# Patient Record
Sex: Male | Born: 1955 | Race: White | Hispanic: No | Marital: Married | Smoking: Former smoker
Health system: Southern US, Community
[De-identification: ages and names within clinical notes are randomized; demographics above are authoritative.]

## PROBLEM LIST (undated history)

## (undated) ENCOUNTER — Emergency Department: Discharge status: Absent without leave | Payer: Medicare HMO

## (undated) DIAGNOSIS — R091 Pleurisy: Secondary | ICD-10-CM

## (undated) DIAGNOSIS — I2510 Atherosclerotic heart disease of native coronary artery without angina pectoris: Secondary | ICD-10-CM

## (undated) DIAGNOSIS — K449 Diaphragmatic hernia without obstruction or gangrene: Secondary | ICD-10-CM

## (undated) DIAGNOSIS — I10 Essential (primary) hypertension: Secondary | ICD-10-CM

## (undated) DIAGNOSIS — E785 Hyperlipidemia, unspecified: Secondary | ICD-10-CM

## (undated) DIAGNOSIS — F1011 Alcohol abuse, in remission: Secondary | ICD-10-CM

## (undated) DIAGNOSIS — T1491XA Suicide attempt, initial encounter: Secondary | ICD-10-CM

## (undated) DIAGNOSIS — R569 Unspecified convulsions: Secondary | ICD-10-CM

## (undated) DIAGNOSIS — I219 Acute myocardial infarction, unspecified: Secondary | ICD-10-CM

## (undated) DIAGNOSIS — F209 Schizophrenia, unspecified: Secondary | ICD-10-CM

## (undated) DIAGNOSIS — D649 Anemia, unspecified: Secondary | ICD-10-CM

## (undated) DIAGNOSIS — J449 Chronic obstructive pulmonary disease, unspecified: Secondary | ICD-10-CM

## (undated) DIAGNOSIS — K635 Polyp of colon: Secondary | ICD-10-CM

## (undated) HISTORY — PX: CORONARY ANGIOPLASTY WITH STENT PLACEMENT: SHX49

## (undated) HISTORY — PX: NOSE SURGERY: SHX723

## (undated) HISTORY — PX: COLONOSCOPY: SHX174

## (undated) HISTORY — PX: EYE SURGERY: SHX253

## (undated) HISTORY — PX: GLAUCOMA SURGERY: SHX656

## (undated) HISTORY — PX: ABDOMINAL SURGERY: SHX537

## (undated) HISTORY — PX: TOE AMPUTATION: SHX809

## (undated) HISTORY — PX: VASECTOMY: SHX75

---

## 2003-08-12 DIAGNOSIS — I219 Acute myocardial infarction, unspecified: Secondary | ICD-10-CM

## 2003-08-12 HISTORY — DX: Acute myocardial infarction, unspecified: I21.9

## 2004-11-29 ENCOUNTER — Emergency Department: Payer: Self-pay | Admitting: Emergency Medicine

## 2004-12-03 ENCOUNTER — Emergency Department: Payer: Self-pay | Admitting: Emergency Medicine

## 2005-05-21 ENCOUNTER — Emergency Department: Payer: Self-pay | Admitting: Emergency Medicine

## 2005-08-27 ENCOUNTER — Emergency Department: Payer: Self-pay | Admitting: Emergency Medicine

## 2005-08-31 ENCOUNTER — Inpatient Hospital Stay: Payer: Self-pay | Admitting: Otolaryngology

## 2005-10-20 ENCOUNTER — Emergency Department: Payer: Self-pay | Admitting: Emergency Medicine

## 2006-08-04 ENCOUNTER — Inpatient Hospital Stay: Payer: Self-pay | Admitting: Psychiatry

## 2006-09-11 ENCOUNTER — Inpatient Hospital Stay: Payer: Self-pay | Admitting: Internal Medicine

## 2006-09-11 ENCOUNTER — Emergency Department: Payer: Self-pay | Admitting: Emergency Medicine

## 2006-09-11 ENCOUNTER — Other Ambulatory Visit: Payer: Self-pay

## 2006-11-07 ENCOUNTER — Other Ambulatory Visit: Payer: Self-pay

## 2006-11-07 ENCOUNTER — Emergency Department: Payer: Self-pay | Admitting: Emergency Medicine

## 2007-03-09 ENCOUNTER — Emergency Department: Payer: Self-pay | Admitting: Emergency Medicine

## 2007-03-09 ENCOUNTER — Other Ambulatory Visit: Payer: Self-pay

## 2007-05-06 ENCOUNTER — Inpatient Hospital Stay: Payer: Self-pay | Admitting: Cardiovascular Disease

## 2007-05-06 ENCOUNTER — Other Ambulatory Visit: Payer: Self-pay

## 2007-05-07 ENCOUNTER — Other Ambulatory Visit: Payer: Self-pay

## 2007-08-08 ENCOUNTER — Emergency Department: Payer: Self-pay | Admitting: Emergency Medicine

## 2007-08-18 ENCOUNTER — Emergency Department: Payer: Self-pay | Admitting: Emergency Medicine

## 2007-09-04 ENCOUNTER — Emergency Department: Payer: Self-pay | Admitting: Emergency Medicine

## 2007-09-06 ENCOUNTER — Inpatient Hospital Stay: Payer: Self-pay | Admitting: Unknown Physician Specialty

## 2007-09-09 ENCOUNTER — Other Ambulatory Visit: Payer: Self-pay

## 2007-09-13 ENCOUNTER — Inpatient Hospital Stay: Payer: Self-pay | Admitting: Internal Medicine

## 2007-09-13 ENCOUNTER — Other Ambulatory Visit: Payer: Self-pay

## 2007-11-23 ENCOUNTER — Emergency Department: Payer: Self-pay | Admitting: Emergency Medicine

## 2007-12-11 ENCOUNTER — Inpatient Hospital Stay: Payer: Self-pay | Admitting: Internal Medicine

## 2007-12-11 ENCOUNTER — Other Ambulatory Visit: Payer: Self-pay

## 2007-12-24 ENCOUNTER — Emergency Department: Payer: Self-pay | Admitting: Emergency Medicine

## 2007-12-27 ENCOUNTER — Emergency Department: Payer: Self-pay | Admitting: Emergency Medicine

## 2008-01-20 ENCOUNTER — Emergency Department: Payer: Self-pay | Admitting: Emergency Medicine

## 2008-01-20 ENCOUNTER — Other Ambulatory Visit: Payer: Self-pay

## 2008-02-05 ENCOUNTER — Emergency Department: Payer: Self-pay | Admitting: Emergency Medicine

## 2008-02-05 ENCOUNTER — Other Ambulatory Visit: Payer: Self-pay

## 2008-02-08 ENCOUNTER — Other Ambulatory Visit: Payer: Self-pay

## 2008-02-08 ENCOUNTER — Inpatient Hospital Stay: Payer: Self-pay | Admitting: Internal Medicine

## 2008-04-13 ENCOUNTER — Emergency Department: Payer: Self-pay | Admitting: Emergency Medicine

## 2008-04-13 ENCOUNTER — Other Ambulatory Visit: Payer: Self-pay

## 2008-05-24 ENCOUNTER — Emergency Department: Payer: Self-pay | Admitting: Emergency Medicine

## 2008-06-06 ENCOUNTER — Emergency Department: Payer: Self-pay | Admitting: Emergency Medicine

## 2008-06-10 ENCOUNTER — Inpatient Hospital Stay: Payer: Self-pay | Admitting: Internal Medicine

## 2009-01-16 ENCOUNTER — Emergency Department: Payer: Self-pay | Admitting: Internal Medicine

## 2009-07-20 ENCOUNTER — Ambulatory Visit: Payer: Self-pay | Admitting: Cardiovascular Disease

## 2009-08-31 ENCOUNTER — Inpatient Hospital Stay: Payer: Self-pay | Admitting: Psychiatry

## 2009-10-02 ENCOUNTER — Inpatient Hospital Stay: Payer: Self-pay | Admitting: Psychiatry

## 2009-10-27 ENCOUNTER — Inpatient Hospital Stay: Payer: Self-pay | Admitting: Internal Medicine

## 2009-11-05 ENCOUNTER — Emergency Department: Payer: Self-pay | Admitting: Emergency Medicine

## 2009-11-14 ENCOUNTER — Emergency Department: Payer: Self-pay | Admitting: Unknown Physician Specialty

## 2010-12-17 ENCOUNTER — Ambulatory Visit: Payer: Self-pay | Admitting: Emergency Medicine

## 2010-12-23 LAB — PATHOLOGY REPORT

## 2014-08-21 ENCOUNTER — Encounter: Payer: Self-pay | Admitting: Surgery

## 2014-08-21 ENCOUNTER — Ambulatory Visit: Payer: Self-pay | Admitting: Surgery

## 2014-09-11 ENCOUNTER — Ambulatory Visit: Payer: Self-pay | Admitting: Surgery

## 2014-09-11 LAB — HEMOGLOBIN A1C: Hemoglobin A1C: 6.3 % (ref 4.2–6.3)

## 2014-09-21 ENCOUNTER — Ambulatory Visit: Payer: Self-pay | Admitting: Surgery

## 2014-09-23 ENCOUNTER — Inpatient Hospital Stay: Payer: Self-pay | Admitting: Internal Medicine

## 2014-10-05 ENCOUNTER — Other Ambulatory Visit: Payer: Self-pay

## 2014-10-12 ENCOUNTER — Other Ambulatory Visit: Payer: Self-pay

## 2014-11-10 ENCOUNTER — Encounter: Payer: Self-pay | Admitting: Surgery

## 2014-11-17 DIAGNOSIS — IMO0002 Reserved for concepts with insufficient information to code with codable children: Secondary | ICD-10-CM | POA: Insufficient documentation

## 2014-12-04 LAB — SURGICAL PATHOLOGY

## 2014-12-10 NOTE — Consult Note (Signed)
Admit Diagnosis:   ACUTE OSTEOMYELITIS OF LT FOOT: Onset Date: 24-Sep-2014, Status: Active, Description: ACUTE OSTEOMYELITIS OF LT FOOT    GERD - Esophageal Reflux:    rectal bleeding:    Hypercholesterolemia:    COPD:    Other, see comments: allergies   Atrial Fibrillation:    Schizophrenia:    Anxiety:    Depression:    HTN:    cyst on neck:    stent:    eye surgery - right:    nasal surgery:   Home Medications: Medication Instructions Status  famotidine 20 mg tablet 1 tab(s) orally 2 times a day x 30 days  Active  Geodon capsule 80 mg 2 cap(s)  once a day (at bedtime) Active  clozapine tablet 100 mg 3 tab(s) orally once a day (at bedtime)  Active  Protonix enteric coated tablet 40 mg 1 tab(s) orally once a day (at bedtime)  Active  Paroxetine HCL 40mg  PO at bedtime    Active  Simvastatin 40mg  PO at bedtime  Active  Diltiazem HLC ER 360mg  PO daily  Active  Lisinopril 20mg  PO q AM  Active  ASA 325mg  EC PO daily  Active  Carvedilol 25mg  PO BID  Active  traMADol 50 mg oral tablet 2 tab(s) orally once a day (at bedtime) Active  nicotine 21 mg/24 hr transdermal film, extended release 1 patch transdermal once a day Active   Lab Results: Routine Micro:  13-Feb-16 19:41   Micro Text Report BLOOD CULTURE   COMMENT                   NO GROWTH IN 8-12 HOURS   ANTIBIOTIC                       Micro Text Report BLOOD CULTURE   COMMENT                   NO GROWTH IN 8-12 HOURS   ANTIBIOTIC                       Routine Chem:  14-Feb-16 03:46   Glucose, Serum 98  BUN 12  Creatinine (comp)  1.57  Potassium, Serum 3.8  Routine Hem:  13-Feb-16 19:25   WBC (CBC)  25.2  14-Feb-16 03:46   WBC (CBC)  19.0  Hemoglobin (CBC)  8.8  Hematocrit (CBC)  27.4  Neutrophil % 87.5  Neutrophil #  16.7   Radiology Results:  Radiology Results: XRay:    13-Feb-16 19:45, Foot Left Complete  Foot Left Complete  REASON FOR EXAM:    left 1-3 toe infection  COMMENTS:    Bedside (portable):Y    PROCEDURE: DXR - DXR FOOT LT COMP W/OBLIQUES  - Sep 23 2014  7:45PM     CLINICAL DATA:  Swelling, discoloration and drainage from the first  through third digits.    EXAM:  LEFT FOOT - COMPLETE 3+ VIEW    COMPARISON:  MRI 09/21/2014    FINDINGS:  There is bony destruction involving the second middle phalanx and  distal portion of the proximal phalanx with frank destruction of the  PIP joint. The findings are typical of osteomyelitis and septic  arthritis and are consistent with the MR findings from 09/21/2014.  No radiopaque foreign body is evident. No new areas of involvement  are evident compared to the recent MRI. Chronic hypertrophy and  degeneration of the first MTP joint incidentally noted.  There is marked soft tissue swelling at the dorsum of the forefoot.  There is no soft tissue gas.     IMPRESSION:  Osteomyelitis and septic arthritis, second PIP.      Electronically Signed    By: Judie Grieve.D.    On: 09/23/2014 19:57         Verified By: Ellery Plunk, M.D.,  MRI:    11-Feb-16 16:13, MRI Foot Left WWO  MRI Foot Left WWO  REASON FOR EXAM:    swelling of toes erythema  non healing wounds    exposed bone  COMMENTS:       PROCEDURE: MR  - MR FOOT LT  WO/W CONTRAST  - Sep 21 2014  4:13PM     CLINICAL DATA:  No known injury. Open sore on second toe and a small  sore on thegreat toe. Generalized pain for 5 months.    EXAM:  MRI OF THE LEFT FOREFOOT WITHOUT AND WITH CONTRAST    TECHNIQUE:  Multiplanar, multisequence MR imaging was performed both before and  after administration of intravenous contrast.  CONTRAST:  18 mLMultiHance.    COMPARISON:  None.    FINDINGS:  There is severe soft tissue edema with enhancement surrounding the  second toe. There is bone destruction with severe edema and  enhancement involving the second proximal and middle phalanx with  destructive changes of the second PIP joint. There is  mild marrow  edema in the second distal phalanx which is likely reactive without  bone destruction.    There is no focal marrow signal abnormality of the first phalanx.  There is no bone destruction. There is no joint effusion. There is  partial thickness cartilage loss with subchondral cystic changes  involving the lateral tibial plafond. 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 There is mild osteoarthritis  at the navicular -medialcuneiform articulation. There is no other  focal marrow signal abnormality. There is no fracture or  dislocation. The sinus tarsi and visualized subtalar joints are  normal.    The muscles are normal in signal. The visualized flexor, extensor  and peroneal tendons are intact.     IMPRESSION:  1. Severe cellulitis of the second toe of the left foot with  osteomyelitis of the second proximal and middle phalanx and septic  arthritis of the second PIP joint.  2. No osteomyelitis of the great toe.  Electronically Signed    By: Elige Ko    On: 09/22/2014 09:20         Verified By: Joellyn Haff, M.D., MD    Compazine: Other  Depakote: Other  Nursing Flowsheets: **Vital Signs.:   14-Feb-16 16:16  Temperature Temperature (F) 97.3    General Aspect Pt admitted after syncopal episode.  Has had a couple of day of chills.  C/O pain to left 2nd toe.  Seen outpt at podiatry and wound care center.  Worsening swelling to 2nd toe.  Upon admission has noted ulceration and xrays and MRI show osteomyelitis of left 2nd toe.   Case History and Physical Exam:  Cardiovascular Palpable DP and PT pulsed b/l   Musculoskeletal Right foot without issue.  Left 2nd toe with edema and instability at PIPJ. Pain with palpation.  Mild edema to left foot.   Neurological Grossly neuropathic   Skin Ulcer 2nd toe with drainage lateral at PIPJ.  Diffuse cellulitis dorsal from 2nd toe to MPJ.  No lymphangitis.    Impression Osteomyelitis 2nd toe  secondary  to diabetic neuropathy and diabetic ulceration.   Plan Will need amputation of 2nd toe. D/W pt and will plan for tomorrow. NPO after midnight.   Electronic Signatures: Gwyneth Revels (MD)  (Signed 14-Feb-16 17:25)  Authored: Health Issues, Significant Events - History, Home Medications, Labs, Radiology Results, Allergies, Vital Signs, General Aspect/Present Illness, History and Physical Exam, Impression/Plan   Last Updated: 14-Feb-16 17:25 by Gwyneth Revels (MD)

## 2014-12-10 NOTE — H&P (Signed)
PATIENT NAME:  Brandon [name], Brandon [name] MR#:  [id number] DATE OF BIRTH:  [birth year]  DATE OF ADMISSION:  09/23/2014  PRIMARY CARE PHYSICIAN: Marland Mcalpine A. Tejan-Sie, MD  CHIEF COMPLAINT: "Passed out."  HISTORY OF PRESENT ILLNESS: A 59 year old Caucasian gentleman with history of COPD, non-O2 dependent; atrial fibrillation, paroxysmal, not on any anticoagulation; coronary artery disease, status post PCI and stent placement, presenting after a syncopal episode. The patient states he was at church and went from a sitting to standing position and felt light-headed, followed by loss of consciousness (brief episode). No postictal confusion. No loss of bowel or bladder function. This was witnessed, and then he returned back to his usual state of health. Of note, he has been recently following at the wound clinic for about 1 to 2 weeks in total for a left foot wound. He noticed recent worsening with increased redness, edema, as well as purulent drainage from the second toe, as well as subjective chills. Denies any fevers.   REVIEW OF SYSTEMS:  CONSTITUTIONAL: Denies fever. Positive for chills, fatigue, weakness.  EYES: Denies blurred vision, double vision, or eye pain.  EARS, NOSE, THROAT: Denies tinnitus, ear pain, hearing loss.  RESPIRATORY: Denies cough, wheeze, shortness of breath.  CARDIOVASCULAR: Denies chest pain or palpitations. Positive for syncopal episode as described above.  GASTROINTESTINAL: Denies nausea, vomiting, diarrhea, or abdominal pain.  GENITOURINARY: Denies dysuria or hematuria.  ENDOCRINE: Denies nocturia or thyroid problems.  HEMATOLOGIC AND LYMPHATIC: Denies easy bruising or bleeding.  SKIN: Positive for an erythematous lesion surrounding the second and third toes extending to about half way up the dorsum of the left foot as stated above with purulent discharge. Otherwise, no further rashes or lesions.  MUSCULOSKELETAL: Denies pain in neck, back, shoulder, knees, hips, or arthritic symptoms.   NEUROLOGIC: Denies paralysis or paresthesias.  PSYCHIATRIC: Denies anxiety or depressive symptoms.   Otherwise, full review of systems performed by me is negative.   PAST MEDICAL HISTORY: Includes COPD, non-O2 dependent; hyperlipidemia, unspecified; paroxysmal atrial fibrillation, not on anticoagulation; schizophrenia, anxiety, depression, not otherwise specified; essential hypertension; coronary artery disease, status post PCI and stent placement.   SOCIAL HISTORY: Remote tobacco use. Denies any alcohol or drug use.   FAMILY HISTORY: Positive for diabetes as well as hypertension.   ALLERGIES: COMPAZINE AND DEPAKOTE.   HOME MEDICATIONS: Include clozapine 100 mg 3 tablets p.o. at bedtime, Pepcid 20 mg p.o. b.i.d., Protonix 40 mg p.o. at bedtime, aspirin 325 mg p.o. daily, Coreg 25 mg p.o. b.i.d., diltiazem extended release 360 mg p.o. daily, Feosol 325 mg p.o. b.i.d., Geodon 80 mg p.o. b.i.d., lisinopril 20 mg p.o. daily, meloxicam 15 mg p.o. daily, paroxetine 40 mg p.o. at bedtime, as well as simvastatin 40 mg p.o. at bedtime.   PHYSICAL EXAMINATION:  VITAL SIGNS: Temperature 98.5, heart rate 83, respirations 18, blood pressure 96/62, saturating 100% on room air. Weight 86.2 kg, BMI 25.8.  GENERAL: Well-nourished, well-developed Caucasian gentleman currently in no acute distress.  HEAD: Normocephalic, atraumatic.  EYES: Pupils are equal, round, and reactive to light. Extraocular muscles intact. No scleral icterus.  MOUTH: Dry mucosal membrane. Dentition intact. No abscess noted.  EARS, NOSE, THROAT: Clear without exudates. No external lesions.  NECK: Supple. No thyromegaly. No nodules. No JVD.  PULMONARY: Clear to auscultation bilaterally without wheezes, rubs, or rhonchi. No use of accessory muscles. Good respiratory effort.  CHEST: Nontender to palpation.  CARDIOVASCULAR: S1, S2, regular rate and rhythm. No murmurs, rubs, or gallops. No edema. Pedal pulses 2+ bilaterally.  GASTROINTESTINAL: Soft, nontender, nondistended. No masses. Positive bowel sounds. No hepatosplenomegaly.  MUSCULOSKELETAL: Left foot starting at the second digit moving proximally about half way up the dorsum of the left foot reveals edema with associated erythema. Warm to touch. Otherwise, range of motion is intact. No clubbing or further abnormalities.  NEUROLOGIC: Cranial nerves II through XII intact. No gross focal neurological deficits. Sensation intact. Reflexes intact.  SKIN: Second digit of the left toe with gross deformity with purulent discharge. The medial portion of the left toe has surrounding erythema and edema as stated above, including the second and third digit moving proximally about half of the dorsum of the foot. Otherwise, no further lesions. Skin turgor intact.  PSYCHIATRIC: Mood and affect within normal limits. The patient is awake, alert, oriented x 3. Insight and judgment intact.   LABORATORY DATA: The MRI which was performed on September 21, 2014, reveals severe cellulitis of the second toe of the left foot with osteomyelitis of the second proximal and mid phalanx and septic arthritis of the second PIP joint. Remainder of laboratory data: Sodium 129, potassium 3.9, chloride 99, bicarbonate of 19, anion gap corrected to 14.75, BUN 16, creatinine of 2, glucose of 89. Hemoglobin A1c performed on February 1 was 6.3. LFTs: Protein of 6.2, albumin 2.5. WBC of 25.2, hemoglobin 9.3, platelets of 387,000. Lactic acid 1.6.   ASSESSMENT AND PLAN: A 59 year old Caucasian gentleman with a history of chronic obstructive pulmonary disease, non-oxygen dependent; atrial fibrillation, paroxysmal, not on anticoagulation; coronary artery disease, status post percutaneous coronary intervention and stent placement, presenting after a syncopal episode.  1.  Osteomyelitis, left foot, acute. Apparently, he is on some p.o. antibiotics at home. Unsure of what these are. Regardless, it appears to be  worsening. Initiate blood cultures. Intravenous antibiotic coverage with vancomycin and Zosyn. His hemoglobin A1c was recently checked at 6.3. We will consult podiatry for either debridement versus amputation.  2.  Syncope. Intravenous fluid hydration, place on telemetry.  3.  Acute kidney injury, unknown baseline. Regardless, intravenous fluid hydration. Follow urine output and renal function.  4.  Hyponatremia: Intravenous fluids--thus far received about 3.5 liters of normal saline. Regardless, we will continue intravenous fluid hydration for blood pressure support and follow sodium levels accordingly.  5.  Atrial fibrillation, paroxysmal, currently in normal sinus rhythm. Continue his beta blockade and calcium channel blockers.  6.  Hypertension, essential. Hold his angiotensin-converting enzyme inhibitor given acute kidney injury. 7.  Venous thromboembolism prophylaxis with sequential compression devices. Hold heparin in preparation for possible surgery.   CODE STATUS: The patient is a FULL CODE.   TIME SPENT: 45 minutes.     ____________________________ Cletis Athensavid K. Hower, MD dkh:ts D: 09/23/2014 21:08:00 ET T: 09/23/2014 21:39:45 ET JOB#: [id number]  cc: Cletis Athensavid K. Hower, MD, <Dictator> DAVID Synetta ShadowK HOWER MD ELECTRONICALLY SIGNED 09/24/2014 20:44

## 2014-12-10 NOTE — Op Note (Signed)
PATIENT NAME:  Brandon [name], Brandon [name] MR#:  [id number] DATE OF BIRTH:  1956-01-21  DATE OF PROCEDURE:  09/25/2014   PREOPERATIVE DIAGNOSIS:  Left second toe osteomyelitis.   POSTOPERATIVE DIAGNOSIS: Left second toe osteomyelitis.   PROCEDURE: Amputation left second toe metatarsophalangeal joint.   SURGEON: Yoniel Arkwright A. Ether GriffinsFowler, DPM.   ANESTHESIA: IV sedation with local.   HEMOSTASIS: None.   COMPLICATIONS: None.   SPECIMEN: Infected osteomyelitis, left second toe.   OPERATIVE INDICATIONS: This is a 59 year old gentleman with an ulcer to his left second toe with a draining abscess from the PIPJ with osteomyelitis on both an x-ray and MRI.  He was brought from the orthopedic floor to the OR for surgical amputation of the left second toe.  The risks, benefits, alternatives, and complications associated with surgery were discussed with the patient and full consent has been given.   OPERATIVE PROCEDURE: The patient was brought into the OR and placed on the operating table in the supine position. IV sedation was administered by the anesthesia team. A local block was placed around the second toe joint by myself.  After sterile prep and drape, attention was directed to the second MTPJ where 2 semielliptical incisions were made circumferential around the second toe itself.  A full-thickness incision was taken down to the MTPJ. The toe was then disarticulated at the MTPJ.  Inspection of the metatarsal head did not reveal any further erosive changes to the second toe metatarsal.  The cartilage cap was intact. No surrounding necrotic tissue was noted at this time. The wound was then flushed with copious amounts of irrigation. Layered closure was performed with a 4-0 Vicryl for the deeper layer and a 3-0 nylon for skin. A central portion of the wound was packed with Iodoform packing strip.  A 0.5% Marcaine block was placed around all areas. The patient tolerated the procedure and anesthesia well and was transported  from the OR to the PACU with all vital signs stable and neurovascular status intact. He will be readmitted to the floor. We will monitor the surgical site. He is on IV antibiotics by internal medicine.  Hopefully, I can discharge him in the next 1 to 2 days if he stabilizes.     ____________________________ Argentina DonovanJustin A. Ether GriffinsFowler, DPM jaf:DT D: 09/25/2014 18:34:30 ET T: 09/25/2014 19:32:41 ET JOB#: [id number]  cc: Jill AlexandersJustin A. Ether GriffinsFowler, DPM, <Dictator> Kenzlie Disch DPM ELECTRONICALLY SIGNED 09/29/2014 9:49

## 2014-12-10 NOTE — Discharge Summary (Signed)
PATIENT NAME:  Brandon [name], Brandon [name] MR#:  [id number] DATE OF BIRTH:  12-09-1955  DATE OF ADMISSION:  09/23/2014 DATE OF DISCHARGE:  09/29/2014  ADMITTING DIAGNOSES: 1.  Left foot osteomyelitis.  2.  Syncope.  3.  Acute kidney injury.  4.  Hyponatremia.  5.  Paroxysmal atrial fibrillation.  6.  Hypertension.   DISCHARGE DIAGNOSES: 1.  Left second toe osteomyelitis status post amputation.  2.  Syncope secondary to orthostatic hypotension, clinically resolved. 3.  Acute kidney injury, resolved.  4.  Hyponatremia, improved.  5.  Paroxysmal atrial fibrillation. Small dose of Cardizem is added to the regimen. 6.  Hypertension. Blood pressure is low, holding Coreg and Cardizem dose is changed down to 120 mg.   CODE STATUS: FULL.  CONSULTATIONS: Podiatry.   PROCEDURES: On February 15th the patient had left second toe amputation under MAC anesthesia.   BRIEF HISTORY AND PHYSICAL AND HOSPITAL COURSE BASED ON PROBLEM: 1.  Left foot osteomyelitis. The patient was using antibiotics as an outpatient with no significant improvement. As his infection is getting worse, he came into the ED. Blood cultures were obtained. IV vancomycin and Zosyn were given. Hemoglobin A1c recently checked revealed 6.3. Podiatry was consulted. The patient was seen by Dr. Ether GriffinsFowler, and the patient had left second toe amputation done on feb 15th. According to podiatry, the old margins were completely dissected and was sent to pathology. Culture results have revealed Staphylococcus aureus, group B streptococcus and rare gram-negative rods. I have discussed with Dr. Alberteen Spindleline who has recommended a 10 day course of Augmentin as the osteomyelitic toe is completely removed. The patient'[name] clinical situation significantly improved and white count trended down from 25.2 to 14. Dressing care as per podiatry and follow up with podiatry in a week, and [facility].  2.  Syncope, probably from  orthostatic hypotension from acute kidney injury from dehydration. The patient'[name] orthostatic blood pressures on 17th February, 105/79 while lying down and standing 79/40, and the patient was symptomatic at that time. Aggressive hydration with IV fluids was given. His Coreg was discontinued and Cardizem was also held initially. Eventually the patient started feeling better. His dizziness is completely gone. Worked with physical therapy. Denies any symptoms today. Still the patient'[name] orthostatic blood pressure was positive with lying blood pressure at 113/95 and standing 99/66, but the patient is absolutely asymptomatic and denies any dizziness or chest pain or shortness of breath.  3.  Acute kidney injury. Resolved with IV fluids. The patient'[name] renal function is back to normal.  4.  Hyponatremia. The patient'[name] sodium is back to normal at 139 on 15th of February.  5.  Hypertension. Blood pressure is soft at low normal range so Coreg is held and Cardizem dose is reduced to 120 mg from 360.  6.  Paroxysmal atrial fibrillation, rate controlled. Continue Cardizem, lower dose. The patient will be on aspirin 325 mg for anticoagulation.  7.  History of coronary artery disease, status post stent. Continue aspirin and statin, but holding Coreg secondary to hypotension.  8.  Schizophrenia and depression. Continues Clozapine, Paxil. 9.  For constipation will continue bowel regimen with Colace.   DISPOSITION: The patient is getting discharged to SNF for rehabilitation.   PHYSICAL EXAMINATION: VITAL SIGNS: Today temperature is 98.1, pulse 95 to 98, respirations 18, blood pressure 114/77 with pulse ox at 93% room air.  GENERAL APPEARANCE: Not in acute distress. Moderately built and nourished.  HEENT: Normocephalic, atraumatic. Pupils  are equally reactive to light and accommodation. No scleral icterus. No conjunctival injection. No sinus tenderness. No postnasal drip. Moist mucous membranes.  NECK: Supple. No JVD. No  thyromegaly. No masses are noted.  LUNGS: Clear to auscultation bilaterally. No accessory muscle use and no anterior chest wall tenderness on palpation.  CARDIOVASCULAR: Irregularly irregular. No murmurs.  GASTROINTESTINAL: Soft. Bowel sounds are positive in all 4 quadrants. Nontender, nondistended. No masses felt.  NEUROLOGIC: Awake, alert, oriented x3. Flat mood.  EXTREMITIES: Left toe is status post amputation, in clean dressing. Otherwise no rashes. No ulcers are noticed.  DIAGNOSTIC DATA: On February 16th, white count is at 14, hemoglobin 8.9, hematocrit 27.9, and platelets are at 496,000. Wound culture from the amputated left second toe has revealed moderate growth of Streptococcus agalactiae, group B, and moderate Staphylococcus aureus and rare Enterobacter cloacae SSP cloacae. After reviewing the culture results, and as per my discussion with podiatry, Dr. Alberteen Spindle, he has recommended Augmentin 500 mg p.o. b.i.d. for 10 days only. He thinks Augmentin is enough as the patient'[name] osteomyelitic toe is completely amputated. Blood culture has revealed no growth in 5 days, x2. Urine culture has revealed mixed bacterial organism which is suggesting contamination.   DISCHARGE MEDICATIONS: Augmentin 500 mg p.o. q. 12 hours for 10 days, Colace 100 mg p.o. 2 times a day, ziprasidone 80 mg 1 capsule p.o. once daily, aspirin 325 mg once daily, simvastatin 40 mg p.o. at bedtime, diltiazem 120 mg extended release once daily, paroxetine 40 mg p.o. once daily, Tylenol 500 mg 1 tablet p.o. every 6 to 8 hours as needed for mild pain or temperature greater than 100.4, Percocet 325/5 one tablet p.o. every 6 hours as needed for moderate pain and 2 tablets every 6 hours as needed for severe pain, clozapine 100 mg 3 tablets p.o. at bedtime, tramadol 50 mg 2 tablets p.o. at bedtime, nicotine 21 mg patch to continue transdermally once daily, famotidine 20 mg p.o. 2 times a day, Protonix 40 mg p.o. once daily. Discontinued Coreg,  lisinopril 20 mg and diltiazem 360 mg.  DRESSING CARE: Per podiatry.   DISCHARGE DIET: Low-sodium, regular consistency.   DISCHARGE ACTIVITY: As tolerated, as recommended by physical therapy.   DISCHARGE FOLLOWUP: Follow up with primary care physician in 1 to 2 days and podiatry in 1 week as recommended by them.  The diagnosis and plan of care was discussed in detail with the patient. He verbalized understanding of the plan.   TOTAL TIME SPENT ON THE DISCHARGE AND COORDINATION OF CARE: 45 minutes.  ____________________________ Ramonita Lab, MD ag:sb D: 09/29/2014 12:10:43 ET T: 09/29/2014 12:45:59 ET JOB#: [id number]  cc: Ramonita Lab, MD, <Dictator> Silas Flood. Ellsworth Lennox, MD Argentina Donovan Ether Griffins, DPM Linus Galas, DPM Ramonita Lab MD ELECTRONICALLY SIGNED 10/06/2014 15:06

## 2015-04-19 ENCOUNTER — Emergency Department
Admission: EM | Admit: 2015-04-19 | Discharge: 2015-04-19 | Disposition: A | Discharge status: Home / Self Care | Payer: Medicare Other | Attending: Emergency Medicine | Admitting: Emergency Medicine

## 2015-04-19 ENCOUNTER — Encounter: Payer: Self-pay | Admitting: Emergency Medicine

## 2015-04-19 DIAGNOSIS — Z87891 Personal history of nicotine dependence: Secondary | ICD-10-CM | POA: Diagnosis not present

## 2015-04-19 DIAGNOSIS — I10 Essential (primary) hypertension: Secondary | ICD-10-CM | POA: Diagnosis not present

## 2015-04-19 DIAGNOSIS — E860 Dehydration: Secondary | ICD-10-CM | POA: Insufficient documentation

## 2015-04-19 DIAGNOSIS — R42 Dizziness and giddiness: Secondary | ICD-10-CM | POA: Diagnosis present

## 2015-04-19 HISTORY — DX: Essential (primary) hypertension: I10

## 2015-04-19 HISTORY — DX: Acute myocardial infarction, unspecified: I21.9

## 2015-04-19 HISTORY — DX: Schizophrenia, unspecified: F20.9

## 2015-04-19 HISTORY — DX: Alcohol abuse, in remission: F10.11

## 2015-04-19 LAB — URINALYSIS COMPLETE WITH MICROSCOPIC (ARMC ONLY)
BACTERIA UA: NONE SEEN
Bilirubin Urine: NEGATIVE
GLUCOSE, UA: NEGATIVE mg/dL
HGB URINE DIPSTICK: NEGATIVE
KETONES UR: NEGATIVE mg/dL
NITRITE: NEGATIVE
Protein, ur: NEGATIVE mg/dL
RBC / HPF: NONE SEEN RBC/hpf (ref 0–5)
SPECIFIC GRAVITY, URINE: 1.006 (ref 1.005–1.030)
pH: 7 (ref 5.0–8.0)

## 2015-04-19 LAB — CBC
HCT: 35.6 % — ABNORMAL LOW (ref 40.0–52.0)
HEMOGLOBIN: 11.5 g/dL — AB (ref 13.0–18.0)
MCH: 26.6 pg (ref 26.0–34.0)
MCHC: 32.3 g/dL (ref 32.0–36.0)
MCV: 82.3 fL (ref 80.0–100.0)
Platelets: 354 10*3/uL (ref 150–440)
RBC: 4.32 MIL/uL — AB (ref 4.40–5.90)
RDW: 14.9 % — ABNORMAL HIGH (ref 11.5–14.5)
WBC: 13.2 10*3/uL — AB (ref 3.8–10.6)

## 2015-04-19 LAB — BASIC METABOLIC PANEL
ANION GAP: 8 (ref 5–15)
BUN: 14 mg/dL (ref 6–20)
CHLORIDE: 102 mmol/L (ref 101–111)
CO2: 24 mmol/L (ref 22–32)
Calcium: 9.2 mg/dL (ref 8.9–10.3)
Creatinine, Ser: 1.5 mg/dL — ABNORMAL HIGH (ref 0.61–1.24)
GFR calc non Af Amer: 49 mL/min — ABNORMAL LOW (ref >60)
GFR, EST AFRICAN AMERICAN: 57 mL/min — AB (ref >60)
Glucose, Bld: 97 mg/dL (ref 65–99)
POTASSIUM: 4.5 mmol/L (ref 3.5–5.1)
SODIUM: 134 mmol/L — AB (ref 135–145)

## 2015-04-19 LAB — TROPONIN I: Troponin I: <0.03 ng/mL (ref <0.031)

## 2015-04-19 MED ORDER — SODIUM CHLORIDE 0.9 % IV SOLN
Freq: Once | INTRAVENOUS | Status: AC
  Administered 2015-04-19: 16:00:00 via INTRAVENOUS

## 2015-04-19 NOTE — ED Provider Notes (Signed)
[facility] Emergency Department Provider Note     Time seen: ----------------------------------------- 2:58 PM on 04/19/2015 -----------------------------------------    I have reviewed the triage vital signs and the nursing notes.   HISTORY  Chief Complaint dehydration     HPI Brandon [name] is a 59 y.o. male who presents ER for feeling dizzy and lightheaded for the last couple days. Patient denies any pain currently.Patient states he was sent by his primary care doctor to receive IV fluids. Patient has not had nausea vomiting or diarrhea, he has been able to keep liquids down.   Past Medical History  Diagnosis Date  . Schizophrenia   . Hypertension   . Heart attack   . History of ETOH abuse     There are no active problems to display for this patient.   Past Surgical History  Procedure Laterality Date  . Coronary angioplasty with stent placement    . Abdominal surgery      internal bleeding    Allergies Benadryl; Compazine; and Dramamine  Social History Social History  Substance Use Topics  . Smoking status: Former Games developer  . Smokeless tobacco: None  . Alcohol Use: No    Review of Systems Constitutional: Negative for fever. Eyes: Negative for visual changes. ENT: Negative for sore throat. Cardiovascular: Negative for chest pain. Respiratory: Negative for shortness of breath. Gastrointestinal: Negative for abdominal pain, vomiting and diarrhea. Genitourinary: Negative for dysuria. Musculoskeletal: Negative for back pain. Skin: Negative for rash. Neurological: Negative for headaches, focal weakness or numbness. Patient complains of mild dizziness  10-point ROS otherwise negative.  ____________________________________________   PHYSICAL EXAM:  VITAL SIGNS: ED Triage Vitals  Enc Vitals Group     BP 04/19/15 1328 106/74 mmHg     Pulse Rate 04/19/15 1328 82     Resp 04/19/15 1328 18     Temp 04/19/15 1328 98.4 F (36.9  C)     Temp Source 04/19/15 1328 Oral     SpO2 04/19/15 1328 97 %     Weight 04/19/15 1328 187 lb (84.823 kg)     Height 04/19/15 1328 6' (1.829 m)     Head Cir --      Peak Flow --      Pain Score --      Pain Loc --      Pain Edu? --      Excl. in GC? --     Constitutional: Alert and oriented. Well appearing and in no distress. Eyes: Conjunctivae are normal. PERRL. Normal extraocular movements. ENT   Head: Normocephalic and atraumatic.   Nose: No congestion/rhinnorhea.   Mouth/Throat: Mucous membranes are moist.   Neck: No stridor. Cardiovascular: Normal rate, regular rhythm. Normal and symmetric distal pulses are present in all extremities. No murmurs, rubs, or gallops. Respiratory: Normal respiratory effort without tachypnea nor retractions. Breath sounds are clear and equal bilaterally. No wheezes/rales/rhonchi. Gastrointestinal: Soft and nontender. No distention. No abdominal bruits.  Musculoskeletal: Nontender with normal range of motion in all extremities. No joint effusions.  No lower extremity tenderness nor edema. Neurologic:  Normal speech and language. No gross focal neurologic deficits are appreciated. Speech is normal. No gait instability. Skin:  Skin is warm, dry and intact. No rash noted. Psychiatric: Mood and affect are normal. Speech and behavior are normal. Patient exhibits appropriate insight and judgment. ____________________________________________  EKG: Interpreted by me. Normal sinus rhythm with a rate of 83 bpm, abnormal T waves throughout, normal axis. No evidence of hypertrophy  or acute infarction.  ____________________________________________  ED COURSE:  Pertinent labs & imaging results that were available during my care of the patient were reviewed by me and considered in my medical decision making (see chart for details). Patient received saline bolus, will reevaluate. ____________________________________________    LABS (pertinent  positives/negatives)  Labs Reviewed  BASIC METABOLIC PANEL - Abnormal; Notable for the following:    Sodium 134 (*)    Creatinine, Ser 1.50 (*)    GFR calc non Af Amer 49 (*)    GFR calc Af Amer 57 (*)    All other components within normal limits  CBC - Abnormal; Notable for the following:    WBC 13.2 (*)    RBC 4.32 (*)    Hemoglobin 11.5 (*)    HCT 35.6 (*)    RDW 14.9 (*)    All other components within normal limits  URINALYSIS COMPLETEWITH MICROSCOPIC ([facility] ONLY) - Abnormal; Notable for the following:    Color, Urine YELLOW (*)    APPearance CLEAR (*)    Leukocytes, UA 1+ (*)    Squamous Epithelial / LPF 0-5 (*)    All other components within normal limits  TROPONIN I  CBG MONITORING, ED   ____________________________________________  FINAL ASSESSMENT AND PLAN  Dehydration  Plan: Patient with labs and imaging as dictated above. Patient was advised of the need to have his renal function rechecked. EKG was unchanged from February of this year, he is stable for outpatient follow-up with his doctor.   Emily Filbert, MD   Emily Filbert, MD 04/19/15 [phone]

## 2015-04-19 NOTE — Discharge Instructions (Signed)
Dehydration, Adult Dehydration is when you lose more fluids from the body than you take in. Vital organs like the kidneys, brain, and heart cannot function without a proper amount of fluids and salt. Any loss of fluids from the body can cause dehydration.  CAUSES   Vomiting.  Diarrhea.  Excessive sweating.  Excessive urine output.  Fever. SYMPTOMS  Mild dehydration  Thirst.  Dry lips.  Slightly dry mouth. Moderate dehydration  Very dry mouth.  Sunken eyes.  Skin does not bounce back quickly when lightly pinched and released.  Dark urine and decreased urine production.  Decreased tear production.  Headache. Severe dehydration  Very dry mouth.  Extreme thirst.  Rapid, weak pulse (more than 100 beats per minute at rest).  Cold hands and feet.  Not able to sweat in spite of heat and temperature.  Rapid breathing.  Blue lips.  Confusion and lethargy.  Difficulty being awakened.  Minimal urine production.  No tears. DIAGNOSIS  Your caregiver will diagnose dehydration based on your symptoms and your exam. Blood and urine tests will help confirm the diagnosis. The diagnostic evaluation should also identify the cause of dehydration. TREATMENT  Treatment of mild or moderate dehydration can often be done at home by increasing the amount of fluids that you drink. It is best to drink small amounts of fluid more often. Drinking too much at one time can make vomiting worse. Refer to the home care instructions below. Severe dehydration needs to be treated at the hospital where you will probably be given intravenous (IV) fluids that contain water and electrolytes. HOME CARE INSTRUCTIONS   Ask your caregiver about specific rehydration instructions.  Drink enough fluids to keep your urine clear or pale yellow.  Drink small amounts frequently if you have nausea and vomiting.  Eat as you normally do.  Avoid:  Foods or drinks high in sugar.  Carbonated  drinks.  Juice.  Extremely hot or cold fluids.  Drinks with caffeine.  Fatty, greasy foods.  Alcohol.  Tobacco.  Overeating.  Gelatin desserts.  Wash your hands well to avoid spreading bacteria and viruses.  Only take over-the-counter or prescription medicines for pain, discomfort, or fever as directed by your caregiver.  Ask your caregiver if you should continue all prescribed and over-the-counter medicines.  Keep all follow-up appointments with your caregiver. SEEK MEDICAL CARE IF:  You have abdominal pain and it increases or stays in one area (localizes).  You have a rash, stiff neck, or severe headache.  You are irritable, sleepy, or difficult to awaken.  You are weak, dizzy, or extremely thirsty. SEEK IMMEDIATE MEDICAL CARE IF:   You are unable to keep fluids down or you get worse despite treatment.  You have frequent episodes of vomiting or diarrhea.  You have blood or green matter (bile) in your vomit.  You have blood in your stool or your stool looks black and tarry.  You have not urinated in 6 to 8 hours, or you have only urinated a small amount of very dark urine.  You have a fever.  You faint. MAKE SURE YOU:   Understand these instructions.  Will watch your condition.  Will get help right away if you are not doing well or get worse. Document Released: 07/28/2005 Document Revised: 10/20/2011 Document Reviewed: 03/17/2011 ExitCare Patient Information 2015 [employer]. This information is not intended to replace advice given to you by your health care provider. Make sure you discuss any questions you have with your health care   provider.  

## 2015-04-19 NOTE — ED Notes (Signed)
Patient has been feeling dizzy and lightheaded for a couple days.  Denies any pain.  Poor historian.  Has cardiac hx.  PCP told patient he needed fluids.

## 2015-04-19 NOTE — ED Notes (Signed)
Patient went to PCP yesterday for dizziness.  Was told by PCP that he needs IV fluids.  Denies any pain or SHOB.

## 2016-05-09 ENCOUNTER — Encounter: Payer: Self-pay | Admitting: Anesthesiology

## 2016-05-09 ENCOUNTER — Ambulatory Visit: Payer: Medicare HMO | Admitting: Anesthesiology

## 2016-05-09 ENCOUNTER — Ambulatory Visit
Admission: RE | Admit: 2016-05-09 | Discharge: 2016-05-09 | Disposition: A | Discharge status: Home / Self Care | Payer: Medicare HMO | Source: Ambulatory Visit | Attending: Unknown Physician Specialty | Admitting: Unknown Physician Specialty

## 2016-05-09 ENCOUNTER — Encounter
Admission: RE | Disposition: A | Discharge status: Home / Self Care | Payer: Self-pay | Source: Ambulatory Visit | Attending: Unknown Physician Specialty

## 2016-05-09 DIAGNOSIS — D509 Iron deficiency anemia, unspecified: Secondary | ICD-10-CM | POA: Insufficient documentation

## 2016-05-09 DIAGNOSIS — F209 Schizophrenia, unspecified: Secondary | ICD-10-CM | POA: Insufficient documentation

## 2016-05-09 DIAGNOSIS — Z87891 Personal history of nicotine dependence: Secondary | ICD-10-CM | POA: Insufficient documentation

## 2016-05-09 DIAGNOSIS — Z89429 Acquired absence of other toe(s), unspecified side: Secondary | ICD-10-CM | POA: Diagnosis not present

## 2016-05-09 DIAGNOSIS — K2950 Unspecified chronic gastritis without bleeding: Secondary | ICD-10-CM | POA: Insufficient documentation

## 2016-05-09 DIAGNOSIS — K210 Gastro-esophageal reflux disease with esophagitis: Secondary | ICD-10-CM | POA: Diagnosis not present

## 2016-05-09 DIAGNOSIS — Z955 Presence of coronary angioplasty implant and graft: Secondary | ICD-10-CM | POA: Insufficient documentation

## 2016-05-09 DIAGNOSIS — Z538 Procedure and treatment not carried out for other reasons: Secondary | ICD-10-CM | POA: Diagnosis not present

## 2016-05-09 DIAGNOSIS — I252 Old myocardial infarction: Secondary | ICD-10-CM | POA: Insufficient documentation

## 2016-05-09 DIAGNOSIS — I2510 Atherosclerotic heart disease of native coronary artery without angina pectoris: Secondary | ICD-10-CM | POA: Diagnosis not present

## 2016-05-09 DIAGNOSIS — J449 Chronic obstructive pulmonary disease, unspecified: Secondary | ICD-10-CM | POA: Insufficient documentation

## 2016-05-09 DIAGNOSIS — K3189 Other diseases of stomach and duodenum: Secondary | ICD-10-CM | POA: Diagnosis not present

## 2016-05-09 DIAGNOSIS — I10 Essential (primary) hypertension: Secondary | ICD-10-CM | POA: Diagnosis not present

## 2016-05-09 DIAGNOSIS — E785 Hyperlipidemia, unspecified: Secondary | ICD-10-CM | POA: Diagnosis not present

## 2016-05-09 DIAGNOSIS — Z79899 Other long term (current) drug therapy: Secondary | ICD-10-CM | POA: Insufficient documentation

## 2016-05-09 DIAGNOSIS — Z7982 Long term (current) use of aspirin: Secondary | ICD-10-CM | POA: Diagnosis not present

## 2016-05-09 HISTORY — PX: ESOPHAGOGASTRODUODENOSCOPY (EGD) WITH PROPOFOL: SHX5813

## 2016-05-09 HISTORY — DX: Polyp of colon: K63.5

## 2016-05-09 HISTORY — DX: Chronic obstructive pulmonary disease, unspecified: J44.9

## 2016-05-09 HISTORY — DX: Anemia, unspecified: D64.9

## 2016-05-09 HISTORY — DX: Diaphragmatic hernia without obstruction or gangrene: K44.9

## 2016-05-09 HISTORY — DX: Atherosclerotic heart disease of native coronary artery without angina pectoris: I25.10

## 2016-05-09 HISTORY — DX: Hyperlipidemia, unspecified: E78.5

## 2016-05-09 HISTORY — DX: Unspecified convulsions: R56.9

## 2016-05-09 HISTORY — DX: Pleurisy: R09.1

## 2016-05-09 HISTORY — DX: Suicide attempt, initial encounter: T14.91XA

## 2016-05-09 HISTORY — PX: COLONOSCOPY WITH PROPOFOL: SHX5780

## 2016-05-09 LAB — CBC WITH DIFFERENTIAL/PLATELET
BASOS ABS: 0.1 10*3/uL (ref 0–0.1)
BASOS PCT: 1 %
EOS ABS: 0.3 10*3/uL (ref 0–0.7)
Eosinophils Relative: 5 %
HCT: 37 % — ABNORMAL LOW (ref 40.0–52.0)
Hemoglobin: 12.6 g/dL — ABNORMAL LOW (ref 13.0–18.0)
Lymphocytes Relative: 30 %
Lymphs Abs: 1.9 10*3/uL (ref 1.0–3.6)
MCH: 27.1 pg (ref 26.0–34.0)
MCHC: 33.9 g/dL (ref 32.0–36.0)
MCV: 79.9 fL — ABNORMAL LOW (ref 80.0–100.0)
MONO ABS: 0.5 10*3/uL (ref 0.2–1.0)
MONOS PCT: 9 %
Neutro Abs: 3.5 10*3/uL (ref 1.4–6.5)
Neutrophils Relative %: 55 %
PLATELETS: 307 10*3/uL (ref 150–440)
RBC: 4.64 MIL/uL (ref 4.40–5.90)
RDW: 15.4 % — AB (ref 11.5–14.5)
WBC: 6.3 10*3/uL (ref 3.8–10.6)

## 2016-05-09 LAB — PROTIME-INR
INR: 0.93
PROTHROMBIN TIME: 12.5 s (ref 11.4–15.2)

## 2016-05-09 SURGERY — COLONOSCOPY WITH PROPOFOL
Anesthesia: General

## 2016-05-09 MED ORDER — PROPOFOL 500 MG/50ML IV EMUL
INTRAVENOUS | Status: DC | PRN
  Administered 2016-05-09: 100 ug/kg/min via INTRAVENOUS

## 2016-05-09 MED ORDER — PROPOFOL 10 MG/ML IV BOLUS
INTRAVENOUS | Status: DC | PRN
  Administered 2016-05-09: 100 mg via INTRAVENOUS

## 2016-05-09 MED ORDER — SODIUM CHLORIDE 0.9 % IV SOLN
INTRAVENOUS | Status: DC
Start: 2016-05-09 — End: 2016-05-09
  Administered 2016-05-09: 15:00:00 via INTRAVENOUS

## 2016-05-09 MED ORDER — FENTANYL CITRATE (PF) 100 MCG/2ML IJ SOLN
INTRAMUSCULAR | Status: DC | PRN
  Administered 2016-05-09: 50 ug via INTRAVENOUS

## 2016-05-09 MED ORDER — MIDAZOLAM HCL 2 MG/2ML IJ SOLN
INTRAMUSCULAR | Status: DC | PRN
Start: 2016-05-09 — End: 2016-05-09
  Administered 2016-05-09: 1 mg via INTRAVENOUS

## 2016-05-09 MED ORDER — SODIUM CHLORIDE 0.9 % IV SOLN: INTRAVENOUS | Status: DC

## 2016-05-09 NOTE — Transfer of Care (Signed)
Immediate Anesthesia Transfer of Care Note  Patient: Simon RheinRobert S Marczak  Procedure(s) Performed: Procedure(s): COLONOSCOPY WITH PROPOFOL (N/A) ESOPHAGOGASTRODUODENOSCOPY (EGD) WITH PROPOFOL (N/A)  Patient Location: PACU and Endoscopy Unit  Anesthesia Type:General  Level of Consciousness: patient cooperative and lethargic  Airway & Oxygen Therapy: Patient Spontanous Breathing and Patient connected to nasal cannula oxygen  Post-op Assessment: Report given to RN and Post -op Vital signs reviewed and stable  Post vital signs: Reviewed and stable  Last Vitals:  Vitals:   05/09/16 1311 05/09/16 1513  BP: 106/72 91/69  Pulse: 72   Resp: 20   Temp: 36.5 C 36.4 C    Last Pain:  Vitals:   05/09/16 1513  TempSrc: Tympanic         Complications: No apparent anesthesia complications

## 2016-05-09 NOTE — Anesthesia Preprocedure Evaluation (Addendum)
Anesthesia Evaluation  Patient identified by MRN, date of birth, ID band Patient awake    Reviewed: Allergy & Precautions, NPO status , Patient's Chart, lab work & pertinent test results, reviewed documented beta blocker date and time   Airway Mallampati: II  TM Distance: >3 FB     Dental  (+) Chipped, Poor Dentition, Dental Advisory Given, Missing   Pulmonary COPD, former smoker,           Cardiovascular hypertension, Pt. on medications and Pt. on home beta blockers + CAD and + Past MI       Neuro/Psych Seizures -,  PSYCHIATRIC DISORDERS Schizophrenia  Neuromuscular disease    GI/Hepatic hiatal hernia,   Endo/Other    Renal/GU      Musculoskeletal   Abdominal   Peds  Hematology  (+) anemia ,   Anesthesia Other Findings etoh abuse. Stent, cardiac. Had seizures 18-20 yrs ago.  Reproductive/Obstetrics                           Anesthesia Physical Anesthesia Plan  ASA: III  Anesthesia Plan: General   Post-op Pain Management:    Induction: Intravenous  Airway Management Planned: Nasal Cannula  Additional Equipment:   Intra-op Plan:   Post-operative Plan:   Informed Consent: I have reviewed the patients History and Physical, chart, labs and discussed the procedure including the risks, benefits and alternatives for the proposed anesthesia with the patient or authorized representative who has indicated his/her understanding and acceptance.     Plan Discussed with: CRNA  Anesthesia Plan Comments:         Anesthesia Quick Evaluation

## 2016-05-09 NOTE — Op Note (Signed)
[facility] Patient Name: Brandon CapronRobert [name] Procedure Date: 05/09/2016 2:44 PM MRN: [id number] Account #: [id number] Date of Birth: September 14, 1955 Admit Type: Outpatient Age: [age] Room: [facility] ENDO ROOM 1 Gender: Male Note Status: Finalized Procedure:            Upper GI endoscopy Indications:          Iron deficiency anemia, Heartburn Providers:            Scot Junobert T. Jalaya Sarver, MD Referring MD:         No Local Md, MD (Referring MD) Medicines:            Propofol per Anesthesia Complications:        No immediate complications. Procedure:            Pre-Anesthesia Assessment:                       - After reviewing the risks and benefits, the patient                        was deemed in satisfactory condition to undergo the                        procedure.                       After obtaining informed consent, the endoscope was                        passed under direct vision. Throughout the procedure,                        the patient's blood pressure, pulse, and oxygen                        saturations were monitored continuously. The Endoscope                        was introduced through the mouth, and advanced to the                        second part of duodenum. The upper GI endoscopy was                        accomplished without difficulty. The patient tolerated                        the procedure well. Findings:      Localized mild erythema was found at the gastroesophageal junction.       Biopsies were taken with a cold forceps for histology. GEJ 45cm.      Diffuse granular mucosa was found in the gastric antrum. Biopsies were       taken with a cold forceps for histology. Biopsies were taken with a cold       forceps for Helicobacter pylori testing.      Diffuse mildly erythematous mucosa without bleeding was found in the       gastric body. Biopsies were taken with a cold forceps for histology.       Biopsies were taken with a cold forceps  for Helicobacter pylori testing.      The examined duodenum was normal.  Impression:           - Erythema at the gastroesophageal junction. Biopsied.                       - Granular gastric mucosa. Biopsied.                       - Erythematous mucosa in the gastric body. Biopsied.                       - Normal examined duodenum. Recommendation:       - Await pathology results. Scot Jun, MD 05/09/2016 2:59:47 PM This report has been signed electronically. Number of Addenda: 0 Note Initiated On: 05/09/2016 2:44 PM      [facility]

## 2016-05-09 NOTE — H&P (Signed)
Primary Care Physician:  Luna FuseEJAN-SIE, SHEIKH AHMED, MD Primary Gastroenterologist:  Dr. Mechele CollinElliott  Pre-Procedure History & Physical: HPI:  Brandon RheinRobert S Buzby is a 60 y.o. male is here for an endoscopy and colonoscopy.   Past Medical History:  Diagnosis Date  . Anemia    IDA  . Colon polyps   . COPD (chronic obstructive pulmonary disease) (HCC)   . Coronary artery disease   . Heart attack (HCC)   . Hiatal hernia   . History of ETOH abuse   . Hyperlipidemia   . Hypertension   . Pleurisy   . Schizophrenia (HCC)   . Schizophrenia (HCC)   . Seizures (HCC)   . Suicide attempt [facility](HCC)     Past Surgical History:  Procedure Laterality Date  . ABDOMINAL SURGERY     internal bleeding  . COLONOSCOPY    . CORONARY ANGIOPLASTY WITH STENT PLACEMENT    . NOSE SURGERY    . TOE AMPUTATION    . VASECTOMY      Prior to Admission medications   Medication Sig Start Date End Date Taking? Authorizing Provider  amLODipine (NORVASC) 5 MG tablet Take 5 mg by mouth daily.   Yes Historical Provider, MD  aspirin 325 MG EC tablet Take 325 mg by mouth daily.   Yes Historical Provider, MD  carvedilol (COREG) 25 MG tablet Take 25 mg by mouth 2 (two) times daily with a meal.   Yes Historical Provider, MD  cloZAPine (CLOZARIL) 100 MG tablet Take 100 mg by mouth daily.   Yes Historical Provider, MD  diltiazem (TIAZAC) 360 MG 24 hr capsule Take 360 mg by mouth daily.   Yes Historical Provider, MD  ezetimibe (ZETIA) 10 MG tablet Take 10 mg by mouth daily.   Yes Historical Provider, MD  fenofibrate 160 MG tablet Take 160 mg by mouth daily.   Yes Historical Provider, MD  PARoxetine (PAXIL) 40 MG tablet Take 40 mg by mouth every morning.   Yes Historical Provider, MD  simvastatin (ZOCOR) 40 MG tablet Take 40 mg by mouth daily.   Yes Historical Provider, MD    Allergies as of 02/18/2016 - never reviewed  Allergen Reaction Noted  . Benadryl [diphenhydramine] Other (See Comments) 04/19/2015  . Compazine  [prochlorperazine] Other (See Comments) 04/19/2015  . Dramamine [dimenhydrinate] Swelling 04/19/2015    History reviewed. No pertinent family history.  Social History   Social History  . Marital status: Married    Spouse name: N/A  . Number of children: N/A  . Years of education: N/A   Occupational History  . Not on file.   Social History Main Topics  . Smoking status: Former Smoker    Packs/day: 1.00    Quit date: 2012  . Smokeless tobacco: Former NeurosurgeonUser  . Alcohol use No  . Drug use: No  . Sexual activity: Not on file   Other Topics Concern  . Not on file   Social History Narrative  . No narrative on file    Review of Systems: See HPI, otherwise negative ROS  Physical Exam: BP 91/69   Pulse 63   Temp 97.6 F (36.4 C) (Tympanic)   Resp 16   Ht 6' (1.829 m)   Wt 88.5 kg (195 lb)   SpO2 97%   BMI 26.45 kg/m  General:   Alert,  pleasant and cooperative in NAD Head:  Normocephalic and atraumatic. Neck:  Supple; no masses or thyromegaly. Lungs:  Clear throughout to auscultation.    Heart:  Regular rate and rhythm. Abdomen:  Soft, nontender and nondistended. Normal bowel sounds, without guarding, and without rebound.   Neurologic:  Alert and  oriented x4;  grossly normal neurologically.  Impression/Plan: Brandon [name] is here for an endoscopy and colonoscopy to be performed for iron def anemia  Risks, benefits, limitations, and alternatives regarding  endoscopy and colonoscopy have been reviewed with the patient.  Questions have been answered.  All parties agreeable.   Lynnae Prude, MD  05/09/2016, 3:30 PM

## 2016-05-09 NOTE — Anesthesia Postprocedure Evaluation (Signed)
Anesthesia Post Note  Patient: Brandon RheinRobert S [name]  Procedure(s) Performed: Procedure(s) (LRB): COLONOSCOPY WITH PROPOFOL (N/A) ESOPHAGOGASTRODUODENOSCOPY (EGD) WITH PROPOFOL (N/A)  Patient location during evaluation: PACU Anesthesia Type: General Level of consciousness: awake and alert Pain management: pain level controlled Vital Signs Assessment: post-procedure vital signs reviewed and stable Respiratory status: spontaneous breathing and respiratory function stable Cardiovascular status: stable Anesthetic complications: no    Last Vitals:  Vitals:   05/09/16 1533 05/09/16 1543  BP: 115/79 118/82  Pulse: 61 61  Resp: 15 16  Temp:      Last Pain:  Vitals:   05/09/16 1513  TempSrc: Tympanic                 Brandon [name]

## 2016-05-09 NOTE — Op Note (Signed)
[facility] Gastroenterology Patient Name: Brandon [name] Procedure Date: 05/09/2016 2:44 PM MRN: [id number] Account #: [id number] Date of Birth: January 24, 1956 Admit Type: Outpatient Age: 60 Room: [facility] ENDO ROOM 1 Gender: Male Note Status: Finalized Procedure:            Colonoscopy Indications:          Iron deficiency anemia Providers:            Scot Jun, MD Referring MD:         No Local Md, MD (Referring MD) Medicines:            Propofol per Anesthesia Complications:        No immediate complications. Procedure:            Pre-Anesthesia Assessment:                       - After reviewing the risks and benefits, the patient                        was deemed in satisfactory condition to undergo the                        procedure.                       After obtaining informed consent, the colonoscope was                        passed under direct vision. Throughout the procedure,                        the patient's blood pressure, pulse, and oxygen                        saturations were monitored continuously. The                        Colonoscope was introduced through the anus with the                        intention of advancing to the cecum. The scope was                        advanced to the sigmoid colon before the procedure was                        aborted. Medications were given. The quality of the                        bowel preparation was unsatisfactory. Findings:      A large amount of solid stool was found in the proximal sigmoid colon,       precluding visualization. The procedure was aborted due to large chuncks       of formed balls of stool. Impression:           - Preparation of the colon was unsatisfactory.                       - Stool in the proximal sigmoid colon.                       -  No specimens collected. Recommendation:       - The findings and recommendations were discussed with                        the  designated responsible adult. Scot Junobert T Elliott, MD 05/09/2016 3:10:37 PM This report has been signed electronically. Number of Addenda: 0 Note Initiated On: 05/09/2016 2:44 PM Total Procedure Duration: 0 hours 5 minutes 0 seconds       [facility]

## 2016-05-12 ENCOUNTER — Encounter: Payer: Self-pay | Admitting: Unknown Physician Specialty

## 2016-05-13 LAB — SURGICAL PATHOLOGY

## 2016-11-19 ENCOUNTER — Ambulatory Visit
Admission: RE | Admit: 2016-11-19 | Payer: Medicare HMO | Source: Ambulatory Visit | Admitting: Unknown Physician Specialty

## 2016-11-19 ENCOUNTER — Encounter: Admission: RE | Payer: Self-pay | Source: Ambulatory Visit

## 2016-11-19 SURGERY — COLONOSCOPY WITH PROPOFOL
Anesthesia: General

## 2016-11-20 ENCOUNTER — Emergency Department: Payer: Medicare HMO

## 2016-11-20 ENCOUNTER — Emergency Department
Admission: EM | Admit: 2016-11-20 | Discharge: 2016-11-20 | Disposition: A | Discharge status: Home / Self Care | Payer: Medicare HMO | Attending: Emergency Medicine | Admitting: Emergency Medicine

## 2016-11-20 ENCOUNTER — Encounter: Payer: Self-pay | Admitting: *Deleted

## 2016-11-20 DIAGNOSIS — Z79899 Other long term (current) drug therapy: Secondary | ICD-10-CM | POA: Insufficient documentation

## 2016-11-20 DIAGNOSIS — R079 Chest pain, unspecified: Secondary | ICD-10-CM | POA: Diagnosis present

## 2016-11-20 DIAGNOSIS — R0789 Other chest pain: Secondary | ICD-10-CM

## 2016-11-20 DIAGNOSIS — I10 Essential (primary) hypertension: Secondary | ICD-10-CM | POA: Insufficient documentation

## 2016-11-20 DIAGNOSIS — J449 Chronic obstructive pulmonary disease, unspecified: Secondary | ICD-10-CM | POA: Diagnosis not present

## 2016-11-20 DIAGNOSIS — Z7982 Long term (current) use of aspirin: Secondary | ICD-10-CM | POA: Insufficient documentation

## 2016-11-20 DIAGNOSIS — I2510 Atherosclerotic heart disease of native coronary artery without angina pectoris: Secondary | ICD-10-CM | POA: Insufficient documentation

## 2016-11-20 DIAGNOSIS — Z87891 Personal history of nicotine dependence: Secondary | ICD-10-CM | POA: Insufficient documentation

## 2016-11-20 LAB — CBC
HEMATOCRIT: 38 % — AB (ref 40.0–52.0)
Hemoglobin: 12.5 g/dL — ABNORMAL LOW (ref 13.0–18.0)
MCH: 27 pg (ref 26.0–34.0)
MCHC: 32.8 g/dL (ref 32.0–36.0)
MCV: 82.2 fL (ref 80.0–100.0)
PLATELETS: 379 10*3/uL (ref 150–440)
RBC: 4.63 MIL/uL (ref 4.40–5.90)
RDW: 14.9 % — ABNORMAL HIGH (ref 11.5–14.5)
WBC: 9.3 10*3/uL (ref 3.8–10.6)

## 2016-11-20 LAB — BASIC METABOLIC PANEL
Anion gap: 9 (ref 5–15)
BUN: 15 mg/dL (ref 6–20)
CO2: 22 mmol/L (ref 22–32)
Calcium: 9.6 mg/dL (ref 8.9–10.3)
Chloride: 100 mmol/L — ABNORMAL LOW (ref 101–111)
Creatinine, Ser: 1.47 mg/dL — ABNORMAL HIGH (ref 0.61–1.24)
GFR, EST AFRICAN AMERICAN: 58 mL/min — AB (ref >60)
GFR, EST NON AFRICAN AMERICAN: 50 mL/min — AB (ref >60)
Glucose, Bld: 87 mg/dL (ref 65–99)
POTASSIUM: 3.9 mmol/L (ref 3.5–5.1)
SODIUM: 131 mmol/L — AB (ref 135–145)

## 2016-11-20 LAB — TROPONIN I
Troponin I: <0.03 ng/mL (ref <0.03)
Troponin I: <0.03 ng/mL (ref <0.03)

## 2016-11-20 NOTE — ED Provider Notes (Signed)
[facility] Emergency Department Provider Note  ____________________________________________  Time seen: Approximately 9:50 PM  I have reviewed the triage vital signs and the nursing notes.   HISTORY  Chief Complaint Chest Pain    HPI Brandon [name] is a 61 y.o. male who complains of central chest pain described as sharp and stabbing that started at about 5:30 PM today. Took some aspirin and then ate dinner shortly afterward which resulted in his pain resolving. Lasted for a total of about 30 minutes according to the patient. Denies radiation. Denies shortness of breath or vomiting. Denies diaphoresis. Not exertional, not pleuritic. He reports that he normally walks for exercise and he's been able to do his usual strenuous activity without any exertional symptoms. No trauma. No recent illness, no fevers or chills. No sore throat runny nose or cough.     Past Medical History:  Diagnosis Date  . Anemia    IDA  . Colon polyps   . COPD (chronic obstructive pulmonary disease) (HCC)   . Coronary artery disease   . Heart attack   . Hiatal hernia   . History of ETOH abuse   . Hyperlipidemia   . Hypertension   . Pleurisy   . Schizophrenia (HCC)   . Schizophrenia (HCC)   . Seizures (HCC)   . Suicide attempt      There are no active problems to display for this patient.    Past Surgical History:  Procedure Laterality Date  . ABDOMINAL SURGERY     internal bleeding  . COLONOSCOPY    . COLONOSCOPY WITH PROPOFOL N/A 05/09/2016   Procedure: COLONOSCOPY WITH PROPOFOL;  Surgeon: Scot Jun, MD;  Location: [facility] ENDOSCOPY;  Service: Endoscopy;  Laterality: N/A;  . CORONARY ANGIOPLASTY WITH STENT PLACEMENT    . ESOPHAGOGASTRODUODENOSCOPY (EGD) WITH PROPOFOL N/A 05/09/2016   Procedure: ESOPHAGOGASTRODUODENOSCOPY (EGD) WITH PROPOFOL;  Surgeon: Scot Jun, MD;  Location: [facility] ENDOSCOPY;  Service: Endoscopy;  Laterality: N/A;  . NOSE SURGERY    . TOE  AMPUTATION    . VASECTOMY       Prior to Admission medications   Medication Sig Start Date End Date Taking? Authorizing Provider  amLODipine (NORVASC) 5 MG tablet Take 5 mg by mouth daily.    Historical Provider, MD  aspirin 325 MG EC tablet Take 325 mg by mouth daily.    Historical Provider, MD  carvedilol (COREG) 25 MG tablet Take 25 mg by mouth 2 (two) times daily with a meal.    Historical Provider, MD  cloZAPine (CLOZARIL) 100 MG tablet Take 100 mg by mouth daily.    Historical Provider, MD  diltiazem (TIAZAC) 360 MG 24 hr capsule Take 360 mg by mouth daily.    Historical Provider, MD  ezetimibe (ZETIA) 10 MG tablet Take 10 mg by mouth daily.    Historical Provider, MD  fenofibrate 160 MG tablet Take 160 mg by mouth daily.    Historical Provider, MD  PARoxetine (PAXIL) 40 MG tablet Take 40 mg by mouth every morning.    Historical Provider, MD  simvastatin (ZOCOR) 40 MG tablet Take 40 mg by mouth daily.    Historical Provider, MD     Allergies Benadryl [diphenhydramine]; Compazine [prochlorperazine]; Depakote [divalproex sodium]; and Dramamine [dimenhydrinate]   No family history on file.  Social History Social History  Substance Use Topics  . Smoking status: Former Smoker    Packs/day: 1.00    Quit date: 2012  . Smokeless tobacco: Former Neurosurgeon  .  Alcohol use No    Review of Systems  Constitutional:   No fever or chills.  ENT:   No sore throat. No rhinorrhea. Cardiovascular:   Positive as above chest pain. Respiratory:   No dyspnea or cough. Gastrointestinal:   Negative for abdominal pain, vomiting and diarrhea.  Genitourinary:   Negative for dysuria or difficulty urinating. Musculoskeletal:   Negative for focal pain or swelling Neurological:   Negative for headaches 10-point ROS otherwise negative.  ____________________________________________   PHYSICAL EXAM:  VITAL SIGNS: ED Triage Vitals  Enc Vitals Group     BP 11/20/16 1838 124/81     Pulse Rate  11/20/16 1838 81     Resp 11/20/16 1838 20     Temp 11/20/16 1838 99.1 F (37.3 C)     Temp Source 11/20/16 1838 Oral     SpO2 11/20/16 1838 97 %     Weight 11/20/16 1834 210 lb (95.3 kg)     Height 11/20/16 1834 6' (1.829 m)     Head Circumference --      Peak Flow --      Pain Score 11/20/16 1834 6     Pain Loc --      Pain Edu? --      Excl. in GC? --     Vital signs reviewed, nursing assessments reviewed.   Constitutional:   Alert and oriented. Well appearing and in no distress. Eyes:   No scleral icterus. No conjunctival pallor. PERRL. EOMI.  No nystagmus. ENT   Head:   Normocephalic and atraumatic.   Nose:   No congestion/rhinnorhea. No septal hematoma   Mouth/Throat:   MMM, no pharyngeal erythema. No peritonsillar mass.    Neck:   No stridor. No SubQ emphysema. No meningismus. Hematological/Lymphatic/Immunilogical:   No cervical lymphadenopathy. Cardiovascular:   RRR. Symmetric bilateral radial and DP pulses.  No murmurs.  Respiratory:   Normal respiratory effort without tachypnea nor retractions. Breath sounds are clear and equal bilaterally. No wheezes/rales/rhonchi. Chest wall nontender Gastrointestinal:   Soft and nontender. Non distended. There is no CVA tenderness.  No rebound, rigidity, or guarding. Genitourinary:   deferred Musculoskeletal:   Normal range of motion in all extremities. No joint effusions.  No lower extremity tenderness.  No edema. Neurologic:   Normal speech and language.  CN 2-10 normal. Motor grossly intact. No gross focal neurologic deficits are appreciated.  Skin:    Skin is warm, dry and intact. No rash noted.  No petechiae, purpura, or bullae.  ____________________________________________    LABS (pertinent positives/negatives) (all labs ordered are listed, but only abnormal results are displayed) Labs Reviewed  BASIC METABOLIC PANEL - Abnormal; Notable for the following:       Result Value   Sodium 131 (*)    Chloride  100 (*)    Creatinine, Ser 1.47 (*)    GFR calc non Af Amer 50 (*)    GFR calc Af Amer 58 (*)    All other components within normal limits  CBC - Abnormal; Notable for the following:    Hemoglobin 12.5 (*)    HCT 38.0 (*)    RDW 14.9 (*)    All other components within normal limits  TROPONIN I  TROPONIN I   ____________________________________________   EKG  Interpreted by me  Date: 11/20/2016  Rate: 78  Rhythm: normal sinus rhythm  QRS Axis: normal  Intervals: normal  ST/T Wave abnormalities: normal  Conduction Disutrbances: none  Narrative Interpretation: unremarkable  ____________________________________________    RADIOLOGY  Dg Chest 2 View  Result Date: 11/20/2016 CLINICAL DATA:  Central chest pain and right arm pain. EXAM: CHEST  2 VIEW COMPARISON:  09/23/2014 FINDINGS: The cardiac silhouette, mediastinal and hilar contours are within normal limits and stable. The lungs are clear. No pleural effusion. The bony thorax is intact. IMPRESSION: No acute cardiopulmonary findings. Electronically Signed   By: Rudie Meyer M.D.   On: 11/20/2016 18:57    ____________________________________________   PROCEDURES Procedures  ____________________________________________   INITIAL IMPRESSION / ASSESSMENT AND PLAN / ED COURSE  Pertinent labs & imaging results that were available during my care of the patient were reviewed by me and considered in my medical decision making (see chart for details).  Patient well appearing no acute distress, presents with atypical chest pain.Considering the patient's symptoms, medical history, and physical examination today, I have low suspicion for ACS, PE, TAD, pneumothorax, carditis, mediastinitis, pneumonia, CHF, or sepsis.  Possibly related to GERD or hunger. Patient is asymptomatic at present time. EKG is normal, vital signs normal, initial labs unremarkable. I'll repeat a troponin at a 3 hour interval, plan for outpatient  follow-up if not significantly elevated.Marland Kitchen    ----------------------------------------- 10:23 PM on 11/20/2016 -----------------------------------------  Repeat troponin negative. Patient remains asymptomatic. Vital signs remain normal. We'll discharge home, follow-up with primary care.       ____________________________________________   FINAL CLINICAL IMPRESSION(S) / ED DIAGNOSES  Final diagnoses:  Atypical chest pain      New Prescriptions   No medications on file     Portions of this note were generated with dragon dictation software. Dictation errors may occur despite best attempts at proofreading.    Sharman Cheek, MD 11/20/16 2224

## 2016-11-20 NOTE — ED Triage Notes (Signed)
Pt brought in via ems from home with chest pain.  Sx began 1800 today.  No n/v/d.  Pt given asa by wife.  nonradiating pain.  No sob.  Pt alert  Speech clear

## 2016-11-20 NOTE — ED Triage Notes (Signed)
Pt arrived via EMS from home for reports of central chest pain radiating to right arm. EMS reports 130/82, 80HR, sinus rhythm. Pt has history of MI.

## 2017-04-30 ENCOUNTER — Emergency Department: Payer: Medicare HMO

## 2017-04-30 ENCOUNTER — Encounter: Payer: Self-pay | Admitting: Emergency Medicine

## 2017-04-30 DIAGNOSIS — Z79899 Other long term (current) drug therapy: Secondary | ICD-10-CM | POA: Insufficient documentation

## 2017-04-30 DIAGNOSIS — R0602 Shortness of breath: Secondary | ICD-10-CM | POA: Diagnosis present

## 2017-04-30 DIAGNOSIS — Z87891 Personal history of nicotine dependence: Secondary | ICD-10-CM | POA: Diagnosis not present

## 2017-04-30 DIAGNOSIS — J441 Chronic obstructive pulmonary disease with (acute) exacerbation: Secondary | ICD-10-CM | POA: Diagnosis not present

## 2017-04-30 DIAGNOSIS — I10 Essential (primary) hypertension: Secondary | ICD-10-CM | POA: Insufficient documentation

## 2017-04-30 DIAGNOSIS — I2510 Atherosclerotic heart disease of native coronary artery without angina pectoris: Secondary | ICD-10-CM | POA: Insufficient documentation

## 2017-04-30 NOTE — ED Triage Notes (Signed)
Patient ambulatory to triage with steady gait, without difficulty or distress noted; pt reports SHOB last several weeks accomp by occas prod cough clear sputum; denies any pain

## 2017-05-01 ENCOUNTER — Emergency Department
Admission: EM | Admit: 2017-05-01 | Discharge: 2017-05-01 | Disposition: A | Discharge status: Home / Self Care | Payer: Medicare HMO | Attending: Emergency Medicine | Admitting: Emergency Medicine

## 2017-05-01 DIAGNOSIS — J441 Chronic obstructive pulmonary disease with (acute) exacerbation: Secondary | ICD-10-CM

## 2017-05-01 LAB — COMPREHENSIVE METABOLIC PANEL
ALK PHOS: 48 U/L (ref 38–126)
ALT: 17 U/L (ref 17–63)
ANION GAP: 6 (ref 5–15)
AST: 25 U/L (ref 15–41)
Albumin: 4 g/dL (ref 3.5–5.0)
BUN: 12 mg/dL (ref 6–20)
CALCIUM: 9 mg/dL (ref 8.9–10.3)
CO2: 25 mmol/L (ref 22–32)
Chloride: 99 mmol/L — ABNORMAL LOW (ref 101–111)
Creatinine, Ser: 1.1 mg/dL (ref 0.61–1.24)
GFR calc non Af Amer: >60 mL/min (ref >60)
Glucose, Bld: 111 mg/dL — ABNORMAL HIGH (ref 65–99)
Potassium: 4 mmol/L (ref 3.5–5.1)
SODIUM: 130 mmol/L — AB (ref 135–145)
TOTAL PROTEIN: 6.5 g/dL (ref 6.5–8.1)
Total Bilirubin: 0.3 mg/dL (ref 0.3–1.2)

## 2017-05-01 LAB — TROPONIN I: Troponin I: <0.03 ng/mL (ref <0.03)

## 2017-05-01 LAB — CBC
HCT: 34.3 % — ABNORMAL LOW (ref 40.0–52.0)
HEMOGLOBIN: 11.6 g/dL — AB (ref 13.0–18.0)
MCH: 27.5 pg (ref 26.0–34.0)
MCHC: 33.7 g/dL (ref 32.0–36.0)
MCV: 81.7 fL (ref 80.0–100.0)
PLATELETS: 286 10*3/uL (ref 150–440)
RBC: 4.2 MIL/uL — ABNORMAL LOW (ref 4.40–5.90)
RDW: 14.5 % (ref 11.5–14.5)
WBC: 7.3 10*3/uL (ref 3.8–10.6)

## 2017-05-01 MED ORDER — IPRATROPIUM-ALBUTEROL 0.5-2.5 (3) MG/3ML IN SOLN
3.0000 mL | Freq: Once | RESPIRATORY_TRACT | Status: AC
  Administered 2017-05-01: 3 mL via RESPIRATORY_TRACT
  Filled 2017-05-01: qty 3

## 2017-05-01 MED ORDER — DEXAMETHASONE SODIUM PHOSPHATE 10 MG/ML IJ SOLN
10.0000 mg | Freq: Once | INTRAMUSCULAR | Status: AC
  Administered 2017-05-01: 10 mg via INTRAMUSCULAR
  Filled 2017-05-01: qty 1

## 2017-05-01 NOTE — Discharge Instructions (Signed)
Continue all medications as directed by your doctor.  Return to the ER for worsening symptoms, persistent vomiting, difficulty breathing or other concerns. °

## 2017-05-01 NOTE — ED Provider Notes (Signed)
[facility] Emergency Department Provider Note   ____________________________________________   First MD Initiated Contact with Patient 05/01/17 0022     (approximate)  I have reviewed the triage vital signs and the nursing notes.   HISTORY  Chief Complaint Shortness of Breath    HPI Brandon [name] is a 61 y.o. male who presents to the ED from home with a chief complaint of shortness of breath. Patient has a history of COPD who reports nonproductive cough and shortness of breath over the past day. Denies associated fever, chills, chest pain, abdominal pain, nausea, vomiting. Partial relief with albuterol inhaler. Denies recent travel or trauma.   Past Medical History:  Diagnosis Date  . Anemia    IDA  . Colon polyps   . COPD (chronic obstructive pulmonary disease) (HCC)   . Coronary artery disease   . Heart attack (HCC)   . Hiatal hernia   . History of ETOH abuse   . Hyperlipidemia   . Hypertension   . Pleurisy   . Schizophrenia (HCC)   . Schizophrenia (HCC)   . Seizures (HCC)   . Suicide attempt (HCC)     There are no active problems to display for this patient.   Past Surgical History:  Procedure Laterality Date  . ABDOMINAL SURGERY     internal bleeding  . COLONOSCOPY    . COLONOSCOPY WITH PROPOFOL N/A 05/09/2016   Procedure: COLONOSCOPY WITH PROPOFOL;  Surgeon: Scot Jun, MD;  Location: [facility] ENDOSCOPY;  Service: Endoscopy;  Laterality: N/A;  . CORONARY ANGIOPLASTY WITH STENT PLACEMENT    . ESOPHAGOGASTRODUODENOSCOPY (EGD) WITH PROPOFOL N/A 05/09/2016   Procedure: ESOPHAGOGASTRODUODENOSCOPY (EGD) WITH PROPOFOL;  Surgeon: Scot Jun, MD;  Location: [facility] ENDOSCOPY;  Service: Endoscopy;  Laterality: N/A;  . NOSE SURGERY    . TOE AMPUTATION    . VASECTOMY      Prior to Admission medications   Medication Sig Start Date End Date Taking? Authorizing Provider  amLODipine (NORVASC) 5 MG tablet Take 5 mg by mouth daily.     [provider]  aspirin 325 MG EC tablet Take 325 mg by mouth daily.    [provider]  carvedilol (COREG) 25 MG tablet Take 25 mg by mouth 2 (two) times daily with a meal.    [provider]  cloZAPine (CLOZARIL) 100 MG tablet Take 100 mg by mouth daily.    [provider]  diltiazem (TIAZAC) 360 MG 24 hr capsule Take 360 mg by mouth daily.    [provider]  ezetimibe (ZETIA) 10 MG tablet Take 10 mg by mouth daily.    [provider]  fenofibrate 160 MG tablet Take 160 mg by mouth daily.    [provider]  PARoxetine (PAXIL) 40 MG tablet Take 40 mg by mouth every morning.    [provider]  simvastatin (ZOCOR) 40 MG tablet Take 40 mg by mouth daily.    [provider]    Allergies Benadryl [diphenhydramine]; Compazine [prochlorperazine]; Depakote [divalproex sodium]; and Dramamine [dimenhydrinate]  No family history on file.  Social History Social History  Substance Use Topics  . Smoking status: Former Smoker    Packs/day: 1.00    Quit date: 2012  . Smokeless tobacco: Former Neurosurgeon  . Alcohol use No    Review of Systems  Constitutional: No fever/chills Eyes: No visual changes. ENT: No sore throat. Cardiovascular: Denies chest pain. Respiratory: positive for cough and shortness of breath. Gastrointestinal: No  abdominal pain.  No nausea, no vomiting.  No diarrhea.  No constipation. Genitourinary: Negative for dysuria. Musculoskeletal: Negative for back pain. Skin: Negative for rash. Neurological: Negative for headaches, focal weakness or numbness.   ____________________________________________   PHYSICAL EXAM:  VITAL SIGNS: ED Triage Vitals  Enc Vitals Group     BP 04/30/17 2331 133/76     Pulse Rate 04/30/17 2331 70     Resp 04/30/17 2331 18     Temp 04/30/17 2331 97.7 F (36.5 C)     Temp Source 04/30/17 2331 Oral     SpO2 04/30/17 2331 98 %     Weight 04/30/17 2330 210 lb  (95.3 kg)     Height 04/30/17 2330 6' (1.829 m)     Head Circumference --      Peak Flow --      Pain Score --      Pain Loc --      Pain Edu? --      Excl. in GC? --     Constitutional: Alert and oriented. Well appearing and in no acute distress. Eyes: Conjunctivae are normal. PERRL. EOMI. Head: Atraumatic. Nose: No congestion/rhinnorhea. Mouth/Throat: Mucous membranes are moist.  Oropharynx non-erythematous. Neck: No stridor.   Cardiovascular: Normal rate, regular rhythm. Grossly normal heart sounds.  Good peripheral circulation. Respiratory: Normal respiratory effort.  No retractions. Lungs mildly diminished bilaterally; otherwise CTAB. Gastrointestinal: Soft and nontender. No distention. No abdominal bruits. No CVA tenderness. Musculoskeletal: No lower extremity tenderness nor edema.  No joint effusions. Neurologic:  Normal speech and language. No gross focal neurologic deficits are appreciated. No gait instability. Skin:  Skin is warm, dry and intact. No rash noted. Psychiatric: Mood and affect are normal. Speech and behavior are normal.  ____________________________________________   LABS (all labs ordered are listed, but only abnormal results are displayed)  Labs Reviewed  CBC - Abnormal; Notable for the following:       Result Value   RBC 4.20 (*)    Hemoglobin 11.6 (*)    HCT 34.3 (*)    All other components within normal limits  COMPREHENSIVE METABOLIC PANEL - Abnormal; Notable for the following:    Sodium 130 (*)    Chloride 99 (*)    Glucose, Bld 111 (*)    All other components within normal limits  TROPONIN I   ____________________________________________  EKG  ED ECG REPORT I, Gissell Barra J, the attending physician, personally viewed and interpreted this ECG.   Date: 05/01/2017  EKG Time: 2329  Rate: 70  Rhythm: normal EKG, normal sinus rhythm  Axis: normal  Intervals:none  ST&T Change:  nonspecific  ____________________________________________  RADIOLOGY  Dg Chest 2 View  Result Date: 04/30/2017 CLINICAL DATA:  Shortness of breath for several weeks, productive cough. EXAM: CHEST  2 VIEW COMPARISON:  Chest x-ray dated 11/20/2016. FINDINGS: Heart size and mediastinal contours are normal. Lungs are clear. No pleural effusion or pneumothorax seen. No acute or suspicious osseous finding. IMPRESSION: No active cardiopulmonary disease. No evidence of pneumonia or pulmonary edema. Electronically Signed   By: Bary Richard M.D.   On: 04/30/2017 23:56    ____________________________________________   PROCEDURES  Procedure(s) performed: None  Procedures  Critical Care performed: No  ____________________________________________   INITIAL IMPRESSION / ASSESSMENT AND PLAN / ED COURSE  Pertinent labs & imaging results that were available during my care of the patient were reviewed by me and considered in my medical decision making (see chart for details).  61 year old male  with COPD presenting with cough and shortness of breath. Differential diagnosis includes COPD exacerbation, bronchitis, pneumonia, pulmonary edema.laboratory and imaging studies remarkable for stable mild hyponatremia. Will administer DuoNeb treatment, IM Decadron and reassess.  Clinical Course as of May 01 153  Fri May 01, 2017  0152 Aeration improved. Patient feeling much better. Room air saturations 99%. Strict return precautions given. Patient and spouse verbalize understanding and agree with plan of care.  [JS]    Clinical Course User Index [JS] Irean Hong, MD     ____________________________________________   FINAL CLINICAL IMPRESSION(S) / ED DIAGNOSES  Final diagnoses:  COPD exacerbation (HCC)      NEW MEDICATIONS STARTED DURING THIS VISIT:  New Prescriptions   No medications on file     Note:  This document was prepared using Dragon voice recognition software and may include  unintentional dictation errors.    Irean Hong, MD 05/01/17 [phone]

## 2017-05-15 ENCOUNTER — Emergency Department
Admission: EM | Admit: 2017-05-15 | Discharge: 2017-05-15 | Disposition: A | Discharge status: Home / Self Care | Payer: Medicare HMO | Attending: Emergency Medicine | Admitting: Emergency Medicine

## 2017-05-15 DIAGNOSIS — R0989 Other specified symptoms and signs involving the circulatory and respiratory systems: Secondary | ICD-10-CM | POA: Diagnosis present

## 2017-05-15 DIAGNOSIS — J449 Chronic obstructive pulmonary disease, unspecified: Secondary | ICD-10-CM | POA: Insufficient documentation

## 2017-05-15 DIAGNOSIS — F458 Other somatoform disorders: Secondary | ICD-10-CM | POA: Insufficient documentation

## 2017-05-15 DIAGNOSIS — Z87891 Personal history of nicotine dependence: Secondary | ICD-10-CM | POA: Insufficient documentation

## 2017-05-15 DIAGNOSIS — I10 Essential (primary) hypertension: Secondary | ICD-10-CM | POA: Diagnosis not present

## 2017-05-15 DIAGNOSIS — I2510 Atherosclerotic heart disease of native coronary artery without angina pectoris: Secondary | ICD-10-CM | POA: Insufficient documentation

## 2017-05-15 DIAGNOSIS — Z9861 Coronary angioplasty status: Secondary | ICD-10-CM | POA: Diagnosis not present

## 2017-05-15 DIAGNOSIS — F209 Schizophrenia, unspecified: Secondary | ICD-10-CM | POA: Insufficient documentation

## 2017-05-15 DIAGNOSIS — Z79899 Other long term (current) drug therapy: Secondary | ICD-10-CM | POA: Diagnosis not present

## 2017-05-15 MED ORDER — BENZONATATE 100 MG PO CAPS
100.0000 mg | ORAL_CAPSULE | Freq: Once | ORAL | Status: AC
  Administered 2017-05-15: 100 mg via ORAL
  Filled 2017-05-15: qty 1

## 2017-05-15 NOTE — Discharge Instructions (Signed)
You were evaluated tonight with the sensation that something was stuck in the back of your throat (note is a globus sensation).  Fortunately whatever was stuck does not seem to be there any longer.  If he continued to have difficulties when swallowing, we encourage you to schedule a follow-up appointment with a GI provider; follow-up contact information was provided in this paperwork.  Return to the emergency department if you develop new or worsening symptoms that concern you.

## 2017-05-15 NOTE — ED Notes (Signed)
Signature pad not responding at this time, pt signed hard copy and placed on pt's chart

## 2017-05-15 NOTE — ED Triage Notes (Signed)
Per EMS, pt reports eating salad at 0230 tonight and states "lettuce is stuck in back of my throat." Pt denies hx of esophageal strictures and states he's not had problems swallowing. Pt able to speak in complete sentences at this time, no diff breathing noted. Pt states he has not been able to cough lettuce up PTA. Pt A&O and in NAD at this time.

## 2017-05-15 NOTE — ED Provider Notes (Signed)
[facility] Emergency Department Provider Note  ____________________________________________   First MD Initiated Contact with Patient 05/15/17 [phone]     (approximate)  I have reviewed the triage vital signs and the nursing notes.   HISTORY  Chief Complaint Foreign Body ("Lettuce in back of throat" )    HPI Brandon [name] is a 61 y.o. male With medical and psychiatric history as listed below who presents by EMS for evaluation of possible esophageal foreign body.  He reports that he took his pills tonight and feels like one of the pills got stuck.  He drank a lot of water and eventually it went down.  He could not sleep so around 2:30 or 3:00 in the morning he was eating a salad and feels like a piece of lettuce got stuck in the back of her throat and feels like it is still there.  He has never had issues with esophageal stricture or difficulty swallowing in the past.  He denies any other numbness or weakness in any of his extremities, is having no difficulty with ambulation, and is not having any difficulty with speech.  He is still having a sensation of something stuck in the back of his throat but he is not having any difficulty swallowing, handling his secretions, nor breathing.  He denies recent illness and denies fever/chills, chest pain, shortness of breath, nausea, vomiting, and abdominal pain.  Symptoms are mild and acute in onset.  Nothing in particular makes the patient's symptoms better nor worse.     Past Medical History:  Diagnosis Date  . Anemia    IDA  . Colon polyps   . COPD (chronic obstructive pulmonary disease) (HCC)   . Coronary artery disease   . Heart attack (HCC)   . Hiatal hernia   . History of ETOH abuse   . Hyperlipidemia   . Hypertension   . Pleurisy   . Schizophrenia (HCC)   . Schizophrenia (HCC)   . Seizures (HCC)   . Suicide attempt (HCC)     There are no active problems to display for this patient.   Past Surgical  History:  Procedure Laterality Date  . ABDOMINAL SURGERY     internal bleeding  . COLONOSCOPY    . COLONOSCOPY WITH PROPOFOL N/A 05/09/2016   Procedure: COLONOSCOPY WITH PROPOFOL;  Surgeon: Scot Jun, MD;  Location: [facility] ENDOSCOPY;  Service: Endoscopy;  Laterality: N/A;  . CORONARY ANGIOPLASTY WITH STENT PLACEMENT    . ESOPHAGOGASTRODUODENOSCOPY (EGD) WITH PROPOFOL N/A 05/09/2016   Procedure: ESOPHAGOGASTRODUODENOSCOPY (EGD) WITH PROPOFOL;  Surgeon: Scot Jun, MD;  Location: [facility] ENDOSCOPY;  Service: Endoscopy;  Laterality: N/A;  . NOSE SURGERY    . TOE AMPUTATION    . VASECTOMY      Prior to Admission medications   Medication Sig Start Date End Date Taking? Authorizing Provider  amLODipine (NORVASC) 5 MG tablet Take 5 mg by mouth daily.    [provider]  aspirin 325 MG EC tablet Take 325 mg by mouth daily.    [provider]  carvedilol (COREG) 25 MG tablet Take 25 mg by mouth 2 (two) times daily with a meal.    [provider]  cloZAPine (CLOZARIL) 100 MG tablet Take 100 mg by mouth daily.    [provider]  diltiazem (TIAZAC) 360 MG 24 hr capsule Take 360 mg by mouth daily.    [provider]  ezetimibe (ZETIA) 10 MG tablet Take 10 mg by mouth  daily.    [provider]  fenofibrate 160 MG tablet Take 160 mg by mouth daily.    [provider]  PARoxetine (PAXIL) 40 MG tablet Take 40 mg by mouth every morning.    [provider]  simvastatin (ZOCOR) 40 MG tablet Take 40 mg by mouth daily.    [provider]    Allergies Benadryl [diphenhydramine]; Compazine [prochlorperazine]; Depakote [divalproex sodium]; and Dramamine [dimenhydrinate]  No family history on file.  Social History Social History  Substance Use Topics  . Smoking status: Former Smoker    Packs/day: 1.00    Quit date: 2012  . Smokeless tobacco: Former Neurosurgeon  . Alcohol use No    Review of Systems Constitutional: No  fever/chills ENT: Foreign body sensation in the back of throat.  No difficulty swallowing. No sore throat. Cardiovascular: Denies chest pain. Respiratory: Denies shortness of breath. Gastrointestinal: No abdominal pain.  No nausea, no vomiting.  No diarrhea.  No constipation. Neurological: Negative for headaches, focal weakness or numbness.   ____________________________________________   PHYSICAL EXAM:  VITAL SIGNS: ED Triage Vitals  Enc Vitals Group     BP 05/15/17 0417 133/79     Pulse Rate 05/15/17 0417 70     Resp 05/15/17 0417 17     Temp 05/15/17 0417 98.1 F (36.7 C)     Temp Source 05/15/17 0417 Oral     SpO2 05/15/17 0417 99 %     Weight 05/15/17 0418 99.8 kg (220 lb)     Height 05/15/17 0418 1.829 m (6')     Head Circumference --      Peak Flow --      Pain Score --      Pain Loc --      Pain Edu? --      Excl. in GC? --     Constitutional: Alert and oriented. Well appearing and in no acute distress. Eyes: Conjunctivae are normal.  Head: Atraumatic. Nose: No congestion/rhinnorhea. Mouth/Throat: Mucous membranes are moist.  Oropharynx non-erythematous. No visible abnormalities or obstruction. Neck: No stridor.  No meningeal signs.   Respiratory: Normal respiratory effort.  No retractions. Lungs CTAB with no w/r/r Neurologic:  Normal speech and language. No gross focal neurologic deficits are appreciated.  Skin:  Skin is warm, dry and intact. No rash noted. Psychiatric: Mood and affect are normal. Speech and behavior are normal.  ____________________________________________   LABS (all labs ordered are listed, but only abnormal results are displayed)  Labs Reviewed - No data to display ____________________________________________  EKG  None - EKG not ordered by ED physician ____________________________________________  RADIOLOGY   No results found.  ____________________________________________   PROCEDURES  Critical Care performed:  No   Procedure(s) performed:   Procedures   ____________________________________________   INITIAL IMPRESSION / ASSESSMENT AND PLAN / ED COURSE  As part of my medical decision making, I reviewed the following data within the electronic MEDICAL RECORD NUMBER Nursing notes reviewed and incorporated and Notes from prior ED visits    esophageal foreign body is certainly possible in the differential diagnosis but I feel that globus hystericus/globus sensation is most likely.  CVA is also possible although much less likely.  There is no evidence of a recent infectious history and his exam is reassuring with no signs or symptoms of pain, abscess, pharyngeal infection, etc.  If he truly has a foreign body such as a pill or let us it is extremely unlikely that plain imaging would identify it  so I will not order any x-rays at this time.  I explained to him about globus sensation and give him a diet coke to drink.  I will have him suck on a Tessalon Perles and let it dissolve so that he can numb up the back of his throat and tongue and see if this helps the sensation.  He is protecting his airway, has no respiratory distress, and has no difficulty tolerating his secretions, speaking, nor swallowing.  I do not feel that he likely has an acute or emergent medical condition at this time.  I will reassess after he has a chance to drink some diet coke and take the Tessalon .  Clinical Course as of May 16 603  Fri May 15, 2017  0600 The patient tolerated a full can of soda and is currently laying supine without any difficulty breathing or swallowing.  He states that he feels that are in no longer feels anything in his throat and is just tired.  No indication for further workup or management at this time.  I gave my usual and customary return precautions.     [CF]    Clinical Course User Index [CF] Loleta Rose, MD    ____________________________________________  FINAL CLINICAL IMPRESSION(S) / ED  DIAGNOSES  Final diagnoses:  Globus sensation     MEDICATIONS GIVEN DURING THIS VISIT:  Medications  benzonatate (TESSALON) capsule 100 mg (100 mg Oral Given 05/15/17 0503)     NEW OUTPATIENT MEDICATIONS STARTED DURING THIS VISIT:  New Prescriptions   No medications on file    Modified Medications   No medications on file    Discontinued Medications   No medications on file     Note:  This document was prepared using Dragon voice recognition software and may include unintentional dictation errors.    Loleta Rose, MD 05/15/17 [phone]

## 2017-06-04 ENCOUNTER — Emergency Department: Payer: Medicare HMO

## 2017-06-04 ENCOUNTER — Emergency Department
Admission: EM | Admit: 2017-06-04 | Discharge: 2017-06-05 | Disposition: A | Discharge status: Home / Self Care | Payer: Medicare HMO | Attending: Emergency Medicine | Admitting: Emergency Medicine

## 2017-06-04 DIAGNOSIS — Z955 Presence of coronary angioplasty implant and graft: Secondary | ICD-10-CM | POA: Insufficient documentation

## 2017-06-04 DIAGNOSIS — T467X5A Adverse effect of peripheral vasodilators, initial encounter: Secondary | ICD-10-CM | POA: Insufficient documentation

## 2017-06-04 DIAGNOSIS — F209 Schizophrenia, unspecified: Secondary | ICD-10-CM | POA: Insufficient documentation

## 2017-06-04 DIAGNOSIS — I10 Essential (primary) hypertension: Secondary | ICD-10-CM | POA: Diagnosis not present

## 2017-06-04 DIAGNOSIS — Z7982 Long term (current) use of aspirin: Secondary | ICD-10-CM | POA: Insufficient documentation

## 2017-06-04 DIAGNOSIS — I252 Old myocardial infarction: Secondary | ICD-10-CM | POA: Diagnosis not present

## 2017-06-04 DIAGNOSIS — I2510 Atherosclerotic heart disease of native coronary artery without angina pectoris: Secondary | ICD-10-CM | POA: Diagnosis not present

## 2017-06-04 DIAGNOSIS — R42 Dizziness and giddiness: Secondary | ICD-10-CM | POA: Diagnosis present

## 2017-06-04 DIAGNOSIS — T50905A Adverse effect of unspecified drugs, medicaments and biological substances, initial encounter: Secondary | ICD-10-CM

## 2017-06-04 DIAGNOSIS — Z87891 Personal history of nicotine dependence: Secondary | ICD-10-CM | POA: Insufficient documentation

## 2017-06-04 DIAGNOSIS — Z79899 Other long term (current) drug therapy: Secondary | ICD-10-CM | POA: Insufficient documentation

## 2017-06-04 DIAGNOSIS — J449 Chronic obstructive pulmonary disease, unspecified: Secondary | ICD-10-CM | POA: Diagnosis not present

## 2017-06-04 LAB — CBC
HEMATOCRIT: 38.1 % — AB (ref 40.0–52.0)
HEMOGLOBIN: 12.4 g/dL — AB (ref 13.0–18.0)
MCH: 26.7 pg (ref 26.0–34.0)
MCHC: 32.6 g/dL (ref 32.0–36.0)
MCV: 82.1 fL (ref 80.0–100.0)
Platelets: 351 10*3/uL (ref 150–440)
RBC: 4.64 MIL/uL (ref 4.40–5.90)
RDW: 14.9 % — ABNORMAL HIGH (ref 11.5–14.5)
WBC: 10 10*3/uL (ref 3.8–10.6)

## 2017-06-04 LAB — BASIC METABOLIC PANEL
ANION GAP: 9 (ref 5–15)
BUN: 14 mg/dL (ref 6–20)
CHLORIDE: 99 mmol/L — AB (ref 101–111)
CO2: 25 mmol/L (ref 22–32)
CREATININE: 1.14 mg/dL (ref 0.61–1.24)
Calcium: 9.5 mg/dL (ref 8.9–10.3)
GFR calc non Af Amer: >60 mL/min (ref >60)
Glucose, Bld: 116 mg/dL — ABNORMAL HIGH (ref 65–99)
POTASSIUM: 4.1 mmol/L (ref 3.5–5.1)
SODIUM: 133 mmol/L — AB (ref 135–145)

## 2017-06-04 LAB — TROPONIN I: Troponin I: <0.03 ng/mL (ref <0.03)

## 2017-06-04 NOTE — ED Triage Notes (Signed)
Patient c/o chest discomfort radiating to back. Patient c/o SOB, weakness, diaphoresis, nausea, dizziness/lightheadedness.

## 2017-06-05 NOTE — ED Provider Notes (Signed)
[facility] Emergency Department Provider Note   First MD Initiated Contact with Patient 06/04/17 2343     (approximate)  I have reviewed the triage vital signs and the nursing notes.   HISTORY  Chief Complaint Weakness and Chest Pain    HPI Brandon [name] is a 60 y.o. male Presents to the emergency department with feelings of being flushed,dizziness, anxious, nauseated and sweating after taking yohimbine tonight at approximately 9 PM. Patient statesthis was first time taking this medication. Patient states while being in the emergency department symptoms have resolved.   Past Medical History:  Diagnosis Date  . Anemia    IDA  . Colon polyps   . COPD (chronic obstructive pulmonary disease) (HCC)   . Coronary artery disease   . Heart attack (HCC)   . Hiatal hernia   . History of ETOH abuse   . Hyperlipidemia   . Hypertension   . Pleurisy   . Schizophrenia (HCC)   . Schizophrenia (HCC)   . Seizures (HCC)   . Suicide attempt (HCC)     There are no active problems to display for this patient.   Past Surgical History:  Procedure Laterality Date  . ABDOMINAL SURGERY     internal bleeding  . COLONOSCOPY    . COLONOSCOPY WITH PROPOFOL N/A 05/09/2016   Procedure: COLONOSCOPY WITH PROPOFOL;  Surgeon: Scot Jun, MD;  Location: [facility] ENDOSCOPY;  Service: Endoscopy;  Laterality: N/A;  . CORONARY ANGIOPLASTY WITH STENT PLACEMENT    . ESOPHAGOGASTRODUODENOSCOPY (EGD) WITH PROPOFOL N/A 05/09/2016   Procedure: ESOPHAGOGASTRODUODENOSCOPY (EGD) WITH PROPOFOL;  Surgeon: Scot Jun, MD;  Location: [facility] ENDOSCOPY;  Service: Endoscopy;  Laterality: N/A;  . NOSE SURGERY    . TOE AMPUTATION    . VASECTOMY      Prior to Admission medications   Medication Sig Start Date End Date Taking? Authorizing Provider  amLODipine (NORVASC) 5 MG tablet Take 5 mg by mouth daily.    [provider]  aspirin 325 MG EC tablet Take 325 mg by mouth daily.     [provider]  carvedilol (COREG) 25 MG tablet Take 25 mg by mouth 2 (two) times daily with a meal.    [provider]  cloZAPine (CLOZARIL) 100 MG tablet Take 100 mg by mouth daily.    [provider]  diltiazem (TIAZAC) 360 MG 24 hr capsule Take 360 mg by mouth daily.    [provider]  ezetimibe (ZETIA) 10 MG tablet Take 10 mg by mouth daily.    [provider]  fenofibrate 160 MG tablet Take 160 mg by mouth daily.    [provider]  PARoxetine (PAXIL) 40 MG tablet Take 40 mg by mouth every morning.    [provider]  simvastatin (ZOCOR) 40 MG tablet Take 40 mg by mouth daily.    [provider]    Allergies Benadryl [diphenhydramine]; Compazine [prochlorperazine]; Depakote [divalproex sodium]; and Dramamine [dimenhydrinate]  No family history on file.  Social History Social History  Substance Use Topics  . Smoking status: Former Smoker    Packs/day: 1.00    Quit date: 2012  . Smokeless tobacco: Former Neurosurgeon  . Alcohol use No    Review of Systems Constitutional: No fever/chills Eyes: No visual changes. ENT: No sore throat. Cardiovascular: Denies chest pain. Respiratory: Denies shortness of breath. Gastrointestinal: No abdominal pain.  No nausea, no vomiting.  No diarrhea.  No constipation. Genitourinary: Negative for dysuria. Musculoskeletal:  Negative for neck pain.  Negative for back pain. Integumentary: Negative for rash.positive for feeling flush Neurological: Negative for headaches, focal weakness or numbness.positive for dizziness psychiatry: Positive for anxiety  ____________________________________________   PHYSICAL EXAM:  VITAL SIGNS: ED Triage Vitals  Enc Vitals Group     BP 06/04/17 2129 (!) 157/90     Pulse Rate 06/04/17 2129 92     Resp 06/04/17 2129 19     Temp 06/04/17 2129 98.1 F (36.7 C)     Temp Source 06/04/17 2129 Oral     SpO2 06/04/17 2129 96 %     Weight  06/04/17 2130 99.8 kg (220 lb)     Height 06/04/17 2130 1.829 m (6')     Head Circumference --      Peak Flow --      Pain Score 06/04/17 2129 1     Pain Loc --      Pain Edu? --      Excl. in GC? --     Constitutional: Alert and oriented. Well appearing and in no acute distress. Eyes: Conjunctivae are normal. PERRL. EOMI. Head: Atraumatic. Mouth/Throat: Mucous membranes are moist.  Oropharynx non-erythematous. Neck: No stridor.  Cardiovascular: Normal rate, regular rhythm. Good peripheral circulation. Grossly normal heart sounds. Respiratory: Normal respiratory effort.  No retractions. Lungs CTAB. Gastrointestinal: Soft and nontender. No distention.  Musculoskeletal: No lower extremity tenderness nor edema. No gross deformities of extremities. Neurologic:  Normal speech and language. No gross focal neurologic deficits are appreciated.  Skin:  Skin is warm, dry and intact. No rash noted. Psychiatric: Mood and affect are normal. Speech and behavior are normal.  ____________________________________________   LABS (all labs ordered are listed, but only abnormal results are displayed)  Labs Reviewed  BASIC METABOLIC PANEL - Abnormal; Notable for the following:       Result Value   Sodium 133 (*)    Chloride 99 (*)    Glucose, Bld 116 (*)    All other components within normal limits  CBC - Abnormal; Notable for the following:    Hemoglobin 12.4 (*)    HCT 38.1 (*)    RDW 14.9 (*)    All other components within normal limits  TROPONIN I  CBG MONITORING, ED   ____________________________________________  EKG  ED ECG REPORT I, [place] N BROWN, the attending physician, personally viewed and interpreted this ECG.   Date: 06/05/2017  EKG Time: 9:25 PM  Rate: 95  Rhythm: normal sinus rhythm  Axis: normal  Intervals:normal  ST&T Change: none  ____________________________________________  RADIOLOGY I, [place] N BROWN, personally viewed and evaluated these images  (plain radiographs) as part of my medical decision making, as well as reviewing the written report by the radiologist.  Dg Chest 2 View  Result Date: 06/04/2017 CLINICAL DATA:  Acute onset of generalized chest discomfort, radiating to the back. Shortness of breath and generalized weakness. Diaphoresis and nausea, dizziness and lightheadedness. Initial encounter. EXAM: CHEST  2 VIEW COMPARISON:  Chest radiograph performed 04/30/2017 FINDINGS: The lungs are well-aerated and clear. There is no evidence of focal opacification, pleural effusion or pneumothorax. The heart is normal in size; the mediastinal contour is within normal limits. No acute osseous abnormalities are seen. IMPRESSION: No acute cardiopulmonary process seen. Electronically Signed   By: Roanna RaiderJeffery  Chang M.D.   On: 06/04/2017 22:10    __________________ Procedures   ____________________________________________   INITIAL IMPRESSION / ASSESSMENT AND PLAN / ED COURSE  As part of my medical  decision making, I reviewed the following data within the electronic MEDICAL RECORD NUMBER33 year old male presenting with above stated history of physical exam most consistent with side effect of taking yohimbine. However. Considered possibly cardiac etiology and a such EKG performed which revealed no evidence of infarction laboratory data unremarkable including troponin 1. Patient is a symptomatically at this time. Patient will be discharged home with recommendation follow-up with primary care provider for furthererectile dysfunction managed. management     ____________________________________________  FINAL CLINICAL IMPRESSION(S) / ED DIAGNOSES  Final diagnoses:  Medication side effect, initial encounter     MEDICATIONS GIVEN DURING THIS VISIT:  Medications - No data to display   NEW OUTPATIENT MEDICATIONS STARTED DURING THIS VISIT:  New Prescriptions   No medications on file    Modified Medications   No medications on file     Discontinued Medications   No medications on file     Note:  This document was prepared using Dragon voice recognition software and may include unintentional dictation errors.    Darci Current, MD 06/05/17 [phone]

## 2017-06-05 NOTE — ED Notes (Signed)

## 2017-07-24 ENCOUNTER — Ambulatory Visit: Payer: Self-pay | Admitting: Urology

## 2017-08-07 ENCOUNTER — Ambulatory Visit: Payer: Self-pay | Admitting: Urology

## 2017-08-19 ENCOUNTER — Ambulatory Visit: Payer: Self-pay | Admitting: Urology

## 2017-08-26 ENCOUNTER — Encounter: Payer: Self-pay | Admitting: Urology

## 2017-08-26 ENCOUNTER — Ambulatory Visit (INDEPENDENT_AMBULATORY_CARE_PROVIDER_SITE_OTHER): Payer: Medicare HMO | Admitting: Urology

## 2017-08-26 VITALS — BP 118/75 | HR 84 | Ht 72.0 in | Wt 214.3 lb

## 2017-08-26 DIAGNOSIS — N138 Other obstructive and reflux uropathy: Secondary | ICD-10-CM

## 2017-08-26 DIAGNOSIS — N401 Enlarged prostate with lower urinary tract symptoms: Secondary | ICD-10-CM | POA: Diagnosis not present

## 2017-08-26 DIAGNOSIS — N5201 Erectile dysfunction due to arterial insufficiency: Secondary | ICD-10-CM | POA: Diagnosis not present

## 2017-08-26 NOTE — Progress Notes (Signed)
08/26/2017 2:56 PM   Brandon [name] [birth year] [id number]  Referring provider: Sherron Monday, MD [address] [place], [place] [address]  Chief Complaint  Patient presents with  . Benign Prostatic Hypertrophy  . Erectile Dysfunction    HPI: Brandon [name] is a 62 year old male referred for evaluation of erectile dysfunction and BPH.  He has a several year history of difficulty achieving and maintaining an erection.  He currently has partial erections which are typically not firm enough for penetration.  He was using yohimbine and states it worked well however recently had side effects of dizziness and sweating and discontinued.  He states he cannot afford PDE 5 inhibitors however he is on a daily nitrate dose and is not a candidate for this class of medications.  Organic risk factors include coronary artery disease, hypertension, hyperlipidemia, antihypertensive medications.  He smoked for 35 years and quit approximately 3 years ago.  He also has urinary frequency, urgency and and intermittent urinary stream.  He is currently on tamsulosin and states his voiding symptoms are not bothersome. He denies dysuria or gross hematuria.  He denies flank, abdominal, pelvic or scrotal pain.  He had a normal PSA and testosterone level.   PMH: Past Medical History:  Diagnosis Date  . Anemia    IDA  . Colon polyps   . COPD (chronic obstructive pulmonary disease) (HCC)   . Coronary artery disease   . Heart attack (HCC)   . Hiatal hernia   . History of ETOH abuse   . Hyperlipidemia   . Hypertension   . Pleurisy   . Schizophrenia (HCC)   . Schizophrenia (HCC)   . Seizures (HCC)   . Suicide attempt [facility])     Surgical History: Past Surgical History:  Procedure Laterality Date  . ABDOMINAL SURGERY     internal bleeding  . COLONOSCOPY    . COLONOSCOPY WITH PROPOFOL N/A 05/09/2016   Procedure: COLONOSCOPY WITH PROPOFOL;  Surgeon: Scot Jun, MD;  Location: [facility] ENDOSCOPY;   Service: Endoscopy;  Laterality: N/A;  . CORONARY ANGIOPLASTY WITH STENT PLACEMENT    . ESOPHAGOGASTRODUODENOSCOPY (EGD) WITH PROPOFOL N/A 05/09/2016   Procedure: ESOPHAGOGASTRODUODENOSCOPY (EGD) WITH PROPOFOL;  Surgeon: Scot Jun, MD;  Location: [facility] ENDOSCOPY;  Service: Endoscopy;  Laterality: N/A;  . NOSE SURGERY    . TOE AMPUTATION    . VASECTOMY      Home Medications:  Allergies as of 08/26/2017      Reactions   Benadryl [diphenhydramine] Other (See Comments)   "stuffy"   Compazine [prochlorperazine] Other (See Comments)   Dystonic rxn   Depakote [divalproex Sodium]    Dramamine [dimenhydrinate] Swelling      Medication List        Accurate as of 08/26/17  2:56 PM. Always use your most recent med list.          amLODipine 5 MG tablet Commonly known as:  NORVASC Take 5 mg by mouth daily.   aspirin 325 MG EC tablet Take 325 mg by mouth daily.   buPROPion 100 MG 12 hr tablet Commonly known as:  WELLBUTRIN SR Take 100 mg by mouth 2 (two) times daily.   carvedilol 25 MG tablet Commonly known as:  COREG Take 25 mg by mouth 2 (two) times daily with a meal.   cloZAPine 100 MG tablet Commonly known as:  CLOZARIL Take 100 mg by mouth daily.   diltiazem 360 MG 24 hr capsule Commonly known as:  TIAZAC Take 360  mg by mouth daily.   ezetimibe 10 MG tablet Commonly known as:  ZETIA Take 10 mg by mouth daily.   famotidine 20 MG tablet Commonly known as:  PEPCID Take 20 mg by mouth 2 (two) times daily.   fenofibrate 160 MG tablet Take 160 mg by mouth daily.   isosorbide mononitrate 30 MG 24 hr tablet Commonly known as:  IMDUR Take 30 mg by mouth daily.   PARoxetine 40 MG tablet Commonly known as:  PAXIL Take 40 mg by mouth every morning.   simvastatin 40 MG tablet Commonly known as:  ZOCOR Take 40 mg by mouth daily.   tamsulosin 0.4 MG Caps capsule Commonly known as:  FLOMAX Take 0.4 mg by mouth.       Allergies:  Allergies  Allergen  Reactions  . Benadryl [Diphenhydramine] Other (See Comments)    "stuffy"  . Compazine [Prochlorperazine] Other (See Comments)    Dystonic rxn   . Depakote [Divalproex Sodium]   . Dramamine [Dimenhydrinate] Swelling    Family History: Family History  Problem Relation Age of Onset  . Prostate cancer Neg Hx   . Bladder Cancer Neg Hx   . Kidney cancer Neg Hx     Social History:  reports that he quit smoking about 7 years ago. He smoked 1.00 pack per day. He has quit using smokeless tobacco. He reports that he does not drink alcohol or use drugs.  ROS: UROLOGY Frequent Urination?: Yes Hard to postpone urination?: Yes Burning/pain with urination?: No Get up at night to urinate?: Yes Leakage of urine?: No Urine stream starts and stops?: Yes Trouble starting stream?: No Do you have to strain to urinate?: No Blood in urine?: No Urinary tract infection?: No Sexually transmitted disease?: No Injury to kidneys or bladder?: No Painful intercourse?: No Weak stream?: No Erection problems?: Yes Penile pain?: No  Gastrointestinal Nausea?: No Vomiting?: No Indigestion/heartburn?: Yes Diarrhea?: Yes Constipation?: Yes  Constitutional Fever: No Night sweats?: No Weight loss?: No Fatigue?: Yes  Skin Skin rash/lesions?: No Itching?: No  Eyes Blurred vision?: No Double vision?: No  Ears/Nose/Throat Sore throat?: No Sinus problems?: Yes  Hematologic/Lymphatic Swollen glands?: No Easy bruising?: No  Cardiovascular Leg swelling?: No Chest pain?: Yes  Respiratory Cough?: No Shortness of breath?: Yes  Endocrine Excessive thirst?: No  Musculoskeletal Back pain?: Yes Joint pain?: Yes  Neurological Headaches?: No Dizziness?: Yes  Psychologic Depression?: Yes Anxiety?: Yes  Physical Exam: BP 118/75 (BP Location: Right Arm, Patient Position: Sitting, Cuff Size: Normal)   Pulse 84   Ht 6' (1.829 m)   Wt 214 lb 4.8 oz (97.2 kg)   BMI 29.06 kg/m     Constitutional:  Alert and oriented, No acute distress. HEENT: [place] AT, moist mucus membranes.  Trachea midline, no masses. Cardiovascular: No clubbing, cyanosis, or edema. Respiratory: Normal respiratory effort, no increased work of breathing. GI: Abdomen is soft, nontender, nondistended, no abdominal masses GU: No CVA tenderness.  Skin: No rashes, bruises or suspicious lesions. Lymph: No cervical or inguinal adenopathy. Neurologic: Grossly intact, no focal deficits, moving all 4 extremities. Psychiatric: Normal mood and affect.  Laboratory Data: Lab Results  Component Value Date   WBC 10.0 06/04/2017   HGB 12.4 (L) 06/04/2017   HCT 38.1 (L) 06/04/2017   MCV 82.1 06/04/2017   PLT 351 06/04/2017    Lab Results  Component Value Date   CREATININE 1.14 06/04/2017  He had a normal PSA and testosterone level.  Lab Results  Component Value Date  HGBA1C 6.3 09/11/2014    Urinalysis Lab Results  Component Value Date   APPEARANCEUR CLEAR (A) 04/19/2015   LEUKOCYTESUR 1+ (A) 04/19/2015   PROTEINUR NEGATIVE 04/19/2015   GLUCOSEU NEGATIVE 04/19/2015   RBCU NONE SEEN 04/19/2015   BILIRUBINUR NEGATIVE 04/19/2015   NITRITE NEGATIVE 04/19/2015    Lab Results  Component Value Date   BACTERIA NONE SEEN 04/19/2015    Assessment & Plan:    1.  Erectile dysfunction Because of a daily isosorbide nitrate dose he is not a candidate for PDE 5 inhibitors.  I discussed other alternatives including intracavernosal injections and vacuum erection devices.  He is not interested in intracavernosal injections and this may also be cost prohibitive.  He was given literature on vacuum erection devices.  2. BPH with lower urinary tract symptoms Stable on tamsulosin.  He states his voiding symptoms are not bothersome.   Return if symptoms worsen or fail to improve.  Riki AltesScott C Murl Zogg, MD  [facility] [address], [address] [place], [place] [address] [phone]  [phone]

## 2017-08-27 ENCOUNTER — Encounter: Payer: Self-pay | Admitting: Urology

## 2017-09-16 ENCOUNTER — Emergency Department
Admission: EM | Admit: 2017-09-16 | Discharge: 2017-09-16 | Disposition: A | Discharge status: Home / Self Care | Payer: Medicare HMO | Attending: Emergency Medicine | Admitting: Emergency Medicine

## 2017-09-16 ENCOUNTER — Encounter: Payer: Self-pay | Admitting: Emergency Medicine

## 2017-09-16 DIAGNOSIS — Z5321 Procedure and treatment not carried out due to patient leaving prior to being seen by health care provider: Secondary | ICD-10-CM | POA: Diagnosis not present

## 2017-09-16 DIAGNOSIS — R531 Weakness: Secondary | ICD-10-CM | POA: Diagnosis not present

## 2017-09-16 LAB — URINALYSIS, COMPLETE (UACMP) WITH MICROSCOPIC
BACTERIA UA: NONE SEEN
BILIRUBIN URINE: NEGATIVE
GLUCOSE, UA: NEGATIVE mg/dL
HGB URINE DIPSTICK: NEGATIVE
KETONES UR: NEGATIVE mg/dL
LEUKOCYTES UA: NEGATIVE
NITRITE: NEGATIVE
PH: 7 (ref 5.0–8.0)
Protein, ur: NEGATIVE mg/dL
SPECIFIC GRAVITY, URINE: 1.003 — AB (ref 1.005–1.030)
Squamous Epithelial / LPF: NONE SEEN

## 2017-09-16 LAB — BASIC METABOLIC PANEL
ANION GAP: 10 (ref 5–15)
BUN: 13 mg/dL (ref 6–20)
CALCIUM: 9.3 mg/dL (ref 8.9–10.3)
CO2: 23 mmol/L (ref 22–32)
Chloride: 99 mmol/L — ABNORMAL LOW (ref 101–111)
Creatinine, Ser: 1.38 mg/dL — ABNORMAL HIGH (ref 0.61–1.24)
GFR calc Af Amer: >60 mL/min (ref >60)
GFR, EST NON AFRICAN AMERICAN: 54 mL/min — AB (ref >60)
Glucose, Bld: 116 mg/dL — ABNORMAL HIGH (ref 65–99)
Potassium: 3.9 mmol/L (ref 3.5–5.1)
SODIUM: 132 mmol/L — AB (ref 135–145)

## 2017-09-16 LAB — CBC
HEMATOCRIT: 37.5 % — AB (ref 40.0–52.0)
HEMOGLOBIN: 12.4 g/dL — AB (ref 13.0–18.0)
MCH: 27 pg (ref 26.0–34.0)
MCHC: 33.2 g/dL (ref 32.0–36.0)
MCV: 81.6 fL (ref 80.0–100.0)
Platelets: 327 10*3/uL (ref 150–440)
RBC: 4.59 MIL/uL (ref 4.40–5.90)
RDW: 14.6 % — AB (ref 11.5–14.5)
WBC: 7.6 10*3/uL (ref 3.8–10.6)

## 2017-09-16 LAB — TROPONIN I: Troponin I: <0.03 ng/mL (ref <0.03)

## 2017-09-16 NOTE — ED Notes (Signed)
Called lab to add on Troponin 

## 2017-09-16 NOTE — ED Triage Notes (Signed)
Pt comes into the ED via [facility] from home c/o generalized weakness.  Denies any N/V/d, shortness of breath, chest pain, or dizziness.  Patient ambulatory with EMS VSS being stable. Patient has even and unlabored respirations and warm dry skin.  Patient in NAD at this time.  Patient has no other complaints other than feeling "weak".

## 2017-09-16 NOTE — ED Notes (Signed)
Patient states he is feeling some better, has an appointment in the morning he does not want to miss. States he does not wish to stay to be seen. Discussed lab results with patient.

## 2017-10-15 ENCOUNTER — Other Ambulatory Visit: Payer: Self-pay | Admitting: Cardiovascular Disease

## 2017-10-15 DIAGNOSIS — I2510 Atherosclerotic heart disease of native coronary artery without angina pectoris: Secondary | ICD-10-CM | POA: Insufficient documentation

## 2017-10-19 ENCOUNTER — Ambulatory Visit
Admission: RE | Admit: 2017-10-19 | Discharge: 2017-10-19 | Disposition: A | Discharge status: Home / Self Care | Payer: Medicare HMO | Source: Ambulatory Visit | Attending: Cardiovascular Disease | Admitting: Cardiovascular Disease

## 2017-10-19 ENCOUNTER — Encounter
Admission: RE | Disposition: A | Discharge status: Home / Self Care | Payer: Self-pay | Source: Ambulatory Visit | Attending: Cardiovascular Disease

## 2017-10-19 DIAGNOSIS — I10 Essential (primary) hypertension: Secondary | ICD-10-CM | POA: Insufficient documentation

## 2017-10-19 DIAGNOSIS — I2510 Atherosclerotic heart disease of native coronary artery without angina pectoris: Secondary | ICD-10-CM | POA: Insufficient documentation

## 2017-10-19 DIAGNOSIS — I70201 Unspecified atherosclerosis of native arteries of extremities, right leg: Secondary | ICD-10-CM | POA: Diagnosis not present

## 2017-10-19 DIAGNOSIS — I25110 Atherosclerotic heart disease of native coronary artery with unstable angina pectoris: Secondary | ICD-10-CM | POA: Insufficient documentation

## 2017-10-19 DIAGNOSIS — Z89429 Acquired absence of other toe(s), unspecified side: Secondary | ICD-10-CM | POA: Diagnosis not present

## 2017-10-19 DIAGNOSIS — Z79899 Other long term (current) drug therapy: Secondary | ICD-10-CM | POA: Diagnosis not present

## 2017-10-19 DIAGNOSIS — F209 Schizophrenia, unspecified: Secondary | ICD-10-CM | POA: Diagnosis not present

## 2017-10-19 DIAGNOSIS — E785 Hyperlipidemia, unspecified: Secondary | ICD-10-CM | POA: Diagnosis not present

## 2017-10-19 DIAGNOSIS — I200 Unstable angina: Secondary | ICD-10-CM

## 2017-10-19 DIAGNOSIS — Z7951 Long term (current) use of inhaled steroids: Secondary | ICD-10-CM | POA: Insufficient documentation

## 2017-10-19 DIAGNOSIS — Z888 Allergy status to other drugs, medicaments and biological substances status: Secondary | ICD-10-CM | POA: Diagnosis not present

## 2017-10-19 HISTORY — PX: LEFT HEART CATH: CATH118248

## 2017-10-19 SURGERY — LEFT HEART CATH
Anesthesia: Moderate Sedation | Laterality: Right

## 2017-10-19 MED ORDER — MIDAZOLAM HCL 2 MG/2ML IJ SOLN
INTRAMUSCULAR | Status: DC | PRN
  Administered 2017-10-19: 1 mg via INTRAVENOUS

## 2017-10-19 MED ORDER — SODIUM CHLORIDE 0.9 % WEIGHT BASED INFUSION
3.0000 mL/kg/h | INTRAVENOUS | Status: AC
  Administered 2017-10-19: 3 mL/kg/h via INTRAVENOUS

## 2017-10-19 MED ORDER — IOPAMIDOL (ISOVUE-300) INJECTION 61%
INTRAVENOUS | Status: DC | PRN
  Administered 2017-10-19: 60 mL via INTRA_ARTERIAL

## 2017-10-19 MED ORDER — HEPARIN (PORCINE) IN NACL 2-0.9 UNIT/ML-% IJ SOLN
INTRAMUSCULAR | Status: AC
  Filled 2017-10-19: qty 1000

## 2017-10-19 MED ORDER — ASPIRIN 81 MG PO CHEW: 81.0000 mg | CHEWABLE_TABLET | ORAL | Status: DC

## 2017-10-19 MED ORDER — ONDANSETRON HCL 4 MG/2ML IJ SOLN: 4.0000 mg | Freq: Four times a day (QID) | INTRAMUSCULAR | Status: DC | PRN

## 2017-10-19 MED ORDER — SODIUM CHLORIDE 0.9 % IV SOLN: 250.0000 mL | INTRAVENOUS | Status: DC | PRN

## 2017-10-19 MED ORDER — ACETAMINOPHEN 325 MG PO TABS: 650.0000 mg | ORAL_TABLET | ORAL | Status: DC | PRN

## 2017-10-19 MED ORDER — SODIUM CHLORIDE 0.9% FLUSH: 3.0000 mL | Freq: Two times a day (BID) | INTRAVENOUS | Status: DC

## 2017-10-19 MED ORDER — FENTANYL CITRATE (PF) 100 MCG/2ML IJ SOLN
INTRAMUSCULAR | Status: AC
  Filled 2017-10-19: qty 2

## 2017-10-19 MED ORDER — SODIUM CHLORIDE 0.9% FLUSH: 3.0000 mL | INTRAVENOUS | Status: DC | PRN

## 2017-10-19 MED ORDER — MIDAZOLAM HCL 2 MG/2ML IJ SOLN
INTRAMUSCULAR | Status: AC
  Filled 2017-10-19: qty 2

## 2017-10-19 MED ORDER — FENTANYL CITRATE (PF) 100 MCG/2ML IJ SOLN
INTRAMUSCULAR | Status: DC | PRN
  Administered 2017-10-19: 25 ug via INTRAVENOUS

## 2017-10-19 MED ORDER — SODIUM CHLORIDE 0.9 % WEIGHT BASED INFUSION: 1.0000 mL/kg/h | INTRAVENOUS | Status: DC

## 2017-10-19 SURGICAL SUPPLY — 8 items
CATH INFINITI 5FR ANG PIGTAIL (CATHETERS) ×2 IMPLANT
CATH INFINITI 5FR JL4 (CATHETERS) ×2 IMPLANT
CATH INFINITI JR4 5F (CATHETERS) ×2 IMPLANT
KIT MANI 3VAL PERCEP (MISCELLANEOUS) ×2 IMPLANT
NEEDLE PERC 18GX7CM (NEEDLE) ×2 IMPLANT
PACK CARDIAC CATH (CUSTOM PROCEDURE TRAY) ×2 IMPLANT
SHEATH PINNACLE 5F 10CM (SHEATH) ×2 IMPLANT
WIRE GUIDERIGHT .035X150 (WIRE) ×2 IMPLANT

## 2017-10-19 NOTE — Discharge Instructions (Signed)
Femoral Site Care °Refer to this sheet in the next few weeks. These instructions provide you with information about caring for yourself after your procedure. Your health care provider may also give you more specific instructions. Your treatment has been planned according to current medical practices, but problems sometimes occur. Call your health care provider if you have any problems or questions after your procedure. °What can I expect after the procedure? °After your procedure, it is typical to have the following: °· Bruising at the site that usually fades within 1-2 weeks. °· Blood collecting in the tissue (hematoma) that may be painful to the touch. It should usually decrease in size and tenderness within 1-2 weeks. ° °Follow these instructions at home: °· Take medicines only as directed by your health care provider. °· You may shower 24-48 hours after the procedure or as directed by your health care provider. Remove the bandage (dressing) and gently wash the site with plain soap and water. Pat the area dry with a clean towel. Do not rub the site, because this may cause bleeding. °· Do not take baths, swim, or use a hot tub until your health care provider approves. °· Check your insertion site every day for redness, swelling, or drainage. °· Do not apply powder or lotion to the site. °· Limit use of stairs to twice a day for the first 2-3 days or as directed by your health care provider. °· Do not squat for the first 2-3 days or as directed by your health care provider. °· Do not lift over 10 lb (4.5 kg) for 5 days after your procedure or as directed by your health care provider. °· Ask your health care provider when it is okay to: °? Return to work or school. °? Resume usual physical activities or sports. °? Resume sexual activity. °· Do not drive home if you are discharged the same day as the procedure. Have someone else drive you. °· You may drive 24 hours after the procedure unless otherwise instructed by  your health care provider. °· Do not operate machinery or power tools for 24 hours after the procedure or as directed by your health care provider. °· If your procedure was done as an outpatient procedure, which means that you went home the same day as your procedure, a responsible adult should be with you for the first 24 hours after you arrive home. °· Keep all follow-up visits as directed by your health care provider. This is important. °Contact a health care provider if: °· You have a fever. °· You have chills. °· You have increased bleeding from the site. Hold pressure on the site. °Get help right away if: °· You have unusual pain at the site. °· You have redness, warmth, or swelling at the site. °· You have drainage (other than a small amount of blood on the dressing) from the site. °· The site is bleeding, and the bleeding does not stop after 30 minutes of holding steady pressure on the site. °· Your leg or foot becomes pale, cool, tingly, or numb. °This information is not intended to replace advice given to you by your health care provider. Make sure you discuss any questions you have with your health care provider. °Document Released: 03/31/2014 Document Revised: 01/03/2016 Document Reviewed: 02/14/2014 °Elsevier Interactive Patient Education © 2018 [employer]vier Inc[employer] °Moderate Conscious Sedation, Adult, Care After °These instructions provide you with information about caring for yourself after your procedure. Your health care provider may also give you more   specific instructions. Your treatment has been planned according to current medical practices, but problems sometimes occur. Call your health care provider if you have any problems or questions after your procedure. What can I expect after the procedure? After your procedure, it is common:  To feel sleepy for several hours.  To feel clumsy and have poor balance for several hours.  To have poor judgment for several hours.  To vomit if you eat too  soon.  Follow these instructions at home: For at least 24 hours after the procedure:   Do not: ? Participate in activities where you could fall or become injured. ? Drive. ? Use heavy machinery. ? Drink alcohol. ? Take sleeping pills or medicines that cause drowsiness. ? Make important decisions or sign legal documents. ? Take care of children on your own.  Rest. Eating and drinking  Follow the diet recommended by your health care provider.  If you vomit: ? Drink water, juice, or soup when you can drink without vomiting. ? Make sure you have little or no nausea before eating solid foods. General instructions  Have a responsible adult stay with you until you are awake and alert.  Take over-the-counter and prescription medicines only as told by your health care provider.  If you smoke, do not smoke without supervision.  Keep all follow-up visits as told by your health care provider. This is important. Contact a health care provider if:  You keep feeling nauseous or you keep vomiting.  You feel light-headed.  You develop a rash.  You have a fever. Get help right away if:  You have trouble breathing. This information is not intended to replace advice given to you by your health care provider. Make sure you discuss any questions you have with your health care provider. Document Released: 05/18/2013 Document Revised: 12/31/2015 Document Reviewed: 11/17/2015 Elsevier Interactive Patient Education  2018 [employer] Angiogram, Care After This sheet gives you information about how to care for yourself after your procedure. Your health care provider may also give you more specific instructions. If you have problems or questions, contact your health care provider. What can I expect after the procedure? After the procedure, it is common to have bruising and tenderness at the catheter insertion area. Follow these instructions at home: Insertion site care  Follow  instructions from your health care provider about how to take care of your insertion site. Make sure you: ? Wash your hands with soap and water before you change your bandage (dressing). If soap and water are not available, use hand sanitizer. ? Change your dressing as told by your health care provider. ? Leave stitches (sutures), skin glue, or adhesive strips in place. These skin closures may need to stay in place for 2 weeks or longer. If adhesive strip edges start to loosen and curl up, you may trim the loose edges. Do not remove adhesive strips completely unless your health care provider tells you to do that.  Do not take baths, swim, or use a hot tub until your health care provider approves.  You may shower 24-48 hours after the procedure or as told by your health care provider. ? Gently wash the site with plain soap and water. ? Pat the area dry with a clean towel. ? Do not rub the site. This may cause bleeding.  Do not apply powder or lotion to the site. Keep the site clean and dry.  Check your insertion site every day for signs of infection. Check for: ?  Redness, swelling, or pain. ? Fluid or blood. ? Warmth. ? Pus or a bad smell. Activity  Rest as told by your health care provider, usually for 1-2 days.  Do not lift anything that is heavier than 10 lbs. (4.5 kg) or as told by your health care provider.  Do not drive for 24 hours if you were given a medicine to help you relax (sedative).  Do not drive or use heavy machinery while taking prescription pain medicine. General instructions  Return to your normal activities as told by your health care provider, usually in about a week. Ask your health care provider what activities are safe for you.  If the catheter site starts bleeding, lie flat and put pressure on the site. If the bleeding does not stop, get help right away. This is a medical emergency.  Drink enough fluid to keep your urine clear or pale yellow. This helps flush  the contrast dye from your body.  Take over-the-counter and prescription medicines only as told by your health care provider.  Keep all follow-up visits as told by your health care provider. This is important. Contact a health care provider if:  You have a fever or chills.  You have redness, swelling, or pain around your insertion site.  You have fluid or blood coming from your insertion site.  The insertion site feels warm to the touch.  You have pus or a bad smell coming from your insertion site.  You have bruising around the insertion site.  You notice blood collecting in the tissue around the catheter site (hematoma). The hematoma may be painful to the touch. Get help right away if:  You have severe pain at the catheter insertion area.  The catheter insertion area swells very fast.  The catheter insertion area is bleeding, and the bleeding does not stop when you hold steady pressure on the area.  The area near or just beyond the catheter insertion site becomes pale, cool, tingly, or numb. These symptoms may represent a serious problem that is an emergency. Do not wait to see if the symptoms will go away. Get medical help right away. Call your local emergency services (911 in the [place] Do not drive yourself to the hospital. Summary  After the procedure, it is common to have bruising and tenderness at the catheter insertion area.  After the procedure, it is important to rest and drink plenty of fluids.  Do not take baths, swim, or use a hot tub until your health care provider says it is okay to do so. You may shower 24-48 hours after the procedure or as told by your health care provider.  If the catheter site starts bleeding, lie flat and put pressure on the site. If the bleeding does not stop, get help right away. This is a medical emergency. This information is not intended to replace advice given to you by your health care provider. Make sure you discuss any questions you  have with your health care provider. Document Released: 02/13/2005 Document Revised: 07/02/2016 Document Reviewed: 07/02/2016 Elsevier Interactive Patient Education  [employer].

## 2017-10-20 ENCOUNTER — Encounter: Payer: Self-pay | Admitting: Cardiovascular Disease

## 2017-11-19 ENCOUNTER — Ambulatory Visit (INDEPENDENT_AMBULATORY_CARE_PROVIDER_SITE_OTHER): Payer: Medicare HMO | Admitting: Vascular Surgery

## 2017-11-19 ENCOUNTER — Encounter (INDEPENDENT_AMBULATORY_CARE_PROVIDER_SITE_OTHER): Payer: Self-pay | Admitting: Vascular Surgery

## 2017-11-19 VITALS — BP 114/74 | HR 83 | Resp 16 | Ht 72.0 in | Wt 212.0 lb

## 2017-11-19 DIAGNOSIS — E782 Mixed hyperlipidemia: Secondary | ICD-10-CM

## 2017-11-19 DIAGNOSIS — I25118 Atherosclerotic heart disease of native coronary artery with other forms of angina pectoris: Secondary | ICD-10-CM | POA: Diagnosis not present

## 2017-11-19 DIAGNOSIS — I10 Essential (primary) hypertension: Secondary | ICD-10-CM | POA: Diagnosis not present

## 2017-11-19 DIAGNOSIS — J449 Chronic obstructive pulmonary disease, unspecified: Secondary | ICD-10-CM

## 2017-11-19 DIAGNOSIS — I70213 Atherosclerosis of native arteries of extremities with intermittent claudication, bilateral legs: Secondary | ICD-10-CM | POA: Diagnosis not present

## 2017-11-22 ENCOUNTER — Encounter (INDEPENDENT_AMBULATORY_CARE_PROVIDER_SITE_OTHER): Payer: Self-pay | Admitting: Vascular Surgery

## 2017-11-22 DIAGNOSIS — J449 Chronic obstructive pulmonary disease, unspecified: Secondary | ICD-10-CM | POA: Insufficient documentation

## 2017-11-22 DIAGNOSIS — E785 Hyperlipidemia, unspecified: Secondary | ICD-10-CM | POA: Insufficient documentation

## 2017-11-22 DIAGNOSIS — I70219 Atherosclerosis of native arteries of extremities with intermittent claudication, unspecified extremity: Secondary | ICD-10-CM | POA: Insufficient documentation

## 2017-11-22 DIAGNOSIS — I10 Essential (primary) hypertension: Secondary | ICD-10-CM | POA: Insufficient documentation

## 2017-11-22 NOTE — Progress Notes (Signed)
MRN : [id number]  Brandon [name] is a 62 y.o. ([birth year]) male who presents with chief complaint of  Chief Complaint  Patient presents with  . New Patient (Initial Visit)    ref Welton FlakesKhan for PVD  .  History of Present Illness:  The patient is seen for evaluation of painful lower extremities and diminished pulses. Patient notes the pain is always associated with activity and is very consistent day today. Typically, the pain occurs at less than one block, progress is as activity continues to the point that the patient must stop walking. Resting including standing still for several minutes allowed resumption of the activity and the ability to walk a similar distance before stopping again. Uneven terrain and inclined shorten the distance. The pain has been progressive over the past several years.  The patient denies that his inability to walk is now having a negative impact on quality of life and daily activities.  He denies lifestyle limitations.  The patient denies rest pain or dangling of an extremity off the side of the bed during the night for relief. No open wounds or sores at this time. No prior interventions or surgeries.  No history of back problems or DJD of the lumbar sacral spine.   The patient denies changes in claudication symptoms or new rest pain symptoms.  No new ulcers or wounds of the foot.  The patient's blood pressure has been stable and relatively well controlled. The patient denies amaurosis fugax or recent TIA symptoms. There are no recent neurological changes noted. The patient denies history of DVT, PE or superficial thrombophlebitis. The patient denies recent episodes of angina or shortness of breath.   CT angiogram is reviewed by myself and demonstrates diffuse hemodynamically significant lesions.   Current Meds  Medication Sig  . acetaminophen (TYLENOL) 500 MG tablet Take 1,000 mg by mouth every 6 (six) hours as needed for moderate pain or headache.  Marland Kitchen.  amLODipine (NORVASC) 5 MG tablet Take 5 mg by mouth daily.  Marland Kitchen. aspirin EC 81 MG tablet Take 324 mg by mouth 2 (two) times daily.  Marland Kitchen. buPROPion (WELLBUTRIN SR) 100 MG 12 hr tablet Take 100 mg by mouth daily.   . carvedilol (COREG) 25 MG tablet Take 25 mg by mouth 2 (two) times daily with a meal.  . cloZAPine (CLOZARIL) 100 MG tablet Take 150 mg by mouth at bedtime.   Marland Kitchen. diltiazem (CARDIZEM CD) 120 MG 24 hr capsule Take 120 mg by mouth daily.  Marland Kitchen. ezetimibe (ZETIA) 10 MG tablet Take 10 mg by mouth at bedtime.   . famotidine (PEPCID) 20 MG tablet Take 20 mg by mouth 2 (two) times daily.  . fenofibrate 160 MG tablet Take 160 mg by mouth daily.  . isosorbide mononitrate (IMDUR) 30 MG 24 hr tablet Take 30 mg by mouth 2 (two) times daily.   Marland Kitchen. latanoprost (XALATAN) 0.005 % ophthalmic solution Place 1 drop into both eyes at bedtime.  . magnesium hydroxide (MILK OF MAGNESIA) 400 MG/5ML suspension Take 5 mLs by mouth daily.  Marland Kitchen. senna (SENOKOT) 8.6 MG TABS tablet Take 1 tablet by mouth 2 (two) times daily.  . simvastatin (ZOCOR) 40 MG tablet Take 40 mg by mouth at bedtime.   . SYMBICORT 80-4.5 MCG/ACT inhaler Inhale 2 puffs into the lungs 2 (two) times daily.  . tamsulosin (FLOMAX) 0.4 MG CAPS capsule Take 0.4 mg by mouth daily.   . timolol (TIMOPTIC) 0.5 % ophthalmic solution Place 1 drop into both eyes  daily.    Past Medical History:  Diagnosis Date  . Anemia    IDA  . Colon polyps   . COPD (chronic obstructive pulmonary disease) (HCC)   . Coronary artery disease   . Heart attack (HCC)   . Hiatal hernia   . History of ETOH abuse   . Hyperlipidemia   . Hypertension   . Pleurisy   . Schizophrenia (HCC)   . Schizophrenia (HCC)   . Seizures (HCC)   . Suicide attempt  [facility])     Past Surgical History:  Procedure Laterality Date  . ABDOMINAL SURGERY     internal bleeding  . COLONOSCOPY    . COLONOSCOPY WITH PROPOFOL N/A 05/09/2016   Procedure: COLONOSCOPY WITH PROPOFOL;  Surgeon: Scot Jun,  MD;  Location: [facility] ENDOSCOPY;  Service: Endoscopy;  Laterality: N/A;  . CORONARY ANGIOPLASTY WITH STENT PLACEMENT    . ESOPHAGOGASTRODUODENOSCOPY (EGD) WITH PROPOFOL N/A 05/09/2016   Procedure: ESOPHAGOGASTRODUODENOSCOPY (EGD) WITH PROPOFOL;  Surgeon: Scot Jun, MD;  Location: [facility] ENDOSCOPY;  Service: Endoscopy;  Laterality: N/A;  . LEFT HEART CATH Right 10/19/2017   Procedure: Left Heart Cath and Coronary Angiography;  Surgeon: Laurier Nancy, MD;  Location: [facility] INVASIVE CV LAB;  Service: Cardiovascular;  Laterality: Right;  . NOSE SURGERY    . TOE AMPUTATION    . VASECTOMY      Social History Social History   Tobacco Use  . Smoking status: Former Smoker    Packs/day: 1.00    Last attempt to quit: 2012    Years since quitting: 7.2  . Smokeless tobacco: Former Engineer, water Use Topics  . Alcohol use: No  . Drug use: No    Family History Family History  Problem Relation Age of Onset  . Prostate cancer Neg Hx   . Bladder Cancer Neg Hx   . Kidney cancer Neg Hx   No family history of bleeding/clotting disorders, porphyria or autoimmune disease   Allergies  Allergen Reactions  . Alcohol-Sulfur [Sulfur] Other (See Comments)    Unknown  . Benadryl [Diphenhydramine] Other (See Comments)    "stuffy"  . Compazine [Prochlorperazine] Other (See Comments)    Dystonic rxn   . Depakote [Divalproex Sodium] Other (See Comments)    Unknown  . Dramamine [Dimenhydrinate] Swelling     REVIEW OF SYSTEMS (Negative unless checked)  Constitutional: [] Weight loss  [] Fever  [] Chills Cardiac: [] Chest pain   [] Chest pressure   [] Palpitations   [] Shortness of breath when laying flat   [] Shortness of breath with exertion. Vascular:  [x] Pain in legs with walking   [] Pain in legs at rest  [] History of DVT   [] Phlebitis   [] Swelling in legs   [] Varicose veins   [] Non-healing ulcers Pulmonary:   [] Uses home oxygen   [] Productive cough   [] Hemoptysis   [] Wheeze  [] COPD    [] Asthma Neurologic:  [] Dizziness   [] Seizures   [] History of stroke   [] History of TIA  [] Aphasia   [] Vissual changes   [] Weakness or numbness in arm   [] Weakness or numbness in leg Musculoskeletal:   [] Joint swelling   [] Joint pain   [] Low back pain Hematologic:  [] Easy bruising  [] Easy bleeding   [] Hypercoagulable state   [] Anemic Gastrointestinal:  [] Diarrhea   [] Vomiting  [] Gastroesophageal reflux/heartburn   [] Difficulty swallowing. Genitourinary:  [] Chronic kidney disease   [] Difficult urination  [] Frequent urination   [] Blood in urine Skin:  [] Rashes   [] Ulcers  Psychological:  [] History of anxiety   []   History of major depression.  Physical Examination  Vitals:   11/19/17 1318  BP: 114/74  Pulse: 83  Resp: 16  Weight: 212 lb (96.2 kg)  Height: 6' (1.829 m)   Body mass index is 28.75 kg/m. Gen: WD/WN, NAD Head: [place]/AT, No temporalis wasting.  Ear/Nose/Throat: Hearing grossly intact, nares w/o erythema or drainage, poor dentition Eyes: PER, EOMI, sclera nonicteric.  Neck: Supple, no masses.  No bruit or JVD.  Pulmonary:  Good air movement, clear to auscultation bilaterally, no use of accessory muscles.  Cardiac: RRR, normal S1, S2, no Murmurs. Vascular:  Vessel Right Left  Radial Palpable Palpable  Ulnar Palpable Palpable  Brachial Palpable Palpable  Carotid Palpable Palpable  Femoral Trace Palpable Trace Palpable  Popliteal Not Palpable Not Palpable  PT Not Palpable Not Palpable  DP Not Palpable Not Palpable  Gastrointestinal: soft, non-distended. No guarding/no peritoneal signs.  Musculoskeletal: M/S 5/5 throughout.  No deformity or atrophy.  Neurologic: CN 2-12 intact. Pain and light touch intact in extremities.  Symmetrical.  Speech is fluent. Motor exam as listed above. Psychiatric: Judgment intact, Mood & affect appropriate for pt's clinical situation. Dermatologic: No rashes or ulcers noted.  No changes consistent with cellulitis. Lymph : No Cervical  lymphadenopathy, no lichenification or skin changes of chronic lymphedema.  CBC Lab Results  Component Value Date   WBC 7.6 09/16/2017   HGB 12.4 (L) 09/16/2017   HCT 37.5 (L) 09/16/2017   MCV 81.6 09/16/2017   PLT 327 09/16/2017    BMET    Component Value Date/Time   NA 132 (L) 09/16/2017 2028   K 3.9 09/16/2017 2028   CL 99 (L) 09/16/2017 2028   CO2 23 09/16/2017 2028   GLUCOSE 116 (H) 09/16/2017 2028   BUN 13 09/16/2017 2028   CREATININE 1.38 (H) 09/16/2017 2028   CALCIUM 9.3 09/16/2017 2028   GFRNONAA 54 (L) 09/16/2017 2028   GFRAA >60 09/16/2017 2028   CrCl cannot be calculated (Patient's most recent lab result is older than the maximum 21 days allowed.).  COAG Lab Results  Component Value Date   INR 0.93 05/09/2016    Radiology No results found.   Assessment/Plan 1. Atherosclerosis of native artery of both lower extremities with intermittent claudication (HCC)  Recommend:  The patient has evidence of atherosclerosis of the lower extremities with claudication.  The patient does not voice lifestyle limiting changes at this point in time.  He does not have any ulcerations and he is denying rest pain.  Given that he does not feel that his claudication symptoms are having a profound negative impact he has decided not to pursue intervention at this time.  No invasive studies, angiography or surgery at this time The patient should continue walking and begin a more formal exercise program.  The patient should continue antiplatelet therapy and aggressive treatment of the lipid abnormalities  No changes in the patient's medications at this time  The patient should continue wearing graduated compression socks 10-15 mmHg strength to control the mild edema.   - VAS [place] ABI WITH/WO TBI; Future  2. Coronary artery disease of native artery of native heart with stable angina pectoris (HCC) Continue cardiac and antihypertensive medications as already ordered and reviewed, no  changes at this time.  Continue statin as ordered and reviewed, no changes at this time  Nitrates PRN for chest pain   3. Essential hypertension Continue antihypertensive medications as already ordered, these medications have been reviewed and there are no changes at  this time.   4. Mixed hyperlipidemia Continue statin as ordered and reviewed, no changes at this time   5. Chronic obstructive pulmonary disease, unspecified COPD type (HCC) Continue pulmonary medications and aerosols as already ordered, these medications have been reviewed and there are no changes at this time.      Levora Dredge, MD  11/22/2017 2:05 PM

## 2017-11-25 ENCOUNTER — Encounter (INDEPENDENT_AMBULATORY_CARE_PROVIDER_SITE_OTHER): Payer: Self-pay

## 2018-04-08 ENCOUNTER — Emergency Department: Payer: Medicare Other

## 2018-04-08 ENCOUNTER — Other Ambulatory Visit: Payer: Self-pay

## 2018-04-08 ENCOUNTER — Inpatient Hospital Stay
Admission: EM | Admit: 2018-04-08 | Discharge: 2018-04-12 | DRG: 446 | Disposition: A | Discharge status: Home / Self Care | Payer: Medicare Other | Attending: Surgery | Admitting: Surgery

## 2018-04-08 DIAGNOSIS — F329 Major depressive disorder, single episode, unspecified: Secondary | ICD-10-CM | POA: Diagnosis not present

## 2018-04-08 DIAGNOSIS — I10 Essential (primary) hypertension: Secondary | ICD-10-CM | POA: Diagnosis not present

## 2018-04-08 DIAGNOSIS — E860 Dehydration: Secondary | ICD-10-CM | POA: Diagnosis present

## 2018-04-08 DIAGNOSIS — Z955 Presence of coronary angioplasty implant and graft: Secondary | ICD-10-CM

## 2018-04-08 DIAGNOSIS — K812 Acute cholecystitis with chronic cholecystitis: Secondary | ICD-10-CM | POA: Diagnosis not present

## 2018-04-08 DIAGNOSIS — Z7951 Long term (current) use of inhaled steroids: Secondary | ICD-10-CM

## 2018-04-08 DIAGNOSIS — Z87891 Personal history of nicotine dependence: Secondary | ICD-10-CM | POA: Diagnosis not present

## 2018-04-08 DIAGNOSIS — E785 Hyperlipidemia, unspecified: Secondary | ICD-10-CM | POA: Diagnosis present

## 2018-04-08 DIAGNOSIS — I252 Old myocardial infarction: Secondary | ICD-10-CM

## 2018-04-08 DIAGNOSIS — F209 Schizophrenia, unspecified: Secondary | ICD-10-CM | POA: Diagnosis present

## 2018-04-08 DIAGNOSIS — Z79899 Other long term (current) drug therapy: Secondary | ICD-10-CM

## 2018-04-08 DIAGNOSIS — Z7982 Long term (current) use of aspirin: Secondary | ICD-10-CM

## 2018-04-08 DIAGNOSIS — F419 Anxiety disorder, unspecified: Secondary | ICD-10-CM | POA: Diagnosis not present

## 2018-04-08 DIAGNOSIS — K810 Acute cholecystitis: Secondary | ICD-10-CM | POA: Diagnosis present

## 2018-04-08 DIAGNOSIS — I2510 Atherosclerotic heart disease of native coronary artery without angina pectoris: Secondary | ICD-10-CM | POA: Diagnosis not present

## 2018-04-08 DIAGNOSIS — R1084 Generalized abdominal pain: Secondary | ICD-10-CM

## 2018-04-08 DIAGNOSIS — J449 Chronic obstructive pulmonary disease, unspecified: Secondary | ICD-10-CM | POA: Diagnosis not present

## 2018-04-08 DIAGNOSIS — R112 Nausea with vomiting, unspecified: Secondary | ICD-10-CM

## 2018-04-08 DIAGNOSIS — K811 Chronic cholecystitis: Secondary | ICD-10-CM | POA: Diagnosis present

## 2018-04-08 LAB — COMPREHENSIVE METABOLIC PANEL
ALK PHOS: 63 U/L (ref 38–126)
ALT: 13 U/L (ref 0–44)
AST: 27 U/L (ref 15–41)
Albumin: 3.7 g/dL (ref 3.5–5.0)
Anion gap: 12 (ref 5–15)
BUN: 7 mg/dL — AB (ref 8–23)
CHLORIDE: 95 mmol/L — AB (ref 98–111)
CO2: 25 mmol/L (ref 22–32)
Calcium: 9.4 mg/dL (ref 8.9–10.3)
Creatinine, Ser: 1.28 mg/dL — ABNORMAL HIGH (ref 0.61–1.24)
GFR calc Af Amer: >60 mL/min (ref >60)
GFR, EST NON AFRICAN AMERICAN: 58 mL/min — AB (ref >60)
GLUCOSE: 106 mg/dL — AB (ref 70–99)
Potassium: 4.1 mmol/L (ref 3.5–5.1)
Sodium: 132 mmol/L — ABNORMAL LOW (ref 135–145)
TOTAL PROTEIN: 7 g/dL (ref 6.5–8.1)
Total Bilirubin: 0.6 mg/dL (ref 0.3–1.2)

## 2018-04-08 LAB — CBC WITH DIFFERENTIAL/PLATELET
BASOS ABS: 0 10*3/uL (ref 0–0.1)
Basophils Relative: 0 %
EOS PCT: 1 %
Eosinophils Absolute: 0.1 10*3/uL (ref 0–0.7)
HEMATOCRIT: 33.1 % — AB (ref 40.0–52.0)
Hemoglobin: 10.9 g/dL — ABNORMAL LOW (ref 13.0–18.0)
LYMPHS ABS: 1.1 10*3/uL (ref 1.0–3.6)
LYMPHS PCT: 10 %
MCH: 26.5 pg (ref 26.0–34.0)
MCHC: 33.1 g/dL (ref 32.0–36.0)
MCV: 80 fL (ref 80.0–100.0)
MONO ABS: 1.2 10*3/uL — AB (ref 0.2–1.0)
MONOS PCT: 11 %
NEUTROS ABS: 8.7 10*3/uL — AB (ref 1.4–6.5)
Neutrophils Relative %: 78 %
PLATELETS: 471 10*3/uL — AB (ref 150–440)
RBC: 4.13 MIL/uL — ABNORMAL LOW (ref 4.40–5.90)
RDW: 15.1 % — ABNORMAL HIGH (ref 11.5–14.5)
WBC: 11 10*3/uL — ABNORMAL HIGH (ref 3.8–10.6)

## 2018-04-08 LAB — LIPASE, BLOOD: Lipase: 28 U/L (ref 11–51)

## 2018-04-08 MED ORDER — TIMOLOL MALEATE 0.5 % OP SOLN
1.0000 [drp] | Freq: Every day | OPHTHALMIC | Status: DC
  Administered 2018-04-08 – 2018-04-11 (×4): 1 [drp] via OPHTHALMIC
  Filled 2018-04-08: qty 5

## 2018-04-08 MED ORDER — ENOXAPARIN SODIUM 40 MG/0.4ML ~~LOC~~ SOLN
40.0000 mg | SUBCUTANEOUS | Status: DC
  Administered 2018-04-08 – 2018-04-11 (×4): 40 mg via SUBCUTANEOUS
  Filled 2018-04-08 (×4): qty 0.4

## 2018-04-08 MED ORDER — HYDROCODONE-ACETAMINOPHEN 5-325 MG PO TABS
1.0000 | ORAL_TABLET | ORAL | Status: DC | PRN
  Administered 2018-04-09 – 2018-04-11 (×6): 1 via ORAL
  Filled 2018-04-08: qty 2
  Filled 2018-04-08 (×4): qty 1
  Filled 2018-04-08: qty 2

## 2018-04-08 MED ORDER — FAMOTIDINE 20 MG PO TABS
20.0000 mg | ORAL_TABLET | Freq: Two times a day (BID) | ORAL | Status: DC
  Administered 2018-04-08 – 2018-04-11 (×7): 20 mg via ORAL
  Filled 2018-04-08 (×8): qty 1

## 2018-04-08 MED ORDER — LATANOPROST 0.005 % OP SOLN
1.0000 [drp] | Freq: Every day | OPHTHALMIC | Status: DC
  Administered 2018-04-08 – 2018-04-11 (×4): 1 [drp] via OPHTHALMIC
  Filled 2018-04-08: qty 2.5

## 2018-04-08 MED ORDER — ONDANSETRON HCL 4 MG/2ML IJ SOLN: 4.0000 mg | Freq: Four times a day (QID) | INTRAMUSCULAR | Status: DC | PRN

## 2018-04-08 MED ORDER — MOMETASONE FURO-FORMOTEROL FUM 100-5 MCG/ACT IN AERO
2.0000 | INHALATION_SPRAY | Freq: Two times a day (BID) | RESPIRATORY_TRACT | Status: DC
  Administered 2018-04-08 – 2018-04-11 (×7): 2 via RESPIRATORY_TRACT
  Filled 2018-04-08: qty 8.8

## 2018-04-08 MED ORDER — SENNA 8.6 MG PO TABS
1.0000 | ORAL_TABLET | Freq: Two times a day (BID) | ORAL | Status: DC
  Administered 2018-04-08 – 2018-04-11 (×7): 8.6 mg via ORAL
  Filled 2018-04-08 (×7): qty 1

## 2018-04-08 MED ORDER — CARVEDILOL 25 MG PO TABS
25.0000 mg | ORAL_TABLET | Freq: Two times a day (BID) | ORAL | Status: DC
  Administered 2018-04-09 – 2018-04-12 (×7): 25 mg via ORAL
  Filled 2018-04-08 (×7): qty 1

## 2018-04-08 MED ORDER — GI COCKTAIL ~~LOC~~
30.0000 mL | ORAL | Status: AC
  Administered 2018-04-08: 30 mL via ORAL
  Filled 2018-04-08: qty 30

## 2018-04-08 MED ORDER — ONDANSETRON 4 MG PO TBDP: 4.0000 mg | ORAL_TABLET | Freq: Three times a day (TID) | ORAL | 0 refills | Status: DC | PRN

## 2018-04-08 MED ORDER — FAMOTIDINE 20 MG PO TABS
40.0000 mg | ORAL_TABLET | Freq: Once | ORAL | Status: AC
  Administered 2018-04-08: 40 mg via ORAL
  Filled 2018-04-08: qty 2

## 2018-04-08 MED ORDER — POLYETHYLENE GLYCOL 3350 17 G PO PACK
17.0000 g | PACK | Freq: Once | ORAL | Status: DC
  Filled 2018-04-08 (×4): qty 1

## 2018-04-08 MED ORDER — FENOFIBRATE 160 MG PO TABS
160.0000 mg | ORAL_TABLET | Freq: Every day | ORAL | Status: DC
  Administered 2018-04-08 – 2018-04-11 (×4): 160 mg via ORAL
  Filled 2018-04-08 (×5): qty 1

## 2018-04-08 MED ORDER — ALUMINUM-MAGNESIUM-SIMETHICONE 200-200-20 MG/5ML PO SUSP: 30.0000 mL | Freq: Three times a day (TID) | ORAL | 0 refills | Status: DC

## 2018-04-08 MED ORDER — LACTATED RINGERS IV SOLN
INTRAVENOUS | Status: DC
  Administered 2018-04-08 – 2018-04-11 (×8): via INTRAVENOUS

## 2018-04-08 MED ORDER — METOCLOPRAMIDE HCL 5 MG/ML IJ SOLN
10.0000 mg | Freq: Once | INTRAMUSCULAR | Status: AC
  Administered 2018-04-08: 10 mg via INTRAVENOUS
  Filled 2018-04-08: qty 2

## 2018-04-08 MED ORDER — METOPROLOL SUCCINATE ER 25 MG PO TB24
25.0000 mg | ORAL_TABLET | Freq: Every day | ORAL | Status: DC
  Administered 2018-04-09 – 2018-04-11 (×3): 25 mg via ORAL
  Filled 2018-04-08 (×3): qty 1

## 2018-04-08 MED ORDER — SODIUM CHLORIDE 0.9 % IV SOLN
2.0000 g | INTRAVENOUS | Status: DC
  Administered 2018-04-08 – 2018-04-11 (×4): 2 g via INTRAVENOUS
  Filled 2018-04-08: qty 2
  Filled 2018-04-08 (×2): qty 20
  Filled 2018-04-08 (×2): qty 2

## 2018-04-08 MED ORDER — DOCUSATE SODIUM 100 MG PO CAPS: 100.0000 mg | ORAL_CAPSULE | Freq: Two times a day (BID) | ORAL | Status: DC | PRN

## 2018-04-08 MED ORDER — METOCLOPRAMIDE HCL 10 MG PO TABS: 10.0000 mg | ORAL_TABLET | Freq: Once | ORAL | Status: DC

## 2018-04-08 MED ORDER — PAROXETINE HCL 20 MG PO TABS
20.0000 mg | ORAL_TABLET | Freq: Every day | ORAL | Status: DC
  Administered 2018-04-08 – 2018-04-09 (×2): 20 mg via ORAL
  Filled 2018-04-08 (×2): qty 1

## 2018-04-08 MED ORDER — DILTIAZEM HCL ER COATED BEADS 120 MG PO CP24
120.0000 mg | ORAL_CAPSULE | Freq: Every day | ORAL | Status: DC
  Administered 2018-04-09: 120 mg via ORAL
  Filled 2018-04-08: qty 1

## 2018-04-08 MED ORDER — MORPHINE SULFATE (PF) 2 MG/ML IV SOLN: 2.0000 mg | INTRAVENOUS | Status: DC | PRN

## 2018-04-08 MED ORDER — EZETIMIBE 10 MG PO TABS
10.0000 mg | ORAL_TABLET | Freq: Every day | ORAL | Status: DC
  Administered 2018-04-08 – 2018-04-11 (×4): 10 mg via ORAL
  Filled 2018-04-08 (×5): qty 1

## 2018-04-08 MED ORDER — FAMOTIDINE 20 MG PO TABS: 20.0000 mg | ORAL_TABLET | Freq: Two times a day (BID) | ORAL | 0 refills | Status: DC

## 2018-04-08 MED ORDER — TAMSULOSIN HCL 0.4 MG PO CAPS
0.4000 mg | ORAL_CAPSULE | Freq: Every day | ORAL | Status: DC
  Administered 2018-04-09 – 2018-04-11 (×3): 0.4 mg via ORAL
  Filled 2018-04-08 (×3): qty 1

## 2018-04-08 MED ORDER — AMLODIPINE BESYLATE 5 MG PO TABS
5.0000 mg | ORAL_TABLET | Freq: Every day | ORAL | Status: DC
  Filled 2018-04-08 (×2): qty 1

## 2018-04-08 MED ORDER — SODIUM CHLORIDE 0.9 % IV BOLUS
1000.0000 mL | Freq: Once | INTRAVENOUS | Status: AC
  Administered 2018-04-08: 1000 mL via INTRAVENOUS

## 2018-04-08 MED ORDER — BUPROPION HCL ER (SR) 100 MG PO TB12
100.0000 mg | ORAL_TABLET | Freq: Every day | ORAL | Status: DC
  Administered 2018-04-08 – 2018-04-11 (×4): 100 mg via ORAL
  Filled 2018-04-08 (×5): qty 1

## 2018-04-08 MED ORDER — SIMVASTATIN 40 MG PO TABS
40.0000 mg | ORAL_TABLET | Freq: Every day | ORAL | Status: DC
  Administered 2018-04-08 – 2018-04-11 (×4): 40 mg via ORAL
  Filled 2018-04-08 (×5): qty 1

## 2018-04-08 MED ORDER — ISOSORBIDE MONONITRATE ER 30 MG PO TB24
30.0000 mg | ORAL_TABLET | Freq: Two times a day (BID) | ORAL | Status: DC
  Administered 2018-04-08 – 2018-04-11 (×7): 30 mg via ORAL
  Filled 2018-04-08 (×7): qty 1

## 2018-04-08 MED ORDER — ONDANSETRON 4 MG PO TBDP: 4.0000 mg | ORAL_TABLET | Freq: Four times a day (QID) | ORAL | Status: DC | PRN

## 2018-04-08 MED ORDER — TRAMADOL HCL 50 MG PO TABS: 50.0000 mg | ORAL_TABLET | Freq: Four times a day (QID) | ORAL | Status: DC | PRN

## 2018-04-08 NOTE — ED Triage Notes (Signed)
Pt arrived via EMS from home c/o N/V/D x 1.5 days, abdominal/chest pain, and dizziness upon standing.

## 2018-04-08 NOTE — ED Notes (Signed)
Patient transported to X-ray 

## 2018-04-08 NOTE — ED Provider Notes (Signed)
[facility] Emergency Department Provider Note  ____________________________________________  Time seen: Approximately 4:12 PM  I have reviewed the triage vital signs and the nursing notes.   HISTORY  Chief Complaint Abdominal Pain    HPI Brandon [name] is a 62 y.o. male with a history of CAD, hypertension, seizures, schizophrenia who comes the ED complaining of generalized abdominal pain nausea vomiting and diarrhea for the past 2 days.  Decreased appetite, feels fatigued.  No dizziness or syncope.  No chest pain shortness of breath fevers chills sweats or back pain.  Pain is nonradiating, no aggravating or alleviating factors.      Past Medical History:  Diagnosis Date  . Anemia    IDA  . Colon polyps   . COPD (chronic obstructive pulmonary disease) (HCC)   . Coronary artery disease   . Heart attack (HCC)   . Hiatal hernia   . History of ETOH abuse   . Hyperlipidemia   . Hypertension   . Pleurisy   . Schizophrenia (HCC)   . Schizophrenia (HCC)   . Seizures (HCC)   . Suicide attempt [facility])      Patient Active Problem List   Diagnosis Date Noted  . Atherosclerosis of native arteries of extremity with intermittent claudication (HCC) 11/22/2017  . Essential hypertension 11/22/2017  . Hyperlipidemia 11/22/2017  . COPD (chronic obstructive pulmonary disease) (HCC) 11/22/2017  . CAD (coronary artery disease) 10/15/2017     Past Surgical History:  Procedure Laterality Date  . ABDOMINAL SURGERY     internal bleeding  . COLONOSCOPY    . COLONOSCOPY WITH PROPOFOL N/A 05/09/2016   Procedure: COLONOSCOPY WITH PROPOFOL;  Surgeon: Scot Jun, MD;  Location: [facility] ENDOSCOPY;  Service: Endoscopy;  Laterality: N/A;  . CORONARY ANGIOPLASTY WITH STENT PLACEMENT    . ESOPHAGOGASTRODUODENOSCOPY (EGD) WITH PROPOFOL N/A 05/09/2016   Procedure: ESOPHAGOGASTRODUODENOSCOPY (EGD) WITH PROPOFOL;  Surgeon: Scot Jun, MD;  Location: [facility] ENDOSCOPY;   Service: Endoscopy;  Laterality: N/A;  . LEFT HEART CATH Right 10/19/2017   Procedure: Left Heart Cath and Coronary Angiography;  Surgeon: Laurier Nancy, MD;  Location: [facility] INVASIVE CV LAB;  Service: Cardiovascular;  Laterality: Right;  . NOSE SURGERY    . TOE AMPUTATION    . VASECTOMY       Prior to Admission medications   Medication Sig Start Date End Date Taking? Authorizing Provider  acetaminophen (TYLENOL) 500 MG tablet Take 1,000 mg by mouth every 6 (six) hours as needed for moderate pain or headache.    [provider]  aluminum-magnesium hydroxide-simethicone (MAALOX) 200-200-20 MG/5ML SUSP Take 30 mLs by mouth 4 (four) times daily -  before meals and at bedtime. 04/08/18   Sharman Cheek, MD  amLODipine (NORVASC) 5 MG tablet Take 5 mg by mouth daily.    [provider]  aspirin EC 81 MG tablet Take 324 mg by mouth 2 (two) times daily.    [provider]  buPROPion (WELLBUTRIN SR) 100 MG 12 hr tablet Take 100 mg by mouth daily.     [provider]  carvedilol (COREG) 25 MG tablet Take 25 mg by mouth 2 (two) times daily with a meal.    [provider]  cloZAPine (CLOZARIL) 100 MG tablet Take 150 mg by mouth at bedtime.     [provider]  diltiazem (CARDIZEM CD) 120 MG 24 hr capsule Take 120 mg by mouth daily. 09/04/17   [provider]  ezetimibe (ZETIA) 10 MG  tablet Take 10 mg by mouth at bedtime.     [provider]  famotidine (PEPCID) 20 MG tablet Take 20 mg by mouth 2 (two) times daily.    [provider]  famotidine (PEPCID) 20 MG tablet Take 1 tablet (20 mg total) by mouth 2 (two) times daily. 04/08/18   Sharman Cheek, MD  fenofibrate 160 MG tablet Take 160 mg by mouth daily.    [provider]  isosorbide mononitrate (IMDUR) 30 MG 24 hr tablet Take 30 mg by mouth 2 (two) times daily.     [provider]  latanoprost (XALATAN) 0.005 % ophthalmic solution Place 1 drop into  both eyes at bedtime. 09/15/17   [provider]  magnesium hydroxide (MILK OF MAGNESIA) 400 MG/5ML suspension Take 5 mLs by mouth daily.    [provider]  ondansetron (ZOFRAN ODT) 4 MG disintegrating tablet Take 1 tablet (4 mg total) by mouth every 8 (eight) hours as needed for nausea or vomiting. 04/08/18   Sharman Cheek, MD  senna (SENOKOT) 8.6 MG TABS tablet Take 1 tablet by mouth 2 (two) times daily.    [provider]  simvastatin (ZOCOR) 40 MG tablet Take 40 mg by mouth at bedtime.     [provider]  SYMBICORT 80-4.5 MCG/ACT inhaler Inhale 2 puffs into the lungs 2 (two) times daily. 09/15/17   [provider]  tamsulosin (FLOMAX) 0.4 MG CAPS capsule Take 0.4 mg by mouth daily.     [provider]  timolol (TIMOPTIC) 0.5 % ophthalmic solution Place 1 drop into both eyes daily. 09/15/17   [provider]     Allergies Alcohol-sulfur [sulfur]; Benadryl [diphenhydramine]; Compazine [prochlorperazine]; Depakote [divalproex sodium]; and Dramamine [dimenhydrinate]   Family History  Problem Relation Age of Onset  . Prostate cancer Neg Hx   . Bladder Cancer Neg Hx   . Kidney cancer Neg Hx     Social History Social History   Tobacco Use  . Smoking status: Former Smoker    Packs/day: 1.00    Last attempt to quit: 2012    Years since quitting: 7.6  . Smokeless tobacco: Former Engineer, water Use Topics  . Alcohol use: No  . Drug use: No    Review of Systems  Constitutional:   No fever or chills.  ENT:   No sore throat. No rhinorrhea. Cardiovascular:   No chest pain or syncope. Respiratory:   No dyspnea or cough. Gastrointestinal:   Positive as above for abdominal pain, vomiting and diarrhea.  Musculoskeletal:   Negative for focal pain or swelling All other systems reviewed and are negative except as documented above in ROS and HPI.  ____________________________________________   PHYSICAL EXAM:  VITAL  SIGNS: ED Triage Vitals  Enc Vitals Group     BP 04/08/18 1205 122/81     Pulse Rate 04/08/18 1205 91     Resp 04/08/18 1205 20     Temp 04/08/18 1205 97.9 F (36.6 C)     Temp Source 04/08/18 1205 Oral     SpO2 04/08/18 1202 96 %     Weight 04/08/18 1207 205 lb (93 kg)     Height 04/08/18 1207 6' (1.829 m)     Head Circumference --      Peak Flow --      Pain Score 04/08/18 1206 10     Pain Loc --      Pain Edu? --      Excl. in GC? --  Vital signs reviewed, nursing assessments reviewed.   Constitutional:   Alert and oriented. Non-toxic appearance. Eyes:   Conjunctivae are normal. EOMI. PERRL. ENT      Head:   Normocephalic and atraumatic.      Nose:   No congestion/rhinnorhea.       Mouth/Throat:   MMM, no pharyngeal erythema. No peritonsillar mass.       Neck:   No meningismus. Full ROM. Hematological/Lymphatic/Immunilogical:   No cervical lymphadenopathy. Cardiovascular:   RRR. Symmetric bilateral radial and DP pulses.  No murmurs. Cap refill less than 2 seconds. Respiratory:   Normal respiratory effort without tachypnea/retractions. Breath sounds are clear and equal bilaterally. No wheezes/rales/rhonchi. Gastrointestinal:   Soft with mild generalized tenderness, nonfocal. Non distended. There is no CVA tenderness.  No rebound, rigidity, or guarding.  Musculoskeletal:   Normal range of motion in all extremities. No joint effusions.  No lower extremity tenderness.  No edema. Neurologic:   Normal speech and language.  Motor grossly intact. No acute focal neurologic deficits are appreciated.  Skin:    Skin is warm, dry and intact. No rash noted.  No petechiae, purpura, or bullae.  ____________________________________________    LABS (pertinent positives/negatives) (all labs ordered are listed, but only abnormal results are displayed) Labs Reviewed  COMPREHENSIVE METABOLIC PANEL - Abnormal; Notable for the following components:      Result Value   Sodium 132 (*)     Chloride 95 (*)    Glucose, Bld 106 (*)    BUN 7 (*)    Creatinine, Ser 1.28 (*)    GFR calc non Af Amer 58 (*)    All other components within normal limits  CBC WITH DIFFERENTIAL/PLATELET - Abnormal; Notable for the following components:   WBC 11.0 (*)    RBC 4.13 (*)    Hemoglobin 10.9 (*)    HCT 33.1 (*)    RDW 15.1 (*)    Platelets 471 (*)    Neutro Abs 8.7 (*)    Monocytes Absolute 1.2 (*)    All other components within normal limits  LIPASE, BLOOD   ____________________________________________   EKG  Interpreted by me Sinus rhythm rate of 91, normal axis intervals QRS ST segments and T waves.  ____________________________________________    RADIOLOGY  Dg Abdomen Acute W/chest  Result Date: 04/08/2018 CLINICAL DATA:  Acute generalized abdominal pain. EXAM: DG ABDOMEN ACUTE W/ 1V CHEST COMPARISON:  Radiographs of June 04, 2017. FINDINGS: There is no evidence of dilated bowel loops or free intraperitoneal air. No radiopaque calculi or other significant radiographic abnormality is seen. Heart size and mediastinal contours are within normal limits. Both lungs are clear. IMPRESSION: No evidence of bowel obstruction or ileus. No acute cardiopulmonary disease. Electronically Signed   By: Lupita RaiderJames  Green Jr, M.D.   On: 04/08/2018 14:01    ____________________________________________   PROCEDURES Procedures  ____________________________________________    CLINICAL IMPRESSION / ASSESSMENT AND PLAN / ED COURSE  Pertinent labs & imaging results that were available during my care of the patient were reviewed by me and considered in my medical decision making (see chart for details).    Patient presents with generalized abdominal pain nausea vomiting diarrhea, likely viral syndrome.  Treat symptomatically, IV fluids, antiemetics, check labs x-ray and reassess.  Clinical Course as of Apr 09 1611  Thu Apr 08, 2018  1455 Labs and xr unremarkable. Suspect sx due to viral  syndrome / gastro.  No pain out of proportion, doubt mesenteric ischemia AAA  or dissection.  No evidence of appendicitis or significant biliary disease at this time.   [PS]    Clinical Course User Index [PS] Sharman Cheek, MD     ----------------------------------------- 4:13 PM on 04/08/2018 -----------------------------------------  On reassessment patient now has focal right upper quadrant tenderness.  Has not tried p.o. trial yet but he does feel better overall.  With no focal right upper quadrant pain and tenderness, I will obtain an ultrasound to evaluate for impacted gallstone.  If reassuring I think he can be discharged home and the patient is agreeable.  ____________________________________________   FINAL CLINICAL IMPRESSION(S) / ED DIAGNOSES    Final diagnoses:  Generalized abdominal pain  Non-intractable vomiting with nausea, unspecified vomiting type     ED Discharge Orders         Ordered    famotidine (PEPCID) 20 MG tablet  2 times daily     04/08/18 1612    ondansetron (ZOFRAN ODT) 4 MG disintegrating tablet  Every 8 hours PRN     04/08/18 1612    aluminum-magnesium hydroxide-simethicone (MAALOX) 200-200-20 MG/5ML SUSP  3 times daily before meals & bedtime     04/08/18 1612          Portions of this note were generated with dragon dictation software. Dictation errors may occur despite best attempts at proofreading.    Sharman Cheek, MD 04/08/18 [phone]

## 2018-04-08 NOTE — ED Provider Notes (Addendum)
Patient was signed out to me by Dr. Alfonse FlavorsPhilip Stafford.  62 year old male who has been having nausea and vomiting since Saturday night, with right upper quadrant pain.  His LFTs are within normal limits but he does have gallbladder wall thickening and a white blood cell count of 11.0.  The patient is hemodynamically stable and has not been having fevers.  At this time, his pain in his nausea are well controlled.  I have let him know the results and will call the general surgeon for evaluation.  ----------------------------------------- 5:38 PM on 04/08/2018 -----------------------------------------  I have spoken with Dr. Tonna BoehringerSakai, who will come see the patient for further evaluation.   Rockne MenghiniNorman, Anne-Caroline, MD 04/08/18 1735    Rockne MenghiniNorman, Anne-Caroline, MD 04/08/18 [phone]

## 2018-04-08 NOTE — ED Notes (Signed)
Patient transported to Ultrasound 

## 2018-04-08 NOTE — H&P (Signed)
Subjective:   CC: abdominal pain/nausea  HPI:  Brandon [name] is a 62 y.o. male who is consulted by Etheleen NicksAnn-Caroline Norman for evaluation of above cc.  Symptoms were first noted one month ago. Pain is RUQ, epigastric, waxes and wanes, non-radiating.  Associated with nausea, exacerbated by nothing specific.  Alleviated by nothing specific, has tried tylenol and motrin.   Vomiting started 5 days ago, intermittent, non-bloody.  Past Medical History:  has a past medical history of Anemia, Colon polyps, COPD (chronic obstructive pulmonary disease) (HCC), Coronary artery disease, Heart attack (HCC), Hiatal hernia, History of ETOH abuse, Hyperlipidemia, Hypertension, Pleurisy, Schizophrenia (HCC), Schizophrenia (HCC), Seizures (HCC), and Suicide attempt (HCC).  Past Surgical History:  has a past surgical history that includes Coronary angioplasty with stent; Abdominal surgery; Toe amputation; Colonoscopy; Nose surgery; Vasectomy; Colonoscopy with propofol (N/A, 05/09/2016); Esophagogastroduodenoscopy (egd) with propofol (N/A, 05/09/2016); and Left Heart Cath (Right, 10/19/2017).  Family History: non-contributory  Social History:  reports that he quit smoking about 7 years ago. He smoked 1.00 pack per day. He has quit using smokeless tobacco. He reports that he does not drink alcohol or use drugs.  Current Medications: MAR reviewed by me  Allergies:  Allergies as of 04/08/2018 - Review Complete 04/08/2018  Allergen Reaction Noted  . Alcohol-sulfur [sulfur] Other (See Comments) 10/16/2017  . Benadryl [diphenhydramine] Other (See Comments) 04/19/2015  . Compazine [prochlorperazine] Other (See Comments) 04/19/2015  . Depakote [divalproex sodium] Other (See Comments) 05/08/2016  . Dramamine [dimenhydrinate] Swelling 04/19/2015    ROS:  A 15 point review of systems was performed and pertinent positives and negatives noted in HPI    Objective:     BP 114/73   Pulse 91   Temp 97.9 F (36.6 C) (Oral)    Resp 20   Ht 6' (1.829 m)   Wt 93 kg   SpO2 97%   BMI 27.80 kg/m    Constitutional :  alert, cooperative, appears stated age and no distress  Lymphatics/Throat:  no asymmetry, masses, or scars  Respiratory:  clear to auscultation bilaterally  Cardiovascular:  regular rate and rhythm  Gastrointestinal: soft, no guarding, but focal tenderness noted in epigastric and RUQ.   Musculoskeletal: Steady gait and movement  Skin: Cool and moist  Psychiatric: Normal affect, non-agitated, not confused       LABS:  CMP Latest Ref Rng & Units 04/08/2018 09/16/2017 06/04/2017  Glucose 70 - 99 mg/dL 409(W106(H) 119(J116(H) 478(G116(H)  BUN 8 - 23 mg/dL 7(L) 13 14  Creatinine 0.61 - 1.24 mg/dL 9.56(O1.28(H) 1.30(Q1.38(H) 6.571.14  Sodium 135 - 145 mmol/L 132(L) 132(L) 133(L)  Potassium 3.5 - 5.1 mmol/L 4.1 3.9 4.1  Chloride 98 - 111 mmol/L 95(L) 99(L) 99(L)  CO2 22 - 32 mmol/L 25 23 25   Calcium 8.9 - 10.3 mg/dL 9.4 9.3 9.5  Total Protein 6.5 - 8.1 g/dL 7.0 - -  Total Bilirubin 0.3 - 1.2 mg/dL 0.6 - -  Alkaline Phos 38 - 126 U/L 63 - -  AST 15 - 41 U/L 27 - -  ALT 0 - 44 U/L 13 - -   CBC Latest Ref Rng & Units 04/08/2018 09/16/2017 06/04/2017  WBC 3.8 - 10.6 K/uL 11.0(H) 7.6 10.0  Hemoglobin 13.0 - 18.0 g/dL 10.9(L) 12.4(L) 12.4(L)  Hematocrit 40.0 - 52.0 % 33.1(L) 37.5(L) 38.1(L)  Platelets 150 - 440 K/uL 471(H) 327 351     RADS: CLINICAL DATA:  RIGHT upper quadrant pain for a month.  Vomiting.  EXAM: ULTRASOUND ABDOMEN LIMITED  RIGHT UPPER QUADRANT  COMPARISON:  None.  FINDINGS: Gallbladder:  16 mm gallbladder wall thickening and pericholecystic fluid and perihepatic free fluid. Contracted gallbladder. No echogenic cholelithiasis or sonographic Murphy sign elicited.  Common bile duct:  Diameter: 3 mm  Liver:  No focal lesion identified. Within normal limits in parenchymal echogenicity. Portal vein is patent on color Doppler imaging with normal direction of blood flow towards the  liver.  Exophytic 2.6 cm anechoic cyst RIGHT kidney.  IMPRESSION: 1. Severe edematous gallbladder wall thickening. No cholelithiasis. Findings seen with chronic versus acalculous cholecystitis (though unlikely without sonographic Murphy sign) or CHF, cirrhosis/portal hypertension or cholangiopathy.   Electronically Signed   By: Awilda Metro M.D. Assessment:      RUQ pain, dehydration, leukocytosis, acute on chronic cholecystitis.  HTN, CAD, HLD, depression, anxiety  Plan:     Due to the 5 day history of acute exacerbation of symptoms reported by patient, severity noted on [place], and his extensive cardiac history, I recommended we try a trial of antibiotics since attempted surgery at this point will carry a higher risk due to the severely inflamed tissue.  Eventual plan is to do a interval lap chole after resolution of this acute episode.   Due to his elevated creatinine likely from dehydration from severe nausea and episodes of vomiting we will admit him for IV fluid resuscitation as well.  patient feels more comfortable with this plan rather than outpatient p.o. antibiotic therapy.  Will continue home meds for HTN, CAD, HLD, depression, and anxiety

## 2018-04-09 LAB — MAGNESIUM: Magnesium: 2 mg/dL (ref 1.7–2.4)

## 2018-04-09 LAB — CBC
HEMATOCRIT: 29.8 % — AB (ref 40.0–52.0)
Hemoglobin: 10.1 g/dL — ABNORMAL LOW (ref 13.0–18.0)
MCH: 27.1 pg (ref 26.0–34.0)
MCHC: 33.8 g/dL (ref 32.0–36.0)
MCV: 80.3 fL (ref 80.0–100.0)
Platelets: 422 10*3/uL (ref 150–440)
RBC: 3.72 MIL/uL — ABNORMAL LOW (ref 4.40–5.90)
RDW: 15.4 % — AB (ref 11.5–14.5)
WBC: 9.3 10*3/uL (ref 3.8–10.6)

## 2018-04-09 LAB — HEPATIC FUNCTION PANEL
ALT: 12 U/L (ref 0–44)
AST: 18 U/L (ref 15–41)
Albumin: 3.1 g/dL — ABNORMAL LOW (ref 3.5–5.0)
Alkaline Phosphatase: 53 U/L (ref 38–126)
BILIRUBIN DIRECT: 0.1 mg/dL (ref 0.0–0.2)
BILIRUBIN INDIRECT: 0.4 mg/dL (ref 0.3–0.9)
BILIRUBIN TOTAL: 0.5 mg/dL (ref 0.3–1.2)
Total Protein: 6.3 g/dL — ABNORMAL LOW (ref 6.5–8.1)

## 2018-04-09 LAB — BASIC METABOLIC PANEL
ANION GAP: 10 (ref 5–15)
BUN: 9 mg/dL (ref 8–23)
CALCIUM: 8.8 mg/dL — AB (ref 8.9–10.3)
CO2: 24 mmol/L (ref 22–32)
CREATININE: 1.04 mg/dL (ref 0.61–1.24)
Chloride: 104 mmol/L (ref 98–111)
GFR calc Af Amer: >60 mL/min (ref >60)
GFR calc non Af Amer: >60 mL/min (ref >60)
Glucose, Bld: 109 mg/dL — ABNORMAL HIGH (ref 70–99)
Potassium: 4.1 mmol/L (ref 3.5–5.1)
Sodium: 138 mmol/L (ref 135–145)

## 2018-04-09 LAB — PHOSPHORUS: PHOSPHORUS: 3.3 mg/dL (ref 2.5–4.6)

## 2018-04-09 MED ORDER — CLOZAPINE 100 MG PO TABS
100.0000 mg | ORAL_TABLET | Freq: Once | ORAL | Status: AC
  Administered 2018-04-09: 100 mg via ORAL
  Filled 2018-04-09: qty 1

## 2018-04-09 MED ORDER — CLOZAPINE 100 MG PO TABS
200.0000 mg | ORAL_TABLET | Freq: Every day | ORAL | Status: DC
  Administered 2018-04-10: 200 mg via ORAL
  Filled 2018-04-09 (×3): qty 2

## 2018-04-09 NOTE — Care Management (Signed)
MOON letter explained to patient and questions answered. Patient independent in room and up to bathroom during CM first presentation. On return to deliver MOON, per RN, patient being treated with antibiotics now and hopefully discharge to home tomorrow.

## 2018-04-09 NOTE — Progress Notes (Signed)
Subjective:  CC:  Brandon [name] is a 62 y.o. male  Hospital stay day 0,   acute on chronic cholecystitis  HPI: No acute issues overnight. States pain and nausea improved  ROS:  A 5 point review of systems was performed and pertinent positives and negatives noted in HPI.   Objective:      Temp:  [97.9 F (36.6 C)-99.3 F (37.4 C)] 98.2 F (36.8 C) (08/30 0906) Pulse Rate:  [76-98] 82 (08/30 0906) Resp:  [17-20] 17 (08/30 0501) BP: (104-125)/(73-84) 111/76 (08/30 0906) SpO2:  [96 %-98 %] 96 % (08/30 0501) Weight:  [87.8 kg-93 kg] 87.8 kg (08/29 1952)     Height: 6' (182.9 cm) Weight: 87.8 kg BMI (Calculated): 26.25   Intake/Output this shift:  Total I/O In: -  Out: 250 [Urine:250]       Constitutional :  alert, cooperative, appears stated age and no distress  Respiratory:  clear to auscultation bilaterally  Cardiovascular:  regular rate and rhythm  Gastrointestinal: soft, but still obvious focal guarding at RUQ, non-radiaiting.  no tenderness anywhere else.   Skin: Cool and moist.   Psychiatric: Normal affect, non-agitated, not confused       LABS:  CMP Latest Ref Rng & Units 04/09/2018 04/08/2018 09/16/2017  Glucose 70 - 99 mg/dL 161(W109(H) 960(A106(H) 540(J116(H)  BUN 8 - 23 mg/dL 9 7(L) 13  Creatinine 8.110.61 - 1.24 mg/dL 9.141.04 7.82(N1.28(H) 5.62(Z1.38(H)  Sodium 135 - 145 mmol/L 138 132(L) 132(L)  Potassium 3.5 - 5.1 mmol/L 4.1 4.1 3.9  Chloride 98 - 111 mmol/L 104 95(L) 99(L)  CO2 22 - 32 mmol/L 24 25 23   Calcium 8.9 - 10.3 mg/dL 3.0(Q8.8(L) 9.4 9.3  Total Protein 6.5 - 8.1 g/dL 6.3(L) 7.0 -  Total Bilirubin 0.3 - 1.2 mg/dL 0.5 0.6 -  Alkaline Phos 38 - 126 U/L 53 63 -  AST 15 - 41 U/L 18 27 -  ALT 0 - 44 U/L 12 13 -   CBC Latest Ref Rng & Units 04/09/2018 04/08/2018 09/16/2017  WBC 3.8 - 10.6 K/uL 9.3 11.0(H) 7.6  Hemoglobin 13.0 - 18.0 g/dL 10.1(L) 10.9(L) 12.4(L)  Hematocrit 40.0 - 52.0 % 29.8(L) 33.1(L) 37.5(L)  Platelets 150 - 440 K/uL 422 471(H) 327    RADS:  Assessment:   Acute  on chronic cholecystitis.  Still tender in RUQ, will continue IV abx for another day and see if any improvement in pain.  Ok to try CLD in the meantime.  Still hope to discharge on outpt abx and do interval lap chole.  Of note, patient home med regimen consists of multiple antihypertensives, including two beta blockers.  This was listed on home package service which has all his medications packed into daily doses as prescribed.  I also confirmed today per patient report that his cardiology office is the only ones prescribing his antiHTN and last appointment was three weeks ago.  Still remains asymptomatic, no issues of hypotension at overnight, so will continue with current home regimen.  I also initially discontinued his clozapine due to patient refusal and documented allergy, but patient states he will like to resume it now.  Since he was tolerating it at home, will resume today.

## 2018-04-09 NOTE — Care Management Obs Status (Signed)
MEDICARE OBSERVATION STATUS NOTIFICATION   Patient Details  Name: Brandon RheinRobert S Lepp MRN: [id number] Date of Birth: 22-Apr-1956   Medicare Observation Status Notification Given:  Yes    Sherren KernsJennifer L Payton Moder, RN 04/09/2018, 12:00 PM

## 2018-04-09 NOTE — Progress Notes (Signed)
MEDICATION RELATED NOTE   Pharmacy Note for clozapine REMs reporting and lab monitoring  Allergies  Allergen Reactions  . Alcohol-Sulfur [Sulfur] Other (See Comments)    Unknown  . Benadryl [Diphenhydramine] Other (See Comments)    "stuffy"  . Compazine [Prochlorperazine] Other (See Comments)    Dystonic rxn   . Depakote [Divalproex Sodium] Other (See Comments)    Unknown  . Dramamine [Dimenhydrinate] Swelling    Patient Measurements: Height: 6' (182.9 cm) Weight: 193 lb 9.6 oz (87.8 kg) IBW/kg (Calculated) : 77.6  Vital Signs: Temp: 98.6 F (37 C) (08/30 1238) Temp Source: Oral (08/30 1238) BP: 115/78 (08/30 1238) Pulse Rate: 73 (08/30 1238) Intake/Output from previous day: 08/29 0701 - 08/30 0700 In: 2503.3 [I.V.:1403.3; IV Piggyback:1100] Out: 350 [Urine:350] Intake/Output from this shift: Total I/O In: -  Out: 500 [Urine:500]  Labs: Recent Labs    04/08/18 1206 04/09/18 0614  WBC 11.0* 9.3  HGB 10.9* 10.1*  HCT 33.1* 29.8*  PLT 471* 422  CREATININE 1.28* 1.04  MG  --  2.0  PHOS  --  3.3  ALBUMIN 3.7 3.1*  PROT 7.0 6.3*  AST 27 18  ALT 13 12  ALKPHOS 63 53  BILITOT 0.6 0.5  BILIDIR  --  0.1  IBILI  --  0.4   Estimated Creatinine Clearance: 80.8 mL/min (by C-G formula based on SCr of 1.04 mg/dL).   Microbiology: No results found for this or any previous visit (from the past 720 hour(s)).  Medical History: Past Medical History:  Diagnosis Date  . Anemia    IDA  . Colon polyps   . COPD (chronic obstructive pulmonary disease) (HCC)   . Coronary artery disease   . Heart attack (HCC)   . Hiatal hernia   . History of ETOH abuse   . Hyperlipidemia   . Hypertension   . Pleurisy   . Schizophrenia (HCC)   . Schizophrenia (HCC)   . Seizures (HCC)   . Suicide attempt [facility](HCC)     Assessment: 62 year old male on clozapine 200 mg qhs PTA   Plan:  8/29 ANC 8700. Lab was reported to REMs program. Patient eligible to receive clozapine with  weekly lab monitoring. Will order CBC/diff for 09/05 with morning labs  Pricilla RiffleAbby K Tamakia Porto, PharmD Pharmacy Resident  04/09/2018 4:03 PM

## 2018-04-10 DIAGNOSIS — K812 Acute cholecystitis with chronic cholecystitis: Secondary | ICD-10-CM | POA: Diagnosis not present

## 2018-04-10 DIAGNOSIS — Z87891 Personal history of nicotine dependence: Secondary | ICD-10-CM | POA: Diagnosis not present

## 2018-04-10 DIAGNOSIS — J449 Chronic obstructive pulmonary disease, unspecified: Secondary | ICD-10-CM | POA: Diagnosis not present

## 2018-04-10 DIAGNOSIS — I252 Old myocardial infarction: Secondary | ICD-10-CM | POA: Diagnosis not present

## 2018-04-10 DIAGNOSIS — F209 Schizophrenia, unspecified: Secondary | ICD-10-CM | POA: Diagnosis not present

## 2018-04-10 DIAGNOSIS — Z955 Presence of coronary angioplasty implant and graft: Secondary | ICD-10-CM | POA: Diagnosis not present

## 2018-04-10 DIAGNOSIS — E785 Hyperlipidemia, unspecified: Secondary | ICD-10-CM | POA: Diagnosis not present

## 2018-04-10 DIAGNOSIS — Z7982 Long term (current) use of aspirin: Secondary | ICD-10-CM | POA: Diagnosis not present

## 2018-04-10 DIAGNOSIS — F329 Major depressive disorder, single episode, unspecified: Secondary | ICD-10-CM | POA: Diagnosis not present

## 2018-04-10 DIAGNOSIS — I10 Essential (primary) hypertension: Secondary | ICD-10-CM | POA: Diagnosis not present

## 2018-04-10 DIAGNOSIS — I2510 Atherosclerotic heart disease of native coronary artery without angina pectoris: Secondary | ICD-10-CM | POA: Diagnosis not present

## 2018-04-10 DIAGNOSIS — Z7951 Long term (current) use of inhaled steroids: Secondary | ICD-10-CM | POA: Diagnosis not present

## 2018-04-10 DIAGNOSIS — Z79899 Other long term (current) drug therapy: Secondary | ICD-10-CM | POA: Diagnosis not present

## 2018-04-10 DIAGNOSIS — F419 Anxiety disorder, unspecified: Secondary | ICD-10-CM | POA: Diagnosis not present

## 2018-04-10 DIAGNOSIS — K810 Acute cholecystitis: Secondary | ICD-10-CM | POA: Diagnosis present

## 2018-04-10 DIAGNOSIS — R1084 Generalized abdominal pain: Secondary | ICD-10-CM | POA: Diagnosis present

## 2018-04-10 DIAGNOSIS — E860 Dehydration: Secondary | ICD-10-CM | POA: Diagnosis not present

## 2018-04-10 LAB — BASIC METABOLIC PANEL
Anion gap: 6 (ref 5–15)
BUN: 8 mg/dL (ref 8–23)
CHLORIDE: 100 mmol/L (ref 98–111)
CO2: 26 mmol/L (ref 22–32)
Calcium: 8.5 mg/dL — ABNORMAL LOW (ref 8.9–10.3)
Creatinine, Ser: 0.9 mg/dL (ref 0.61–1.24)
GFR calc Af Amer: >60 mL/min (ref >60)
GFR calc non Af Amer: >60 mL/min (ref >60)
Glucose, Bld: 97 mg/dL (ref 70–99)
POTASSIUM: 3.8 mmol/L (ref 3.5–5.1)
SODIUM: 132 mmol/L — AB (ref 135–145)

## 2018-04-10 LAB — CBC
HCT: 27.8 % — ABNORMAL LOW (ref 40.0–52.0)
HEMOGLOBIN: 9.3 g/dL — AB (ref 13.0–18.0)
MCH: 27.1 pg (ref 26.0–34.0)
MCHC: 33.6 g/dL (ref 32.0–36.0)
MCV: 80.7 fL (ref 80.0–100.0)
Platelets: 364 10*3/uL (ref 150–440)
RBC: 3.44 MIL/uL — ABNORMAL LOW (ref 4.40–5.90)
RDW: 14.9 % — ABNORMAL HIGH (ref 11.5–14.5)
WBC: 9.4 10*3/uL (ref 3.8–10.6)

## 2018-04-10 LAB — MAGNESIUM: MAGNESIUM: 1.7 mg/dL (ref 1.7–2.4)

## 2018-04-10 LAB — HEPATIC FUNCTION PANEL
ALBUMIN: 2.8 g/dL — AB (ref 3.5–5.0)
ALK PHOS: 48 U/L (ref 38–126)
ALT: 12 U/L (ref 0–44)
AST: 16 U/L (ref 15–41)
TOTAL PROTEIN: 5.7 g/dL — AB (ref 6.5–8.1)
Total Bilirubin: 0.5 mg/dL (ref 0.3–1.2)

## 2018-04-10 LAB — HIV ANTIBODY (ROUTINE TESTING W REFLEX): HIV Screen 4th Generation wRfx: NONREACTIVE

## 2018-04-10 LAB — PHOSPHORUS: PHOSPHORUS: 3.2 mg/dL (ref 2.5–4.6)

## 2018-04-10 NOTE — Progress Notes (Signed)
SURGICAL PROGRESS NOTE   Hospital Day(s): 0.   Post op day(s):  Marland Kitchen.   Interval History: Patient seen and examined, no acute events or new complaints overnight. Patient reports still having pain episodes but improved. He refers he had broth yesterday without worsening of the pain. Denies nausea or vomiting.  Vital signs in last 24 hours: [min-max] current  Temp:  [97.8 F (36.6 C)-98.6 F (37 C)] 97.8 F (36.6 C) (08/31 0456) Pulse Rate:  [71-73] 71 (08/31 0456) Resp:  [18-20] 20 (08/31 0456) BP: (109-135)/(69-79) 109/69 (08/31 0456) SpO2:  [97 %-99 %] 97 % (08/31 0456)     Height: 6' (182.9 cm) Weight: 87.8 kg BMI (Calculated): 26.25   Physical Exam:  Constitutional: alert, cooperative and no distress  Respiratory: breathing non-labored at rest  Cardiovascular: regular rate and sinus rhythm  Gastrointestinal: soft, non-tender, and non-distended  Labs:  CBC Latest Ref Rng & Units 04/10/2018 04/09/2018 04/08/2018  WBC 3.8 - 10.6 K/uL 9.4 9.3 11.0(H)  Hemoglobin 13.0 - 18.0 g/dL 2.4(M9.3(L) 10.1(L) 10.9(L)  Hematocrit 40.0 - 52.0 % 27.8(L) 29.8(L) 33.1(L)  Platelets 150 - 440 K/uL 364 422 471(H)   CMP Latest Ref Rng & Units 04/10/2018 04/09/2018 04/08/2018  Glucose 70 - 99 mg/dL 97 010(U109(H) 725(D106(H)  BUN 8 - 23 mg/dL 8 9 7(L)  Creatinine 6.640.61 - 1.24 mg/dL 4.030.90 4.741.04 2.59(D1.28(H)  Sodium 135 - 145 mmol/L 132(L) 138 132(L)  Potassium 3.5 - 5.1 mmol/L 3.8 4.1 4.1  Chloride 98 - 111 mmol/L 100 104 95(L)  CO2 22 - 32 mmol/L 26 24 25   Calcium 8.9 - 10.3 mg/dL 6.3(O8.5(L) 7.5(I8.8(L) 9.4  Total Protein 6.5 - 8.1 g/dL 4.3(P5.7(L) 6.3(L) 7.0  Total Bilirubin 0.3 - 1.2 mg/dL 0.5 0.5 0.6  Alkaline Phos 38 - 126 U/L 48 53 63  AST 15 - 41 U/L 16 18 27   ALT 0 - 44 U/L 12 12 13     Imaging studies: No new pertinent imaging studies   Assessment/Plan:  62 y.o. male with acute over chronic cholecystitis.  Patient had clear liquid yesterday despite order today says NPO. Will progress to full liquids and assess for  tolerating. There is stable normal WBC count. No fever. Patient still with episodic abdominal pain that needs to continue to be monitored specially now that the diet is being progress. Patient needs to continue IV abx therapy until we are sure that patient will tolerate solid food to be able to make transition to oral antibiotic.   Gae GallopEdgardo Cintrn-Daz, MD

## 2018-04-11 DIAGNOSIS — I252 Old myocardial infarction: Secondary | ICD-10-CM | POA: Diagnosis not present

## 2018-04-11 DIAGNOSIS — Z7982 Long term (current) use of aspirin: Secondary | ICD-10-CM | POA: Diagnosis not present

## 2018-04-11 DIAGNOSIS — F329 Major depressive disorder, single episode, unspecified: Secondary | ICD-10-CM | POA: Diagnosis not present

## 2018-04-11 DIAGNOSIS — Z955 Presence of coronary angioplasty implant and graft: Secondary | ICD-10-CM | POA: Diagnosis not present

## 2018-04-11 DIAGNOSIS — F209 Schizophrenia, unspecified: Secondary | ICD-10-CM | POA: Diagnosis not present

## 2018-04-11 DIAGNOSIS — Z87891 Personal history of nicotine dependence: Secondary | ICD-10-CM | POA: Diagnosis not present

## 2018-04-11 DIAGNOSIS — E860 Dehydration: Secondary | ICD-10-CM | POA: Diagnosis not present

## 2018-04-11 DIAGNOSIS — Z79899 Other long term (current) drug therapy: Secondary | ICD-10-CM | POA: Diagnosis not present

## 2018-04-11 DIAGNOSIS — I10 Essential (primary) hypertension: Secondary | ICD-10-CM | POA: Diagnosis not present

## 2018-04-11 DIAGNOSIS — J449 Chronic obstructive pulmonary disease, unspecified: Secondary | ICD-10-CM | POA: Diagnosis not present

## 2018-04-11 DIAGNOSIS — F419 Anxiety disorder, unspecified: Secondary | ICD-10-CM | POA: Diagnosis not present

## 2018-04-11 DIAGNOSIS — I2510 Atherosclerotic heart disease of native coronary artery without angina pectoris: Secondary | ICD-10-CM | POA: Diagnosis not present

## 2018-04-11 DIAGNOSIS — Z7951 Long term (current) use of inhaled steroids: Secondary | ICD-10-CM | POA: Diagnosis not present

## 2018-04-11 DIAGNOSIS — E785 Hyperlipidemia, unspecified: Secondary | ICD-10-CM | POA: Diagnosis not present

## 2018-04-11 DIAGNOSIS — R1084 Generalized abdominal pain: Secondary | ICD-10-CM | POA: Diagnosis present

## 2018-04-11 DIAGNOSIS — K812 Acute cholecystitis with chronic cholecystitis: Secondary | ICD-10-CM | POA: Diagnosis not present

## 2018-04-11 LAB — BASIC METABOLIC PANEL
Anion gap: 9 (ref 5–15)
BUN: 6 mg/dL — ABNORMAL LOW (ref 8–23)
CALCIUM: 8.9 mg/dL (ref 8.9–10.3)
CO2: 28 mmol/L (ref 22–32)
CREATININE: 0.99 mg/dL (ref 0.61–1.24)
Chloride: 99 mmol/L (ref 98–111)
GFR calc non Af Amer: >60 mL/min (ref >60)
Glucose, Bld: 97 mg/dL (ref 70–99)
Potassium: 3.7 mmol/L (ref 3.5–5.1)
SODIUM: 136 mmol/L (ref 135–145)

## 2018-04-11 LAB — MAGNESIUM: Magnesium: 1.8 mg/dL (ref 1.7–2.4)

## 2018-04-11 LAB — CBC
HEMATOCRIT: 29.4 % — AB (ref 40.0–52.0)
Hemoglobin: 9.9 g/dL — ABNORMAL LOW (ref 13.0–18.0)
MCH: 26.9 pg (ref 26.0–34.0)
MCHC: 33.6 g/dL (ref 32.0–36.0)
MCV: 80 fL (ref 80.0–100.0)
Platelets: 405 10*3/uL (ref 150–440)
RBC: 3.67 MIL/uL — ABNORMAL LOW (ref 4.40–5.90)
RDW: 14.7 % — AB (ref 11.5–14.5)
WBC: 6.1 10*3/uL (ref 3.8–10.6)

## 2018-04-11 LAB — HEPATIC FUNCTION PANEL
ALBUMIN: 2.9 g/dL — AB (ref 3.5–5.0)
ALT: 13 U/L (ref 0–44)
AST: 17 U/L (ref 15–41)
Alkaline Phosphatase: 53 U/L (ref 38–126)
BILIRUBIN TOTAL: 0.4 mg/dL (ref 0.3–1.2)
Total Protein: 6 g/dL — ABNORMAL LOW (ref 6.5–8.1)

## 2018-04-11 LAB — PHOSPHORUS: PHOSPHORUS: 3.5 mg/dL (ref 2.5–4.6)

## 2018-04-11 MED ORDER — CLOZAPINE 100 MG PO TABS: 100.0000 mg | ORAL_TABLET | Freq: Every day | ORAL | Status: DC

## 2018-04-11 NOTE — Progress Notes (Signed)
SURGICAL PROGRESS NOTE   Hospital Day(s): 1.   Post op day(s):  Marland Kitchen   Interval History: Patient seen and examined, no acute events or new complaints overnight. Patient reports some improvement of the pain. Denies nausea or vomiting. Denies worsening of the pain with oral intake.  Vital signs in last 24 hours: [min-max] current  Temp:  [97.7 F (36.5 C)-98.6 F (37 C)] 97.7 F (36.5 C) (09/01 0403) Pulse Rate:  [65-73] 70 (09/01 0403) Resp:  [18] 18 (09/01 0403) BP: (113-136)/(70-80) 128/73 (09/01 0403) SpO2:  [95 %-98 %] 98 % (09/01 0403)     Height: 6' (182.9 cm) Weight: 87.8 kg BMI (Calculated): 26.25   Physical Exam:  Constitutional: alert, cooperative and no distress  Respiratory: breathing non-labored at rest  Cardiovascular: regular rate and sinus rhythm  Gastrointestinal: soft, non-tender, and non-distended  Labs:  CBC Latest Ref Rng & Units 04/11/2018 04/10/2018 04/09/2018  WBC 3.8 - 10.6 K/uL 6.1 9.4 9.3  Hemoglobin 13.0 - 18.0 g/dL 7.1(Q) 1.9(X) 10.1(L)  Hematocrit 40.0 - 52.0 % 29.4(L) 27.8(L) 29.8(L)  Platelets 150 - 440 K/uL 405 364 422   CMP Latest Ref Rng & Units 04/11/2018 04/10/2018 04/09/2018  Glucose 70 - 99 mg/dL 97 97 588(T)  BUN 8 - 23 mg/dL 6(L) 8 9  Creatinine 2.54 - 1.24 mg/dL 9.82 6.41 5.83  Sodium 135 - 145 mmol/L 136 132(L) 138  Potassium 3.5 - 5.1 mmol/L 3.7 3.8 4.1  Chloride 98 - 111 mmol/L 99 100 104  CO2 22 - 32 mmol/L 28 26 24   Calcium 8.9 - 10.3 mg/dL 8.9 0.9(M) 0.7(W)  Total Protein 6.5 - 8.1 g/dL 6.0(L) 5.7(L) 6.3(L)  Total Bilirubin 0.3 - 1.2 mg/dL 0.4 0.5 0.5  Alkaline Phos 38 - 126 U/L 53 48 53  AST 15 - 41 U/L 17 16 18   ALT 0 - 44 U/L 13 12 12     Imaging studies: No new pertinent imaging studies   Assessment/Plan:  62 y.o. male with acute over chronic cholecystitis.  Patient tolerated full liquid yesterday. Will progress to soft diet and assess for tolerating. There is stable normal WBC count. No fever. If pain is not worsened  with solid diet may be able to be discharged tomorrow. Will discontinue IVF's. Continue IV abx therapy.   Gae Gallop, MD

## 2018-04-12 DIAGNOSIS — R1084 Generalized abdominal pain: Secondary | ICD-10-CM | POA: Diagnosis not present

## 2018-04-12 DIAGNOSIS — K812 Acute cholecystitis with chronic cholecystitis: Secondary | ICD-10-CM | POA: Diagnosis not present

## 2018-04-12 LAB — HEPATIC FUNCTION PANEL
ALBUMIN: 3.2 g/dL — AB (ref 3.5–5.0)
ALT: 13 U/L (ref 0–44)
AST: 15 U/L (ref 15–41)
Alkaline Phosphatase: 51 U/L (ref 38–126)
Bilirubin, Direct: <0.1 mg/dL (ref 0.0–0.2)
TOTAL PROTEIN: 6.5 g/dL (ref 6.5–8.1)
Total Bilirubin: 0.3 mg/dL (ref 0.3–1.2)

## 2018-04-12 LAB — BASIC METABOLIC PANEL
Anion gap: 8 (ref 5–15)
BUN: 10 mg/dL (ref 8–23)
CHLORIDE: 100 mmol/L (ref 98–111)
CO2: 27 mmol/L (ref 22–32)
CREATININE: 0.87 mg/dL (ref 0.61–1.24)
Calcium: 9.1 mg/dL (ref 8.9–10.3)
GFR calc non Af Amer: >60 mL/min (ref >60)
Glucose, Bld: 98 mg/dL (ref 70–99)
POTASSIUM: 3.7 mmol/L (ref 3.5–5.1)
SODIUM: 135 mmol/L (ref 135–145)

## 2018-04-12 LAB — CBC
HCT: 29.3 % — ABNORMAL LOW (ref 40.0–52.0)
Hemoglobin: 10.4 g/dL — ABNORMAL LOW (ref 13.0–18.0)
MCH: 28.2 pg (ref 26.0–34.0)
MCHC: 35.4 g/dL (ref 32.0–36.0)
MCV: 79.8 fL — ABNORMAL LOW (ref 80.0–100.0)
PLATELETS: 429 10*3/uL (ref 150–440)
RBC: 3.67 MIL/uL — ABNORMAL LOW (ref 4.40–5.90)
RDW: 14.8 % — ABNORMAL HIGH (ref 11.5–14.5)
WBC: 6.6 10*3/uL (ref 3.8–10.6)

## 2018-04-12 LAB — MAGNESIUM: Magnesium: 1.8 mg/dL (ref 1.7–2.4)

## 2018-04-12 LAB — PHOSPHORUS: Phosphorus: 3.8 mg/dL (ref 2.5–4.6)

## 2018-04-12 MED ORDER — AMOXICILLIN-POT CLAVULANATE 875-125 MG PO TABS: 1.0000 | ORAL_TABLET | Freq: Two times a day (BID) | ORAL | 0 refills | Status: AC

## 2018-04-12 MED ORDER — HYDROCODONE-ACETAMINOPHEN 5-325 MG PO TABS: 1.0000 | ORAL_TABLET | ORAL | 0 refills | Status: DC | PRN

## 2018-04-12 MED ORDER — CLOZAPINE 100 MG PO TABS
100.0000 mg | ORAL_TABLET | Freq: Every day | ORAL | Status: DC
  Administered 2018-04-12: 100 mg via ORAL
  Filled 2018-04-12 (×2): qty 1

## 2018-04-12 MED ORDER — ONDANSETRON 4 MG PO TBDP: 4.0000 mg | ORAL_TABLET | Freq: Four times a day (QID) | ORAL | 0 refills | Status: DC | PRN

## 2018-04-12 NOTE — Progress Notes (Signed)
Discharge order received. Patient is alert and oriented. Vital signs stable . No signs of acute distress. Discharge instructions given. Patient verbalized understanding. No other issues noted at this time.   

## 2018-04-12 NOTE — Care Management (Signed)
Discharge to home today per Dr. Hazle Quant. Primary care physician is listed as Dr. Kinnie Feil. Family will transport. No follow-up needs identified. Gwenette Greet RN MSN CCM Care Management [phone]

## 2018-04-12 NOTE — Discharge Summary (Signed)
SURGICAL DISCHARGE SUMMARY NOTE   Hospital Day(s): 2.   Post op day(s):  Brandon [name]   Interval History: Patient seen and examined, no acute events or new complaints overnight. Patient reports improved pain, denies nausea or vomiting or increased pain with oral intake.  Vital signs in last 24 hours: [min-max] current  Temp:  [98.1 F (36.7 C)-98.5 F (36.9 C)] 98.5 F (36.9 C) (09/02 0418) Pulse Rate:  [68-73] 73 (09/02 0418) Resp:  [18-20] 18 (09/02 0418) BP: (132-140)/(87-96) 132/87 (09/02 0418) SpO2:  [97 %-98 %] 98 % (09/02 0418)     Height: 6' (182.9 cm) Weight: 87.8 kg BMI (Calculated): 26.25   Physical Exam:  Constitutional: alert, cooperative and no distress  Respiratory: breathing non-labored at rest  Cardiovascular: regular rate and sinus rhythm  Gastrointestinal: soft, non-tender, and non-distended  Labs:  CBC Latest Ref Rng & Units 04/12/2018 04/11/2018 04/10/2018  WBC 3.8 - 10.6 K/uL 6.6 6.1 9.4  Hemoglobin 13.0 - 18.0 g/dL 10.4(L) 9.9(L) 9.3(L)  Hematocrit 40.0 - 52.0 % 29.3(L) 29.4(L) 27.8(L)  Platelets 150 - 440 K/uL 429 405 364   CMP Latest Ref Rng & Units 04/12/2018 04/11/2018 04/10/2018  Glucose 70 - 99 mg/dL 98 97 97  BUN 8 - 23 mg/dL 10 6(L) 8  Creatinine 0.10 - 1.24 mg/dL 0.71 2.19 7.58  Sodium 135 - 145 mmol/L 135 136 132(L)  Potassium 3.5 - 5.1 mmol/L 3.7 3.7 3.8  Chloride 98 - 111 mmol/L 100 99 100  CO2 22 - 32 mmol/L 27 28 26   Calcium 8.9 - 10.3 mg/dL 9.1 8.9 8.3(G)  Total Protein 6.5 - 8.1 g/dL 6.5 6.0(L) 5.7(L)  Total Bilirubin 0.3 - 1.2 mg/dL 0.3 0.4 0.5  Alkaline Phos 38 - 126 U/L 51 53 48  AST 15 - 41 U/L 15 17 16   ALT 0 - 44 U/L 13 13 12     Imaging studies: No new pertinent imaging studies   Assessment/Plan:  62 y.o. male with acute over chronic cholecystitis who responded to oral antibiotic therapy. Patient tolerated soft diet and no nausea or vomiting. There is no leukocytosis. Ambulating without pain. Will discharge patient today. His pharmacy was  called and will be open today.   Allergies as of 04/12/2018      Reactions   Alcohol-sulfur [sulfur] Other (See Comments)   Unknown   Benadryl [diphenhydramine] Other (See Comments)   "stuffy"   Compazine [prochlorperazine] Other (See Comments)   Dystonic rxn   Depakote [divalproex Sodium] Other (See Comments)   Unknown   Dramamine [dimenhydrinate] Swelling      Medication List    TAKE these medications   acetaminophen 500 MG tablet Commonly known as:  TYLENOL Take 1,000 mg by mouth every 6 (six) hours as needed for moderate pain or headache.   aluminum-magnesium hydroxide-simethicone 200-200-20 MG/5ML Susp Commonly known as:  MAALOX Take 30 mLs by mouth 4 (four) times daily -  before meals and at bedtime.   amLODipine 5 MG tablet Commonly known as:  NORVASC Take 5 mg by mouth daily.   amoxicillin-clavulanate 875-125 MG tablet Commonly known as:  AUGMENTIN Take 1 tablet by mouth 2 (two) times daily for 10 days.   aspirin EC 81 MG tablet Take 324 mg by mouth 2 (two) times daily.   buPROPion 100 MG 12 hr tablet Commonly known as:  WELLBUTRIN SR Take 100 mg by mouth daily.   carvedilol 25 MG tablet Commonly known as:  COREG Take 25 mg by mouth 2 (two) times  daily with a meal.   cloZAPine 100 MG tablet Commonly known as:  CLOZARIL Take 200 mg by mouth at bedtime.   docusate sodium 100 MG capsule Commonly known as:  COLACE Take 100 mg by mouth daily.   ezetimibe 10 MG tablet Commonly known as:  ZETIA Take 10 mg by mouth at bedtime.   famotidine 20 MG tablet Commonly known as:  PEPCID Take 20 mg by mouth 2 (two) times daily. What changed:  Another medication with the same name was added. Make sure you understand how and when to take each.   famotidine 20 MG tablet Commonly known as:  PEPCID Take 1 tablet (20 mg total) by mouth 2 (two) times daily. What changed:  You were already taking a medication with the same name, and this prescription was added. Make sure  you understand how and when to take each.   fenofibrate 160 MG tablet Take 160 mg by mouth daily.   fluticasone 50 MCG/ACT nasal spray Commonly known as:  FLONASE Place 1 spray into both nostrils daily.   HYDROcodone-acetaminophen 5-325 MG tablet Commonly known as:  NORCO/VICODIN Take 1-2 tablets by mouth every 4 (four) hours as needed for moderate pain.   hydrOXYzine 25 MG capsule Commonly known as:  VISTARIL Take 25 mg by mouth 2 (two) times daily as needed for anxiety.   isosorbide mononitrate 30 MG 24 hr tablet Commonly known as:  IMDUR Take 30 mg by mouth 2 (two) times daily.   latanoprost 0.005 % ophthalmic solution Commonly known as:  XALATAN Place 1 drop into both eyes at bedtime.   magnesium hydroxide 400 MG/5ML suspension Commonly known as:  MILK OF MAGNESIA Take 5 mLs by mouth daily.   metoprolol succinate 25 MG 24 hr tablet Commonly known as:  TOPROL-XL Take 25 mg by mouth daily.   nitroGLYCERIN 0.4 MG SL tablet Commonly known as:  NITROSTAT Place 0.4 mg under the tongue every 5 (five) minutes as needed for chest pain.   ondansetron 4 MG disintegrating tablet Commonly known as:  ZOFRAN-ODT Take 1 tablet (4 mg total) by mouth every 8 (eight) hours as needed for nausea or vomiting.   ondansetron 4 MG disintegrating tablet Commonly known as:  ZOFRAN-ODT Take 1 tablet (4 mg total) by mouth every 6 (six) hours as needed for nausea.   PARoxetine 20 MG tablet Commonly known as:  PAXIL Take 20 mg by mouth daily.   simvastatin 40 MG tablet Commonly known as:  ZOCOR Take 40 mg by mouth at bedtime.   SYMBICORT 80-4.5 MCG/ACT inhaler Generic drug:  budesonide-formoterol Inhale 2 puffs into the lungs 2 (two) times daily.   tamsulosin 0.4 MG Caps capsule Commonly known as:  FLOMAX Take 0.4 mg by mouth daily.   timolol 0.5 % ophthalmic solution Commonly known as:  TIMOPTIC Place 1 drop into both eyes daily.      Discharge encounter time > 30 minutes.    Gae Gallop, MD

## 2018-04-12 NOTE — Discharge Instructions (Signed)
°  Diet: Resume home heart healthy regular diet.   Activity: Increase activity as tolerated.   Medications: Resume all home medications. For mild to moderate pain: acetaminophen (Tylenol) or ibuprofen (if no kidney disease). Combining Tylenol with alcohol can substantially increase your risk of causing liver disease. Narcotic pain medications, if prescribed, can be used for severe pain, though may cause nausea, constipation, and drowsiness. Do not combine Tylenol and Norco within a 6 hour period as Norco contains Tylenol. If you do not need the narcotic pain medication, you do not need to fill the prescription.  Complete antibiotic therapy as prescribed.   Call office [phone]) at any time if any questions, worsening pain, fevers/chills, bleeding, drainage from incision site, or other concerns.

## 2018-04-15 ENCOUNTER — Telehealth: Payer: Self-pay

## 2018-04-15 NOTE — Telephone Encounter (Signed)
EMMI Follow-up: Noted on the report that the patient hadn't read his discharge paperwork, wasn't sure who to call if there was a change in his condition and didn't have a follow-up appointment.  I talked with Brandon [name] and he said he had now read over his paperwork and saw he has some follow-up appointments.  He has also made a follow-up appointment to see his PCP on Friday. I advised him to contact his PCP if he had any change in his condition and let him know there would be a second automated call and to let us know at that time if he had any concerns.

## 2018-04-15 NOTE — Telephone Encounter (Signed)
Addendum: Brandon [name] said he appreciated all the care he received while he was here and was very pleased with everyone's kindess and nice attitude's. I thanked him for sharing that about the team.

## 2018-04-23 ENCOUNTER — Other Ambulatory Visit: Payer: Self-pay

## 2018-04-23 ENCOUNTER — Encounter: Payer: Self-pay | Admitting: Emergency Medicine

## 2018-04-23 ENCOUNTER — Emergency Department
Admission: EM | Admit: 2018-04-23 | Discharge: 2018-04-23 | Disposition: A | Discharge status: Home / Self Care | Payer: Medicare Other | Attending: Emergency Medicine | Admitting: Emergency Medicine

## 2018-04-23 DIAGNOSIS — I959 Hypotension, unspecified: Secondary | ICD-10-CM | POA: Diagnosis not present

## 2018-04-23 DIAGNOSIS — E785 Hyperlipidemia, unspecified: Secondary | ICD-10-CM | POA: Diagnosis not present

## 2018-04-23 DIAGNOSIS — Z79899 Other long term (current) drug therapy: Secondary | ICD-10-CM | POA: Diagnosis not present

## 2018-04-23 DIAGNOSIS — E860 Dehydration: Secondary | ICD-10-CM | POA: Diagnosis not present

## 2018-04-23 DIAGNOSIS — Z87891 Personal history of nicotine dependence: Secondary | ICD-10-CM | POA: Insufficient documentation

## 2018-04-23 DIAGNOSIS — K8050 Calculus of bile duct without cholangitis or cholecystitis without obstruction: Secondary | ICD-10-CM | POA: Diagnosis not present

## 2018-04-23 DIAGNOSIS — I2510 Atherosclerotic heart disease of native coronary artery without angina pectoris: Secondary | ICD-10-CM | POA: Diagnosis not present

## 2018-04-23 DIAGNOSIS — Z7982 Long term (current) use of aspirin: Secondary | ICD-10-CM | POA: Insufficient documentation

## 2018-04-23 DIAGNOSIS — J449 Chronic obstructive pulmonary disease, unspecified: Secondary | ICD-10-CM | POA: Diagnosis not present

## 2018-04-23 LAB — CBC WITH DIFFERENTIAL/PLATELET
BASOS ABS: 0.1 10*3/uL (ref 0–0.1)
Basophils Relative: 1 %
EOS PCT: 1 %
Eosinophils Absolute: 0.1 10*3/uL (ref 0–0.7)
HCT: 30.3 % — ABNORMAL LOW (ref 40.0–52.0)
Hemoglobin: 10.3 g/dL — ABNORMAL LOW (ref 13.0–18.0)
LYMPHS PCT: 15 %
Lymphs Abs: 1.3 10*3/uL (ref 1.0–3.6)
MCH: 27.4 pg (ref 26.0–34.0)
MCHC: 34.1 g/dL (ref 32.0–36.0)
MCV: 80.3 fL (ref 80.0–100.0)
MONO ABS: 0.8 10*3/uL (ref 0.2–1.0)
MONOS PCT: 10 %
Neutro Abs: 5.9 10*3/uL (ref 1.4–6.5)
Neutrophils Relative %: 73 %
PLATELETS: 410 10*3/uL (ref 150–440)
RBC: 3.77 MIL/uL — ABNORMAL LOW (ref 4.40–5.90)
RDW: 15 % — AB (ref 11.5–14.5)
WBC: 8.2 10*3/uL (ref 3.8–10.6)

## 2018-04-23 LAB — URINE DRUG SCREEN, QUALITATIVE (ARMC ONLY)
AMPHETAMINES, UR SCREEN: NOT DETECTED
BARBITURATES, UR SCREEN: NOT DETECTED
Benzodiazepine, Ur Scrn: NOT DETECTED
COCAINE METABOLITE, UR ~~LOC~~: NOT DETECTED
Cannabinoid 50 Ng, Ur ~~LOC~~: NOT DETECTED
MDMA (ECSTASY) UR SCREEN: POSITIVE — AB
METHADONE SCREEN, URINE: NOT DETECTED
OPIATE, UR SCREEN: POSITIVE — AB
Phencyclidine (PCP) Ur S: NOT DETECTED
TRICYCLIC, UR SCREEN: NOT DETECTED

## 2018-04-23 LAB — COMPREHENSIVE METABOLIC PANEL
ALK PHOS: 50 U/L (ref 38–126)
ALT: 13 U/L (ref 0–44)
AST: 20 U/L (ref 15–41)
Albumin: 3.5 g/dL (ref 3.5–5.0)
Anion gap: 8 (ref 5–15)
BILIRUBIN TOTAL: 0.5 mg/dL (ref 0.3–1.2)
BUN: 8 mg/dL (ref 8–23)
CALCIUM: 8.7 mg/dL — AB (ref 8.9–10.3)
CO2: 26 mmol/L (ref 22–32)
CREATININE: 1.29 mg/dL — AB (ref 0.61–1.24)
Chloride: 97 mmol/L — ABNORMAL LOW (ref 98–111)
GFR calc non Af Amer: 58 mL/min — ABNORMAL LOW (ref >60)
GLUCOSE: 114 mg/dL — AB (ref 70–99)
Potassium: 4 mmol/L (ref 3.5–5.1)
Sodium: 131 mmol/L — ABNORMAL LOW (ref 135–145)
TOTAL PROTEIN: 6.7 g/dL (ref 6.5–8.1)

## 2018-04-23 LAB — URINALYSIS, ROUTINE W REFLEX MICROSCOPIC
BILIRUBIN URINE: NEGATIVE
Glucose, UA: NEGATIVE mg/dL
HGB URINE DIPSTICK: NEGATIVE
Ketones, ur: NEGATIVE mg/dL
LEUKOCYTES UA: NEGATIVE
Nitrite: NEGATIVE
Protein, ur: NEGATIVE mg/dL
Specific Gravity, Urine: 1.004 — ABNORMAL LOW (ref 1.005–1.030)
pH: 6 (ref 5.0–8.0)

## 2018-04-23 LAB — LIPASE, BLOOD: Lipase: 27 U/L (ref 11–51)

## 2018-04-23 LAB — TROPONIN I

## 2018-04-23 LAB — ETHANOL: Alcohol, Ethyl (B): <10 mg/dL (ref <10)

## 2018-04-23 LAB — LACTIC ACID, PLASMA: LACTIC ACID, VENOUS: 1.5 mmol/L (ref 0.5–1.9)

## 2018-04-23 MED ORDER — HYDROCODONE-ACETAMINOPHEN 5-325 MG PO TABS: 1.0000 | ORAL_TABLET | ORAL | 0 refills | Status: DC | PRN

## 2018-04-23 MED ORDER — SODIUM CHLORIDE 0.9 % IV BOLUS
1000.0000 mL | Freq: Once | INTRAVENOUS | Status: AC
  Administered 2018-04-23: 1000 mL via INTRAVENOUS

## 2018-04-23 MED ORDER — DOCUSATE SODIUM 100 MG PO CAPS: ORAL_CAPSULE | ORAL | 0 refills | Status: DC

## 2018-04-23 NOTE — ED Triage Notes (Signed)
Says had sepsis about a week ago from gall bladder.  [facility] and was hypotensive in office.  He says he does have dizziness for 2-3 days.

## 2018-04-23 NOTE — ED Triage Notes (Signed)
First Nurse Note:  Arrives from PCP office for ED evaluation for hypotension.  Patient is AAOx3.  Skin warm and dry. NAD

## 2018-04-23 NOTE — Discharge Instructions (Addendum)
As we discussed, we think your low blood pressure was due to dehydration from not feeling well due to your gallbladder disease.  Be sure and drink plenty of fluids and call Dr. Geoffery LyonsSakai's office first thing in the morning on Monday to schedule a visit since you were not able to see him today.  Return to the emergency department if you develop new or worsening symptoms that concern you.

## 2018-04-23 NOTE — ED Provider Notes (Signed)
[facility] Emergency Department Provider Note  ____________________________________________   First MD Initiated Contact with Patient 04/23/18 1309     (approximate)  I have reviewed the triage vital signs and the nursing notes.   HISTORY  Chief Complaint Hypotension    HPI Brandon [name] is a 62 y.o. male with medical history as listed below who presents from surgery clinic for evaluation of hypotension.  The patient was diagnosed with cholelithiasis a few weeks ago and followed up with surgery as an outpatient today.  When he arrived to the clinic he was told that his blood pressure was low and needed to go to the emergency department.  He said that he did feel lightheaded and dizzy but after a liter of fluids those symptoms have resolved.  He is used to having abdominal pain tickly after he eats.  It is not been worse than usual.  His dizziness was mild to moderate.  He denies fever/chills, chest pain, shortness of breath.  He has not been vomiting or having diarrhea.  He does report that because of the abdominal pain being worse after he eats, he has been eating and drinking a lot less than usual recently.  Past Medical History:  Diagnosis Date  . Anemia    IDA  . Colon polyps   . COPD (chronic obstructive pulmonary disease) (HCC)   . Coronary artery disease   . Heart attack (HCC)   . Hiatal hernia   . History of ETOH abuse   . Hyperlipidemia   . Hypertension   . Pleurisy   . Schizophrenia (HCC)   . Schizophrenia (HCC)   . Seizures (HCC)   . Suicide attempt [facility])     Patient Active Problem List   Diagnosis Date Noted  . Acute cholecystitis 04/10/2018  . Chronic cholecystitis 04/08/2018  . Atherosclerosis of native arteries of extremity with intermittent claudication (HCC) 11/22/2017  . Essential hypertension 11/22/2017  . Hyperlipidemia 11/22/2017  . COPD (chronic obstructive pulmonary disease) (HCC) 11/22/2017  . CAD (coronary artery  disease) 10/15/2017    Past Surgical History:  Procedure Laterality Date  . ABDOMINAL SURGERY     internal bleeding  . COLONOSCOPY    . COLONOSCOPY WITH PROPOFOL N/A 05/09/2016   Procedure: COLONOSCOPY WITH PROPOFOL;  Surgeon: Scot Jun, MD;  Location: [facility] ENDOSCOPY;  Service: Endoscopy;  Laterality: N/A;  . CORONARY ANGIOPLASTY WITH STENT PLACEMENT    . ESOPHAGOGASTRODUODENOSCOPY (EGD) WITH PROPOFOL N/A 05/09/2016   Procedure: ESOPHAGOGASTRODUODENOSCOPY (EGD) WITH PROPOFOL;  Surgeon: Scot Jun, MD;  Location: [facility] ENDOSCOPY;  Service: Endoscopy;  Laterality: N/A;  . LEFT HEART CATH Right 10/19/2017   Procedure: Left Heart Cath and Coronary Angiography;  Surgeon: Laurier Nancy, MD;  Location: [facility] INVASIVE CV LAB;  Service: Cardiovascular;  Laterality: Right;  . NOSE SURGERY    . TOE AMPUTATION    . VASECTOMY      Prior to Admission medications   Medication Sig Start Date End Date Taking? Authorizing Provider  acetaminophen (TYLENOL) 500 MG tablet Take 1,000 mg by mouth every 6 (six) hours as needed for moderate pain or headache.    [provider]  aluminum-magnesium hydroxide-simethicone (MAALOX) 200-200-20 MG/5ML SUSP Take 30 mLs by mouth 4 (four) times daily -  before meals and at bedtime. 04/08/18   Sharman Cheek, MD  amLODipine (NORVASC) 5 MG tablet Take 5 mg by mouth daily.    [provider]  aspirin EC 81 MG tablet Take 324  mg by mouth 2 (two) times daily.    [provider]  buPROPion (WELLBUTRIN SR) 100 MG 12 hr tablet Take 100 mg by mouth daily.     [provider]  carvedilol (COREG) 25 MG tablet Take 25 mg by mouth 2 (two) times daily with a meal.    [provider]  cloZAPine (CLOZARIL) 100 MG tablet Take 200 mg by mouth at bedtime.     [provider]  docusate sodium (COLACE) 100 MG capsule Take 1 tablet once or twice daily as needed for constipation while taking narcotic pain medicine 04/23/18    Loleta Rose, MD  ezetimibe (ZETIA) 10 MG tablet Take 10 mg by mouth at bedtime.     [provider]  famotidine (PEPCID) 20 MG tablet Take 20 mg by mouth 2 (two) times daily.    [provider]  famotidine (PEPCID) 20 MG tablet Take 1 tablet (20 mg total) by mouth 2 (two) times daily. 04/08/18   Sharman Cheek, MD  fenofibrate 160 MG tablet Take 160 mg by mouth daily.    [provider]  fluticasone (FLONASE) 50 MCG/ACT nasal spray Place 1 spray into both nostrils daily.    [provider]  HYDROcodone-acetaminophen (NORCO/VICODIN) 5-325 MG tablet Take 1-2 tablets by mouth every 4 (four) hours as needed for moderate pain. 04/23/18   Loleta Rose, MD  hydrOXYzine (VISTARIL) 25 MG capsule Take 25 mg by mouth 2 (two) times daily as needed for anxiety.    [provider]  isosorbide mononitrate (IMDUR) 30 MG 24 hr tablet Take 30 mg by mouth 2 (two) times daily.     [provider]  latanoprost (XALATAN) 0.005 % ophthalmic solution Place 1 drop into both eyes at bedtime. 09/15/17   [provider]  magnesium hydroxide (MILK OF MAGNESIA) 400 MG/5ML suspension Take 5 mLs by mouth daily.    [provider]  metoprolol succinate (TOPROL-XL) 25 MG 24 hr tablet Take 25 mg by mouth daily.    [provider]  nitroGLYCERIN (NITROSTAT) 0.4 MG SL tablet Place 0.4 mg under the tongue every 5 (five) minutes as needed for chest pain.     [provider]  ondansetron (ZOFRAN ODT) 4 MG disintegrating tablet Take 1 tablet (4 mg total) by mouth every 8 (eight) hours as needed for nausea or vomiting. 04/08/18   Sharman Cheek, MD  ondansetron (ZOFRAN-ODT) 4 MG disintegrating tablet Take 1 tablet (4 mg total) by mouth every 6 (six) hours as needed for nausea. 04/12/18   Carolan Shiver, MD  PARoxetine (PAXIL) 20 MG tablet Take 20 mg by mouth daily.    [provider]  simvastatin (ZOCOR) 40 MG tablet Take 40 mg by  mouth at bedtime.     [provider]  SYMBICORT 80-4.5 MCG/ACT inhaler Inhale 2 puffs into the lungs 2 (two) times daily. 09/15/17   [provider]  tamsulosin (FLOMAX) 0.4 MG CAPS capsule Take 0.4 mg by mouth daily.     [provider]  timolol (TIMOPTIC) 0.5 % ophthalmic solution Place 1 drop into both eyes daily. 09/15/17   [provider]    Allergies Alcohol-sulfur [sulfur]; Benadryl [diphenhydramine]; Compazine [prochlorperazine]; Depakote [divalproex sodium]; and Dramamine [dimenhydrinate]  Family History  Problem Relation Age of Onset  . Prostate cancer Neg Hx   . Bladder Cancer Neg Hx   . Kidney cancer Neg Hx     Social History Social History   Tobacco Use  . Smoking  status: Former Smoker    Packs/day: 1.00    Last attempt to quit: 2012    Years since quitting: 7.7  . Smokeless tobacco: Former Engineer, waterUser  Substance Use Topics  . Alcohol use: No  . Drug use: No    Review of Systems Constitutional: No fever/chills.  Some lightheadedness today. Eyes: No visual changes. ENT: No sore throat. Cardiovascular: Denies chest pain. Respiratory: Denies shortness of breath. Gastrointestinal: Ongoing abdominal pain and nausea for the last few weeks, no recent vomiting.  As described above. Genitourinary: Negative for dysuria. Musculoskeletal: Negative for neck pain.  Negative for back pain. Integumentary: Negative for rash. Neurological: Negative for headaches, focal weakness or numbness.   ____________________________________________   PHYSICAL EXAM:  VITAL SIGNS: ED Triage Vitals  Enc Vitals Group     BP 04/23/18 1228 (!) 77/53     Pulse Rate 04/23/18 1228 73     Resp 04/23/18 1228 16     Temp 04/23/18 1228 97.6 F (36.4 C)     Temp Source 04/23/18 1228 Oral     SpO2 04/23/18 1228 99 %     Weight 04/23/18 1231 87.8 kg (193 lb 9 oz)     Height 04/23/18 1231 1.829 m (6')     Head Circumference --      Peak Flow --      Pain Score  04/23/18 1231 0     Pain Loc --      Pain Edu? --      Excl. in GC? --     Constitutional: Alert and oriented. Well appearing and in no acute distress. Eyes: Conjunctivae are normal.  Head: Atraumatic. Nose: No congestion/rhinnorhea. Mouth/Throat: Mucous membranes are moist. Neck: No stridor.  No meningeal signs.   Cardiovascular: Normal rate, regular rhythm. Good peripheral circulation. Grossly normal heart sounds. Respiratory: Normal respiratory effort.  No retractions. Lungs CTAB. Gastrointestinal: Soft and nontender.  No rebound or guarding.  Negative Murphy sign. Musculoskeletal: No lower extremity tenderness nor edema. No gross deformities of extremities. Neurologic:  Normal speech and language. No gross focal neurologic deficits are appreciated.  Skin:  Skin is warm, dry and intact. No rash noted. Psychiatric: Mood and affect are normal. Speech and behavior are normal.  ____________________________________________   LABS (all labs ordered are listed, but only abnormal results are displayed)  Labs Reviewed  COMPREHENSIVE METABOLIC PANEL - Abnormal; Notable for the following components:      Result Value   Sodium 131 (*)    Chloride 97 (*)    Glucose, Bld 114 (*)    Creatinine, Ser 1.29 (*)    Calcium 8.7 (*)    GFR calc non Af Amer 58 (*)    All other components within normal limits  CBC WITH DIFFERENTIAL/PLATELET - Abnormal; Notable for the following components:   RBC 3.77 (*)    Hemoglobin 10.3 (*)    HCT 30.3 (*)    RDW 15.0 (*)    All other components within normal limits  URINALYSIS, ROUTINE W REFLEX MICROSCOPIC - Abnormal; Notable for the following components:   Color, Urine YELLOW (*)    APPearance CLEAR (*)    Specific Gravity, Urine 1.004 (*)    All other components within normal limits  URINE DRUG SCREEN, QUALITATIVE ([facility] ONLY) - Abnormal; Notable for the following components:   MDMA (Ecstasy)Ur Screen POSITIVE (*)    Opiate, Ur Screen POSITIVE (*)     All other components within normal limits  LIPASE, BLOOD  TROPONIN I  LACTIC ACID, PLASMA  ETHANOL  LACTIC ACID, PLASMA   ____________________________________________  EKG  ED ECG REPORT I, Loleta Rose, the attending physician, personally viewed and interpreted this ECG.  Date: 04/23/2018 EKG Time: 13: 26 Rate: 69 Rhythm: normal sinus rhythm QRS Axis: normal Intervals: LVH ST/T Wave abnormalities: normal Narrative Interpretation: no evidence of acute ischemia  ____________________________________________  RADIOLOGY   ED MD interpretation: No indication for imaging  Official radiology report(s): No results found.  ____________________________________________   PROCEDURES  Critical Care performed: No   Procedure(s) performed:   Procedures   ____________________________________________   INITIAL IMPRESSION / ASSESSMENT AND PLAN / ED COURSE  As part of my medical decision making, I reviewed the following data within the electronic MEDICAL RECORD NUMBER Nursing notes reviewed and incorporated, EKG interpreted , Old chart reviewed and Notes from prior ED visits    Differential diagnosis includes, but is not limited to, volume depletion secondary to decreased oral intake, acute infection including sepsis, acute cholecystitis or other gallbladder disease, etc.  Fortunately patient is well-appearing in no acute distress.  Initially he was hypotensive but I gave him a full 2 L of fluid and his blood pressure has stabilized.  He is asymptomatic.  He has not had any chest pain or shortness of breath at any time and has a reassuring EKG.  Lab work is within normal limits with a normal troponin, normal lactic acid, normal lipase, and essentially normal conference of metabolic panel except for slightly elevated creatinine.  I counseled him about the importance of drinking plenty of fluids to stay hydrated and following up with surgery.  I asked my secretary to check with the  [facility] clinic surgery office as soon as I knew that the patient was going to be discharged but they closed early today since it is Friday.  They advised that he will need to call on Monday morning to schedule a follow-up appointment.  I passed this information along and also gave my usual and customary return precautions.  He and his wife understand and agree with the plan.     ____________________________________________  FINAL CLINICAL IMPRESSION(S) / ED DIAGNOSES  Final diagnoses:  Hypotension, unspecified hypotension type  Biliary colic  Dehydration     MEDICATIONS GIVEN DURING THIS VISIT:  Medications  sodium chloride 0.9 % bolus 1,000 mL (0 mLs Intravenous Stopped 04/23/18 1343)  sodium chloride 0.9 % bolus 1,000 mL (0 mLs Intravenous Stopped 04/23/18 1445)     ED Discharge Orders         Ordered    HYDROcodone-acetaminophen (NORCO/VICODIN) 5-325 MG tablet  Every 4 hours PRN     04/23/18 1510    docusate sodium (COLACE) 100 MG capsule     04/23/18 1510           Note:  This document was prepared using Dragon voice recognition software and may include unintentional dictation errors.    Loleta Rose, MD 04/23/18 [phone]

## 2018-04-30 ENCOUNTER — Encounter
Admission: RE | Admit: 2018-04-30 | Discharge: 2018-04-30 | Disposition: A | Discharge status: Home / Self Care | Payer: Medicare Other | Source: Ambulatory Visit | Attending: Surgery | Admitting: Surgery

## 2018-04-30 ENCOUNTER — Other Ambulatory Visit: Payer: Self-pay

## 2018-04-30 ENCOUNTER — Ambulatory Visit: Payer: Self-pay | Admitting: Surgery

## 2018-04-30 DIAGNOSIS — Z01812 Encounter for preprocedural laboratory examination: Secondary | ICD-10-CM | POA: Diagnosis not present

## 2018-04-30 LAB — COMPREHENSIVE METABOLIC PANEL
ALBUMIN: 3.6 g/dL (ref 3.5–5.0)
ALT: 13 U/L (ref 0–44)
ANION GAP: 7 (ref 5–15)
AST: 18 U/L (ref 15–41)
Alkaline Phosphatase: 53 U/L (ref 38–126)
BUN: 7 mg/dL — AB (ref 8–23)
CHLORIDE: 100 mmol/L (ref 98–111)
CO2: 25 mmol/L (ref 22–32)
Calcium: 9 mg/dL (ref 8.9–10.3)
Creatinine, Ser: 1.2 mg/dL (ref 0.61–1.24)
GFR calc Af Amer: >60 mL/min (ref >60)
GFR calc non Af Amer: >60 mL/min (ref >60)
GLUCOSE: 110 mg/dL — AB (ref 70–99)
POTASSIUM: 4.8 mmol/L (ref 3.5–5.1)
SODIUM: 132 mmol/L — AB (ref 135–145)
Total Bilirubin: 0.3 mg/dL (ref 0.3–1.2)
Total Protein: 6.4 g/dL — ABNORMAL LOW (ref 6.5–8.1)

## 2018-04-30 LAB — CBC WITH DIFFERENTIAL/PLATELET
BASOS ABS: 0.1 10*3/uL (ref 0–0.1)
BASOS PCT: 1 %
Eosinophils Absolute: 0.1 10*3/uL (ref 0–0.7)
Eosinophils Relative: 2 %
HEMATOCRIT: 31.2 % — AB (ref 40.0–52.0)
HEMOGLOBIN: 10.4 g/dL — AB (ref 13.0–18.0)
Lymphocytes Relative: 21 %
Lymphs Abs: 1.3 10*3/uL (ref 1.0–3.6)
MCH: 26.7 pg (ref 26.0–34.0)
MCHC: 33.3 g/dL (ref 32.0–36.0)
MCV: 80.1 fL (ref 80.0–100.0)
MONO ABS: 0.5 10*3/uL (ref 0.2–1.0)
MONOS PCT: 8 %
NEUTROS ABS: 4.3 10*3/uL (ref 1.4–6.5)
NEUTROS PCT: 68 %
Platelets: 380 10*3/uL (ref 150–440)
RBC: 3.9 MIL/uL — ABNORMAL LOW (ref 4.40–5.90)
RDW: 15.2 % — AB (ref 11.5–14.5)
WBC: 6.3 10*3/uL (ref 3.8–10.6)

## 2018-04-30 LAB — LIPASE, BLOOD: Lipase: 32 U/L (ref 11–51)

## 2018-04-30 NOTE — H&P (View-Only) (Signed)
Subjective:   CC: Chronic cholecystitis [K81.1]  HPI:  Brandon [name] is a 62 y.o. male here for evaluation of above CC. Still complaning of persistent pain in RUQ requiring meds, nausea present as well.  States he is able to keep some fluids and food down.  Having regular BMs.  Past Medical History:  has a past medical history of Anxiety, COPD (chronic obstructive pulmonary disease) (CMS-HCC), Epilepsy (CMS-HCC), Heart disease, Hypertension, Pleurisy, and Suicide attempt (CMS-HCC).  Past Surgical History:  has a past surgical history that includes nose surgery (1962); Vasectomy; Coronary angioplasty; Amputation Toe; Colonoscopy (12/17/2010); egd (12/17/2010); Colonoscopy (05/09/2016); and egd (05/09/2016).  Family History: family history includes No Known Problems in his mother.  Social History:  reports that he quit smoking about 7 years ago. He smoked 1.00 pack per day. He has quit using smokeless tobacco. He reports that he does not drink alcohol. His drug history is not on file.  Current Medications: has a current medication list which includes the following prescription(s): amlodipine, budesonide-formoterol, bupropion, carvedilol, clozapine, docusate, ezetimibe, famotidine, fenofibrate, fluticasone propionate, hydrocodone-acetaminophen, hydroxyzine, isosorbide mononitrate, metoprolol succinate, paroxetine, simvastatin, and tamsulosin.  Allergies:       Allergies as of 04/28/2018 - Reviewed 04/28/2018  Allergen Reaction Noted  . Compazine [prochlorperazine edisylate] Anaphylaxis 05/22/2014  . Atorvastatin Other (See Comments) 11/22/2012  . Benadryl [diphenhydramine-zinc acetate] Dizziness 05/31/2014  . Depakote [divalproex] Other (See Comments) 11/15/2009  . Dimenhydrinate Swelling 04/19/2015  . Divalproex sodium Unknown 05/08/2016  . Dramamine ii [meclizine] Swelling 05/31/2014  . Prochlorperazine Other (See Comments) 04/19/2015  . Rosuvastatin Other (See Comments)  03/07/2014  . Sulfur Other (See Comments) 10/16/2017    ROS:  A 15 point review of systems was performed and pertinent positives and negatives noted in HPI    Objective:   BP 90/57   Pulse 78   Temp 36.3 C (97.4 F) (Oral)   Ht 185.4 cm (6\' 1" )   Wt 88.9 kg (195 lb 15.8 oz)   BMI 25.86 kg/m    Constitutional :  alert, appears stated age, cooperative and no distress  Gastrointestinal: soft, but obvious tenderness with some guarding noted in RUQ and epigastric area..    Musculoskeletal: Steady gait and movement  Skin: Cool and moist  Psychiatric: Normal affect, non-agitated, not confused       LABS:  n/a   RADS: n/a Assessment:      Chronic cholecystitis [K81.1]  Plan:   1. Chronic cholecystitis [K81.1] Discussed the risk of surgery including post-op infxn, seroma, biloma, chronic pain, poor-delayed wound healing, retained gallstone, conversion to open procedure, post-op SBO or ileus, and need for additional procedures to address said risks.  The risks of general anesthetic including MI, CVA, sudden death or even reaction to anesthetic medications also discussed. Alternatives include continued observation.  Benefits include possible symptom relief, prevention of complications including acute cholecystitis, pancreatitis.  Typical post operative recovery of 3-5 days rest, continued pain in area and incision sites, possible loose stools up to 4-6 weeks, also discussed.  ED return precautions given for sudden increase in RUQ pain, with possible accompanying fever, nausea, and/or vomiting.  The patient understands the risks, any and all questions were answered to the patient's satisfaction.  2. Patient has elected to proceed with surgical treatment.  Will obtain labs the am of procedure as well. Procedure will be scheduled.  Written consent was obtained..     Electronically signed by Sung AmabileSakai, Darcell Yacoub, DO on 04/28/2018 3:46 PM

## 2018-04-30 NOTE — Patient Instructions (Signed)
Your procedure is scheduled on: Tuesday, May 04, 2018 Report to Day Surgery [facility]nd [facility] To find out your arrival time, please call [phone] between 1PM - 3PM on: Monday, May 03, 2018  REMEMBER: Instructions that are not followed completely may result in serious medical risk, up to and including death; or upon the discretion of your surgeon and anesthesiologist your surgery may need to be rescheduled.  Do not eat food after midnight the night before surgery.  No gum chewing, lozengers or hard candies.  You may however, drink CLEAR liquids up to 2 hours before you are scheduled to arrive for your surgery. Do not drink anything within 2 hours of the start of your surgery.  Clear liquids include: - water  - apple juice without pulp - gatorade - black coffee or tea (Do NOT add milk or creamers to the coffee or tea) Do NOT drink anything that is not on this list.  No Alcohol for 24 hours before or after surgery.  No Smoking including e-cigarettes for 24 hours prior to surgery.  No chewable tobacco products for at least 6 hours prior to surgery.  No nicotine patches on the day of surgery.  On the morning of surgery brush your teeth with toothpaste and water, you may rinse your mouth with mouthwash if you wish. Do not swallow any toothpaste or mouthwash.  Notify your doctor if there is any change in your medical condition (cold, fever, infection).  Do not wear jewelry, make-up, hairpins, clips or nail polish.  Do not wear lotions, powders, or perfumes. You may wear deodorant.  Do not shave 48 hours prior to surgery. Men may shave face and neck.  Contacts and dentures may not be worn into surgery.  Do not bring valuables to the hospital, including drivers license, insurance or credit cards.  [facility] is not responsible for any belongings or valuables.   TAKE THESE MEDICATIONS THE MORNING OF SURGERY:  1.  Amlodipine 2.  Bupropion 3.   Carvedilol 4.  Famotidine 5.  Isosorbide mononitrate 6.  Metoprolol 7.  Symbicort inhaler  Use CHG Soap as directed on instruction sheet.  Use inhalers on the day of surgery and bring to the hospital.  NOW!  Stop Anti-inflammatories (NSAIDS) such as Advil, Aleve, Ibuprofen, Motrin, Naproxen, Naprosyn and Aspirin based products such as Excedrin, Goodys Powder, BC Powder. (May take Tylenol or Acetaminophen if needed.)  NOW!  Stop ANY OVER THE COUNTER supplements until after surgery.  Wear comfortable clothing (specific to your surgery type) to the hospital.  If you are being discharged the day of surgery, you will not be allowed to drive home. You will need a responsible adult to drive you home and stay with you that night.   If you are taking public transportation, you will need to have a responsible adult with you. Please confirm with your physician that it is acceptable to use public transportation.   Please call [phone] if you have any questions about these instructions.

## 2018-04-30 NOTE — Pre-Procedure Instructions (Signed)
Low B/P at today's visit 95/67 and 100/71. Patient has significant cardiac history and recent visit to the ED for hypotension. Dr. Randa NgoPiscitello (anesthesia) made aware; requested cardiac clearance prior to surgery. Clearance request form faxed to Dr. Milta DeitersS. Khan of [facility] along with a follow up phone call to make them aware of the clearance request prior to Tuesday's scheduled surgery.

## 2018-04-30 NOTE — H&P (Signed)
Subjective:   CC: Chronic cholecystitis [K81.1]  HPI:  Brandon [name] is a 62 y.o. male here for evaluation of above CC. Still complaning of persistent pain in RUQ requiring meds, nausea present as well.  States he is able to keep some fluids and food down.  Having regular BMs.  Past Medical History:  has a past medical history of Anxiety, COPD (chronic obstructive pulmonary disease) (CMS-HCC), Epilepsy (CMS-HCC), Heart disease, Hypertension, Pleurisy, and Suicide attempt (CMS-HCC).  Past Surgical History:  has a past surgical history that includes nose surgery (1962); Vasectomy; Coronary angioplasty; Amputation Toe; Colonoscopy (12/17/2010); egd (12/17/2010); Colonoscopy (05/09/2016); and egd (05/09/2016).  Family History: family history includes No Known Problems in his mother.  Social History:  reports that he quit smoking about 7 years ago. He smoked 1.00 pack per day. He has quit using smokeless tobacco. He reports that he does not drink alcohol. His drug history is not on file.  Current Medications: has a current medication list which includes the following prescription(s): amlodipine, budesonide-formoterol, bupropion, carvedilol, clozapine, docusate, ezetimibe, famotidine, fenofibrate, fluticasone propionate, hydrocodone-acetaminophen, hydroxyzine, isosorbide mononitrate, metoprolol succinate, paroxetine, simvastatin, and tamsulosin.  Allergies:       Allergies as of 04/28/2018 - Reviewed 04/28/2018  Allergen Reaction Noted  . Compazine [prochlorperazine edisylate] Anaphylaxis 05/22/2014  . Atorvastatin Other (See Comments) 11/22/2012  . Benadryl [diphenhydramine-zinc acetate] Dizziness 05/31/2014  . Depakote [divalproex] Other (See Comments) 11/15/2009  . Dimenhydrinate Swelling 04/19/2015  . Divalproex sodium Unknown 05/08/2016  . Dramamine ii [meclizine] Swelling 05/31/2014  . Prochlorperazine Other (See Comments) 04/19/2015  . Rosuvastatin Other (See Comments)  03/07/2014  . Sulfur Other (See Comments) 10/16/2017    ROS:  A 15 point review of systems was performed and pertinent positives and negatives noted in HPI    Objective:   BP 90/57   Pulse 78   Temp 36.3 C (97.4 F) (Oral)   Ht 185.4 cm (6' 1")   Wt 88.9 kg (195 lb 15.8 oz)   BMI 25.86 kg/m    Constitutional :  alert, appears stated age, cooperative and no distress  Gastrointestinal: soft, but obvious tenderness with some guarding noted in RUQ and epigastric area..    Musculoskeletal: Steady gait and movement  Skin: Cool and moist  Psychiatric: Normal affect, non-agitated, not confused       LABS:  n/a   RADS: n/a Assessment:      Chronic cholecystitis [K81.1]  Plan:   1. Chronic cholecystitis [K81.1] Discussed the risk of surgery including post-op infxn, seroma, biloma, chronic pain, poor-delayed wound healing, retained gallstone, conversion to open procedure, post-op SBO or ileus, and need for additional procedures to address said risks.  The risks of general anesthetic including MI, CVA, sudden death or even reaction to anesthetic medications also discussed. Alternatives include continued observation.  Benefits include possible symptom relief, prevention of complications including acute cholecystitis, pancreatitis.  Typical post operative recovery of 3-5 days rest, continued pain in area and incision sites, possible loose stools up to 4-6 weeks, also discussed.  ED return precautions given for sudden increase in RUQ pain, with possible accompanying fever, nausea, and/or vomiting.  The patient understands the risks, any and all questions were answered to the patient's satisfaction.  2. Patient has elected to proceed with surgical treatment.  Will obtain labs the am of procedure as well. Procedure will be scheduled.  Written consent was obtained..     Electronically signed by Alton Bouknight, DO on 04/28/2018 3:46 PM      

## 2018-05-03 MED ORDER — CEFAZOLIN SODIUM-DEXTROSE 2-4 GM/100ML-% IV SOLN
2.0000 g | INTRAVENOUS | Status: AC
  Administered 2018-05-04: 2 g via INTRAVENOUS

## 2018-05-03 NOTE — Pre-Procedure Instructions (Signed)
CLEARANCE BY DR Milta DeitersS KHAN LOW RISK ON CHART

## 2018-05-04 ENCOUNTER — Ambulatory Visit: Payer: Medicare Other | Admitting: Anesthesiology

## 2018-05-04 ENCOUNTER — Encounter
Admission: AD | Disposition: A | Discharge status: Home / Self Care | Payer: Self-pay | Source: Home / Self Care | Attending: Surgery

## 2018-05-04 ENCOUNTER — Inpatient Hospital Stay
Admission: AD | Admit: 2018-05-04 | Discharge: 2018-05-07 | DRG: 415 | Disposition: A | Discharge status: Home / Self Care | Payer: Medicare Other | Attending: Surgery | Admitting: Surgery

## 2018-05-04 ENCOUNTER — Other Ambulatory Visit: Payer: Self-pay

## 2018-05-04 DIAGNOSIS — J449 Chronic obstructive pulmonary disease, unspecified: Secondary | ICD-10-CM | POA: Diagnosis present

## 2018-05-04 DIAGNOSIS — G40909 Epilepsy, unspecified, not intractable, without status epilepticus: Secondary | ICD-10-CM | POA: Diagnosis present

## 2018-05-04 DIAGNOSIS — F419 Anxiety disorder, unspecified: Secondary | ICD-10-CM | POA: Diagnosis present

## 2018-05-04 DIAGNOSIS — I252 Old myocardial infarction: Secondary | ICD-10-CM

## 2018-05-04 DIAGNOSIS — K9161 Intraoperative hemorrhage and hematoma of a digestive system organ or structure complicating a digestive sytem procedure: Secondary | ICD-10-CM | POA: Diagnosis not present

## 2018-05-04 DIAGNOSIS — Z882 Allergy status to sulfonamides status: Secondary | ICD-10-CM

## 2018-05-04 DIAGNOSIS — E785 Hyperlipidemia, unspecified: Secondary | ICD-10-CM | POA: Diagnosis present

## 2018-05-04 DIAGNOSIS — I2510 Atherosclerotic heart disease of native coronary artery without angina pectoris: Secondary | ICD-10-CM | POA: Diagnosis present

## 2018-05-04 DIAGNOSIS — Z5331 Laparoscopic surgical procedure converted to open procedure: Secondary | ICD-10-CM | POA: Diagnosis not present

## 2018-05-04 DIAGNOSIS — Z915 Personal history of self-harm: Secondary | ICD-10-CM

## 2018-05-04 DIAGNOSIS — I10 Essential (primary) hypertension: Secondary | ICD-10-CM | POA: Diagnosis present

## 2018-05-04 DIAGNOSIS — K811 Chronic cholecystitis: Principal | ICD-10-CM | POA: Diagnosis present

## 2018-05-04 DIAGNOSIS — F209 Schizophrenia, unspecified: Secondary | ICD-10-CM | POA: Diagnosis present

## 2018-05-04 DIAGNOSIS — K449 Diaphragmatic hernia without obstruction or gangrene: Secondary | ICD-10-CM | POA: Diagnosis present

## 2018-05-04 DIAGNOSIS — Z888 Allergy status to other drugs, medicaments and biological substances status: Secondary | ICD-10-CM

## 2018-05-04 DIAGNOSIS — Z87891 Personal history of nicotine dependence: Secondary | ICD-10-CM

## 2018-05-04 HISTORY — PX: CHOLECYSTECTOMY: SHX55

## 2018-05-04 LAB — CBC
HEMATOCRIT: 31.8 % — AB (ref 40.0–52.0)
HEMOGLOBIN: 10.6 g/dL — AB (ref 13.0–18.0)
MCH: 26.8 pg (ref 26.0–34.0)
MCHC: 33.2 g/dL (ref 32.0–36.0)
MCV: 80.7 fL (ref 80.0–100.0)
Platelets: 337 10*3/uL (ref 150–440)
RBC: 3.94 MIL/uL — ABNORMAL LOW (ref 4.40–5.90)
RDW: 15.3 % — ABNORMAL HIGH (ref 11.5–14.5)
WBC: 9.3 10*3/uL (ref 3.8–10.6)

## 2018-05-04 LAB — URINE DRUG SCREEN, QUALITATIVE (ARMC ONLY)
Amphetamines, Ur Screen: NOT DETECTED
BENZODIAZEPINE, UR SCRN: NOT DETECTED
Barbiturates, Ur Screen: NOT DETECTED
CANNABINOID 50 NG, UR ~~LOC~~: NOT DETECTED
Cocaine Metabolite,Ur ~~LOC~~: NOT DETECTED
MDMA (ECSTASY) UR SCREEN: POSITIVE — AB
Methadone Scn, Ur: NOT DETECTED
OPIATE, UR SCREEN: POSITIVE — AB
PHENCYCLIDINE (PCP) UR S: NOT DETECTED
Tricyclic, Ur Screen: NOT DETECTED

## 2018-05-04 LAB — CREATININE, SERUM
Creatinine, Ser: 1.31 mg/dL — ABNORMAL HIGH (ref 0.61–1.24)
GFR, EST NON AFRICAN AMERICAN: 57 mL/min — AB (ref >60)

## 2018-05-04 SURGERY — LAPAROSCOPIC CHOLECYSTECTOMY
Anesthesia: General

## 2018-05-04 MED ORDER — FENTANYL CITRATE (PF) 100 MCG/2ML IJ SOLN
INTRAMUSCULAR | Status: DC | PRN
  Administered 2018-05-04: 50 ug via INTRAVENOUS
  Administered 2018-05-04: 100 ug via INTRAVENOUS
  Administered 2018-05-04 (×2): 25 ug via INTRAVENOUS

## 2018-05-04 MED ORDER — ROCURONIUM BROMIDE 50 MG/5ML IV SOLN
INTRAVENOUS | Status: AC
  Filled 2018-05-04: qty 1

## 2018-05-04 MED ORDER — BUPIVACAINE LIPOSOME 1.3 % IJ SUSP
INTRAMUSCULAR | Status: AC
  Filled 2018-05-04: qty 20

## 2018-05-04 MED ORDER — PHENYLEPHRINE HCL 10 MG/ML IJ SOLN
INTRAMUSCULAR | Status: AC
  Filled 2018-05-04: qty 1

## 2018-05-04 MED ORDER — FENTANYL CITRATE (PF) 100 MCG/2ML IJ SOLN
INTRAMUSCULAR | Status: AC
  Filled 2018-05-04: qty 2

## 2018-05-04 MED ORDER — CELECOXIB 200 MG PO CAPS
200.0000 mg | ORAL_CAPSULE | ORAL | Status: AC
  Administered 2018-05-04: 200 mg via ORAL

## 2018-05-04 MED ORDER — AMLODIPINE BESYLATE 5 MG PO TABS
5.0000 mg | ORAL_TABLET | Freq: Every day | ORAL | Status: DC
  Administered 2018-05-05 – 2018-05-07 (×3): 5 mg via ORAL
  Filled 2018-05-04 (×4): qty 1

## 2018-05-04 MED ORDER — ACETAMINOPHEN 500 MG PO TABS
ORAL_TABLET | ORAL | Status: AC
  Filled 2018-05-04: qty 2

## 2018-05-04 MED ORDER — HYDROMORPHONE HCL 1 MG/ML IJ SOLN
INTRAMUSCULAR | Status: AC
  Filled 2018-05-04: qty 1

## 2018-05-04 MED ORDER — LACTATED RINGERS IV SOLN
INTRAVENOUS | Status: DC
  Administered 2018-05-04 – 2018-05-05 (×2): via INTRAVENOUS

## 2018-05-04 MED ORDER — LIDOCAINE HCL (PF) 2 % IJ SOLN
INTRAMUSCULAR | Status: AC
  Filled 2018-05-04: qty 10

## 2018-05-04 MED ORDER — LIDOCAINE-EPINEPHRINE (PF) 1 %-1:200000 IJ SOLN
INTRAMUSCULAR | Status: DC | PRN
  Administered 2018-05-04: 12 mL

## 2018-05-04 MED ORDER — NOREPINEPHRINE BITARTRATE 1 MG/ML IV SOLN
INTRAVENOUS | Status: AC
  Filled 2018-05-04: qty 4

## 2018-05-04 MED ORDER — HYDROCODONE-ACETAMINOPHEN 5-325 MG PO TABS
1.0000 | ORAL_TABLET | ORAL | Status: DC | PRN
  Administered 2018-05-04: 2 via ORAL
  Administered 2018-05-05: 1 via ORAL
  Administered 2018-05-05: 2 via ORAL
  Administered 2018-05-05 – 2018-05-07 (×4): 1 via ORAL
  Filled 2018-05-04: qty 2
  Filled 2018-05-04 (×3): qty 1
  Filled 2018-05-04: qty 2
  Filled 2018-05-04 (×2): qty 1

## 2018-05-04 MED ORDER — BUPIVACAINE HCL (PF) 0.25 % IJ SOLN
INTRAMUSCULAR | Status: AC
  Filled 2018-05-04: qty 30

## 2018-05-04 MED ORDER — IBUPROFEN 400 MG PO TABS
600.0000 mg | ORAL_TABLET | Freq: Four times a day (QID) | ORAL | Status: DC | PRN
  Administered 2018-05-05 – 2018-05-06 (×2): 600 mg via ORAL
  Filled 2018-05-04 (×2): qty 2

## 2018-05-04 MED ORDER — CELECOXIB 200 MG PO CAPS
ORAL_CAPSULE | ORAL | Status: AC
  Filled 2018-05-04: qty 1

## 2018-05-04 MED ORDER — ONDANSETRON HCL 4 MG/2ML IJ SOLN
INTRAMUSCULAR | Status: DC | PRN
  Administered 2018-05-04: 4 mg via INTRAVENOUS

## 2018-05-04 MED ORDER — FLUTICASONE PROPIONATE 50 MCG/ACT NA SUSP
2.0000 | Freq: Every day | NASAL | Status: DC
  Administered 2018-05-05 – 2018-05-07 (×3): 2 via NASAL
  Filled 2018-05-04: qty 16

## 2018-05-04 MED ORDER — MOMETASONE FURO-FORMOTEROL FUM 100-5 MCG/ACT IN AERO
2.0000 | INHALATION_SPRAY | Freq: Two times a day (BID) | RESPIRATORY_TRACT | Status: DC
  Administered 2018-05-04 – 2018-05-07 (×6): 2 via RESPIRATORY_TRACT
  Filled 2018-05-04: qty 8.8

## 2018-05-04 MED ORDER — FAMOTIDINE 20 MG PO TABS
20.0000 mg | ORAL_TABLET | Freq: Two times a day (BID) | ORAL | Status: DC
  Administered 2018-05-04 – 2018-05-07 (×6): 20 mg via ORAL
  Filled 2018-05-04 (×6): qty 1

## 2018-05-04 MED ORDER — CHLORHEXIDINE GLUCONATE CLOTH 2 % EX PADS: 6.0000 | MEDICATED_PAD | Freq: Once | CUTANEOUS | Status: DC

## 2018-05-04 MED ORDER — CLOZAPINE 25 MG PO TABS
150.0000 mg | ORAL_TABLET | Freq: Every day | ORAL | Status: DC
  Administered 2018-05-04 – 2018-05-06 (×3): 150 mg via ORAL
  Filled 2018-05-04 (×4): qty 2

## 2018-05-04 MED ORDER — HYDROXYZINE HCL 25 MG PO TABS: 25.0000 mg | ORAL_TABLET | Freq: Two times a day (BID) | ORAL | Status: DC | PRN

## 2018-05-04 MED ORDER — SODIUM CHLORIDE 0.9 % IV SOLN
INTRAVENOUS | Status: DC | PRN
  Administered 2018-05-04: 10 ug/min via INTRAVENOUS

## 2018-05-04 MED ORDER — PROPOFOL 10 MG/ML IV BOLUS
INTRAVENOUS | Status: AC
  Filled 2018-05-04: qty 20

## 2018-05-04 MED ORDER — HYDROXYZINE PAMOATE 25 MG PO CAPS
25.0000 mg | ORAL_CAPSULE | Freq: Two times a day (BID) | ORAL | Status: DC | PRN
  Filled 2018-05-04: qty 1

## 2018-05-04 MED ORDER — ISOSORBIDE MONONITRATE ER 30 MG PO TB24
30.0000 mg | ORAL_TABLET | Freq: Two times a day (BID) | ORAL | Status: DC
  Administered 2018-05-04 – 2018-05-07 (×6): 30 mg via ORAL
  Filled 2018-05-04 (×8): qty 1

## 2018-05-04 MED ORDER — TIMOLOL MALEATE 0.5 % OP SOLN
1.0000 [drp] | Freq: Every day | OPHTHALMIC | Status: DC
  Administered 2018-05-05 – 2018-05-07 (×3): 1 [drp] via OPHTHALMIC
  Filled 2018-05-04: qty 5

## 2018-05-04 MED ORDER — ACETAMINOPHEN 10 MG/ML IV SOLN
INTRAVENOUS | Status: AC
  Filled 2018-05-04: qty 100

## 2018-05-04 MED ORDER — EPINEPHRINE PF 1 MG/10ML IJ SOSY
PREFILLED_SYRINGE | INTRAMUSCULAR | Status: DC | PRN
  Administered 2018-05-04: 20 ug via INTRAVENOUS

## 2018-05-04 MED ORDER — LATANOPROST 0.005 % OP SOLN
1.0000 [drp] | Freq: Every day | OPHTHALMIC | Status: DC
  Administered 2018-05-04 – 2018-05-06 (×3): 1 [drp] via OPHTHALMIC
  Filled 2018-05-04: qty 2.5

## 2018-05-04 MED ORDER — ONDANSETRON 4 MG PO TBDP: 4.0000 mg | ORAL_TABLET | Freq: Four times a day (QID) | ORAL | Status: DC | PRN

## 2018-05-04 MED ORDER — TAMSULOSIN HCL 0.4 MG PO CAPS
0.4000 mg | ORAL_CAPSULE | Freq: Every day | ORAL | Status: DC
  Administered 2018-05-04 – 2018-05-07 (×4): 0.4 mg via ORAL
  Filled 2018-05-04 (×5): qty 1

## 2018-05-04 MED ORDER — ROCURONIUM BROMIDE 100 MG/10ML IV SOLN
INTRAVENOUS | Status: DC | PRN
  Administered 2018-05-04 (×2): 10 mg via INTRAVENOUS
  Administered 2018-05-04: 40 mg via INTRAVENOUS

## 2018-05-04 MED ORDER — BUPIVACAINE HCL (PF) 0.5 % IJ SOLN
INTRAMUSCULAR | Status: AC
  Filled 2018-05-04: qty 30

## 2018-05-04 MED ORDER — MAGNESIUM HYDROXIDE 400 MG/5ML PO SUSP
30.0000 mL | Freq: Every day | ORAL | Status: DC | PRN
  Administered 2018-05-05: 30 mL via ORAL
  Filled 2018-05-04 (×2): qty 30

## 2018-05-04 MED ORDER — ONDANSETRON HCL 4 MG/2ML IJ SOLN
INTRAMUSCULAR | Status: AC
  Filled 2018-05-04: qty 2

## 2018-05-04 MED ORDER — LIDOCAINE-EPINEPHRINE (PF) 1 %-1:200000 IJ SOLN
INTRAMUSCULAR | Status: AC
  Filled 2018-05-04: qty 30

## 2018-05-04 MED ORDER — BUPIVACAINE LIPOSOME 1.3 % IJ SUSP
INTRAMUSCULAR | Status: DC | PRN
  Administered 2018-05-04: 60 mL

## 2018-05-04 MED ORDER — GABAPENTIN 300 MG PO CAPS
300.0000 mg | ORAL_CAPSULE | ORAL | Status: AC
  Administered 2018-05-04: 300 mg via ORAL

## 2018-05-04 MED ORDER — CEFAZOLIN SODIUM-DEXTROSE 2-4 GM/100ML-% IV SOLN
INTRAVENOUS | Status: AC
  Filled 2018-05-04: qty 100

## 2018-05-04 MED ORDER — PROPOFOL 10 MG/ML IV BOLUS
INTRAVENOUS | Status: DC | PRN
  Administered 2018-05-04: 170 mg via INTRAVENOUS

## 2018-05-04 MED ORDER — ACETAMINOPHEN 10 MG/ML IV SOLN
INTRAVENOUS | Status: DC | PRN
  Administered 2018-05-04: 1000 mg via INTRAVENOUS

## 2018-05-04 MED ORDER — SODIUM CHLORIDE FLUSH 0.9 % IV SOLN
INTRAVENOUS | Status: AC
  Filled 2018-05-04: qty 20

## 2018-05-04 MED ORDER — FENTANYL CITRATE (PF) 100 MCG/2ML IJ SOLN
INTRAMUSCULAR | Status: AC
  Administered 2018-05-04: 25 ug via INTRAVENOUS
  Filled 2018-05-04: qty 2

## 2018-05-04 MED ORDER — ONDANSETRON HCL 4 MG/2ML IJ SOLN: 4.0000 mg | Freq: Once | INTRAMUSCULAR | Status: DC | PRN

## 2018-05-04 MED ORDER — BUPROPION HCL ER (SR) 100 MG PO TB12
100.0000 mg | ORAL_TABLET | Freq: Every day | ORAL | Status: DC
  Administered 2018-05-05 – 2018-05-07 (×3): 100 mg via ORAL
  Filled 2018-05-04 (×3): qty 1

## 2018-05-04 MED ORDER — FENTANYL CITRATE (PF) 100 MCG/2ML IJ SOLN
25.0000 ug | INTRAMUSCULAR | Status: AC | PRN
  Administered 2018-05-04 (×6): 25 ug via INTRAVENOUS

## 2018-05-04 MED ORDER — ENOXAPARIN SODIUM 40 MG/0.4ML ~~LOC~~ SOLN
40.0000 mg | SUBCUTANEOUS | Status: DC
  Administered 2018-05-05 – 2018-05-07 (×3): 40 mg via SUBCUTANEOUS
  Filled 2018-05-04 (×3): qty 0.4

## 2018-05-04 MED ORDER — METOPROLOL SUCCINATE ER 25 MG PO TB24
25.0000 mg | ORAL_TABLET | Freq: Every day | ORAL | Status: DC
  Administered 2018-05-05 – 2018-05-07 (×3): 25 mg via ORAL
  Filled 2018-05-04 (×4): qty 1

## 2018-05-04 MED ORDER — VASOPRESSIN 20 UNIT/ML IV SOLN
INTRAVENOUS | Status: DC | PRN
  Administered 2018-05-04 (×4): 2 [IU] via INTRAVENOUS

## 2018-05-04 MED ORDER — SUGAMMADEX SODIUM 200 MG/2ML IV SOLN
INTRAVENOUS | Status: AC
  Filled 2018-05-04: qty 2

## 2018-05-04 MED ORDER — LACTATED RINGERS IV SOLN
INTRAVENOUS | Status: DC
  Administered 2018-05-04 (×2): via INTRAVENOUS

## 2018-05-04 MED ORDER — ACETAMINOPHEN 500 MG PO TABS
1000.0000 mg | ORAL_TABLET | ORAL | Status: AC
  Administered 2018-05-04: 1000 mg via ORAL

## 2018-05-04 MED ORDER — DEXAMETHASONE SODIUM PHOSPHATE 10 MG/ML IJ SOLN
INTRAMUSCULAR | Status: DC | PRN
  Administered 2018-05-04: 6 mg via INTRAVENOUS

## 2018-05-04 MED ORDER — EPHEDRINE SULFATE 50 MG/ML IJ SOLN
INTRAMUSCULAR | Status: DC | PRN
  Administered 2018-05-04 (×3): 10 mg via INTRAVENOUS

## 2018-05-04 MED ORDER — ACETAMINOPHEN 500 MG PO TABS
1000.0000 mg | ORAL_TABLET | Freq: Four times a day (QID) | ORAL | Status: DC
  Administered 2018-05-05 – 2018-05-07 (×6): 1000 mg via ORAL
  Filled 2018-05-04 (×8): qty 2

## 2018-05-04 MED ORDER — DEXAMETHASONE SODIUM PHOSPHATE 10 MG/ML IJ SOLN
INTRAMUSCULAR | Status: AC
  Filled 2018-05-04: qty 1

## 2018-05-04 MED ORDER — PHENYLEPHRINE HCL 10 MG/ML IJ SOLN
INTRAMUSCULAR | Status: DC | PRN
  Administered 2018-05-04: 50 ug via INTRAVENOUS
  Administered 2018-05-04: 100 ug via INTRAVENOUS

## 2018-05-04 MED ORDER — LACTATED RINGERS IV SOLN
INTRAVENOUS | Status: DC | PRN
  Administered 2018-05-04: 17:00:00 via INTRAVENOUS

## 2018-05-04 MED ORDER — CARVEDILOL 25 MG PO TABS
25.0000 mg | ORAL_TABLET | Freq: Two times a day (BID) | ORAL | Status: DC
  Administered 2018-05-05 – 2018-05-07 (×5): 25 mg via ORAL
  Filled 2018-05-04 (×5): qty 1

## 2018-05-04 MED ORDER — FENOFIBRATE 160 MG PO TABS
160.0000 mg | ORAL_TABLET | Freq: Every day | ORAL | Status: DC
  Administered 2018-05-05 – 2018-05-07 (×3): 160 mg via ORAL
  Filled 2018-05-04 (×4): qty 1

## 2018-05-04 MED ORDER — SIMVASTATIN 40 MG PO TABS
40.0000 mg | ORAL_TABLET | Freq: Every day | ORAL | Status: DC
  Administered 2018-05-04 – 2018-05-06 (×3): 40 mg via ORAL
  Filled 2018-05-04 (×4): qty 1

## 2018-05-04 MED ORDER — MORPHINE SULFATE (PF) 2 MG/ML IV SOLN
2.0000 mg | INTRAVENOUS | Status: DC | PRN
  Administered 2018-05-05: 2 mg via INTRAVENOUS
  Filled 2018-05-04: qty 1

## 2018-05-04 MED ORDER — LIDOCAINE HCL (CARDIAC) PF 100 MG/5ML IV SOSY
PREFILLED_SYRINGE | INTRAVENOUS | Status: DC | PRN
  Administered 2018-05-04: 100 mg via INTRAVENOUS

## 2018-05-04 MED ORDER — GABAPENTIN 300 MG PO CAPS
ORAL_CAPSULE | ORAL | Status: AC
  Filled 2018-05-04: qty 1

## 2018-05-04 MED ORDER — SUGAMMADEX SODIUM 200 MG/2ML IV SOLN
INTRAVENOUS | Status: DC | PRN
  Administered 2018-05-04: 180 mg via INTRAVENOUS

## 2018-05-04 MED ORDER — EZETIMIBE 10 MG PO TABS
10.0000 mg | ORAL_TABLET | Freq: Every day | ORAL | Status: DC
  Administered 2018-05-04 – 2018-05-06 (×3): 10 mg via ORAL
  Filled 2018-05-04 (×4): qty 1

## 2018-05-04 SURGICAL SUPPLY — 66 items
APPLICATOR COTTON TIP 6 STRL (MISCELLANEOUS) IMPLANT
APPLICATOR COTTON TIP 6IN STRL (MISCELLANEOUS)
APPLIER CLIP 5 13 M/L LIGAMAX5 (MISCELLANEOUS) ×3
BLADE SURG SZ10 CARB STEEL (BLADE) ×3 IMPLANT
BLADE SURG SZ11 CARB STEEL (BLADE) ×3 IMPLANT
CANISTER SUCT 1200ML W/VALVE (MISCELLANEOUS) ×3 IMPLANT
CHLORAPREP W/TINT 26ML (MISCELLANEOUS) ×3 IMPLANT
CHOLANGIOGRAM CATH TAUT (CATHETERS) IMPLANT
CLIP APPLIE 5 13 M/L LIGAMAX5 (MISCELLANEOUS) ×1 IMPLANT
DECANTER SPIKE VIAL GLASS SM (MISCELLANEOUS) IMPLANT
DEFOGGER SCOPE WARMER CLEARIFY (MISCELLANEOUS) IMPLANT
DERMABOND ADVANCED (GAUZE/BANDAGES/DRESSINGS) ×2
DERMABOND ADVANCED .7 DNX12 (GAUZE/BANDAGES/DRESSINGS) ×1 IMPLANT
DISSECTOR BLUNT TIP ENDO 5MM (MISCELLANEOUS) IMPLANT
DISSECTOR KITTNER STICK (MISCELLANEOUS) ×1 IMPLANT
DISSECTORS/KITTNER STICK (MISCELLANEOUS) ×3
DRAPE C-ARM XRAY 36X54 (DRAPES) IMPLANT
DRAPE SHEET LG 3/4 BI-LAMINATE (DRAPES) IMPLANT
DRSG OPSITE POSTOP 3X4 (GAUZE/BANDAGES/DRESSINGS) ×3 IMPLANT
DRSG OPSITE POSTOP 4X8 (GAUZE/BANDAGES/DRESSINGS) ×3 IMPLANT
ELECT BLADE 6.5 EXT (BLADE) ×3 IMPLANT
ELECT CAUTERY BLADE 6.4 (BLADE) IMPLANT
ELECT REM PT RETURN 9FT ADLT (ELECTROSURGICAL) ×3
ELECTRODE REM PT RTRN 9FT ADLT (ELECTROSURGICAL) ×1 IMPLANT
GLOVE BIOGEL PI IND STRL 7.0 (GLOVE) ×3 IMPLANT
GLOVE BIOGEL PI INDICATOR 7.0 (GLOVE) ×6
GLOVE SURG SYN 7.0 (GLOVE) ×9 IMPLANT
GOWN STRL REUS W/ TWL LRG LVL3 (GOWN DISPOSABLE) ×3 IMPLANT
GOWN STRL REUS W/TWL LRG LVL3 (GOWN DISPOSABLE) ×6
GRASPER SUT TROCAR 14GX15 (MISCELLANEOUS) ×3 IMPLANT
HANDLE YANKAUER SUCT BULB TIP (MISCELLANEOUS) ×3 IMPLANT
IRRIGATION STRYKERFLOW (MISCELLANEOUS) ×1 IMPLANT
IRRIGATOR STRYKERFLOW (MISCELLANEOUS) ×3
IV CATH ANGIO 12GX3 LT BLUE (NEEDLE) IMPLANT
IV NS 1000ML (IV SOLUTION) ×2
IV NS 1000ML BAXH (IV SOLUTION) ×1 IMPLANT
JACKSON PRATT 10 (INSTRUMENTS) ×3 IMPLANT
L-HOOK LAP DISP 36CM (ELECTROSURGICAL) ×3
LHOOK LAP DISP 36CM (ELECTROSURGICAL) ×1 IMPLANT
NEEDLE HYPO 22GX1.5 SAFETY (NEEDLE) ×3 IMPLANT
PACK LAP CHOLECYSTECTOMY (MISCELLANEOUS) ×3 IMPLANT
PENCIL ELECTRO HAND CTR (MISCELLANEOUS) ×3 IMPLANT
PORT ACCESS TROCAR AIRSEAL 5 (TROCAR) ×3 IMPLANT
POUCH SPECIMEN RETRIEVAL 10MM (ENDOMECHANICALS) ×3 IMPLANT
SCISSORS METZENBAUM CVD 33 (INSTRUMENTS) ×3 IMPLANT
SET TRI-LUMEN FLTR TB AIRSEAL (TUBING) ×3 IMPLANT
SLEEVE ENDOPATH XCEL 5M (ENDOMECHANICALS) ×3 IMPLANT
SPONGE DRAIN TRACH 4X4 STRL 2S (GAUZE/BANDAGES/DRESSINGS) ×3 IMPLANT
SPONGE LAP 18X18 RF (DISPOSABLE) IMPLANT
STAPLER PROXIMATE 75MM BLUE (STAPLE) ×3 IMPLANT
STAPLER SKIN PROX 35W (STAPLE) ×3 IMPLANT
STOPCOCK 4 WAY LG BORE MALE ST (IV SETS) IMPLANT
SUT ETHILON 3-0 FS-10 30 BLK (SUTURE) ×3
SUT MNCRL 4-0 (SUTURE) ×2
SUT MNCRL 4-0 27XMFL (SUTURE) ×1
SUT PDS AB 0 CT1 27 (SUTURE) ×6 IMPLANT
SUT VIC AB 3-0 SH 27 (SUTURE)
SUT VIC AB 3-0 SH 27X BRD (SUTURE) IMPLANT
SUT VICRYL 0 AB UR-6 (SUTURE) ×6 IMPLANT
SUTURE EHLN 3-0 FS-10 30 BLK (SUTURE) ×1 IMPLANT
SUTURE MNCRL 4-0 27XMF (SUTURE) ×1 IMPLANT
SYR 20CC LL (SYRINGE) ×3 IMPLANT
TOWEL OR 17X26 4PK STRL BLUE (TOWEL DISPOSABLE) ×3 IMPLANT
TROCAR XCEL BLUNT TIP 100MML (ENDOMECHANICALS) ×3 IMPLANT
TROCAR XCEL NON-BLD 5MMX100MML (ENDOMECHANICALS) ×9 IMPLANT
WATER STERILE IRR 1000ML POUR (IV SOLUTION) ×3 IMPLANT

## 2018-05-04 NOTE — Anesthesia Preprocedure Evaluation (Addendum)
Anesthesia Evaluation  Patient identified by MRN, date of birth, ID band Patient awake    Reviewed: Allergy & Precautions, NPO status , Patient's Chart, lab work & pertinent test results, reviewed documented beta blocker date and time   Airway Mallampati: II  TM Distance: >3 FB     Dental  (+) Chipped, Poor Dentition, Dental Advisory Given, Missing   Pulmonary COPD, former smoker,           Cardiovascular hypertension, Pt. on medications and Pt. on home beta blockers + CAD and + Past MI       Neuro/Psych Seizures -,  PSYCHIATRIC DISORDERS Schizophrenia    GI/Hepatic hiatal hernia, (+)     substance abuse  alcohol use,   Endo/Other  negative endocrine ROS  Renal/GU negative Renal ROS     Musculoskeletal   Abdominal   Peds  Hematology  (+) anemia ,   Anesthesia Other Findings UDS positive for MDMA today and two weeks ago.  Pt takes two medications that can result in false positive MDMA test, fenofibrate and buproprion. Pt insists that he has not used any illicit substances.  After lengthy discussion about risks of anesthesia if MDMA had in fact been ingested, decision was made to proceed.  Past Medical History: No date: Anemia     Comment:  IDA No date: Colon polyps No date: COPD (chronic obstructive pulmonary disease) (HCC) No date: Coronary artery disease 2005: Heart attack (HCC) No date: Hiatal hernia No date: History of ETOH abuse No date: Hyperlipidemia No date: Hypertension No date: Pleurisy No date: Schizophrenia (HCC) No date: Schizophrenia (HCC) No date: Seizures (HCC)     Comment:  grand mal in 2000 or 2001 recovery from alcoholism No date: Suicide attempt [facility](HCC)  Reproductive/Obstetrics                            Anesthesia Physical  Anesthesia Plan  ASA: III  Anesthesia Plan: General   Post-op Pain Management:    Induction: Intravenous  PONV Risk Score and Plan:    Airway Management Planned: Oral ETT  Additional Equipment:   Intra-op Plan:   Post-operative Plan: Extubation in OR  Informed Consent: I have reviewed the patients History and Physical, chart, labs and discussed the procedure including the risks, benefits and alternatives for the proposed anesthesia with the patient or authorized representative who has indicated his/her understanding and acceptance.     Plan Discussed with: CRNA  Anesthesia Plan Comments:         Anesthesia Quick Evaluation

## 2018-05-04 NOTE — Anesthesia Post-op Follow-up Note (Signed)
Anesthesia QCDR form completed.        

## 2018-05-04 NOTE — Progress Notes (Signed)
MEDICATION RELATED CONSULT NOTE - INITIAL   Pharmacy Consult for Clozaril  Indication: Clozaril monitoring   Allergies  Allergen Reactions  . Compazine [Prochlorperazine] Other (See Comments)    Dystonic rxn - convulsions. Near fatal reaction.   Rebekah [place] [Sulfur] Other (See Comments)    History of alcoholism  . Benadryl [Diphenhydramine] Other (See Comments)    "stuffy", nasal congestion  . Depakote [Divalproex Sodium] Other (See Comments)    Cause elevated ammonia  . Dramamine [Dimenhydrinate] Swelling  . Plavix [Clopidogrel] Other (See Comments)    Rectal bleeding. "Perforated my intestines."    Patient Measurements: Height: 6' (182.9 cm) Weight: 194 lb (88 kg) IBW/kg (Calculated) : 77.6 Adjusted Body Weight:   Vital Signs: Temp: 97.4 F (36.3 C) (09/24 2037) Temp Source: Oral (09/24 2037) BP: 104/76 (09/24 2037) Pulse Rate: 76 (09/24 2037) Intake/Output from previous day: No intake/output data recorded. Intake/Output from this shift: Total I/O In: 200 [I.V.:200] Out: 30 [Drains:30]  Labs: Recent Labs    05/04/18 2052  WBC 9.3  HGB 10.6*  HCT 31.8*  PLT 337  CREATININE 1.31*   Estimated Creatinine Clearance: 64.2 mL/min (A) (by C-G formula based on SCr of 1.31 mg/dL (H)).   Microbiology: No results found for this or any previous visit (from the past 720 hour(s)).  Medical History: Past Medical History:  Diagnosis Date  . Anemia    IDA  . Colon polyps   . COPD (chronic obstructive pulmonary disease) (HCC)   . Coronary artery disease   . Heart attack (HCC) 2005  . Hiatal hernia   . History of ETOH abuse   . Hyperlipidemia   . Hypertension   . Pleurisy   . Schizophrenia (HCC)   . Schizophrenia (HCC)   . Seizures (HCC)    grand mal in 2000 or 2001 recovery from alcoholism  . Suicide attempt [facility])     Medications:  Medications Prior to Admission  Medication Sig Dispense Refill Last Dose  . amLODipine (NORVASC) 5 MG tablet Take 5 mg  by mouth daily.   05/04/2018 at Unknown time  . buPROPion (WELLBUTRIN SR) 100 MG 12 hr tablet Take 100 mg by mouth daily.    05/04/2018 at Unknown time  . carvedilol (COREG) 25 MG tablet Take 25 mg by mouth 2 (two) times daily with a meal.   05/04/2018 at Unknown time  . cloZAPine (CLOZARIL) 100 MG tablet Take 150 mg by mouth at bedtime.    05/03/2018 at Unknown time  . docusate sodium (COLACE) 100 MG capsule Take 1 tablet once or twice daily as needed for constipation while taking narcotic pain medicine (Patient taking differently: Take 100 mg by mouth 2 (two) times daily as needed for mild constipation. ) 30 capsule 0 05/03/2018 at Unknown time  . ezetimibe (ZETIA) 10 MG tablet Take 10 mg by mouth at bedtime.    05/03/2018 at Unknown time  . famotidine (PEPCID) 20 MG tablet Take 1 tablet (20 mg total) by mouth 2 (two) times daily. 60 tablet 0 05/04/2018 at Unknown time  . fenofibrate 160 MG tablet Take 160 mg by mouth daily.   05/03/2018 at Unknown time  . fluticasone (FLONASE) 50 MCG/ACT nasal spray Place 2 sprays into both nostrils daily.    05/03/2018 at Unknown time  . HYDROcodone-acetaminophen (NORCO/VICODIN) 5-325 MG tablet Take 1-2 tablets by mouth every 4 (four) hours as needed for moderate pain. 15 tablet 0 05/03/2018 at Unknown time  . hydrOXYzine (VISTARIL) 25 MG capsule Take  25 mg by mouth 2 (two) times daily as needed for anxiety.   05/03/2018 at Unknown time  . isosorbide mononitrate (IMDUR) 30 MG 24 hr tablet Take 30 mg by mouth 2 (two) times daily.    05/04/2018 at Unknown time  . latanoprost (XALATAN) 0.005 % ophthalmic solution Place 1 drop into both eyes at bedtime.   05/03/2018 at Unknown time  . magnesium hydroxide (MILK OF MAGNESIA) 400 MG/5ML suspension Take 30 mLs by mouth daily as needed for moderate constipation.    05/03/2018 at Unknown time  . metoprolol succinate (TOPROL-XL) 25 MG 24 hr tablet Take 25 mg by mouth daily.   05/04/2018 at Unknown time  . nitroGLYCERIN (NITROSTAT) 0.4 MG  SL tablet Place 0.4 mg under the tongue every 5 (five) minutes as needed for chest pain.    05/03/2018 at Unknown time  . ondansetron (ZOFRAN-ODT) 4 MG disintegrating tablet Take 1 tablet (4 mg total) by mouth every 6 (six) hours as needed for nausea. 20 tablet 0 05/03/2018 at Unknown time  . senna (SENOKOT) 8.6 MG tablet Take 1-2 tablets by mouth daily as needed for constipation.   05/03/2018 at Unknown time  . simvastatin (ZOCOR) 40 MG tablet Take 40 mg by mouth at bedtime.    05/03/2018 at Unknown time  . SYMBICORT 80-4.5 MCG/ACT inhaler Inhale 2 puffs into the lungs 2 (two) times daily.   05/04/2018 at Unknown time  . tamsulosin (FLOMAX) 0.4 MG CAPS capsule Take 0.4 mg by mouth daily.    05/03/2018 at Unknown time  . timolol (TIMOPTIC) 0.5 % ophthalmic solution Place 1 drop into both eyes daily.   05/03/2018 at Unknown time    Assessment: ANC:   8/29 = 8700             9/20 = 4300   Goal of Therapy:  Weekly Clozaril monitoring   Plan:  Pt eligible to dispense clozaril ,  Will recheck ANC on 9/27 with AM labs.   Amylynn Fano D 05/04/2018,9:32 PM

## 2018-05-04 NOTE — Progress Notes (Signed)
The on-call chaplain was paged to provide pastoral care to the patient prior to surgery. The page was forwarded to the unit chaplain. Chaplain met with the patient's wife, Burna Sis, in the lobby and was directed to the patient's room. Chaplain encountered patient and offered emotional support and prayer. At the conclusion of the encounter, Chaplain circled back to the wife and let her know that the Chaplain had prayed with her husband.

## 2018-05-04 NOTE — Transfer of Care (Signed)
Immediate Anesthesia Transfer of Care Note  Patient: Brandon RheinRobert S [name]  Procedure(s) Performed: LAPAROSCOPIC CHOLECYSTECTOMY, converted to open (N/A )  Patient Location: PACU  Anesthesia Type:General  Level of Consciousness: awake, alert , oriented and patient cooperative  Airway & Oxygen Therapy: Patient Spontanous Breathing and Patient connected to T-piece oxygen  Post-op Assessment: Report given to RN and Post -op Vital signs reviewed and stable  Post vital signs: Reviewed and stable  Last Vitals:  Vitals Value Taken Time  BP 115/77 05/04/2018  6:53 PM  Temp    Pulse 82 05/04/2018  6:55 PM  Resp 18 05/04/2018  6:55 PM  SpO2 100 % 05/04/2018  6:55 PM  Vitals shown include unvalidated device data.  Last Pain:  Vitals:   05/04/18 1024  TempSrc: Oral  PainSc: 0-No pain         Complications: No apparent anesthesia complications

## 2018-05-04 NOTE — Interval H&P Note (Signed)
History and Physical Interval Note:  05/04/2018 2:49 PM  Brandon [name]  has presented today for surgery, with the diagnosis of CHRONIC CHOLECYSTECTOMY  The various methods of treatment have been discussed with the patient and family. After consideration of risks, benefits and other options for treatment, the patient has consented to  Procedure(s): LAPAROSCOPIC CHOLECYSTECTOMY (N/A) as a surgical intervention .  The patient's history has been reviewed, patient examined, no change in status, stable for surgery.  I have reviewed the patient's chart and labs.  Questions were answered to the patient's satisfaction.    UDS on previous test noted to be positive for MDMA so STAT UDS screen performed and noted to be positive again for MDMA.  Discussed results with patient and he denies use or any possbility of mixing with other drug paraphenalia, for which he also denies use.  Studies have shown false positives in patient's taking bupropion and fenofibrate, and he is taking both.  After extensive discussion with anesthesiologist, patient, and patient's wife, we have decided to proceed with surgery.  Pt and wife both understand any withholding of vital information may lead to severe complications such as seizure, stroke and/or death.  We have all acknowledged said risks, and have decided to proceed     Sung AmabileIsami Sakira Dahmer

## 2018-05-04 NOTE — Anesthesia Procedure Notes (Signed)
Procedure Name: Intubation Performed by: Glendi Mohiuddin, CRNA Pre-anesthesia Checklist: Patient identified, Patient being monitored, Timeout performed, Emergency Drugs available and Suction available Patient Re-evaluated:Patient Re-evaluated prior to induction Oxygen Delivery Method: Circle system utilized Preoxygenation: Pre-oxygenation with 100% oxygen Induction Type: IV induction Ventilation: Mask ventilation without difficulty Laryngoscope Size: Mac and 3 Grade View: Grade I Tube type: Oral Tube size: 7.5 mm Number of attempts: 1 Airway Equipment and Method: Stylet Placement Confirmation: ETT inserted through vocal cords under direct vision,  positive ETCO2 and breath sounds checked- equal and bilateral Secured at: 22 cm Tube secured with: Tape Dental Injury: Teeth and Oropharynx as per pre-operative assessment        

## 2018-05-04 NOTE — Interval H&P Note (Signed)
History and Physical Interval Note:  05/04/2018 11:09 AM  Brandon [name]  has presented today for surgery, with the diagnosis of CHRONIC CHOLECYSTECTOMY  The various methods of treatment have been discussed with the patient and family. After consideration of risks, benefits and other options for treatment, the patient has consented to  Procedure(s): LAPAROSCOPIC CHOLECYSTECTOMY (N/A) as a surgical intervention .  The patient's history has been reviewed, patient examined, no change in status, stable for surgery.  I have reviewed the patient's chart and labs.  Questions were answered to the patient's satisfaction.     Arlan Birks Tonna BoehringerSakai

## 2018-05-04 NOTE — Op Note (Signed)
Preoperative diagnosis:  chronic and cholecystitis  Postoperative diagnosis: same as above  Procedure: Laparoscopic, converted to open subtotal Cholecystectomy.   Anesthesia: GETA   Surgeon: Sung Amabile  Specimen: Gallbladder  Complications: None  EBL: 50mL  Wound Classification: Clean Contaminated  Indications: see HPI  Findings: Extensive gallbladder wall thickening with distortion of anatomy making critical view of safety unobtainable via laparoscopic procedure as well as open procedure.  Please see details below.  Description of procedure: The patient was placed on the operating table in the supine position. SCDs placed, pre-op abx administered.  General anesthesia was induced and OG tube placed by anesthesia. A time-out was completed verifying correct patient, procedure, site, positioning, and implant(s) and/or special equipment prior to beginning this procedure. The abdomen was prepped and draped in the usual sterile fashion.  An incision was made in a natural skin line under the umbilicus.  Dissection carried down to fascia where two 0 vicryl sutures placed to use as anchor sutures for hasson port.  Incision made into fascia and blunt dissection used to enter peritoneum.  Hasson port placed and insufflation started up to 15mm Hg without any dramatic increase in pressure.    The laparoscope was inserted and the abdomen inspected. No injuries from initial trocar placement were noted. Additional trocars were then inserted under direct visualization in the following locations: a 5-mm trocar in the subxyphoid region and two 5-mm trocars along the right costal margin. The abdomen was inspected and no abnormalities or injuries were found. The table was placed in the reverse Trendelenburg position with the right side up.   The gallbladder was noted to have extensive adhesions to the stomach as well as a extremely thick peritoneal wall surrounding the entire area.  Despite meticulous  dissection from multiple angles including a top-down approach a critical view of safety was unattainable.  During part of the dissection brisk pulsatile bleeding was noted that was initially controlled with pressure alone.  Due to the extensive adhesions and this new bleeding episode decision was made to convert to an open procedure to attempt a more safer dissection and possible completion of the procedure.    While pressure was being held to maintain hemorrhage control, the right upper quadrant port sites were connected via one long incision and dissection carried down into the abdominal cavity.  Once abdominal cavity was entered gas insufflation was stopped, and remaining trochars were removed.  While directly visualizing the gallbladder inspection was performed and no further bleeding was noted.  Dissection was continued via an open, top-down approach until what seems to be hepatic duct/arterial structures were noted posterior to the gallbladder.  The cystic duct and hepatic artery were not encountered before visualizing the hepatic ductal structures.  Reexamination of the entire gallbladder fossa and infundibulum again noted extensive inflammation making it impossible to further dissect out possible cystic artery and cystic duct from any angle.  Attempt was made to isolate the ductal structures by making a small cholecystostomy and approaching the ductal structures from within the gallbladder itself after suctioning out the bile but this attempt was also unsuccessful.  Decision was made at this point to complete a subtotal cholecystectomy.    After confirming the 75 mm GIA stapler was well away from the deeper hepatic ductal structures and lying solely on the gallbladder infundibulum a blue load was fired across to complete the subtotal cholecystectomy.  Specimen was removed off operative field pending pathology.  Afterwards the staple line remain intact with no evidence  of leaking bile, or active bleeding.   Area was extensively irrigated and all excess blood and leaked bile from the cholecystostomy was suctioned out completely.  Final examination of all the operative field did not indicate injury to any structures persistent bile leaks and/or hemorrhage.  10 mm JP drain was placed through the far right lateral port that was not connected to the incision prior to starting closure of the fascia.  The deepest fascia was closed with running 0 PDS.  Exparel was then infused into the entire incision area prior to continuing closure of the remaining more superficial fascial layer with running 0 PDS again.  The skin incision was then closed with staples.  JP drain at the far lateral port site was then secured using 3-0 Ethilon.  The umbilical port site was closed using the 0 Vicryl stay sutures and 3-0 Vicryl for the subdermal layer.  Skin incision was then closed with staples all incision sites then dressed with Tegaderm and the drain site was dressed with a drain sponge and paper tape.  The orogastric tube was removed and patient extubated. The patient tolerated the procedure well and was taken to the postanesthesia care unit in stable condition.  All sponge and instrument count correct at end of procedure.

## 2018-05-05 ENCOUNTER — Other Ambulatory Visit: Payer: Self-pay

## 2018-05-05 ENCOUNTER — Encounter: Payer: Self-pay | Admitting: Surgery

## 2018-05-05 LAB — CBC
HCT: 30.3 % — ABNORMAL LOW (ref 40.0–52.0)
Hemoglobin: 10.1 g/dL — ABNORMAL LOW (ref 13.0–18.0)
MCH: 26.7 pg (ref 26.0–34.0)
MCHC: 33.4 g/dL (ref 32.0–36.0)
MCV: 79.8 fL — ABNORMAL LOW (ref 80.0–100.0)
PLATELETS: 315 10*3/uL (ref 150–440)
RBC: 3.8 MIL/uL — ABNORMAL LOW (ref 4.40–5.90)
RDW: 15.1 % — ABNORMAL HIGH (ref 11.5–14.5)
WBC: 8.8 10*3/uL (ref 3.8–10.6)

## 2018-05-05 LAB — MAGNESIUM: MAGNESIUM: 1.9 mg/dL (ref 1.7–2.4)

## 2018-05-05 LAB — BASIC METABOLIC PANEL
Anion gap: 5 (ref 5–15)
BUN: 9 mg/dL (ref 8–23)
CALCIUM: 8.4 mg/dL — AB (ref 8.9–10.3)
CO2: 24 mmol/L (ref 22–32)
CREATININE: 1.14 mg/dL (ref 0.61–1.24)
Chloride: 104 mmol/L (ref 98–111)
GFR calc Af Amer: >60 mL/min (ref >60)
GFR calc non Af Amer: >60 mL/min (ref >60)
GLUCOSE: 121 mg/dL — AB (ref 70–99)
Potassium: 4.1 mmol/L (ref 3.5–5.1)
Sodium: 133 mmol/L — ABNORMAL LOW (ref 135–145)

## 2018-05-05 LAB — PHOSPHORUS: Phosphorus: 4.5 mg/dL (ref 2.5–4.6)

## 2018-05-05 NOTE — Progress Notes (Signed)
Subjective:  CC:  Brandon [name] is a 62 y.o. male  Hospital stay day 1, 1 Day Post-Op lap converted to open subtotal cholecystectomy  HPI: No issues, overnight.  Pain controlled with IV and PO meds. Hungry and will like to try clears.  ROS:  A 5 point review of systems was performed and pertinent positives and negatives noted in HPI.   Objective:      Temp:  [97.4 F (36.3 C)-98.5 F (36.9 C)] 98.3 F (36.8 C) (09/25 1209) Pulse Rate:  [72-83] 73 (09/25 1209) Resp:  [11-20] 16 (09/25 0451) BP: (91-115)/(61-78) 114/74 (09/25 1209) SpO2:  [93 %-100 %] 95 % (09/25 1209)     Height: 6' (182.9 cm) Weight: 88 kg BMI (Calculated): 26.31   Intake/Output this shift:  Total I/O In: 1306.9 [I.V.:1306.9] Out: 730 [Urine:700; Drains:30]  JP with serosanguinous fluid      Constitutional :  alert, cooperative, appears stated age and no distress  Respiratory:  clear to auscultation bilaterally  Cardiovascular:  regular rate and rhythm  Gastrointestinal: soft, non-tender; bowel sounds normal; no masses,  no organomegaly.   Skin: Cool and moist. incision and port site dressings intact,   Psychiatric: Normal affect, non-agitated, not confused       LABS:  CMP Latest Ref Rng & Units 05/05/2018 05/04/2018 04/30/2018  Glucose 70 - 99 mg/dL 161(W) - 960(A)  BUN 8 - 23 mg/dL 9 - 7(L)  Creatinine 5.40 - 1.24 mg/dL 9.81 1.91(Y) 7.82  Sodium 135 - 145 mmol/L 133(L) - 132(L)  Potassium 3.5 - 5.1 mmol/L 4.1 - 4.8  Chloride 98 - 111 mmol/L 104 - 100  CO2 22 - 32 mmol/L 24 - 25  Calcium 8.9 - 10.3 mg/dL 9.5(A) - 9.0  Total Protein 6.5 - 8.1 g/dL - - 6.4(L)  Total Bilirubin 0.3 - 1.2 mg/dL - - 0.3  Alkaline Phos 38 - 126 U/L - - 53  AST 15 - 41 U/L - - 18  ALT 0 - 44 U/L - - 13   CBC Latest Ref Rng & Units 05/05/2018 05/04/2018 04/30/2018  WBC 3.8 - 10.6 K/uL 8.8 9.3 6.3  Hemoglobin 13.0 - 18.0 g/dL 10.1(L) 10.6(L) 10.4(L)  Hematocrit 40.0 - 52.0 % 30.3(L) 31.8(L) 31.2(L)  Platelets 150 -  440 K/uL 315 337 380    RADS: n/a Assessment:   S/p lap converted to open subtotal cholecystectomy for severely inflamed GB.  ADAT.  Will continue to monitor closely for bile leak via JP.  Will likely d/c home with JP tomorrow if tolerating diet and pain controlled with oral meds

## 2018-05-06 LAB — BASIC METABOLIC PANEL
ANION GAP: 6 (ref 5–15)
BUN: 10 mg/dL (ref 8–23)
CALCIUM: 8.6 mg/dL — AB (ref 8.9–10.3)
CO2: 26 mmol/L (ref 22–32)
Chloride: 105 mmol/L (ref 98–111)
Creatinine, Ser: 1.16 mg/dL (ref 0.61–1.24)
Glucose, Bld: 96 mg/dL (ref 70–99)
POTASSIUM: 3.9 mmol/L (ref 3.5–5.1)
Sodium: 137 mmol/L (ref 135–145)

## 2018-05-06 LAB — CBC
HEMATOCRIT: 29.4 % — AB (ref 40.0–52.0)
Hemoglobin: 9.8 g/dL — ABNORMAL LOW (ref 13.0–18.0)
MCH: 26.9 pg (ref 26.0–34.0)
MCHC: 33.4 g/dL (ref 32.0–36.0)
MCV: 80.4 fL (ref 80.0–100.0)
Platelets: 298 10*3/uL (ref 150–440)
RBC: 3.66 MIL/uL — AB (ref 4.40–5.90)
RDW: 15.6 % — AB (ref 11.5–14.5)
WBC: 8.3 10*3/uL (ref 3.8–10.6)

## 2018-05-06 LAB — SURGICAL PATHOLOGY

## 2018-05-06 NOTE — Anesthesia Postprocedure Evaluation (Signed)
Anesthesia Post Note  Patient: CRISTOVAL TEALL  Procedure(s) Performed: LAPAROSCOPIC CHOLECYSTECTOMY, converted to open (N/A )  Patient location during evaluation: PACU Anesthesia Type: General Level of consciousness: awake and alert Pain management: pain level controlled Vital Signs Assessment: post-procedure vital signs reviewed and stable Respiratory status: spontaneous breathing, nonlabored ventilation, respiratory function stable and patient connected to nasal cannula oxygen Cardiovascular status: blood pressure returned to baseline and stable Postop Assessment: no apparent nausea or vomiting Anesthetic complications: no     Last Vitals:  Vitals:   05/05/18 2040 05/06/18 0456  BP: 104/62 113/76  Pulse: 67 69  Resp: 18 20  Temp: 36.8 C 36.9 C  SpO2: 95% 97%    Last Pain:  Vitals:   05/06/18 0642  TempSrc:   PainSc: 5                  Jovita Gamma

## 2018-05-07 LAB — CBC WITH DIFFERENTIAL/PLATELET
Basophils Absolute: 0 10*3/uL (ref 0–0.1)
Basophils Relative: 1 %
EOS PCT: 3 %
Eosinophils Absolute: 0.2 10*3/uL (ref 0–0.7)
HCT: 30.9 % — ABNORMAL LOW (ref 40.0–52.0)
Hemoglobin: 10.3 g/dL — ABNORMAL LOW (ref 13.0–18.0)
LYMPHS PCT: 17 %
Lymphs Abs: 1.4 10*3/uL (ref 1.0–3.6)
MCH: 26.9 pg (ref 26.0–34.0)
MCHC: 33.3 g/dL (ref 32.0–36.0)
MCV: 80.9 fL (ref 80.0–100.0)
Monocytes Absolute: 0.7 10*3/uL (ref 0.2–1.0)
Monocytes Relative: 8 %
Neutro Abs: 6 10*3/uL (ref 1.4–6.5)
Neutrophils Relative %: 71 %
PLATELETS: 306 10*3/uL (ref 150–440)
RBC: 3.82 MIL/uL — AB (ref 4.40–5.90)
RDW: 16 % — ABNORMAL HIGH (ref 11.5–14.5)
WBC: 8.4 10*3/uL (ref 3.8–10.6)

## 2018-05-07 MED ORDER — ACETAMINOPHEN 325 MG PO TABS: 650.0000 mg | ORAL_TABLET | Freq: Three times a day (TID) | ORAL | 0 refills | Status: AC | PRN

## 2018-05-07 MED ORDER — DOCUSATE SODIUM 100 MG PO CAPS: 100.0000 mg | ORAL_CAPSULE | Freq: Two times a day (BID) | ORAL | 0 refills | Status: AC | PRN

## 2018-05-07 MED ORDER — IBUPROFEN 800 MG PO TABS: 800.0000 mg | ORAL_TABLET | Freq: Three times a day (TID) | ORAL | 1 refills | Status: DC | PRN

## 2018-05-07 MED ORDER — HYDROCODONE-ACETAMINOPHEN 5-325 MG PO TABS: 1.0000 | ORAL_TABLET | ORAL | 0 refills | Status: AC | PRN

## 2018-05-07 MED ORDER — HYDROCODONE-ACETAMINOPHEN 5-325 MG PO TABS: 1.0000 | ORAL_TABLET | ORAL | 0 refills | Status: DC | PRN

## 2018-05-07 NOTE — Progress Notes (Signed)
   05/07/18 1000  Clinical Encounter Type  Visited With Patient;Family  Visit Type Follow-up;Spiritual support  Referral From Family  Recommendations Follow-up as requested.  Spiritual Encounters  Spiritual Needs Emotional   Chaplain met wife, Burna Sis, and received an update. Chaplain met with the patient and offered emotional support.

## 2018-05-07 NOTE — Progress Notes (Signed)
MEDICATION RELATED CONSULT NOTE - INITIAL   Pharmacy Consult for Clozaril  Indication: Clozaril monitoring   Vital Signs: Temp: 97.5 F (36.4 C) (09/27 0544) Temp Source: Oral (09/27 0544) BP: 118/86 (09/27 0838) Pulse Rate: 70 (09/27 0838)  Labs: Recent Labs    05/04/18 2052 05/05/18 0543 05/06/18 0503 05/07/18 0946  WBC 9.3 8.8 8.3 8.4  HGB 10.6* 10.1* 9.8* 10.3*  HCT 31.8* 30.3* 29.4* 30.9*  PLT 337 315 298 306  CREATININE 1.31* 1.14 1.16  --   MG  --  1.9  --   --   PHOS  --  4.5  --   --    Estimated Creatinine Clearance: 72.5 mL/min (by C-G formula based on SCr of 1.16 mg/dL).   Microbiology: No results found for this or any previous visit (from the past 720 hour(s)).  Medical History: Past Medical History:  Diagnosis Date  . Anemia    IDA  . Colon polyps   . COPD (chronic obstructive pulmonary disease) (HCC)   . Coronary artery disease   . Heart attack (HCC) 2005  . Hiatal hernia   . History of ETOH abuse   . Hyperlipidemia   . Hypertension   . Pleurisy   . Schizophrenia (HCC)   . Schizophrenia (HCC)   . Seizures (HCC)    grand mal in 2000 or 2001 recovery from alcoholism  . Suicide attempt [facility])     Medications:  Medications Prior to Admission  Medication Sig Dispense Refill Last Dose  . amLODipine (NORVASC) 5 MG tablet Take 5 mg by mouth daily.   05/04/2018 at Unknown time  . buPROPion (WELLBUTRIN SR) 100 MG 12 hr tablet Take 100 mg by mouth daily.    05/04/2018 at Unknown time  . carvedilol (COREG) 25 MG tablet Take 25 mg by mouth 2 (two) times daily with a meal.   05/04/2018 at Unknown time  . cloZAPine (CLOZARIL) 100 MG tablet Take 150 mg by mouth at bedtime.    05/03/2018 at Unknown time  . docusate sodium (COLACE) 100 MG capsule Take 1 tablet once or twice daily as needed for constipation while taking narcotic pain medicine (Patient taking differently: Take 100 mg by mouth 2 (two) times daily as needed for mild constipation. ) 30 capsule 0  05/03/2018 at Unknown time  . ezetimibe (ZETIA) 10 MG tablet Take 10 mg by mouth at bedtime.    05/03/2018 at Unknown time  . famotidine (PEPCID) 20 MG tablet Take 1 tablet (20 mg total) by mouth 2 (two) times daily. 60 tablet 0 05/04/2018 at Unknown time  . fenofibrate 160 MG tablet Take 160 mg by mouth daily.   05/03/2018 at Unknown time  . fluticasone (FLONASE) 50 MCG/ACT nasal spray Place 2 sprays into both nostrils daily.    05/03/2018 at Unknown time  . hydrOXYzine (VISTARIL) 25 MG capsule Take 25 mg by mouth 2 (two) times daily as needed for anxiety.   05/03/2018 at Unknown time  . isosorbide mononitrate (IMDUR) 30 MG 24 hr tablet Take 30 mg by mouth 2 (two) times daily.    05/04/2018 at Unknown time  . latanoprost (XALATAN) 0.005 % ophthalmic solution Place 1 drop into both eyes at bedtime.   05/03/2018 at Unknown time  . magnesium hydroxide (MILK OF MAGNESIA) 400 MG/5ML suspension Take 30 mLs by mouth daily as needed for moderate constipation.    05/03/2018 at Unknown time  . metoprolol succinate (TOPROL-XL) 25 MG 24 hr tablet Take 25 mg by  mouth daily.   05/04/2018 at Unknown time  . nitroGLYCERIN (NITROSTAT) 0.4 MG SL tablet Place 0.4 mg under the tongue every 5 (five) minutes as needed for chest pain.    05/03/2018 at Unknown time  . ondansetron (ZOFRAN-ODT) 4 MG disintegrating tablet Take 1 tablet (4 mg total) by mouth every 6 (six) hours as needed for nausea. 20 tablet 0 05/03/2018 at Unknown time  . senna (SENOKOT) 8.6 MG tablet Take 1-2 tablets by mouth daily as needed for constipation.   05/03/2018 at Unknown time  . simvastatin (ZOCOR) 40 MG tablet Take 40 mg by mouth at bedtime.    05/03/2018 at Unknown time  . SYMBICORT 80-4.5 MCG/ACT inhaler Inhale 2 puffs into the lungs 2 (two) times daily.   05/04/2018 at Unknown time  . tamsulosin (FLOMAX) 0.4 MG CAPS capsule Take 0.4 mg by mouth daily.    05/03/2018 at Unknown time  . timolol (TIMOPTIC) 0.5 % ophthalmic solution Place 1 drop into both eyes  daily.   05/03/2018 at Unknown time  . [DISCONTINUED] HYDROcodone-acetaminophen (NORCO/VICODIN) 5-325 MG tablet Take 1-2 tablets by mouth every 4 (four) hours as needed for moderate pain. 15 tablet 0 05/03/2018 at Unknown time    Assessment: ANC:   8/29 = 8700             9/20 = 4300   9/27 = 6000  Goal of Therapy:  Weekly Clozaril monitoring   Plan:  Pt eligible to dispense clozaril ,  Will recheck ANC on 10/4 with AM labs if he his still here. He is scheduled for discharge today  Lowella Bandy, PharmD 05/07/2018,12:10 PM

## 2018-05-07 NOTE — Discharge Instructions (Signed)
-   tylenol and advil as needed for discomfort.  Please alternate between the two every four hours as needed for pain.   °- Use narcotics, if prescribed, only when tylenol and motrin is not enough to control pain. °- 325-650mg every 8hrs to max of 4000mg/24hrs (including the 325mg in every norco dose) for the tylenol.   °- Advil up to 800mg per dose every 8hrs as needed for pain.   ° °

## 2018-05-07 NOTE — Discharge Summary (Signed)
Physician Discharge Summary  Patient ID: Brandon [name] MRN: [id number] DOB/AGE: 03/07/1956 62 y.o.  Admit date: 05/04/2018 Discharge date: 05/07/2018  Admission Diagnoses: chronic cholecystitis  Discharge Diagnoses:  Same as above  Discharged Condition: good  Hospital Course: Patient admitted for elective lap scopic cholecystectomy for chronic cholecystitis.  Please see op note for details requiring conversion to an open cholecystectomy.  Afterwards due to pain control and monitoring patient was admitted.  Postoperatively patient recovered well with gradual resume of diet, pain control from IV narcotics to oral narcotics, and the JP drainage persistently being serosanguineous fluid with no evidence of bile leak.  Labs remained stable as well.  At the time of discharge patient was tolerating a diet voiding appropriately incisions clean dry and intact and JP with minimal serosanguineous drainage.  JP was removed prior to discharge.  He will follow-up in 1 week for staple removal  Consults: None  Discharge Exam: Blood pressure 118/86, pulse 70, temperature (!) 97.5 F (36.4 C), temperature source Oral, resp. rate 17, height 6' (1.829 m), weight 88 kg, SpO2 96 %. General appearance: alert, cooperative and no distress GI: minimally tender along incision site, JP with serosanguinous drainage, removed at bedside with no issues.  One nylon suture used to secure the JP drain was still in place to keep the port site halfway closed.  Disposition:  Discharge disposition: 01-Home or Self Care       Discharge Instructions    Discharge patient   Complete by:  As directed    Discharge disposition:  01-Home or Self Care   Discharge patient date:  05/07/2018     Allergies as of 05/07/2018      Reactions   Compazine [prochlorperazine] Other (See Comments)   Dystonic rxn - convulsions. Near fatal reaction.   Alcohol-sulfur [sulfur] Other (See Comments)   History of alcoholism   Benadryl  [diphenhydramine] Other (See Comments)   "stuffy", nasal congestion   Depakote [divalproex Sodium] Other (See Comments)   Cause elevated ammonia   Dramamine [dimenhydrinate] Swelling   Plavix [clopidogrel] Other (See Comments)   Rectal bleeding. "Perforated my intestines."      Medication List    TAKE these medications   acetaminophen 325 MG tablet Commonly known as:  TYLENOL Take 2 tablets (650 mg total) by mouth every 8 (eight) hours as needed for mild pain.   amLODipine 5 MG tablet Commonly known as:  NORVASC Take 5 mg by mouth daily.   buPROPion 100 MG 12 hr tablet Commonly known as:  WELLBUTRIN SR Take 100 mg by mouth daily.   carvedilol 25 MG tablet Commonly known as:  COREG Take 25 mg by mouth 2 (two) times daily with a meal.   cloZAPine 100 MG tablet Commonly known as:  CLOZARIL Take 150 mg by mouth at bedtime.   docusate sodium 100 MG capsule Commonly known as:  COLACE Take 1 tablet once or twice daily as needed for constipation while taking narcotic pain medicine What changed:    how much to take  how to take this  when to take this  reasons to take this  additional instructions   docusate sodium 100 MG capsule Commonly known as:  COLACE Take 1 capsule (100 mg total) by mouth 2 (two) times daily as needed for up to 10 days for mild constipation. What changed:  You were already taking a medication with the same name, and this prescription was added. Make sure you understand how and when to take each.  ezetimibe 10 MG tablet Commonly known as:  ZETIA Take 10 mg by mouth at bedtime.   famotidine 20 MG tablet Commonly known as:  PEPCID Take 1 tablet (20 mg total) by mouth 2 (two) times daily.   fenofibrate 160 MG tablet Take 160 mg by mouth daily.   fluticasone 50 MCG/ACT nasal spray Commonly known as:  FLONASE Place 2 sprays into both nostrils daily.   HYDROcodone-acetaminophen 5-325 MG tablet Commonly known as:  NORCO/VICODIN Take 1-2  tablets by mouth every 4 (four) hours as needed for up to 5 days for moderate pain.   hydrOXYzine 25 MG capsule Commonly known as:  VISTARIL Take 25 mg by mouth 2 (two) times daily as needed for anxiety.   ibuprofen 800 MG tablet Commonly known as:  ADVIL,MOTRIN Take 1 tablet (800 mg total) by mouth every 8 (eight) hours as needed for mild pain or moderate pain (for mild pain not relieved by other medications.).   isosorbide mononitrate 30 MG 24 hr tablet Commonly known as:  IMDUR Take 30 mg by mouth 2 (two) times daily.   latanoprost 0.005 % ophthalmic solution Commonly known as:  XALATAN Place 1 drop into both eyes at bedtime.   magnesium hydroxide 400 MG/5ML suspension Commonly known as:  MILK OF MAGNESIA Take 30 mLs by mouth daily as needed for moderate constipation.   metoprolol succinate 25 MG 24 hr tablet Commonly known as:  TOPROL-XL Take 25 mg by mouth daily.   nitroGLYCERIN 0.4 MG SL tablet Commonly known as:  NITROSTAT Place 0.4 mg under the tongue every 5 (five) minutes as needed for chest pain.   ondansetron 4 MG disintegrating tablet Commonly known as:  ZOFRAN-ODT Take 1 tablet (4 mg total) by mouth every 6 (six) hours as needed for nausea.   SENOKOT 8.6 MG tablet Generic drug:  senna Take 1-2 tablets by mouth daily as needed for constipation.   simvastatin 40 MG tablet Commonly known as:  ZOCOR Take 40 mg by mouth at bedtime.   SYMBICORT 80-4.5 MCG/ACT inhaler Generic drug:  budesonide-formoterol Inhale 2 puffs into the lungs 2 (two) times daily.   tamsulosin 0.4 MG Caps capsule Commonly known as:  FLOMAX Take 0.4 mg by mouth daily.   timolol 0.5 % ophthalmic solution Commonly known as:  TIMOPTIC Place 1 drop into both eyes daily.      Follow-up Information    Ashawn Rinehart, DO Follow up in 1 week(s).   Specialty:  Surgery Contact information: [address] [place] [address] [phone]            Total time spent arranging  discharge was >86min. Signed: Sung Amabile 05/07/2018, 11:10 AM

## 2018-05-07 NOTE — Care Management Important Message (Signed)
Important Message  Patient Details  Name: Brandon [name] MRN: [id number] Date of Birth: May 18, 1956   Medicare Important Message Given:  Yes    Olegario Messier A Rony Ratz 05/07/2018, 12:20 PM

## 2018-05-07 NOTE — Progress Notes (Signed)
Discharge order received. Patient is alert and oriented. Vital signs stable . No signs of acute distress. Discharge instructions given. Patient verbalized understanding. No other issues noted at this time.   

## 2018-05-24 ENCOUNTER — Ambulatory Visit (INDEPENDENT_AMBULATORY_CARE_PROVIDER_SITE_OTHER): Payer: Medicare Other | Admitting: Vascular Surgery

## 2018-05-24 ENCOUNTER — Encounter (INDEPENDENT_AMBULATORY_CARE_PROVIDER_SITE_OTHER): Payer: Medicare Other

## 2018-06-05 ENCOUNTER — Encounter: Payer: Self-pay | Admitting: Emergency Medicine

## 2018-06-05 ENCOUNTER — Ambulatory Visit (HOSPITAL_COMMUNITY)
Admission: AD | Admit: 2018-06-05 | Discharge: 2018-06-05 | Disposition: A | Discharge status: Home / Self Care | Payer: Medicare Other | Source: Other Acute Inpatient Hospital | Attending: Emergency Medicine | Admitting: Emergency Medicine

## 2018-06-05 ENCOUNTER — Emergency Department
Admission: EM | Admit: 2018-06-05 | Discharge: 2018-06-05 | Disposition: A | Discharge status: Other Acute Inpatient Hospital | Payer: Medicare Other | Attending: Emergency Medicine | Admitting: Emergency Medicine

## 2018-06-05 ENCOUNTER — Other Ambulatory Visit: Payer: Self-pay

## 2018-06-05 ENCOUNTER — Emergency Department: Payer: Medicare Other

## 2018-06-05 DIAGNOSIS — I10 Essential (primary) hypertension: Secondary | ICD-10-CM | POA: Diagnosis not present

## 2018-06-05 DIAGNOSIS — Z87891 Personal history of nicotine dependence: Secondary | ICD-10-CM | POA: Insufficient documentation

## 2018-06-05 DIAGNOSIS — Z9049 Acquired absence of other specified parts of digestive tract: Secondary | ICD-10-CM | POA: Diagnosis not present

## 2018-06-05 DIAGNOSIS — R1011 Right upper quadrant pain: Secondary | ICD-10-CM

## 2018-06-05 DIAGNOSIS — R1084 Generalized abdominal pain: Secondary | ICD-10-CM | POA: Diagnosis not present

## 2018-06-05 DIAGNOSIS — Z79899 Other long term (current) drug therapy: Secondary | ICD-10-CM | POA: Diagnosis not present

## 2018-06-05 DIAGNOSIS — Z955 Presence of coronary angioplasty implant and graft: Secondary | ICD-10-CM | POA: Diagnosis not present

## 2018-06-05 DIAGNOSIS — I2510 Atherosclerotic heart disease of native coronary artery without angina pectoris: Secondary | ICD-10-CM | POA: Diagnosis not present

## 2018-06-05 DIAGNOSIS — R112 Nausea with vomiting, unspecified: Secondary | ICD-10-CM

## 2018-06-05 DIAGNOSIS — K9189 Other postprocedural complications and disorders of digestive system: Secondary | ICD-10-CM | POA: Diagnosis present

## 2018-06-05 DIAGNOSIS — R1110 Vomiting, unspecified: Secondary | ICD-10-CM | POA: Diagnosis present

## 2018-06-05 DIAGNOSIS — J449 Chronic obstructive pulmonary disease, unspecified: Secondary | ICD-10-CM | POA: Diagnosis not present

## 2018-06-05 DIAGNOSIS — F209 Schizophrenia, unspecified: Secondary | ICD-10-CM | POA: Diagnosis not present

## 2018-06-05 DIAGNOSIS — K265 Chronic or unspecified duodenal ulcer with perforation: Secondary | ICD-10-CM

## 2018-06-05 LAB — COMPREHENSIVE METABOLIC PANEL
ALT: 14 U/L (ref 0–44)
AST: 18 U/L (ref 15–41)
Albumin: 4.3 g/dL (ref 3.5–5.0)
Alkaline Phosphatase: 57 U/L (ref 38–126)
Anion gap: 12 (ref 5–15)
BUN: 9 mg/dL (ref 8–23)
CHLORIDE: 93 mmol/L — AB (ref 98–111)
CO2: 26 mmol/L (ref 22–32)
Calcium: 10.1 mg/dL (ref 8.9–10.3)
Creatinine, Ser: 1.52 mg/dL — ABNORMAL HIGH (ref 0.61–1.24)
GFR calc Af Amer: 55 mL/min — ABNORMAL LOW (ref >60)
GFR, EST NON AFRICAN AMERICAN: 47 mL/min — AB (ref >60)
Glucose, Bld: 126 mg/dL — ABNORMAL HIGH (ref 70–99)
POTASSIUM: 3.8 mmol/L (ref 3.5–5.1)
SODIUM: 131 mmol/L — AB (ref 135–145)
Total Bilirubin: 0.8 mg/dL (ref 0.3–1.2)
Total Protein: 7.6 g/dL (ref 6.5–8.1)

## 2018-06-05 LAB — CBC WITH DIFFERENTIAL/PLATELET
ABS IMMATURE GRANULOCYTES: 0.07 10*3/uL (ref 0.00–0.07)
BASOS ABS: 0.1 10*3/uL (ref 0.0–0.1)
Basophils Relative: 1 %
Eosinophils Absolute: 0.1 10*3/uL (ref 0.0–0.5)
Eosinophils Relative: 0 %
HCT: 39.3 % (ref 39.0–52.0)
Hemoglobin: 12.6 g/dL — ABNORMAL LOW (ref 13.0–17.0)
Immature Granulocytes: 1 %
LYMPHS PCT: 11 %
Lymphs Abs: 1.5 10*3/uL (ref 0.7–4.0)
MCH: 26.4 pg (ref 26.0–34.0)
MCHC: 32.1 g/dL (ref 30.0–36.0)
MCV: 82.4 fL (ref 80.0–100.0)
MONO ABS: 1.2 10*3/uL — AB (ref 0.1–1.0)
Monocytes Relative: 9 %
NEUTROS ABS: 11.1 10*3/uL — AB (ref 1.7–7.7)
NRBC: 0 % (ref 0.0–0.2)
Neutrophils Relative %: 78 %
Platelets: 390 10*3/uL (ref 150–400)
RBC: 4.77 MIL/uL (ref 4.22–5.81)
RDW: 14.9 % (ref 11.5–15.5)
WBC: 14.1 10*3/uL — ABNORMAL HIGH (ref 4.0–10.5)

## 2018-06-05 LAB — TROPONIN I

## 2018-06-05 MED ORDER — MORPHINE SULFATE (PF) 4 MG/ML IV SOLN
4.0000 mg | Freq: Once | INTRAVENOUS | Status: AC
  Administered 2018-06-05: 4 mg via INTRAVENOUS
  Filled 2018-06-05: qty 1

## 2018-06-05 MED ORDER — DIPHENHYDRAMINE HCL 25 MG PO CAPS: 50.0000 mg | ORAL_CAPSULE | Freq: Once | ORAL | Status: AC

## 2018-06-05 MED ORDER — HYDROCORTISONE NA SUCCINATE PF 250 MG IJ SOLR
200.0000 mg | Freq: Once | INTRAMUSCULAR | Status: AC
  Administered 2018-06-05: 200 mg via INTRAVENOUS
  Filled 2018-06-05: qty 200

## 2018-06-05 MED ORDER — ONDANSETRON HCL 4 MG/2ML IJ SOLN
4.0000 mg | Freq: Once | INTRAMUSCULAR | Status: AC
  Administered 2018-06-05: 4 mg via INTRAVENOUS
  Filled 2018-06-05: qty 2

## 2018-06-05 MED ORDER — IOPAMIDOL (ISOVUE-300) INJECTION 61%
100.0000 mL | Freq: Once | INTRAVENOUS | Status: AC | PRN
  Administered 2018-06-05: 100 mL via INTRAVENOUS

## 2018-06-05 MED ORDER — IOPAMIDOL (ISOVUE-300) INJECTION 61%: 15.0000 mL | INTRAVENOUS | Status: DC

## 2018-06-05 MED ORDER — DIPHENHYDRAMINE HCL 50 MG/ML IJ SOLN
50.0000 mg | Freq: Once | INTRAMUSCULAR | Status: AC
  Administered 2018-06-05: 50 mg via INTRAVENOUS
  Filled 2018-06-05: qty 1

## 2018-06-05 MED ORDER — PIPERACILLIN-TAZOBACTAM 3.375 G IVPB 30 MIN
3.3750 g | Freq: Once | INTRAVENOUS | Status: AC
  Administered 2018-06-05: 3.375 g via INTRAVENOUS
  Filled 2018-06-05: qty 50

## 2018-06-05 MED ORDER — SODIUM CHLORIDE 0.9 % IV BOLUS
1000.0000 mL | Freq: Once | INTRAVENOUS | Status: AC
  Administered 2018-06-05: 1000 mL via INTRAVENOUS

## 2018-06-05 MED ORDER — IOHEXOL 300 MG/ML  SOLN: 30.0000 mL | Freq: Once | INTRAMUSCULAR | Status: DC | PRN

## 2018-06-05 MED ORDER — IOHEXOL 300 MG/ML  SOLN
30.0000 mL | Freq: Once | INTRAMUSCULAR | Status: AC | PRN
  Administered 2018-06-05: 30 mL via ORAL

## 2018-06-05 NOTE — ED Notes (Signed)
Called pharmacy for ordered Solu-Cortef. JB in pharmacy acknowledged request and stated approx 10 min turn around for delivery.

## 2018-06-05 NOTE — ED Notes (Signed)
Signature for transfer was printing and signed by the patient.

## 2018-06-05 NOTE — ED Provider Notes (Signed)
-----------------------------------------   11:01 PM on 06/05/2018 -----------------------------------------   Blood pressure 126/89, pulse 84, temperature 98.4 F (36.9 C), temperature source Oral, resp. rate 17, height 6' (1.829 m), weight 89.8 kg, SpO2 95 %.  Assuming care from Dr. Shaune Pollack of [facility] is a 62 y.o. male with a chief complaint of Post-op Problem .    Please refer to H&P by previous MD for further details.  Patient had to undergo emergent preparation to receive CT with contrast per recommendation of radiologist due to abnormal findings but yet limited of CT without contrast.  CT is now back concerning for a perforated duodenal ulcer.  I discussed with Dr. Aleen Campi from surgery who does not feel comfortable admitting patient as he might need a much larger surgery that he is able to provide at this hospital.  There are no signs of sepsis.  Patient was given Zosyn.  I will consult [facility] for transfer.    _________________________ 12:05 AM on 06/06/2018 -----------------------------------------  Patient accepted for ED to ED transfer by Dr. Loletta Parish and left our ED in stable conditions      Don Perking, [place], MD 06/06/18 0005

## 2018-06-05 NOTE — ED Provider Notes (Signed)
[facility] Emergency Department Provider Note ____________________________________________   I have reviewed the triage vital signs and the triage nursing note.  HISTORY  Chief Complaint Post-op Problem   Historian Patient  HPI Brandon [name] is a 62 y.o. male presents to the ED for vomiting for 24 hours and mid abdominal pain.  Pain is fairly diffuse although mostly located in the mid abdomen.  Does not think he had a fever.  No bloody emesis.'s been watery and numerous.  No diarrhea.  No chest pain or trouble breathing.   Pain is moderate.  Nothing makes it worse or better.  He had a cholecystectomy due to chronic cholecystitis almost a month ago at this point.   Past Medical History:  Diagnosis Date  . Anemia    IDA  . Colon polyps   . COPD (chronic obstructive pulmonary disease) (HCC)   . Coronary artery disease   . Heart attack (HCC) 2005  . Hiatal hernia   . History of ETOH abuse   . Hyperlipidemia   . Hypertension   . Pleurisy   . Schizophrenia (HCC)   . Schizophrenia (HCC)   . Seizures (HCC)    grand mal in 2000 or 2001 recovery from alcoholism  . Suicide attempt [facility])     Patient Active Problem List   Diagnosis Date Noted  . Acute cholecystitis 04/10/2018  . Chronic cholecystitis 04/08/2018  . Atherosclerosis of native arteries of extremity with intermittent claudication (HCC) 11/22/2017  . Essential hypertension 11/22/2017  . Hyperlipidemia 11/22/2017  . COPD (chronic obstructive pulmonary disease) (HCC) 11/22/2017  . CAD (coronary artery disease) 10/15/2017    Past Surgical History:  Procedure Laterality Date  . ABDOMINAL SURGERY     internal bleeding  . CHOLECYSTECTOMY N/A 05/04/2018   Procedure: LAPAROSCOPIC CHOLECYSTECTOMY, converted to open;  Surgeon: Sung Amabile, DO;  Location: [facility] ORS;  Service: General;  Laterality: N/A;  . COLONOSCOPY    . COLONOSCOPY WITH PROPOFOL N/A 05/09/2016   Procedure: COLONOSCOPY WITH  PROPOFOL;  Surgeon: Scot Jun, MD;  Location: [facility] ENDOSCOPY;  Service: Endoscopy;  Laterality: N/A;  . CORONARY ANGIOPLASTY WITH STENT PLACEMENT     1 vessel  . ESOPHAGOGASTRODUODENOSCOPY (EGD) WITH PROPOFOL N/A 05/09/2016   Procedure: ESOPHAGOGASTRODUODENOSCOPY (EGD) WITH PROPOFOL;  Surgeon: Scot Jun, MD;  Location: [facility] ENDOSCOPY;  Service: Endoscopy;  Laterality: N/A;  . EYE SURGERY Right   . GLAUCOMA SURGERY    . LEFT HEART CATH Right 10/19/2017   Procedure: Left Heart Cath and Coronary Angiography;  Surgeon: Laurier Nancy, MD;  Location: [facility] INVASIVE CV LAB;  Service: Cardiovascular;  Laterality: Right;  . NOSE SURGERY    . 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 VASECTOMY      Prior to Admission medications   Medication Sig Start Date End Date Taking? Authorizing Provider  acetaminophen (TYLENOL) 325 MG tablet Take 2 tablets (650 mg total) by mouth every 8 (eight) hours as needed for mild pain. 05/07/18 06/06/18  Tonna Boehringer, Isami, DO  amLODipine (NORVASC) 5 MG tablet Take 5 mg by mouth daily.    [provider]  buPROPion (WELLBUTRIN SR) 100 MG 12 hr tablet Take 100 mg by mouth daily.     [provider]  carvedilol (COREG) 25 MG tablet Take 25 mg by mouth 2 (two) times daily with a meal.    [provider]  cloZAPine (CLOZARIL) 100 MG tablet Take 150 mg by mouth at bedtime.  [provider]  docusate sodium (COLACE) 100 MG capsule Take 1 tablet once or twice daily as needed for constipation while taking narcotic pain medicine Patient taking differently: Take 100 mg by mouth 2 (two) times daily as needed for mild constipation.  04/23/18   Loleta Rose, MD  ezetimibe (ZETIA) 10 MG tablet Take 10 mg by mouth at bedtime.     [provider]  famotidine (PEPCID) 20 MG tablet Take 1 tablet (20 mg total) by mouth 2 (two) times daily. 04/08/18   Sharman Cheek, MD  fenofibrate 160 MG tablet Take 160 mg by mouth daily.    [provider]  fluticasone (FLONASE) 50 MCG/ACT nasal spray Place 2 sprays into both nostrils daily.     [provider]  hydrOXYzine (VISTARIL) 25 MG capsule Take 25 mg by mouth 2 (two) times daily as needed for anxiety.    [provider]  ibuprofen (ADVIL,MOTRIN) 800 MG tablet Take 1 tablet (800 mg total) by mouth every 8 (eight) hours as needed for mild pain or moderate pain (for mild pain not relieved by other medications.). 05/07/18   Tonna Boehringer, Isami, DO  isosorbide mononitrate (IMDUR) 30 MG 24 hr tablet Take 30 mg by mouth 2 (two) times daily.     [provider]  latanoprost (XALATAN) 0.005 % ophthalmic solution Place 1 drop into both eyes at bedtime. 09/15/17   [provider]  magnesium hydroxide (MILK OF MAGNESIA) 400 MG/5ML suspension Take 30 mLs by mouth daily as needed for moderate constipation.     [provider]  metoprolol succinate (TOPROL-XL) 25 MG 24 hr tablet Take 25 mg by mouth daily.    [provider]  nitroGLYCERIN (NITROSTAT) 0.4 MG SL tablet Place 0.4 mg under the tongue every 5 (five) minutes as needed for chest pain.     [provider]  ondansetron (ZOFRAN-ODT) 4 MG disintegrating tablet Take 1 tablet (4 mg total) by mouth every 6 (six) hours as needed for nausea. 04/12/18   Carolan Shiver, MD  senna (SENOKOT) 8.6 MG tablet Take 1-2 tablets by mouth daily as needed for constipation.    [provider]  simvastatin (ZOCOR) 40 MG tablet Take 40 mg by mouth at bedtime.     [provider]  SYMBICORT 80-4.5 MCG/ACT inhaler Inhale 2 puffs into the lungs 2 (two) times daily. 09/15/17   [provider]  tamsulosin (FLOMAX) 0.4 MG CAPS capsule Take 0.4 mg by mouth daily.     [provider]  timolol (TIMOPTIC) 0.5 % ophthalmic solution Place 1 drop into both eyes daily. 09/15/17   [provider]    Allergies  Allergen Reactions  . Compazine [Prochlorperazine] Other (See  Comments)    Dystonic rxn - convulsions. Near fatal reaction.   Rebekah Chesterfield [Sulfur] Other (See Comments)    History of alcoholism  . Benadryl [Diphenhydramine] Other (See Comments)    "stuffy", nasal congestion  . Depakote [Divalproex Sodium] Other (See Comments)    Cause elevated ammonia  . Dramamine [Dimenhydrinate] Swelling  . Plavix [Clopidogrel] Other (See Comments)    Rectal bleeding. "Perforated my intestines."    Family History  Problem Relation Age of Onset  . Lung cancer Mother   . Hypertension Father   . Heart attack Father   . CAD Father   . Prostate cancer Neg Hx   . Bladder Cancer Neg Hx   . Kidney cancer Neg Hx     Social History Social History  Tobacco Use  . Smoking status: Former Smoker    Packs/day: 1.00    Types: Cigarettes    Last attempt to quit: 2012    Years since quitting: 7.8  . Smokeless tobacco: Former Engineer, water Use Topics  . Alcohol use: No    Comment: no alcohol since 2010  . Drug use: No    Review of Systems  Constitutional: Negative for fever. Eyes: Negative for visual changes. ENT: Negative for sore throat. Cardiovascular: Negative for chest pain. Respiratory: Negative for shortness of breath. Gastrointestinal: Positive for abdominal pain and vomiting as per HPI.  Negative for diarrhea.. Genitourinary: Negative for dysuria. Musculoskeletal: Negative for back pain. Skin: Negative for rash. Neurological: Negative for headache.  ____________________________________________   PHYSICAL EXAM:  VITAL SIGNS: ED Triage Vitals  Enc Vitals Group     BP 06/05/18 1328 (!) 89/63     Pulse Rate 06/05/18 1328 98     Resp 06/05/18 1328 16     Temp 06/05/18 1328 98.4 F (36.9 C)     Temp Source 06/05/18 1328 Oral     SpO2 06/05/18 1328 100 %     Weight 06/05/18 1328 198 lb (89.8 kg)     Height 06/05/18 1328 6' (1.829 m)     Head Circumference --      Peak Flow --      Pain Score 06/05/18 1334 7     Pain Loc --       Pain Edu? --      Excl. in GC? --      Constitutional: Alert and oriented.  HEENT      Head: Normocephalic and atraumatic.      Eyes: Conjunctivae are normal. Pupils equal and round.       Ears:         Nose: No congestion/rhinnorhea.      Mouth/Throat: Mucous membranes are moderately dry.      Neck: No stridor. Cardiovascular/Chest: Normal rate, regular rhythm.  No murmurs, rubs, or gallops. Respiratory: Normal respiratory effort without tachypnea nor retractions. Breath sounds are clear and equal bilaterally. No wheezes/rales/rhonchi. Gastrointestinal: Soft. No distention, no guarding, no rebound.  Tenderness mid abdomen.  No focal right lower quadrant tenderness. Genitourinary/rectal:Deferred Musculoskeletal: Nontender with normal range of motion in all extremities. No joint effusions.  No lower extremity tenderness.  No edema. Neurologic:  Normal speech and language. No gross or focal neurologic deficits are appreciated. Skin:  Skin is warm, dry and intact. No rash noted. Psychiatric: Mood and affect are normal. Speech and behavior are normal. Patient exhibits appropriate insight and judgment.   ____________________________________________  LABS (pertinent positives/negatives) I, Governor Rooks, MD the attending physician have reviewed the labs noted below.  Labs Reviewed  COMPREHENSIVE METABOLIC PANEL - Abnormal; Notable for the following components:      Result Value   Sodium 131 (*)    Chloride 93 (*)    Glucose, Bld 126 (*)    Creatinine, Ser 1.52 (*)    GFR calc non Af Amer 47 (*)    GFR calc Af Amer 55 (*)    All other components within normal limits  CBC WITH DIFFERENTIAL/PLATELET - Abnormal; Notable for the following components:   WBC 14.1 (*)    Hemoglobin 12.6 (*)    Neutro Abs 11.1 (*)    Monocytes Absolute 1.2 (*)    All other components within normal limits  TROPONIN I    ____________________________________________    EKG I, Governor Rooks,  MD, the  attending physician have personally viewed and interpreted all ECGs.  91 beats per minute.  Normal sinus rhythm.  Narrow QS.  Normal axis.  Nonspecific T wave ____________________________________________  RADIOLOGY   Chest and abdomen x-ray, viewed and interpreted by myself:  No free air, nonspecific bowel gas pattern.    CT abdomen pelvis with contrast: Pending __________________________________________  PROCEDURES  Procedure(s) performed: None  Procedures  Critical Care performed: None   ____________________________________________  ED COURSE / ASSESSMENT AND PLAN  Pertinent labs & imaging results that were available during my care of the patient were reviewed by me and considered in my medical decision making (see chart for details).     Patient arrived looks somewhat dry with a history of fluid loss from multiple episodes of vomiting with nausea over the past 24 hours as well as mid abdominal pain.  No reported fever.  No surgical abdomen on exam but he is quite tender.  He has been heaving.  No diarrhea.  He did have cholecystectomy but this was almost a month ago now and does not seem to be likely related to this episode which started last 24 hours.  No known sick contacts or bad food exposures or travel significant travel history.  Plan for CT given pain as well as elevated white blood cell count.  Patient care transferred to Dr. Don Perking at shift change, 3:30 PM.  Pending CT the abdomen and reevaluation.    CONSULTATIONS: None   Patient / Family / Caregiver informed of clinical course, medical decision-making process, and agree with plan.     ___________________________________________   FINAL CLINICAL IMPRESSION(S) / ED DIAGNOSES   Final diagnoses:  Generalized abdominal pain  Nausea and vomiting, intractability of vomiting not specified, unspecified vomiting type      ___________________________________________         Note: This  dictation was prepared with Dragon dictation. Any transcriptional errors that result from this process are unintentional    Governor Rooks, MD 06/05/18 1539

## 2018-06-05 NOTE — ED Notes (Signed)
Ultrasound tech at bedside

## 2018-06-05 NOTE — ED Triage Notes (Addendum)
Pt to ED via POV, pt states that he had his gall bladder taken out the 2nd or 3rd week in October. Pt wife states that they were unable to take all of his gallbladder out due to inflammation. Pt reports that he started vomiting 16-18 hours ago. Pt states that he is having RUQ abdominal pain. Pt diaphoretic and hypotensive in triage.

## 2018-06-05 NOTE — ED Notes (Signed)
Pt stated that he was allergic to the benadryl and that he does not want to take it in order to have the CT scan. MD at bedside.

## 2018-06-05 NOTE — ED Notes (Signed)
Morphine and zofran given by [facility].

## 2018-06-08 MED FILL — Morphine Sulfate Inj 4 MG/ML: INTRAMUSCULAR | Qty: 1 | Status: AC

## 2018-06-08 MED FILL — Ondansetron HCl Inj 4 MG/2ML (2 MG/ML): INTRAMUSCULAR | Qty: 2 | Status: AC

## 2018-06-15 ENCOUNTER — Emergency Department: Payer: Medicare Other

## 2018-06-15 ENCOUNTER — Emergency Department
Admission: EM | Admit: 2018-06-15 | Discharge: 2018-06-15 | Disposition: A | Discharge status: Home / Self Care | Payer: Medicare Other | Attending: Emergency Medicine | Admitting: Emergency Medicine

## 2018-06-15 ENCOUNTER — Other Ambulatory Visit: Payer: Self-pay

## 2018-06-15 ENCOUNTER — Encounter: Payer: Self-pay | Admitting: Emergency Medicine

## 2018-06-15 DIAGNOSIS — Z955 Presence of coronary angioplasty implant and graft: Secondary | ICD-10-CM | POA: Insufficient documentation

## 2018-06-15 DIAGNOSIS — I10 Essential (primary) hypertension: Secondary | ICD-10-CM | POA: Insufficient documentation

## 2018-06-15 DIAGNOSIS — G8918 Other acute postprocedural pain: Secondary | ICD-10-CM

## 2018-06-15 DIAGNOSIS — Z79899 Other long term (current) drug therapy: Secondary | ICD-10-CM | POA: Diagnosis not present

## 2018-06-15 DIAGNOSIS — R1013 Epigastric pain: Secondary | ICD-10-CM | POA: Diagnosis present

## 2018-06-15 DIAGNOSIS — I2510 Atherosclerotic heart disease of native coronary artery without angina pectoris: Secondary | ICD-10-CM | POA: Insufficient documentation

## 2018-06-15 DIAGNOSIS — J449 Chronic obstructive pulmonary disease, unspecified: Secondary | ICD-10-CM | POA: Diagnosis not present

## 2018-06-15 DIAGNOSIS — Z87891 Personal history of nicotine dependence: Secondary | ICD-10-CM | POA: Diagnosis not present

## 2018-06-15 LAB — COMPREHENSIVE METABOLIC PANEL
ALT: 12 U/L (ref 0–44)
ANION GAP: 7 (ref 5–15)
AST: 18 U/L (ref 15–41)
Albumin: 3.4 g/dL — ABNORMAL LOW (ref 3.5–5.0)
Alkaline Phosphatase: 51 U/L (ref 38–126)
BILIRUBIN TOTAL: 0.4 mg/dL (ref 0.3–1.2)
BUN: 6 mg/dL — AB (ref 8–23)
CHLORIDE: 104 mmol/L (ref 98–111)
CO2: 24 mmol/L (ref 22–32)
Calcium: 8.8 mg/dL — ABNORMAL LOW (ref 8.9–10.3)
Creatinine, Ser: 1.27 mg/dL — ABNORMAL HIGH (ref 0.61–1.24)
GFR calc Af Amer: >60 mL/min (ref >60)
GFR calc non Af Amer: 59 mL/min — ABNORMAL LOW (ref >60)
Glucose, Bld: 108 mg/dL — ABNORMAL HIGH (ref 70–99)
POTASSIUM: 3.8 mmol/L (ref 3.5–5.1)
Sodium: 135 mmol/L (ref 135–145)
TOTAL PROTEIN: 5.9 g/dL — AB (ref 6.5–8.1)

## 2018-06-15 LAB — URINALYSIS, COMPLETE (UACMP) WITH MICROSCOPIC
Bacteria, UA: NONE SEEN
Bilirubin Urine: NEGATIVE
Glucose, UA: NEGATIVE mg/dL
Hgb urine dipstick: NEGATIVE
Ketones, ur: NEGATIVE mg/dL
Leukocytes, UA: NEGATIVE
Nitrite: NEGATIVE
Protein, ur: NEGATIVE mg/dL
Specific Gravity, Urine: 1.01 (ref 1.005–1.030)
Squamous Epithelial / HPF: NONE SEEN (ref 0–5)
pH: 6 (ref 5.0–8.0)

## 2018-06-15 LAB — CBC
HCT: 30.7 % — ABNORMAL LOW (ref 39.0–52.0)
Hemoglobin: 9.8 g/dL — ABNORMAL LOW (ref 13.0–17.0)
MCH: 26.8 pg (ref 26.0–34.0)
MCHC: 31.9 g/dL (ref 30.0–36.0)
MCV: 83.9 fL (ref 80.0–100.0)
PLATELETS: 527 10*3/uL — AB (ref 150–400)
RBC: 3.66 MIL/uL — ABNORMAL LOW (ref 4.22–5.81)
RDW: 15 % (ref 11.5–15.5)
WBC: 7.5 10*3/uL (ref 4.0–10.5)
nRBC: 0 % (ref 0.0–0.2)

## 2018-06-15 LAB — LACTIC ACID, PLASMA: Lactic Acid, Venous: 1.1 mmol/L (ref 0.5–1.9)

## 2018-06-15 LAB — LIPASE, BLOOD: LIPASE: 49 U/L (ref 11–51)

## 2018-06-15 MED ORDER — MORPHINE SULFATE (PF) 4 MG/ML IV SOLN: 4.0000 mg | INTRAVENOUS | Status: DC | PRN

## 2018-06-15 MED ORDER — ALUMINUM-MAGNESIUM-SIMETHICONE 200-200-20 MG/5ML PO SUSP: 30.0000 mL | Freq: Three times a day (TID) | ORAL | 0 refills | Status: DC

## 2018-06-15 MED ORDER — SODIUM CHLORIDE 0.9 % IV BOLUS
1000.0000 mL | Freq: Once | INTRAVENOUS | Status: AC
  Administered 2018-06-15: 1000 mL via INTRAVENOUS

## 2018-06-15 MED ORDER — METOCLOPRAMIDE HCL 5 MG/ML IJ SOLN
10.0000 mg | Freq: Once | INTRAMUSCULAR | Status: AC
  Administered 2018-06-15: 10 mg via INTRAVENOUS
  Filled 2018-06-15: qty 2

## 2018-06-15 MED ORDER — ONDANSETRON HCL 4 MG/2ML IJ SOLN
4.0000 mg | Freq: Once | INTRAMUSCULAR | Status: AC
  Administered 2018-06-15: 4 mg via INTRAVENOUS
  Filled 2018-06-15: qty 2

## 2018-06-15 MED ORDER — FAMOTIDINE 20 MG PO TABS
40.0000 mg | ORAL_TABLET | Freq: Once | ORAL | Status: AC
  Administered 2018-06-15: 40 mg via ORAL
  Filled 2018-06-15: qty 2

## 2018-06-15 MED ORDER — FAMOTIDINE 20 MG PO TABS: 20.0000 mg | ORAL_TABLET | Freq: Two times a day (BID) | ORAL | 0 refills | Status: DC

## 2018-06-15 MED ORDER — LIDOCAINE VISCOUS HCL 2 % MT SOLN
15.0000 mL | Freq: Once | OROMUCOSAL | Status: AC
  Administered 2018-06-15: 15 mL via ORAL
  Filled 2018-06-15: qty 15

## 2018-06-15 MED ORDER — ALUM & MAG HYDROXIDE-SIMETH 200-200-20 MG/5ML PO SUSP
30.0000 mL | Freq: Once | ORAL | Status: AC
  Administered 2018-06-15: 30 mL via ORAL
  Filled 2018-06-15: qty 30

## 2018-06-15 NOTE — ED Triage Notes (Signed)
Pt reports that he had Gall bladder surgery in [facility] a week from today, he is still having pain and nausea. He went to his PMD who sent him here to be checked out because he reports that his B/P was low and he had a low grade temp.

## 2018-06-15 NOTE — ED Notes (Signed)
Pt given cup of water for PO challenge

## 2018-06-15 NOTE — ED Notes (Signed)
Pt able to drink entire cup of water, no nausea or vomiting at this time

## 2018-06-15 NOTE — ED Provider Notes (Signed)
[facility] Emergency Department Provider Note  ____________________________________________  Time seen: Approximately 7:53 PM  I have reviewed the triage vital signs and the nursing notes.   HISTORY  Chief Complaint Post-op Problem    HPI Brandon [name] is a 62 y.o. male with a history of COPD hypertension schizophrenia and recent cholecystectomy who complains of abdominal pain nausea and vomiting for the past 2 weeks ever since his cholecystectomy.  His postoperative course was complicated by a fluid collection in the gallbladder fossa that was thought to be infected.  He was hospitalized at [facility], given IV antibiotics, and he improved.  He was discharged home reports that the pain is been persistent.  He finished taking his antibiotics today.  No fevers chills or sweats.  No diarrhea or constipation.  Pain is waxing and waning, epigastric, nonradiating.  No aggravating or alleviating factors.      Past Medical History:  Diagnosis Date  . Anemia    IDA  . Colon polyps   . COPD (chronic obstructive pulmonary disease) (HCC)   . Coronary artery disease   . Heart attack (HCC) 2005  . Hiatal hernia   . History of ETOH abuse   . Hyperlipidemia   . Hypertension   . Pleurisy   . Schizophrenia (HCC)   . Schizophrenia (HCC)   . Seizures (HCC)    grand mal in 2000 or 2001 recovery from alcoholism  . Suicide attempt [facility])      Patient Active Problem List   Diagnosis Date Noted  . Acute cholecystitis 04/10/2018  . Chronic cholecystitis 04/08/2018  . Atherosclerosis of native arteries of extremity with intermittent claudication (HCC) 11/22/2017  . Essential hypertension 11/22/2017  . Hyperlipidemia 11/22/2017  . COPD (chronic obstructive pulmonary disease) (HCC) 11/22/2017  . CAD (coronary artery disease) 10/15/2017     Past Surgical History:  Procedure Laterality Date  . ABDOMINAL SURGERY     internal bleeding  . CHOLECYSTECTOMY N/A 05/04/2018    Procedure: LAPAROSCOPIC CHOLECYSTECTOMY, converted to open;  Surgeon: Sung Amabile, DO;  Location: [facility] ORS;  Service: General;  Laterality: N/A;  . COLONOSCOPY    . COLONOSCOPY WITH PROPOFOL N/A 05/09/2016   Procedure: COLONOSCOPY WITH PROPOFOL;  Surgeon: Scot Jun, MD;  Location: [facility] ENDOSCOPY;  Service: Endoscopy;  Laterality: N/A;  . CORONARY ANGIOPLASTY WITH STENT PLACEMENT     1 vessel  . ESOPHAGOGASTRODUODENOSCOPY (EGD) WITH PROPOFOL N/A 05/09/2016   Procedure: ESOPHAGOGASTRODUODENOSCOPY (EGD) WITH PROPOFOL;  Surgeon: Scot Jun, MD;  Location: [facility] ENDOSCOPY;  Service: Endoscopy;  Laterality: N/A;  . EYE SURGERY Right   . GLAUCOMA SURGERY    . LEFT HEART CATH Right 10/19/2017   Procedure: Left Heart Cath and Coronary Angiography;  Surgeon: Laurier Nancy, MD;  Location: [facility] INVASIVE CV LAB;  Service: Cardiovascular;  Laterality: Right;  . NOSE SURGERY    . 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 VASECTOMY       Prior to Admission medications   Medication Sig Start Date End Date Taking? Authorizing Provider  amLODipine (NORVASC) 5 MG tablet Take 5 mg by mouth daily.    [provider]  buPROPion (WELLBUTRIN SR) 100 MG 12 hr tablet Take 100 mg by mouth daily.     [provider]  carvedilol (COREG) 25 MG tablet Take 25 mg by mouth 2 (two) times daily with a meal.    [provider]  cloZAPine (CLOZARIL) 100 MG tablet Take 150 mg by  mouth at bedtime.     [provider]  docusate sodium (COLACE) 100 MG capsule Take 1 tablet once or twice daily as needed for constipation while taking narcotic pain medicine Patient taking differently: Take 100 mg by mouth 2 (two) times daily as needed for mild constipation.  04/23/18   Loleta Rose, MD  ezetimibe (ZETIA) 10 MG tablet Take 10 mg by mouth at bedtime.     [provider]  famotidine (PEPCID) 20 MG tablet Take 1 tablet (20 mg total) by mouth 2 (two) times daily. 04/08/18   Sharman Cheek, MD  fenofibrate 160 MG tablet Take 160 mg by mouth daily.    [provider]  fluticasone (FLONASE) 50 MCG/ACT nasal spray Place 2 sprays into both nostrils daily.     [provider]  hydrOXYzine (VISTARIL) 25 MG capsule Take 25 mg by mouth 2 (two) times daily as needed for anxiety.    [provider]  ibuprofen (ADVIL,MOTRIN) 800 MG tablet Take 1 tablet (800 mg total) by mouth every 8 (eight) hours as needed for mild pain or moderate pain (for mild pain not relieved by other medications.). 05/07/18   Tonna Boehringer, Isami, DO  isosorbide mononitrate (IMDUR) 30 MG 24 hr tablet Take 30 mg by mouth 2 (two) times daily.     [provider]  latanoprost (XALATAN) 0.005 % ophthalmic solution Place 1 drop into both eyes at bedtime. 09/15/17   [provider]  magnesium hydroxide (MILK OF MAGNESIA) 400 MG/5ML suspension Take 30 mLs by mouth daily as needed for moderate constipation.     [provider]  metoprolol succinate (TOPROL-XL) 25 MG 24 hr tablet Take 25 mg by mouth daily.    [provider]  nitroGLYCERIN (NITROSTAT) 0.4 MG SL tablet Place 0.4 mg under the tongue every 5 (five) minutes as needed for chest pain.     [provider]  ondansetron (ZOFRAN-ODT) 4 MG disintegrating tablet Take 1 tablet (4 mg total) by mouth every 6 (six) hours as needed for nausea. 04/12/18   Carolan Shiver, MD  senna (SENOKOT) 8.6 MG tablet Take 1-2 tablets by mouth daily as needed for constipation.    [provider]  simvastatin (ZOCOR) 40 MG tablet Take 40 mg by mouth at bedtime.     [provider]  SYMBICORT 80-4.5 MCG/ACT inhaler Inhale 2 puffs into the lungs 2 (two) times daily. 09/15/17   [provider]  tamsulosin (FLOMAX) 0.4 MG CAPS capsule Take 0.4 mg by mouth daily.     [provider]  timolol (TIMOPTIC) 0.5 % ophthalmic solution Place 1 drop into both eyes daily. 09/15/17   [provider]      Allergies Compazine [prochlorperazine]; Ivp dye [iodinated diagnostic agents]; Alcohol-sulfur [sulfur]; Benadryl [diphenhydramine]; Depakote [divalproex sodium]; Dramamine [dimenhydrinate]; and Plavix [clopidogrel]   Family History  Problem Relation Age of Onset  . Lung cancer Mother   . Hypertension Father   . Heart attack Father   . CAD Father   . Prostate cancer Neg Hx   . Bladder Cancer Neg Hx   . Kidney cancer Neg Hx     Social History Social History   Tobacco Use  . Smoking status: Former Smoker    Packs/day: 1.00    Types: Cigarettes    Last attempt to quit: 2012    Years since quitting: 7.8  . Smokeless tobacco: Former Engineer, water Use Topics  . Alcohol use: No    Comment: no  alcohol since 2010  . Drug use: No    Review of Systems  Constitutional:   No fever or chills.  ENT:   No sore throat. No rhinorrhea. Cardiovascular:   No chest pain or syncope. Respiratory:   No dyspnea or cough. Gastrointestinal: Positive as above for abdominal pain and vomiting without diarrhea or constipation.  Musculoskeletal:   Negative for focal pain or swelling All other systems reviewed and are negative except as documented above in ROS and HPI.  ____________________________________________   PHYSICAL EXAM:  VITAL SIGNS: ED Triage Vitals  Enc Vitals Group     BP 06/15/18 1533 110/81     Pulse Rate 06/15/18 1533 96     Resp 06/15/18 1533 20     Temp 06/15/18 1533 97.9 F (36.6 C)     Temp Source 06/15/18 1533 Oral     SpO2 06/15/18 1533 98 %     Weight 06/15/18 1534 198 lb (89.8 kg)     Height 06/15/18 1534 6' (1.829 m)     Head Circumference --      Peak Flow --      Pain Score 06/15/18 1533 4     Pain Loc --      Pain Edu? --      Excl. in GC? --     Vital signs reviewed, nursing assessments reviewed.   Constitutional:   Alert and oriented. Non-toxic appearance. Eyes:   Conjunctivae are normal. EOMI. PERRL. ENT      Head:   Normocephalic and  atraumatic.      Nose:   No congestion/rhinnorhea.       Mouth/Throat:   MMM, no pharyngeal erythema. No peritonsillar mass.       Neck:   No meningismus. Full ROM. Hematological/Lymphatic/Immunilogical:   No cervical lymphadenopathy. Cardiovascular:   RRR. Symmetric bilateral radial and DP pulses.  No murmurs. Cap refill less than 2 seconds. Respiratory:   Normal respiratory effort without tachypnea/retractions. Breath sounds are clear and equal bilaterally. No wheezes/rales/rhonchi. Gastrointestinal:   Soft with focal epigastric tenderness. Non distended. There is no CVA tenderness.  No rebound, rigidity, or guarding. Musculoskeletal:   Normal range of motion in all extremities. No joint effusions.  No lower extremity tenderness.  No edema. Neurologic:   Normal speech and language.  Motor grossly intact. No acute focal neurologic deficits are appreciated.  Skin:    Skin is warm, dry and intact. No rash noted.  No petechiae, purpura, or bullae.  ____________________________________________    LABS (pertinent positives/negatives) (all labs ordered are listed, but only abnormal results are displayed) Labs Reviewed  COMPREHENSIVE METABOLIC PANEL - Abnormal; Notable for the following components:      Result Value   Glucose, Bld 108 (*)    BUN 6 (*)    Creatinine, Ser 1.27 (*)    Calcium 8.8 (*)    Total Protein 5.9 (*)    Albumin 3.4 (*)    GFR calc non Af Amer 59 (*)    All other components within normal limits  CBC - Abnormal; Notable for the following components:   RBC 3.66 (*)    Hemoglobin 9.8 (*)    HCT 30.7 (*)    Platelets 527 (*)    All other components within normal limits  URINALYSIS, COMPLETE (UACMP) WITH MICROSCOPIC - Abnormal; Notable for the following components:   Color, Urine YELLOW (*)    APPearance CLEAR (*)    All other components within normal limits  LIPASE, BLOOD  LACTIC ACID, PLASMA    ____________________________________________   EKG    ____________________________________________    RADIOLOGY  Ct Abdomen Pelvis Wo Contrast  Result Date: 06/15/2018 CLINICAL DATA:  62 y/o M; abdominal pain, fever, abscess suspected. Cholecystectomy 05/04/2018. EXAM: CT ABDOMEN AND PELVIS WITHOUT CONTRAST TECHNIQUE: Multidetector CT imaging of the abdomen and pelvis was performed following the standard protocol without IV contrast. COMPARISON:  06/05/2018 CT abdomen and pelvis. FINDINGS: Lower chest: No acute abnormality. Hepatobiliary: No focal liver abnormality is seen. Status post cholecystectomy. No biliary dilatation. No appreciable residual periduodenal or gallbladder fossa fluid collection. Pancreas: Unremarkable. No pancreatic ductal dilatation or surrounding inflammatory changes. Spleen: Normal in size without focal abnormality. Adrenals/Urinary Tract: Adrenal glands are unremarkable. Stable cysts within the lower pole of right kidney measuring up to 32 mm. Otherwise, kidneys are normal, without renal calculi, focal lesion, or hydronephrosis. Bladder is unremarkable. Stomach/Bowel: Stomach is within normal limits. Appendix not identified, no pericecal inflammation. No evidence of bowel wall thickening, distention, or inflammatory changes. Mild duodenal diverticulosis, no findings of acute diverticulitis. Vascular/Lymphatic: Aortic atherosclerosis. No enlarged abdominal or pelvic lymph nodes. Reproductive: Prostate is unremarkable. Other: No abdominal wall hernia or abnormality. No abdominopelvic ascites. Musculoskeletal: No fracture is seen. IMPRESSION: 1. No acute process identified as explanation for abdominal pain. 2. Status post cholecystectomy. No appreciable residual. Duodenum or gallbladder fossa fluid collection on this noncontrast examination. 3. Mild duodenal diverticulosis, no findings of acute 4.  Aortic Atherosclerosis (ICD10-I70.0). Electronically Signed   By: Mitzi Hansen M.D.   On: 06/15/2018 17:32    ____________________________________________   PROCEDURES Procedures  ____________________________________________  DIFFERENTIAL DIAGNOSIS   Intra-abdominal abscess, duodenal perforation, gastritis/medication side effect, bowel obstruction, pancreatitis  CLINICAL IMPRESSION / ASSESSMENT AND PLAN / ED COURSE  Pertinent labs & imaging results that were available during my care of the patient were reviewed by me and considered in my medical decision making (see chart for details).    Patient not in distress, nontoxic.  Vital signs are normal in the ED.  Note from his primary care doctor, Dr. Ellsworth Lennox, reviewed.  He had temp of 99.3 in the clinic.  Blood pressure was 90/60 in the clinic.  No confirmed hypotension in the ED.  He does not appear to be in shock.  With his complicated recent surgical history, CT scan is obtained which fortunately did not show any acute pathology.  Labs were unremarkable.  Over period of observation in the ED his vital signs remain normal.  With GI cocktail, he is feeling better, tolerating oral intake.  Suitable for discharge home.  I suspect that this is gastritis related to his antibiotics, which now should improve since he is completed the course.      ____________________________________________   FINAL CLINICAL IMPRESSION(S) / ED DIAGNOSES    Final diagnoses:  Post-operative pain     ED Discharge Orders    None      Portions of this note were generated with dragon dictation software. Dictation errors may occur despite best attempts at proofreading.    Sharman Cheek, MD 06/15/18 [phone]

## 2018-06-17 ENCOUNTER — Ambulatory Visit (INDEPENDENT_AMBULATORY_CARE_PROVIDER_SITE_OTHER): Payer: Medicare Other | Admitting: Vascular Surgery

## 2018-06-17 ENCOUNTER — Encounter (INDEPENDENT_AMBULATORY_CARE_PROVIDER_SITE_OTHER): Payer: Medicare Other

## 2018-06-17 ENCOUNTER — Ambulatory Visit (INDEPENDENT_AMBULATORY_CARE_PROVIDER_SITE_OTHER): Payer: Medicare Other | Admitting: Nurse Practitioner

## 2018-06-17 ENCOUNTER — Encounter (INDEPENDENT_AMBULATORY_CARE_PROVIDER_SITE_OTHER): Payer: Self-pay | Admitting: Nurse Practitioner

## 2018-06-17 ENCOUNTER — Ambulatory Visit (INDEPENDENT_AMBULATORY_CARE_PROVIDER_SITE_OTHER): Payer: Medicare Other

## 2018-06-17 VITALS — BP 91/64 | HR 88 | Resp 16 | Wt 188.0 lb

## 2018-06-17 DIAGNOSIS — I70213 Atherosclerosis of native arteries of extremities with intermittent claudication, bilateral legs: Secondary | ICD-10-CM

## 2018-06-17 DIAGNOSIS — Z87891 Personal history of nicotine dependence: Secondary | ICD-10-CM

## 2018-06-17 DIAGNOSIS — I10 Essential (primary) hypertension: Secondary | ICD-10-CM

## 2018-06-17 DIAGNOSIS — E782 Mixed hyperlipidemia: Secondary | ICD-10-CM | POA: Diagnosis not present

## 2018-06-23 ENCOUNTER — Encounter (INDEPENDENT_AMBULATORY_CARE_PROVIDER_SITE_OTHER): Payer: Self-pay | Admitting: Nurse Practitioner

## 2018-06-23 NOTE — Progress Notes (Signed)
Subjective:    Patient ID: Brandon [name], male    DOB: February 21, 1956, 62 y.o.   MRN: [id number] Chief Complaint  Patient presents with  . Follow-up    74month ABI    HPI  Brandon [name] is a 62 y.o. male that returns to the office for followup and review of the noninvasive studies. There have been no interval changes in lower extremity symptoms. No interval shortening of the patient's claudication distance or development of rest pain symptoms. No new ulcers or wounds have occurred since the last visit.  There have been no significant changes to the patient's overall health care.  The patient denies amaurosis fugax or recent TIA symptoms. There are no recent neurological changes noted. The patient denies history of DVT, PE or superficial thrombophlebitis. The patient denies recent episodes of angina or shortness of breath.   ABI Rt=1.17 and Lt=1.18  (no previous ABIs) Triphasic waveforms bilaterally.    Past Medical History:  Diagnosis Date  . Anemia    IDA  . Colon polyps   . COPD (chronic obstructive pulmonary disease) (HCC)   . Coronary artery disease   . Heart attack (HCC) 2005  . Hiatal hernia   . History of ETOH abuse   . Hyperlipidemia   . Hypertension   . Pleurisy   . Schizophrenia (HCC)   . Schizophrenia (HCC)   . Seizures (HCC)    grand mal in 2000 or 2001 recovery from alcoholism  . Suicide attempt [facility])     Past Surgical History:  Procedure Laterality Date  . ABDOMINAL SURGERY     internal bleeding  . CHOLECYSTECTOMY N/A 05/04/2018   Procedure: LAPAROSCOPIC CHOLECYSTECTOMY, converted to open;  Surgeon: Sung Amabile, DO;  Location: [facility] ORS;  Service: General;  Laterality: N/A;  . COLONOSCOPY    . COLONOSCOPY WITH PROPOFOL N/A 05/09/2016   Procedure: COLONOSCOPY WITH PROPOFOL;  Surgeon: Scot Jun, MD;  Location: [facility] ENDOSCOPY;  Service: Endoscopy;  Laterality: N/A;  . CORONARY ANGIOPLASTY WITH STENT PLACEMENT     1 vessel  .  ESOPHAGOGASTRODUODENOSCOPY (EGD) WITH PROPOFOL N/A 05/09/2016   Procedure: ESOPHAGOGASTRODUODENOSCOPY (EGD) WITH PROPOFOL;  Surgeon: Scot Jun, MD;  Location: [facility] ENDOSCOPY;  Service: Endoscopy;  Laterality: N/A;  . EYE SURGERY Right   . GLAUCOMA SURGERY    . LEFT HEART CATH Right 10/19/2017   Procedure: Left Heart Cath and Coronary Angiography;  Surgeon: Laurier Nancy, MD;  Location: [facility] INVASIVE CV LAB;  Service: Cardiovascular;  Laterality: Right;  . NOSE SURGERY    . 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 VASECTOMY      Social History   Socioeconomic History  . Marital status: Married    Spouse name: Not on file  . Number of children: Not on file  . Years of education: Not on file  . Highest education level: Not on file  Occupational History  . Not on file  Social Needs  . Financial resource strain: Not on file  . Food insecurity:    Worry: Not on file    Inability: Not on file  . Transportation needs:    Medical: Not on file    Non-medical: Not on file  Tobacco Use  . Smoking status: Former Smoker    Packs/day: 1.00    Types: Cigarettes    Last attempt to quit: 2012    Years since quitting: 7.8  . Smokeless tobacco: Former Engineer, water and Sexual Activity  .  Alcohol use: No    Comment: no alcohol since 2010  . Drug use: No  . Sexual activity: Not on file  Lifestyle  . Physical activity:    Days per week: Not on file    Minutes per session: Not on file  . Stress: Not on file  Relationships  . Social connections:    Talks on phone: Not on file    Gets together: Not on file    Attends religious service: Not on file    Active member of club or organization: Not on file    Attends meetings of clubs or organizations: Not on file    Relationship status: Not on file  . Intimate partner violence:    Fear of current or ex partner: Not on file    Emotionally abused: Not on file    Physically abused: Not on file    Forced sexual activity: Not on file  Other  Topics Concern  . Not on file  Social History Narrative  . Not on file    Family History  Problem Relation Age of Onset  . Lung cancer Mother   . Hypertension Father   . Heart attack Father   . CAD Father   . Prostate cancer Neg Hx   . Bladder Cancer Neg Hx   . Kidney cancer Neg Hx     Allergies  Allergen Reactions  . Compazine [Prochlorperazine] Other (See Comments)    Dystonic rxn - convulsions. Near fatal reaction.   Ardine Bjork [Iodinated Diagnostic Agents] Shortness Of Breath    Pt states SOB after last IV contrast injection  . Alcohol-Sulfur [Sulfur] Other (See Comments)    History of alcoholism  . Benadryl [Diphenhydramine] Other (See Comments)    "stuffy", nasal congestion  . Depakote [Divalproex Sodium] Other (See Comments)    Cause elevated ammonia  . Dramamine [Dimenhydrinate] Swelling  . Plavix [Clopidogrel] Other (See Comments)    Rectal bleeding. "Perforated my intestines."     Review of Systems   Review of Systems: Negative Unless Checked Constitutional: [] Weight loss  [] Fever  [] Chills Cardiac: [] Chest pain   []  Atrial Fibrillation  [] Palpitations   [] Shortness of breath when laying flat   [] Shortness of breath with exertion. Vascular:  [] Pain in legs with walking   [] Pain in legs with standing  [] History of DVT   [] Phlebitis   [] Swelling in legs   [] Varicose veins   [] Non-healing ulcers Pulmonary:   [] Uses home oxygen   [] Productive cough   [] Hemoptysis   [] Wheeze  [] COPD   [] Asthma Neurologic:  [] Dizziness   [] Seizures   [] History of stroke   [] History of TIA  [] Aphasia   [] Vissual changes   [] Weakness or numbness in arm   [] Weakness or numbness in leg Musculoskeletal:   [] Joint swelling   [] Joint pain   [] Low back pain  []  History of Knee Replacement Hematologic:  [] Easy bruising  [] Easy bleeding   [] Hypercoagulable state   [] Anemic Gastrointestinal:  [] Diarrhea   [] Vomiting  [] Gastroesophageal reflux/heartburn   [] Difficulty swallowing. Genitourinary:   [] Chronic kidney disease   [] Difficult urination  [] Anuric   [] Blood in urine Skin:  [] Rashes   [] Ulcers  Psychological:  [x] History of anxiety   [x]  History of major depression  []  Memory Difficulties     Objective:   Physical Exam  BP 91/64 (BP Location: Right Arm)   Pulse 88   Resp 16   Wt 188 lb (85.3 kg)   BMI 25.50 kg/m  Gen: WD/WN, NAD Head: [place]/AT, No temporalis wasting.  Ear/Nose/Throat: Hearing grossly intact, nares w/o erythema or drainage Eyes: PER, EOMI, sclera nonicteric.  Neck: Supple, no masses.  No JVD.  Pulmonary:  Good air movement, no use of accessory muscles.  Cardiac: RRR Vascular:  Vessel Right Left  Radial Palpable Palpable  Dorsalis Pedis Palpable Palpable  Posterior Tibial Palpable Palpable   Gastrointestinal: soft, non-distended. No guarding/no peritoneal signs.  Musculoskeletal: M/S 5/5 throughout.  No deformity or atrophy.  Neurologic: Pain and light touch intact in extremities.  Symmetrical.  Speech is fluent. Motor exam as listed above. Psychiatric: Judgment intact, Mood & affect appropriate for pt's clinical situation. Dermatologic: No Venous rashes. No Ulcers Noted.  No changes consistent with cellulitis. Lymph : No Cervical lymphadenopathy, no lichenification or skin changes of chronic lymphedema.      Assessment & Plan:   1. Atherosclerosis of native artery of both lower extremities with intermittent claudication (HCC) Recommend:  I do not find evidence of life style limiting vascular disease. The patient specifically denies life style limitation.  Previous noninvasive studies including ABI's of the legs do not identify critical vascular problems.  The patient should continue walking and begin a more formal exercise program. The patient should continue his antiplatelet therapy and aggressive treatment of the lipid abnormalities.  The patient should begin wearing graduated compression socks 15-20 mmHg strength to control her mild  edema.  Patient will follow up in 1 year   - VAS US ABI WITH/WO TBI; Future  2. Mixed hyperlipidemia Continue statin as ordered and reviewed, no changes at this time   3. Essential hypertension Continue antihypertensive medications as already ordered, these medications have been reviewed and there are no changes at this time.    Current Outpatient Medications on File Prior to Visit  Medication Sig Dispense Refill  . aluminum-magnesium hydroxide-simethicone (MAALOX) 200-200-20 MG/5ML SUSP Take 30 mLs by mouth 4 (four) times daily -  before meals and at bedtime. 355 mL 0  . amLODipine (NORVASC) 5 MG tablet Take 5 mg by mouth daily.    Marland Kitchen. buPROPion (WELLBUTRIN SR) 100 MG 12 hr tablet Take 100 mg by mouth daily.     . carvedilol (COREG) 25 MG tablet Take 25 mg by mouth 2 (two) times daily with a meal.    . cloZAPine (CLOZARIL) 100 MG tablet Take 150 mg by mouth at bedtime.     . docusate sodium (COLACE) 100 MG capsule Take 1 tablet once or twice daily as needed for constipation while taking narcotic pain medicine (Patient taking differently: Take 100 mg by mouth 2 (two) times daily as needed for mild constipation. ) 30 capsule 0  . ezetimibe (ZETIA) 10 MG tablet Take 10 mg by mouth at bedtime.     . famotidine (PEPCID) 20 MG tablet Take 1 tablet (20 mg total) by mouth 2 (two) times daily. 60 tablet 0  . fenofibrate 160 MG tablet Take 160 mg by mouth daily.    . fluticasone (FLONASE) 50 MCG/ACT nasal spray Place 2 sprays into both nostrils daily.     . hydrOXYzine (VISTARIL) 25 MG capsule Take 25 mg by mouth 2 (two) times daily as needed for anxiety.    Marland Kitchen. ibuprofen (ADVIL,MOTRIN) 800 MG tablet Take 1 tablet (800 mg total) by mouth every 8 (eight) hours as needed for mild pain or moderate pain (for mild pain not relieved by other medications.). 30 tablet 1  . isosorbide mononitrate (IMDUR) 30 MG 24 hr tablet  Take 30 mg by mouth 2 (two) times daily.     Marland Kitchen latanoprost (XALATAN) 0.005 %  ophthalmic solution Place 1 drop into both eyes at bedtime.    . magnesium hydroxide (MILK OF MAGNESIA) 400 MG/5ML suspension Take 30 mLs by mouth daily as needed for moderate constipation.     . metoprolol succinate (TOPROL-XL) 25 MG 24 hr tablet Take 25 mg by mouth daily.    . nitroGLYCERIN (NITROSTAT) 0.4 MG SL tablet Place 0.4 mg under the tongue every 5 (five) minutes as needed for chest pain.     Marland Kitchen ondansetron (ZOFRAN-ODT) 4 MG disintegrating tablet Take 1 tablet (4 mg total) by mouth every 6 (six) hours as needed for nausea. 20 tablet 0  . senna (SENOKOT) 8.6 MG tablet Take 1-2 tablets by mouth daily as needed for constipation.    . simvastatin (ZOCOR) 40 MG tablet Take 40 mg by mouth at bedtime.     . SYMBICORT 80-4.5 MCG/ACT inhaler Inhale 2 puffs into the lungs 2 (two) times daily.    . tamsulosin (FLOMAX) 0.4 MG CAPS capsule Take 0.4 mg by mouth daily.     . timolol (TIMOPTIC) 0.5 % ophthalmic solution Place 1 drop into both eyes daily.     No current facility-administered medications on file prior to visit.     There are no Patient Instructions on file for this visit. Return in about 1 year (around 06/18/2019).   Georgiana Spinner, NP  This note was completed with Office manager.  Any errors are purely unintentional.

## 2018-06-24 ENCOUNTER — Other Ambulatory Visit: Payer: Self-pay | Admitting: Surgery

## 2018-06-24 DIAGNOSIS — R1013 Epigastric pain: Secondary | ICD-10-CM

## 2018-06-29 ENCOUNTER — Ambulatory Visit: Payer: Medicare Other | Attending: Surgery

## 2018-07-07 ENCOUNTER — Ambulatory Visit: Payer: Medicare Other

## 2018-07-17 ENCOUNTER — Encounter
Admission: EM | Disposition: A | Discharge status: Home / Self Care | Payer: Self-pay | Source: Home / Self Care | Attending: Surgery

## 2018-07-17 ENCOUNTER — Other Ambulatory Visit: Payer: Self-pay

## 2018-07-17 ENCOUNTER — Inpatient Hospital Stay
Admission: EM | Admit: 2018-07-17 | Discharge: 2018-07-20 | DRG: 339 | Disposition: A | Discharge status: Home / Self Care | Payer: Medicare Other | Attending: Surgery | Admitting: Surgery

## 2018-07-17 ENCOUNTER — Encounter: Payer: Self-pay | Admitting: Emergency Medicine

## 2018-07-17 ENCOUNTER — Emergency Department: Payer: Medicare Other | Admitting: Anesthesiology

## 2018-07-17 ENCOUNTER — Emergency Department: Payer: Medicare Other

## 2018-07-17 DIAGNOSIS — D649 Anemia, unspecified: Secondary | ICD-10-CM | POA: Diagnosis present

## 2018-07-17 DIAGNOSIS — E860 Dehydration: Secondary | ICD-10-CM | POA: Diagnosis not present

## 2018-07-17 DIAGNOSIS — Z8249 Family history of ischemic heart disease and other diseases of the circulatory system: Secondary | ICD-10-CM

## 2018-07-17 DIAGNOSIS — K3580 Unspecified acute appendicitis: Secondary | ICD-10-CM

## 2018-07-17 DIAGNOSIS — Z8719 Personal history of other diseases of the digestive system: Secondary | ICD-10-CM

## 2018-07-17 DIAGNOSIS — K3532 Acute appendicitis with perforation and localized peritonitis, without abscess: Secondary | ICD-10-CM | POA: Diagnosis not present

## 2018-07-17 DIAGNOSIS — Z888 Allergy status to other drugs, medicaments and biological substances status: Secondary | ICD-10-CM

## 2018-07-17 DIAGNOSIS — E785 Hyperlipidemia, unspecified: Secondary | ICD-10-CM | POA: Diagnosis present

## 2018-07-17 DIAGNOSIS — K5730 Diverticulosis of large intestine without perforation or abscess without bleeding: Secondary | ICD-10-CM | POA: Diagnosis present

## 2018-07-17 DIAGNOSIS — J449 Chronic obstructive pulmonary disease, unspecified: Secondary | ICD-10-CM | POA: Diagnosis present

## 2018-07-17 DIAGNOSIS — Z7902 Long term (current) use of antithrombotics/antiplatelets: Secondary | ICD-10-CM

## 2018-07-17 DIAGNOSIS — Z89422 Acquired absence of other left toe(s): Secondary | ICD-10-CM

## 2018-07-17 DIAGNOSIS — I252 Old myocardial infarction: Secondary | ICD-10-CM

## 2018-07-17 DIAGNOSIS — Z801 Family history of malignant neoplasm of trachea, bronchus and lung: Secondary | ICD-10-CM

## 2018-07-17 DIAGNOSIS — R091 Pleurisy: Secondary | ICD-10-CM | POA: Diagnosis present

## 2018-07-17 DIAGNOSIS — K449 Diaphragmatic hernia without obstruction or gangrene: Secondary | ICD-10-CM | POA: Diagnosis present

## 2018-07-17 DIAGNOSIS — K5900 Constipation, unspecified: Secondary | ICD-10-CM | POA: Diagnosis present

## 2018-07-17 DIAGNOSIS — I2510 Atherosclerotic heart disease of native coronary artery without angina pectoris: Secondary | ICD-10-CM | POA: Diagnosis present

## 2018-07-17 DIAGNOSIS — Z87891 Personal history of nicotine dependence: Secondary | ICD-10-CM

## 2018-07-17 DIAGNOSIS — N179 Acute kidney failure, unspecified: Secondary | ICD-10-CM | POA: Diagnosis present

## 2018-07-17 DIAGNOSIS — Z91041 Radiographic dye allergy status: Secondary | ICD-10-CM

## 2018-07-17 DIAGNOSIS — Z955 Presence of coronary angioplasty implant and graft: Secondary | ICD-10-CM

## 2018-07-17 DIAGNOSIS — F1020 Alcohol dependence, uncomplicated: Secondary | ICD-10-CM | POA: Diagnosis present

## 2018-07-17 DIAGNOSIS — I10 Essential (primary) hypertension: Secondary | ICD-10-CM | POA: Diagnosis present

## 2018-07-17 DIAGNOSIS — R55 Syncope and collapse: Secondary | ICD-10-CM

## 2018-07-17 DIAGNOSIS — F259 Schizoaffective disorder, unspecified: Secondary | ICD-10-CM | POA: Diagnosis present

## 2018-07-17 DIAGNOSIS — R569 Unspecified convulsions: Secondary | ICD-10-CM | POA: Diagnosis present

## 2018-07-17 HISTORY — PX: LAPAROSCOPIC APPENDECTOMY: SHX408

## 2018-07-17 LAB — CBC WITH DIFFERENTIAL/PLATELET
Abs Immature Granulocytes: 0.04 10*3/uL (ref 0.00–0.07)
BASOS ABS: 0 10*3/uL (ref 0.0–0.1)
Basophils Relative: 0 %
EOS PCT: 1 %
Eosinophils Absolute: 0.1 10*3/uL (ref 0.0–0.5)
HCT: 33.9 % — ABNORMAL LOW (ref 39.0–52.0)
HEMOGLOBIN: 10.6 g/dL — AB (ref 13.0–17.0)
IMMATURE GRANULOCYTES: 1 %
LYMPHS ABS: 0.5 10*3/uL — AB (ref 0.7–4.0)
LYMPHS PCT: 7 %
MCH: 25.9 pg — ABNORMAL LOW (ref 26.0–34.0)
MCHC: 31.3 g/dL (ref 30.0–36.0)
MCV: 82.9 fL (ref 80.0–100.0)
MONOS PCT: 4 %
Monocytes Absolute: 0.3 10*3/uL (ref 0.1–1.0)
NEUTROS ABS: 6.9 10*3/uL (ref 1.7–7.7)
Neutrophils Relative %: 87 %
Platelets: 370 10*3/uL (ref 150–400)
RBC: 4.09 MIL/uL — ABNORMAL LOW (ref 4.22–5.81)
RDW: 14.7 % (ref 11.5–15.5)
WBC: 7.9 10*3/uL (ref 4.0–10.5)
nRBC: 0 % (ref 0.0–0.2)

## 2018-07-17 LAB — COMPREHENSIVE METABOLIC PANEL
ALT: 14 U/L (ref 0–44)
ANION GAP: 8 (ref 5–15)
AST: 21 U/L (ref 15–41)
Albumin: 3.9 g/dL (ref 3.5–5.0)
Alkaline Phosphatase: 50 U/L (ref 38–126)
BUN: 12 mg/dL (ref 8–23)
CHLORIDE: 94 mmol/L — AB (ref 98–111)
CO2: 25 mmol/L (ref 22–32)
Calcium: 9.1 mg/dL (ref 8.9–10.3)
Creatinine, Ser: 1.78 mg/dL — ABNORMAL HIGH (ref 0.61–1.24)
GFR calc Af Amer: 46 mL/min — ABNORMAL LOW (ref >60)
GFR calc non Af Amer: 40 mL/min — ABNORMAL LOW (ref >60)
Glucose, Bld: 102 mg/dL — ABNORMAL HIGH (ref 70–99)
POTASSIUM: 3.7 mmol/L (ref 3.5–5.1)
SODIUM: 127 mmol/L — AB (ref 135–145)
Total Bilirubin: 0.7 mg/dL (ref 0.3–1.2)
Total Protein: 6.4 g/dL — ABNORMAL LOW (ref 6.5–8.1)

## 2018-07-17 LAB — PROTIME-INR
INR: 0.86
Prothrombin Time: 11.7 seconds (ref 11.4–15.2)

## 2018-07-17 LAB — MRSA PCR SCREENING: MRSA by PCR: NEGATIVE

## 2018-07-17 LAB — LACTIC ACID, PLASMA: LACTIC ACID, VENOUS: 1 mmol/L (ref 0.5–1.9)

## 2018-07-17 LAB — AMMONIA: Ammonia: 18 umol/L (ref 9–35)

## 2018-07-17 LAB — TROPONIN I

## 2018-07-17 SURGERY — APPENDECTOMY, LAPAROSCOPIC
Anesthesia: General

## 2018-07-17 MED ORDER — SIMVASTATIN 40 MG PO TABS
40.0000 mg | ORAL_TABLET | Freq: Every day | ORAL | Status: DC
  Administered 2018-07-17 – 2018-07-19 (×3): 40 mg via ORAL
  Filled 2018-07-17 (×4): qty 1

## 2018-07-17 MED ORDER — TAMSULOSIN HCL 0.4 MG PO CAPS
0.4000 mg | ORAL_CAPSULE | Freq: Every day | ORAL | Status: DC
  Administered 2018-07-17 – 2018-07-18 (×2): 0.4 mg via ORAL
  Filled 2018-07-17 (×4): qty 1

## 2018-07-17 MED ORDER — SUCCINYLCHOLINE CHLORIDE 20 MG/ML IJ SOLN
INTRAMUSCULAR | Status: DC | PRN
  Administered 2018-07-17: 100 mg via INTRAVENOUS

## 2018-07-17 MED ORDER — SODIUM CHLORIDE 0.9 % IV BOLUS
1000.0000 mL | Freq: Once | INTRAVENOUS | Status: AC
  Administered 2018-07-17: 1000 mL via INTRAVENOUS

## 2018-07-17 MED ORDER — FAMOTIDINE 20 MG PO TABS
20.0000 mg | ORAL_TABLET | Freq: Two times a day (BID) | ORAL | Status: DC
  Administered 2018-07-17 – 2018-07-20 (×4): 20 mg via ORAL
  Filled 2018-07-17 (×7): qty 1

## 2018-07-17 MED ORDER — PROPOFOL 500 MG/50ML IV EMUL
INTRAVENOUS | Status: AC
  Filled 2018-07-17: qty 50

## 2018-07-17 MED ORDER — DEXAMETHASONE SODIUM PHOSPHATE 10 MG/ML IJ SOLN
INTRAMUSCULAR | Status: DC | PRN
  Administered 2018-07-17: 10 mg via INTRAVENOUS

## 2018-07-17 MED ORDER — METRONIDAZOLE IN NACL 5-0.79 MG/ML-% IV SOLN
500.0000 mg | Freq: Three times a day (TID) | INTRAVENOUS | Status: DC
  Administered 2018-07-17 – 2018-07-19 (×6): 500 mg via INTRAVENOUS
  Filled 2018-07-17 (×8): qty 100

## 2018-07-17 MED ORDER — ROCURONIUM BROMIDE 100 MG/10ML IV SOLN
INTRAVENOUS | Status: DC | PRN
  Administered 2018-07-17: 30 mg via INTRAVENOUS

## 2018-07-17 MED ORDER — ONDANSETRON HCL 4 MG/2ML IJ SOLN
INTRAMUSCULAR | Status: DC | PRN
  Administered 2018-07-17: 4 mg via INTRAVENOUS

## 2018-07-17 MED ORDER — PAROXETINE HCL 20 MG PO TABS
20.0000 mg | ORAL_TABLET | Freq: Every day | ORAL | Status: DC
  Administered 2018-07-17 – 2018-07-19 (×3): 20 mg via ORAL
  Filled 2018-07-17 (×4): qty 1

## 2018-07-17 MED ORDER — BUPIVACAINE-EPINEPHRINE (PF) 0.5% -1:200000 IJ SOLN
INTRAMUSCULAR | Status: AC
  Filled 2018-07-17: qty 30

## 2018-07-17 MED ORDER — ONDANSETRON HCL 4 MG/2ML IJ SOLN
INTRAMUSCULAR | Status: AC
  Filled 2018-07-17: qty 2

## 2018-07-17 MED ORDER — EPHEDRINE SULFATE 50 MG/ML IJ SOLN
INTRAMUSCULAR | Status: DC | PRN
  Administered 2018-07-17: 10 mg via INTRAVENOUS

## 2018-07-17 MED ORDER — FENTANYL CITRATE (PF) 100 MCG/2ML IJ SOLN
INTRAMUSCULAR | Status: AC
  Filled 2018-07-17: qty 2

## 2018-07-17 MED ORDER — FENTANYL CITRATE (PF) 100 MCG/2ML IJ SOLN
INTRAMUSCULAR | Status: DC | PRN
  Administered 2018-07-17 (×2): 50 ug via INTRAVENOUS

## 2018-07-17 MED ORDER — ISOSORBIDE MONONITRATE ER 30 MG PO TB24
30.0000 mg | ORAL_TABLET | Freq: Two times a day (BID) | ORAL | Status: DC
  Administered 2018-07-17 – 2018-07-20 (×6): 30 mg via ORAL
  Filled 2018-07-17 (×6): qty 1

## 2018-07-17 MED ORDER — ENOXAPARIN SODIUM 40 MG/0.4ML ~~LOC~~ SOLN
40.0000 mg | SUBCUTANEOUS | Status: DC
  Administered 2018-07-18: 40 mg via SUBCUTANEOUS
  Filled 2018-07-17: qty 0.4

## 2018-07-17 MED ORDER — FENOFIBRATE 160 MG PO TABS
160.0000 mg | ORAL_TABLET | Freq: Every day | ORAL | Status: DC
  Administered 2018-07-17 – 2018-07-20 (×4): 160 mg via ORAL
  Filled 2018-07-17 (×4): qty 1

## 2018-07-17 MED ORDER — SUGAMMADEX SODIUM 200 MG/2ML IV SOLN
INTRAVENOUS | Status: AC
  Filled 2018-07-17: qty 6

## 2018-07-17 MED ORDER — FUROSEMIDE 20 MG PO TABS
20.0000 mg | ORAL_TABLET | Freq: Two times a day (BID) | ORAL | Status: DC
  Administered 2018-07-17 – 2018-07-18 (×3): 20 mg via ORAL
  Filled 2018-07-17 (×3): qty 1

## 2018-07-17 MED ORDER — TIMOLOL MALEATE 0.5 % OP SOLN
1.0000 [drp] | Freq: Every day | OPHTHALMIC | Status: DC
  Administered 2018-07-18 – 2018-07-20 (×3): 1 [drp] via OPHTHALMIC
  Filled 2018-07-17: qty 5

## 2018-07-17 MED ORDER — FENTANYL CITRATE (PF) 100 MCG/2ML IJ SOLN: 25.0000 ug | INTRAMUSCULAR | Status: DC | PRN

## 2018-07-17 MED ORDER — PHENYLEPHRINE HCL 10 MG/ML IJ SOLN
INTRAMUSCULAR | Status: DC | PRN
  Administered 2018-07-17 (×3): 200 ug via INTRAVENOUS
  Administered 2018-07-17: 150 ug via INTRAVENOUS
  Administered 2018-07-17: 100 ug via INTRAVENOUS

## 2018-07-17 MED ORDER — SODIUM CHLORIDE 0.9 % IV SOLN
1.0000 g | Freq: Once | INTRAVENOUS | Status: AC
  Administered 2018-07-17: 1 g via INTRAVENOUS
  Filled 2018-07-17: qty 10

## 2018-07-17 MED ORDER — ACETAMINOPHEN 325 MG PO TABS
650.0000 mg | ORAL_TABLET | ORAL | Status: DC | PRN
  Administered 2018-07-17 – 2018-07-19 (×6): 650 mg via ORAL
  Filled 2018-07-17 (×6): qty 2

## 2018-07-17 MED ORDER — MAGNESIUM HYDROXIDE 400 MG/5ML PO SUSP
30.0000 mL | Freq: Every day | ORAL | Status: DC | PRN
  Filled 2018-07-17: qty 30

## 2018-07-17 MED ORDER — HYDROCODONE-ACETAMINOPHEN 5-325 MG PO TABS
1.0000 | ORAL_TABLET | Freq: Four times a day (QID) | ORAL | Status: DC | PRN
  Administered 2018-07-17 – 2018-07-18 (×5): 1 via ORAL
  Filled 2018-07-17 (×5): qty 1

## 2018-07-17 MED ORDER — ONDANSETRON 4 MG PO TBDP
4.0000 mg | ORAL_TABLET | Freq: Four times a day (QID) | ORAL | Status: DC | PRN
  Administered 2018-07-17: 4 mg via ORAL
  Filled 2018-07-17: qty 1

## 2018-07-17 MED ORDER — PROPOFOL 10 MG/ML IV BOLUS
INTRAVENOUS | Status: DC | PRN
  Administered 2018-07-17: 160 mg via INTRAVENOUS

## 2018-07-17 MED ORDER — DEXAMETHASONE SODIUM PHOSPHATE 10 MG/ML IJ SOLN
INTRAMUSCULAR | Status: AC
  Filled 2018-07-17: qty 1

## 2018-07-17 MED ORDER — ALUM & MAG HYDROXIDE-SIMETH 200-200-20 MG/5ML PO SUSP: 30.0000 mL | Freq: Three times a day (TID) | ORAL | Status: DC | PRN

## 2018-07-17 MED ORDER — CLOZAPINE 100 MG PO TABS
200.0000 mg | ORAL_TABLET | Freq: Every day | ORAL | Status: DC
  Administered 2018-07-17 – 2018-07-18 (×2): 200 mg via ORAL
  Administered 2018-07-19: 100 mg via ORAL
  Filled 2018-07-17 (×4): qty 2

## 2018-07-17 MED ORDER — METRONIDAZOLE IN NACL 5-0.79 MG/ML-% IV SOLN
500.0000 mg | Freq: Once | INTRAVENOUS | Status: AC
  Administered 2018-07-17: 500 mg via INTRAVENOUS
  Filled 2018-07-17: qty 100

## 2018-07-17 MED ORDER — SENNA 8.6 MG PO TABS: 1.0000 | ORAL_TABLET | Freq: Every day | ORAL | Status: DC | PRN

## 2018-07-17 MED ORDER — BUPROPION HCL ER (SR) 100 MG PO TB12
100.0000 mg | ORAL_TABLET | Freq: Every day | ORAL | Status: DC
  Administered 2018-07-17 – 2018-07-20 (×4): 100 mg via ORAL
  Filled 2018-07-17 (×4): qty 1

## 2018-07-17 MED ORDER — LACTATED RINGERS IV SOLN
INTRAVENOUS | Status: DC
  Administered 2018-07-17 – 2018-07-18 (×2): via INTRAVENOUS

## 2018-07-17 MED ORDER — CARVEDILOL 25 MG PO TABS
25.0000 mg | ORAL_TABLET | Freq: Two times a day (BID) | ORAL | Status: DC
  Administered 2018-07-17 – 2018-07-20 (×6): 25 mg via ORAL
  Filled 2018-07-17 (×6): qty 1

## 2018-07-17 MED ORDER — MIDAZOLAM HCL 2 MG/2ML IJ SOLN
INTRAMUSCULAR | Status: AC
  Filled 2018-07-17: qty 2

## 2018-07-17 MED ORDER — LIDOCAINE HCL (CARDIAC) PF 100 MG/5ML IV SOSY
PREFILLED_SYRINGE | INTRAVENOUS | Status: DC | PRN
  Administered 2018-07-17: 100 mg via INTRAVENOUS

## 2018-07-17 MED ORDER — VASOPRESSIN 20 UNIT/ML IV SOLN
INTRAVENOUS | Status: AC
  Filled 2018-07-17: qty 1

## 2018-07-17 MED ORDER — SUCRALFATE 1 GM/10ML PO SUSP
1.0000 g | Freq: Three times a day (TID) | ORAL | Status: DC
  Administered 2018-07-17 – 2018-07-18 (×6): 1 g via ORAL
  Filled 2018-07-17 (×6): qty 10

## 2018-07-17 MED ORDER — HYDROXYZINE HCL 25 MG PO TABS: 25.0000 mg | ORAL_TABLET | Freq: Two times a day (BID) | ORAL | Status: DC | PRN

## 2018-07-17 MED ORDER — FLUTICASONE PROPIONATE 50 MCG/ACT NA SUSP
2.0000 | Freq: Every day | NASAL | Status: DC
  Administered 2018-07-17 – 2018-07-20 (×4): 2 via NASAL
  Filled 2018-07-17: qty 16

## 2018-07-17 MED ORDER — NITROGLYCERIN 0.4 MG SL SUBL: 0.4000 mg | SUBLINGUAL_TABLET | SUBLINGUAL | Status: DC | PRN

## 2018-07-17 MED ORDER — ROCURONIUM BROMIDE 50 MG/5ML IV SOLN
INTRAVENOUS | Status: AC
  Filled 2018-07-17: qty 1

## 2018-07-17 MED ORDER — DOCUSATE SODIUM 100 MG PO CAPS: 100.0000 mg | ORAL_CAPSULE | Freq: Two times a day (BID) | ORAL | Status: DC | PRN

## 2018-07-17 MED ORDER — LACTATED RINGERS IV SOLN
INTRAVENOUS | Status: DC | PRN
  Administered 2018-07-17 (×2): via INTRAVENOUS

## 2018-07-17 MED ORDER — LATANOPROST 0.005 % OP SOLN
1.0000 [drp] | Freq: Every day | OPHTHALMIC | Status: DC
  Administered 2018-07-17 – 2018-07-19 (×3): 1 [drp] via OPHTHALMIC
  Filled 2018-07-17: qty 2.5

## 2018-07-17 MED ORDER — SODIUM CHLORIDE 0.9 % IV SOLN
2.0000 g | INTRAVENOUS | Status: DC
  Administered 2018-07-17 – 2018-07-18 (×2): 2 g via INTRAVENOUS
  Filled 2018-07-17: qty 2
  Filled 2018-07-17: qty 20
  Filled 2018-07-17: qty 2

## 2018-07-17 MED ORDER — PROPOFOL 10 MG/ML IV BOLUS
INTRAVENOUS | Status: AC
  Filled 2018-07-17: qty 20

## 2018-07-17 MED ORDER — CELECOXIB 200 MG PO CAPS
200.0000 mg | ORAL_CAPSULE | Freq: Every day | ORAL | Status: DC
  Administered 2018-07-17 – 2018-07-18 (×2): 200 mg via ORAL
  Filled 2018-07-17 (×3): qty 1

## 2018-07-17 MED ORDER — SUGAMMADEX SODIUM 200 MG/2ML IV SOLN
INTRAVENOUS | Status: DC | PRN
  Administered 2018-07-17: 200 mg via INTRAVENOUS

## 2018-07-17 MED ORDER — BUPIVACAINE-EPINEPHRINE 0.5% -1:200000 IJ SOLN
INTRAMUSCULAR | Status: DC | PRN
  Administered 2018-07-17: 9 mL

## 2018-07-17 MED ORDER — MOMETASONE FURO-FORMOTEROL FUM 100-5 MCG/ACT IN AERO
2.0000 | INHALATION_SPRAY | Freq: Two times a day (BID) | RESPIRATORY_TRACT | Status: DC
  Administered 2018-07-17 – 2018-07-20 (×6): 2 via RESPIRATORY_TRACT
  Filled 2018-07-17: qty 8.8

## 2018-07-17 MED ORDER — VASOPRESSIN 20 UNIT/ML IV SOLN
INTRAVENOUS | Status: DC | PRN
  Administered 2018-07-17: 2 [IU] via INTRAVENOUS
  Administered 2018-07-17: 1 [IU] via INTRAVENOUS
  Administered 2018-07-17: 2 [IU] via INTRAVENOUS
  Administered 2018-07-17: 1 [IU] via INTRAVENOUS
  Administered 2018-07-17: 2 [IU] via INTRAVENOUS
  Administered 2018-07-17: 1 [IU] via INTRAVENOUS
  Administered 2018-07-17: 2 [IU] via INTRAVENOUS

## 2018-07-17 MED ORDER — OXYCODONE HCL 5 MG/5ML PO SOLN: 5.0000 mg | Freq: Once | ORAL | Status: DC | PRN

## 2018-07-17 MED ORDER — EPHEDRINE SULFATE 50 MG/ML IJ SOLN
INTRAMUSCULAR | Status: AC
  Filled 2018-07-17: qty 1

## 2018-07-17 MED ORDER — EZETIMIBE 10 MG PO TABS
10.0000 mg | ORAL_TABLET | Freq: Every day | ORAL | Status: DC
  Administered 2018-07-17 – 2018-07-19 (×3): 10 mg via ORAL
  Filled 2018-07-17 (×4): qty 1

## 2018-07-17 MED ORDER — LIDOCAINE HCL (PF) 2 % IJ SOLN
INTRAMUSCULAR | Status: AC
  Filled 2018-07-17: qty 10

## 2018-07-17 MED ORDER — PANTOPRAZOLE SODIUM 40 MG PO TBEC
40.0000 mg | DELAYED_RELEASE_TABLET | Freq: Two times a day (BID) | ORAL | Status: DC
  Administered 2018-07-17 – 2018-07-20 (×6): 40 mg via ORAL
  Filled 2018-07-17 (×7): qty 1

## 2018-07-17 MED ORDER — PHENYLEPHRINE HCL 10 MG/ML IJ SOLN
INTRAMUSCULAR | Status: AC
  Filled 2018-07-17: qty 1

## 2018-07-17 MED ORDER — OXYCODONE HCL 5 MG PO TABS: 5.0000 mg | ORAL_TABLET | Freq: Once | ORAL | Status: DC | PRN

## 2018-07-17 SURGICAL SUPPLY — 38 items
BLADE SURG 15 STRL LF DISP TIS (BLADE) ×1 IMPLANT
BLADE SURG 15 STRL SS (BLADE) ×2
BLADE SURG SZ11 CARB STEEL (BLADE) ×3 IMPLANT
CANISTER SUCT 1200ML W/VALVE (MISCELLANEOUS) ×3 IMPLANT
CANNULA DILATOR 10 W/SLV (CANNULA) ×2 IMPLANT
CANNULA DILATOR 10MM W/SLV (CANNULA) ×1
COVER WAND RF STERILE (DRAPES) IMPLANT
CUTTER FLEX LINEAR 45M (STAPLE) ×3 IMPLANT
DERMABOND ADVANCED (GAUZE/BANDAGES/DRESSINGS) ×2
DERMABOND ADVANCED .7 DNX12 (GAUZE/BANDAGES/DRESSINGS) ×1 IMPLANT
ELECT REM PT RETURN 9FT ADLT (ELECTROSURGICAL) ×3
ELECTRODE REM PT RTRN 9FT ADLT (ELECTROSURGICAL) ×1 IMPLANT
GLOVE BIOGEL PI IND STRL 7.0 (GLOVE) ×1 IMPLANT
GLOVE BIOGEL PI INDICATOR 7.0 (GLOVE) ×2
GLOVE SURG SYN 7.0 (GLOVE) ×9 IMPLANT
GOWN STRL REUS W/ TWL LRG LVL3 (GOWN DISPOSABLE) ×1 IMPLANT
GOWN STRL REUS W/TWL LRG LVL3 (GOWN DISPOSABLE) ×2
GRASPER SUT TROCAR 14GX15 (MISCELLANEOUS) ×3 IMPLANT
HANDLE YANKAUER SUCT BULB TIP (MISCELLANEOUS) ×3 IMPLANT
IRRIGATION STRYKERFLOW (MISCELLANEOUS) ×1 IMPLANT
IRRIGATOR STRYKERFLOW (MISCELLANEOUS) ×3
IV NS 1000ML (IV SOLUTION) ×2
IV NS 1000ML BAXH (IV SOLUTION) ×1 IMPLANT
KIT TURNOVER KIT A (KITS) ×3 IMPLANT
LIGASURE LAP MARYLAND 5MM 37CM (ELECTROSURGICAL) IMPLANT
NEEDLE HYPO 22GX1.5 SAFETY (NEEDLE) ×3 IMPLANT
NEEDLE VERESS 14GA 120MM (NEEDLE) ×3 IMPLANT
PACK LAP CHOLECYSTECTOMY (MISCELLANEOUS) ×3 IMPLANT
POUCH ENDO CATCH 10MM SPEC (MISCELLANEOUS) ×3 IMPLANT
RELOAD 45 VASCULAR/THIN (ENDOMECHANICALS) IMPLANT
RELOAD STAPLE TA45 3.5 REG BLU (ENDOMECHANICALS) ×3 IMPLANT
SCISSORS METZENBAUM CVD 33 (INSTRUMENTS) ×3 IMPLANT
SUT MNCRL AB 4-0 PS2 18 (SUTURE) ×3 IMPLANT
SUT VICRYL PLUS ABS 0 54 (SUTURE) ×3 IMPLANT
TRAY FOLEY MTR SLVR 16FR STAT (SET/KITS/TRAYS/PACK) IMPLANT
TROCAR XCEL 12X100 BLDLESS (ENDOMECHANICALS) ×3 IMPLANT
TROCAR XCEL NON-BLD 5MMX100MML (ENDOMECHANICALS) ×6 IMPLANT
TUBING INSUFFLATION (TUBING) ×3 IMPLANT

## 2018-07-17 NOTE — ED Provider Notes (Signed)
[facility] Emergency Department Provider Note  ____________________________________________   First MD Initiated Contact with Patient 07/17/18 [phone]     (approximate)  I have reviewed the triage vital signs and the nursing notes.   HISTORY  Chief Complaint Loss of Consciousness and Constipation   HPI Brandon [name] is a 62 y.o. male comes to the emergency department via EMS after a syncopal episode at home.  He says that he has been constipated recently and has had about 24 hours of cramping diffuse abdominal discomfort.  He got up to try to go to the bathroom however he felt "dizzy" and "lightheaded" and was unable to actually walk so he crawled towards the bathroom and he passed out along the way.  He never actually made it to the bathroom and did not attempt to have a bowel movement.  He denies fevers or chills.  He does report some nausea but no vomiting.  He has had flatus today but no bowel movement.  About 2 months ago he did have a cholecystectomy performed although no other abdominal surgeries.  His symptoms today seem to be acutely worsened when standing up and somewhat improved when lying flat.  Past Medical History:  Diagnosis Date  . Anemia    IDA  . Colon polyps   . COPD (chronic obstructive pulmonary disease) (HCC)   . Coronary artery disease   . Heart attack (HCC) 2005  . Hiatal hernia   . History of ETOH abuse   . Hyperlipidemia   . Hypertension   . Pleurisy   . Schizophrenia (HCC)   . Schizophrenia (HCC)   . Seizures (HCC)    grand mal in 2000 or 2001 recovery from alcoholism  . Suicide attempt [facility])     Patient Active Problem List   Diagnosis Date Noted  . Acute cholecystitis 04/10/2018  . Chronic cholecystitis 04/08/2018  . Atherosclerosis of native arteries of extremity with intermittent claudication (HCC) 11/22/2017  . Essential hypertension 11/22/2017  . Hyperlipidemia 11/22/2017  . COPD (chronic obstructive pulmonary  disease) (HCC) 11/22/2017  . CAD (coronary artery disease) 10/15/2017    Past Surgical History:  Procedure Laterality Date  . ABDOMINAL SURGERY     internal bleeding  . CHOLECYSTECTOMY N/A 05/04/2018   Procedure: LAPAROSCOPIC CHOLECYSTECTOMY, converted to open;  Surgeon: Sung Amabile, DO;  Location: [facility] ORS;  Service: General;  Laterality: N/A;  . COLONOSCOPY    . COLONOSCOPY WITH PROPOFOL N/A 05/09/2016   Procedure: COLONOSCOPY WITH PROPOFOL;  Surgeon: Scot Jun, MD;  Location: [facility] ENDOSCOPY;  Service: Endoscopy;  Laterality: N/A;  . CORONARY ANGIOPLASTY WITH STENT PLACEMENT     1 vessel  . ESOPHAGOGASTRODUODENOSCOPY (EGD) WITH PROPOFOL N/A 05/09/2016   Procedure: ESOPHAGOGASTRODUODENOSCOPY (EGD) WITH PROPOFOL;  Surgeon: Scot Jun, MD;  Location: [facility] ENDOSCOPY;  Service: Endoscopy;  Laterality: N/A;  . EYE SURGERY Right   . GLAUCOMA SURGERY    . LEFT HEART CATH Right 10/19/2017   Procedure: Left Heart Cath and Coronary Angiography;  Surgeon: Laurier Nancy, MD;  Location: [facility] INVASIVE CV LAB;  Service: Cardiovascular;  Laterality: Right;  . NOSE SURGERY    . 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 VASECTOMY      Prior to Admission medications   Medication Sig Start Date End Date Taking? Authorizing Provider  acetaminophen (TYLENOL) 325 MG tablet Take 650 mg by mouth every 4 (four) hours as needed.   Yes [provider]  buPROPion [facility]  SR) 100 MG 12 hr tablet Take 100 mg by mouth daily.    Yes [provider]  carvedilol (COREG) 25 MG tablet Take 25 mg by mouth 2 (two) times daily with a meal.   Yes [provider]  cloZAPine (CLOZARIL) 100 MG tablet Take 200 mg by mouth at bedtime.    Yes [provider]  docusate sodium (COLACE) 100 MG capsule Take 1 tablet once or twice daily as needed for constipation while taking narcotic pain medicine Patient taking differently: Take 100 mg by mouth daily.  04/23/18  Yes Loleta RoseForbach, Cory, MD    ezetimibe (ZETIA) 10 MG tablet Take 10 mg by mouth at bedtime.    Yes [provider]  famotidine (PEPCID) 20 MG tablet Take 1 tablet (20 mg total) by mouth 2 (two) times daily. 06/15/18  Yes Sharman CheekStafford, Phillip, MD  fenofibrate 160 MG tablet Take 160 mg by mouth daily.   Yes [provider]  fluticasone (FLONASE) 50 MCG/ACT nasal spray Place 2 sprays into both nostrils daily.    Yes [provider]  furosemide (LASIX) 20 MG tablet Take 20 mg by mouth 2 (two) times daily.   Yes [provider]  HYDROcodone-acetaminophen (NORCO/VICODIN) 5-325 MG tablet Take 1 tablet by mouth every 6 (six) hours as needed. 06/23/18  Yes [provider]  hydrOXYzine (VISTARIL) 25 MG capsule Take 25 mg by mouth 2 (two) times daily as needed for anxiety.   Yes [provider]  ibuprofen (ADVIL,MOTRIN) 800 MG tablet Take 1 tablet (800 mg total) by mouth every 8 (eight) hours as needed for mild pain or moderate pain (for mild pain not relieved by other medications.). Patient taking differently: Take 400 mg by mouth every 8 (eight) hours as needed for mild pain or moderate pain (for mild pain not relieved by other medications.).  05/07/18  Yes Sakai, Isami, DO  isosorbide mononitrate (IMDUR) 30 MG 24 hr tablet Take 30 mg by mouth 2 (two) times daily.    Yes [provider]  latanoprost (XALATAN) 0.005 % ophthalmic solution Place 1 drop into both eyes at bedtime. 09/15/17  Yes [provider]  magnesium hydroxide (MILK OF MAGNESIA) 400 MG/5ML suspension Take 30 mLs by mouth daily as needed for moderate constipation.    Yes [provider]  nitroGLYCERIN (NITROSTAT) 0.4 MG SL tablet Place 0.4 mg under the tongue every 5 (five) minutes as needed for chest pain.    Yes [provider]  ondansetron (ZOFRAN-ODT) 4 MG disintegrating tablet Take 1 tablet (4 mg total) by mouth every 6 (six) hours as needed for nausea. 04/12/18  Yes Carolan Shiverintron-Diaz, Edgardo,  MD  pantoprazole (PROTONIX) 40 MG tablet Take 40 mg by mouth 2 (two) times daily.   Yes [provider]  PARoxetine (PAXIL) 20 MG tablet Take 20 mg by mouth daily.   Yes [provider]  senna (SENOKOT) 8.6 MG tablet Take 1-2 tablets by mouth daily as needed for constipation.   Yes [provider]  simvastatin (ZOCOR) 40 MG tablet Take 40 mg by mouth at bedtime.    Yes [provider]  sucralfate (CARAFATE) 1 GM/10ML suspension Take 10 mLs by mouth 4 (four) times daily -  before meals and at bedtime. 07/12/18 07/12/19 Yes [provider]  SYMBICORT 80-4.5 MCG/ACT inhaler Inhale 2 puffs into the lungs 2 (two) times daily. 09/15/17  Yes [provider]  tamsulosin (FLOMAX) 0.4 MG CAPS capsule Take 0.4 mg by mouth daily.  Yes [provider]  timolol (TIMOPTIC) 0.5 % ophthalmic solution Place 1 drop into both eyes daily. 09/15/17  Yes [provider]  aluminum-magnesium hydroxide-simethicone (MAALOX) 200-200-20 MG/5ML SUSP Take 30 mLs by mouth 4 (four) times daily -  before meals and at bedtime. Patient not taking: Reported on 07/17/2018 06/15/18   Sharman Cheek, MD  amLODipine (NORVASC) 5 MG tablet Take 5 mg by mouth daily.    [provider]  metoprolol succinate (TOPROL-XL) 25 MG 24 hr tablet Take 25 mg by mouth daily.    [provider]    Allergies Compazine [prochlorperazine]; Ivp dye [iodinated diagnostic agents]; Alcohol-sulfur [sulfur]; Benadryl [diphenhydramine]; Depakote [divalproex sodium]; Dramamine [dimenhydrinate]; and Plavix [clopidogrel]  Family History  Problem Relation Age of Onset  . Lung cancer Mother   . Hypertension Father   . Heart attack Father   . CAD Father   . Prostate cancer Neg Hx   . Bladder Cancer Neg Hx   . Kidney cancer Neg Hx     Social History Social History   Tobacco Use  . Smoking status: Former Smoker    Packs/day: 1.00    Types: Cigarettes    Last attempt to  quit: 2012    Years since quitting: 7.9  . Smokeless tobacco: Former Engineer, water Use Topics  . Alcohol use: No    Comment: no alcohol since 2010  . Drug use: No    Review of Systems Constitutional: No fever/chills Eyes: No visual changes. ENT: No sore throat. Cardiovascular: Denies chest pain. Respiratory: Denies shortness of breath. Gastrointestinal: Positive for abdominal pain.  Positive for nausea, no vomiting.  No diarrhea.  Positive for constipation. Genitourinary: Negative for dysuria. Musculoskeletal: Negative for back pain. Skin: Negative for rash. Neurological: Negative for headaches, focal weakness or numbness.   ____________________________________________   PHYSICAL EXAM:  VITAL SIGNS: ED Triage Vitals  Enc Vitals Group     BP 07/17/18 0445 (!) 83/54     Pulse Rate 07/17/18 0445 91     Resp 07/17/18 0445 18     Temp 07/17/18 0445 98.3 F (36.8 C)     Temp Source 07/17/18 0445 Oral     SpO2 07/17/18 0445 99 %     Weight 07/17/18 0446 200 lb (90.7 kg)     Height 07/17/18 0446 6' (1.829 m)     Head Circumference --      Peak Flow --      Pain Score 07/17/18 0445 6     Pain Loc --      Pain Edu? --      Excl. in GC? --     Constitutional: Alert and oriented x4 appears somewhat uncomfortable although nontoxic no diaphoresis Eyes: PERRL EOMI. no nystagmus appreciated Head: Atraumatic. Nose: No congestion/rhinnorhea. Mouth/Throat: No trismus Neck: No stridor.   Cardiovascular: Normal rate, regular rhythm. Grossly normal heart sounds.  Good peripheral circulation. Respiratory: Normal respiratory effort.  No retractions. Lungs CTAB and moving good air Gastrointestinal: No frank peritonitis but exquisitely tender diffusely.  He does have some rebound and guarding.  Abdominal exam is nonlocalizing.  No McBurney's tenderness negative Rovsing's Musculoskeletal: No lower extremity edema   Neurologic:  Normal speech and language. No gross focal neurologic  deficits are appreciated. Skin:  Skin is warm, dry and intact. No rash noted. Psychiatric: Mood and affect are normal. Speech and behavior are normal.    ____________________________________________   DIFFERENTIAL includes but not limited to  Cardiogenic syncope, vasovagal syncope, dehydration,  bowel obstruction, constipation, diverticulitis, appendicitis ____________________________________________   LABS (all labs ordered are listed, but only abnormal results are displayed)  Labs Reviewed  COMPREHENSIVE METABOLIC PANEL - Abnormal; Notable for the following components:      Result Value   Sodium 127 (*)    Chloride 94 (*)    Glucose, Bld 102 (*)    Creatinine, Ser 1.78 (*)    Total Protein 6.4 (*)    GFR calc non Af Amer 40 (*)    GFR calc Af Amer 46 (*)    All other components within normal limits  CBC WITH DIFFERENTIAL/PLATELET - Abnormal; Notable for the following components:   RBC 4.09 (*)    Hemoglobin 10.6 (*)    HCT 33.9 (*)    MCH 25.9 (*)    Lymphs Abs 0.5 (*)    All other components within normal limits  PROTIME-INR  TROPONIN I  AMMONIA  LACTIC ACID, PLASMA  URINALYSIS, COMPLETE (UACMP) WITH MICROSCOPIC    Lab work reviewed by me shows hypochloremic hyponatremia and elevated creatinine consistent with acute kidney injury and dehydration. __________________________________________  EKG  ED ECG REPORT I, Merrily Brittle, the attending physician, personally viewed and interpreted this ECG.  Date: 07/17/2018 EKG Time:  Rate: 97 Rhythm: normal sinus rhythm QRS Axis: normal Intervals: normal ST/T Wave abnormalities: LVH with expected repolarization abnormalities.  No signs of acute ischemia Narrative Interpretation: no evidence of acute ischemia  ____________________________________________  RADIOLOGY  CT abdomen pelvis reviewed by me consistent with acute appendicitis ____________________________________________   PROCEDURES  Procedure(s)  performed: no  Procedures  Critical Care performed: no  ____________________________________________   INITIAL IMPRESSION / ASSESSMENT AND PLAN / ED COURSE  Pertinent labs & imaging results that were available during my care of the patient were reviewed by me and considered in my medical decision making (see chart for details).   As part of my medical decision making, I reviewed the following data within the electronic MEDICAL RECORD NUMBER History obtained from family if available, nursing notes, old chart and ekg, as well as notes from prior ED visits.  Patient comes to the emergency department with 2 issues.  First is abdominal pain nausea and constipation and the second is a syncopal episode.  His syncope is positional and had a prodrome of nausea and fatigue.  Does not sound cardiogenic.  His EKG shows LVH although no ischemic changes normal intervals and no blocks.  The patient is anaphylactic to IV contrast so in addition to blood work I will have to get a CT scan with no contrast.  IV fluids for resuscitation now and will keep him on monitor to evaluate for ectopy.  N.p.o.  The patient CT scan is concerning for acute appendicitis so I have begun him on ceftriaxone as well as Flagyl and I called Dr. Tonna Boehringer on-call for general surgery.  Conveniently Dr. Tonna Boehringer is actually the surgeon who removed the patient's gallbladder 2 months ago.  He has graciously agreed to admit the patient to his service.      ____________________________________________   FINAL CLINICAL IMPRESSION(S) / ED DIAGNOSES  Final diagnoses:  Acute appendicitis, unspecified acute appendicitis type  Dehydration  AKI (acute kidney injury) (HCC)  Syncope, unspecified syncope type      NEW MEDICATIONS STARTED DURING THIS VISIT:  New Prescriptions   No medications on file     Note:  This document was prepared using Dragon voice recognition software and may include unintentional dictation errors.  Merrily Brittle, MD 07/17/18 [phone]

## 2018-07-17 NOTE — H&P (Signed)
Subjective:   CC: appendicitis  HPI:  Brandon RheinRobert S Pense is a 62 y.o. male who is consulted by Rifenbach for evaluation of  above cc.  Well known patient to me.  Has been dealing with epigastric, RUQ pain on and off since opne chole two months ago.  Workup has been prolonged with no definitive cause of pain, DDx including biliary leak, peptic ulcer disease, etc.  Most recent treatment with carafate seemed to have resolved the symptoms.  Pt had syncopal episode last night and admitted to ED for workup.  Mentioned his abdominal pain and now noted to have appendictis, so I was called in.  Has been dealing with constipation issues, in addition to the pain.  Past Medical History:  has a past medical history of Anemia, Colon polyps, COPD (chronic obstructive pulmonary disease) (HCC), Coronary artery disease, Heart attack (HCC) (2005), Hiatal hernia, History of ETOH abuse, Hyperlipidemia, Hypertension, Pleurisy, Schizophrenia (HCC), Schizophrenia (HCC), Seizures (HCC), and Suicide attempt (HCC).  Past Surgical History:  has a past surgical history that includes Abdominal surgery; Toe amputation (Left); Colonoscopy; Nose surgery; Vasectomy; Colonoscopy with propofol (N/A, 05/09/2016); Esophagogastroduodenoscopy (egd) with propofol (N/A, 05/09/2016); Left Heart Cath (Right, 10/19/2017); Coronary angioplasty with stent; Glaucoma surgery; Eye surgery (Right); and Cholecystectomy (N/A, 05/04/2018).  Family History: family history includes CAD in his father; Heart attack in his father; Hypertension in his father; Lung cancer in his mother.  Social History:  reports that he quit smoking about 7 years ago. His smoking use included cigarettes. He smoked 1.00 pack per day. He has quit using smokeless tobacco. He reports that he does not drink alcohol or use drugs.  Current Medications: includes multiple psych meds, BP meds adjustments recently made.  Allergies:  Allergies as of 07/17/2018 - Review Complete 07/17/2018   Allergen Reaction Noted  . Compazine [prochlorperazine] Other (See Comments) 04/19/2015  . Ivp dye [iodinated diagnostic agents] Shortness Of Breath 06/05/2018  . Alcohol-sulfur [sulfur] Other (See Comments) 10/16/2017  . Benadryl [diphenhydramine] Other (See Comments) 04/19/2015  . Depakote [divalproex sodium] Other (See Comments) 05/08/2016  . Dramamine [dimenhydrinate] Swelling 04/19/2015  . Plavix [clopidogrel] Other (See Comments) 04/29/2018    ROS:  General: Denies weight loss, weight gain, fatigue, fevers, chills, and night sweats. Eyes: Denies blurry vision, double vision, eye pain, itchy eyes, and tearing. Ears: Denies hearing loss, earache, and ringing in ears. Nose: Denies sinus pain, congestion, infections, runny nose, and nosebleeds. Mouth/throat: Denies hoarseness, sore throat, bleeding gums, and difficulty swallowing. Heart: Denies chest pain, palpitations, racing heart, irregular heartbeat, leg pain or swelling, and decreased activity tolerance. Respiratory: Denies breathing difficulty, shortness of breath, wheezing, cough, and sputum. GI: Denies change in appetite, heartburn, nausea, vomiting, diarrhea, and blood in stool. GU: Denies difficulty urinating, pain with urinating, urgency, frequency, blood in urine, and heavy menstrual bleeding. Musculoskeletal: Denies joint stiffness, pain, swelling, muscle weakness, and pain. Skin: Denies rash, itching, mass, tumors, sores, and boils Neurologic: Denies headache, dizziness, seizures, numbness, and tingling. Psychiatric: Denies depression, anxiety, difficulty sleeping, and memory loss. Endocrine: Denies heat or cold intolerance, and increased thirst or urination. Blood/lymph: Denies easy bruising, easy bruising, and swollen glands  pertinent positives and negatives noted in HPI    Objective:     BP (!) 83/54   Pulse 91   Temp 98.3 F (36.8 C) (Oral)   Resp 18   Ht 6' (1.829 m)   Wt 90.7 kg   SpO2 99%   BMI 27.12  kg/m    Constitutional :  alert, cooperative, appears stated age and no distress  Lymphatics/Throat:  no asymmetry, masses, or scars  Respiratory:  clear to auscultation bilaterally  Cardiovascular:  regular rate and rhythm  Gastrointestinal: soft, no guarding but focal tenderness in RUQ area where his cecum and appendix lies on CT.   Musculoskeletal: Steady gait and movement  Skin: Cool and moist  Psychiatric: Normal affect, non-agitated, not confused       LABS:  CMP Latest Ref Rng & Units 07/17/2018 06/15/2018 06/05/2018  Glucose 70 - 99 mg/dL 161(W) 960(A) 540(J)  BUN 8 - 23 mg/dL 12 6(L) 9  Creatinine 8.11 - 1.24 mg/dL 9.14(N) 8.29(F) 6.21(H)  Sodium 135 - 145 mmol/L 127(L) 135 131(L)  Potassium 3.5 - 5.1 mmol/L 3.7 3.8 3.8  Chloride 98 - 111 mmol/L 94(L) 104 93(L)  CO2 22 - 32 mmol/L 25 24 26   Calcium 8.9 - 10.3 mg/dL 9.1 0.8(M) 57.8  Total Protein 6.5 - 8.1 g/dL 6.4(L) 5.9(L) 7.6  Total Bilirubin 0.3 - 1.2 mg/dL 0.7 0.4 0.8  Alkaline Phos 38 - 126 U/L 50 51 57  AST 15 - 41 U/L 21 18 18   ALT 0 - 44 U/L 14 12 14    CBC Latest Ref Rng & Units 07/17/2018 06/15/2018 06/05/2018  WBC 4.0 - 10.5 K/uL 7.9 7.5 14.1(H)  Hemoglobin 13.0 - 17.0 g/dL 10.6(L) 9.8(L) 12.6(L)  Hematocrit 39.0 - 52.0 % 33.9(L) 30.7(L) 39.3  Platelets 150 - 400 K/uL 370 527(H) 390     RADS: CLINICAL DATA:  62 year old male with constipation.  EXAM: CT ABDOMEN AND PELVIS WITHOUT CONTRAST  TECHNIQUE: Multidetector CT imaging of the abdomen and pelvis was performed following the standard protocol without IV contrast.  COMPARISON:  CT of the abdomen pelvis dated 06/15/2018  FINDINGS: Evaluation of this exam is limited in the absence of intravenous contrast.  Lower chest: The visualized lung bases are clear. There is coronary vascular calcification.  No intra-abdominal free air. There is diffuse mesenteric and omental stranding and small free fluid within the pelvis.  Hepatobiliary: The  liver is unremarkable. Cholecystectomy. There is apparent 2.5 x 3.5 cm fluid collection superior to the surgical suture in the region of the gallbladder fossa which appears to be contiguous with the cystic duct. This may represent a portion of the gallbladder or a seroma. An infected fluid collection or loculated bile is not entirely excluded. CT with oral and IV contrast may provide better evaluation. If there is clinical concern for bile leak further evaluation with HIDA scan recommended.  Pancreas: Unremarkable. No pancreatic ductal dilatation or surrounding inflammatory changes.  Spleen: Normal in size without focal abnormality.  Adrenals/Urinary Tract: The adrenal glands are unremarkable. There is no hydronephrosis or nephrolithiasis on either side. Right renal hypodense lesions measure up to 3 cm as seen previously not characterized on this noncontrast CT, possibly cysts. The visualized ureters appear unremarkable. Thickened appearance of the anterior bladder wall possibly related to partial underdistention or trabeculation. Correlation with urinalysis recommended to exclude cystitis.  Stomach/Bowel: There is moderate amount of stool throughout the colon. There are scattered colonic diverticula without active inflammatory changes. There is no bowel obstruction. The cecum is located in the midline upper abdomen inferior to the cholecystectomy sutures. The appendix is mildly enlarged measuring up to 1 cm in diameter. Inflammatory changes surrounding the appendix and cecum with infiltration of the omental fat. Findings concerning for acute appendicitis. Clinical correlation is recommended.  Vascular/Lymphatic: Advanced aortoiliac atherosclerotic disease. No portal venous gas. There is no  adenopathy.  Reproductive: The prostate and seminal vesicles are grossly unremarkable. No pelvic mass.  Other: None  Musculoskeletal: Degenerative changes of the spine. No acute  osseous pathology.  IMPRESSION: 1. Inflammatory changes surrounding the cecum and appendix in the upper abdomen with infiltration of the omental fat. Findings concerning for acute appendicitis and less likely related to postsurgical complication cholecystectomy. Clinical correlation is recommended. 2. Cholecystectomy. A small fluid structure superior to the surgical suture in the region of the gallbladder fossa may represent a portion of the gallbladder or a seroma. An infected fluid collection or loculated bile is not entirely excluded. CT with oral and IV contrast may provide better evaluation. If there is clinical concern for bile leak further evaluation with HIDA scan recommended. 3. Colonic diverticulosis. 4. Thickened appearance of the anterior bladder wall. Correlation with urinalysis recommended to exclude cystitis.   Electronically Signed   By: Elgie Collard M.D.   On: 07/17/2018 06:22 Assessment:      Acute appendicits  Plan:      Discussed the risk of surgery including post-op infxn, seroma, hematoma, abscess formation, chronic pain, poor-delayed wound healing, possible bowel resection, possible ostomy, possible conversion to open procedure, post-op SBO or ileus, and need for additional procedures to address said risks.  The risks of general anesthetic including MI, CVA, sudden death or even reaction to anesthetic medications also discussed. Alternatives include continued observation, or antibiotic treatment.  Benefits include possible symptom relief,   Typical post operative recovery of 3-5 days rest, also discussed.  The patient understands the risks, any and all questions were answered to the patient's satisfaction.  To the OR for appendectomy, possible open due to complicated surgical history in the past.  Pt verbalized understanding and still wishes to proceed

## 2018-07-17 NOTE — Anesthesia Postprocedure Evaluation (Signed)
Anesthesia Post Note  Patient: Simon RheinRobert S Christenson  Procedure(s) Performed: APPENDECTOMY LAPAROSCOPIC (N/A )  Patient location during evaluation: PACU Anesthesia Type: General Level of consciousness: awake and alert Pain management: pain level controlled Vital Signs Assessment: post-procedure vital signs reviewed and stable Respiratory status: spontaneous breathing, nonlabored ventilation, respiratory function stable and patient connected to nasal cannula oxygen Cardiovascular status: blood pressure returned to baseline and stable Postop Assessment: no apparent nausea or vomiting Anesthetic complications: no     Last Vitals:  Vitals:   07/17/18 1208 07/17/18 1223  BP: 116/72 109/68  Pulse: 87 88  Resp: 18 17  Temp:    SpO2: 100% 100%    Last Pain:  Vitals:   07/17/18 1223  TempSrc:   PainSc: 2                  Cleda MccreedyJoseph K Piscitello

## 2018-07-17 NOTE — Anesthesia Procedure Notes (Signed)
Procedure Name: Intubation Date/Time: 07/17/2018 10:23 AM Performed by: Rosaria FerriesPiscitello, Olamae Ferrara K, MD Pre-anesthesia Checklist: Patient identified, Patient being monitored, Timeout performed, Emergency Drugs available and Suction available Patient Re-evaluated:Patient Re-evaluated prior to induction Oxygen Delivery Method: Circle system utilized Preoxygenation: Pre-oxygenation with 100% oxygen Induction Type: IV induction Laryngoscope Size: Miller and 2 Grade View: Grade II Tube type: Oral Tube size: 7.5 mm Number of attempts: 1 Airway Equipment and Method: Stylet Placement Confirmation: ETT inserted through vocal cords under direct vision,  positive ETCO2 and breath sounds checked- equal and bilateral Secured at: 23 cm Tube secured with: Tape Dental Injury: Teeth and Oropharynx as per pre-operative assessment

## 2018-07-17 NOTE — ED Notes (Signed)
Spoke [place] Jasmine DecemberSharon, [place].  Consent form placed in pt's cubby as requested.

## 2018-07-17 NOTE — ED Triage Notes (Signed)
Patient brought in by ems from home. Patient states that he was getting up to the bathroom tonight and had a syncopal episode. Patient states that he is constipated. Patient states that his last BM was yesterday but states that he still feels like he needs to have a BM but not able to.

## 2018-07-17 NOTE — ED Notes (Signed)
Report called to Tiffany, RN who said I do not need to call her when we are on the way up to the unit with the pt.

## 2018-07-17 NOTE — Transfer of Care (Signed)
Immediate Anesthesia Transfer of Care Note  Patient: Brandon RheinRobert S Blackley  Procedure(s) Performed: APPENDECTOMY LAPAROSCOPIC (N/A )  Patient Location: PACU  Anesthesia Type:General  Level of Consciousness: awake, alert  and oriented  Airway & Oxygen Therapy: Patient Spontanous Breathing and Patient connected to nasal cannula oxygen  Post-op Assessment: Report given to RN and Post -op Vital signs reviewed and stable  Post vital signs: Reviewed and stable  Last Vitals:  Vitals Value Taken Time  BP 128/84 07/17/2018 11:38 AM  Temp    Pulse 88 07/17/2018 11:39 AM  Resp 17 07/17/2018 11:39 AM  SpO2 100 % 07/17/2018 11:39 AM  Vitals shown include unvalidated device data.  Last Pain:  Vitals:   07/17/18 0502  TempSrc:   PainSc: 5          Complications: No apparent anesthesia complications

## 2018-07-17 NOTE — Consult Note (Signed)
MEDICATION RELATED CONSULT NOTE - INITIAL   Pharmacy Consult for Clozapine lab monitoring and REMS reporting Indication: Schizoaffective disorder  Allergies  Allergen Reactions  . Compazine [Prochlorperazine] Other (See Comments)    Dystonic rxn - convulsions. Near fatal reaction.   Ardine Bjork. Ivp Dye [Iodinated Diagnostic Agents] Shortness Of Breath    Pt states SOB after last IV contrast injection  . Alcohol-Sulfur [Sulfur] Other (See Comments)    History of alcoholism  . Benadryl [Diphenhydramine] Other (See Comments)    "stuffy", nasal congestion  . Depakote [Divalproex Sodium] Other (See Comments)    Cause elevated ammonia  . Dramamine [Dimenhydrinate] Swelling  . Plavix [Clopidogrel] Other (See Comments)    Rectal bleeding. "Perforated my intestines."     Labs: Recent Labs    07/17/18 0457  WBC 7.9  HGB 10.6*  HCT 33.9*  PLT 370  CREATININE 1.78*  ALBUMIN 3.9  PROT 6.4*  AST 21  ALT 14  ALKPHOS 50  BILITOT 0.7   Medical History: Past Medical History:  Diagnosis Date  . Anemia    IDA  . Colon polyps   . COPD (chronic obstructive pulmonary disease) (HCC)   . Coronary artery disease   . Heart attack (HCC) 2005  . Hiatal hernia   . History of ETOH abuse   . Hyperlipidemia   . Hypertension   . Pleurisy   . Schizophrenia (HCC)   . Schizophrenia (HCC)   . Seizures (HCC)    grand mal in 2000 or 2001 recovery from alcoholism  . Suicide attempt [facility](HCC)       Assessment: Pharmacy has been consulted for clozapine monitoring and REMS program reporting   Plan:  07/17/2018 - ANC 6900 - Lab has been reported to clozapine REMS program - pt is eligible to receive Clozapine with every 2 week lab monitoring.  Per protocol, will monitor ANC weekly - next CBC with diff 07/24/18  Albina Billetharles M Shuntia Exton, PharmD, BCPS Clinical Pharmacist 07/17/2018 2:15 PM

## 2018-07-17 NOTE — ED Notes (Signed)
Pt c/o of abdominal pain for the last 24 hours, pt reports hx of gall bladder removal, reports feeling of fulness and reports last BM approx 12 hours ago  Pt appears pale, POC shared with pt, already assessed by EDP

## 2018-07-17 NOTE — Progress Notes (Signed)
15 minute call to floor. 

## 2018-07-17 NOTE — Anesthesia Post-op Follow-up Note (Signed)
Anesthesia QCDR form completed.        

## 2018-07-17 NOTE — Anesthesia Preprocedure Evaluation (Addendum)
Anesthesia Evaluation  Patient identified by MRN, date of birth, ID band Patient awake    Reviewed: Allergy & Precautions, H&P , NPO status , Patient's Chart, lab work & pertinent test results  History of Anesthesia Complications Negative for: history of anesthetic complications  Airway Mallampati: III  TM Distance: <3 FB Neck ROM: limited    Dental  (+) Chipped, Poor Dentition, Missing   Pulmonary COPD, former smoker,           Cardiovascular Exercise Tolerance: Good hypertension, (-) angina+ CAD, + Past MI and + Cardiac Stents  (-) DOE      Neuro/Psych Seizures -, Well Controlled,  PSYCHIATRIC DISORDERS    GI/Hepatic Neg liver ROS, hiatal hernia, GERD  Controlled,  Endo/Other  negative endocrine ROS  Renal/GU CRFRenal disease     Musculoskeletal   Abdominal   Peds  Hematology negative hematology ROS (+)   Anesthesia Other Findings Patient is NPO appropriate and reports no nausea or vomiting today.   Past Medical History: No date: Anemia     Comment:  IDA No date: Colon polyps No date: COPD (chronic obstructive pulmonary disease) (HCC) No date: Coronary artery disease 2005: Heart attack (HCC) No date: Hiatal hernia No date: History of ETOH abuse No date: Hyperlipidemia No date: Hypertension No date: Pleurisy No date: Schizophrenia (HCC) No date: Schizophrenia (HCC) No date: Seizures (HCC)     Comment:  grand mal in 2000 or 2001 recovery from alcoholism No date: Suicide attempt [facility])  Past Surgical History: No date: ABDOMINAL SURGERY     Comment:  internal bleeding 05/04/2018: CHOLECYSTECTOMY; N/A     Comment:  Procedure: LAPAROSCOPIC CHOLECYSTECTOMY, converted to               open;  Surgeon: Sung Amabile, DO;  Location: [facility] ORS;                Service: General;  Laterality: N/A; No date: COLONOSCOPY 05/09/2016: COLONOSCOPY WITH PROPOFOL; N/A     Comment:  Procedure: COLONOSCOPY WITH PROPOFOL;   Surgeon: Scot Jun, MD;  Location: [facility] ENDOSCOPY;  Service:               Endoscopy;  Laterality: N/A; No date: CORONARY ANGIOPLASTY WITH STENT PLACEMENT     Comment:  1 vessel 05/09/2016: ESOPHAGOGASTRODUODENOSCOPY (EGD) WITH PROPOFOL; N/A     Comment:  Procedure: ESOPHAGOGASTRODUODENOSCOPY (EGD) WITH               PROPOFOL;  Surgeon: Scot Jun, MD;  Location: [facility]              ENDOSCOPY;  Service: Endoscopy;  Laterality: N/A; No date: EYE SURGERY; Right No date: GLAUCOMA SURGERY 10/19/2017: LEFT HEART CATH; Right     Comment:  Procedure: Left Heart Cath and Coronary Angiography;                Surgeon: Laurier Nancy, MD;  Location: [facility] INVASIVE CV              LAB;  Service: Cardiovascular;  Laterality: Right; No date: NOSE SURGERY No date: TOE AMPUTATION; Left     Comment:  2nd toe No date: VASECTOMY  BMI    Body Mass Index:  27.12 kg/m      Reproductive/Obstetrics negative OB ROS  Anesthesia Physical Anesthesia Plan  ASA: III  Anesthesia Plan: General ETT   Post-op Pain Management:    Induction: Intravenous  PONV Risk Score and Plan: Ondansetron, Dexamethasone, Midazolam and Treatment may vary due to age or medical condition  Airway Management Planned: Oral ETT  Additional Equipment:   Intra-op Plan:   Post-operative Plan: Extubation in OR  Informed Consent: I have reviewed the patients History and Physical, chart, labs and discussed the procedure including the risks, benefits and alternatives for the proposed anesthesia with the patient or authorized representative who has indicated his/her understanding and acceptance.   Dental Advisory Given  Plan Discussed with: Anesthesiologist, CRNA and Surgeon  Anesthesia Plan Comments: (Patient consented for risks of anesthesia including but not limited to:  - adverse reactions to medications - damage to teeth, lips or other oral  mucosa - sore throat or hoarseness - Damage to heart, brain, lungs or loss of life  Patient voiced understanding.)        Anesthesia Quick Evaluation

## 2018-07-18 ENCOUNTER — Encounter: Payer: Self-pay | Admitting: Surgery

## 2018-07-18 LAB — BASIC METABOLIC PANEL
Anion gap: 9 (ref 5–15)
BUN: 13 mg/dL (ref 8–23)
CHLORIDE: 102 mmol/L (ref 98–111)
CO2: 21 mmol/L — ABNORMAL LOW (ref 22–32)
Calcium: 8.6 mg/dL — ABNORMAL LOW (ref 8.9–10.3)
Creatinine, Ser: 1.11 mg/dL (ref 0.61–1.24)
GFR calc Af Amer: >60 mL/min (ref >60)
GFR calc non Af Amer: >60 mL/min (ref >60)
Glucose, Bld: 123 mg/dL — ABNORMAL HIGH (ref 70–99)
POTASSIUM: 4.1 mmol/L (ref 3.5–5.1)
Sodium: 132 mmol/L — ABNORMAL LOW (ref 135–145)

## 2018-07-18 LAB — CBC
HCT: 27.5 % — ABNORMAL LOW (ref 39.0–52.0)
HEMOGLOBIN: 8.7 g/dL — AB (ref 13.0–17.0)
MCH: 26.4 pg (ref 26.0–34.0)
MCHC: 31.6 g/dL (ref 30.0–36.0)
MCV: 83.6 fL (ref 80.0–100.0)
NRBC: 0 % (ref 0.0–0.2)
Platelets: 299 10*3/uL (ref 150–400)
RBC: 3.29 MIL/uL — AB (ref 4.22–5.81)
RDW: 15 % (ref 11.5–15.5)
WBC: 10.8 10*3/uL — ABNORMAL HIGH (ref 4.0–10.5)

## 2018-07-18 LAB — CBC WITH DIFFERENTIAL/PLATELET
Abs Immature Granulocytes: 0.05 10*3/uL (ref 0.00–0.07)
Basophils Absolute: 0 10*3/uL (ref 0.0–0.1)
Basophils Relative: 0 %
EOS ABS: 0 10*3/uL (ref 0.0–0.5)
Eosinophils Relative: 0 %
HCT: 28.2 % — ABNORMAL LOW (ref 39.0–52.0)
Hemoglobin: 8.8 g/dL — ABNORMAL LOW (ref 13.0–17.0)
Immature Granulocytes: 0 %
Lymphocytes Relative: 7 %
Lymphs Abs: 0.8 10*3/uL (ref 0.7–4.0)
MCH: 26.3 pg (ref 26.0–34.0)
MCHC: 31.2 g/dL (ref 30.0–36.0)
MCV: 84.2 fL (ref 80.0–100.0)
Monocytes Absolute: 1.1 10*3/uL — ABNORMAL HIGH (ref 0.1–1.0)
Monocytes Relative: 9 %
Neutro Abs: 9.5 10*3/uL — ABNORMAL HIGH (ref 1.7–7.7)
Neutrophils Relative %: 84 %
Platelets: 302 10*3/uL (ref 150–400)
RBC: 3.35 MIL/uL — ABNORMAL LOW (ref 4.22–5.81)
RDW: 15.1 % (ref 11.5–15.5)
WBC: 11.5 10*3/uL — AB (ref 4.0–10.5)
nRBC: 0 % (ref 0.0–0.2)

## 2018-07-18 LAB — PHOSPHORUS: Phosphorus: 3.9 mg/dL (ref 2.5–4.6)

## 2018-07-18 LAB — GLUCOSE, CAPILLARY: Glucose-Capillary: 114 mg/dL — ABNORMAL HIGH (ref 70–99)

## 2018-07-18 LAB — MAGNESIUM: Magnesium: 2.1 mg/dL (ref 1.7–2.4)

## 2018-07-18 MED ORDER — FLEET ENEMA 7-19 GM/118ML RE ENEM
1.0000 | ENEMA | Freq: Once | RECTAL | Status: AC
  Administered 2018-07-18: 1 via RECTAL

## 2018-07-18 NOTE — Progress Notes (Signed)
Dr. Tonna BoehringerSakai paged. 222/ Brandon [name]- FYI, the patient has in occassions some sharp pain that develops in the left side of his abdomen despite receiving pain medications.

## 2018-07-18 NOTE — Progress Notes (Signed)
Subjective:  CC:  Brandon RheinRobert S [name] is a 62 y.o. male  Hospital stay day 0, 1 Day Post-Op lap appy, perforated  HPI: No issues overnight.  Still sore in area  ROS:  General: Denies weight loss, weight gain, fatigue, fevers, chills, and night sweats. Heart: Denies chest pain, palpitations, racing heart, irregular heartbeat, leg pain or swelling, and decreased activity tolerance. Respiratory: Denies breathing difficulty, shortness of breath, wheezing, cough, and sputum. GI: Denies change in appetite, heartburn, nausea, vomiting, constipation, diarrhea, and blood in stool. GU: Denies difficulty urinating, pain with urinating, urgency, frequency, blood in urine, and heavy menstrual bleeding.  pertinent positives and negatives noted in HPI.   Objective:      Temp:  [97.6 F (36.4 C)-98.3 F (36.8 C)] 97.8 F (36.6 C) (12/08 0857) Pulse Rate:  [74-89] 79 (12/08 0857) Resp:  [17-20] 17 (12/08 0857) BP: (109-140)/(68-94) 132/82 (12/08 0857) SpO2:  [95 %-100 %] 99 % (12/08 0857)     Height: 6' (182.9 cm) Weight: 90.7 kg BMI (Calculated): 27.12   Intake/Output this shift:   Intake/Output Summary (Last 24 hours) at 07/18/2018 1009 Last data filed at 07/18/2018 [id number] Gross per 24 hour  Intake 3031.22 ml  Output 1555 ml  Net 1476.22 ml        Constitutional :  alert, cooperative, appears stated age and no distress  Gastrointestinal: soft, non-tender; bowel sounds normal; no masses,  no organomegaly.   Skin: Cool and moist. Port sites c/d/i  Psychiatric: Normal affect, non-agitated, not confused       LABS:  CMP Latest Ref Rng & Units 07/18/2018 07/17/2018 06/15/2018  Glucose 70 - 99 mg/dL 960(A123(H) 540(J102(H) 811(B108(H)  BUN 8 - 23 mg/dL 13 12 6(L)  Creatinine 0.61 - 1.24 mg/dL 1.471.11 8.29(F1.78(H) 6.21(H1.27(H)  Sodium 135 - 145 mmol/L 132(L) 127(L) 135  Potassium 3.5 - 5.1 mmol/L 4.1 3.7 3.8  Chloride 98 - 111 mmol/L 102 94(L) 104  CO2 22 - 32 mmol/L 21(L) 25 24  Calcium 8.9 - 10.3 mg/dL 0.8(M8.6(L) 9.1  5.7(Q8.8(L)  Total Protein 6.5 - 8.1 g/dL - 6.4(L) 5.9(L)  Total Bilirubin 0.3 - 1.2 mg/dL - 0.7 0.4  Alkaline Phos 38 - 126 U/L - 50 51  AST 15 - 41 U/L - 21 18  ALT 0 - 44 U/L - 14 12   CBC Latest Ref Rng & Units 07/18/2018 07/17/2018 06/15/2018  WBC 4.0 - 10.5 K/uL 10.8(H) 7.9 7.5  Hemoglobin 13.0 - 17.0 g/dL 4.6(N8.7(L) 10.6(L) 9.8(L)  Hematocrit 39.0 - 52.0 % 27.5(L) 33.9(L) 30.7(L)  Platelets 150 - 400 K/uL 299 370 527(H)    RADS: N/a Assessment:   A/p lap appy, perforated  Advance to clears today, recheck cbc in pm to ensure hgb is stable(admission labs likely falsely updated due to dehydration), increased creatinine resolved.  Will advance diet further if labs ok and patient continues to feel well.  Continue IV abx due to perforation, will send home with oral abx

## 2018-07-18 NOTE — Care Management Obs Status (Signed)
MEDICARE OBSERVATION STATUS NOTIFICATION   Patient Details  Name: Brandon RheinRobert S Cullinan MRN: [id number] Date of Birth: [birth year]   Medicare Observation Status Notification Given:  Yes    Alichia Alridge A Ezmeralda Stefanick, RN 07/18/2018, 10:36 AM

## 2018-07-18 NOTE — Plan of Care (Signed)
No skin breakdown other than surgical incisions in abdomen. No falls. Bed low and alarm on. Fleet enema given per order of doctor [place]. The patient has been complaining of some sharp pain in the L side of the abdomen which are sudden yet it seemed that it has been better managed with PRN pain medication. The patient  reported to have a small bowel movement after the enema. The patient has been stable at this point.    Problem: Education: Goal: Knowledge of General Education information will improve Description Including pain rating scale, medication(s)/side effects and non-pharmacologic comfort measures Outcome: Progressing   Problem: Health Behavior/Discharge Planning: Goal: Ability to manage health-related needs will improve Outcome: Progressing   Problem: Clinical Measurements: Goal: Ability to maintain clinical measurements within normal limits will improve Outcome: Progressing Goal: Will remain free from infection Outcome: Progressing Goal: Diagnostic test results will improve Outcome: Progressing Goal: Respiratory complications will improve Outcome: Progressing Goal: Cardiovascular complication will be avoided Outcome: Progressing   Problem: Activity: Goal: Risk for activity intolerance will decrease Outcome: Progressing   Problem: Nutrition: Goal: Adequate nutrition will be maintained Outcome: Progressing   Problem: Coping: Goal: Level of anxiety will decrease Outcome: Progressing   Problem: Elimination: Goal: Will not experience complications related to bowel motility Outcome: Progressing Goal: Will not experience complications related to urinary retention Outcome: Progressing   Problem: Pain Managment: Goal: General experience of comfort will improve Outcome: Progressing   Problem: Safety: Goal: Ability to remain free from injury will improve Outcome: Progressing   Problem: Skin Integrity: Goal: Risk for impaired skin integrity will decrease Outcome:  Progressing

## 2018-07-18 NOTE — Op Note (Signed)
Preoperative diagnosis: Acute appendicitis.  Postoperative diagnosis: Acute appendicitis, perforated  Procedure: Laparoscopic appendectomy.  Anesthesia: GETA  Surgeon: Sung AmabileIsami Shauntee Karp  Wound Classification: clean contaminated  Specimen: Appendix  Complications: None  Estimated Blood Loss: 3 mL   Indications: Patient is a 62 y.o. male  presented with right lower quadrant pain.  Computed tomography scan and physical examination were consistent with acute appendicitis.   Findings: 1. Acutely inflamed appendix 2. peri-appendiceal  phlegmon 3. Normal anatomy 4. Appendiceal artery ligated and divided with EndoGIA 5. Adequate hemostasis.   Description of procedure: The patient was placed on the operating table in the supine position, left arm tucked. General anesthesia was induced. A time-out was completed verifying correct patient, procedure, site, positioning, and implant(s) and/or special equipment prior to beginning this procedure.  The abdomen was prepped and draped in the usual sterile fashion.   After local was infused, an incision was made inferior to the umbilicus through previous incision.  The fascia was elevated with Coker's and Veress needle placed through fascia, saline drop test performed,  and abdomen insufflated with carbon dioxide to a pressure of 15 mmHg. The patient tolerated insufflation well.  Needle removed and 12mm port placed. The laparoscope was inserted and the abdomen inspected. No injuries from initial trocar placement were noted.  Inspection noted cecum to be in RUQ due to omentum and mesentary densely adhered to the former cholecystectomy site.  The fossa could not be visualized without extensive dissection so decision made to forego further exploration of that area.   One 5mm port and another 5-mm port was then placed in a triangular fashion with camera port with the RLQ.  Care was taken to avoid injury to the inferior epigastric vessels. The cecum was identified  and immediate pus was noted after mobilization to attempt visualization of appendix.  An inflamed appendix was eventually identified and elevated.  No other pathology noted and inflammation was all surrounding the appendix.  Window created at base of appendix in the mesentery.   An endoscopic blue load linear cutting stapler was then used to divide and staple the base of the appendix. It was reloaded with a vascular cartridge and the mesoappendix similarly divided.  The appendix was placed in an endoscopic retrieval bag and removed.   The appendiceal stump was examined and hemostasis noted.  Cecum was inflammed but staple line looked healthy.  Scant serosanguinous fluid and minimal blood suctioned out.  No other pathology was identified within pelvis.  trocars were removed under direct vision. No bleeding was noted. The laparoscope was withdrawn and the umbilical trocar removed. The abdomen was allowed to collapse. 12 mm trocar site closed with Vicryl 0 in figure eight fashion.  Deep dermal then closed with 3-0 vicryl interrupted fashion.  All skin incisions then closed with subcuticular sutures Monocryl 4-0.  Wounds then dressed with dermabond.  The patient tolerated the procedure well, foley removed, awakened from anesthesia and was taken to the postanesthesia care unit in satisfactory condition.  Sponge count and instrument count correct at the end of the procedure.

## 2018-07-19 DIAGNOSIS — D649 Anemia, unspecified: Secondary | ICD-10-CM | POA: Diagnosis present

## 2018-07-19 DIAGNOSIS — F1020 Alcohol dependence, uncomplicated: Secondary | ICD-10-CM | POA: Diagnosis present

## 2018-07-19 DIAGNOSIS — K5730 Diverticulosis of large intestine without perforation or abscess without bleeding: Secondary | ICD-10-CM | POA: Diagnosis present

## 2018-07-19 DIAGNOSIS — Z8719 Personal history of other diseases of the digestive system: Secondary | ICD-10-CM | POA: Diagnosis not present

## 2018-07-19 DIAGNOSIS — F259 Schizoaffective disorder, unspecified: Secondary | ICD-10-CM | POA: Diagnosis present

## 2018-07-19 DIAGNOSIS — Z888 Allergy status to other drugs, medicaments and biological substances status: Secondary | ICD-10-CM | POA: Diagnosis not present

## 2018-07-19 DIAGNOSIS — E860 Dehydration: Secondary | ICD-10-CM | POA: Diagnosis present

## 2018-07-19 DIAGNOSIS — Z801 Family history of malignant neoplasm of trachea, bronchus and lung: Secondary | ICD-10-CM | POA: Diagnosis not present

## 2018-07-19 DIAGNOSIS — Z7902 Long term (current) use of antithrombotics/antiplatelets: Secondary | ICD-10-CM | POA: Diagnosis not present

## 2018-07-19 DIAGNOSIS — Z91041 Radiographic dye allergy status: Secondary | ICD-10-CM | POA: Diagnosis not present

## 2018-07-19 DIAGNOSIS — R569 Unspecified convulsions: Secondary | ICD-10-CM | POA: Diagnosis present

## 2018-07-19 DIAGNOSIS — K5900 Constipation, unspecified: Secondary | ICD-10-CM | POA: Diagnosis present

## 2018-07-19 DIAGNOSIS — N179 Acute kidney failure, unspecified: Secondary | ICD-10-CM | POA: Diagnosis present

## 2018-07-19 DIAGNOSIS — Z955 Presence of coronary angioplasty implant and graft: Secondary | ICD-10-CM | POA: Diagnosis not present

## 2018-07-19 DIAGNOSIS — I10 Essential (primary) hypertension: Secondary | ICD-10-CM | POA: Diagnosis present

## 2018-07-19 DIAGNOSIS — R091 Pleurisy: Secondary | ICD-10-CM | POA: Diagnosis present

## 2018-07-19 DIAGNOSIS — K449 Diaphragmatic hernia without obstruction or gangrene: Secondary | ICD-10-CM | POA: Diagnosis present

## 2018-07-19 DIAGNOSIS — J449 Chronic obstructive pulmonary disease, unspecified: Secondary | ICD-10-CM | POA: Diagnosis present

## 2018-07-19 DIAGNOSIS — Z87891 Personal history of nicotine dependence: Secondary | ICD-10-CM | POA: Diagnosis not present

## 2018-07-19 DIAGNOSIS — E785 Hyperlipidemia, unspecified: Secondary | ICD-10-CM | POA: Diagnosis present

## 2018-07-19 DIAGNOSIS — Z8249 Family history of ischemic heart disease and other diseases of the circulatory system: Secondary | ICD-10-CM | POA: Diagnosis not present

## 2018-07-19 DIAGNOSIS — I2510 Atherosclerotic heart disease of native coronary artery without angina pectoris: Secondary | ICD-10-CM | POA: Diagnosis present

## 2018-07-19 DIAGNOSIS — I252 Old myocardial infarction: Secondary | ICD-10-CM | POA: Diagnosis not present

## 2018-07-19 DIAGNOSIS — K3532 Acute appendicitis with perforation and localized peritonitis, without abscess: Secondary | ICD-10-CM | POA: Diagnosis present

## 2018-07-19 LAB — BASIC METABOLIC PANEL
Anion gap: 6 (ref 5–15)
BUN: 16 mg/dL (ref 8–23)
CO2: 27 mmol/L (ref 22–32)
Calcium: 8.7 mg/dL — ABNORMAL LOW (ref 8.9–10.3)
Chloride: 102 mmol/L (ref 98–111)
Creatinine, Ser: 1.31 mg/dL — ABNORMAL HIGH (ref 0.61–1.24)
GFR calc non Af Amer: 58 mL/min — ABNORMAL LOW (ref >60)
Glucose, Bld: 100 mg/dL — ABNORMAL HIGH (ref 70–99)
Potassium: 3.6 mmol/L (ref 3.5–5.1)
Sodium: 135 mmol/L (ref 135–145)

## 2018-07-19 LAB — CBC
HEMATOCRIT: 27.9 % — AB (ref 39.0–52.0)
Hemoglobin: 8.9 g/dL — ABNORMAL LOW (ref 13.0–17.0)
MCH: 26.2 pg (ref 26.0–34.0)
MCHC: 31.9 g/dL (ref 30.0–36.0)
MCV: 82.1 fL (ref 80.0–100.0)
Platelets: 342 10*3/uL (ref 150–400)
RBC: 3.4 MIL/uL — ABNORMAL LOW (ref 4.22–5.81)
RDW: 15.2 % (ref 11.5–15.5)
WBC: 9 10*3/uL (ref 4.0–10.5)
nRBC: 0 % (ref 0.0–0.2)

## 2018-07-19 LAB — PHOSPHORUS: Phosphorus: 2.7 mg/dL (ref 2.5–4.6)

## 2018-07-19 LAB — MAGNESIUM: Magnesium: 2 mg/dL (ref 1.7–2.4)

## 2018-07-19 MED ORDER — HEPARIN SODIUM (PORCINE) 5000 UNIT/ML IJ SOLN
5000.0000 [IU] | Freq: Two times a day (BID) | INTRAMUSCULAR | Status: DC
  Administered 2018-07-20: 5000 [IU] via SUBCUTANEOUS
  Filled 2018-07-19: qty 1

## 2018-07-19 MED ORDER — AMOXICILLIN-POT CLAVULANATE 875-125 MG PO TABS
1.0000 | ORAL_TABLET | Freq: Two times a day (BID) | ORAL | Status: DC
  Administered 2018-07-19 – 2018-07-20 (×3): 1 via ORAL
  Filled 2018-07-19 (×3): qty 1

## 2018-07-19 NOTE — Evaluation (Signed)
Occupational Therapy Evaluation Patient Details Name: Brandon [name] MRN: [id number] DOB: 03-16-1956 Today's Date: 07/19/2018    History of Present Illness Pt admitted to hospital after syncopal episode resulting in an appendicitis with perforation. PMH includes anemia, colon polyps, COPD, CAD, MI (2005), ETOH abuse, HTN and schizophrenia    Clinical Impression   Pt seen for OT evaluation this date. Prior to hospital admission, pt was independent with ADL/IADL tasks and occasionally used a "staff" for mobility outside the home. Pt presented in bed and was eager for OT session. Currently, pt demonstrates impairments in (see OT Problem List below) requiring Supervision for ADL and mobillity.  No further OT services are required during this hospitalization. Upon hospital discharge, no OT follow up required.     Follow Up Recommendations  No OT follow up    Equipment Recommendations  None recommended by OT    Recommendations for Other Services       Precautions / Restrictions Precautions Precautions: None Restrictions Weight Bearing Restrictions: No Other Position/Activity Restrictions: OOB as tolerated      Mobility Bed Mobility Overal bed mobility: Independent                Transfers Overall transfer level: Modified independent               General transfer comment: additional time/effort but no physical assist.     Balance Overall balance assessment: Modified Independent(Per pt, "normal standing posture is knees bent and L foot abducted. L toe amputated during previous hospital visit. )                                         ADL either performed or assessed with clinical judgement   ADL Overall ADL's : Needs assistance/impaired Eating/Feeding: Independent;Sitting   Grooming: Independent;Sitting   Upper Body Bathing: Supervision/ safety;Sitting   Lower Body Bathing: Min guard;Sitting/lateral leans   Upper Body Dressing : Modified  independent;Sitting   Lower Body Dressing: Sit to/from stand;Min guard   Toilet Transfer: Supervision/safety;Ambulation;Regular Toilet           Functional mobility during ADLs: Supervision/safety(no physical assist needed, chair behind 2/2 pt feeling "a bit lightheaded")       Vision Baseline Vision/History: Wears glasses Wears Glasses: Reading only Patient Visual Report: No change from baseline       Perception     Praxis      Pertinent Vitals/Pain Pain Assessment: 0-10 Pain Score: 4  Pain Location: L side of trunk Pain Descriptors / Indicators: Constant;Sharp;Aching Pain Intervention(s): Monitored during session;Repositioned;Limited activity within patient's tolerance     Hand Dominance     Extremity/Trunk Assessment Upper Extremity Assessment Upper Extremity Assessment: Overall WFL for tasks assessed(grossly 4+/5 strength)   Lower Extremity Assessment Lower Extremity Assessment: Overall WFL for tasks assessed       Communication Communication Communication: No difficulties   Cognition Arousal/Alertness: Awake/alert Behavior During Therapy: WFL for tasks assessed/performed Overall Cognitive Status: Within Functional Limits for tasks assessed                                     General Comments      Exercises     Shoulder Instructions      Home Living Family/patient expects to be discharged to:: Private residence Living Arrangements: Alone Available Help  at Discharge: Available PRN/intermittently;Friend(s);Neighbor Type of Home: Apartment Home Access: Stairs to enter Entergy CorporationEntrance Stairs-Number of Steps: 3+1 Entrance Stairs-Rails: Left Home Layout: One level     Bathroom Shower/Tub: Chief Strategy OfficerTub/shower unit   Bathroom Toilet: Handicapped height     Home Equipment: None          Prior Functioning/Environment Level of Independence: Independent        Comments: Pt independent with all ADL/IADL tasks and uses "staff" outy in the  community PRN.        OT Problem List: Decreased strength;Impaired balance (sitting and/or standing);Pain      OT Treatment/Interventions:      OT Goals(Current goals can be found in the care plan section) Acute Rehab OT Goals OT Goal Formulation: All assessment and education complete, DC therapy  OT Frequency:     Barriers to D/C:            Co-evaluation              AM-PAC OT "6 Clicks" Daily Activity     Outcome Measure Help from another person eating meals?: None Help from another person taking care of personal grooming?: None Help from another person toileting, which includes using toliet, bedpan, or urinal?: None Help from another person bathing (including washing, rinsing, drying)?: None Help from another person to put on and taking off regular upper body clothing?: None Help from another person to put on and taking off regular lower body clothing?: None 6 Click Score: 24   End of Session Equipment Utilized During Treatment: Gait belt  Activity Tolerance: Patient tolerated treatment well Patient left: in bed;with call bell/phone within reach;with bed alarm set  OT Visit Diagnosis: Other abnormalities of gait and mobility (R26.89);Pain                Time: [id number] OT Time Calculation (min): 24 min Charges:     Brandon BalsamMarisa Nejla [name] OTS  07/19/2018, 11:14 AM

## 2018-07-19 NOTE — Progress Notes (Signed)
Subjective:  CC:  Brandon RheinRobert S [name] is a 62 y.o. male  Hospital stay day 2, 2 Days Post-Op lap appy, perforated  HPI: No issues overnight.  Still sore in area, but tolerated clears.  Only small BM, but LLQ has improved after enemas.  ROS:  General: Denies weight loss, weight gain, fatigue, fevers, chills, and night sweats. Heart: Denies chest pain, palpitations, racing heart, irregular heartbeat, leg pain or swelling, and decreased activity tolerance. Respiratory: Denies breathing difficulty, shortness of breath, wheezing, cough, and sputum. GI: Denies change in appetite, heartburn, nausea, vomiting, constipation, diarrhea, and blood in stool. GU: Denies difficulty urinating, pain with urinating, urgency, frequency, blood in urine, and heavy menstrual bleeding.  pertinent positives and negatives noted in HPI.   Objective:      Temp:  [97.4 F (36.3 C)-98.5 F (36.9 C)] 98.5 F (36.9 C) (12/09 0504) Pulse Rate:  [66-79] 79 (12/09 0504) Resp:  [18-20] 20 (12/09 0504) BP: (143-153)/(87-95) 144/87 (12/09 0504) SpO2:  [97 %-100 %] 97 % (12/09 0504)     Height: 6' (182.9 cm) Weight: 90.7 kg BMI (Calculated): 27.12   Intake/Output this shift:   Intake/Output Summary (Last 24 hours) at 07/19/2018 0928 Last data filed at 07/19/2018 [id number] Gross per 24 hour  Intake 1120.03 ml  Output 4400 ml  Net -3279.97 ml        Constitutional :  alert, cooperative, appears stated age and no distress  Gastrointestinal: soft, non-tender; bowel sounds normal; no masses,  no organomegaly.   Skin: Cool and moist. Port sites c/d/i  Psychiatric: Normal affect, non-agitated, not confused       LABS:  CMP Latest Ref Rng & Units 07/19/2018 07/18/2018 07/17/2018  Glucose 70 - 99 mg/dL 960(A100(H) 540(J123(H) 811(B102(H)  BUN 8 - 23 mg/dL 16 13 12   Creatinine 0.61 - 1.24 mg/dL 1.47(W1.31(H) 2.951.11 6.21(H1.78(H)  Sodium 135 - 145 mmol/L 135 132(L) 127(L)  Potassium 3.5 - 5.1 mmol/L 3.6 4.1 3.7  Chloride 98 - 111 mmol/L 102 102 94(L)   CO2 22 - 32 mmol/L 27 21(L) 25  Calcium 8.9 - 10.3 mg/dL 0.8(M8.7(L) 5.7(Q8.6(L) 9.1  Total Protein 6.5 - 8.1 g/dL - - 6.4(L)  Total Bilirubin 0.3 - 1.2 mg/dL - - 0.7  Alkaline Phos 38 - 126 U/L - - 50  AST 15 - 41 U/L - - 21  ALT 0 - 44 U/L - - 14   CBC Latest Ref Rng & Units 07/19/2018 07/18/2018 07/18/2018  WBC 4.0 - 10.5 K/uL 9.0 11.5(H) 10.8(H)  Hemoglobin 13.0 - 17.0 g/dL 4.6(N8.9(L) 6.2(X8.8(L) 5.2(W8.7(L)  Hematocrit 39.0 - 52.0 % 27.9(L) 28.2(L) 27.5(L)  Platelets 150 - 400 K/uL 342 302 299    RADS: N/a Assessment:   A/p lap appy, perforated  Advance to regular diet today, recheck BMP in am due to bump in Cr level.  Stopped nephrotoxic drugs, and will switch to oral abx.  Due to complicated history in past with difficult recovery from surgery, will keep monitoring inhouse until Cr level is normal.

## 2018-07-19 NOTE — Progress Notes (Signed)
PT Cancellation Note  Patient Details Name: Brandon RheinRobert S [name] MRN: [id number] DOB: 28-Aug-1955   Cancelled Treatment:    Reason Eval/Treat Not Completed: (Consult received and chart reviewed.  Per RN and discussion with patient, patient has walked 8 total laps around nursing station with RW this AM.  No acute PT needs identifed at this time; patient in agreement.  Would like RW to have at discharge; RNCM informed/aware.  Will complete order at this time.  Please re-consult as medically appropriate should needs change.)   Bryn Saline H. Manson PasseyBrown, PT, DPT, NCS 07/19/18, 12:36 PM [phone]

## 2018-07-20 LAB — MAGNESIUM: Magnesium: 1.8 mg/dL (ref 1.7–2.4)

## 2018-07-20 LAB — PHOSPHORUS: Phosphorus: 3.3 mg/dL (ref 2.5–4.6)

## 2018-07-20 LAB — SURGICAL PATHOLOGY

## 2018-07-20 MED ORDER — ACETAMINOPHEN 325 MG PO TABS: 650.0000 mg | ORAL_TABLET | Freq: Three times a day (TID) | ORAL | 0 refills | Status: AC | PRN

## 2018-07-20 MED ORDER — POLYETHYLENE GLYCOL 3350 17 G PO PACK
17.0000 g | PACK | Freq: Every day | ORAL | Status: DC
  Administered 2018-07-20: 17 g via ORAL
  Filled 2018-07-20: qty 1

## 2018-07-20 MED ORDER — AMOXICILLIN-POT CLAVULANATE 875-125 MG PO TABS: 1.0000 | ORAL_TABLET | Freq: Two times a day (BID) | ORAL | 0 refills | Status: AC

## 2018-07-20 NOTE — Progress Notes (Signed)
Pt discharged per MD order. IV removed. Discharge instructions reviewed with pt. Pt verbalized understanding for instructions. Pt taken to car in wheelchair by staff.

## 2018-07-20 NOTE — Progress Notes (Signed)
Subjective:  CC:  Brandon [name] is a 62 y.o. male  Hospital stay day 2, 3 Days Post-Op lap appy, perforated  HPI: No issues overnight.  Tolerated regular diet.    ROS:  General: Denies weight loss, weight gain, fatigue, fevers, chills, and night sweats. Heart: Denies chest pain, palpitations, racing heart, irregular heartbeat, leg pain or swelling, and decreased activity tolerance. Respiratory: Denies breathing difficulty, shortness of breath, wheezing, cough, and sputum. GI: Denies change in appetite, heartburn, nausea, vomiting, constipation, diarrhea, and blood in stool. GU: Denies difficulty urinating, pain with urinating, urgency, frequency, blood in urine, and heavy menstrual bleeding.  pertinent positives and negatives noted in HPI.   Objective:      Temp:  [97.7 F (36.5 C)-98.3 F (36.8 C)] 98.3 F (36.8 C) (12/10 0519) Pulse Rate:  [81-84] 81 (12/10 0519) Resp:  [16-20] 20 (12/10 0519) BP: (113-137)/(76-87) 125/85 (12/10 0519) SpO2:  [97 %-100 %] 97 % (12/10 0519)     Height: 6' (182.9 cm) Weight: 90.7 kg BMI (Calculated): 27.12   Intake/Output this shift:   Intake/Output Summary (Last 24 hours) at 07/20/2018 0747 Last data filed at 07/20/2018 0006 Gross per 24 hour  Intake 600 ml  Output 2275 ml  Net -1675 ml        Constitutional :  alert, cooperative, appears stated age and no distress  Gastrointestinal: soft, non-tender; bowel sounds normal; no masses,  no organomegaly.   Skin: Cool and moist. Port sites c/d/i  Psychiatric: Normal affect, non-agitated, not confused       LABS:  CMP Latest Ref Rng & Units 07/19/2018 07/18/2018 07/17/2018  Glucose 70 - 99 mg/dL 161(W100(H) 960(A123(H) 540(J102(H)  BUN 8 - 23 mg/dL 16 13 12   Creatinine 0.61 - 1.24 mg/dL 8.11(B1.31(H) 1.471.11 8.29(F1.78(H)  Sodium 135 - 145 mmol/L 135 132(L) 127(L)  Potassium 3.5 - 5.1 mmol/L 3.6 4.1 3.7  Chloride 98 - 111 mmol/L 102 102 94(L)  CO2 22 - 32 mmol/L 27 21(L) 25  Calcium 8.9 - 10.3 mg/dL 6.2(Z8.7(L)  3.0(Q8.6(L) 9.1  Total Protein 6.5 - 8.1 g/dL - - 6.4(L)  Total Bilirubin 0.3 - 1.2 mg/dL - - 0.7  Alkaline Phos 38 - 126 U/L - - 50  AST 15 - 41 U/L - - 21  ALT 0 - 44 U/L - - 14   CBC Latest Ref Rng & Units 07/19/2018 07/18/2018 07/18/2018  WBC 4.0 - 10.5 K/uL 9.0 11.5(H) 10.8(H)  Hemoglobin 13.0 - 17.0 g/dL 6.5(H8.9(L) 8.4(O8.8(L) 9.6(E8.7(L)  Hematocrit 39.0 - 52.0 % 27.9(L) 28.2(L) 27.5(L)  Platelets 150 - 400 K/uL 342 302 299    RADS: N/a Assessment:   A/p lap appy, perforated  toelrating regular diet, complaining of constipation.  Added miralax today.  hospitalist consulted for increasing Cr level.  Will f/u on recs.  PT order placed by RN yesterday, but I never approved it or thought he needed, despite the reported single episode fall prior to admission

## 2018-07-20 NOTE — Discharge Summary (Signed)
Physician Discharge Summary  Patient ID: Brandon RheinRobert S Andis MRN: [id number] DOB/AGE: 62-04-1956 62 y.o.  Admit date: 07/17/2018 Discharge date: 07/20/2018  Admission Diagnoses: acute appendicitis  Discharge Diagnoses:  Same as above  Discharged Condition: good  Hospital Course: admitted for acute appendicitis, perforated. Uneventful lap appy.  Given IV abx afterwards, transitioned to augmentin to complete abx course at home.  Cr level dropped down to normal level postop, but started to increase again toward baseline Cr levels noted in records.  Curbside hospitalist consult and they recommended f/u as outpt since elevated levels seems to be baseline for patient.  Pain controlled, tolerating diet at time of discharge     Consults: None  Discharge Exam: Blood pressure 125/85, pulse 81, temperature 98.3 F (36.8 C), temperature source Oral, resp. rate 20, height 6' (1.829 m), weight 90.7 kg, SpO2 97 %. General appearance: alert, cooperative and no distress GI: soft, non-tender; bowel sounds normal; no masses,  no organomegaly Incision/Wound: port sites c/d/i.  Disposition:  Discharge disposition: 01-Home or Self Care       Discharge Instructions    Discharge patient   Complete by:  As directed    Discharge disposition:  01-Home or Self Care   Discharge patient date:  07/20/2018     Allergies as of 07/20/2018      Reactions   Compazine [prochlorperazine] Other (See Comments)   Dystonic rxn - convulsions. Near fatal reaction.   Ivp Dye [iodinated Diagnostic Agents] Shortness Of Breath   Pt states SOB after last IV contrast injection   Alcohol-sulfur [sulfur] Other (See Comments)   History of alcoholism   Benadryl [diphenhydramine] Other (See Comments)   "stuffy", nasal congestion   Depakote [divalproex Sodium] Other (See Comments)   Cause elevated ammonia   Dramamine [dimenhydrinate] Swelling   Plavix [clopidogrel] Other (See Comments)   Rectal bleeding. "Perforated my  intestines."      Medication List    STOP taking these medications   aluminum-magnesium hydroxide-simethicone 200-200-20 MG/5ML Susp Commonly known as:  MAALOX   ibuprofen 800 MG tablet Commonly known as:  ADVIL,MOTRIN   magnesium hydroxide 400 MG/5ML suspension Commonly known as:  MILK OF MAGNESIA   sucralfate 1 GM/10ML suspension Commonly known as:  CARAFATE     TAKE these medications   acetaminophen 325 MG tablet Commonly known as:  TYLENOL Take 650 mg by mouth every 4 (four) hours as needed. What changed:  Another medication with the same name was added. Make sure you understand how and when to take each.   acetaminophen 325 MG tablet Commonly known as:  TYLENOL Take 2 tablets (650 mg total) by mouth every 8 (eight) hours as needed for mild pain. What changed:  You were already taking a medication with the same name, and this prescription was added. Make sure you understand how and when to take each.   amoxicillin-clavulanate 875-125 MG tablet Commonly known as:  AUGMENTIN Take 1 tablet by mouth every 12 (twelve) hours for 8 days.   buPROPion 100 MG 12 hr tablet Commonly known as:  WELLBUTRIN SR Take 100 mg by mouth daily.   carvedilol 25 MG tablet Commonly known as:  COREG Take 25 mg by mouth 2 (two) times daily with a meal.   cloZAPine 100 MG tablet Commonly known as:  CLOZARIL Take 200 mg by mouth at bedtime.   docusate sodium 100 MG capsule Commonly known as:  COLACE Take 1 tablet once or twice daily as needed for constipation while taking narcotic  pain medicine What changed:    how much to take  how to take this  when to take this  additional instructions   ezetimibe 10 MG tablet Commonly known as:  ZETIA Take 10 mg by mouth at bedtime.   famotidine 20 MG tablet Commonly known as:  PEPCID Take 1 tablet (20 mg total) by mouth 2 (two) times daily.   fenofibrate 160 MG tablet Take 160 mg by mouth daily.   fluticasone 50 MCG/ACT nasal  spray Commonly known as:  FLONASE Place 2 sprays into both nostrils daily.   furosemide 20 MG tablet Commonly known as:  LASIX Take 20 mg by mouth 2 (two) times daily.   HYDROcodone-acetaminophen 5-325 MG tablet Commonly known as:  NORCO/VICODIN Take 1 tablet by mouth every 6 (six) hours as needed.   hydrOXYzine 25 MG capsule Commonly known as:  VISTARIL Take 25 mg by mouth 2 (two) times daily as needed for anxiety.   isosorbide mononitrate 30 MG 24 hr tablet Commonly known as:  IMDUR Take 30 mg by mouth 2 (two) times daily.   latanoprost 0.005 % ophthalmic solution Commonly known as:  XALATAN Place 1 drop into both eyes at bedtime.   nitroGLYCERIN 0.4 MG SL tablet Commonly known as:  NITROSTAT Place 0.4 mg under the tongue every 5 (five) minutes as needed for chest pain.   ondansetron 4 MG disintegrating tablet Commonly known as:  ZOFRAN-ODT Take 1 tablet (4 mg total) by mouth every 6 (six) hours as needed for nausea.   pantoprazole 40 MG tablet Commonly known as:  PROTONIX Take 40 mg by mouth 2 (two) times daily.   PARoxetine 20 MG tablet Commonly known as:  PAXIL Take 20 mg by mouth daily.   SENOKOT 8.6 MG tablet Generic drug:  senna Take 1-2 tablets by mouth daily as needed for constipation.   simvastatin 40 MG tablet Commonly known as:  ZOCOR Take 40 mg by mouth at bedtime.   SYMBICORT 80-4.5 MCG/ACT inhaler Generic drug:  budesonide-formoterol Inhale 2 puffs into the lungs 2 (two) times daily.   tamsulosin 0.4 MG Caps capsule Commonly known as:  FLOMAX Take 0.4 mg by mouth daily.   timolol 0.5 % ophthalmic solution Commonly known as:  TIMOPTIC Place 1 drop into both eyes daily.      Follow-up Information    Tonna Boehringer, Addylynn Balin, DO Follow up in 2 week(s).   Specialty:  Surgery Contact information: [address] [place] [address] [phone]        Sherron Monday, MD Follow up in 1 week(s).   Specialty:  Internal Medicine Why:   Followup for chronic creatinine elevation, s/p hospitalization for acute appendicitis Contact information: [address] [place] [address] [phone]            Total time spent arranging discharge was >48min. Signed: Sung Amabile 07/20/2018, 9:03 AM

## 2018-07-20 NOTE — Care Management (Signed)
Notified by PT on 12/19 that patient would like a RW or rollator at discharge.  Patient followed up with patient today, and he states that he is unsure that he wants either at discharge.    At this time patient does not want one at discharge. He is to notify his bedside RN if he changes his mind.  RNCM signing off.

## 2018-09-02 ENCOUNTER — Other Ambulatory Visit: Payer: Self-pay

## 2018-09-02 ENCOUNTER — Emergency Department: Payer: Medicare Other

## 2018-09-02 ENCOUNTER — Emergency Department
Admission: EM | Admit: 2018-09-02 | Discharge: 2018-09-02 | Disposition: A | Discharge status: Home / Self Care | Payer: Medicare Other | Attending: Emergency Medicine | Admitting: Emergency Medicine

## 2018-09-02 ENCOUNTER — Encounter: Payer: Self-pay | Admitting: Emergency Medicine

## 2018-09-02 DIAGNOSIS — Z79899 Other long term (current) drug therapy: Secondary | ICD-10-CM | POA: Diagnosis not present

## 2018-09-02 DIAGNOSIS — Z87891 Personal history of nicotine dependence: Secondary | ICD-10-CM | POA: Insufficient documentation

## 2018-09-02 DIAGNOSIS — J449 Chronic obstructive pulmonary disease, unspecified: Secondary | ICD-10-CM | POA: Diagnosis not present

## 2018-09-02 DIAGNOSIS — I2510 Atherosclerotic heart disease of native coronary artery without angina pectoris: Secondary | ICD-10-CM | POA: Diagnosis not present

## 2018-09-02 DIAGNOSIS — K2900 Acute gastritis without bleeding: Secondary | ICD-10-CM | POA: Diagnosis not present

## 2018-09-02 DIAGNOSIS — I10 Essential (primary) hypertension: Secondary | ICD-10-CM | POA: Insufficient documentation

## 2018-09-02 DIAGNOSIS — R1011 Right upper quadrant pain: Secondary | ICD-10-CM | POA: Diagnosis present

## 2018-09-02 DIAGNOSIS — R1013 Epigastric pain: Secondary | ICD-10-CM

## 2018-09-02 LAB — COMPREHENSIVE METABOLIC PANEL
ALBUMIN: 4.1 g/dL (ref 3.5–5.0)
ALT: 17 U/L (ref 0–44)
ANION GAP: 9 (ref 5–15)
AST: 26 U/L (ref 15–41)
Alkaline Phosphatase: 45 U/L (ref 38–126)
BUN: 10 mg/dL (ref 8–23)
CALCIUM: 9.8 mg/dL (ref 8.9–10.3)
CO2: 27 mmol/L (ref 22–32)
Chloride: 96 mmol/L — ABNORMAL LOW (ref 98–111)
Creatinine, Ser: 1.25 mg/dL — ABNORMAL HIGH (ref 0.61–1.24)
GFR calc Af Amer: >60 mL/min (ref >60)
GFR calc non Af Amer: >60 mL/min (ref >60)
Glucose, Bld: 108 mg/dL — ABNORMAL HIGH (ref 70–99)
Potassium: 3.9 mmol/L (ref 3.5–5.1)
Sodium: 132 mmol/L — ABNORMAL LOW (ref 135–145)
Total Bilirubin: 0.8 mg/dL (ref 0.3–1.2)
Total Protein: 7.2 g/dL (ref 6.5–8.1)

## 2018-09-02 LAB — URINALYSIS, COMPLETE (UACMP) WITH MICROSCOPIC
BILIRUBIN URINE: NEGATIVE
Bacteria, UA: NONE SEEN
Glucose, UA: NEGATIVE mg/dL
Hgb urine dipstick: NEGATIVE
Ketones, ur: NEGATIVE mg/dL
Leukocytes, UA: NEGATIVE
Nitrite: NEGATIVE
Protein, ur: NEGATIVE mg/dL
Specific Gravity, Urine: 1.009 (ref 1.005–1.030)
pH: 6 (ref 5.0–8.0)

## 2018-09-02 LAB — CBC
HCT: 36 % — ABNORMAL LOW (ref 39.0–52.0)
Hemoglobin: 11.2 g/dL — ABNORMAL LOW (ref 13.0–17.0)
MCH: 25.5 pg — AB (ref 26.0–34.0)
MCHC: 31.1 g/dL (ref 30.0–36.0)
MCV: 82 fL (ref 80.0–100.0)
Platelets: 445 10*3/uL — ABNORMAL HIGH (ref 150–400)
RBC: 4.39 MIL/uL (ref 4.22–5.81)
RDW: 14.6 % (ref 11.5–15.5)
WBC: 9.2 10*3/uL (ref 4.0–10.5)
nRBC: 0 % (ref 0.0–0.2)

## 2018-09-02 LAB — LIPASE, BLOOD: Lipase: 33 U/L (ref 11–51)

## 2018-09-02 MED ORDER — ONDANSETRON 4 MG PO TBDP
8.0000 mg | ORAL_TABLET | Freq: Once | ORAL | Status: AC
  Administered 2018-09-02: 8 mg via ORAL
  Filled 2018-09-02: qty 2

## 2018-09-02 MED ORDER — ALUMINUM-MAGNESIUM-SIMETHICONE 200-200-20 MG/5ML PO SUSP: 30.0000 mL | Freq: Three times a day (TID) | ORAL | 0 refills | Status: DC

## 2018-09-02 MED ORDER — ONDANSETRON 4 MG PO TBDP: 4.0000 mg | ORAL_TABLET | Freq: Three times a day (TID) | ORAL | 0 refills | Status: DC | PRN

## 2018-09-02 MED ORDER — SODIUM CHLORIDE 0.9% FLUSH: 3.0000 mL | Freq: Once | INTRAVENOUS | Status: DC

## 2018-09-02 MED ORDER — TRAMADOL HCL 50 MG PO TABS: 50.0000 mg | ORAL_TABLET | Freq: Four times a day (QID) | ORAL | 0 refills | Status: DC | PRN

## 2018-09-02 NOTE — ED Triage Notes (Signed)
Pt here with c/o right upper abd pain for the past few days now, has had some nausea and vomiting, denies diarrhea. Appears in NAD. States gall bladder and appendix have been removed.

## 2018-09-02 NOTE — ED Notes (Signed)
Return from CT.  AAOx3.  Skin warm and dry. NAD 

## 2018-09-02 NOTE — ED Provider Notes (Signed)
[facility] Emergency Department Provider Note  ____________________________________________  Time seen: Approximately 5:03 PM  I have reviewed the triage vital signs and the nursing notes.   HISTORY  Chief Complaint Abdominal Pain    HPI Brandon [name] is a 63 y.o. male with a history of alcohol abuse hypertension hyperlipidemia and schizophrenia who comes the ED complaining of upper abdominal pain, gradual onset for the past 7 days.  He does further elaborate that is been present since his appendix surgery a month ago but worse over the past week.  No aggravating or alleviating factors.  Associated with nausea and vomiting today, no diarrhea.  No fevers or chills.  Pain is waxing and waning, moderate intensity, possibly improved by Pepto-Bismol that he took one time.      Past Medical History:  Diagnosis Date  . Anemia    IDA  . Colon polyps   . COPD (chronic obstructive pulmonary disease) (HCC)   . Coronary artery disease   . Heart attack (HCC) 2005  . Hiatal hernia   . History of ETOH abuse   . Hyperlipidemia   . Hypertension   . Pleurisy   . Schizophrenia (HCC)   . Schizophrenia (HCC)   . Seizures (HCC)    grand mal in 2000 or 2001 recovery from alcoholism  . Suicide attempt [facility])      Patient Active Problem List   Diagnosis Date Noted  . Appendicitis with perforation 07/17/2018  . Acute cholecystitis 04/10/2018  . Chronic cholecystitis 04/08/2018  . Atherosclerosis of native arteries of extremity with intermittent claudication (HCC) 11/22/2017  . Essential hypertension 11/22/2017  . Hyperlipidemia 11/22/2017  . COPD (chronic obstructive pulmonary disease) (HCC) 11/22/2017  . CAD (coronary artery disease) 10/15/2017     Past Surgical History:  Procedure Laterality Date  . ABDOMINAL SURGERY     internal bleeding  . CHOLECYSTECTOMY N/A 05/04/2018   Procedure: LAPAROSCOPIC CHOLECYSTECTOMY, converted to open;  Surgeon: Sung Amabile,  DO;  Location: [facility] ORS;  Service: General;  Laterality: N/A;  . COLONOSCOPY    . COLONOSCOPY WITH PROPOFOL N/A 05/09/2016   Procedure: COLONOSCOPY WITH PROPOFOL;  Surgeon: Scot Jun, MD;  Location: [facility] ENDOSCOPY;  Service: Endoscopy;  Laterality: N/A;  . CORONARY ANGIOPLASTY WITH STENT PLACEMENT     1 vessel  . ESOPHAGOGASTRODUODENOSCOPY (EGD) WITH PROPOFOL N/A 05/09/2016   Procedure: ESOPHAGOGASTRODUODENOSCOPY (EGD) WITH PROPOFOL;  Surgeon: Scot Jun, MD;  Location: [facility] ENDOSCOPY;  Service: Endoscopy;  Laterality: N/A;  . EYE SURGERY Right   . GLAUCOMA SURGERY    . LAPAROSCOPIC APPENDECTOMY N/A 07/17/2018   Procedure: APPENDECTOMY LAPAROSCOPIC;  Surgeon: Sung Amabile, DO;  Location: [facility] ORS;  Service: General;  Laterality: N/A;  . LEFT HEART CATH Right 10/19/2017   Procedure: Left Heart Cath and Coronary Angiography;  Surgeon: Laurier Nancy, MD;  Location: [facility] INVASIVE CV LAB;  Service: Cardiovascular;  Laterality: Right;  . NOSE SURGERY    . 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 VASECTOMY       Prior to Admission medications   Medication Sig Start Date End Date Taking? Authorizing Provider  acetaminophen (TYLENOL) 325 MG tablet Take 650 mg by mouth every 4 (four) hours as needed.    [provider]  aluminum-magnesium hydroxide-simethicone (MAALOX) 200-200-20 MG/5ML SUSP Take 30 mLs by mouth 4 (four) times daily -  before meals and at bedtime. 09/02/18   Sharman Cheek, MD  buPROPion [facility] SR) 100 MG 12 hr  tablet Take 100 mg by mouth daily.     [provider]  carvedilol (COREG) 25 MG tablet Take 25 mg by mouth 2 (two) times daily with a meal.    [provider]  cloZAPine (CLOZARIL) 100 MG tablet Take 200 mg by mouth at bedtime.     [provider]  docusate sodium (COLACE) 100 MG capsule Take 1 tablet once or twice daily as needed for constipation while taking narcotic pain medicine Patient taking differently: Take 100 mg by  mouth daily.  04/23/18   Loleta RoseForbach, Cory, MD  ezetimibe (ZETIA) 10 MG tablet Take 10 mg by mouth at bedtime.     [provider]  famotidine (PEPCID) 20 MG tablet Take 1 tablet (20 mg total) by mouth 2 (two) times daily. 06/15/18   Sharman CheekStafford, Knolan Simien, MD  fenofibrate 160 MG tablet Take 160 mg by mouth daily.    [provider]  fluticasone (FLONASE) 50 MCG/ACT nasal spray Place 2 sprays into both nostrils daily.     [provider]  furosemide (LASIX) 20 MG tablet Take 20 mg by mouth 2 (two) times daily.    [provider]  HYDROcodone-acetaminophen (NORCO/VICODIN) 5-325 MG tablet Take 1 tablet by mouth every 6 (six) hours as needed. 06/23/18   [provider]  hydrOXYzine (VISTARIL) 25 MG capsule Take 25 mg by mouth 2 (two) times daily as needed for anxiety.    [provider]  isosorbide mononitrate (IMDUR) 30 MG 24 hr tablet Take 30 mg by mouth 2 (two) times daily.     [provider]  latanoprost (XALATAN) 0.005 % ophthalmic solution Place 1 drop into both eyes at bedtime. 09/15/17   [provider]  nitroGLYCERIN (NITROSTAT) 0.4 MG SL tablet Place 0.4 mg under the tongue every 5 (five) minutes as needed for chest pain.     [provider]  ondansetron (ZOFRAN ODT) 4 MG disintegrating tablet Take 1 tablet (4 mg total) by mouth every 8 (eight) hours as needed for nausea or vomiting. 09/02/18   Sharman CheekStafford, Tiyonna Sardinha, MD  pantoprazole (PROTONIX) 40 MG tablet Take 40 mg by mouth 2 (two) times daily.    [provider]  PARoxetine (PAXIL) 20 MG tablet Take 20 mg by mouth daily.    [provider]  senna (SENOKOT) 8.6 MG tablet Take 1-2 tablets by mouth daily as needed for constipation.    [provider]  simvastatin (ZOCOR) 40 MG tablet Take 40 mg by mouth at bedtime.     [provider]  SYMBICORT 80-4.5 MCG/ACT inhaler Inhale 2 puffs into the lungs 2 (two) times daily. 09/15/17   [provider]  tamsulosin (FLOMAX) 0.4 MG CAPS capsule Take 0.4 mg by mouth daily.     [provider]  timolol (TIMOPTIC) 0.5 % ophthalmic solution Place 1 drop into both eyes daily. 09/15/17   [provider]  traMADol (ULTRAM) 50 MG tablet Take 1 tablet (50 mg total) by mouth every 6 (six) hours as needed. 09/02/18   Sharman CheekStafford, Alyn Riedinger, MD     Allergies Compazine [prochlorperazine]; Ivp dye [iodinated diagnostic agents]; Alcohol-sulfur [sulfur]; Benadryl [diphenhydramine]; Depakote [divalproex sodium]; Dramamine [dimenhydrinate]; and Plavix [clopidogrel]   Family History  Problem Relation Age of Onset  . Lung cancer Mother   . Hypertension Father   . Heart attack Father   . CAD Father   . Prostate cancer Neg Hx   . Bladder Cancer Neg Hx   . Kidney cancer Neg  Hx     Social History Social History   Tobacco Use  . Smoking status: Former Smoker    Packs/day: 1.00    Types: Cigarettes    Last attempt to quit: 2012    Years since quitting: 8.0  . Smokeless tobacco: Former Engineer, water Use Topics  . Alcohol use: No    Comment: no alcohol since 2010  . Drug use: No    Review of Systems  Constitutional:   No fever or chills.  ENT:   No sore throat. No rhinorrhea. Cardiovascular:   No chest pain or syncope. Respiratory:   No dyspnea or cough. Gastrointestinal: Positive as above for upper abdominal pain and vomiting.  No diarrhea or constipation  musculoskeletal:   Negative for focal pain or swelling All other systems reviewed and are negative except as documented above in ROS and HPI.  ____________________________________________   PHYSICAL EXAM:  VITAL SIGNS: ED Triage Vitals  Enc Vitals Group     BP 09/02/18 1407 121/81     Pulse Rate 09/02/18 1407 95     Resp 09/02/18 1407 18     Temp 09/02/18 1407 (!) 97.5 F (36.4 C)     Temp Source 09/02/18 1407 Oral     SpO2 09/02/18 1407 100 %     Weight 09/02/18 1405 200 lb (90.7 kg)     Height  09/02/18 1405 6' (1.829 m)     Head Circumference --      Peak Flow --      Pain Score 09/02/18 1405 8     Pain Loc --      Pain Edu? --      Excl. in GC? --     Vital signs reviewed, nursing assessments reviewed.   Constitutional:   Alert and oriented. Non-toxic appearance. Eyes:   Conjunctivae are normal. EOMI. PERRL. ENT      Head:   Normocephalic and atraumatic.      Nose:   No congestion/rhinnorhea.       Mouth/Throat:   MMM, no pharyngeal erythema. No peritonsillar mass.       Neck:   No meningismus. Full ROM. Hematological/Lymphatic/Immunilogical:   No cervical lymphadenopathy. Cardiovascular:   RRR. Symmetric bilateral radial and DP pulses.  No murmurs. Cap refill less than 2 seconds. Respiratory:   Normal respiratory effort without tachypnea/retractions. Breath sounds are clear and equal bilaterally. No wheezes/rales/rhonchi. Gastrointestinal:   Soft with tenderness in the epigastrium.  Non distended. There is no CVA tenderness.  No rebound, rigidity, or guarding. Musculoskeletal:   Normal range of motion in all extremities. No joint effusions.  No lower extremity tenderness.  No edema. Neurologic:   Normal speech and language.  Motor grossly intact. No acute focal neurologic deficits are appreciated.  Skin:    Skin is warm, dry and intact. No rash noted.  No petechiae, purpura, or bullae.  ____________________________________________    LABS (pertinent positives/negatives) (all labs ordered are listed, but only abnormal results are displayed) Labs Reviewed  COMPREHENSIVE METABOLIC PANEL - Abnormal; Notable for the following components:      Result Value   Sodium 132 (*)    Chloride 96 (*)    Glucose, Bld 108 (*)    Creatinine, Ser 1.25 (*)    All other components within normal limits  CBC - Abnormal; Notable for the following components:   Hemoglobin 11.2 (*)    HCT 36.0 (*)    MCH 25.5 (*)    Platelets 445 (*)  All other components within normal limits   URINALYSIS, COMPLETE (UACMP) WITH MICROSCOPIC - Abnormal; Notable for the following components:   Color, Urine YELLOW (*)    APPearance CLEAR (*)    All other components within normal limits  LIPASE, BLOOD   ____________________________________________   EKG  Interpreted by me Sinus rhythm rate of 93, normal axis and intervals.  Normal QRS ST segments and T waves.  ____________________________________________    RADIOLOGY  Ct Abdomen Pelvis Wo Contrast  Result Date: 09/02/2018 CLINICAL DATA:  RIGHT upper quadrant abdominal pain for a few days now with nausea and vomiting, prior cholecystectomy and appendectomy, history COPD, coronary artery disease post MI, hypertension, ethanol abuse EXAM: CT ABDOMEN AND PELVIS WITHOUT CONTRAST TECHNIQUE: Multidetector CT imaging of the abdomen and pelvis was performed following the standard protocol without IV contrast. Sagittal and coronal MPR images reconstructed from axial data set. Oral contrast was not administered. COMPARISON:  07/17/2018 FINDINGS: Lower chest: Lung bases clear Hepatobiliary: Gallbladder surgically absent with staple line at gallbladder fossa. Question minimally nodular hepatic margins raising question of cirrhosis. Pancreas: Normal appearance Spleen: Normal appearance.  Small splenule anterior to spleen. Adrenals/Urinary Tract: Adrenal glands normal appearance. BILATERAL renal cysts larger on RIGHT, largest RIGHT renal cyst anteriorly 3.3 x 2.8 cm. No hydronephrosis. No urinary tract calcification. Anterior bladder wall thickening unchanged since previous exam. Stomach/Bowel: Appendix surgically absent by history. Interposition of bowel between liver and diaphragm. Descending and sigmoid diverticulosis without evidence of diverticulitis. Stomach and remaining bowel loops normal appearance. Vascular/Lymphatic: Atherosclerotic calcifications of aorta, iliac arteries, femoral arteries and visceral arteries as well as in coronary  arteries. Aorta normal caliber. No adenopathy. Reproductive: Minimal prostatic enlargement gland measuring 4.6 x 3.5 cm image 84. Other: No free air free fluid. No hernia or acute inflammatory process. Musculoskeletal: Mild degenerative disc and facet disease changes lumbar spine. Diffusely bulging versus broad-based disc herniation of L4-L5 disc, flattening and indenting thecal sac. IMPRESSION: Question minimally nodular hepatic margins which could reflect cirrhosis. Distal colonic diverticulosis without evidence of diverticulitis. BILATERAL renal cysts. Chronic anterior bladder wall thickening of uncertain etiology. Minimal prostatic enlargement. Bulging versus broad-based disc herniation at L4-L5 disc. Electronically Signed   By: Ulyses Southward M.D.   On: 09/02/2018 15:41    ____________________________________________   PROCEDURES Procedures  ____________________________________________  DIFFERENTIAL DIAGNOSIS   Pancreatitis, gastritis, gallbladder remnant lithiasis, choledocholithiasis  CLINICAL IMPRESSION / ASSESSMENT AND PLAN / ED COURSE  Pertinent labs & imaging results that were available during my care of the patient were reviewed by me and considered in my medical decision making (see chart for details).    Patient presents with upper abdominal pain.  Extensive electronic medical record review undertaken, which shows he recently had appendicitis complicated by perforation.  Also 4 months ago had cholecystitis with extensive adhesions in the upper abdomen and inability to fully resect the gallbladder resulting in a partial cholecystectomy.  Given the symptoms and recent surgeries will need CT scan to further evaluate.  Vital signs and labs are okay, patient is nontoxic but with epigastric tenderness.  Clinical Course as of Sep 02 1701  Thu Sep 02, 2018  1658 Feeling better.  CT unremarkable.  Continue on GI regimen.  Short course tramadol to use as needed, drug database reviewed    [PS]    Clinical Course User Index [PS] Sharman Cheek, MD     ____________________________________________   FINAL CLINICAL IMPRESSION(S) / ED DIAGNOSES    Final diagnoses:  Epigastric pain  Acute  gastritis without hemorrhage, unspecified gastritis type     ED Discharge Orders         Ordered    aluminum-magnesium hydroxide-simethicone (MAALOX) 200-200-20 MG/5ML SUSP  3 times daily before meals & bedtime     09/02/18 1702    ondansetron (ZOFRAN ODT) 4 MG disintegrating tablet  Every 8 hours PRN     09/02/18 1702    traMADol (ULTRAM) 50 MG tablet  Every 6 hours PRN     09/02/18 1702          Portions of this note were generated with dragon dictation software. Dictation errors may occur despite best attempts at proofreading.   Sharman Cheek, MD 09/02/18 [phone]

## 2018-09-02 NOTE — ED Notes (Signed)
AAOx3.  Skin warm and dry.  NAD 

## 2018-09-02 NOTE — Discharge Instructions (Signed)
Your labs and CT scan today were okay.  Please follow-up with your doctor to continue monitoring your symptoms.

## 2019-06-20 ENCOUNTER — Encounter (INDEPENDENT_AMBULATORY_CARE_PROVIDER_SITE_OTHER): Payer: Medicare Other

## 2019-06-20 ENCOUNTER — Ambulatory Visit (INDEPENDENT_AMBULATORY_CARE_PROVIDER_SITE_OTHER): Payer: Medicare Other | Admitting: Vascular Surgery

## 2019-07-14 ENCOUNTER — Ambulatory Visit (INDEPENDENT_AMBULATORY_CARE_PROVIDER_SITE_OTHER): Payer: Medicare Other | Admitting: Vascular Surgery

## 2019-07-14 ENCOUNTER — Encounter (INDEPENDENT_AMBULATORY_CARE_PROVIDER_SITE_OTHER): Payer: Medicare Other

## 2019-07-19 NOTE — Progress Notes (Signed)
MRN : [id number]  Brandon RheinRobert S [name] is a 63 y.o. (1956-05-21) male who presents with chief complaint of No chief complaint on file. Marland Kitchen.  History of Present Illness:   The patient returns to the office for followup and review of the noninvasive studies. There have been no interval changes in lower extremity symptoms. No interval shortening of the patient's claudication distance or development of rest pain symptoms. No new ulcers or wounds have occurred since the last visit.  There have been no significant changes to the patient's overall health care.  The patient denies amaurosis fugax or recent TIA symptoms. There are no recent neurological changes noted. The patient denies history of DVT, PE or superficial thrombophlebitis. The patient denies recent episodes of angina or shortness of breath.   ABI Rt=0.95 and Lt=1.07  (previous ABI's Rt=1.17 and Lt=1.18 triphasic waveforms bilaterally)  No outpatient medications have been marked as taking for the 07/21/19 encounter (Appointment) with Gilda CreaseSchnier, Latina CraverGregory G, MD.    Past Medical History:  Diagnosis Date  . Anemia    IDA  . Colon polyps   . COPD (chronic obstructive pulmonary disease) (HCC)   . Coronary artery disease   . Heart attack (HCC) 2005  . Hiatal hernia   . History of ETOH abuse   . Hyperlipidemia   . Hypertension   . Pleurisy   . Schizophrenia (HCC)   . Schizophrenia (HCC)   . Seizures (HCC)    grand mal in 2000 or 2001 recovery from alcoholism  . Suicide attempt [facility](HCC)     Past Surgical History:  Procedure Laterality Date  . ABDOMINAL SURGERY     internal bleeding  . CHOLECYSTECTOMY N/A 05/04/2018   Procedure: LAPAROSCOPIC CHOLECYSTECTOMY, converted to open;  Surgeon: Sung AmabileSakai, Isami, DO;  Location: [facility] ORS;  Service: General;  Laterality: N/A;  . COLONOSCOPY    . COLONOSCOPY WITH PROPOFOL N/A 05/09/2016   Procedure: COLONOSCOPY WITH PROPOFOL;  Surgeon: Scot Junobert T Elliott, MD;  [facility];  Service:  Endoscopy;  Laterality: N/A;  . CORONARY ANGIOPLASTY WITH STENT PLACEMENT     1 vessel  . ESOPHAGOGASTRODUODENOSCOPY (EGD) WITH PROPOFOL N/A 05/09/2016   Procedure: ESOPHAGOGASTRODUODENOSCOPY (EGD) WITH PROPOFOL;  Surgeon: Scot Junobert T Elliott, MD;  [facility];  Service: Endoscopy;  Laterality: N/A;  . EYE SURGERY Right   . GLAUCOMA SURGERY    . LAPAROSCOPIC APPENDECTOMY N/A 07/17/2018   Procedure: APPENDECTOMY LAPAROSCOPIC;  Surgeon: Sung AmabileSakai, Isami, DO;  Location: [facility] ORS;  Service: General;  Laterality: N/A;  . LEFT HEART CATH Right 10/19/2017   Procedure: Left Heart Cath and Coronary Angiography;  Surgeon: Laurier NancyKhan, Shaukat A, MD;  [facility];  Service: Cardiovascular;  Laterality: Right;  . NOSE SURGERY    . 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 VASECTOMY      Social History Social History   Tobacco Use  . Smoking status: Former Smoker    Packs/day: 1.00    Types: Cigarettes    Quit date: 2012    Years since quitting: 8.9  . Smokeless tobacco: Former Engineer, waterUser  Substance Use Topics  . Alcohol use: No    Comment: no alcohol since 2010  . Drug use: No    Family History Family History  Problem Relation Age of Onset  . Lung cancer Mother   . Hypertension Father   . Heart attack Father   . CAD Father   . Prostate cancer Neg Hx   . Bladder Cancer  Neg Hx   . Kidney cancer Neg Hx     Allergies  Allergen Reactions  . Compazine [Prochlorperazine] Other (See Comments)    Dystonic rxn - convulsions. Near fatal reaction.   Ardine Bjork [Iodinated Diagnostic Agents] Shortness Of Breath    Pt states SOB after last IV contrast injection  . Alcohol-Sulfur [Sulfur] Other (See Comments)    History of alcoholism  . Benadryl [Diphenhydramine] Other (See Comments)    "stuffy", nasal congestion  . Depakote [Divalproex Sodium] Other (See Comments)    Cause elevated ammonia  . Dramamine [Dimenhydrinate] Swelling  . Plavix [Clopidogrel] Other (See Comments)    Rectal  bleeding. "Perforated my intestines."     REVIEW OF SYSTEMS (Negative unless checked)  Constitutional: [] Weight loss  [] Fever  [] Chills Cardiac: [] Chest pain   [] Chest pressure   [] Palpitations   [] Shortness of breath when laying flat   [] Shortness of breath with exertion. Vascular:  [x] Pain in legs with walking   [] Pain in legs at rest  [] History of DVT   [] Phlebitis   [] Swelling in legs   [] Varicose veins   [] Non-healing ulcers Pulmonary:   [] Uses home oxygen   [] Productive cough   [] Hemoptysis   [] Wheeze  [] COPD   [] Asthma Neurologic:  [] Dizziness   [] Seizures   [] History of stroke   [] History of TIA  [] Aphasia   [] Vissual changes   [] Weakness or numbness in arm   [] Weakness or numbness in leg Musculoskeletal:   [] Joint swelling   [] Joint pain   [] Low back pain Hematologic:  [] Easy bruising  [] Easy bleeding   [] Hypercoagulable state   [] Anemic Gastrointestinal:  [] Diarrhea   [] Vomiting  [] Gastroesophageal reflux/heartburn   [] Difficulty swallowing. Genitourinary:  [] Chronic kidney disease   [] Difficult urination  [] Frequent urination   [] Blood in urine Skin:  [] Rashes   [] Ulcers  Psychological:  [] History of anxiety   []  History of major depression.  Physical Examination  There were no vitals filed for this visit. There is no height or weight on file to calculate BMI. Gen: WD/WN, NAD Head: [place]/AT, No temporalis wasting.  Ear/Nose/Throat: Hearing grossly intact, nares w/o erythema or drainage Eyes: PER, EOMI, sclera nonicteric.  Neck: Supple, no large masses.   Pulmonary:  Good air movement, no audible wheezing bilaterally, no use of accessory muscles.  Cardiac: RRR, no JVD Vascular:  Vessel Right Left  Radial Palpable Palpable  PT Trace Palpable Palpable  DP Trace Palpable Palpable  Gastrointestinal: Non-distended. No guarding/no peritoneal signs.  Musculoskeletal: M/S 5/5 throughout.  No deformity or atrophy.  Neurologic: CN 2-12 intact. Symmetrical.  Speech is fluent. Motor  exam as listed above. Psychiatric: Judgment intact, Mood & affect appropriate for pt's clinical situation. Dermatologic: No rashes or ulcers noted.  No changes consistent with cellulitis. Lymph : No lichenification or skin changes of chronic lymphedema.  CBC Lab Results  Component Value Date   WBC 9.2 09/02/2018   HGB 11.2 (L) 09/02/2018   HCT 36.0 (L) 09/02/2018   MCV 82.0 09/02/2018   PLT 445 (H) 09/02/2018    BMET    Component Value Date/Time   NA 132 (L) 09/02/2018 1419   K 3.9 09/02/2018 1419   CL 96 (L) 09/02/2018 1419   CO2 27 09/02/2018 1419   GLUCOSE 108 (H) 09/02/2018 1419   BUN 10 09/02/2018 1419   CREATININE 1.25 (H) 09/02/2018 1419   CALCIUM 9.8 09/02/2018 1419   GFRNONAA >60 09/02/2018 1419   GFRAA >60 09/02/2018 1419   CrCl cannot  be calculated (Patient's most recent lab result is older than the maximum 21 days allowed.).  COAG Lab Results  Component Value Date   INR 0.86 07/17/2018   INR 0.93 05/09/2016    Radiology No results found.   Assessment/Plan 1. Atherosclerosis of native artery of both lower extremities with intermittent claudication (HCC) Recommend:  The patient has evidence of atherosclerosis of the lower extremities with claudication.  The patient does not voice lifestyle limiting changes at this point in time.  He does not have any ulcerations and he is denying rest pain.  Given that he does not feel that his claudication symptoms are having a profound negative impact he has decided not to pursue intervention at this time.  No invasive studies, angiography or surgery at this time The patient should continue walking and begin a more formal exercise program.  The patient should continue antiplatelet therapy and aggressive treatment of the lipid abnormalities  No changes in the patient's medications at this time  The patient should continue wearing graduated compression socks 10-15 mmHg strength to control the mild edema.   - ABI;  Future  2. Coronary artery disease of native artery of native heart with stable angina pectoris (HCC) Continue cardiac and antihypertensive medications as already ordered and reviewed, no changes at this time.  Continue statin as ordered and reviewed, no changes at this time  Nitrates PRN for chest pain   3. Essential hypertension Continue antihypertensive medications as already ordered, these medications have been reviewed and there are no changes at this time.   4. Chronic obstructive pulmonary disease, unspecified COPD type ([place]) Continue pulmonary medications and aerosols as already ordered, these medications have been reviewed and there are no changes at this time.    5. Mixed hyperlipidemia Continue statin as ordered and reviewed, no changes at this time     Hortencia Pilar, MD  07/19/2019 12:58 PM

## 2019-07-21 ENCOUNTER — Ambulatory Visit (INDEPENDENT_AMBULATORY_CARE_PROVIDER_SITE_OTHER): Payer: Medicare Other | Admitting: Vascular Surgery

## 2019-07-21 ENCOUNTER — Encounter (INDEPENDENT_AMBULATORY_CARE_PROVIDER_SITE_OTHER): Payer: Self-pay | Admitting: Vascular Surgery

## 2019-07-21 ENCOUNTER — Ambulatory Visit (INDEPENDENT_AMBULATORY_CARE_PROVIDER_SITE_OTHER): Payer: Medicare Other

## 2019-07-21 ENCOUNTER — Other Ambulatory Visit: Payer: Self-pay

## 2019-07-21 VITALS — BP 169/88 | HR 86 | Resp 16 | Ht 72.0 in | Wt 218.0 lb

## 2019-07-21 DIAGNOSIS — I70213 Atherosclerosis of native arteries of extremities with intermittent claudication, bilateral legs: Secondary | ICD-10-CM

## 2019-07-21 DIAGNOSIS — E782 Mixed hyperlipidemia: Secondary | ICD-10-CM

## 2019-07-21 DIAGNOSIS — I25118 Atherosclerotic heart disease of native coronary artery with other forms of angina pectoris: Secondary | ICD-10-CM | POA: Diagnosis not present

## 2019-07-21 DIAGNOSIS — I10 Essential (primary) hypertension: Secondary | ICD-10-CM

## 2019-07-21 DIAGNOSIS — J449 Chronic obstructive pulmonary disease, unspecified: Secondary | ICD-10-CM

## 2019-08-10 ENCOUNTER — Encounter (INDEPENDENT_AMBULATORY_CARE_PROVIDER_SITE_OTHER): Payer: Self-pay | Admitting: Vascular Surgery

## 2019-12-17 ENCOUNTER — Ambulatory Visit: Payer: Medicare Other | Attending: Internal Medicine

## 2020-04-17 ENCOUNTER — Other Ambulatory Visit: Payer: Self-pay

## 2020-04-17 ENCOUNTER — Ambulatory Visit (INDEPENDENT_AMBULATORY_CARE_PROVIDER_SITE_OTHER): Payer: Medicare Other | Admitting: Podiatry

## 2020-04-17 DIAGNOSIS — L97522 Non-pressure chronic ulcer of other part of left foot with fat layer exposed: Secondary | ICD-10-CM

## 2020-04-17 NOTE — Progress Notes (Signed)
   Subjective:  64 y.o. male with PSxHx of amputation of the left second toe presents the office today for evaluation of a painful callus lesion to the plantar aspect of the third MTPJ of the left foot.  Patient states the lesion has been very painful over the last 2 months.  He denies a history of injury to the area.  He denies any history of diabetes.  He presents for further treatment and evaluation   Past Medical History:  Diagnosis Date  . Anemia    IDA  . Colon polyps   . COPD (chronic obstructive pulmonary disease) (HCC)   . Coronary artery disease   . Heart attack (HCC) 2005  . Hiatal hernia   . History of ETOH abuse   . Hyperlipidemia   . Hypertension   . Pleurisy   . Schizophrenia (HCC)   . Schizophrenia (HCC)   . Seizures (HCC)    grand mal in 2000 or 2001 recovery from alcoholism  . Suicide attempt [facility])       Objective/Physical Exam General: The patient is alert and oriented x3 in no acute distress.  Dermatology:  Wound #1 noted to the plantar aspect of the third MTPJ of the left foot measuring approximately 0.2 x 0.2 x 0.2 cm (LxWxD).   To the noted ulceration(s), there is no eschar. There is a moderate amount of slough, fibrin, and necrotic tissue noted. Granulation tissue and wound base is red. There is a minimal amount of serosanguineous drainage noted. There is no exposed bone muscle-tendon ligament or joint. There is no malodor. Periwound integrity is intact.  Prior to debridement there was an extensive amount of hyperkeratotic tissue and callus overlying the ulcer Skin is warm, dry and supple bilateral lower extremities.  Vascular: Palpable pedal pulses bilaterally. No edema or erythema noted. Capillary refill within normal limits.  Neurological: Epicritic and protective threshold diminished bilaterally.   Musculoskeletal Exam: Range of motion within normal limits to all pedal and ankle joints bilateral. Muscle strength 5/5 in all groups bilateral.  Absence  of the left second toe noted  Assessment: 1.  History of left second toe amputation 2.  Ulcer left third MTPJ with fat layer exposed   Plan of Care:  1. Patient was evaluated. 2. medically necessary excisional debridement including subcutaneous tissue was performed using a tissue nipper and a chisel blade. Excisional debridement of all the necrotic nonviable tissue down to healthy bleeding viable tissue was performed with post-debridement measurements same as pre-. 3. the wound was cleansed and dry sterile dressing applied. 4.  Recommend antibiotic ointment and a light dressing daily 5.  Return to clinic in 4 weeks   Felecia Shelling, DPM [facility]  Dr. Felecia Shelling, DPM    [address]                                        [place], [place] [address]                Office [phone]  Fax [phone]

## 2020-04-27 ENCOUNTER — Ambulatory Visit: Admit: 2020-04-27 | Payer: Medicare Other | Admitting: Internal Medicine

## 2020-04-27 SURGERY — EGD (ESOPHAGOGASTRODUODENOSCOPY)
Anesthesia: General

## 2020-05-01 ENCOUNTER — Ambulatory Visit: Payer: Self-pay

## 2020-05-01 NOTE — Telephone Encounter (Signed)
Patient called, left VM to return the call to [phone].  Summary: Call back    Pt would like a call back from nurses, pt's wife is currently in the ER. He has questions that he wants to ask nurses  Best contact: [phone]

## 2020-05-01 NOTE — Telephone Encounter (Signed)
Pt calling regarding wife, not present, in [facility]  ED. States she had asked him to call [facility] to ask what her patient rights are. States he knows she is getting the best care "But she doesn't agree with that sometimes." Advised to have pt ask for her case manager as best resource. Verbalizes understanding. Answer Assessment - Initial Assessment Questions 1. REASON FOR CALL or QUESTION: "What is your reason for calling today?" or "How can I best help you?" or "What question do you have that I can help answer?"     Patient rights  Protocols used: INFORMATION ONLY CALL - NO TRIAGE-A-AH

## 2020-05-05 ENCOUNTER — Other Ambulatory Visit: Payer: Self-pay

## 2020-05-05 ENCOUNTER — Emergency Department
Admission: EM | Admit: 2020-05-05 | Discharge: 2020-05-06 | Disposition: A | Discharge status: Home / Self Care | Payer: Medicare Other | Attending: Emergency Medicine | Admitting: Emergency Medicine

## 2020-05-05 ENCOUNTER — Emergency Department: Payer: Medicare Other

## 2020-05-05 DIAGNOSIS — Z87891 Personal history of nicotine dependence: Secondary | ICD-10-CM | POA: Insufficient documentation

## 2020-05-05 DIAGNOSIS — Y9389 Activity, other specified: Secondary | ICD-10-CM | POA: Insufficient documentation

## 2020-05-05 DIAGNOSIS — Y92480 Sidewalk as the place of occurrence of the external cause: Secondary | ICD-10-CM | POA: Diagnosis not present

## 2020-05-05 DIAGNOSIS — W01198A Fall on same level from slipping, tripping and stumbling with subsequent striking against other object, initial encounter: Secondary | ICD-10-CM | POA: Diagnosis not present

## 2020-05-05 DIAGNOSIS — W19XXXA Unspecified fall, initial encounter: Secondary | ICD-10-CM

## 2020-05-05 DIAGNOSIS — Z79899 Other long term (current) drug therapy: Secondary | ICD-10-CM | POA: Insufficient documentation

## 2020-05-05 DIAGNOSIS — E871 Hypo-osmolality and hyponatremia: Secondary | ICD-10-CM

## 2020-05-05 DIAGNOSIS — S0181XA Laceration without foreign body of other part of head, initial encounter: Secondary | ICD-10-CM

## 2020-05-05 DIAGNOSIS — I10 Essential (primary) hypertension: Secondary | ICD-10-CM | POA: Diagnosis not present

## 2020-05-05 DIAGNOSIS — I2510 Atherosclerotic heart disease of native coronary artery without angina pectoris: Secondary | ICD-10-CM | POA: Diagnosis not present

## 2020-05-05 DIAGNOSIS — S298XXA Other specified injuries of thorax, initial encounter: Secondary | ICD-10-CM

## 2020-05-05 DIAGNOSIS — J449 Chronic obstructive pulmonary disease, unspecified: Secondary | ICD-10-CM | POA: Insufficient documentation

## 2020-05-05 LAB — BASIC METABOLIC PANEL
Anion gap: 7 (ref 5–15)
BUN: 7 mg/dL — ABNORMAL LOW (ref 8–23)
CO2: 27 mmol/L (ref 22–32)
Calcium: 8.7 mg/dL — ABNORMAL LOW (ref 8.9–10.3)
Chloride: 95 mmol/L — ABNORMAL LOW (ref 98–111)
Creatinine, Ser: 0.96 mg/dL (ref 0.61–1.24)
GFR calc Af Amer: >60 mL/min (ref >60)
GFR calc non Af Amer: >60 mL/min (ref >60)
Glucose, Bld: 107 mg/dL — ABNORMAL HIGH (ref 70–99)
Potassium: 4.2 mmol/L (ref 3.5–5.1)
Sodium: 129 mmol/L — ABNORMAL LOW (ref 135–145)

## 2020-05-05 LAB — CBC
HCT: 36.9 % — ABNORMAL LOW (ref 39.0–52.0)
Hemoglobin: 12.2 g/dL — ABNORMAL LOW (ref 13.0–17.0)
MCH: 27.7 pg (ref 26.0–34.0)
MCHC: 33.1 g/dL (ref 30.0–36.0)
MCV: 83.7 fL (ref 80.0–100.0)
Platelets: 310 10*3/uL (ref 150–400)
RBC: 4.41 MIL/uL (ref 4.22–5.81)
RDW: 13.7 % (ref 11.5–15.5)
WBC: 8.4 10*3/uL (ref 4.0–10.5)
nRBC: 0 % (ref 0.0–0.2)

## 2020-05-05 LAB — TROPONIN I (HIGH SENSITIVITY): Troponin I (High Sensitivity): 7 ng/L (ref <18)

## 2020-05-05 MED ORDER — OXYCODONE-ACETAMINOPHEN 5-325 MG PO TABS: 1.0000 | ORAL_TABLET | ORAL | 0 refills | Status: DC | PRN

## 2020-05-05 MED ORDER — LIDOCAINE HCL (PF) 1 % IJ SOLN: 5.0000 mL | Freq: Once | INTRAMUSCULAR | Status: AC

## 2020-05-05 MED ORDER — OXYCODONE-ACETAMINOPHEN 5-325 MG PO TABS
1.0000 | ORAL_TABLET | Freq: Once | ORAL | Status: AC
  Administered 2020-05-06: 1 via ORAL
  Filled 2020-05-05: qty 1

## 2020-05-05 MED ORDER — LIDOCAINE HCL (PF) 1 % IJ SOLN
INTRAMUSCULAR | Status: AC
  Administered 2020-05-06: 5 mL
  Filled 2020-05-05: qty 5

## 2020-05-05 NOTE — ED Triage Notes (Signed)
Pt comes EMS from home after tripping on curb and hitting chin. Chin lac. Pt also c/o right sided chest pain from fall as well. Hx of MI.

## 2020-05-05 NOTE — ED Notes (Signed)
Patient reports he miss stepped which caused him to trip on curb.  Patient fell hitting his chin, also reports having pain across chest (from just under right breast area to under left breast area).  Patient states other than the pain across his chest he feel much better.

## 2020-05-05 NOTE — ED Provider Notes (Signed)
[facility] Emergency Department Provider Note   ____________________________________________   First MD Initiated Contact with Patient 05/05/20 2332     (approximate)  I have reviewed the triage vital signs and the nursing notes.   HISTORY  Chief Complaint Fall and Laceration    HPI Brandon RheinRobert S [name] is a 64 y.o. male brought to the ED via EMS from home status post mechanical fall with chest pain and chin laceration.  Patient reports approximately 5:30 PM he tripped on the curb, striking his central chest and chin.  Presents with chin laceration.  Tetanus is up-to-date.  Denies LOC.  Denies vision changes, neck pain, headache, shortness of breath, abdominal pain, nausea, vomiting or dizziness.  Denies anticoagulant use.      Past Medical History:  Diagnosis Date  . Anemia    IDA  . Colon polyps   . COPD (chronic obstructive pulmonary disease) (HCC)   . Coronary artery disease   . Heart attack (HCC) 2005  . Hiatal hernia   . History of ETOH abuse   . Hyperlipidemia   . Hypertension   . Pleurisy   . Schizophrenia (HCC)   . Schizophrenia (HCC)   . Seizures (HCC)    grand mal in 2000 or 2001 recovery from alcoholism  . Suicide attempt [facility](HCC)     Patient Active Problem List   Diagnosis Date Noted  . Appendicitis with perforation 07/17/2018  . Acute cholecystitis 04/10/2018  . Chronic cholecystitis 04/08/2018  . Atherosclerosis of native arteries of extremity with intermittent claudication (HCC) 11/22/2017  . Essential hypertension 11/22/2017  . Hyperlipidemia 11/22/2017  . COPD (chronic obstructive pulmonary disease) (HCC) 11/22/2017  . CAD (coronary artery disease) 10/15/2017  . Other toe(s) amputation status 11/17/2014  . Schizoaffective disorder, bipolar type (HCC) 08/23/2008    Past Surgical History:  Procedure Laterality Date  . ABDOMINAL SURGERY     internal bleeding  . CHOLECYSTECTOMY N/A 05/04/2018   Procedure: LAPAROSCOPIC  CHOLECYSTECTOMY, converted to open;  Surgeon:  AmabileSakai, Isami, DO;  Location: [facility] ORS;  Service: General;  Laterality: N/A;  . COLONOSCOPY    . COLONOSCOPY WITH PROPOFOL N/A 05/09/2016   Procedure: COLONOSCOPY WITH PROPOFOL;  Surgeon: Scot Junobert T Elliott, MD;  [facility];  Service: Endoscopy;  Laterality: N/A;  . CORONARY ANGIOPLASTY WITH STENT PLACEMENT     1 vessel  . ESOPHAGOGASTRODUODENOSCOPY (EGD) WITH PROPOFOL N/A 05/09/2016   Procedure: ESOPHAGOGASTRODUODENOSCOPY (EGD) WITH PROPOFOL;  Surgeon: Scot Junobert T Elliott, MD;  [facility];  Service: Endoscopy;  Laterality: N/A;  . EYE SURGERY Right   . GLAUCOMA SURGERY    . LAPAROSCOPIC APPENDECTOMY N/A 07/17/2018   Procedure: APPENDECTOMY LAPAROSCOPIC;  Surgeon:  AmabileSakai, Isami, DO;  Location: [facility] ORS;  Service: General;  Laterality: N/A;  . LEFT HEART CATH Right 10/19/2017   Procedure: Left Heart Cath and Coronary Angiography;  Surgeon: Laurier NancyKhan, Shaukat A, MD;  [facility];  Service: Cardiovascular;  Laterality: Right;  . NOSE SURGERY    . 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 VASECTOMY      Prior to Admission medications   Medication Sig Start Date End Date Taking? Authorizing Provider  acetaminophen (TYLENOL) 325 MG tablet Take 650 mg by mouth every 4 (four) hours as needed.    [provider]  aluminum-magnesium hydroxide-simethicone (MAALOX) 200-200-20 MG/5ML SUSP Take 30 mLs by mouth 4 (four) times daily -  before meals and at bedtime. 09/02/18   Sharman CheekStafford, Phillip, MD  buPROPion (  WELLBUTRIN SR) 100 MG 12 hr tablet Take 100 mg by mouth daily.     [provider]  carvedilol (COREG) 25 MG tablet Take 25 mg by mouth 2 (two) times daily with a meal.    [provider]  cloZAPine (CLOZARIL) 100 MG tablet Take 200 mg by mouth at bedtime.     [provider]  docusate sodium (COLACE) 100 MG capsule Take 1 tablet once or twice daily as needed for constipation while taking narcotic  pain medicine Patient taking differently: Take 100 mg by mouth daily.  04/23/18   Loleta Rose, MD  dorzolamide-timolol (COSOPT) 22.3-6.8 MG/ML ophthalmic solution  03/26/20   [provider]  ezetimibe (ZETIA) 10 MG tablet Take 10 mg by mouth at bedtime.     [provider]  famotidine (PEPCID) 20 MG tablet Take 1 tablet (20 mg total) by mouth 2 (two) times daily. 06/15/18   Sharman Cheek, MD  fenofibrate 160 MG tablet Take 160 mg by mouth daily.    [provider]  fluticasone (FLONASE) 50 MCG/ACT nasal spray Place 2 sprays into both nostrils daily.     [provider]  furosemide (LASIX) 20 MG tablet Take 20 mg by mouth 2 (two) times daily.    [provider]  HYDROcodone-acetaminophen (NORCO/VICODIN) 5-325 MG tablet Take 1 tablet by mouth every 6 (six) hours as needed. 06/23/18   [provider]  hydrOXYzine (VISTARIL) 25 MG capsule Take 25 mg by mouth 2 (two) times daily as needed for anxiety.    [provider]  isosorbide mononitrate (IMDUR) 30 MG 24 hr tablet Take 30 mg by mouth 2 (two) times daily.     [provider]  ivermectin (STROMECTOL) 3 MG TABS tablet ivermectin 3 mg tablet    [provider]  latanoprost (XALATAN) 0.005 % ophthalmic solution Place 1 drop into both eyes at bedtime. 09/15/17   [provider]  nitroGLYCERIN (NITROSTAT) 0.4 MG SL tablet Place 0.4 mg under the tongue every 5 (five) minutes as needed for chest pain.     [provider]  ondansetron (ZOFRAN ODT) 4 MG disintegrating tablet Take 1 tablet (4 mg total) by mouth every 8 (eight) hours as needed for nausea or vomiting. 09/02/18   Sharman Cheek, MD  oxyCODONE-acetaminophen (PERCOCET/ROXICET) 5-325 MG tablet Take 1 tablet by mouth every 4 (four) hours as needed for severe pain. 05/05/20   Irean Hong, MD  pantoprazole (PROTONIX) 40 MG tablet Take 40 mg by mouth 2 (two) times daily.    [provider]   PARoxetine (PAXIL) 20 MG tablet Take 20 mg by mouth daily.    [provider]  polyethylene glycol powder (GLYCOLAX/MIRALAX) 17 GM/SCOOP powder Take as directed for colonic prep. 01/05/20   [provider]  senna (SENOKOT) 8.6 MG tablet Take 1-2 tablets by mouth daily as needed for constipation.    [provider]  simvastatin (ZOCOR) 40 MG tablet Take 40 mg by mouth at bedtime.     [provider]  SYMBICORT 80-4.5 MCG/ACT inhaler Inhale 2 puffs into the lungs 2 (two) times daily. 09/15/17   [provider]  tamsulosin (FLOMAX) 0.4 MG CAPS capsule Take 0.4 mg by mouth daily.     [provider]  timolol (TIMOPTIC) 0.5 % ophthalmic solution Place 1 drop into both eyes daily. 09/15/17   [provider]  traMADol (ULTRAM) 50 MG tablet Take 1 tablet (50 mg total) by mouth every 6 (six)  hours as needed. 09/02/18   Sharman Cheek, MD    Allergies Compazine [prochlorperazine], Ivp dye [iodinated diagnostic agents], Alcohol-sulfur [sulfur], Benadryl [diphenhydramine], Depakote [divalproex sodium], Dramamine [dimenhydrinate], and Plavix [clopidogrel]  Family History  Problem Relation Age of Onset  . Lung cancer Mother   . Hypertension Father   . Heart attack Father   . CAD Father   . Prostate cancer Neg Hx   . Bladder Cancer Neg Hx   . Kidney cancer Neg Hx     Social History Social History   Tobacco Use  . Smoking status: Former Smoker    Packs/day: 1.00    Types: Cigarettes    Quit date: 2012    Years since quitting: 9.7  . Smokeless tobacco: Former Clinical biochemist  . Vaping Use: Never used  Substance Use Topics  . Alcohol use: No    Comment: no alcohol since 2010  . Drug use: No    Review of Systems  Constitutional: No fever/chills Eyes: No visual changes. ENT: Positive for chin laceration.  No sore throat. Cardiovascular: Positive for chest pain. Respiratory: Denies shortness of breath. Gastrointestinal: No  abdominal pain.  No nausea, no vomiting.  No diarrhea.  No constipation. Genitourinary: Negative for dysuria. Musculoskeletal: Negative for back pain. Skin: Negative for rash. Neurological: Negative for headaches, focal weakness or numbness.   ____________________________________________   PHYSICAL EXAM:  VITAL SIGNS: ED Triage Vitals  Enc Vitals Group     BP 05/05/20 1759 (!) 154/102     Pulse Rate 05/05/20 1759 89     Resp 05/05/20 1759 18     Temp 05/05/20 1759 98.6 F (37 C)     Temp Source 05/05/20 1759 Oral     SpO2 05/05/20 1759 98 %     Weight 05/05/20 1757 220 lb (99.8 kg)     Height 05/05/20 1757 6' (1.829 m)     Head Circumference --      Peak Flow --      Pain Score 05/05/20 1757 8     Pain Loc --      Pain Edu? --      Excl. in GC? --     Constitutional: Alert and oriented. Well appearing and in no acute distress. Eyes: Conjunctivae are normal. PERRL. EOMI. Head: Atraumatic. Nose: Atraumatic. Mouth/Throat: Mucous membranes are moist.  No dental malocclusion.  Approximately 1 cm linear laceration to chin without active bleeding.   Neck: No stridor.  No cervical spine tenderness to palpation. Cardiovascular: Normal rate, regular rhythm. Grossly normal heart sounds.  Good peripheral circulation. Respiratory: Normal respiratory effort.  No retractions. Lungs CTAB.  No crepitus.  No splinting.  Anterior chest wall mildly tender to palpation.  No ecchymosis. Gastrointestinal: Soft and nontender to light or deep palpation. No distention. No abdominal bruits. No CVA tenderness. Musculoskeletal: No lower extremity tenderness nor edema.  No joint effusions. Neurologic:  Normal speech and language. No gross focal neurologic deficits are appreciated. No gait instability.  Ambulates with a cane. Skin:  Skin is warm, dry and intact. No rash noted. Psychiatric: Mood and affect are normal. Speech and behavior are normal.  ____________________________________________    LABS (all labs ordered are listed, but only abnormal results are displayed)  Labs Reviewed  BASIC METABOLIC PANEL - Abnormal; Notable for the following components:      Result Value   Sodium 129 (*)    Chloride 95 (*)    Glucose, Bld 107 (*)    BUN  7 (*)    Calcium 8.7 (*)    All other components within normal limits  CBC - Abnormal; Notable for the following components:   Hemoglobin 12.2 (*)    HCT 36.9 (*)    All other components within normal limits  TROPONIN I (HIGH SENSITIVITY)  TROPONIN I (HIGH SENSITIVITY)   ____________________________________________  EKG  ED ECG REPORT I, Pearlina Friedly J, the attending physician, personally viewed and interpreted this ECG.   Date: 05/05/2020  EKG Time: 1756  Rate: 96  Rhythm: normal EKG, normal sinus rhythm  Axis: Normal  Intervals:none  ST&T Change: Nonspecific  ____________________________________________  RADIOLOGY  ED MD interpretation: No acute cardiopulmonary process  Official radiology report(s): DG Chest 2 View  Result Date: 05/05/2020 CLINICAL DATA:  Right-sided chest pain. Patient tripped on a curb leading to fall. EXAM: CHEST - 2 VIEW COMPARISON:  Chest radiograph 06/04/2017 FINDINGS: The cardiomediastinal contours are normal. The lungs are clear. Pulmonary vasculature is normal. No consolidation, pleural effusion, or pneumothorax. No acute osseous abnormalities are seen. Incidental note of colonic interposition under the right hemidiaphragm. IMPRESSION: No acute chest findings. Electronically Signed   By: Narda Rutherford M.D.   On: 05/05/2020 18:38    ____________________________________________   PROCEDURES  Procedure(s) performed (including Critical Care):  Marland KitchenMarland KitchenLaceration Repair  Date/Time: 05/06/2020 12:00 AM Performed by: Irean Hong, MD Authorized by: Irean Hong, MD   Consent:    Consent obtained:  Verbal   Consent given by:  Patient   Risks discussed:  Infection, pain, poor cosmetic result and  poor wound healing Anesthesia (see MAR for exact dosages):    Anesthesia method:  Local infiltration   Local anesthetic:  Lidocaine 1% w/o epi Laceration details:    Location:  Face   Face location:  Chin   Length (cm):  1   Depth (mm):  3 Repair type:    Repair type:  Simple Pre-procedure details:    Preparation:  Patient was prepped and draped in usual sterile fashion Exploration:    Hemostasis achieved with:  Direct pressure   Wound exploration: entire depth of wound probed and visualized     Contaminated: no   Treatment:    Area cleansed with:  Saline   Amount of cleaning:  Standard   Irrigation solution:  Sterile saline   Irrigation method:  Tap   Visualized foreign bodies/material removed: no   Skin repair:    Repair method:  Sutures   Suture size:  5-0   Suture material:  Nylon   Suture technique:  Running   Number of sutures: continuous. Approximation:    Approximation:  Close Post-procedure details:    Dressing:  Open (no dressing)   Patient tolerance of procedure:  Tolerated well, no immediate complications     ____________________________________________   INITIAL IMPRESSION / ASSESSMENT AND PLAN / ED COURSE  As part of my medical decision making, I reviewed the following data within the electronic MEDICAL RECORD NUMBER Nursing notes reviewed and incorporated, Labs reviewed, EKG interpreted, Old chart reviewed, Radiograph reviewed, Notes from prior ED visits and [place] Controlled Substance Database        63 year old male presenting with anterior chest pain and chin laceration status post mechanical fall. Differential diagnosis includes, but is not limited to, ACS, aortic dissection, pulmonary embolism, cardiac tamponade, pneumothorax, pneumonia, pericarditis, myocarditis, GI-related causes including esophagitis/gastritis, and musculoskeletal chest wall pain.    Laboratory results demonstrate mild hyponatremia.  Normal troponin and EKG.  Patient tolerated sutures  well.  Will discharge home on Percocet to use as needed with follow-up with his PCP.  Strict return precautions given.  Patient verbalizes understanding and agrees with plan of care.      ____________________________________________   FINAL CLINICAL IMPRESSION(S) / ED DIAGNOSES  Final diagnoses:  Fall, initial encounter  Chin laceration, initial encounter  Blunt trauma to chest, initial encounter  Hyponatremia     ED Discharge Orders         Ordered    oxyCODONE-acetaminophen (PERCOCET/ROXICET) 5-325 MG tablet  Every 4 hours PRN        05/05/20 2354          *Please note:  DARY DILAURO was evaluated in Emergency Department on 05/05/2020 for the symptoms described in the history of present illness. He was evaluated in the context of the global COVID-19 pandemic, which necessitated consideration that the patient might be at risk for infection with the SARS-CoV-2 virus that causes COVID-19. Institutional protocols and algorithms that pertain to the evaluation of patients at risk for COVID-19 are in a state of rapid change based on information released by regulatory bodies including the CDC and federal and state organizations. These policies and algorithms were followed during the patient's care in the ED.  Some ED evaluations and interventions may be delayed as a result of limited staffing during and the pandemic.*   Note:  This document was prepared using Dragon voice recognition software and may include unintentional dictation errors.   Irean Hong, MD 05/06/20 [phone]

## 2020-05-05 NOTE — Discharge Instructions (Addendum)
1.  You may take Percocet as needed for pain. 2.  Suture removal in 5 days. 3.  Return to the ER for worsening symptoms, persistent vomiting, difficulty breathing or other concerns.

## 2020-05-06 DIAGNOSIS — S0181XA Laceration without foreign body of other part of head, initial encounter: Secondary | ICD-10-CM | POA: Diagnosis not present

## 2020-05-18 ENCOUNTER — Ambulatory Visit: Payer: Medicare Other | Admitting: Podiatry

## 2020-05-22 ENCOUNTER — Other Ambulatory Visit: Payer: Self-pay

## 2020-05-22 ENCOUNTER — Ambulatory Visit (INDEPENDENT_AMBULATORY_CARE_PROVIDER_SITE_OTHER): Payer: Medicare Other | Admitting: Podiatry

## 2020-05-22 DIAGNOSIS — L97522 Non-pressure chronic ulcer of other part of left foot with fat layer exposed: Secondary | ICD-10-CM

## 2020-05-22 DIAGNOSIS — L989 Disorder of the skin and subcutaneous tissue, unspecified: Secondary | ICD-10-CM | POA: Diagnosis not present

## 2020-05-22 NOTE — Progress Notes (Signed)
   Subjective: 64 y.o. male presenting to the office today for evaluation of an ulcer that had developed to the patient's left plantar forefoot.  Patient states that the ulcer is completely healed.  He does have some hyperkeratotic callus tissue noted around the area.  He presents for further treatment and evaluation   Past Medical History:  Diagnosis Date  . Anemia    IDA  . Colon polyps   . COPD (chronic obstructive pulmonary disease) (HCC)   . Coronary artery disease   . Heart attack (HCC) 2005  . Hiatal hernia   . History of ETOH abuse   . Hyperlipidemia   . Hypertension   . Pleurisy   . Schizophrenia (HCC)   . Schizophrenia (HCC)   . Seizures (HCC)    grand mal in 2000 or 2001 recovery from alcoholism  . Suicide attempt [facility])      Objective:  Physical Exam General: Alert and oriented x3 in no acute distress  Dermatology: Hyperkeratotic lesion(s) present on the left plantar forefoot. Pain on palpation with a central nucleated core noted. Skin is warm, dry and supple bilateral lower extremities. Negative for open lesions or macerations.  Vascular: Palpable pedal pulses bilaterally. No edema or erythema noted. Capillary refill within normal limits.  Neurological: Epicritic and protective threshold grossly intact bilaterally.   Musculoskeletal Exam: Pain on palpation at the keratotic lesion(s) noted. Range of motion within normal limits bilateral. Muscle strength 5/5 in all groups bilateral.  Assessment: 1.  Ulcer left plantar forefoot-healed 2.  Preulcerative callus left plantar forefoot   Plan of Care:  1. Patient evaluated 2. Excisional debridement of keratoic lesion(s) using a chisel blade was performed without incident.  3. Dressed area with light dressing. 4. Patient is to return to the clinic PRN.   Felecia Shelling, DPM [facility]  Dr. Felecia Shelling, DPM    [address]                                        [place], [place] [address]                 Office [phone]  Fax [phone]

## 2020-07-10 ENCOUNTER — Encounter: Payer: Medicare Other | Admitting: Podiatry

## 2020-07-11 ENCOUNTER — Telehealth: Payer: Self-pay

## 2020-07-11 NOTE — Telephone Encounter (Signed)
Patient called requesting a prescription for pain medication.  Please advise

## 2020-07-14 NOTE — Telephone Encounter (Signed)
Last saw this patient for calluses. Rx for pain meds not appropriate for this patient. - Dr. Logan Bores

## 2020-07-14 NOTE — Progress Notes (Signed)
This encounter was created in error - please disregard.

## 2020-07-19 NOTE — Telephone Encounter (Signed)
Patient notified of Dr. Logan Bores recommendation via voice mail  Instructed to try Ibuprofen and alternate with Tylenol until follow up appt with Dr. Logan Bores on 07/24/20

## 2020-07-24 ENCOUNTER — Encounter: Payer: Self-pay | Admitting: Podiatry

## 2020-07-24 ENCOUNTER — Ambulatory Visit (INDEPENDENT_AMBULATORY_CARE_PROVIDER_SITE_OTHER): Payer: Medicare Other | Admitting: Podiatry

## 2020-07-24 ENCOUNTER — Other Ambulatory Visit: Payer: Self-pay

## 2020-07-24 DIAGNOSIS — L97522 Non-pressure chronic ulcer of other part of left foot with fat layer exposed: Secondary | ICD-10-CM | POA: Diagnosis not present

## 2020-07-24 DIAGNOSIS — L989 Disorder of the skin and subcutaneous tissue, unspecified: Secondary | ICD-10-CM | POA: Diagnosis not present

## 2020-07-24 NOTE — Progress Notes (Signed)
   Subjective: 64 y.o. male presenting to the office today for recurrence of symptomatic callus to the left plantar forefoot.  Patient states that he continues to have some pain and sensitivity to the area.  He presents for further treatment evaluation   Past Medical History:  Diagnosis Date  . Anemia    IDA  . Colon polyps   . COPD (chronic obstructive pulmonary disease) (HCC)   . Coronary artery disease   . Heart attack (HCC) 2005  . Hiatal hernia   . History of ETOH abuse   . Hyperlipidemia   . Hypertension   . Pleurisy   . Schizophrenia (HCC)   . Schizophrenia (HCC)   . Seizures (HCC)    grand mal in 2000 or 2001 recovery from alcoholism  . Suicide attempt [facility])      Objective:  Physical Exam General: Alert and oriented x3 in no acute distress  Dermatology: Hyperkeratotic lesion(s) present on the plantar aspect of the left forefoot. Pain on palpation with a central nucleated core noted. Skin is warm, dry and supple bilateral lower extremities. Negative for open lesions or macerations.  Vascular: Palpable pedal pulses bilaterally. No edema or erythema noted. Capillary refill within normal limits.  Neurological: Epicritic and protective threshold grossly intact bilaterally.   Musculoskeletal Exam: Pain on palpation at the keratotic lesion(s) noted. Range of motion within normal limits bilateral. Muscle strength 5/5 in all groups bilateral.  Assessment: 1.  Symptomatic preulcerative callus left forefoot   Plan of Care:  1. Patient evaluated 2. Excisional debridement of keratoic lesion(s) using a chisel blade was performed without incident.  3. Dressed area with light dressing. 4. Salinocaine applied.  5.  Offloading felt metatarsal pads applied to the insoles of the shoes  6.  Patient is to return to the clinic PRN.   Felecia Shelling, DPM [facility]  Dr. Felecia Shelling, DPM    [address]                                         [place], [place] [address]                Office [phone]  Fax [phone]

## 2020-07-26 ENCOUNTER — Encounter (INDEPENDENT_AMBULATORY_CARE_PROVIDER_SITE_OTHER): Payer: Medicare Other

## 2020-07-26 ENCOUNTER — Ambulatory Visit (INDEPENDENT_AMBULATORY_CARE_PROVIDER_SITE_OTHER): Payer: Medicare Other | Admitting: Vascular Surgery

## 2020-08-13 ENCOUNTER — Other Ambulatory Visit: Payer: Medicare Other

## 2020-08-13 ENCOUNTER — Other Ambulatory Visit: Payer: Self-pay

## 2020-08-13 DIAGNOSIS — Z20822 Contact with and (suspected) exposure to covid-19: Secondary | ICD-10-CM

## 2020-08-14 LAB — SARS-COV-2, NAA 2 DAY TAT

## 2020-08-14 LAB — NOVEL CORONAVIRUS, NAA: SARS-CoV-2, NAA: NOT DETECTED

## 2020-08-15 ENCOUNTER — Telehealth: Payer: Self-pay | Admitting: Internal Medicine

## 2020-08-15 NOTE — Telephone Encounter (Signed)
Negative COVID results given. Patient results "NOT Detected." Caller expressed understanding. ° °

## 2020-11-09 DIAGNOSIS — R569 Unspecified convulsions: Secondary | ICD-10-CM | POA: Diagnosis not present

## 2020-11-09 DIAGNOSIS — N182 Chronic kidney disease, stage 2 (mild): Secondary | ICD-10-CM | POA: Diagnosis not present

## 2020-11-09 DIAGNOSIS — M79673 Pain in unspecified foot: Secondary | ICD-10-CM | POA: Diagnosis not present

## 2020-11-09 DIAGNOSIS — R5383 Other fatigue: Secondary | ICD-10-CM | POA: Diagnosis not present

## 2020-11-09 DIAGNOSIS — I70219 Atherosclerosis of native arteries of extremities with intermittent claudication, unspecified extremity: Secondary | ICD-10-CM | POA: Diagnosis not present

## 2020-11-09 DIAGNOSIS — E785 Hyperlipidemia, unspecified: Secondary | ICD-10-CM | POA: Diagnosis not present

## 2020-11-09 DIAGNOSIS — J449 Chronic obstructive pulmonary disease, unspecified: Secondary | ICD-10-CM | POA: Diagnosis not present

## 2020-11-12 DIAGNOSIS — H401133 Primary open-angle glaucoma, bilateral, severe stage: Secondary | ICD-10-CM | POA: Diagnosis not present

## 2020-11-16 DIAGNOSIS — I10 Essential (primary) hypertension: Secondary | ICD-10-CM | POA: Diagnosis not present

## 2020-11-21 DIAGNOSIS — J449 Chronic obstructive pulmonary disease, unspecified: Secondary | ICD-10-CM | POA: Diagnosis not present

## 2020-11-21 DIAGNOSIS — I70219 Atherosclerosis of native arteries of extremities with intermittent claudication, unspecified extremity: Secondary | ICD-10-CM | POA: Diagnosis not present

## 2020-11-21 DIAGNOSIS — R569 Unspecified convulsions: Secondary | ICD-10-CM | POA: Diagnosis not present

## 2020-11-21 DIAGNOSIS — I10 Essential (primary) hypertension: Secondary | ICD-10-CM | POA: Diagnosis not present

## 2020-11-21 DIAGNOSIS — M79673 Pain in unspecified foot: Secondary | ICD-10-CM | POA: Diagnosis not present

## 2020-11-21 DIAGNOSIS — Z9861 Coronary angioplasty status: Secondary | ICD-10-CM | POA: Diagnosis not present

## 2020-11-21 DIAGNOSIS — R5383 Other fatigue: Secondary | ICD-10-CM | POA: Diagnosis not present

## 2020-11-21 DIAGNOSIS — D509 Iron deficiency anemia, unspecified: Secondary | ICD-10-CM | POA: Diagnosis not present

## 2020-11-21 DIAGNOSIS — E785 Hyperlipidemia, unspecified: Secondary | ICD-10-CM | POA: Diagnosis not present

## 2020-11-21 DIAGNOSIS — Z95820 Peripheral vascular angioplasty status with implants and grafts: Secondary | ICD-10-CM | POA: Diagnosis not present

## 2020-11-21 DIAGNOSIS — I2510 Atherosclerotic heart disease of native coronary artery without angina pectoris: Secondary | ICD-10-CM | POA: Diagnosis not present

## 2020-11-21 DIAGNOSIS — N182 Chronic kidney disease, stage 2 (mild): Secondary | ICD-10-CM | POA: Diagnosis not present

## 2020-11-21 DIAGNOSIS — I5023 Acute on chronic systolic (congestive) heart failure: Secondary | ICD-10-CM | POA: Diagnosis not present

## 2020-11-27 ENCOUNTER — Other Ambulatory Visit: Payer: Self-pay

## 2020-11-27 ENCOUNTER — Emergency Department: Payer: Medicare Other

## 2020-11-27 ENCOUNTER — Inpatient Hospital Stay
Admission: EM | Admit: 2020-11-27 | Discharge: 2020-12-03 | DRG: 641 | Disposition: A | Discharge status: Skilled Nursing Facility | Payer: Medicare Other | Attending: Internal Medicine | Admitting: Internal Medicine

## 2020-11-27 DIAGNOSIS — N400 Enlarged prostate without lower urinary tract symptoms: Secondary | ICD-10-CM | POA: Diagnosis present

## 2020-11-27 DIAGNOSIS — E860 Dehydration: Secondary | ICD-10-CM | POA: Diagnosis not present

## 2020-11-27 DIAGNOSIS — Z20822 Contact with and (suspected) exposure to covid-19: Secondary | ICD-10-CM | POA: Diagnosis present

## 2020-11-27 DIAGNOSIS — D649 Anemia, unspecified: Secondary | ICD-10-CM | POA: Diagnosis not present

## 2020-11-27 DIAGNOSIS — R7303 Prediabetes: Secondary | ICD-10-CM | POA: Diagnosis present

## 2020-11-27 DIAGNOSIS — I959 Hypotension, unspecified: Secondary | ICD-10-CM | POA: Diagnosis not present

## 2020-11-27 DIAGNOSIS — Z9151 Personal history of suicidal behavior: Secondary | ICD-10-CM

## 2020-11-27 DIAGNOSIS — Z736 Limitation of activities due to disability: Secondary | ICD-10-CM | POA: Diagnosis not present

## 2020-11-27 DIAGNOSIS — Z8249 Family history of ischemic heart disease and other diseases of the circulatory system: Secondary | ICD-10-CM

## 2020-11-27 DIAGNOSIS — Z9852 Vasectomy status: Secondary | ICD-10-CM | POA: Diagnosis not present

## 2020-11-27 DIAGNOSIS — D509 Iron deficiency anemia, unspecified: Secondary | ICD-10-CM | POA: Diagnosis not present

## 2020-11-27 DIAGNOSIS — Z87891 Personal history of nicotine dependence: Secondary | ICD-10-CM | POA: Diagnosis not present

## 2020-11-27 DIAGNOSIS — E871 Hypo-osmolality and hyponatremia: Secondary | ICD-10-CM | POA: Diagnosis not present

## 2020-11-27 DIAGNOSIS — D631 Anemia in chronic kidney disease: Secondary | ICD-10-CM | POA: Diagnosis not present

## 2020-11-27 DIAGNOSIS — I160 Hypertensive urgency: Secondary | ICD-10-CM | POA: Diagnosis not present

## 2020-11-27 DIAGNOSIS — Z79899 Other long term (current) drug therapy: Secondary | ICD-10-CM

## 2020-11-27 DIAGNOSIS — K2100 Gastro-esophageal reflux disease with esophagitis, without bleeding: Secondary | ICD-10-CM | POA: Diagnosis not present

## 2020-11-27 DIAGNOSIS — E861 Hypovolemia: Secondary | ICD-10-CM | POA: Diagnosis not present

## 2020-11-27 DIAGNOSIS — R498 Other voice and resonance disorders: Secondary | ICD-10-CM | POA: Diagnosis not present

## 2020-11-27 DIAGNOSIS — I480 Paroxysmal atrial fibrillation: Secondary | ICD-10-CM | POA: Diagnosis present

## 2020-11-27 DIAGNOSIS — E878 Other disorders of electrolyte and fluid balance, not elsewhere classified: Secondary | ICD-10-CM | POA: Diagnosis not present

## 2020-11-27 DIAGNOSIS — J449 Chronic obstructive pulmonary disease, unspecified: Secondary | ICD-10-CM | POA: Diagnosis not present

## 2020-11-27 DIAGNOSIS — F32A Depression, unspecified: Secondary | ICD-10-CM | POA: Diagnosis not present

## 2020-11-27 DIAGNOSIS — Z91041 Radiographic dye allergy status: Secondary | ICD-10-CM

## 2020-11-27 DIAGNOSIS — E876 Hypokalemia: Secondary | ICD-10-CM | POA: Diagnosis not present

## 2020-11-27 DIAGNOSIS — I252 Old myocardial infarction: Secondary | ICD-10-CM

## 2020-11-27 DIAGNOSIS — M170 Bilateral primary osteoarthritis of knee: Secondary | ICD-10-CM | POA: Diagnosis present

## 2020-11-27 DIAGNOSIS — Z23 Encounter for immunization: Secondary | ICD-10-CM | POA: Diagnosis present

## 2020-11-27 DIAGNOSIS — Z7951 Long term (current) use of inhaled steroids: Secondary | ICD-10-CM | POA: Diagnosis not present

## 2020-11-27 DIAGNOSIS — R531 Weakness: Secondary | ICD-10-CM | POA: Diagnosis not present

## 2020-11-27 DIAGNOSIS — E785 Hyperlipidemia, unspecified: Secondary | ICD-10-CM | POA: Diagnosis not present

## 2020-11-27 DIAGNOSIS — I4891 Unspecified atrial fibrillation: Secondary | ICD-10-CM | POA: Diagnosis not present

## 2020-11-27 DIAGNOSIS — Z888 Allergy status to other drugs, medicaments and biological substances status: Secondary | ICD-10-CM

## 2020-11-27 DIAGNOSIS — M13869 Other specified arthritis, unspecified knee: Secondary | ICD-10-CM | POA: Diagnosis not present

## 2020-11-27 DIAGNOSIS — I110 Hypertensive heart disease with heart failure: Secondary | ICD-10-CM | POA: Diagnosis present

## 2020-11-27 DIAGNOSIS — I509 Heart failure, unspecified: Secondary | ICD-10-CM | POA: Diagnosis not present

## 2020-11-27 DIAGNOSIS — I10 Essential (primary) hypertension: Secondary | ICD-10-CM

## 2020-11-27 DIAGNOSIS — R0902 Hypoxemia: Secondary | ICD-10-CM

## 2020-11-27 DIAGNOSIS — R0602 Shortness of breath: Secondary | ICD-10-CM | POA: Diagnosis not present

## 2020-11-27 DIAGNOSIS — F209 Schizophrenia, unspecified: Secondary | ICD-10-CM

## 2020-11-27 DIAGNOSIS — Z955 Presence of coronary angioplasty implant and graft: Secondary | ICD-10-CM

## 2020-11-27 DIAGNOSIS — I2510 Atherosclerotic heart disease of native coronary artery without angina pectoris: Secondary | ICD-10-CM | POA: Diagnosis present

## 2020-11-27 DIAGNOSIS — M6281 Muscle weakness (generalized): Secondary | ICD-10-CM | POA: Diagnosis not present

## 2020-11-27 DIAGNOSIS — R2681 Unsteadiness on feet: Secondary | ICD-10-CM | POA: Diagnosis not present

## 2020-11-27 DIAGNOSIS — I5032 Chronic diastolic (congestive) heart failure: Secondary | ICD-10-CM | POA: Diagnosis not present

## 2020-11-27 DIAGNOSIS — Z89422 Acquired absence of other left toe(s): Secondary | ICD-10-CM

## 2020-11-27 DIAGNOSIS — E119 Type 2 diabetes mellitus without complications: Secondary | ICD-10-CM | POA: Diagnosis not present

## 2020-11-27 DIAGNOSIS — I5030 Unspecified diastolic (congestive) heart failure: Secondary | ICD-10-CM | POA: Diagnosis not present

## 2020-11-27 LAB — COMPREHENSIVE METABOLIC PANEL
ALT: 30 U/L (ref 0–44)
AST: 27 U/L (ref 15–41)
Albumin: 3.6 g/dL (ref 3.5–5.0)
Alkaline Phosphatase: 68 U/L (ref 38–126)
Anion gap: 8 (ref 5–15)
BUN: 9 mg/dL (ref 8–23)
CO2: 25 mmol/L (ref 22–32)
Calcium: 8.7 mg/dL — ABNORMAL LOW (ref 8.9–10.3)
Chloride: 90 mmol/L — ABNORMAL LOW (ref 98–111)
Creatinine, Ser: 0.81 mg/dL (ref 0.61–1.24)
GFR, Estimated: >60 mL/min (ref >60)
Glucose, Bld: 109 mg/dL — ABNORMAL HIGH (ref 70–99)
Potassium: 3.9 mmol/L (ref 3.5–5.1)
Sodium: 123 mmol/L — ABNORMAL LOW (ref 135–145)
Total Bilirubin: 0.6 mg/dL (ref 0.3–1.2)
Total Protein: 6.2 g/dL — ABNORMAL LOW (ref 6.5–8.1)

## 2020-11-27 LAB — URINALYSIS, COMPLETE (UACMP) WITH MICROSCOPIC
Bacteria, UA: NONE SEEN
Bilirubin Urine: NEGATIVE
Glucose, UA: NEGATIVE mg/dL
Hgb urine dipstick: NEGATIVE
Ketones, ur: NEGATIVE mg/dL
Leukocytes,Ua: NEGATIVE
Nitrite: NEGATIVE
Protein, ur: NEGATIVE mg/dL
Specific Gravity, Urine: 1.01 (ref 1.005–1.030)
Squamous Epithelial / HPF: NONE SEEN (ref 0–5)
pH: 5 (ref 5.0–8.0)

## 2020-11-27 LAB — CBC
HCT: 36.9 % — ABNORMAL LOW (ref 39.0–52.0)
Hemoglobin: 12.7 g/dL — ABNORMAL LOW (ref 13.0–17.0)
MCH: 29.1 pg (ref 26.0–34.0)
MCHC: 34.4 g/dL (ref 30.0–36.0)
MCV: 84.4 fL (ref 80.0–100.0)
Platelets: 320 10*3/uL (ref 150–400)
RBC: 4.37 MIL/uL (ref 4.22–5.81)
RDW: 13.2 % (ref 11.5–15.5)
WBC: 8.1 10*3/uL (ref 4.0–10.5)
nRBC: 0 % (ref 0.0–0.2)

## 2020-11-27 LAB — RESP PANEL BY RT-PCR (FLU A&B, COVID) ARPGX2
Influenza A by PCR: NEGATIVE
Influenza B by PCR: NEGATIVE
SARS Coronavirus 2 by RT PCR: NEGATIVE

## 2020-11-27 LAB — CK: Total CK: 272 U/L (ref 49–397)

## 2020-11-27 MED ORDER — SODIUM CHLORIDE 0.9 % IV BOLUS
1000.0000 mL | Freq: Once | INTRAVENOUS | Status: AC
  Administered 2020-11-27: 1000 mL via INTRAVENOUS

## 2020-11-27 MED ORDER — HYDROXYZINE PAMOATE 25 MG PO CAPS
25.0000 mg | ORAL_CAPSULE | Freq: Two times a day (BID) | ORAL | Status: DC | PRN
  Filled 2020-11-27: qty 1

## 2020-11-27 MED ORDER — TAMSULOSIN HCL 0.4 MG PO CAPS
0.4000 mg | ORAL_CAPSULE | Freq: Every day | ORAL | Status: DC
  Administered 2020-11-28 – 2020-12-03 (×6): 0.4 mg via ORAL
  Filled 2020-11-27 (×6): qty 1

## 2020-11-27 MED ORDER — PAROXETINE HCL 20 MG PO TABS: 20.0000 mg | ORAL_TABLET | Freq: Every day | ORAL | Status: DC

## 2020-11-27 MED ORDER — ACETAMINOPHEN 325 MG PO TABS: 650.0000 mg | ORAL_TABLET | Freq: Four times a day (QID) | ORAL | Status: DC | PRN

## 2020-11-27 MED ORDER — EZETIMIBE 10 MG PO TABS
10.0000 mg | ORAL_TABLET | Freq: Every day | ORAL | Status: DC
  Administered 2020-11-28 – 2020-12-02 (×5): 10 mg via ORAL
  Filled 2020-11-27 (×6): qty 1

## 2020-11-27 MED ORDER — ALBUTEROL SULFATE (2.5 MG/3ML) 0.083% IN NEBU
2.5000 mg | INHALATION_SOLUTION | RESPIRATORY_TRACT | Status: DC | PRN
  Administered 2020-12-02 – 2020-12-03 (×2): 2.5 mg via RESPIRATORY_TRACT
  Filled 2020-11-27 (×3): qty 3

## 2020-11-27 MED ORDER — CARVEDILOL 25 MG PO TABS
25.0000 mg | ORAL_TABLET | Freq: Two times a day (BID) | ORAL | Status: DC
  Administered 2020-11-28 – 2020-12-01 (×7): 25 mg via ORAL
  Filled 2020-11-27 (×7): qty 1

## 2020-11-27 MED ORDER — ALUM & MAG HYDROXIDE-SIMETH 200-200-20 MG/5ML PO SUSP
30.0000 mL | Freq: Three times a day (TID) | ORAL | Status: DC
  Administered 2020-11-28 – 2020-12-03 (×18): 30 mL via ORAL
  Filled 2020-11-27 (×18): qty 30

## 2020-11-27 MED ORDER — DOCUSATE SODIUM 100 MG PO CAPS
100.0000 mg | ORAL_CAPSULE | Freq: Every day | ORAL | Status: DC
  Administered 2020-11-28 – 2020-12-03 (×6): 100 mg via ORAL
  Filled 2020-11-27 (×6): qty 1

## 2020-11-27 MED ORDER — HYDROCODONE-ACETAMINOPHEN 5-325 MG PO TABS: 1.0000 | ORAL_TABLET | Freq: Four times a day (QID) | ORAL | Status: DC | PRN

## 2020-11-27 MED ORDER — FLUTICASONE FUROATE-VILANTEROL 100-25 MCG/INH IN AEPB
1.0000 | INHALATION_SPRAY | Freq: Every day | RESPIRATORY_TRACT | Status: DC
  Administered 2020-11-28 – 2020-12-03 (×6): 1 via RESPIRATORY_TRACT
  Filled 2020-11-27: qty 28

## 2020-11-27 MED ORDER — LATANOPROST 0.005 % OP SOLN
1.0000 [drp] | Freq: Every day | OPHTHALMIC | Status: DC
  Administered 2020-11-28 – 2020-12-02 (×5): 1 [drp] via OPHTHALMIC
  Filled 2020-11-27: qty 2.5

## 2020-11-27 MED ORDER — DORZOLAMIDE HCL-TIMOLOL MAL 2-0.5 % OP SOLN
1.0000 [drp] | Freq: Two times a day (BID) | OPHTHALMIC | Status: DC
  Administered 2020-11-28 – 2020-12-03 (×11): 1 [drp] via OPHTHALMIC
  Filled 2020-11-27: qty 10

## 2020-11-27 MED ORDER — SIMVASTATIN 20 MG PO TABS
40.0000 mg | ORAL_TABLET | Freq: Every day | ORAL | Status: DC
  Administered 2020-11-28 – 2020-12-02 (×6): 40 mg via ORAL
  Filled 2020-11-27 (×2): qty 2
  Filled 2020-11-27: qty 4
  Filled 2020-11-27 (×3): qty 2

## 2020-11-27 MED ORDER — BUPROPION HCL ER (SR) 100 MG PO TB12
100.0000 mg | ORAL_TABLET | Freq: Every day | ORAL | Status: DC
  Administered 2020-11-28 – 2020-12-03 (×6): 100 mg via ORAL
  Filled 2020-11-27 (×6): qty 1

## 2020-11-27 MED ORDER — SODIUM CHLORIDE 0.9 % IV SOLN: Freq: Once | INTRAVENOUS | Status: AC

## 2020-11-27 MED ORDER — FAMOTIDINE 20 MG PO TABS
20.0000 mg | ORAL_TABLET | Freq: Two times a day (BID) | ORAL | Status: DC
  Administered 2020-11-28 – 2020-12-03 (×11): 20 mg via ORAL
  Filled 2020-11-27 (×11): qty 1

## 2020-11-27 MED ORDER — SENNA 8.6 MG PO TABS: 1.0000 | ORAL_TABLET | Freq: Every day | ORAL | Status: DC | PRN

## 2020-11-27 MED ORDER — HYDRALAZINE HCL 20 MG/ML IJ SOLN
10.0000 mg | Freq: Four times a day (QID) | INTRAMUSCULAR | Status: DC | PRN
  Administered 2020-11-28 – 2020-11-30 (×2): 10 mg via INTRAVENOUS
  Filled 2020-11-27 (×2): qty 1

## 2020-11-27 MED ORDER — NITROGLYCERIN 0.4 MG SL SUBL: 0.4000 mg | SUBLINGUAL_TABLET | SUBLINGUAL | Status: DC | PRN

## 2020-11-27 MED ORDER — FENOFIBRATE 160 MG PO TABS
160.0000 mg | ORAL_TABLET | Freq: Every day | ORAL | Status: DC
  Administered 2020-11-28 – 2020-12-03 (×6): 160 mg via ORAL
  Filled 2020-11-27 (×6): qty 1

## 2020-11-27 MED ORDER — ENOXAPARIN SODIUM 40 MG/0.4ML ~~LOC~~ SOLN
40.0000 mg | SUBCUTANEOUS | Status: DC
  Administered 2020-11-28 – 2020-12-03 (×6): 40 mg via SUBCUTANEOUS
  Filled 2020-11-27 (×6): qty 0.4

## 2020-11-27 MED ORDER — FLUTICASONE PROPIONATE 50 MCG/ACT NA SUSP
2.0000 | Freq: Every day | NASAL | Status: DC
  Administered 2020-11-28 – 2020-12-03 (×6): 2 via NASAL
  Filled 2020-11-27: qty 16

## 2020-11-27 MED ORDER — CLOZAPINE 100 MG PO TABS
200.0000 mg | ORAL_TABLET | Freq: Every day | ORAL | Status: DC
  Administered 2020-11-28: 150 mg via ORAL
  Administered 2020-11-29 – 2020-12-02 (×4): 200 mg via ORAL
  Filled 2020-11-27: qty 2
  Filled 2020-11-27: qty 8
  Filled 2020-11-27 (×5): qty 2

## 2020-11-27 MED ORDER — SODIUM CHLORIDE 0.9 % IV SOLN: Freq: Once | INTRAVENOUS | Status: DC

## 2020-11-27 MED ORDER — ISOSORBIDE MONONITRATE ER 30 MG PO TB24
30.0000 mg | ORAL_TABLET | Freq: Two times a day (BID) | ORAL | Status: DC
  Administered 2020-11-28 – 2020-12-03 (×11): 30 mg via ORAL
  Filled 2020-11-27 (×11): qty 1

## 2020-11-27 NOTE — ED Notes (Signed)
Lab called to add on CK to existing blood work.

## 2020-11-27 NOTE — H&P (Incomplete)
Chief Complaint: Patient presented on account of generalized weakness and muscle cramps of a few days duration.  Brandon [name] is an 65 y.o. male with medical history significant for schizophrenia, hypertension, hyperlipidemia, COPD not on oxygen therapy, coronary disease status post stent and hypertension.  History of alcohol abuse in the past but admits no recent use.  He presented to the emergency room with complaints of feeling lightheaded and generally weak.  He also complained of muscle cramps.  He has had poor urinary output.  Admits to history of frequency and urgency of micturition.  He does have poor oral intake over the past 24 hours due to his generalized weakness.  At baseline, patient is ADL, IADL independent.  He denies any abdominal pain, nausea or vomiting.  Presentation, work-up in the ED revealed hyponatremia with serum sodium of 123, hypochloremia.   Past Medical History:  Diagnosis Date  . Anemia    IDA  . Colon polyps   . COPD (chronic obstructive pulmonary disease) (HCC)   . Coronary artery disease   . Heart attack (HCC) 2005  . Hiatal hernia   . History of ETOH abuse   . Hyperlipidemia   . Hypertension   . Pleurisy   . Schizophrenia (HCC)   . Schizophrenia (HCC)   . Seizures (HCC)    grand mal in 2000 or 2001 recovery from alcoholism  . Suicide attempt [facility])     Past Surgical History:  Procedure Laterality Date  . ABDOMINAL SURGERY     internal bleeding  . CHOLECYSTECTOMY N/A 05/04/2018   Procedure: LAPAROSCOPIC CHOLECYSTECTOMY, converted to open;  Surgeon: Sung Amabile, DO;  Location: [facility] ORS;  Service: General;  Laterality: N/A;  . COLONOSCOPY    . COLONOSCOPY WITH PROPOFOL N/A 05/09/2016   Procedure: COLONOSCOPY WITH PROPOFOL;  Surgeon: Scot Jun, MD;  Location: [facility] ENDOSCOPY;  Service: Endoscopy;  Laterality: N/A;  . CORONARY ANGIOPLASTY WITH STENT PLACEMENT     1 vessel  . ESOPHAGOGASTRODUODENOSCOPY (EGD) WITH PROPOFOL N/A 05/09/2016    Procedure: ESOPHAGOGASTRODUODENOSCOPY (EGD) WITH PROPOFOL;  Surgeon: Scot Jun, MD;  Location: [facility] ENDOSCOPY;  Service: Endoscopy;  Laterality: N/A;  . EYE SURGERY Right   . GLAUCOMA SURGERY    . LAPAROSCOPIC APPENDECTOMY N/A 07/17/2018   Procedure: APPENDECTOMY LAPAROSCOPIC;  Surgeon: Sung Amabile, DO;  Location: [facility] ORS;  Service: General;  Laterality: N/A;  . LEFT HEART CATH Right 10/19/2017   Procedure: Left Heart Cath and Coronary Angiography;  Surgeon: Laurier Nancy, MD;  Location: [facility] INVASIVE CV LAB;  Service: Cardiovascular;  Laterality: Right;  . NOSE SURGERY    . 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 VASECTOMY      Family History  Problem Relation Age of Onset  . Lung cancer Mother   . Hypertension Father   . Heart attack Father   . CAD Father   . Prostate cancer Neg Hx   . Bladder Cancer Neg Hx   . Kidney cancer Neg Hx    Social History:  reports that he quit smoking about 10 years ago. His smoking use included cigarettes. He smoked 1.00 pack per day. He has quit using smokeless tobacco. He reports that he does not drink alcohol and does not use drugs.  Allergies:  Allergies  Allergen Reactions  . Compazine [Prochlorperazine] Other (See Comments)    Dystonic rxn - convulsions. Near fatal reaction.   Ardine Bjork [Iodinated Diagnostic Agents] Shortness Of Breath    Pt  states SOB after last IV contrast injection  . Alcohol-Sulfur [Elemental Sulfur] Other (See Comments)    History of alcoholism  . Benadryl [Diphenhydramine] Other (See Comments)    "stuffy", nasal congestion  . Depakote [Divalproex Sodium] Other (See Comments)    Cause elevated ammonia  . Dramamine [Dimenhydrinate] Swelling  . Plavix [Clopidogrel] Other (See Comments)    Rectal bleeding. "Perforated my intestines."  . Rosuvastatin     Other reaction(s): Other (See Comments)    (Not in a hospital admission)   Results for orders placed or performed during the hospital encounter of 11/27/20  (from the past 48 hour(s))  CBC     Status: Abnormal   Collection Time: 11/27/20  8:28 PM  Result Value Ref Range   WBC 8.1 4.0 - 10.5 K/uL   RBC 4.37 4.22 - 5.81 MIL/uL   Hemoglobin 12.7 (L) 13.0 - 17.0 g/dL   HCT 17.5 (L) 10.2 - 58.5 %   MCV 84.4 80.0 - 100.0 fL   MCH 29.1 26.0 - 34.0 pg   MCHC 34.4 30.0 - 36.0 g/dL   RDW 27.7 82.4 - 23.5 %   Platelets 320 150 - 400 K/uL   nRBC 0.0 0.0 - 0.2 %    Comment: Performed at [facility], [address] [place], [place] [address]  Comprehensive metabolic panel     Status: Abnormal   Collection Time: 11/27/20  8:28 PM  Result Value Ref Range   Sodium 123 (L) 135 - 145 mmol/L   Potassium 3.9 3.5 - 5.1 mmol/L   Chloride 90 (L) 98 - 111 mmol/L   CO2 25 22 - 32 mmol/L   Glucose, Bld 109 (H) 70 - 99 mg/dL    Comment: Glucose reference range applies only to samples taken after fasting for at least 8 hours.   BUN 9 8 - 23 mg/dL   Creatinine, Ser 3.15 0.61 - 1.24 mg/dL   Calcium 8.7 (L) 8.9 - 10.3 mg/dL   Total Protein 6.2 (L) 6.5 - 8.1 g/dL   Albumin 3.6 3.5 - 5.0 g/dL   AST 27 15 - 41 U/L   ALT 30 0 - 44 U/L   Alkaline Phosphatase 68 38 - 126 U/L   Total Bilirubin 0.6 0.3 - 1.2 mg/dL   GFR, Estimated >40 >08 mL/min    Comment: (NOTE) Calculated using the CKD-EPI Creatinine Equation (2021)    Anion gap 8 5 - 15    Comment: Performed at [facility], [address] [place], [place] [address]  CK     Status: None   Collection Time: 11/27/20  8:28 PM  Result Value Ref Range   Total CK 272 49 - 397 U/L    Comment: Performed at [facility], [address] [place], [place] [address]   DG Chest Portable 1 View  Result Date: 11/27/2020 CLINICAL DATA:  Weakness EXAM: PORTABLE CHEST 1 VIEW COMPARISON:  05/05/2020 FINDINGS: No focal opacity or pleural effusion. Stable cardiomediastinal silhouette with aortic atherosclerosis. No pneumothorax. IMPRESSION: No active disease. Electronically Signed   By: Jasmine Pang M.D.   On: 11/27/2020 20:57    Review of Systems  Constitutional: Positive for fever. Negative for diaphoresis and fatigue.  HENT: Negative.   Eyes: Negative.   Respiratory: Positive for cough.   Gastrointestinal: Negative.   Endocrine: Negative.   Genitourinary: Negative.   Musculoskeletal: Negative.   Neurological: Negative.   Hematological: Negative.   Psychiatric/Behavioral: Negative.     Blood  pressure (!) 159/100, pulse 87, temperature 97.7 F (36.5 C), temperature source Oral, resp. rate (!) 25, height 6' (1.829 m), weight 90.7 kg, SpO2 96 %. Physical Exam Constitutional:      Appearance: Normal appearance.  HENT:     Head: Normocephalic.  Cardiovascular:     Rate and Rhythm: Normal rate and regular rhythm.     Pulses: Normal pulses.  Pulmonary:     Effort: Pulmonary effort is normal.  Abdominal:     General: Abdomen is flat. Bowel sounds are normal.     Palpations: Abdomen is soft.  Musculoskeletal:        General: Tenderness present.     Cervical back: Normal range of motion and neck supple.  Skin:    General: Skin is warm and dry.  Neurological:     General: No focal deficit present.     Mental Status: He is alert and oriented to person, place, and time.  Psychiatric:        Mood and Affect: Mood normal.        Behavior: Behavior normal.      Assessment/Plan  Hyponatremia  Acute Dehydrration  Schizophrenia  CAD  Afib  Lilia Pro, MD 11/27/2020, 11:32 PM

## 2020-11-27 NOTE — ED Notes (Signed)
Lab called for 2nd time due to CK not running. Per lab- they will run it now.

## 2020-11-27 NOTE — ED Notes (Signed)
Bladder scan showed . Roxan Hockey, MD aware. Patient given urinal for sample when able.

## 2020-11-27 NOTE — ED Triage Notes (Signed)
Pt states for the past few days he has been feeling more tired recently and having muscle cramps. Pt states he is also having trouble urinating, pt states he has urge but isnt able to actually urinate. Pt also states he was concerned over his bp being 101/70 at home,.

## 2020-11-27 NOTE — H&P (Signed)
Chief Complaint: Patient presented on account of generalized weakness and muscle cramps of a few days duration.  Brandon [name] is an 65 y.o. male with medical history significant for schizophrenia, hypertension, hyperlipidemia, COPD not on oxygen therapy, coronary disease status post stent and hypertension. He presented to the emergency room with complaints of feeling lightheaded and generally weak.  He also complained of muscle cramps.  Denied any loss of consciousness or seizures.  He has had poor urinary output over the past several days but denies dysuria or hematuria. He has had obstructive symptoms of frequency and urgency of micturition.  He endorses poor oral intake over the past 2 days due to his generalized weakness.  At baseline, patient is ADL, IADL independent.Limitation with his movement as a result of chronic arthritis.  He denies any abdominal pain, nausea or vomiting.  Presentation, work-up in the ED revealed hyponatremia with serum sodium of 123, hypochloremia.  Patient denies any excessive water drinking.  Reports having previously been admitted in the past on account of hyponatremia.  Review of records suggest his last ER visit in 04/2020 showed Serum Na of 129  Past Medical History:  Diagnosis Date  . Anemia    IDA  . Colon polyps   . COPD (chronic obstructive pulmonary disease) (HCC)   . Coronary artery disease   . Heart attack (HCC) 2005  . Hiatal hernia   . History of ETOH abuse   . Hyperlipidemia   . Hypertension   . Pleurisy   . Schizophrenia (HCC)   . Schizophrenia (HCC)   . Seizures (HCC)    grand mal in 2000 or 2001 recovery from alcoholism  . Suicide attempt [facility])     Past Surgical History:  Procedure Laterality Date  . ABDOMINAL SURGERY     internal bleeding  . CHOLECYSTECTOMY N/A 05/04/2018   Procedure: LAPAROSCOPIC CHOLECYSTECTOMY, converted to open;  Surgeon: Sung Amabile, DO;  Location: [facility] ORS;  Service: General;  Laterality: N/A;  .  COLONOSCOPY    . COLONOSCOPY WITH PROPOFOL N/A 05/09/2016   Procedure: COLONOSCOPY WITH PROPOFOL;  Surgeon: Scot Jun, MD;  Location: [facility] ENDOSCOPY;  Service: Endoscopy;  Laterality: N/A;  . CORONARY ANGIOPLASTY WITH STENT PLACEMENT     1 vessel  . ESOPHAGOGASTRODUODENOSCOPY (EGD) WITH PROPOFOL N/A 05/09/2016   Procedure: ESOPHAGOGASTRODUODENOSCOPY (EGD) WITH PROPOFOL;  Surgeon: Scot Jun, MD;  Location: [facility] ENDOSCOPY;  Service: Endoscopy;  Laterality: N/A;  . EYE SURGERY Right   . GLAUCOMA SURGERY    . LAPAROSCOPIC APPENDECTOMY N/A 07/17/2018   Procedure: APPENDECTOMY LAPAROSCOPIC;  Surgeon: Sung Amabile, DO;  Location: [facility] ORS;  Service: General;  Laterality: N/A;  . LEFT HEART CATH Right 10/19/2017   Procedure: Left Heart Cath and Coronary Angiography;  Surgeon: Laurier Nancy, MD;  Location: [facility] INVASIVE CV LAB;  Service: Cardiovascular;  Laterality: Right;  . NOSE SURGERY    . 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 VASECTOMY      Family History  Problem Relation Age of Onset  . Lung cancer Mother   . Hypertension Father   . Heart attack Father   . CAD Father   . Prostate cancer Neg Hx   . Bladder Cancer Neg Hx   . Kidney cancer Neg Hx    Social History:  reports that he quit smoking about 10 years ago. His smoking use included cigarettes. He smoked 1.00 pack per day. He has quit using smokeless tobacco. He reports that he  does not drink alcohol and does not use drugs.  Allergies:  Allergies  Allergen Reactions  . Compazine [Prochlorperazine] Other (See Comments)    Dystonic rxn - convulsions. Near fatal reaction.   Ardine Bjork [Iodinated Diagnostic Agents] Shortness Of Breath    Pt states SOB after last IV contrast injection  . Alcohol-Sulfur [Elemental Sulfur] Other (See Comments)    History of alcoholism  . Benadryl [Diphenhydramine] Other (See Comments)    "stuffy", nasal congestion  . Depakote [Divalproex Sodium] Other (See Comments)    Cause elevated  ammonia  . Dramamine [Dimenhydrinate] Swelling  . Plavix [Clopidogrel] Other (See Comments)    Rectal bleeding. "Perforated my intestines."  . Rosuvastatin     Other reaction(s): Other (See Comments)    (Not in a hospital admission)   Results for orders placed or performed during the hospital encounter of 11/27/20 (from the past 48 hour(s))  CBC     Status: Abnormal   Collection Time: 11/27/20  8:28 PM  Result Value Ref Range   WBC 8.1 4.0 - 10.5 K/uL   RBC 4.37 4.22 - 5.81 MIL/uL   Hemoglobin 12.7 (L) 13.0 - 17.0 g/dL   HCT 14.4 (L) 81.8 - 56.3 %   MCV 84.4 80.0 - 100.0 fL   MCH 29.1 26.0 - 34.0 pg   MCHC 34.4 30.0 - 36.0 g/dL   RDW 14.9 70.2 - 63.7 %   Platelets 320 150 - 400 K/uL   nRBC 0.0 0.0 - 0.2 %    Comment: Performed at [facility], [address] [place], [place] [address]  Comprehensive metabolic panel     Status: Abnormal   Collection Time: 11/27/20  8:28 PM  Result Value Ref Range   Sodium 123 (L) 135 - 145 mmol/L   Potassium 3.9 3.5 - 5.1 mmol/L   Chloride 90 (L) 98 - 111 mmol/L   CO2 25 22 - 32 mmol/L   Glucose, Bld 109 (H) 70 - 99 mg/dL    Comment: Glucose reference range applies only to samples taken after fasting for at least 8 hours.   BUN 9 8 - 23 mg/dL   Creatinine, Ser 0.27 0.61 - 1.24 mg/dL   Calcium 8.7 (L) 8.9 - 10.3 mg/dL   Total Protein 6.2 (L) 6.5 - 8.1 g/dL   Albumin 3.6 3.5 - 5.0 g/dL   AST 27 15 - 41 U/L   ALT 30 0 - 44 U/L   Alkaline Phosphatase 68 38 - 126 U/L   Total Bilirubin 0.6 0.3 - 1.2 mg/dL   GFR, Estimated >74 >12 mL/min    Comment: (NOTE) Calculated using the CKD-EPI Creatinine Equation (2021)    Anion gap 8 5 - 15    Comment: Performed at [facility], [address] [place], [place] [address]  CK     Status: None   Collection Time: 11/27/20  8:28 PM  Result Value Ref Range   Total CK 272 49 - 397 U/L    Comment: Performed at [facility], [address] [place], [place] [address]    DG Chest Portable 1 View  Result Date: 11/27/2020 CLINICAL DATA:  Weakness EXAM: PORTABLE CHEST 1 VIEW COMPARISON:  05/05/2020 FINDINGS: No focal opacity or pleural effusion. Stable cardiomediastinal silhouette with aortic atherosclerosis. No pneumothorax. IMPRESSION: No active disease. Electronically Signed   By: Jasmine Pang M.D.   On: 11/27/2020 20:57    Review of Systems  Constitutional: Positive for fever. Negative for diaphoresis  and fatigue.  HENT: Negative.   Eyes: Negative.   Respiratory: Positive for cough.   Gastrointestinal: Negative.   Endocrine: Negative.   Genitourinary: Negative.   Musculoskeletal: Negative.   Neurological: Negative.   Hematological: Negative.   Psychiatric/Behavioral: Negative.     Blood pressure (!) 159/100, pulse 87, temperature 97.7 F (36.5 C), temperature source Oral, resp. rate (!) 25, height 6' (1.829 m), weight 90.7 kg, SpO2 96 %. Physical Exam Constitutional:      Appearance: Normal appearance.  HENT:     Head: Normocephalic.  Cardiovascular:     Rate and Rhythm: Normal rate and regular rhythm.     Pulses: Normal pulses.  Pulmonary:     Effort: Pulmonary effort is normal.  Abdominal:     General: Abdomen is flat. Bowel sounds are normal.     Palpations: Abdomen is soft.  Musculoskeletal:        General: Tenderness present.     Cervical back: Normal range of motion and neck supple.  Skin:    General: Skin is warm and dry.  Neurological:     General: No focal deficit present.     Mental Status: He is alert and oriented to person, place, and time.  Psychiatric:        Mood and Affect: Mood normal.        Behavior: Behavior normal.      Assessment/Plan  Hyponatremia: Multifactorial.  Dehydration and hypovolemia most likely.  We will send urine osmolality and urine electrolytes.  Patient will be volume repleted with normal saline.  Interval serum sodium monitoring advised.  Accelerated hypertension: Blood pressure stable.  Resume home meds. PRN Hydralazine for optimization of BP.  Acute Dehydration: Secondary to poor oral intake.  Triggers for poor oral intake is unclear.  No obvious source of infection.  Chest x-ray is unremarkable.  Schizophrenia: Chronic and controlled on antipsychotics.  We will continue on home medication as patient's symptoms seems well controlled on current regimen.  CAD: Status post stent in the past.  Patient is asymptomatic on this admission.  Cardiac markers negative.  CPK levels of 242 and unremarkable.  Afib : History of paroxysmal atrial fibrillation.  Patient currently in sinus rhythm.  Continue with home medications.  Chronic anemia:   BPH: Positive urinary symptoms.  Patient reports currently not on Flomax at home.  Med rec suggests previous prescription for Flomax.  This will be resumed.  Urology follow-up as outpatient.  Chronic knee arthritis:  Knee braces in place. Analgesics PRN Lilia Pro, MD 11/27/2020, 11:32 PM

## 2020-11-27 NOTE — ED Provider Notes (Signed)
[facility] Emergency Department Provider Note    Event Date/Time   First MD Initiated Contact with Patient 11/27/20 2027     (approximate)  I have reviewed the triage vital signs and the nursing notes.   HISTORY  Chief Complaint Weakness    HPI Brandon [name] is a 65 y.o. male below listed past medical history presents to the ER for evaluation of muscle cramps and feeling lightheaded and weak over the past 2 days.  States that he was using the restroom this evening feeling like he was dehydrated and was unable to empty his bladder.  Denies any pain.  States he has only had 2 Gatorade bottles in the past 24hours.  Denies any abdominal pain.  No chest pain or pressure.  No palpitations.    Past Medical History:  Diagnosis Date  . Anemia    IDA  . Colon polyps   . COPD (chronic obstructive pulmonary disease) (HCC)   . Coronary artery disease   . Heart attack (HCC) 2005  . Hiatal hernia   . History of ETOH abuse   . Hyperlipidemia   . Hypertension   . Pleurisy   . Schizophrenia (HCC)   . Schizophrenia (HCC)   . Seizures (HCC)    grand mal in 2000 or 2001 recovery from alcoholism  . Suicide attempt [facility])    Family History  Problem Relation Age of Onset  . Lung cancer Mother   . Hypertension Father   . Heart attack Father   . CAD Father   . Prostate cancer Neg Hx   . Bladder Cancer Neg Hx   . Kidney cancer Neg Hx    Past Surgical History:  Procedure Laterality Date  . ABDOMINAL SURGERY     internal bleeding  . CHOLECYSTECTOMY N/A 05/04/2018   Procedure: LAPAROSCOPIC CHOLECYSTECTOMY, converted to open;  Surgeon: Sung Amabile, DO;  Location: [facility] ORS;  Service: General;  Laterality: N/A;  . COLONOSCOPY    . COLONOSCOPY WITH PROPOFOL N/A 05/09/2016   Procedure: COLONOSCOPY WITH PROPOFOL;  Surgeon: Scot Jun, MD;  Location: [facility] ENDOSCOPY;  Service: Endoscopy;  Laterality: N/A;  . CORONARY ANGIOPLASTY WITH STENT PLACEMENT     1 vessel   . ESOPHAGOGASTRODUODENOSCOPY (EGD) WITH PROPOFOL N/A 05/09/2016   Procedure: ESOPHAGOGASTRODUODENOSCOPY (EGD) WITH PROPOFOL;  Surgeon: Scot Jun, MD;  Location: [facility] ENDOSCOPY;  Service: Endoscopy;  Laterality: N/A;  . EYE SURGERY Right   . GLAUCOMA SURGERY    . LAPAROSCOPIC APPENDECTOMY N/A 07/17/2018   Procedure: APPENDECTOMY LAPAROSCOPIC;  Surgeon: Sung Amabile, DO;  Location: [facility] ORS;  Service: General;  Laterality: N/A;  . LEFT HEART CATH Right 10/19/2017   Procedure: Left Heart Cath and Coronary Angiography;  Surgeon: Laurier Nancy, MD;  Location: [facility] INVASIVE CV LAB;  Service: Cardiovascular;  Laterality: Right;  . NOSE SURGERY    . 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 VASECTOMY     Patient Active Problem List   Diagnosis Date Noted  . Appendicitis with perforation 07/17/2018  . Acute cholecystitis 04/10/2018  . Chronic cholecystitis 04/08/2018  . Atherosclerosis of native arteries of extremity with intermittent claudication (HCC) 11/22/2017  . Essential hypertension 11/22/2017  . Hyperlipidemia 11/22/2017  . COPD (chronic obstructive pulmonary disease) (HCC) 11/22/2017  . CAD (coronary artery disease) 10/15/2017  . Other toe(s) amputation status 11/17/2014  . Schizoaffective disorder, bipolar type (HCC) 08/23/2008      Prior to Admission medications   Medication Sig  Start Date End Date Taking? Authorizing Provider  acetaminophen (TYLENOL) 325 MG tablet Take 650 mg by mouth every 4 (four) hours as needed.    [provider]  aluminum-magnesium hydroxide-simethicone (MAALOX) 200-200-20 MG/5ML SUSP Take 30 mLs by mouth 4 (four) times daily -  before meals and at bedtime. 09/02/18   Sharman CheekStafford, Phillip, MD  bu[facility](WELLBUTRIN SR) 100 MG 12 hr tablet Take 100 mg by mouth daily.     [provider]  carvedilol (COREG) 25 MG tablet Take 25 mg by mouth 2 (two) times daily with a meal.    [provider]  cloZAPine (CLOZARIL) 100 MG tablet Take 200  mg by mouth at bedtime.     [provider]  docusate sodium (COLACE) 100 MG capsule Take 1 tablet once or twice daily as needed for constipation while taking narcotic pain medicine Patient taking differently: Take 100 mg by mouth daily.  04/23/18   Loleta RoseForbach, Cory, MD  dorzolamide-timolol (COSOPT) 22.3-6.8 MG/ML ophthalmic solution  03/26/20   [provider]  ezetimibe (ZETIA) 10 MG tablet Take 10 mg by mouth at bedtime.     [provider]  famotidine (PEPCID) 20 MG tablet Take 1 tablet (20 mg total) by mouth 2 (two) times daily. 06/15/18   Sharman CheekStafford, Phillip, MD  fenofibrate 160 MG tablet Take 160 mg by mouth daily.    [provider]  fluticasone (FLONASE) 50 MCG/ACT nasal spray Place 2 sprays into both nostrils daily.     [provider]  furosemide (LASIX) 20 MG tablet Take 20 mg by mouth 2 (two) times daily.    [provider]  HYDROcodone-acetaminophen (NORCO/VICODIN) 5-325 MG tablet Take 1 tablet by mouth every 6 (six) hours as needed. 06/23/18   [provider]  hydrOXYzine (VISTARIL) 25 MG capsule Take 25 mg by mouth 2 (two) times daily as needed for anxiety.    [provider]  isosorbide mononitrate (IMDUR) 30 MG 24 hr tablet Take 30 mg by mouth 2 (two) times daily.     [provider]  ivermectin (STROMECTOL) 3 MG TABS tablet ivermectin 3 mg tablet    [provider]  latanoprost (XALATAN) 0.005 % ophthalmic solution Place 1 drop into both eyes at bedtime. 09/15/17   [provider]  nitroGLYCERIN (NITROSTAT) 0.4 MG SL tablet Place 0.4 mg under the tongue every 5 (five) minutes as needed for chest pain.     [provider]  ondansetron (ZOFRAN ODT) 4 MG disintegrating tablet Take 1 tablet (4 mg total) by mouth every 8 (eight) hours as needed for nausea or vomiting. 09/02/18   Sharman CheekStafford, Phillip, MD  oxyCODONE-acetaminophen (PERCOCET/ROXICET) 5-325 MG tablet Take 1 tablet by mouth every 4  (four) hours as needed for severe pain. 05/05/20   Irean HongSung, Jade J, MD  pantoprazole (PROTONIX) 40 MG tablet Take 40 mg by mouth 2 (two) times daily.    [provider]  PARoxetine (PAXIL) 20 MG tablet Take 20 mg by mouth daily.    [provider]  polyethylene glycol powder (GLYCOLAX/MIRALAX) 17 GM/SCOOP powder Take as directed for colonic prep. 01/05/20   [provider]  senna (SENOKOT) 8.6 MG tablet Take 1-2 tablets by mouth daily as needed for constipation.    [provider]  simvastatin (ZOCOR) 40 MG tablet Take 40 mg by mouth at bedtime.     [provider]  SYMBICORT 80-4.5 MCG/ACT inhaler Inhale 2 puffs into the lungs 2 (two) times daily. 09/15/17  [provider]  tamsulosin (FLOMAX) 0.4 MG CAPS capsule Take 0.4 mg by mouth daily.     [provider]  timolol (TIMOPTIC) 0.5 % ophthalmic solution Place 1 drop into both eyes daily. 09/15/17   [provider]  traMADol (ULTRAM) 50 MG tablet Take 1 tablet (50 mg total) by mouth every 6 (six) hours as needed. 09/02/18   Sharman Cheek, MD    Allergies Compazine [prochlorperazine], Ivp dye [iodinated diagnostic agents], Alcohol-sulfur [elemental sulfur], Benadryl [diphenhydramine], Depakote [divalproex sodium], Dramamine [dimenhydrinate], Plavix [clopidogrel], and Rosuvastatin    Social History Social History   Tobacco Use  . Smoking status: Former Smoker    Packs/day: 1.00    Types: Cigarettes    Quit date: 2012    Years since quitting: 10.3  . Smokeless tobacco: Former Clinical biochemist  . Vaping Use: Never used  Substance Use Topics  . Alcohol use: No    Comment: no alcohol since 2010  . Drug use: No    Review of Systems Patient denies headaches, rhinorrhea, blurry vision, numbness, shortness of breath, chest pain, edema, cough, abdominal pain, nausea, vomiting, diarrhea, dysuria, fevers, rashes or hallucinations unless otherwise stated above in  HPI. ____________________________________________   PHYSICAL EXAM:  VITAL SIGNS: Vitals:   11/27/20 2100 11/27/20 2200  BP: (!) 144/92 (!) 141/90  Pulse: 75 79  Resp: (!) 24 (!) 26  Temp:    SpO2: 97% 97%    Constitutional: Alert and oriented.  Eyes: Conjunctivae are normal.  Head: Atraumatic. Nose: No congestion/rhinnorhea. Mouth/Throat: Mucous membranes are moist.   Neck: No stridor. Painless ROM.  Cardiovascular: Normal rate, regular rhythm. Grossly normal heart sounds.  Good peripheral circulation. Respiratory: Normal respiratory effort.  No retractions. Lungs CTAB. Gastrointestinal: Soft and nontender. No distention. No abdominal bruits. No CVA tenderness. Genitourinary:  Musculoskeletal: No lower extremity tenderness nor edema.  No joint effusions. Neurologic:  Normal speech and language. No gross focal neurologic deficits are appreciated. No facial droop Skin:  Skin is warm, dry and intact. No rash noted. Psychiatric: Mood and affect are normal. Speech and behavior are normal.  ____________________________________________   LABS (all labs ordered are listed, but only abnormal results are displayed)  Results for orders placed or performed during the hospital encounter of 11/27/20 (from the past 24 hour(s))  CBC     Status: Abnormal   Collection Time: 11/27/20  8:28 PM  Result Value Ref Range   WBC 8.1 4.0 - 10.5 K/uL   RBC 4.37 4.22 - 5.81 MIL/uL   Hemoglobin 12.7 (L) 13.0 - 17.0 g/dL   HCT 31.5 (L) 40.0 - 86.7 %   MCV 84.4 80.0 - 100.0 fL   MCH 29.1 26.0 - 34.0 pg   MCHC 34.4 30.0 - 36.0 g/dL   RDW 61.9 50.9 - 32.6 %   Platelets 320 150 - 400 K/uL   nRBC 0.0 0.0 - 0.2 %  Comprehensive metabolic panel     Status: Abnormal   Collection Time: 11/27/20  8:28 PM  Result Value Ref Range   Sodium 123 (L) 135 - 145 mmol/L   Potassium 3.9 3.5 - 5.1 mmol/L   Chloride 90 (L) 98 - 111 mmol/L   CO2 25 22 - 32 mmol/L   Glucose, Bld 109 (H) 70 - 99 mg/dL   BUN 9 8 -  23 mg/dL   Creatinine, Ser 7.12 0.61 - 1.24 mg/dL   Calcium 8.7 (L) 8.9 - 10.3 mg/dL   Total Protein 6.2 (L)  6.5 - 8.1 g/dL   Albumin 3.6 3.5 - 5.0 g/dL   AST 27 15 - 41 U/L   ALT 30 0 - 44 U/L   Alkaline Phosphatase 68 38 - 126 U/L   Total Bilirubin 0.6 0.3 - 1.2 mg/dL   GFR, Estimated >09 >32 mL/min   Anion gap 8 5 - 15  CK     Status: None   Collection Time: 11/27/20  8:28 PM  Result Value Ref Range   Total CK 272 49 - 397 U/L   ____________________________________________  EKG My review and personal interpretation at Time: 20:22   Indication: weakness  Rate: 85  Rhythm: sinus Axis: normal Other: lvh, no stemi, nonspecific aberrant conduction ____________________________________________  RADIOLOGY  I personally reviewed all radiographic images ordered to evaluate for the above acute complaints and reviewed radiology reports and findings.  These findings were personally discussed with the patient.  Please see medical record for radiology report.  ____________________________________________   PROCEDURES  Procedure(s) performed:  Procedures    Critical Care performed: no ____________________________________________   INITIAL IMPRESSION / ASSESSMENT AND PLAN / ED COURSE  Pertinent labs & imaging results that were available during my care of the patient were reviewed by me and considered in my medical decision making (see chart for details).   DDX: Electrolyte derangement, dehydration, rhabdo, hypertension, medication effect, UTI, AKI  Brandon [name] is a 65 y.o. who presents to the ED with presentation as described above.  Patient nontoxic-appearing reporting symptoms as described above blood work sent for the above differential.  Does have evidence of significant hyponatremia.  Patient given IV fluids.  CK normal.  No sign of infiltrates.  No lateralizing weakness.  May be medication related or simply secondary to poor p.o. intake will give IV fluids as I do think he  is dehydrated discussed with hospitalist for admission for further medical management.     The patient was evaluated in Emergency Department today for the symptoms described in the history of present illness. He/she was evaluated in the context of the global COVID-19 pandemic, which necessitated consideration that the patient might be at risk for infection with the SARS-CoV-2 virus that causes COVID-19. Institutional protocols and algorithms that pertain to the evaluation of patients at risk for COVID-19 are in a state of rapid change based on information released by regulatory bodies including the CDC and federal and state organizations. These policies and algorithms were followed during the patient's care in the ED.  As part of my medical decision making, I reviewed the following data within the electronic MEDICAL RECORD NUMBER Nursing notes reviewed and incorporated, Labs reviewed, notes from prior ED visits and  Controlled Substance Database   ____________________________________________   FINAL CLINICAL IMPRESSION(S) / ED DIAGNOSES  Final diagnoses:  Weakness  Hyponatremia      NEW MEDICATIONS STARTED DURING THIS VISIT:  New Prescriptions   No medications on file     Note:  This document was prepared using Dragon voice recognition software and may include unintentional dictation errors.    Willy Eddy, MD 11/27/20 2258

## 2020-11-28 DIAGNOSIS — I10 Essential (primary) hypertension: Secondary | ICD-10-CM | POA: Diagnosis not present

## 2020-11-28 DIAGNOSIS — R531 Weakness: Secondary | ICD-10-CM | POA: Diagnosis not present

## 2020-11-28 DIAGNOSIS — F209 Schizophrenia, unspecified: Secondary | ICD-10-CM | POA: Diagnosis not present

## 2020-11-28 DIAGNOSIS — E871 Hypo-osmolality and hyponatremia: Secondary | ICD-10-CM | POA: Diagnosis not present

## 2020-11-28 LAB — CBC WITH DIFFERENTIAL/PLATELET
Abs Immature Granulocytes: 0.05 10*3/uL (ref 0.00–0.07)
Basophils Absolute: 0.1 10*3/uL (ref 0.0–0.1)
Basophils Relative: 1 %
Eosinophils Absolute: 0.1 10*3/uL (ref 0.0–0.5)
Eosinophils Relative: 2 %
HCT: 38.7 % — ABNORMAL LOW (ref 39.0–52.0)
Hemoglobin: 12.7 g/dL — ABNORMAL LOW (ref 13.0–17.0)
Immature Granulocytes: 1 %
Lymphocytes Relative: 23 %
Lymphs Abs: 1.6 10*3/uL (ref 0.7–4.0)
MCH: 27.5 pg (ref 26.0–34.0)
MCHC: 32.8 g/dL (ref 30.0–36.0)
MCV: 83.9 fL (ref 80.0–100.0)
Monocytes Absolute: 0.7 10*3/uL (ref 0.1–1.0)
Monocytes Relative: 10 %
Neutro Abs: 4.3 10*3/uL (ref 1.7–7.7)
Neutrophils Relative %: 63 %
Platelets: 270 10*3/uL (ref 150–400)
RBC: 4.61 MIL/uL (ref 4.22–5.81)
RDW: 13.5 % (ref 11.5–15.5)
WBC: 6.8 10*3/uL (ref 4.0–10.5)
nRBC: 0 % (ref 0.0–0.2)

## 2020-11-28 LAB — BASIC METABOLIC PANEL
Anion gap: 8 (ref 5–15)
BUN: 6 mg/dL — ABNORMAL LOW (ref 8–23)
CO2: 24 mmol/L (ref 22–32)
Calcium: 8.8 mg/dL — ABNORMAL LOW (ref 8.9–10.3)
Chloride: 97 mmol/L — ABNORMAL LOW (ref 98–111)
Creatinine, Ser: 0.78 mg/dL (ref 0.61–1.24)
GFR, Estimated: >60 mL/min (ref >60)
Glucose, Bld: 95 mg/dL (ref 70–99)
Potassium: 3.8 mmol/L (ref 3.5–5.1)
Sodium: 129 mmol/L — ABNORMAL LOW (ref 135–145)

## 2020-11-28 LAB — HEMOGLOBIN A1C
Hgb A1c MFr Bld: 6.3 % — ABNORMAL HIGH (ref 4.8–5.6)
Mean Plasma Glucose: 134.11 mg/dL

## 2020-11-28 LAB — TSH: TSH: 1.492 u[IU]/mL (ref 0.350–4.500)

## 2020-11-28 LAB — MAGNESIUM: Magnesium: 1.7 mg/dL (ref 1.7–2.4)

## 2020-11-28 LAB — OSMOLALITY, URINE: Osmolality, Ur: 299 mOsm/kg — ABNORMAL LOW (ref 300–900)

## 2020-11-28 LAB — HIV ANTIBODY (ROUTINE TESTING W REFLEX): HIV Screen 4th Generation wRfx: NONREACTIVE

## 2020-11-28 LAB — PHOSPHORUS: Phosphorus: 4.3 mg/dL (ref 2.5–4.6)

## 2020-11-28 MED ORDER — PNEUMOCOCCAL VAC POLYVALENT 25 MCG/0.5ML IJ INJ
0.5000 mL | INJECTION | INTRAMUSCULAR | Status: AC
  Administered 2020-11-29: 0.5 mL via INTRAMUSCULAR
  Filled 2020-11-28: qty 0.5

## 2020-11-28 MED ORDER — SODIUM CHLORIDE 0.9 % IV SOLN: INTRAVENOUS | Status: DC

## 2020-11-28 NOTE — Consult Note (Signed)
Pharmacy - Clozapine     This patient's order has been reviewed for prescribing contraindications.   Labs:   4/20 ANC 4,300  The medication is being dispensed pursuant to the FDA REMS suspension order of 06/29/20 that allows for dispensing without a patient REMS dispense authorization (RDA).   Dose:  Clozapine 200mg  once daily at bedtime.  Plan: Will follow up with CBC with differential in at least one week, likely sooner   , PharmD Clinical Pharmacist 11/28/2020 1:23 PM

## 2020-11-28 NOTE — ED Notes (Addendum)
Accepting RN notified of elevated BP that was treated in ED prior to departure. Pt is asymptomatic. No concerns posed to this RN from primary RN [employer]

## 2020-11-28 NOTE — Progress Notes (Signed)
[facility]  - [place] at [facility]   PATIENT NAME: Brandon [name]    MR#:  [id number]  DATE OF BIRTH:  09-19-1955  SUBJECTIVE:   Patient came in after feeling weak and decreased urine output. He has been eating well and after receiving your IV fluids urine output is important. Overall feels better than yesterday. REVIEW OF SYSTEMS:   Review of Systems  Constitutional: Negative for chills, fever and weight loss.  HENT: Negative for ear discharge, ear pain and nosebleeds.   Eyes: Negative for blurred vision, pain and discharge.  Respiratory: Negative for sputum production, shortness of breath, wheezing and stridor.   Cardiovascular: Negative for chest pain, palpitations, orthopnea and PND.  Gastrointestinal: Negative for abdominal pain, diarrhea, nausea and vomiting.  Genitourinary: Negative for frequency and urgency.  Musculoskeletal: Negative for back pain and joint pain.  Neurological: Positive for weakness. Negative for sensory change, speech change and focal weakness.  Psychiatric/Behavioral: Negative for depression and hallucinations. The patient is not nervous/anxious.    Tolerating Diet:yes Tolerating PT: pending  DRUG ALLERGIES:   Allergies  Allergen Reactions  . Compazine [Prochlorperazine] Other (See Comments)    Dystonic rxn - convulsions. Near fatal reaction.   Ardine Bjork [Iodinated Diagnostic Agents] Shortness Of Breath    Pt states SOB after last IV contrast injection  . Alcohol-Sulfur [Elemental Sulfur] Other (See Comments)    History of alcoholism  . Benadryl [Diphenhydramine] Other (See Comments)    "stuffy", nasal congestion  . Depakote [Divalproex Sodium] Other (See Comments)    Cause elevated ammonia  . Dramamine [Dimenhydrinate] Swelling  . Plavix [Clopidogrel] Other (See Comments)    Rectal bleeding. "Perforated my intestines."  . Rosuvastatin     Other reaction(s): Other (See Comments)    VITALS:  Blood pressure (!) 138/100,  pulse 87, temperature 98.2 F (36.8 C), temperature source Oral, resp. rate 17, height 6' (1.829 m), weight 90.7 kg, SpO2 99 %.  PHYSICAL EXAMINATION:   Physical Exam  GENERAL:  65 y.o.-year-old patient lying in the bed with no acute distress.  LUNGS: Normal breath sounds bilaterally, no wheezing, rales, rhonchi. No use of accessory muscles of respiration.  CARDIOVASCULAR: S1, S2 normal. No murmurs, rubs, or gallops.  ABDOMEN: Soft, nontender, nondistended. Bowel sounds present. No organomegaly or mass.  EXTREMITIES: No cyanosis, clubbing or edema b/l.    NEUROLOGIC: Cranial nerves II through XII are intact. No focal Motor or sensory deficits b/l.   PSYCHIATRIC:  patient is alert and oriented x 3.  SKIN: No obvious rash, lesion, or ulcer.   LABORATORY PANEL:  CBC Recent Labs  Lab 11/28/20 0553  WBC 6.8  HGB 12.7*  HCT 38.7*  PLT 270    Chemistries  Recent Labs  Lab 11/27/20 2028 11/28/20 0553  NA 123* 129*  K 3.9 3.8  CL 90* 97*  CO2 25 24  GLUCOSE 109* 95  BUN 9 6*  CREATININE 0.81 0.78  CALCIUM 8.7* 8.8*  MG  --  1.7  AST 27  --   ALT 30  --   ALKPHOS 68  --   BILITOT 0.6  --    Cardiac Enzymes No results for input(s): TROPONINI in the last 168 hours. RADIOLOGY:  DG Chest Portable 1 View  Result Date: 11/27/2020 CLINICAL DATA:  Weakness EXAM: PORTABLE CHEST 1 VIEW COMPARISON:  05/05/2020 FINDINGS: No focal opacity or pleural effusion. Stable cardiomediastinal silhouette with aortic atherosclerosis. No pneumothorax. IMPRESSION: No active disease. Electronically Signed   By:  Jasmine Pang M.D.   On: 11/27/2020 20:57   ASSESSMENT AND PLAN:  Brandon [name] is an 65 y.o. male with medical history significant for schizophrenia, hypertension, hyperlipidemia, COPD not on oxygen therapy, coronary disease status post stent and hypertension. He presented to the emergency room with complaints of feeling lightheaded and generally weak.  He also complained of muscle  cramps.  Hyponatremia: Multifactorial.  -- Dehydration, poor pot intake and hypovolemia most likely.  --IVF --came in with Sodium of 123--129 --eating well, stable mentation --good uop  Accelerated hypertension: Blood pressure stable. Resume home meds. PRN Hydralazine for optimization of BP.  Chronic Schizophrenia: Chronic and controlled on antipsychotics.  -- We will continue on home medication as patient's symptoms seems well controlled on current regimen.  CAD: Status post stent in the past.  Patient is asymptomatic on this admission.  Cardiac markers negative.  CPK levels of 242 and unremarkable.  Afib : History of paroxysmal atrial fibrillation.  Patient currently in sinus rhythm.  Continue with home medications.  Chronic anemia:  hgb stable  BPH: Positive urinary symptoms.  Patient reports currently not on Flomax at home.  Med rec suggests previous prescription for Flomax.  This will be resumed.  Urology follow-up as outpatient. --making good Urine after receiving IVF  Chronic knee arthritis:  Knee braces in place. Analgesics PRN  Family communication :none Consults :none CODE STATUS: full DVT Prophylaxis :lovenox Level of care: Med-Surg Status is: Inpatient  Remains inpatient appropriate because:Inpatient level of care appropriate due to severity of illness   Dispo: The patient is from: Home              Anticipated d/c is to: Home              Patient currently is not medically stable to d/c.   Difficult to place patient No        TOTAL TIME TAKING CARE OF THIS PATIENT: 25 minutes.  >50% time spent on counselling and coordination of care  Note: This dictation was prepared with Dragon dictation along with smaller phrase technology. Any transcriptional errors that result from this process are unintentional.  Enedina Finner M.D    [facility]   CC: Primary care physician; Sherron Monday, MDPatient ID: Brandon [name], male   DOB: November 19, 1955,  65 y.o.   MRN: [id number]

## 2020-11-29 ENCOUNTER — Inpatient Hospital Stay
Admit: 2020-11-29 | Discharge: 2020-11-29 | Disposition: A | Discharge status: Home / Self Care | Payer: Medicare Other | Attending: Nephrology | Admitting: Nephrology

## 2020-11-29 DIAGNOSIS — F209 Schizophrenia, unspecified: Secondary | ICD-10-CM | POA: Diagnosis not present

## 2020-11-29 DIAGNOSIS — R531 Weakness: Secondary | ICD-10-CM | POA: Diagnosis not present

## 2020-11-29 DIAGNOSIS — E871 Hypo-osmolality and hyponatremia: Secondary | ICD-10-CM | POA: Diagnosis not present

## 2020-11-29 DIAGNOSIS — I10 Essential (primary) hypertension: Secondary | ICD-10-CM | POA: Diagnosis not present

## 2020-11-29 LAB — PROTEIN ELECTRO, RANDOM URINE
Albumin ELP, Urine: 30 %
Alpha-1-Globulin, U: 9.7 %
Alpha-2-Globulin, U: 14.9 %
Beta Globulin, U: 28.1 %
Gamma Globulin, U: 17.2 %
Total Protein, Urine: 7.7 mg/dL

## 2020-11-29 LAB — BASIC METABOLIC PANEL WITH GFR
Anion gap: 5 (ref 5–15)
BUN: 6 mg/dL — ABNORMAL LOW (ref 8–23)
CO2: 25 mmol/L (ref 22–32)
Calcium: 8.3 mg/dL — ABNORMAL LOW (ref 8.9–10.3)
Chloride: 96 mmol/L — ABNORMAL LOW (ref 98–111)
Creatinine, Ser: 0.61 mg/dL (ref 0.61–1.24)
GFR, Estimated: >60 mL/min
Glucose, Bld: 105 mg/dL — ABNORMAL HIGH (ref 70–99)
Potassium: 3.8 mmol/L (ref 3.5–5.1)
Sodium: 126 mmol/L — ABNORMAL LOW (ref 135–145)

## 2020-11-29 MED ORDER — IRBESARTAN 150 MG PO TABS
300.0000 mg | ORAL_TABLET | Freq: Every day | ORAL | Status: DC
  Administered 2020-11-29 – 2020-12-01 (×3): 300 mg via ORAL
  Filled 2020-11-29 (×3): qty 2

## 2020-11-29 NOTE — Progress Notes (Signed)
[facility]  - [place] at [facility]   PATIENT NAME: Brandon [name]    MR#:  [id number]  DATE OF BIRTH:  06-17-1956  SUBJECTIVE:   Patient tells me he is having palpitation. He ate a small muffin and some eggs this morning. Denies any other complaints.  REVIEW OF SYSTEMS:   Review of Systems  Constitutional: Negative for chills, fever and weight loss.  HENT: Negative for ear discharge, ear pain and nosebleeds.   Eyes: Negative for blurred vision, pain and discharge.  Respiratory: Negative for sputum production, shortness of breath, wheezing and stridor.   Cardiovascular: Negative for chest pain, palpitations, orthopnea and PND.  Gastrointestinal: Negative for abdominal pain, diarrhea, nausea and vomiting.  Genitourinary: Negative for frequency and urgency.  Musculoskeletal: Negative for back pain and joint pain.  Neurological: Positive for weakness. Negative for sensory change, speech change and focal weakness.  Psychiatric/Behavioral: Negative for depression and hallucinations. The patient is not nervous/anxious.    Tolerating Diet:yes Tolerating PT: pending  DRUG ALLERGIES:   Allergies  Allergen Reactions  . Compazine [Prochlorperazine] Other (See Comments)    Dystonic rxn - convulsions. Near fatal reaction.   Ardine Bjork [Iodinated Diagnostic Agents] Shortness Of Breath    Pt states SOB after last IV contrast injection  . Alcohol-Sulfur [Elemental Sulfur] Other (See Comments)    History of alcoholism  . Benadryl [Diphenhydramine] Other (See Comments)    "stuffy", nasal congestion  . Depakote [Divalproex Sodium] Other (See Comments)    Cause elevated ammonia  . Dramamine [Dimenhydrinate] Swelling  . Plavix [Clopidogrel] Other (See Comments)    Rectal bleeding. "Perforated my intestines."  . Rosuvastatin     Other reaction(s): Other (See Comments)    VITALS:  Blood pressure (!) 169/94, pulse 78, temperature 97.8 F (36.6 C), temperature source Oral,  resp. rate 17, height 6' (1.829 m), weight 90.7 kg, SpO2 97 %.  PHYSICAL EXAMINATION:   Physical Exam  GENERAL:  65 y.o.-year-old patient lying in the bed with no acute distress.  LUNGS: Normal breath sounds bilaterally, no wheezing, rales, rhonchi. No use of accessory muscles of respiration.  CARDIOVASCULAR: S1, S2 normal. No murmurs, rubs, or gallops.  ABDOMEN: Soft, nontender, nondistended. Bowel sounds present. No organomegaly or mass.  EXTREMITIES: No cyanosis, clubbing or edema b/l.    NEUROLOGIC: Cranial nerves II through XII are intact. No focal Motor or sensory deficits b/l.   PSYCHIATRIC:  patient is alert and oriented x 3.  SKIN: No obvious rash, lesion, or ulcer.   LABORATORY PANEL:  CBC Recent Labs  Lab 11/28/20 0553  WBC 6.8  HGB 12.7*  HCT 38.7*  PLT 270    Chemistries  Recent Labs  Lab 11/27/20 2028 11/28/20 0553 11/29/20 0545  NA 123* 129* 126*  K 3.9 3.8 3.8  CL 90* 97* 96*  CO2 25 24 25   GLUCOSE 109* 95 105*  BUN 9 6* 6*  CREATININE 0.81 0.78 0.61  CALCIUM 8.7* 8.8* 8.3*  MG  --  1.7  --   AST 27  --   --   ALT 30  --   --   ALKPHOS 68  --   --   BILITOT 0.6  --   --    Cardiac Enzymes No results for input(s): TROPONINI in the last 168 hours. RADIOLOGY:  DG Chest Portable 1 View  Result Date: 11/27/2020 CLINICAL DATA:  Weakness EXAM: PORTABLE CHEST 1 VIEW COMPARISON:  05/05/2020 FINDINGS: No focal opacity or pleural effusion.  Stable cardiomediastinal silhouette with aortic atherosclerosis. No pneumothorax. IMPRESSION: No active disease. Electronically Signed   By: Brandon [name] M.D.   On: 11/27/2020 20:57   ASSESSMENT AND PLAN:  Brandon [name] is an 65 y.o. male with medical history significant for schizophrenia, hypertension, hyperlipidemia, COPD not on oxygen therapy, coronary disease status post stent and hypertension. He presented to the emergency room with complaints of feeling lightheaded and generally weak.  He also complained of muscle  cramps.  Hyponatremia: Multifactorial.  -- Dehydration, poor po intake and hypovolemia most likely. ?psych meds --came in with Sodium of 123--129--126--stopped ivf --eating well, stable mentation --good uop -- nephrology consultation-- follow recommendation -- Patient has had history of hyponatremia in the past  Accelerated hypertension: Blood pressure stable.  -Resume home meds. PRN Hydralazine for optimization of BP.  Chronic Schizophrenia: Chronic and controlled on antipsychotics.   CAD: Status post stent in the past.  Patient is asymptomatic on this admission.  Cardiac markers negative.  CPK levels of 242 and unremarkable.  Afib : History of paroxysmal atrial fibrillation.  Patient currently in sinus rhythm.  Continue with home medications.  Chronic anemia:  hgb stable  BPH: Positive urinary symptoms.  Patient reports currently not on Flomax at home.  Med rec suggests previous prescription for Flomax.  This will be resumed.  Urology follow-up as outpatient. --making good Urine after receiving IVF  Chronic knee arthritis:  Knee braces in place. Analgesics PRN  Family communication :none Consults :none CODE STATUS: full DVT Prophylaxis :lovenox Level of care: Med-Surg Status is: Inpatient  Remains inpatient appropriate because:Inpatient level of care appropriate due to severity of illness  Patient continues to have low sodium. He is otherwise asymptomatic. Nephrology consultation placed PT yet to see patient.  Dispo: The patient is from: Home              Anticipated d/c is to: Home              Patient currently is not medically stable to d/c.   Difficult to place patient No        TOTAL TIME TAKING CARE OF THIS PATIENT: 25 minutes.  >50% time spent on counselling and coordination of care  Note: This dictation was prepared with Dragon dictation along with smaller phrase technology. Any transcriptional errors that result from this process are  unintentional.  Brandon [name] M.D    [facility]   CC: Primary care physician; Brandon [name], MDPatient ID: Brandon [name], male   DOB: Jul 10, 1956, 65 y.o.   MRN: [id number]

## 2020-11-29 NOTE — Consult Note (Signed)
[facility]  CONSULT NOTE    Date: 11/29/2020                  Patient Name:  Brandon [name]  MRN: [id number]  DOB: 27-Jul-1956  Age / Sex: 65 y.o., male         PCP: Sherron Monday, MD                 Service Requesting Consult: Dr. Allena Katz                 Reason for Consult: Hyponatremia            History of Present Illness: Mr. Brandon [name] admitted with hyponatremia. Patient has been evaluated for hyponatremia by my practice in the past. Last seen on 11/2015 where it was determined that patient had polydipsia. However on this admission, patient denies any polydipsia.   Working with physical therapy this morning. Patient states he has been complaint with his medications. He has been getting weaker and has been using a walker recently. He lives by himself.    Medications: Outpatient medications: Medications Prior to Admission  Medication Sig Dispense Refill Last Dose  . acetaminophen (TYLENOL) 325 MG tablet Take 650 mg by mouth every 4 (four) hours as needed for mild pain or moderate pain.   Unknown at PRN  . buPROPion (WELLBUTRIN SR) 100 MG 12 hr tablet Take 100 mg by mouth daily.    Past Week at Unknown  . carvedilol (COREG) 25 MG tablet Take 25 mg by mouth 2 (two) times daily with a meal.   Past Week at Unknown  . cloZAPine (CLOZARIL) 100 MG tablet Take 200 mg by mouth at bedtime.    Past Week at Unknown  . dorzolamide-timolol (COSOPT) 22.3-6.8 MG/ML ophthalmic solution Place 1 drop into both eyes 2 (two) times daily.     Marland Kitchen ezetimibe (ZETIA) 10 MG tablet Take 10 mg by mouth at bedtime.    Past Week at Unknown  . hydrOXYzine (VISTARIL) 25 MG capsule Take 25 mg by mouth 2 (two) times daily as needed for anxiety.   Unknown at PRN  . isosorbide mononitrate (IMDUR) 30 MG 24 hr tablet Take 30 mg by mouth 2 (two) times daily.    Past Week at Unknown  . latanoprost (XALATAN) 0.005 % ophthalmic solution Place 1 drop into both eyes at bedtime.   Past Week at  Unknown  . nitroGLYCERIN (NITROSTAT) 0.4 MG SL tablet Place 0.4 mg under the tongue every 5 (five) minutes as needed for chest pain.    Unknown at PRN  . olmesartan (BENICAR) 20 MG tablet Take 20 mg by mouth daily.   Past Week at Unknown  . pantoprazole (PROTONIX) 40 MG tablet Take 40 mg by mouth 2 (two) times daily.   Past Week at Unknown  . senna (SENOKOT) 8.6 MG tablet Take 1-2 tablets by mouth daily as needed for constipation.   Unknown at PRN  . simvastatin (ZOCOR) 40 MG tablet Take 40 mg by mouth at bedtime.    Past Week at Unknown  . SYMBICORT 80-4.5 MCG/ACT inhaler Inhale 2 puffs into the lungs 2 (two) times daily.   Past Week at Unknown  . tamsulosin (FLOMAX) 0.4 MG CAPS capsule Take 0.4 mg by mouth daily.    Past Week at Unknown  . metoprolol tartrate (LOPRESSOR) 50 MG tablet Take 50 mg by mouth 2 (two) times daily.     . sacubitril-valsartan (ENTRESTO)  24-26 MG Take 1 tablet by mouth 2 (two) times daily.       Current medications: Current Facility-Administered Medications  Medication Dose Route Frequency Provider Last Rate Last Admin  . acetaminophen (TYLENOL) tablet 650 mg  650 mg Oral Q6H PRN Acheampong, Genice Rouge, MD      . albuterol (PROVENTIL) (2.5 MG/3ML) 0.083% nebulizer solution 2.5 mg  2.5 mg Nebulization Q2H PRN Acheampong, Genice Rouge, MD      . alum & mag hydroxide-simeth (MAALOX/MYLANTA) 200-200-20 MG/5ML suspension 30 mL  30 mL Oral TID AC & HS Acheampong, Genice Rouge, MD   30 mL at 11/28/20 2121  . buPROPion [facility] SR) 12 hr tablet 100 mg  100 mg Oral Daily Acheampong, Genice Rouge, MD   100 mg at 11/28/20 0959  . carvedilol (COREG) tablet 25 mg  25 mg Oral BID WC Acheampong, Genice Rouge, MD   25 mg at 11/28/20 1743  . cloZAPine (CLOZARIL) tablet 200 mg  200 mg Oral QHS Acheampong, Genice Rouge, MD   150 mg at 11/28/20 2122  . docusate sodium (COLACE) capsule 100 mg  100 mg Oral Daily Acheampong, Genice Rouge, MD   100 mg at 11/28/20 0955  . dorzolamide-timolol (COSOPT) 22.3-6.8 MG/ML  ophthalmic solution 1 drop  1 drop Both Eyes BID Acheampong, Genice Rouge, MD   1 drop at 11/28/20 2146  . enoxaparin (LOVENOX) injection 40 mg  40 mg Subcutaneous Q24H Acheampong, Genice Rouge, MD   40 mg at 11/28/20 0956  . ezetimibe (ZETIA) tablet 10 mg  10 mg Oral QHS Acheampong, Genice Rouge, MD   10 mg at 11/28/20 2122  . famotidine (PEPCID) tablet 20 mg  20 mg Oral BID Acheampong, Genice Rouge, MD   20 mg at 11/28/20 2122  . fenofibrate tablet 160 mg  160 mg Oral Daily Lilia Pro, MD   160 mg at 11/28/20 0958  . fluticasone (FLONASE) 50 MCG/ACT nasal spray 2 spray  2 spray Each Nare Daily Acheampong, Genice Rouge, MD   2 spray at 11/28/20 [phone]  . fluticasone furoate-vilanterol (BREO ELLIPTA) 100-25 MCG/INH 1 puff  1 puff Inhalation Daily Acheampong, Genice Rouge, MD   1 puff at 11/28/20 0959  . hydrALAZINE (APRESOLINE) injection 10 mg  10 mg Intravenous Q6H PRN Lilia Pro, MD   10 mg at 11/28/20 0529  . HYDROcodone-acetaminophen (NORCO/VICODIN) 5-325 MG per tablet 1 tablet  1 tablet Oral Q6H PRN Acheampong, Genice Rouge, MD      . hydrOXYzine (VISTARIL) capsule 25 mg  25 mg Oral BID PRN Acheampong, Genice Rouge, MD      . isosorbide mononitrate (IMDUR) 24 hr tablet 30 mg  30 mg Oral BID Lilia Pro, MD   30 mg at 11/28/20 2122  . latanoprost (XALATAN) 0.005 % ophthalmic solution 1 drop  1 drop Both Eyes QHS Acheampong, Genice Rouge, MD   1 drop at 11/28/20 2147  . nitroGLYCERIN (NITROSTAT) SL tablet 0.4 mg  0.4 mg Sublingual Q5 min PRN Acheampong, Genice Rouge, MD      . pneumococcal 23 valent vaccine (PNEUMOVAX-23) injection 0.5 mL  0.5 mL Intramuscular Tomorrow-1000 Acheampong, Genice Rouge, MD      . senna (SENOKOT) tablet 8.6-17.2 mg  1-2 tablet Oral Daily PRN Lilia Pro, MD      . simvastatin (ZOCOR) tablet 40 mg  40 mg Oral QHS Lilia Pro, MD   40 mg at 11/28/20 2121  . tamsulosin (FLOMAX) capsule 0.4 mg  0.4 mg  Oral Daily Acheampong, Genice Rouge, MD   0.4 mg at 11/28/20 0258       Allergies: Allergies  Allergen Reactions  . Compazine [Prochlorperazine] Other (See Comments)    Dystonic rxn - convulsions. Near fatal reaction.   Ardine Bjork [Iodinated Diagnostic Agents] Shortness Of Breath    Pt states SOB after last IV contrast injection  . Alcohol-Sulfur [Elemental Sulfur] Other (See Comments)    History of alcoholism  . Benadryl [Diphenhydramine] Other (See Comments)    "stuffy", nasal congestion  . Depakote [Divalproex Sodium] Other (See Comments)    Cause elevated ammonia  . Dramamine [Dimenhydrinate] Swelling  . Plavix [Clopidogrel] Other (See Comments)    Rectal bleeding. "Perforated my intestines."  . Rosuvastatin     Other reaction(s): Other (See Comments)      Past Medical History: Past Medical History:  Diagnosis Date  . Anemia    IDA  . Colon polyps   . COPD (chronic obstructive pulmonary disease) (HCC)   . Coronary artery disease   . Heart attack (HCC) 2005  . Hiatal hernia   . History of ETOH abuse   . Hyperlipidemia   . Hypertension   . Pleurisy   . Schizophrenia (HCC)   . Schizophrenia (HCC)   . Seizures (HCC)    grand mal in 2000 or 2001 recovery from alcoholism  . Suicide attempt [facility])      Past Surgical History: Past Surgical History:  Procedure Laterality Date  . ABDOMINAL SURGERY     internal bleeding  . CHOLECYSTECTOMY N/A 05/04/2018   Procedure: LAPAROSCOPIC CHOLECYSTECTOMY, converted to open;  Surgeon: Sung Amabile, DO;  Location: [facility] ORS;  Service: General;  Laterality: N/A;  . COLONOSCOPY    . COLONOSCOPY WITH PROPOFOL N/A 05/09/2016   Procedure: COLONOSCOPY WITH PROPOFOL;  Surgeon: Scot Jun, MD;  Location: [facility] ENDOSCOPY;  Service: Endoscopy;  Laterality: N/A;  . CORONARY ANGIOPLASTY WITH STENT PLACEMENT     1 vessel  . ESOPHAGOGASTRODUODENOSCOPY (EGD) WITH PROPOFOL N/A 05/09/2016   Procedure: ESOPHAGOGASTRODUODENOSCOPY (EGD) WITH PROPOFOL;  Surgeon: Scot Jun, MD;  Location: [facility] ENDOSCOPY;   Service: Endoscopy;  Laterality: N/A;  . EYE SURGERY Right   . GLAUCOMA SURGERY    . LAPAROSCOPIC APPENDECTOMY N/A 07/17/2018   Procedure: APPENDECTOMY LAPAROSCOPIC;  Surgeon: Sung Amabile, DO;  Location: [facility] ORS;  Service: General;  Laterality: N/A;  . LEFT HEART CATH Right 10/19/2017   Procedure: Left Heart Cath and Coronary Angiography;  Surgeon: Laurier Nancy, MD;  Location: [facility] INVASIVE CV LAB;  Service: Cardiovascular;  Laterality: Right;  . NOSE SURGERY    . 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 VASECTOMY       Family History: Family History  Problem Relation Age of Onset  . Lung cancer Mother   . Hypertension Father   . Heart attack Father   . CAD Father   . Prostate cancer Neg Hx   . Bladder Cancer Neg Hx   . Kidney cancer Neg Hx      Social History: Social History   Socioeconomic History  . Marital status: Married    Spouse name: Not on file  . Number of children: Not on file  . Years of education: Not on file  . Highest education level: Not on file  Occupational History  . Not on file  Tobacco Use  . Smoking status: Former Smoker    Packs/day: 1.00    Types: Cigarettes    Quit  date: 2012    Years since quitting: 10.3  . Smokeless tobacco: Former Clinical biochemistUser  Vaping Use  . Vaping Use: Never used  Substance and Sexual Activity  . Alcohol use: No    Comment: no alcohol since 2010  . Drug use: No  . Sexual activity: Not on file  Other Topics Concern  . Not on file  Social History Narrative  . Not on file   Social Determinants of Health   Financial Resource Strain: Not on file  Food Insecurity: Not on file  Transportation Needs: Not on file  Physical Activity: Not on file  Stress: Not on file  Social Connections: Not on file  Intimate Partner Violence: Not on file     Review of Systems: Review of Systems  Constitutional: Positive for malaise/fatigue and weight loss.  HENT: Negative.   Eyes: Negative.   Respiratory: Negative for cough, hemoptysis,  sputum production, shortness of breath and wheezing.   Cardiovascular: Positive for chest pain. Negative for palpitations, orthopnea, claudication, leg swelling and PND.  Gastrointestinal: Negative.   Genitourinary: Negative for dysuria, flank pain, frequency, hematuria and urgency.  Musculoskeletal: Negative for back pain, falls, joint pain, myalgias and neck pain.  Skin: Negative.   Neurological: Negative.   Endo/Heme/Allergies: Negative.   Psychiatric/Behavioral: Negative.     Vital Signs: Blood pressure (!) 169/94, pulse 78, temperature 97.8 F (36.6 C), temperature source Oral, resp. rate 17, height 6' (1.829 m), weight 90.7 kg, SpO2 97 %.  Weight trends: Filed Weights   11/27/20 2021  Weight: 90.7 kg    Physical Exam: General: NAD, sitting up in bed, working with PT  Head: Normocephalic, atraumatic. Moist oral mucosal membranes  Eyes: Anicteric, PERRL  Neck: Supple, trachea midline  Lungs:  Clear to auscultation  Heart: Regular rate and rhythm  Abdomen:  Soft, nontender,   Extremities:  no peripheral edema.  Neurologic: Nonfocal, moving all four extremities  Skin: No lesions        Lab results: Basic Metabolic Panel: Recent Labs  Lab 11/27/20 2028 11/28/20 0553 11/29/20 0545  NA 123* 129* 126*  K 3.9 3.8 3.8  CL 90* 97* 96*  CO2 25 24 25   GLUCOSE 109* 95 105*  BUN 9 6* 6*  CREATININE 0.81 0.78 0.61  CALCIUM 8.7* 8.8* 8.3*  MG  --  1.7  --   PHOS  --  4.3  --     Liver Function Tests: Recent Labs  Lab 11/27/20 2028  AST 27  ALT 30  ALKPHOS 68  BILITOT 0.6  PROT 6.2*  ALBUMIN 3.6   No results for input(s): LIPASE, AMYLASE in the last 168 hours. No results for input(s): AMMONIA in the last 168 hours.  CBC: Recent Labs  Lab 11/27/20 2028 11/28/20 0553  WBC 8.1 6.8  NEUTROABS  --  4.3  HGB 12.7* 12.7*  HCT 36.9* 38.7*  MCV 84.4 83.9  PLT 320 270    Cardiac Enzymes: Recent Labs  Lab 11/27/20 2028  CKTOTAL 272    BNP: Invalid  input(s): POCBNP  CBG: No results for input(s): GLUCAP in the last 168 hours.  Microbiology: Results for orders placed or performed during the hospital encounter of 11/27/20  Resp Panel by RT-PCR (Flu A&B, Covid) Nasopharyngeal Swab     Status: None   Collection Time: 11/27/20 11:02 PM   Specimen: Nasopharyngeal Swab; Nasopharyngeal(NP) swabs in vial transport medium  Result Value Ref Range Status   SARS Coronavirus 2 by RT PCR NEGATIVE NEGATIVE  Final    Comment: (NOTE) SARS-CoV-2 target nucleic acids are NOT DETECTED.  The SARS-CoV-2 RNA is generally detectable in upper respiratory specimens during the acute phase of infection. The lowest concentration of SARS-CoV-2 viral copies this assay can detect is 138 copies/mL. A negative result does not preclude SARS-Cov-2 infection and should not be used as the sole basis for treatment or other patient management decisions. A negative result may occur with  improper specimen collection/handling, submission of specimen other than nasopharyngeal swab, presence of viral mutation(s) within the areas targeted by this assay, and inadequate number of viral copies(<138 copies/mL). A negative result must be combined with clinical observations, patient history, and epidemiological information. The expected result is Negative.  Fact Sheet for Patients:  BloggerCourse.com  Fact Sheet for Healthcare Providers:  SeriousBroker.it  This test is no t yet approved or cleared by the [place] FDA and  has been authorized for detection and/or diagnosis of SARS-CoV-2 by FDA under an Emergency Use Authorization (EUA). This EUA will remain  in effect (meaning this test can be used) for the duration of the COVID-19 declaration under Section 564(b)(1) of the Act, 21 U.S.C.section 360bbb-3(b)(1), unless the authorization is terminated  or revoked sooner.       Influenza A by PCR NEGATIVE NEGATIVE Final    Influenza B by PCR NEGATIVE NEGATIVE Final    Comment: (NOTE) The Xpert Xpress SARS-CoV-2/FLU/RSV plus assay is intended as an aid in the diagnosis of influenza from Nasopharyngeal swab specimens and should not be used as a sole basis for treatment. Nasal washings and aspirates are unacceptable for Xpert Xpress SARS-CoV-2/FLU/RSV testing.  Fact Sheet for Patients: BloggerCourse.com  Fact Sheet for Healthcare Providers: SeriousBroker.it  This test is not yet approved or cleared by the [place] FDA and has been authorized for detection and/or diagnosis of SARS-CoV-2 by FDA under an Emergency Use Authorization (EUA). This EUA will remain in effect (meaning this test can be used) for the duration of the COVID-19 declaration under Section 564(b)(1) of the Act, 21 U.S.C. section 360bbb-3(b)(1), unless the authorization is terminated or revoked.  Performed at [facility], [address] [place], [place] [address]     Coagulation Studies: No results for input(s): LABPROT, INR in the last 72 hours.  Urinalysis: Recent Labs    11/27/20 2302  COLORURINE YELLOW*  LABSPEC 1.010  PHURINE 5.0  GLUCOSEU NEGATIVE  HGBUR NEGATIVE  BILIRUBINUR NEGATIVE  KETONESUR NEGATIVE  PROTEINUR NEGATIVE  NITRITE NEGATIVE  LEUKOCYTESUR NEGATIVE      Imaging: DG Chest Portable 1 View  Result Date: 11/27/2020 CLINICAL DATA:  Weakness EXAM: PORTABLE CHEST 1 VIEW COMPARISON:  05/05/2020 FINDINGS: No focal opacity or pleural effusion. Stable cardiomediastinal silhouette with aortic atherosclerosis. No pneumothorax. IMPRESSION: No active disease. Electronically Signed   By: Jasmine Pang M.D.   On: 11/27/2020 20:57      Assessment & Plan: Mr. CYRIS MAALOUF is a 65 y.o. white male with schizophrenia, hypertension, hyperlipidemia, COPD, coronary artery disease, atrial fibrillation, BPH, EtOH abuse, history of seizures, toe  amputation, glaucoma, who was admitted to [facility] on 11/27/2020 for Acute hyponatremia [E87.1] Hyponatremia [E87.1] Weakness [R53.1]  1. Hyponatremia: with history of polydipsia. Not currently on diuretics. History suggestive of hypovolemia.  - Encourage PO intake - Consider sodium chloride tablets if no improvement.   2. Hypertension: urgency on admission. history of poor control. Home regimen of Entresto, metoprolol, olmesartan, carvedilol, isosorbide mononitrate, and tamsulosin.  - restarted tamsulosin, isosorbide mononitrate, and carvedilol.  -  Do not recommend restarting metoprolol.  - restart olmesartan today - Check echocardiogram     LOS: 2 Koji Niehoff 4/21/20229:14 AM

## 2020-11-29 NOTE — Progress Notes (Signed)
*  PRELIMINARY RESULTS* Echocardiogram 2D Echocardiogram has been performed.  Cristela Blue 11/29/2020, 12:59 PM

## 2020-11-29 NOTE — Evaluation (Signed)
Physical Therapy Evaluation Patient Details Name: Brandon [name] MRN: [id number] DOB: 02-14-1956 Today's Date: 11/29/2020   History of Present Illness  Patient is a 65 y.o. male with medical history significant for schizophrenia, hypertension, hyperlipidemia, COPD not on oxygen therapy, coronary disease status post stent and hypertension. He presented to the emergency room with complaints of feeling lightheaded and generally weak.  He also complained of muscle cramps. Found to have hyponatremia.    Clinical Impression  Patient is cooperative and agreeable to PT evaluation. Patient reports he uses a rolling walker at baseline due to generalized weakness and has had 2 falls at home in the last two weeks. Patient lives alone.   Patient complains of generally not feeling well and points to his chest (no pain reported), had just had breakfast. Orthotstatic vitals taken during session with supine BP 160/104, sitting BP 144/90 mmHg, and standing BP 105/30mmHg. Patient does complain of feeling dizzy with standing. Patient was able to stand x 2 bouts and reuqired minimal assistance for lifting from the bed. Pulse rate and Sp02 also monitored throughout with pulse 84-88bpm with activity and Sp02 in the high 90's with activity on room air. Patient does appear to have generalized weakness and would be unsafe to return home alone at this time. Recommend to continue PT to maximize independence and address functional limitations listed below. SNF is recommended at this time and patient states he is agreeable.      Follow Up Recommendations SNF    Equipment Recommendations  None recommended by PT    Recommendations for Other Services       Precautions / Restrictions Precautions Precautions: Fall Restrictions Weight Bearing Restrictions: No      Mobility  Bed Mobility Overal bed mobility: Modified Independent                  Transfers Overall transfer level: Needs assistance Equipment  used: Rolling walker (2 wheeled) Transfers: Sit to/from Stand           General transfer comment: 2 standing bouts performed from bed. Patient needs minimal assistance for lifitng and has decreased eccentric control noted. Patient does report mild dizziness during session. orthostatic vitals taken during session. supine 160/104, sitting 144/90, standing 105/76  Ambulation/Gait             General Gait Details: ambulation deferred due to positive orthostatic vitals, patient feeling mildly dizzy, and patient generally not feeling well  Stairs            Wheelchair Mobility    Modified [name] (Stroke Patients Only)       Balance Overall balance assessment: History of Falls;Needs assistance Sitting-balance support: Feet supported Sitting balance-[name] Scale: Good     Standing balance support: Bilateral upper extremity supported Standing balance-[name] Scale: Fair Standing balance comment: patient relying heavily on rolling walker for support in standing                             Pertinent Vitals/Pain Pain Assessment: No/denies pain    Home Living Family/patient expects to be discharged to:: Private residence Living Arrangements: Alone Available Help at Discharge: Family;Friend(s);Available PRN/intermittently Type of Home:  (lives in an apartment within a house by himself) Home Access: Stairs to enter Entrance Stairs-Rails: Left Entrance Stairs-Number of Steps: 5-6 Home Layout: One level Home Equipment: Walker - 2 wheels      Prior Function Level of Independence: Independent with assistive device(s)  Comments: Patient reports 2 falls in the past 2 weeks at home. Patient ambulates with a rolling walker outside the home. He reports independence with ADLs and that he sits down in the tub to when bathing. Patient does not drive and depends on family and friends for transportation     Hand Dominance        Extremity/Trunk Assessment    Upper Extremity Assessment Upper Extremity Assessment: Generalized weakness    Lower Extremity Assessment Lower Extremity Assessment: RLE deficits/detail;LLE deficits/detail RLE Deficits / Details: knee extension 4+/5, dorsiflexion 4/5. generalized weakness throughout LLE Deficits / Details: knee extension 4+/5, dorsiflexion 4/5. generalized weakness throughout       Communication   Communication: No difficulties  Cognition Arousal/Alertness: Awake/alert Behavior During Therapy: WFL for tasks assessed/performed Overall Cognitive Status: Within Functional Limits for tasks assessed                                        General Comments      Exercises     Assessment/Plan    PT Assessment Patient needs continued PT services  PT Problem List Decreased strength;Decreased activity tolerance;Decreased balance;Cardiopulmonary status limiting activity;Decreased mobility       PT Treatment Interventions Stair training;Gait training;DME instruction;Functional mobility training;Therapeutic activities;Therapeutic exercise;Balance training;Neuromuscular re-education    PT Goals (Current goals can be found in the Care Plan section)  Acute Rehab PT Goals Patient Stated Goal: to feel better PT Goal Formulation: With patient Time For Goal Achievement: 12/13/20 Potential to Achieve Goals: Good    Frequency Min 2X/week   Barriers to discharge Decreased caregiver support      Co-evaluation               AM-PAC PT "6 Clicks" Mobility  Outcome Measure Help needed turning from your back to your side while in a flat bed without using bedrails?: A Little Help needed moving from lying on your back to sitting on the side of a flat bed without using bedrails?: A Little Help needed moving to and from a bed to a chair (including a wheelchair)?: A Little Help needed standing up from a chair using your arms (e.g., wheelchair or bedside chair)?: A Little Help needed to walk  in hospital room?: A Lot Help needed climbing 3-5 steps with a railing? : A Lot 6 Click Score: 16    End of Session   Activity Tolerance: Patient limited by fatigue Patient left: in bed;with call bell/phone within reach;with bed alarm set Nurse Communication: Mobility status (vitals) PT Visit Diagnosis: History of falling (Z91.81);Muscle weakness (generalized) (M62.81)    Time: 0912-1000 PT Time Calculation (min) (ACUTE ONLY): 48 min   Charges:   PT Evaluation $PT Eval Moderate Complexity: 1 Mod PT Treatments $Therapeutic Activity: 8-22 mins        Donna Bernard, PT, MPT  Brandon [name] 11/29/2020, 11:17 AM

## 2020-11-29 NOTE — NC FL2 (Signed)
[place] MEDICAID FL2 LEVEL OF CARE SCREENING TOOL     IDENTIFICATION  Patient Name: Brandon [name] Birthdate: [birth year] Sex: male Admission Date (Current Location): 11/27/2020  [place] and [place] Number:  Chiropodist and Address:  [facility], [address], [place], [place] [address]      Provider Number: [id number]  Attending Physician Name and Address:  Enedina Finner, MD  Relative Name and Phone Number:  APS Legal Guardian Faison-[phone]    Current Level of Care: Hospital Recommended Level of Care: Skilled Nursing Facility Prior Approval Number:    Date Approved/Denied:   PASRR Number: [id number]  Discharge Plan: SNF    Current Diagnoses: Patient Active Problem List   Diagnosis Date Noted  . Weakness   . Chronic schizophrenia (HCC)   . Acute hyponatremia 11/27/2020  . Appendicitis with perforation 07/17/2018  . Acute cholecystitis 04/10/2018  . Chronic cholecystitis 04/08/2018  . Atherosclerosis of native arteries of extremity with intermittent claudication (HCC) 11/22/2017  . Primary hypertension 11/22/2017  . Hyperlipidemia 11/22/2017  . COPD (chronic obstructive pulmonary disease) (HCC) 11/22/2017  . CAD (coronary artery disease) 10/15/2017  . Other toe(s) amputation status 11/17/2014  . Schizoaffective disorder, bipolar type (HCC) 08/23/2008    Orientation RESPIRATION BLADDER Height & Weight     Self,Time,Situation  Normal Continent Weight: 90.7 kg Height:  6' (182.9 cm)  BEHAVIORAL SYMPTOMS/MOOD NEUROLOGICAL BOWEL NUTRITION STATUS      Continent Diet (Fluid consistency: Thin  Dietitian to order nutritional supplements as indicated per patient diet order and condition)  AMBULATORY STATUS COMMUNICATION OF NEEDS Skin   Limited Assist Verbally Bruising,Skin abrasions (Bilateral Arms)                       Personal Care Assistance Level of Assistance  Bathing,Feeding,Dressing Bathing Assistance:  Independent Feeding assistance: Independent Dressing Assistance: Independent     Functional Limitations Info  Sight,Hearing,Speech Sight Info: Adequate Hearing Info: Adequate Speech Info: Adequate    SPECIAL CARE FACTORS FREQUENCY  PT (By licensed PT),OT (By licensed OT)     PT Frequency: Min 5x weekly OT Frequency: Min 5x weekly            Contractures Contractures Info: Not present    Additional Factors Info  Code Status,Allergies Code Status Info: Full Code Allergies Info: Compazine-dystonic near fatal reaction  ,Ivp Dye (iodinated Diagnostic Agents) , Alcohol-sulfur (elemental Sulfur),  Benadryl (diphenhydramine), Depakote (divalproex Sodium), Dramamine (dimenhydrinate), Plavix (clopidogrel), Rosuvastatin           Current Medications (11/29/2020):  This is the current hospital active medication list Current Facility-Administered Medications  Medication Dose Route Frequency Provider Last Rate Last Admin  . acetaminophen (TYLENOL) tablet 650 mg  650 mg Oral Q6H PRN Acheampong, Genice Rouge, MD      . albuterol (PROVENTIL) (2.5 MG/3ML) 0.083% nebulizer solution 2.5 mg  2.5 mg Nebulization Q2H PRN Acheampong, Genice Rouge, MD      . alum & mag hydroxide-simeth (MAALOX/MYLANTA) 200-200-20 MG/5ML suspension 30 mL  30 mL Oral TID AC & HS Acheampong, Genice Rouge, MD   30 mL at 11/29/20 1248  . buPROPion [facility] SR) 12 hr tablet 100 mg  100 mg Oral Daily Acheampong, Genice Rouge, MD   100 mg at 11/29/20 0916  . carvedilol (COREG) tablet 25 mg  25 mg Oral BID WC Acheampong, Genice Rouge, MD   25 mg at 11/29/20 0917  . cloZAPine (CLOZARIL) tablet 200 mg  200 mg  Oral QHS Acheampong, Genice Rouge, MD   150 mg at 11/28/20 2122  . docusate sodium (COLACE) capsule 100 mg  100 mg Oral Daily Acheampong, Genice Rouge, MD   100 mg at 11/29/20 0917  . dorzolamide-timolol (COSOPT) 22.3-6.8 MG/ML ophthalmic solution 1 drop  1 drop Both Eyes BID Acheampong, Genice Rouge, MD   1 drop at 11/29/20 0915  . enoxaparin (LOVENOX)  injection 40 mg  40 mg Subcutaneous Q24H Acheampong, Genice Rouge, MD   40 mg at 11/29/20 9937  . ezetimibe (ZETIA) tablet 10 mg  10 mg Oral QHS Lilia Pro, MD   10 mg at 11/28/20 2122  . famotidine (PEPCID) tablet 20 mg  20 mg Oral BID Acheampong, Genice Rouge, MD   20 mg at 11/29/20 0917  . fenofibrate tablet 160 mg  160 mg Oral Daily Lilia Pro, MD   160 mg at 11/29/20 1696  . fluticasone (FLONASE) 50 MCG/ACT nasal spray 2 spray  2 spray Each Nare Daily Acheampong, Genice Rouge, MD   2 spray at 11/29/20 0915  . fluticasone furoate-vilanterol (BREO ELLIPTA) 100-25 MCG/INH 1 puff  1 puff Inhalation Daily Acheampong, Genice Rouge, MD   1 puff at 11/29/20 0917  . hydrALAZINE (APRESOLINE) injection 10 mg  10 mg Intravenous Q6H PRN Lilia Pro, MD   10 mg at 11/28/20 0529  . HYDROcodone-acetaminophen (NORCO/VICODIN) 5-325 MG per tablet 1 tablet  1 tablet Oral Q6H PRN Acheampong, Genice Rouge, MD      . hydrOXYzine (VISTARIL) capsule 25 mg  25 mg Oral BID PRN Acheampong, Genice Rouge, MD      . irbesartan (AVAPRO) tablet 300 mg  300 mg Oral Daily Kolluru, Sarath, MD   300 mg at 11/29/20 1323  . isosorbide mononitrate (IMDUR) 24 hr tablet 30 mg  30 mg Oral BID Lilia Pro, MD   30 mg at 11/29/20 0917  . latanoprost (XALATAN) 0.005 % ophthalmic solution 1 drop  1 drop Both Eyes QHS Acheampong, Genice Rouge, MD   1 drop at 11/28/20 2147  . nitroGLYCERIN (NITROSTAT) SL tablet 0.4 mg  0.4 mg Sublingual Q5 min PRN Acheampong, Genice Rouge, MD      . senna (SENOKOT) tablet 8.6-17.2 mg  1-2 tablet Oral Daily PRN Lilia Pro, MD      . simvastatin (ZOCOR) tablet 40 mg  40 mg Oral QHS Lilia Pro, MD   40 mg at 11/28/20 2121  . tamsulosin (FLOMAX) capsule 0.4 mg  0.4 mg Oral Daily Acheampong, Genice Rouge, MD   0.4 mg at 11/29/20 7893     Discharge Medications: Please see discharge summary for a list of discharge medications.  Relevant Imaging Results:  Relevant Lab Results:   Additional  Information SSN [id number]  Caryn Section, RN

## 2020-11-30 DIAGNOSIS — F209 Schizophrenia, unspecified: Secondary | ICD-10-CM | POA: Diagnosis not present

## 2020-11-30 DIAGNOSIS — I10 Essential (primary) hypertension: Secondary | ICD-10-CM | POA: Diagnosis not present

## 2020-11-30 DIAGNOSIS — E871 Hypo-osmolality and hyponatremia: Secondary | ICD-10-CM | POA: Diagnosis not present

## 2020-11-30 DIAGNOSIS — R531 Weakness: Secondary | ICD-10-CM | POA: Diagnosis not present

## 2020-11-30 LAB — BASIC METABOLIC PANEL
Anion gap: 8 (ref 5–15)
BUN: 6 mg/dL — ABNORMAL LOW (ref 8–23)
CO2: 24 mmol/L (ref 22–32)
Calcium: 8.4 mg/dL — ABNORMAL LOW (ref 8.9–10.3)
Chloride: 86 mmol/L — ABNORMAL LOW (ref 98–111)
Creatinine, Ser: 0.59 mg/dL — ABNORMAL LOW (ref 0.61–1.24)
GFR, Estimated: >60 mL/min (ref >60)
Glucose, Bld: 109 mg/dL — ABNORMAL HIGH (ref 70–99)
Potassium: 3.6 mmol/L (ref 3.5–5.1)
Sodium: 118 mmol/L — CL (ref 135–145)

## 2020-11-30 LAB — SODIUM
Sodium: 122 mmol/L — ABNORMAL LOW (ref 135–145)
Sodium: 122 mmol/L — ABNORMAL LOW (ref 135–145)

## 2020-11-30 LAB — ECHOCARDIOGRAM COMPLETE
AR max vel: 2.79 cm2
AV Area VTI: 3.18 cm2
AV Area mean vel: 2.61 cm2
AV Mean grad: 2 mmHg
AV Peak grad: 4.4 mmHg
Ao pk vel: 1.05 m/s
Area-P 1/2: 3.58 cm2
Height: 72 in
S' Lateral: 3.19 cm
Weight: 3200 oz

## 2020-11-30 LAB — HEPATIC FUNCTION PANEL
ALT: 26 U/L (ref 0–44)
AST: 23 U/L (ref 15–41)
Albumin: 3.2 g/dL — ABNORMAL LOW (ref 3.5–5.0)
Alkaline Phosphatase: 64 U/L (ref 38–126)
Bilirubin, Direct: 0.2 mg/dL (ref 0.0–0.2)
Indirect Bilirubin: 0.6 mg/dL (ref 0.3–0.9)
Total Bilirubin: 0.8 mg/dL (ref 0.3–1.2)
Total Protein: 5.8 g/dL — ABNORMAL LOW (ref 6.5–8.1)

## 2020-11-30 MED ORDER — SODIUM CHLORIDE 1 G PO TABS
1.0000 g | ORAL_TABLET | Freq: Three times a day (TID) | ORAL | Status: DC
  Administered 2020-11-30 – 2020-12-03 (×10): 1 g via ORAL
  Filled 2020-11-30 (×14): qty 1

## 2020-11-30 MED ORDER — TOLVAPTAN 15 MG PO TABS
15.0000 mg | ORAL_TABLET | Freq: Once | ORAL | Status: AC
  Administered 2020-11-30: 15 mg via ORAL
  Filled 2020-11-30: qty 1

## 2020-11-30 NOTE — Care Management Important Message (Signed)
Important Message  Patient Details  Name: Brandon [name] MRN: [id number] Date of Birth: 1955-12-29   Medicare Important Message Given:  Yes     Brandon [name] [name] 11/30/2020, 1:01 PM

## 2020-11-30 NOTE — Progress Notes (Addendum)
[facility]  ROUNDING NOTE   Subjective:   Brandon [name] is a 65 y.o. male with past medical history of hypertension, hyperlipidemia, COPD, schizophrenia and coronary disease post stent. He presents to the ED with complaints of weakness and being lightheaded.   We have been consulted for hyponatremia. On admission, his sodium level was 135. It has steadily declined during this admission. Patient seen resting in bed. States he feels tired this morning. Continue to void and states he finally had a BM this morning. Continues to have poor appetite.   Objective:  Vital signs in last 24 hours:  Temp:  [97.6 F (36.4 C)-98.5 F (36.9 C)] 97.8 F (36.6 C) (04/22 0539) Pulse Rate:  [73-92] 92 (04/22 0726) Resp:  [15-20] 18 (04/22 0726) BP: (115-184)/(78-102) 169/98 (04/22 0726) SpO2:  [95 %-100 %] 98 % (04/22 0726)  Weight change:  Filed Weights   11/27/20 2021  Weight: 90.7 kg    Intake/Output: I/O last 3 completed shifts: In: 1052.1 [I.V.:1052.1] Out: 3325 [Urine:3325]   Intake/Output this shift:  Total I/O In: -  Out: 325 [Urine:325]  Physical Exam: General: NAD, resting in bed  Head: Normocephalic, atraumatic. Moist oral mucosal membranes  Eyes: Anicteric  Lungs:  Clear to auscultation  Heart: Regular rate and rhythm  Abdomen:  Soft, nontender,   Extremities:  no peripheral edema.  Neurologic: Nonfocal, moving all four extremities  Skin: No lesions       Basic Metabolic Panel: Recent Labs  Lab 11/27/20 2028 11/28/20 0553 11/29/20 0545 11/30/20 0342  NA 123* 129* 126* 122*  K 3.9 3.8 3.8  --   CL 90* 97* 96*  --   CO2 25 24 25   --   GLUCOSE 109* 95 105*  --   BUN 9 6* 6*  --   CREATININE 0.81 0.78 0.61  --   CALCIUM 8.7* 8.8* 8.3*  --   MG  --  1.7  --   --   PHOS  --  4.3  --   --     Liver Function Tests: Recent Labs  Lab 11/27/20 2028  AST 27  ALT 30  ALKPHOS 68  BILITOT 0.6  PROT 6.2*  ALBUMIN 3.6   No results for input(s):  LIPASE, AMYLASE in the last 168 hours. No results for input(s): AMMONIA in the last 168 hours.  CBC: Recent Labs  Lab 11/27/20 2028 11/28/20 0553  WBC 8.1 6.8  NEUTROABS  --  4.3  HGB 12.7* 12.7*  HCT 36.9* 38.7*  MCV 84.4 83.9  PLT 320 270    Cardiac Enzymes: Recent Labs  Lab 11/27/20 2028  CKTOTAL 272    BNP: Invalid input(s): POCBNP  CBG: No results for input(s): GLUCAP in the last 168 hours.  Microbiology: Results for orders placed or performed during the hospital encounter of 11/27/20  Resp Panel by RT-PCR (Flu A&B, Covid) Nasopharyngeal Swab     Status: None   Collection Time: 11/27/20 11:02 PM   Specimen: Nasopharyngeal Swab; Nasopharyngeal(NP) swabs in vial transport medium  Result Value Ref Range Status   SARS Coronavirus 2 by RT PCR NEGATIVE NEGATIVE Final    Comment: (NOTE) SARS-CoV-2 target nucleic acids are NOT DETECTED.  The SARS-CoV-2 RNA is generally detectable in upper respiratory specimens during the acute phase of infection. The lowest concentration of SARS-CoV-2 viral copies this assay can detect is 138 copies/mL. A negative result does not preclude SARS-Cov-2 infection and should not be used as the sole basis for  treatment or other patient management decisions. A negative result may occur with  improper specimen collection/handling, submission of specimen other than nasopharyngeal swab, presence of viral mutation(s) within the areas targeted by this assay, and inadequate number of viral copies(<138 copies/mL). A negative result must be combined with clinical observations, patient history, and epidemiological information. The expected result is Negative.  Fact Sheet for Patients:  BloggerCourse.com  Fact Sheet for Healthcare Providers:  SeriousBroker.it  This test is no t yet approved or cleared by the [place] FDA and  has been authorized for detection and/or diagnosis of SARS-CoV-2  by FDA under an Emergency Use Authorization (EUA). This EUA will remain  in effect (meaning this test can be used) for the duration of the COVID-19 declaration under Section 564(b)(1) of the Act, 21 U.S.C.section 360bbb-3(b)(1), unless the authorization is terminated  or revoked sooner.       Influenza A by PCR NEGATIVE NEGATIVE Final   Influenza B by PCR NEGATIVE NEGATIVE Final    Comment: (NOTE) The Xpert Xpress SARS-CoV-2/FLU/RSV plus assay is intended as an aid in the diagnosis of influenza from Nasopharyngeal swab specimens and should not be used as a sole basis for treatment. Nasal washings and aspirates are unacceptable for Xpert Xpress SARS-CoV-2/FLU/RSV testing.  Fact Sheet for Patients: BloggerCourse.com  Fact Sheet for Healthcare Providers: SeriousBroker.it  This test is not yet approved or cleared by the [place] FDA and has been authorized for detection and/or diagnosis of SARS-CoV-2 by FDA under an Emergency Use Authorization (EUA). This EUA will remain in effect (meaning this test can be used) for the duration of the COVID-19 declaration under Section 564(b)(1) of the Act, 21 U.S.C. section 360bbb-3(b)(1), unless the authorization is terminated or revoked.  Performed at [facility], [address] [place], [place] [address]     Coagulation Studies: No results for input(s): LABPROT, INR in the last 72 hours.  Urinalysis: Recent Labs    11/27/20 2302  COLORURINE YELLOW*  LABSPEC 1.010  PHURINE 5.0  GLUCOSEU NEGATIVE  HGBUR NEGATIVE  BILIRUBINUR NEGATIVE  KETONESUR NEGATIVE  PROTEINUR NEGATIVE  NITRITE NEGATIVE  LEUKOCYTESUR NEGATIVE      Imaging: No results found.   Medications:    . alum & mag hydroxide-simeth  30 mL Oral TID AC & HS  . buPROPion  100 mg Oral Daily  . carvedilol  25 mg Oral BID WC  . cloZAPine  200 mg Oral QHS  . docusate sodium  100 mg Oral Daily  .  dorzolamide-timolol  1 drop Both Eyes BID  . enoxaparin (LOVENOX) injection  40 mg Subcutaneous Q24H  . ezetimibe  10 mg Oral QHS  . famotidine  20 mg Oral BID  . fenofibrate  160 mg Oral Daily  . fluticasone  2 spray Each Nare Daily  . fluticasone furoate-vilanterol  1 puff Inhalation Daily  . irbesartan  300 mg Oral Daily  . isosorbide mononitrate  30 mg Oral BID  . latanoprost  1 drop Both Eyes QHS  . simvastatin  40 mg Oral QHS  . sodium chloride  1 g Oral TID WC  . tamsulosin  0.4 mg Oral Daily  . tolvaptan  15 mg Oral Once   acetaminophen, albuterol, hydrALAZINE, HYDROcodone-acetaminophen, hydrOXYzine, nitroGLYCERIN, senna  Assessment/ Plan:  Mr. Brandon [name] is a 65 y.o.  male with past medical history of hypertension, hyperlipidemia, COPD, schizophrenia and coronary disease post stent.   1. Hyponatremia with history of polydipsia - Currently 122, re-check  for 118 - Encourage po intake - Adequate urine output - Tolvaptan  - Monitor sodium levels  Lab Results  Component Value Date   CREATININE 0.61 11/29/2020   CREATININE 0.78 11/28/2020   CREATININE 0.81 11/27/2020    Intake/Output Summary (Last 24 hours) at 11/30/2020 1007 Last data filed at 11/30/2020 0835 Gross per 24 hour  Intake --  Output 3050 ml  Net -3050 ml   2. Hypertension: urgency on admission, poor control history - home regimen includes Entresto, metoprolol, ol,esartan, carvedilol, isosorbide mononitrate and tamsulosin.  - continue holding metoprolol.  - ECHO- pending    LOS: 3   4/22/202210:07 AM

## 2020-11-30 NOTE — Plan of Care (Signed)
  Problem: Health Behavior/Discharge Planning: Goal: Ability to manage health-related needs will improve Outcome: Progressing   

## 2020-11-30 NOTE — Consult Note (Signed)
MEDICATION RELATED CONSULT NOTE - INITIAL   Pharmacy Consult for tolvaptan per pharmacy monitoring Indication: Hyponatremia  Allergies  Allergen Reactions  . Compazine [Prochlorperazine] Other (See Comments)    Dystonic rxn - convulsions. Near fatal reaction.   Ardine Bjork [Iodinated Diagnostic Agents] Shortness Of Breath    Pt states SOB after last IV contrast injection  . Alcohol-Sulfur [Elemental Sulfur] Other (See Comments)    History of alcoholism  . Benadryl [Diphenhydramine] Other (See Comments)    "stuffy", nasal congestion  . Depakote [Divalproex Sodium] Other (See Comments)    Cause elevated ammonia  . Dramamine [Dimenhydrinate] Swelling  . Plavix [Clopidogrel] Other (See Comments)    Rectal bleeding. "Perforated my intestines."  . Rosuvastatin     Other reaction(s): Other (See Comments)    Patient Measurements: Height: 6' (182.9 cm) Weight: 90.7 kg (200 lb) IBW/kg (Calculated) : 77.6  Vital Signs: Temp: 97.8 F (36.6 C) (04/22 1545) Temp Source: Oral (04/22 1545) BP: 121/91 (04/22 1545) Pulse Rate: 81 (04/22 1545) Intake/Output from previous day: 04/21 0701 - 04/22 0700 In: -  Out: 2725 [Urine:2725] Intake/Output from this shift: No intake/output data recorded.  Labs: Recent Labs    11/27/20 2028 11/28/20 0553 11/29/20 0545 11/30/20 1020  WBC 8.1 6.8  --   --   HGB 12.7* 12.7*  --   --   HCT 36.9* 38.7*  --   --   PLT 320 270  --   --   CREATININE 0.81 0.78 0.61 0.59*  MG  --  1.7  --   --   PHOS  --  4.3  --   --   ALBUMIN 3.6  --   --  3.2*  PROT 6.2*  --   --  5.8*  AST 27  --   --  23  ALT 30  --   --  26  ALKPHOS 68  --   --  64  BILITOT 0.6  --   --  0.8  BILIDIR  --   --   --  0.2  IBILI  --   --   --  0.6   Estimated Creatinine Clearance: 102.4 mL/min (A) (by C-G formula based on SCr of 0.59 mg/dL (L)).  Medical History: Past Medical History:  Diagnosis Date  . Anemia    IDA  . Colon polyps   . COPD (chronic obstructive  pulmonary disease) (HCC)   . Coronary artery disease   . Heart attack (HCC) 2005  . Hiatal hernia   . History of ETOH abuse   . Hyperlipidemia   . Hypertension   . Pleurisy   . Schizophrenia (HCC)   . Schizophrenia (HCC)   . Seizures (HCC)    grand mal in 2000 or 2001 recovery from alcoholism  . Suicide attempt [facility])      Assessment: 65 y.o.malepresented to the emergency room with complaintsof feeling lightheaded and generally weak was found to have a sodium level of 123. Hyponatremia likely multifactorial, thought to be hypovolemic hyponatremia but clozapine may be contributing. Sodium improved to 126 with IVF, back down to 122 today. Pharmacy has been consulted for tolvaptan monitoring.  LFTs WNL Na 123>129>126>122 > 118>122  Goal of Therapy:  Sodium WNL  Plan:  Tolvaptan 15 mg x1 ordered  Check baseline serum sodium, followed by every 8 hours x2   Alert MD if >8 mEq/L increase in serum sodium in 8 hours  Discontinue if serum sodium rises >12 mEq/L within 24  hours Monitor LFTs  if tolvaptan continued for more than 3 doses  Sharen Hones, PharmD 11/30/2020,7:23 PM

## 2020-11-30 NOTE — TOC Progression Note (Signed)
Transition of Care [facility]) - Progression Note    Patient Details  Name: Brandon [name] MRN: [id number] Date of Birth: 03-15-1956  [facility] of [facility]) CM/SW Contact  Barrie Dunker, RN Phone Number: 11/30/2020, 3:20 PM  Clinical Narrative:    SW faison and the patient agreed on the Bed offer with [facility], alerted Tammy at [facility], [employer] obtained Ref number [id number] Berkley Harvey number  [id number],  Start date 4/22 next review date 4/26 Notified Tammy at [facility] and the Physician        Expected Discharge Plan and Services                                                 Social Determinants of Health (SDOH) Interventions    Readmission Risk Interventions No flowsheet data found.

## 2020-11-30 NOTE — Progress Notes (Signed)
Pt complained of feeling like his heart is "racing". HR 81, BP 184/102. Prn hydralazine given. Steward Drone Morison/Np notified. Will cont to monitor pt.

## 2020-11-30 NOTE — Progress Notes (Signed)
Lab called with critical lab value.  Pts repeat sodium 118.  Dr. Allena Katz on the floor, reported value to her in person.  She is speaking with Dr. Wynelle Link at this time.  Samsca was given recently and 1gm sodium tablet will be given with lunch.  Pt resting in bed has had c/o of cramping in his right arm.

## 2020-11-30 NOTE — Progress Notes (Signed)
[facility]  - [place] at [facility]   PATIENT NAME: Brandon [name]    MR#:  [id number]  DATE OF BIRTH:  March 17, 1956  SUBJECTIVE:   Weakness, eating a bit better today  REVIEW OF SYSTEMS:   Review of Systems  Constitutional: Negative for chills, fever and weight loss.  HENT: Negative for ear discharge, ear pain and nosebleeds.   Eyes: Negative for blurred vision, pain and discharge.  Respiratory: Negative for sputum production, shortness of breath, wheezing and stridor.   Cardiovascular: Negative for chest pain, palpitations, orthopnea and PND.  Gastrointestinal: Negative for abdominal pain, diarrhea, nausea and vomiting.  Genitourinary: Negative for frequency and urgency.  Musculoskeletal: Negative for back pain and joint pain.  Neurological: Positive for weakness. Negative for sensory change, speech change and focal weakness.  Psychiatric/Behavioral: Negative for depression and hallucinations. The patient is not nervous/anxious.    Tolerating Diet:yes Tolerating PT: pending  DRUG ALLERGIES:   Allergies  Allergen Reactions  . Compazine [Prochlorperazine] Other (See Comments)    Dystonic rxn - convulsions. Near fatal reaction.   Ardine Bjork [Iodinated Diagnostic Agents] Shortness Of Breath    Pt states SOB after last IV contrast injection  . Alcohol-Sulfur [Elemental Sulfur] Other (See Comments)    History of alcoholism  . Benadryl [Diphenhydramine] Other (See Comments)    "stuffy", nasal congestion  . Depakote [Divalproex Sodium] Other (See Comments)    Cause elevated ammonia  . Dramamine [Dimenhydrinate] Swelling  . Plavix [Clopidogrel] Other (See Comments)    Rectal bleeding. "Perforated my intestines."  . Rosuvastatin     Other reaction(s): Other (See Comments)    VITALS:  Blood pressure (!) 169/98, pulse 92, temperature 97.8 F (36.6 C), temperature source Oral, resp. rate 18, height 6' (1.829 m), weight 90.7 kg, SpO2 98 %.  PHYSICAL EXAMINATION:    Physical Exam  GENERAL:  65 y.o.-year-old patient lying in the bed with no acute distress.  LUNGS: Normal breath sounds bilaterally, no wheezing, rales, rhonchi. No use of accessory muscles of respiration.  CARDIOVASCULAR: S1, S2 normal. No murmurs, rubs, or gallops.  ABDOMEN: Soft, nontender, nondistended. Bowel sounds present. No organomegaly or mass.  EXTREMITIES: No cyanosis, clubbing or edema b/l.    NEUROLOGIC: Cranial nerves II through XII are intact. No focal Motor or sensory deficits b/l.   PSYCHIATRIC:  patient is alert and oriented x 3.  SKIN: No obvious rash, lesion, or ulcer.   LABORATORY PANEL:  CBC Recent Labs  Lab 11/28/20 0553  WBC 6.8  HGB 12.7*  HCT 38.7*  PLT 270    Chemistries  Recent Labs  Lab 11/28/20 0553 11/29/20 0545 11/30/20 1020  NA 129*   < > 118*  K 3.8   < > 3.6  CL 97*   < > 86*  CO2 24   < > 24  GLUCOSE 95   < > 109*  BUN 6*   < > 6*  CREATININE 0.78   < > 0.59*  CALCIUM 8.8*   < > 8.4*  MG 1.7  --   --   AST  --   --  23  ALT  --   --  26  ALKPHOS  --   --  64  BILITOT  --   --  0.8   < > = values in this interval not displayed.   Cardiac Enzymes No results for input(s): TROPONINI in the last 168 hours. RADIOLOGY:  No results found. ASSESSMENT AND PLAN:  Brandon [name]  Brandon [name] is an 65 y.o. male with medical history significant for schizophrenia, hypertension, hyperlipidemia, COPD not on oxygen therapy, coronary disease status post stent and hypertension. He presented to the emergency room with complaints of feeling lightheaded and generally weak.  He also complained of muscle cramps.  Hyponatremia: Multifactorial.  -- Dehydration, poor po intake and hypovolemia most likely. ?psych meds --came in with Sodium of 123--129--126--stopped ivf--122--118--Tolvaptan --eating ok, stable mentation --good uop -- nephrology consultation with Dr Welford Roche and salt tabs -- Patient has had history of hyponatremia in the  past  Accelerated hypertension: Blood pressure stable.  -Resume home meds. PRN Hydralazine for optimization of BP.  Chronic Schizophrenia: Chronic and controlled on antipsychotics.   CAD: Status post stent in the past.  Patient is asymptomatic on this admission.  Cardiac markers negative.  CPK levels of 242 and unremarkable.  Afib : History of paroxysmal atrial fibrillation.  Patient currently in sinus rhythm.  Continue with home medications.  Chronic anemia:  hgb stable  BPH: Positive urinary symptoms.  Patient reports currently not on Flomax at home.  Med rec suggests previous prescription for Flomax.  This will be resumed.  Urology follow-up as outpatient. --making good Urine after receiving IVF  Chronic knee arthritis:  Knee braces in place. Analgesics PRN  Family communication :none Consults :none CODE STATUS: full DVT Prophylaxis :lovenox Level of care: Med-Surg Status is: Inpatient  Remains inpatient appropriate because:Inpatient level of care appropriate due to severity of illness  Patient continues to have low sodium. He is otherwise asymptomatic. Nephrology consultation placed PT yet to see patient.  Dispo: The patient is from: Home              Anticipated d/c is GB:TDVVO              Patient currently is not medically stable to d/c.   Difficult to place patient No  Patient's sodium continues to decline. Continue management per nephrology recommendation. Patient will discharge to rehab once stable      TOTAL TIME TAKING CARE OF THIS PATIENT: 25 minutes.  >50% time spent on counselling and coordination of care  Note: This dictation was prepared with Dragon dictation along with smaller phrase technology. Any transcriptional errors that result from this process are unintentional.  Brandon [name] M.D    [facility]   CC: Primary care physician; Brandon [name], MDPatient ID: Brandon [name], male   DOB: Apr 11, 1956, 65 y.o.   MRN: [id number]

## 2020-11-30 NOTE — Consult Note (Signed)
MEDICATION RELATED CONSULT NOTE - INITIAL   Pharmacy Consult for tolvaptan per pharmacy monitoring Indication: Hyponatremia  Allergies  Allergen Reactions  . Compazine [Prochlorperazine] Other (See Comments)    Dystonic rxn - convulsions. Near fatal reaction.   Ardine Bjork [Iodinated Diagnostic Agents] Shortness Of Breath    Pt states SOB after last IV contrast injection  . Alcohol-Sulfur [Elemental Sulfur] Other (See Comments)    History of alcoholism  . Benadryl [Diphenhydramine] Other (See Comments)    "stuffy", nasal congestion  . Depakote [Divalproex Sodium] Other (See Comments)    Cause elevated ammonia  . Dramamine [Dimenhydrinate] Swelling  . Plavix [Clopidogrel] Other (See Comments)    Rectal bleeding. "Perforated my intestines."  . Rosuvastatin     Other reaction(s): Other (See Comments)    Patient Measurements: Height: 6' (182.9 cm) Weight: 90.7 kg (200 lb) IBW/kg (Calculated) : 77.6  Vital Signs: Temp: 97.8 F (36.6 C) (04/22 0539) Temp Source: Oral (04/22 0539) BP: 169/98 (04/22 0726) Pulse Rate: 92 (04/22 0726) Intake/Output from previous day: 04/21 0701 - 04/22 0700 In: -  Out: 2725 [Urine:2725] Intake/Output from this shift: Total I/O In: -  Out: 325 [Urine:325]  Labs: Recent Labs    11/27/20 2028 11/28/20 0553 11/29/20 0545  WBC 8.1 6.8  --   HGB 12.7* 12.7*  --   HCT 36.9* 38.7*  --   PLT 320 270  --   CREATININE 0.81 0.78 0.61  MG  --  1.7  --   PHOS  --  4.3  --   ALBUMIN 3.6  --   --   PROT 6.2*  --   --   AST 27  --   --   ALT 30  --   --   ALKPHOS 68  --   --   BILITOT 0.6  --   --    Estimated Creatinine Clearance: 102.4 mL/min (by C-G formula based on SCr of 0.61 mg/dL).  Medical History: Past Medical History:  Diagnosis Date  . Anemia    IDA  . Colon polyps   . COPD (chronic obstructive pulmonary disease) (HCC)   . Coronary artery disease   . Heart attack (HCC) 2005  . Hiatal hernia   . History of ETOH abuse   .  Hyperlipidemia   . Hypertension   . Pleurisy   . Schizophrenia (HCC)   . Schizophrenia (HCC)   . Seizures (HCC)    grand mal in 2000 or 2001 recovery from alcoholism  . Suicide attempt [facility])      Assessment: 65 y.o.malepresented to the emergency room with complaintsof feeling lightheaded and generally weak was found to have a sodium level of 123. Hyponatremia likely multifactorial, thought to be hypovolemic hyponatremia but clozapine may be contributing. Sodium improved to 126 with IVF, back down to 122 today. Pharmacy has been consulted for tolvaptan monitoring.  LFTs WNL Na 123>129>126>122  Goal of Therapy:  Sodium WNL  Plan:  Tolvaptan 15 mg x1 ordered  Check baseline serum sodium, followed by every 8 hours x2   Alert MD if >8 mEq/L increase in serum sodium in 8 hours  Discontinue if serum sodium rises >12 mEq/L within 24 hours Monitor LFTs  if tolvaptan continued for more than 3 doses  Derrek Gu, PharmD 11/30/2020,10:08 AM

## 2020-12-01 DIAGNOSIS — F209 Schizophrenia, unspecified: Secondary | ICD-10-CM | POA: Diagnosis not present

## 2020-12-01 DIAGNOSIS — E871 Hypo-osmolality and hyponatremia: Secondary | ICD-10-CM | POA: Diagnosis not present

## 2020-12-01 DIAGNOSIS — R531 Weakness: Secondary | ICD-10-CM | POA: Diagnosis not present

## 2020-12-01 LAB — CBC
HCT: 35 % — ABNORMAL LOW (ref 39.0–52.0)
Hemoglobin: 11.6 g/dL — ABNORMAL LOW (ref 13.0–17.0)
MCH: 27.9 pg (ref 26.0–34.0)
MCHC: 33.1 g/dL (ref 30.0–36.0)
MCV: 84.1 fL (ref 80.0–100.0)
Platelets: 270 10*3/uL (ref 150–400)
RBC: 4.16 MIL/uL — ABNORMAL LOW (ref 4.22–5.81)
RDW: 13.5 % (ref 11.5–15.5)
WBC: 7.4 10*3/uL (ref 4.0–10.5)
nRBC: 0 % (ref 0.0–0.2)

## 2020-12-01 LAB — SODIUM: Sodium: 125 mmol/L — ABNORMAL LOW (ref 135–145)

## 2020-12-01 LAB — GLUCOSE, CAPILLARY: Glucose-Capillary: 183 mg/dL — ABNORMAL HIGH (ref 70–99)

## 2020-12-01 MED ORDER — SODIUM CHLORIDE 0.9 % IV SOLN: INTRAVENOUS | Status: DC

## 2020-12-01 MED ORDER — CARVEDILOL 3.125 MG PO TABS
6.2500 mg | ORAL_TABLET | Freq: Two times a day (BID) | ORAL | Status: DC
  Administered 2020-12-02 – 2020-12-03 (×3): 6.25 mg via ORAL
  Filled 2020-12-01 (×3): qty 2

## 2020-12-01 MED ORDER — SODIUM CHLORIDE 0.9 % IV BOLUS
250.0000 mL | Freq: Once | INTRAVENOUS | Status: AC
  Administered 2020-12-01: 250 mL via INTRAVENOUS

## 2020-12-01 MED ORDER — SODIUM CHLORIDE 0.9 % IV BOLUS: 1000.0000 mL | Freq: Once | INTRAVENOUS | Status: DC

## 2020-12-01 MED ORDER — IRBESARTAN 150 MG PO TABS
75.0000 mg | ORAL_TABLET | Freq: Every day | ORAL | Status: DC
  Administered 2020-12-02 – 2020-12-03 (×2): 75 mg via ORAL
  Filled 2020-12-01 (×2): qty 1

## 2020-12-01 NOTE — Plan of Care (Signed)
  Problem: Education: Goal: Knowledge of General Education information will improve Description Including pain rating scale, medication(s)/side effects and non-pharmacologic comfort measures Outcome: Progressing   

## 2020-12-01 NOTE — Progress Notes (Signed)
[facility]  -  at [facility]   PATIENT NAME: Brandon [name]    MR#:  [id number]  DATE OF BIRTH:  1956/07/25  SUBJECTIVE:   Patient got out and said to the chair with my help. But later his blood pressure dropped 55 systolic. He received a liter of bolus and now much better alert and oriented REVIEW OF SYSTEMS:   Review of Systems  Constitutional: Negative for chills, fever and weight loss.  HENT: Negative for ear discharge, ear pain and nosebleeds.   Eyes: Negative for blurred vision, pain and discharge.  Respiratory: Negative for sputum production, shortness of breath, wheezing and stridor.   Cardiovascular: Negative for chest pain, palpitations, orthopnea and PND.  Gastrointestinal: Negative for abdominal pain, diarrhea, nausea and vomiting.  Genitourinary: Negative for frequency and urgency.  Musculoskeletal: Negative for back pain and joint pain.  Neurological: Positive for weakness. Negative for sensory change, speech change and focal weakness.  Psychiatric/Behavioral: Negative for depression and hallucinations. The patient is not nervous/anxious.    Tolerating Diet:yes Tolerating PT: rehab  DRUG ALLERGIES:   Allergies  Allergen Reactions  . Compazine [Prochlorperazine] Other (See Comments)    Dystonic rxn - convulsions. Near fatal reaction.   Ardine Bjork [Iodinated Diagnostic Agents] Shortness Of Breath    Pt states SOB after last IV contrast injection  . Alcohol-Sulfur [Elemental Sulfur] Other (See Comments)    History of alcoholism  . Benadryl [Diphenhydramine] Other (See Comments)    "stuffy", nasal congestion  . Depakote [Divalproex Sodium] Other (See Comments)    Cause elevated ammonia  . Dramamine [Dimenhydrinate] Swelling  . Plavix [Clopidogrel] Other (See Comments)    Rectal bleeding. "Perforated my intestines."  . Rosuvastatin     Other reaction(s): Other (See Comments)    VITALS:  Blood pressure 108/83, pulse 81, temperature (!)  97.4 F (36.3 C), temperature source Oral, resp. rate (!) 22, height 6' (1.829 m), weight 90.7 kg, SpO2 99 %.  PHYSICAL EXAMINATION:   Physical Exam  GENERAL:  65 y.o.-year-old patient lying in the bed with no acute distress.  LUNGS: Normal breath sounds bilaterally, no wheezing, rales, rhonchi. No use of accessory muscles of respiration.  CARDIOVASCULAR: S1, S2 normal. No murmurs, rubs, or gallops.  ABDOMEN: Soft, nontender, nondistended. Bowel sounds present. No organomegaly or mass.  EXTREMITIES: No cyanosis, clubbing or edema b/l.    NEUROLOGIC: Cranial nerves II through XII are intact. No focal Motor or sensory deficits b/l.   PSYCHIATRIC:  patient is alert and oriented x 3.  SKIN: No obvious rash, lesion, or ulcer.   LABORATORY PANEL:  CBC Recent Labs  Lab 12/01/20 1040  WBC 7.4  HGB 11.6*  HCT 35.0*  PLT 270    Chemistries  Recent Labs  Lab 11/28/20 0553 11/29/20 0545 11/30/20 1020 11/30/20 1750 12/01/20 0126  NA 129*   < > 118*   < > 125*  K 3.8   < > 3.6  --   --   CL 97*   < > 86*  --   --   CO2 24   < > 24  --   --   GLUCOSE 95   < > 109*  --   --   BUN 6*   < > 6*  --   --   CREATININE 0.78   < > 0.59*  --   --   CALCIUM 8.8*   < > 8.4*  --   --   MG 1.7  --   --   --   --  AST  --   --  23  --   --   ALT  --   --  26  --   --   ALKPHOS  --   --  64  --   --   BILITOT  --   --  0.8  --   --    < > = values in this interval not displayed.   Cardiac Enzymes No results for input(s): TROPONINI in the last 168 hours. RADIOLOGY:  No results found. ASSESSMENT AND PLAN:  Brandon [name] is an 65 y.o. male with medical history significant for schizophrenia, hypertension, hyperlipidemia, COPD not on oxygen therapy, coronary disease status post stent and hypertension. He presented to the emergency room with complaints of feeling lightheaded and generally weak.  He also complained of muscle cramps.  Hyponatremia: Multifactorial.  -- Dehydration, poor po  intake and hypovolemia most likely. ?psych meds --came in with Sodium of 123--129--126--stopped ivf--122--118--Tolvaptan --eating ok, stable mentation --good uop -- nephrology consultation with Dr Welford Roche and salt tabs -- Patient has had history of hyponatremia in the past  Hypotension --got 1 liter NS and vitals now stable --hold BP meds for today  Chronic Schizophrenia:  --controlled on antipsychotics.   CAD: Status post stent in the past.  Patient is asymptomatic on this admission.  Cardiac markers negative.  CPK levels of 242 and unremarkable.  Afib : History of paroxysmal atrial fibrillation.  Patient currently in sinus rhythm.  Continue with home medications.  Chronic anemia:  hgb stable  BPH: Positive urinary symptoms.  Patient reports currently not on Flomax at home.  Med rec suggests previous prescription for Flomax.  This will be resumed.  Urology follow-up as outpatient. --making good Urine after receiving IVF  Chronic knee arthritis:  Knee braces in place. Analgesics PRN  Family communication :none Consults :none CODE STATUS: full DVT Prophylaxis :lovenox Level of care: Med-Surg Status is: Inpatient  Remains inpatient appropriate because:Inpatient level of care appropriate due to severity of illness  Patient continues to have low sodium. He is otherwise asymptomatic. Nephrology consultation placed PT yet to see patient.  Dispo: The patient is from: Home              Anticipated d/c is XT:GGYIR              Patient currently is not medically stable to d/c.   Difficult to place patient No  Patient's sodium continues to decline. Continue management per nephrology recommendation. Patient will discharge to rehab once stable  Patient developed hypotension today. Holding BP meds. Giving IV fluids.    TOTAL TIME TAKING CARE OF THIS PATIENT: 25 minutes.  >50% time spent on counselling and coordination of care  Note: This dictation was prepared  with Dragon dictation along with smaller phrase technology. Any transcriptional errors that result from this process are unintentional.  Enedina Finner M.D    [facility]   CC: Primary care physician; Sherron Monday, MDPatient ID: Brandon [name], male   DOB: 08/07/1956, 65 y.o.   MRN: [id number]

## 2020-12-01 NOTE — Progress Notes (Signed)
   12/01/20 1021  Assess: MEWS Score  BP (!) 70/55  Pulse Rate 80  SpO2 97 %  Assess: MEWS Score  MEWS Temp 0  MEWS Systolic 2  MEWS Pulse 0  MEWS RR 0  MEWS LOC 0  MEWS Score 2  MEWS Score Color Yellow  Assess: if the MEWS score is Yellow or Red  Were vital signs taken at a resting state? Yes  Focused Assessment Change from prior assessment (see assessment flowsheet)  Early Detection of Sepsis Score *See Row Information* Low  MEWS guidelines implemented *See Row Information* Yes  Treat  MEWS Interventions Escalated (See documentation below)  Pain Scale 0-10  Pain Score Asleep (not very responsive)  Take Vital Signs  Increase Vital Sign Frequency  Yellow: Q 2hr X 2 then Q 4hr X 2, if remains yellow, continue Q 4hrs  Escalate  MEWS: Escalate Yellow: discuss with charge nurse/RN and consider discussing with provider and RRT  Notify: Charge Nurse/RN  Name of Charge Nurse/RN Notified Elnita Maxwell [facility] RN  Date Charge Nurse/RN Notified 12/01/20  Time Charge Nurse/RN Notified 1021  Notify: Provider  Provider Name/Title Patel MD  Date Provider Notified 12/01/20  Time Provider Notified 1021  Notification Type Page  Notification Reason Change in status  Provider response At bedside  Date of Provider Response 12/01/20  Time of Provider Response 1021  Notify: Rapid Response  Name of Rapid Response RN Notified Charlie RN  Date Rapid Response Notified 12/01/20  Time Rapid Response Notified 1021  Document  Patient Outcome Not stable and remains on department (bolus infusing)  Progress note created (see row info) Yes

## 2020-12-01 NOTE — Consult Note (Signed)
MEDICATION RELATED CONSULT NOTE - INITIAL   Pharmacy Consult for tolvaptan per pharmacy monitoring Indication: Hyponatremia  Allergies  Allergen Reactions  . Compazine [Prochlorperazine] Other (See Comments)    Dystonic rxn - convulsions. Near fatal reaction.   Ardine Bjork [Iodinated Diagnostic Agents] Shortness Of Breath    Pt states SOB after last IV contrast injection  . Alcohol-Sulfur [Elemental Sulfur] Other (See Comments)    History of alcoholism  . Benadryl [Diphenhydramine] Other (See Comments)    "stuffy", nasal congestion  . Depakote [Divalproex Sodium] Other (See Comments)    Cause elevated ammonia  . Dramamine [Dimenhydrinate] Swelling  . Plavix [Clopidogrel] Other (See Comments)    Rectal bleeding. "Perforated my intestines."  . Rosuvastatin     Other reaction(s): Other (See Comments)    Patient Measurements: Height: 6' (182.9 cm) Weight: 90.7 kg (200 lb) IBW/kg (Calculated) : 77.6  Vital Signs: Temp: 98.4 F (36.9 C) (04/22 2347) Temp Source: Oral (04/22 1545) BP: 101/70 (04/22 2347) Pulse Rate: 93 (04/22 2347) Intake/Output from previous day: 04/22 0701 - 04/23 0700 In: 120 [P.O.:120] Out: 3125 [Urine:3125] Intake/Output from this shift: Total I/O In: -  Out: 1000 [Urine:1000]  Labs: Recent Labs    11/28/20 0553 11/29/20 0545 11/30/20 1020  WBC 6.8  --   --   HGB 12.7*  --   --   HCT 38.7*  --   --   PLT 270  --   --   CREATININE 0.78 0.61 0.59*  MG 1.7  --   --   PHOS 4.3  --   --   ALBUMIN  --   --  3.2*  PROT  --   --  5.8*  AST  --   --  23  ALT  --   --  26  ALKPHOS  --   --  64  BILITOT  --   --  0.8  BILIDIR  --   --  0.2  IBILI  --   --  0.6   Estimated Creatinine Clearance: 102.4 mL/min (A) (by C-G formula based on SCr of 0.59 mg/dL (L)).  Medical History: Past Medical History:  Diagnosis Date  . Anemia    IDA  . Colon polyps   . COPD (chronic obstructive pulmonary disease) (HCC)   . Coronary artery disease   . Heart  attack (HCC) 2005  . Hiatal hernia   . History of ETOH abuse   . Hyperlipidemia   . Hypertension   . Pleurisy   . Schizophrenia (HCC)   . Schizophrenia (HCC)   . Seizures (HCC)    grand mal in 2000 or 2001 recovery from alcoholism  . Suicide attempt [facility])      Assessment: 65 y.o.malepresented to the emergency room with complaintsof feeling lightheaded and generally weak was found to have a sodium level of 123. Hyponatremia likely multifactorial, thought to be hypovolemic hyponatremia but clozapine may be contributing. Sodium improved to 126 with IVF, back down to 122 today. Pharmacy has been consulted for tolvaptan monitoring.  LFTs WNL Na 123>129>126>122 > 118>122>125  Goal of Therapy:  Sodium WNL  Plan:  Tolvaptan 15 mg x1 ordered  Check baseline serum sodium, followed by every 8 hours x 2   Alert MD if >8 mEq/L increase in serum sodium in 8 hours  Discontinue if serum sodium rises >12 mEq/L within 24 hours Monitor LFTs  if tolvaptan continued for more than 3 doses  Otelia Sergeant, PharmD, [facility] 12/01/2020 3:47  AM

## 2020-12-01 NOTE — Progress Notes (Signed)
Patient not very responsive, BP low. Rapid response called. Bolus ordered from MD. Blood sugar ok.

## 2020-12-01 NOTE — Progress Notes (Signed)
   12/01/20 1015  Clinical Encounter Type  Visited With Patient  Visit Type Initial;Spiritual support;Social support  Referral From Nurse  Consult/Referral To Chaplain   Chaplain responded to a RR page. Chaplain checked in with medical team. The nurse stated everything was ok and thanked the chaplain for stopping by.

## 2020-12-01 NOTE — Progress Notes (Signed)
BP low, MD notified. Orders placed for bolus and IVF at 75/hr

## 2020-12-01 NOTE — Progress Notes (Signed)
[facility]  ROUNDING NOTE   Subjective:   Brandon [name] is a 65 y.o. male with past medical history of hypertension, hyperlipidemia, COPD, schizophrenia and coronary disease post stent. He presents to the ED with complaints of weakness and being lightheaded.   Patient seen today on first floor Patient is resting comfortably in the chair Patient offers no new specific physical complaints. Patient main complaint in today visit was the hospital for his bland.   Objective:  Vital signs in last 24 hours:  Temp:  [97.4 F (36.3 C)-98.4 F (36.9 C)] 97.4 F (36.3 C) (04/23 1152) Pulse Rate:  [72-93] 81 (04/23 1152) Resp:  [18-23] 22 (04/23 1152) BP: (58-128)/(47-91) 108/83 (04/23 1152) SpO2:  [95 %-99 %] 99 % (04/23 1152)  Weight change:  Filed Weights   11/27/20 2021  Weight: 90.7 kg    Intake/Output: I/O last 3 completed shifts: In: 120 [P.O.:120] Out: 4925 [Urine:4925]   Intake/Output this shift:  Total I/O In: -  Out: 450 [Urine:450]  Physical Exam: General: NAD, resting in bed  Head: Normocephalic, atraumatic. Moist oral mucosal membranes  Eyes: Anicteric  Lungs:  Clear to auscultation  Heart: Regular rate and rhythm  Abdomen:  Soft, nontender,   Extremities:  no peripheral edema.  Neurologic: Nonfocal, moving all four extremities  Skin: No lesions       Basic Metabolic Panel: Recent Labs  Lab 11/27/20 2028 11/28/20 0553 11/29/20 0545 11/30/20 0342 11/30/20 1020 11/30/20 1750 12/01/20 0126  NA 123* 129* 126* 122* 118* 122* 125*  K 3.9 3.8 3.8  --  3.6  --   --   CL 90* 97* 96*  --  86*  --   --   CO2 25 24 25   --  24  --   --   GLUCOSE 109* 95 105*  --  109*  --   --   BUN 9 6* 6*  --  6*  --   --   CREATININE 0.81 0.78 0.61  --  0.59*  --   --   CALCIUM 8.7* 8.8* 8.3*  --  8.4*  --   --   MG  --  1.7  --   --   --   --   --   PHOS  --  4.3  --   --   --   --   --     Liver Function Tests: Recent Labs  Lab 11/27/20 2028  11/30/20 1020  AST 27 23  ALT 30 26  ALKPHOS 68 64  BILITOT 0.6 0.8  PROT 6.2* 5.8*  ALBUMIN 3.6 3.2*   No results for input(s): LIPASE, AMYLASE in the last 168 hours. No results for input(s): AMMONIA in the last 168 hours.  CBC: Recent Labs  Lab 11/27/20 2028 11/28/20 0553 12/01/20 1040  WBC 8.1 6.8 7.4  NEUTROABS  --  4.3  --   HGB 12.7* 12.7* 11.6*  HCT 36.9* 38.7* 35.0*  MCV 84.4 83.9 84.1  PLT 320 270 270    Cardiac Enzymes: Recent Labs  Lab 11/27/20 2028  CKTOTAL 272    BNP: Invalid input(s): POCBNP  CBG: Recent Labs  Lab 12/01/20 1019  GLUCAP 183*    Microbiology: Results for orders placed or performed during the hospital encounter of 11/27/20  Resp Panel by RT-PCR (Flu A&B, Covid) Nasopharyngeal Swab     Status: None   Collection Time: 11/27/20 11:02 PM   Specimen: Nasopharyngeal Swab; Nasopharyngeal(NP) swabs in vial transport  medium  Result Value Ref Range Status   SARS Coronavirus 2 by RT PCR NEGATIVE NEGATIVE Final    Comment: (NOTE) SARS-CoV-2 target nucleic acids are NOT DETECTED.  The SARS-CoV-2 RNA is generally detectable in upper respiratory specimens during the acute phase of infection. The lowest concentration of SARS-CoV-2 viral copies this assay can detect is 138 copies/mL. A negative result does not preclude SARS-Cov-2 infection and should not be used as the sole basis for treatment or other patient management decisions. A negative result may occur with  improper specimen collection/handling, submission of specimen other than nasopharyngeal swab, presence of viral mutation(s) within the areas targeted by this assay, and inadequate number of viral copies(<138 copies/mL). A negative result must be combined with clinical observations, patient history, and epidemiological information. The expected result is Negative.  Fact Sheet for Patients:  BloggerCourse.com  Fact Sheet for Healthcare Providers:   SeriousBroker.it  This test is no t yet approved or cleared by the [place] FDA and  has been authorized for detection and/or diagnosis of SARS-CoV-2 by FDA under an Emergency Use Authorization (EUA). This EUA will remain  in effect (meaning this test can be used) for the duration of the COVID-19 declaration under Section 564(b)(1) of the Act, 21 U.S.C.section 360bbb-3(b)(1), unless the authorization is terminated  or revoked sooner.       Influenza A by PCR NEGATIVE NEGATIVE Final   Influenza B by PCR NEGATIVE NEGATIVE Final    Comment: (NOTE) The Xpert Xpress SARS-CoV-2/FLU/RSV plus assay is intended as an aid in the diagnosis of influenza from Nasopharyngeal swab specimens and should not be used as a sole basis for treatment. Nasal washings and aspirates are unacceptable for Xpert Xpress SARS-CoV-2/FLU/RSV testing.  Fact Sheet for Patients: BloggerCourse.com  Fact Sheet for Healthcare Providers: SeriousBroker.it  This test is not yet approved or cleared by the [place] FDA and has been authorized for detection and/or diagnosis of SARS-CoV-2 by FDA under an Emergency Use Authorization (EUA). This EUA will remain in effect (meaning this test can be used) for the duration of the COVID-19 declaration under Section 564(b)(1) of the Act, 21 U.S.C. section 360bbb-3(b)(1), unless the authorization is terminated or revoked.  Performed at [facility], [address] [place], [place] [address]     Coagulation Studies: No results for input(s): LABPROT, INR in the last 72 hours.  Urinalysis: No results for input(s): COLORURINE, LABSPEC, PHURINE, GLUCOSEU, HGBUR, BILIRUBINUR, KETONESUR, PROTEINUR, UROBILINOGEN, NITRITE, LEUKOCYTESUR in the last 72 hours.  Invalid input(s): APPERANCEUR    Imaging: No results found.   Medications:   . sodium chloride     . alum & mag  hydroxide-simeth  30 mL Oral TID AC & HS  . buPROPion  100 mg Oral Daily  . carvedilol  25 mg Oral BID WC  . cloZAPine  200 mg Oral QHS  . docusate sodium  100 mg Oral Daily  . dorzolamide-timolol  1 drop Both Eyes BID  . enoxaparin (LOVENOX) injection  40 mg Subcutaneous Q24H  . ezetimibe  10 mg Oral QHS  . famotidine  20 mg Oral BID  . fenofibrate  160 mg Oral Daily  . fluticasone  2 spray Each Nare Daily  . fluticasone furoate-vilanterol  1 puff Inhalation Daily  . irbesartan  300 mg Oral Daily  . isosorbide mononitrate  30 mg Oral BID  . latanoprost  1 drop Both Eyes QHS  . simvastatin  40 mg Oral QHS  . sodium chloride  1 g Oral TID WC  . tamsulosin  0.4 mg Oral Daily   acetaminophen, albuterol, HYDROcodone-acetaminophen, hydrOXYzine, nitroGLYCERIN, senna  Assessment/ Plan:  Mr. ONYX SCHIRMER is a 65 y.o.  male with past medical history of hypertension, hyperlipidemia, COPD, schizophrenia and coronary disease post stent.   1. Hyponatremia with history of polydipsia Etiology of hyponatremia -Poor osmolar intake -History of polydipsia Patient was started on tolvaptan Patient sodium is improving slowly Patient had serum sodium of 123 on November 27, 2020, and responded initially to IV fluids with improvement to 129 likely decrease 218 and nephrology was involved Patient was thereafter started on tolvaptan     Lab Results  Component Value Date   CREATININE 0.59 (L) 11/30/2020   CREATININE 0.61 11/29/2020   CREATININE 0.78 11/28/2020    Intake/Output Summary (Last 24 hours) at 12/01/2020 1410 Last data filed at 12/01/2020 1247 Gross per 24 hour  Intake --  Output 2550 ml  Net -2550 ml   2. Hypotension: Patient blood pressure is now on the lower side We will reduce patient's antihypertensives   3.  Diastolic CHF Patient 2D echo shows diastolic dysfunction. Patient is currently well compensated   4.  Anemia Patient has hemoglobin stable at 12.7   5.  Prediabetes Educated the patient about his hemoglobin A1c results being on the higher side   Plan  Will reduce patient's antihypertensives We will ask for serum a.m. cortisol levels     LOS: 4 Katharina Jehle s Ivet Guerrieri 4/23/20222:10 PM

## 2020-12-01 NOTE — Significant Event (Signed)
Rapid Response Event Note   Reason for Call :  Low BP  Initial Focused Assessment:  Per Dr. Allena Katz and bedside RN Marchelle Folks- pt got up to chair and BP dropped to the 50's systolic- patient became pale.  Other vitals and blood sugar stable.    Interventions:  Patient in chair- 1 liter fluid bolus given- new IV placed- patient alert and oriented- his color back to normal- BP up to 70's systolic and MAP 65. Dr. Allena Katz aware.  Plan of Care:  Dr. Allena Katz stated to finish the bolus and reassess the patient's blood pressure.   Event Summary:   MD Notified: Dr. Allena Katz Call Time: Arrival Time: Already in room when I arrived.  End Time:11:02  Williemae Natter, RN

## 2020-12-02 ENCOUNTER — Encounter: Payer: Self-pay | Admitting: Internal Medicine

## 2020-12-02 ENCOUNTER — Inpatient Hospital Stay: Payer: Medicare Other

## 2020-12-02 DIAGNOSIS — E871 Hypo-osmolality and hyponatremia: Secondary | ICD-10-CM | POA: Diagnosis not present

## 2020-12-02 DIAGNOSIS — R531 Weakness: Secondary | ICD-10-CM | POA: Diagnosis not present

## 2020-12-02 DIAGNOSIS — F209 Schizophrenia, unspecified: Secondary | ICD-10-CM | POA: Diagnosis not present

## 2020-12-02 DIAGNOSIS — I10 Essential (primary) hypertension: Secondary | ICD-10-CM | POA: Diagnosis not present

## 2020-12-02 LAB — SODIUM: Sodium: 133 mmol/L — ABNORMAL LOW (ref 135–145)

## 2020-12-02 LAB — CORTISOL-AM, BLOOD: Cortisol - AM: 13.1 ug/dL (ref 6.7–22.6)

## 2020-12-02 NOTE — Progress Notes (Signed)
[facility]  ROUNDING NOTE   Subjective:   Brandon [name] is a 65 y.o. male with past medical history of hypertension, hyperlipidemia, COPD, schizophrenia and coronary disease post stent. He presents to the ED with complaints of weakness and being lightheaded.   Patient seen today on first floor. Patient is resting comfortably in the bed. Patient offers no new specific physical complaints. Patient main complaint in today visit was he was feeling better than before   Objective:  Vital signs in last 24 hours:  Temp:  [97.4 F (36.3 C)-98.6 F (37 C)] 98.6 F (37 C) (04/24 1215) Pulse Rate:  [33-105] 93 (04/24 1215) Resp:  [18-20] 20 (04/24 1215) BP: (64-173)/(50-112) 147/92 (04/24 1215) SpO2:  [91 %-97 %] 95 % (04/24 1215)  Weight change:  Filed Weights   11/27/20 2021  Weight: 90.7 kg    Intake/Output: I/O last 3 completed shifts: In: 1272 [P.O.:240; I.V.:902.7; IV Piggyback:129.3] Out: 5080 [Urine:5080]   Intake/Output this shift:  Total I/O In: 665.1 [P.O.:240; I.V.:425.1] Out: 500 [Urine:500]  Physical Exam: General: NAD, resting in bed  Head: Normocephalic, atraumatic. Moist oral mucosal membranes  Eyes: Anicteric  Lungs:  Clear to auscultation  Heart: Regular rate and rhythm  Abdomen:  Soft, nontender,   Extremities:  no peripheral edema.  Neurologic: Nonfocal, moving all four extremities  Skin: No lesions       Basic Metabolic Panel: Recent Labs  Lab 11/27/20 2028 11/28/20 0553 11/29/20 0545 11/30/20 0342 11/30/20 1020 11/30/20 1750 12/01/20 0126 12/02/20 0548  NA 123* 129* 126* 122* 118* 122* 125* 133*  K 3.9 3.8 3.8  --  3.6  --   --   --   CL 90* 97* 96*  --  86*  --   --   --   CO2 25 24 25   --  24  --   --   --   GLUCOSE 109* 95 105*  --  109*  --   --   --   BUN 9 6* 6*  --  6*  --   --   --   CREATININE 0.81 0.78 0.61  --  0.59*  --   --   --   CALCIUM 8.7* 8.8* 8.3*  --  8.4*  --   --   --   MG  --  1.7  --   --   --    --   --   --   PHOS  --  4.3  --   --   --   --   --   --     Liver Function Tests: Recent Labs  Lab 11/27/20 2028 11/30/20 1020  AST 27 23  ALT 30 26  ALKPHOS 68 64  BILITOT 0.6 0.8  PROT 6.2* 5.8*  ALBUMIN 3.6 3.2*   No results for input(s): LIPASE, AMYLASE in the last 168 hours. No results for input(s): AMMONIA in the last 168 hours.  CBC: Recent Labs  Lab 11/27/20 2028 11/28/20 0553 12/01/20 1040  WBC 8.1 6.8 7.4  NEUTROABS  --  4.3  --   HGB 12.7* 12.7* 11.6*  HCT 36.9* 38.7* 35.0*  MCV 84.4 83.9 84.1  PLT 320 270 270    Cardiac Enzymes: Recent Labs  Lab 11/27/20 2028  CKTOTAL 272    BNP: Invalid input(s): POCBNP  CBG: Recent Labs  Lab 12/01/20 1019  GLUCAP 183*    Microbiology: Results for orders placed or performed during the hospital encounter  of 11/27/20  Resp Panel by RT-PCR (Flu A&B, Covid) Nasopharyngeal Swab     Status: None   Collection Time: 11/27/20 11:02 PM   Specimen: Nasopharyngeal Swab; Nasopharyngeal(NP) swabs in vial transport medium  Result Value Ref Range Status   SARS Coronavirus 2 by RT PCR NEGATIVE NEGATIVE Final    Comment: (NOTE) SARS-CoV-2 target nucleic acids are NOT DETECTED.  The SARS-CoV-2 RNA is generally detectable in upper respiratory specimens during the acute phase of infection. The lowest concentration of SARS-CoV-2 viral copies this assay can detect is 138 copies/mL. A negative result does not preclude SARS-Cov-2 infection and should not be used as the sole basis for treatment or other patient management decisions. A negative result may occur with  improper specimen collection/handling, submission of specimen other than nasopharyngeal swab, presence of viral mutation(s) within the areas targeted by this assay, and inadequate number of viral copies(<138 copies/mL). A negative result must be combined with clinical observations, patient history, and epidemiological information. The expected result is  Negative.  Fact Sheet for Patients:  BloggerCourse.com  Fact Sheet for Healthcare Providers:  SeriousBroker.it  This test is no t yet approved or cleared by the [place] FDA and  has been authorized for detection and/or diagnosis of SARS-CoV-2 by FDA under an Emergency Use Authorization (EUA). This EUA will remain  in effect (meaning this test can be used) for the duration of the COVID-19 declaration under Section 564(b)(1) of the Act, 21 U.S.C.section 360bbb-3(b)(1), unless the authorization is terminated  or revoked sooner.       Influenza A by PCR NEGATIVE NEGATIVE Final   Influenza B by PCR NEGATIVE NEGATIVE Final    Comment: (NOTE) The Xpert Xpress SARS-CoV-2/FLU/RSV plus assay is intended as an aid in the diagnosis of influenza from Nasopharyngeal swab specimens and should not be used as a sole basis for treatment. Nasal washings and aspirates are unacceptable for Xpert Xpress SARS-CoV-2/FLU/RSV testing.  Fact Sheet for Patients: BloggerCourse.com  Fact Sheet for Healthcare Providers: SeriousBroker.it  This test is not yet approved or cleared by the [place] FDA and has been authorized for detection and/or diagnosis of SARS-CoV-2 by FDA under an Emergency Use Authorization (EUA). This EUA will remain in effect (meaning this test can be used) for the duration of the COVID-19 declaration under Section 564(b)(1) of the Act, 21 U.S.C. section 360bbb-3(b)(1), unless the authorization is terminated or revoked.  Performed at [facility], [address] [place], [place] [address]     Coagulation Studies: No results for input(s): LABPROT, INR in the last 72 hours.  Urinalysis: No results for input(s): COLORURINE, LABSPEC, PHURINE, GLUCOSEU, HGBUR, BILIRUBINUR, KETONESUR, PROTEINUR, UROBILINOGEN, NITRITE, LEUKOCYTESUR in the last 72 hours.  Invalid  input(s): APPERANCEUR    Imaging: DG Chest Port 1 View  Result Date: 12/02/2020 CLINICAL DATA:  Increased shortness of breath for 1 hour EXAM: PORTABLE CHEST 1 VIEW COMPARISON:  Five days ago FINDINGS: Normal heart size and stable mediastinal contours. Mild streaky density bilaterally at the bases, unchanged and likely from low volumes with interstitial crowding. No Kerley lines, effusion, or pneumothorax. IMPRESSION: Stable from prior.  No evidence of acute disease. Electronically Signed   By: Marnee Spring M.D.   On: 12/02/2020 04:16     Medications:   . sodium chloride     . alum & mag hydroxide-simeth  30 mL Oral TID AC & HS  . buPROPion  100 mg Oral Daily  . carvedilol  6.25 mg Oral BID WC  .  cloZAPine  200 mg Oral QHS  . docusate sodium  100 mg Oral Daily  . dorzolamide-timolol  1 drop Both Eyes BID  . enoxaparin (LOVENOX) injection  40 mg Subcutaneous Q24H  . ezetimibe  10 mg Oral QHS  . famotidine  20 mg Oral BID  . fenofibrate  160 mg Oral Daily  . fluticasone  2 spray Each Nare Daily  . fluticasone furoate-vilanterol  1 puff Inhalation Daily  . irbesartan  75 mg Oral Daily  . isosorbide mononitrate  30 mg Oral BID  . latanoprost  1 drop Both Eyes QHS  . simvastatin  40 mg Oral QHS  . sodium chloride  1 g Oral TID WC  . tamsulosin  0.4 mg Oral Daily   acetaminophen, albuterol, HYDROcodone-acetaminophen, hydrOXYzine, nitroGLYCERIN, senna  Assessment/ Plan:  Mr. Brandon [name] is a 65 y.o.  male with past medical history of hypertension, hyperlipidemia, COPD, schizophrenia and coronary disease post stent.   1. Hyponatremia with history of polydipsia Etiology of hyponatremia -Poor osmolar intake -History of polydipsia Patient was started on tolvaptan Patient sodium is improving slowly Patient had serum sodium of 123 on November 27, 2020, and responded initially to IV fluids with improvement to 129 likely decrease 218 and nephrology was involved Patient was  thereafter started on tolvaptan Patient sodium is now much better  at 133  Patient was hypotensive yesterday so I asked for cortisol level in an effort to rule out adrenal insufficiency as a cause of hyponatremia Patient serum a.m. cortisol level came back within normal limits  Results for TAIM, WURM (MRN [id number]) as of 12/02/2020 14:36  Ref. Range 12/02/2020 05:48  Cortisol - AM Latest Ref Range: 6.7 - 22.6 ug/dL 62.8    Intake/Output Summary (Last 24 hours) at 12/02/2020 1436 Last data filed at 12/02/2020 1217 Gross per 24 hour  Intake 1937.01 ml  Output 4130 ml  Net -2192.99 ml   2. Hypertension:  Patient was orthostatic yesterday and patient antihypertensives were reduced Patient blood pressure on the higher side but stable for the acute state   3.  Diastolic CHF Patient 2D echo shows diastolic dysfunction. Patient is currently well compensated   4.  Anemia Patient has hemoglobin stable at 12.7   5. Prediabetes Educated the patient about his hemoglobin A1c results being on the higher side   Plan   We will continue current treatment     LOS: 5 Torry Adamczak s [facility] 4/24/20222:36 PM

## 2020-12-02 NOTE — Progress Notes (Signed)
[facility]  - [place] [facility]   PATIENT NAME: Brandon [name]    MR#:  [id number]  DATE OF BIRTH:  June 03, 1956  SUBJECTIVE:   Appears back to baseline. Eating well. Appears well hydrated. Blood pressure much improved infective bed on the higher side. Got his home meds this morning. REVIEW OF SYSTEMS:   Review of Systems  Constitutional: Negative for chills, fever and weight loss.  HENT: Negative for ear discharge, ear pain and nosebleeds.   Eyes: Negative for blurred vision, pain and discharge.  Respiratory: Negative for sputum production, shortness of breath, wheezing and stridor.   Cardiovascular: Negative for chest pain, palpitations, orthopnea and PND.  Gastrointestinal: Negative for abdominal pain, diarrhea, nausea and vomiting.  Genitourinary: Negative for frequency and urgency.  Musculoskeletal: Negative for back pain and joint pain.  Neurological: Positive for weakness. Negative for sensory change, speech change and focal weakness.  Psychiatric/Behavioral: Negative for depression and hallucinations. The patient is not nervous/anxious.    Tolerating Diet:yes Tolerating PT: rehab  DRUG ALLERGIES:   Allergies  Allergen Reactions  . Compazine [Prochlorperazine] Other (See Comments)    Dystonic rxn - convulsions. Near fatal reaction.   Ardine Bjork [Iodinated Diagnostic Agents] Shortness Of Breath    Pt states SOB after last IV contrast injection  . Alcohol-Sulfur [Elemental Sulfur] Other (See Comments)    History of alcoholism  . Benadryl [Diphenhydramine] Other (See Comments)    "stuffy", nasal congestion  . Depakote [Divalproex Sodium] Other (See Comments)    Cause elevated ammonia  . Dramamine [Dimenhydrinate] Swelling  . Plavix [Clopidogrel] Other (See Comments)    Rectal bleeding. "Perforated my intestines."  . Rosuvastatin     Other reaction(s): Other (See Comments)    VITALS:  Blood pressure (!) 147/92, pulse 93, temperature 98.6 F (37  C), resp. rate 20, height 6' (1.829 m), weight 90.7 kg, SpO2 95 %.  PHYSICAL EXAMINATION:   Physical Exam  GENERAL:  65 y.o.-year-old patient lying in the bed with no acute distress.  LUNGS: Normal breath sounds bilaterally, no wheezing, rales, rhonchi. No use of accessory muscles of respiration.  CARDIOVASCULAR: S1, S2 normal. No murmurs, rubs, or gallops.  ABDOMEN: Soft, nontender, nondistended. Bowel sounds present. No organomegaly or mass.  EXTREMITIES: No cyanosis, clubbing or edema b/l.    NEUROLOGIC: Cranial nerves II through XII are intact. No focal Motor or sensory deficits b/l.   PSYCHIATRIC:  patient is alert and oriented x 3.  SKIN: No obvious rash, lesion, or ulcer.   LABORATORY PANEL:  CBC Recent Labs  Lab 12/01/20 1040  WBC 7.4  HGB 11.6*  HCT 35.0*  PLT 270    Chemistries  Recent Labs  Lab 11/28/20 0553 11/29/20 0545 11/30/20 1020 11/30/20 1750 12/02/20 0548  NA 129*   < > 118*   < > 133*  K 3.8   < > 3.6  --   --   CL 97*   < > 86*  --   --   CO2 24   < > 24  --   --   GLUCOSE 95   < > 109*  --   --   BUN 6*   < > 6*  --   --   CREATININE 0.78   < > 0.59*  --   --   CALCIUM 8.8*   < > 8.4*  --   --   MG 1.7  --   --   --   --  AST  --   --  23  --   --   ALT  --   --  26  --   --   ALKPHOS  --   --  64  --   --   BILITOT  --   --  0.8  --   --    < > = values in this interval not displayed.   Cardiac Enzymes No results for input(s): TROPONINI in the last 168 hours. RADIOLOGY:  DG Chest Port 1 View  Result Date: 12/02/2020 CLINICAL DATA:  Increased shortness of breath for 1 hour EXAM: PORTABLE CHEST 1 VIEW COMPARISON:  Five days ago FINDINGS: Normal heart size and stable mediastinal contours. Mild streaky density bilaterally at the bases, unchanged and likely from low volumes with interstitial crowding. No Kerley lines, effusion, or pneumothorax. IMPRESSION: Stable from prior.  No evidence of acute disease. Electronically Signed   By: Marnee Spring M.D.   On: 12/02/2020 04:16   ASSESSMENT AND PLAN:  Brandon [name] is an 65 y.o. male with medical history significant for schizophrenia, hypertension, hyperlipidemia, COPD not on oxygen therapy, coronary disease status post stent and hypertension. He presented to the emergency room with complaints of feeling lightheaded and generally weak.  He also complained of muscle cramps.  Hyponatremia: Multifactorial.  -- Dehydration, poor po intake and hypovolemia most likely. ?psych meds --came in with Sodium of 123--129--126--stopped ivf--122--118--Tolvaptan--125--133 --eating ok, stable mentation --good uop -- nephrology consultation --tolvaptan x 1 and salt tabs -- Patient has had history of hyponatremia in the past  Hypertension -- continue current meds  Chronic Schizophrenia:  --controlled on antipsychotics.   CAD: Status post stent in the past.  Patient is asymptomatic on this admission.  Cardiac markers negative.  CPK levels of 242 and unremarkable.  Afib : History of paroxysmal atrial fibrillation.  Patient currently in sinus rhythm.  Continue with home medications.  Chronic anemia:  hgb stable  BPH: Positive urinary symptoms.  Patient reports currently not on Flomax at home.  Med rec suggests previous prescription for Flomax.  This will be resumed.  Urology follow-up as outpatient. --making good Urine after receiving IVF  Chronic knee arthritis:  Knee braces in place. Analgesics PRN  Family communication :none Consults :none CODE STATUS: full DVT Prophylaxis :lovenox Level of care: Med-Surg Status is: Inpatient  Dispo: The patient is from: Home              Anticipated d/c is [id number]              Patient currently medically optimized and stable for discharge   Difficult to place patient No  Patient hemodynamically stable today. Sodium 133. [facility]    TOTAL TIME TAKING CARE OF THIS PATIENT: 25 minutes.  >50% time spent on  counselling and coordination of care  Note: This dictation was prepared with Dragon dictation along with smaller phrase technology. Any transcriptional errors that result from this process are unintentional.  Enedina Finner M.D    [facility]   CC: Primary care physician; Sherron Monday, MDPatient ID: [name], male   DOB: [birth year], 65 y.o.   MRN: [id number]

## 2020-12-03 DIAGNOSIS — I10 Essential (primary) hypertension: Secondary | ICD-10-CM | POA: Diagnosis not present

## 2020-12-03 DIAGNOSIS — R0602 Shortness of breath: Secondary | ICD-10-CM | POA: Diagnosis not present

## 2020-12-03 DIAGNOSIS — D649 Anemia, unspecified: Secondary | ICD-10-CM | POA: Diagnosis not present

## 2020-12-03 DIAGNOSIS — R531 Weakness: Secondary | ICD-10-CM | POA: Diagnosis not present

## 2020-12-03 DIAGNOSIS — M6281 Muscle weakness (generalized): Secondary | ICD-10-CM | POA: Diagnosis not present

## 2020-12-03 DIAGNOSIS — F209 Schizophrenia, unspecified: Secondary | ICD-10-CM | POA: Diagnosis not present

## 2020-12-03 DIAGNOSIS — E876 Hypokalemia: Secondary | ICD-10-CM | POA: Diagnosis not present

## 2020-12-03 DIAGNOSIS — E119 Type 2 diabetes mellitus without complications: Secondary | ICD-10-CM | POA: Diagnosis not present

## 2020-12-03 DIAGNOSIS — D509 Iron deficiency anemia, unspecified: Secondary | ICD-10-CM | POA: Diagnosis not present

## 2020-12-03 DIAGNOSIS — M13869 Other specified arthritis, unspecified knee: Secondary | ICD-10-CM | POA: Diagnosis not present

## 2020-12-03 DIAGNOSIS — H5213 Myopia, bilateral: Secondary | ICD-10-CM | POA: Diagnosis not present

## 2020-12-03 DIAGNOSIS — I959 Hypotension, unspecified: Secondary | ICD-10-CM | POA: Diagnosis not present

## 2020-12-03 DIAGNOSIS — Z736 Limitation of activities due to disability: Secondary | ICD-10-CM | POA: Diagnosis not present

## 2020-12-03 DIAGNOSIS — F32A Depression, unspecified: Secondary | ICD-10-CM | POA: Diagnosis not present

## 2020-12-03 DIAGNOSIS — J449 Chronic obstructive pulmonary disease, unspecified: Secondary | ICD-10-CM | POA: Diagnosis not present

## 2020-12-03 DIAGNOSIS — Z23 Encounter for immunization: Secondary | ICD-10-CM | POA: Diagnosis not present

## 2020-12-03 DIAGNOSIS — R498 Other voice and resonance disorders: Secondary | ICD-10-CM | POA: Diagnosis not present

## 2020-12-03 DIAGNOSIS — D508 Other iron deficiency anemias: Secondary | ICD-10-CM | POA: Diagnosis not present

## 2020-12-03 DIAGNOSIS — E871 Hypo-osmolality and hyponatremia: Secondary | ICD-10-CM | POA: Diagnosis not present

## 2020-12-03 DIAGNOSIS — E785 Hyperlipidemia, unspecified: Secondary | ICD-10-CM | POA: Diagnosis not present

## 2020-12-03 DIAGNOSIS — K2100 Gastro-esophageal reflux disease with esophagitis, without bleeding: Secondary | ICD-10-CM | POA: Diagnosis not present

## 2020-12-03 DIAGNOSIS — E860 Dehydration: Secondary | ICD-10-CM | POA: Diagnosis not present

## 2020-12-03 DIAGNOSIS — R2681 Unsteadiness on feet: Secondary | ICD-10-CM | POA: Diagnosis not present

## 2020-12-03 DIAGNOSIS — R059 Cough, unspecified: Secondary | ICD-10-CM | POA: Diagnosis not present

## 2020-12-03 DIAGNOSIS — I2510 Atherosclerotic heart disease of native coronary artery without angina pectoris: Secondary | ICD-10-CM | POA: Diagnosis not present

## 2020-12-03 DIAGNOSIS — I4891 Unspecified atrial fibrillation: Secondary | ICD-10-CM | POA: Diagnosis not present

## 2020-12-03 LAB — RESP PANEL BY RT-PCR (FLU A&B, COVID) ARPGX2
Influenza A by PCR: NEGATIVE
Influenza B by PCR: NEGATIVE
SARS Coronavirus 2 by RT PCR: NEGATIVE

## 2020-12-03 MED ORDER — IPRATROPIUM-ALBUTEROL 0.5-2.5 (3) MG/3ML IN SOLN
3.0000 mL | RESPIRATORY_TRACT | Status: DC | PRN
  Administered 2020-12-03: 3 mL via RESPIRATORY_TRACT
  Filled 2020-12-03: qty 3

## 2020-12-03 MED ORDER — SODIUM CHLORIDE 1 G PO TABS: 1.0000 g | ORAL_TABLET | Freq: Two times a day (BID) | ORAL | 0 refills | Status: AC

## 2020-12-03 NOTE — Plan of Care (Signed)
Patient discharged per MD orders.All discharged instructions,education and medications reviewed with patient.Pt expressed understanding and will comply with dc instructions.F/u appointments also communicated to patient.Pt discharged to [facility] per order.report given to admissions coordinator Yvetta Coder prior to discharge.no verbal c/o or any ssx of distress.Pt transported by 2 non emergency services personnel on a stretcher.

## 2020-12-03 NOTE — Care Management Important Message (Signed)
Important Message  Patient Details  Name: ALIJAH AKRAM MRN: [id number] Date of Birth: 1955/11/30   Medicare Important Message Given:  Other (see comment)  Left a voice message for Ms. Smithwick, SW, [place] [facility] due to a APS referral for this patient to call me at her convenience so I can review the Important Message from Medicare with her. Will await a return call.  Olegario Messier A Tanicia Wolaver 12/03/2020, 9:36 AM

## 2020-12-03 NOTE — Progress Notes (Signed)
Physical Therapy Treatment Patient Details Name: Brandon [name] MRN: [id number] DOB: 1956/03/26 Today's Date: 12/03/2020    History of Present Illness Patient is a 65 y.o. male with medical history significant for schizophrenia, hypertension, hyperlipidemia, COPD not on oxygen therapy, coronary disease status post stent and hypertension. He presented to the emergency room with complaints of feeling lightheaded and generally weak.  He also complained of muscle cramps. Found to have hyponatremia.    PT Comments    Pt was pleasant and motivated to participate during the session.  Orthostatic BP's taken as follows: supine BP 149/94, HR 81, sitting BP 142/101, HR 87, standing BP 114/86, HR 100 with no symptoms, standing after 3 min 133/82, HR 98 without symptoms.  Pt required extra time and effort with bed mobility and needed the bed to be elevated along with UE assist to come to standing.  Pt was able to amb 20 feet with very slow cadence and short B step length and required min A on one occasion to prevent posterior LOB.  Pt will benefit from PT services in a SNF setting upon discharge to safely address deficits listed in patient problem list for decreased caregiver assistance and eventual return to PLOF.     Follow Up Recommendations  SNF     Equipment Recommendations  None recommended by PT    Recommendations for Other Services       Precautions / Restrictions Precautions Precautions: Fall Restrictions Weight Bearing Restrictions: No Other Position/Activity Restrictions: Watch orthostatic BP    Mobility  Bed Mobility Overal bed mobility: Modified Independent             General bed mobility comments: Extra time and effort only    Transfers Overall transfer level: Needs assistance Equipment used: Rolling walker (2 wheeled) Transfers: Sit to/from Stand Sit to Stand: Min guard;From elevated surface         General transfer comment: Pt required the surface to be  elevated along with UE assist to come to standing  Ambulation/Gait Ambulation/Gait assistance: Min assist Gait Distance (Feet): 20 Feet Assistive device: Rolling walker (2 wheeled) Gait Pattern/deviations: Step-through pattern;Decreased step length - right;Decreased step length - left Gait velocity: decreased   General Gait Details: Slow cadence with short B step length and posterior LOB x 1 requiring min A to correct   Stairs             Wheelchair Mobility    Modified [name] (Stroke Patients Only)       Balance Overall balance assessment: History of Falls;Needs assistance Sitting-balance support: Feet supported Sitting balance-[name] Scale: Good     Standing balance support: Bilateral upper extremity supported Standing balance-[name] Scale: Poor Standing balance comment: Posterior LOB x 1 requiring min A to correct                            Cognition Arousal/Alertness: Awake/alert Behavior During Therapy: WFL for tasks assessed/performed Overall Cognitive Status: Within Functional Limits for tasks assessed                                        Exercises Total Joint Exercises Ankle Circles/Pumps: AROM;Strengthening;Both;10 reps Quad Sets: Strengthening;Both;10 reps Gluteal Sets: 10 reps;Both;Strengthening Heel Slides: Strengthening;Both;5 reps Long Arc Quad: Strengthening;Both;10 reps Knee Flexion: Strengthening;Both;10 reps Marching in Standing: Strengthening;Both;10 reps;Seated    General Comments  Pertinent Vitals/Pain Pain Assessment: No/denies pain    Home Living                      Prior Function            PT Goals (current goals can now be found in the care plan section) Progress towards PT goals: Progressing toward goals    Frequency    Min 2X/week      PT Plan Current plan remains appropriate    Co-evaluation              AM-PAC PT "6 Clicks" Mobility   Outcome Measure   Help needed turning from your back to your side while in a flat bed without using bedrails?: A Little Help needed moving from lying on your back to sitting on the side of a flat bed without using bedrails?: A Little Help needed moving to and from a bed to a chair (including a wheelchair)?: A Little Help needed standing up from a chair using your arms (e.g., wheelchair or bedside chair)?: A Little Help needed to walk in hospital room?: A Little Help needed climbing 3-5 steps with a railing? : A Lot 6 Click Score: 17    End of Session Equipment Utilized During Treatment: Gait belt Activity Tolerance: Patient tolerated treatment well Patient left: in chair;with call bell/phone within reach;with chair alarm set Nurse Communication: Mobility status PT Visit Diagnosis: History of falling (Z91.81);Muscle weakness (generalized) (M62.81)     Time: 5366-4403 PT Time Calculation (min) (ACUTE ONLY): 25 min  Charges:  $Therapeutic Exercise: 8-22 mins $Therapeutic Activity: 8-22 mins                     D. Scott Amarria Andreasen PT, DPT 12/03/20, 11:40 AM

## 2020-12-03 NOTE — Discharge Summary (Signed)
[facility] [place] [facility]   PATIENT NAME: Brandon [name]    MR#:  [id number]  DATE OF BIRTH:  June 20, 1956  DATE OF ADMISSION:  11/27/2020 ADMITTING PHYSICIAN: Lilia Pro, MD  DATE OF DISCHARGE: 12/03/2020  PRIMARY CARE PHYSICIAN: Sherron Monday, MD    ADMISSION DIAGNOSIS:  Acute hyponatremia [E87.1] Hyponatremia [E87.1] Weakness [R53.1]  DISCHARGE DIAGNOSIS:  Acute on Chronic Hyponatremia  SECONDARY DIAGNOSIS:   Past Medical History:  Diagnosis Date  . Anemia    IDA  . Colon polyps   . COPD (chronic obstructive pulmonary disease) (HCC)   . Coronary artery disease   . Heart attack (HCC) 2005  . Hiatal hernia   . History of ETOH abuse   . Hyperlipidemia   . Hypertension   . Pleurisy   . Schizophrenia (HCC)   . Schizophrenia (HCC)   . Seizures (HCC)    grand mal in 2000 or 2001 recovery from alcoholism  . Suicide attempt [facility])     HOSPITAL COURSE:   ROEN MACGOWAN an 65 y.o.malewith medical history significant for schizophrenia, hypertension, hyperlipidemia, COPD not on oxygen therapy, coronary disease status post stent and hypertension.He presented to the emergency room with complaintsof feeling lightheaded and generally weak. He also complained of muscle cramps.  Hyponatremia:Multifactorial. Acute on chronic --Dehydration, poor po intake.  --came in with Sodium of 123--129--126--stopped ivf--122--118--Tolvaptan--125--133 --eating ok, stable mentation --good uop -- nephrology consultation --tolvaptan x 1 and salt tabs -- Patient has had history of hyponatremia in the past  Hypertension -- continue current meds  Chronic Schizophrenia: --controlled on antipsychotics.   YQM:VHQION post stent in the past. Patient is asymptomatic on this admission. Cardiac markers negative. CPK levels of 242 and unremarkable.  Afib:History of paroxysmal atrial fibrillation. Patient currently in sinus rhythm. Continue  with home medications.  Chronic anemia: hgb stable  BPH: Positive urinary symptoms.  --Urology follow-up as outpatient. --cont flomax  Chronic knee arthritis: Knee braces in place. Analgesics PRN  Family communication :none Consults :none CODE STATUS: full DVT Prophylaxis :lovenox Level of care: Med-Surg Status is: Inpatient  Dispo:  patient is from: Home  Anticipated d/c is GE:XBMWU  Patient currently medically optimized and stable for discharge              Difficult to place patient No  Patient hemodynamically stable today. Sodium 133. [facility].    CONSULTS OBTAINED:  Treatment Team:  Lamont Dowdy, MD  DRUG ALLERGIES:   Allergies  Allergen Reactions  . Compazine [Prochlorperazine] Other (See Comments)    Dystonic rxn - convulsions. Near fatal reaction.   Ardine Bjork [Iodinated Diagnostic Agents] Shortness Of Breath    Pt states SOB after last IV contrast injection  . Alcohol-Sulfur [Elemental Sulfur] Other (See Comments)    History of alcoholism  . Benadryl [Diphenhydramine] Other (See Comments)    "stuffy", nasal congestion  . Depakote [Divalproex Sodium] Other (See Comments)    Cause elevated ammonia  . Dramamine [Dimenhydrinate] Swelling  . Plavix [Clopidogrel] Other (See Comments)    Rectal bleeding. "Perforated my intestines."  . Rosuvastatin     Other reaction(s): Other (See Comments)    DISCHARGE MEDICATIONS:   Allergies as of 12/03/2020      Reactions   Compazine [prochlorperazine] Other (See Comments)   Dystonic rxn - convulsions. Near fatal reaction.   Ivp Dye [iodinated Diagnostic Agents] Shortness Of Breath   Pt states SOB after last IV contrast injection  Alcohol-sulfur [elemental Sulfur] Other (See Comments)   History of alcoholism   Benadryl [diphenhydramine] Other (See Comments)   "stuffy", nasal congestion   Depakote [divalproex Sodium] Other (See Comments)   Cause elevated  ammonia   Dramamine [dimenhydrinate] Swelling   Plavix [clopidogrel] Other (See Comments)   Rectal bleeding. "Perforated my intestines."   Rosuvastatin    Other reaction(s): Other (See Comments)      Medication List    STOP taking these medications   Entresto 24-26 MG Generic drug: sacubitril-valsartan   metoprolol tartrate 50 MG tablet Commonly known as: LOPRESSOR     TAKE these medications   acetaminophen 325 MG tablet Commonly known as: TYLENOL Take 650 mg by mouth every 4 (four) hours as needed for mild pain or moderate pain.   buPROPion 100 MG 12 hr tablet Commonly known as: WELLBUTRIN SR Take 100 mg by mouth daily.   carvedilol 25 MG tablet Commonly known as: COREG Take 25 mg by mouth 2 (two) times daily with a meal.   cloZAPine 100 MG tablet Commonly known as: CLOZARIL Take 200 mg by mouth at bedtime.   dorzolamide-timolol 22.3-6.8 MG/ML ophthalmic solution Commonly known as: COSOPT Place 1 drop into both eyes 2 (two) times daily.   ezetimibe 10 MG tablet Commonly known as: ZETIA Take 10 mg by mouth at bedtime.   hydrOXYzine 25 MG capsule Commonly known as: VISTARIL Take 25 mg by mouth 2 (two) times daily as needed for anxiety.   isosorbide mononitrate 30 MG 24 hr tablet Commonly known as: IMDUR Take 30 mg by mouth 2 (two) times daily.   latanoprost 0.005 % ophthalmic solution Commonly known as: XALATAN Place 1 drop into both eyes at bedtime.   nitroGLYCERIN 0.4 MG SL tablet Commonly known as: NITROSTAT Place 0.4 mg under the tongue every 5 (five) minutes as needed for chest pain.   olmesartan 20 MG tablet Commonly known as: BENICAR Take 20 mg by mouth daily.   pantoprazole 40 MG tablet Commonly known as: PROTONIX Take 40 mg by mouth 2 (two) times daily.   senna 8.6 MG tablet Commonly known as: SENOKOT Take 1-2 tablets by mouth daily as needed for constipation.   simvastatin 40 MG tablet Commonly known as: ZOCOR Take 40 mg by mouth at  bedtime.   sodium chloride 1 g tablet Take 1 tablet (1 g total) by mouth 2 (two) times daily with a meal for 7 days.   Symbicort 80-4.5 MCG/ACT inhaler Generic drug: budesonide-formoterol Inhale 2 puffs into the lungs 2 (two) times daily.   tamsulosin 0.4 MG Caps capsule Commonly known as: FLOMAX Take 0.4 mg by mouth daily.       If you experience worsening of your admission symptoms, develop shortness of breath, life threatening emergency, suicidal or homicidal thoughts you must seek medical attention immediately by calling 911 or calling your MD immediately  if symptoms less severe.  You Must read complete instructions/literature along with all the possible adverse reactions/side effects for all the Medicines you take and that have been prescribed to you. Take any new Medicines after you have completely understood and accept all the possible adverse reactions/side effects.   Please note  You were cared for by a hospitalist during your hospital stay. If you have any questions about your discharge medications or the care you received while you were in the hospital after you are discharged, you can call the unit and asked to speak with the hospitalist on call if the hospitalist that  took care of you is not available. Once you are discharged, your primary care physician will handle any further medical issues. Please note that NO REFILLS for any discharge medications will be authorized once you are discharged, as it is imperative that you return to your primary care physician (or establish a relationship with a primary care physician if you do not have one) for your aftercare needs so that they can reassess your need for medications and monitor your lab values. Today   SUBJECTIVE  Doing well No new issues reported per RN Vitals stbale VITAL SIGNS:  Blood pressure (!) 147/95, pulse 89, temperature 98.2 F (36.8 C), temperature source Oral, resp. rate 16, height 6' (1.829 m), weight 90.7  kg, SpO2 99 %.  I/O:    Intake/Output Summary (Last 24 hours) at 12/03/2020 0816 Last data filed at 12/03/2020 0053 Gross per 24 hour  Intake 960 ml  Output 1750 ml  Net -790 ml    PHYSICAL EXAMINATION:  GENERAL:  65 y.o.-year-old patient lying in the bed with no acute distress.  LUNGS: Normal breath sounds bilaterally, no wheezing, rales,rhonchi or crepitation. No use of accessory muscles of respiration.  CARDIOVASCULAR: S1, S2 normal. No murmurs, rubs, or gallops.  ABDOMEN: Soft, non-tender, non-distended. Bowel sounds present. No organomegaly or mass.  EXTREMITIES: No pedal edema, cyanosis, or clubbing.  NEUROLOGIC: Cranial nerves II through XII are intact. Muscle strength 5/5 in all extremities. Sensation intact. Gait not checked.  PSYCHIATRIC: The patient is alert and oriented x 3.  SKIN: No obvious rash, lesion, or ulcer.   DATA REVIEW:   CBC  Recent Labs  Lab 12/01/20 1040  WBC 7.4  HGB 11.6*  HCT 35.0*  PLT 270    Chemistries  Recent Labs  Lab 11/28/20 0553 11/29/20 0545 11/30/20 1020 11/30/20 1750 12/02/20 0548  NA 129*   < > 118*   < > 133*  K 3.8   < > 3.6  --   --   CL 97*   < > 86*  --   --   CO2 24   < > 24  --   --   GLUCOSE 95   < > 109*  --   --   BUN 6*   < > 6*  --   --   CREATININE 0.78   < > 0.59*  --   --   CALCIUM 8.8*   < > 8.4*  --   --   MG 1.7  --   --   --   --   AST  --   --  23  --   --   ALT  --   --  26  --   --   ALKPHOS  --   --  64  --   --   BILITOT  --   --  0.8  --   --    < > = values in this interval not displayed.    Microbiology Results   Recent Results (from the past 240 hour(s))  Resp Panel by RT-PCR (Flu A&B, Covid) Nasopharyngeal Swab     Status: None   Collection Time: 11/27/20 11:02 PM   Specimen: Nasopharyngeal Swab; Nasopharyngeal(NP) swabs in vial transport medium  Result Value Ref Range Status   SARS Coronavirus 2 by RT PCR NEGATIVE NEGATIVE Final    Comment: (NOTE) SARS-CoV-2 target nucleic acids  are NOT DETECTED.  The SARS-CoV-2 RNA is generally detectable in upper respiratory specimens during  the acute phase of infection. The lowest concentration of SARS-CoV-2 viral copies this assay can detect is 138 copies/mL. A negative result does not preclude SARS-Cov-2 infection and should not be used as the sole basis for treatment or other patient management decisions. A negative result may occur with  improper specimen collection/handling, submission of specimen other than nasopharyngeal swab, presence of viral mutation(s) within the areas targeted by this assay, and inadequate number of viral copies(<138 copies/mL). A negative result must be combined with clinical observations, patient history, and epidemiological information. The expected result is Negative.  Fact Sheet for Patients:  BloggerCourse.comhttps://www.fda.gov/media/152166/download  Fact Sheet for Healthcare Providers:  SeriousBroker.ithttps://www.fda.gov/media/152162/download  This test is no t yet approved or cleared by the [place] FDA and  has been authorized for detection and/or diagnosis of SARS-CoV-2 by FDA under an Emergency Use Authorization (EUA). This EUA will remain  in effect (meaning this test can be used) for the duration of the COVID-19 declaration under Section 564(b)(1) of the Act, 21 U.S.C.section 360bbb-3(b)(1), unless the authorization is terminated  or revoked sooner.       Influenza A by PCR NEGATIVE NEGATIVE Final   Influenza B by PCR NEGATIVE NEGATIVE Final    Comment: (NOTE) The Xpert Xpress SARS-CoV-2/FLU/RSV plus assay is intended as an aid in the diagnosis of influenza from Nasopharyngeal swab specimens and should not be used as a sole basis for treatment. Nasal washings and aspirates are unacceptable for Xpert Xpress SARS-CoV-2/FLU/RSV testing.  Fact Sheet for Patients: BloggerCourse.comhttps://www.fda.gov/media/152166/download  Fact Sheet for Healthcare Providers: SeriousBroker.ithttps://www.fda.gov/media/152162/download  This test is  not yet approved or cleared by the [place] FDA and has been authorized for detection and/or diagnosis of SARS-CoV-2 by FDA under an Emergency Use Authorization (EUA). This EUA will remain in effect (meaning this test can be used) for the duration of the COVID-19 declaration under Section 564(b)(1) of the Act, 21 U.S.C. section 360bbb-3(b)(1), unless the authorization is terminated or revoked.  Performed at [facility], [address] [place], [place] [address]     [facility]IOLOG[facility]:  [facility]  Result Date: 12/02/2020 CLINICAL DATA:  Increased shortness of breath for 1 hour EXAM: PORTABLE CHEST 1 VIEW COMPARISON:  Five days ago FINDINGS: Normal heart size and stable mediastinal contours. Mild streaky density bilaterally at the bases, unchanged and likely from low volumes with interstitial crowding. No Kerley lines, effusion, or pneumothorax. IMPRESSION: Stable from prior.  No evidence of acute disease. Electronically Signed   By: Marnee SpringJonathon  Watts M.D.   On: 12/02/2020 04:16     CODE STATUS:     Code Status Orders  (From admission, onward)         Start     Ordered   11/27/20 2328  Full code  Continuous        11/27/20 2331        Code Status History    Date Active Date Inactive Code Status Order ID Comments User Context   07/17/2018 1249 07/20/2018 1726 Full Code [id number]  Sung AmabileSakai, Isami, DO Inpatient   05/04/2018 2029 05/07/2018 1634 Full Code [id number]  Sung AmabileSakai, Isami, DO Inpatient   04/08/2018 2011 04/12/2018 1441 Full Code [id number]  Sung AmabileSakai, Isami, DO Inpatient   10/19/2017 1252 10/19/2017 1820 Full Code [id number]  Laurier NancyKhan, Shaukat A, MD Inpatient   Advance Care Planning Activity       TOTAL TIME TAKING CARE OF THIS PATIENT: *40* minutes.    Enedina FinnerSona Quention Mcneill M.D  [facility]  CC: Primary care physician; Sherron Monday, MD

## 2020-12-03 NOTE — Progress Notes (Signed)
[facility]  ROUNDING NOTE   Subjective:   Brandon [name] is a 65 y.o. male with past medical history of hypertension, hyperlipidemia, COPD, schizophrenia and coronary disease post stent. He presents to the ED with complaints of weakness and being lightheaded.   We have been consulted for hyponatremia. On admission, his sodium level was 135. It has steadily declined during this admission.   Patient seen resting in bed.  Alert and oriented States he feels much better today Tolerates meals and appetite has reserved Denies nausea Denies shortness of breath Patient seen later sitting in chair  Objective:  Vital signs in last 24 hours:  Temp:  [98.1 F (36.7 C)-98.6 F (37 C)] 98.2 F (36.8 C) (04/25 0757) Pulse Rate:  [85-97] 89 (04/25 0757) Resp:  [16-22] 16 (04/25 0757) BP: (115-162)/(87-102) 147/95 (04/25 0757) SpO2:  [95 %-99 %] 99 % (04/25 0757)  Weight change:  Filed Weights   11/27/20 2021  Weight: 90.7 kg    Intake/Output: I/O last 3 completed shifts: In: 2285.1 [P.O.:960; I.V.:1325.1] Out: 4330 [Urine:4330]   Intake/Output this shift:  No intake/output data recorded.  Physical Exam: General: NAD, sitting in chair  Head: Normocephalic, atraumatic. Moist oral mucosal membranes  Eyes: Anicteric  Lungs:  Clear to auscultation  Heart: Regular rate and rhythm  Abdomen:  Soft, nontender,   Extremities:  no peripheral edema.  Neurologic: Nonfocal, moving all four extremities  Skin: No lesions       Basic Metabolic Panel: Recent Labs  Lab 11/27/20 2028 11/28/20 0553 11/29/20 0545 11/30/20 0342 11/30/20 1020 11/30/20 1750 12/01/20 0126 12/02/20 0548  NA 123* 129* 126* 122* 118* 122* 125* 133*  K 3.9 3.8 3.8  --  3.6  --   --   --   CL 90* 97* 96*  --  86*  --   --   --   CO2 25 24 25   --  24  --   --   --   GLUCOSE 109* 95 105*  --  109*  --   --   --   BUN 9 6* 6*  --  6*  --   --   --   CREATININE 0.81 0.78 0.61  --  0.59*  --   --    --   CALCIUM 8.7* 8.8* 8.3*  --  8.4*  --   --   --   MG  --  1.7  --   --   --   --   --   --   PHOS  --  4.3  --   --   --   --   --   --     Liver Function Tests: Recent Labs  Lab 11/27/20 2028 11/30/20 1020  AST 27 23  ALT 30 26  ALKPHOS 68 64  BILITOT 0.6 0.8  PROT 6.2* 5.8*  ALBUMIN 3.6 3.2*   No results for input(s): LIPASE, AMYLASE in the last 168 hours. No results for input(s): AMMONIA in the last 168 hours.  CBC: Recent Labs  Lab 11/27/20 2028 11/28/20 0553 12/01/20 1040  WBC 8.1 6.8 7.4  NEUTROABS  --  4.3  --   HGB 12.7* 12.7* 11.6*  HCT 36.9* 38.7* 35.0*  MCV 84.4 83.9 84.1  PLT 320 270 270    Cardiac Enzymes: Recent Labs  Lab 11/27/20 2028  CKTOTAL 272    BNP: Invalid input(s): POCBNP  CBG: Recent Labs  Lab 12/01/20 1019  GLUCAP 183*  Microbiology: Results for orders placed or performed during the hospital encounter of 11/27/20  Resp Panel by RT-PCR (Flu A&B, Covid) Nasopharyngeal Swab     Status: None   Collection Time: 11/27/20 11:02 PM   Specimen: Nasopharyngeal Swab; Nasopharyngeal(NP) swabs in vial transport medium  Result Value Ref Range Status   SARS Coronavirus 2 by RT PCR NEGATIVE NEGATIVE Final    Comment: (NOTE) SARS-CoV-2 target nucleic acids are NOT DETECTED.  The SARS-CoV-2 RNA is generally detectable in upper respiratory specimens during the acute phase of infection. The lowest concentration of SARS-CoV-2 viral copies this assay can detect is 138 copies/mL. A negative result does not preclude SARS-Cov-2 infection and should not be used as the sole basis for treatment or other patient management decisions. A negative result may occur with  improper specimen collection/handling, submission of specimen other than nasopharyngeal swab, presence of viral mutation(s) within the areas targeted by this assay, and inadequate number of viral copies(<138 copies/mL). A negative result must be combined with clinical  observations, patient history, and epidemiological information. The expected result is Negative.  Fact Sheet for Patients:  BloggerCourse.com  Fact Sheet for Healthcare Providers:  SeriousBroker.it  This test is no t yet approved or cleared by the [place] FDA and  has been authorized for detection and/or diagnosis of SARS-CoV-2 by FDA under an Emergency Use Authorization (EUA). This EUA will remain  in effect (meaning this test can be used) for the duration of the COVID-19 declaration under Section 564(b)(1) of the Act, 21 U.S.C.section 360bbb-3(b)(1), unless the authorization is terminated  or revoked sooner.       Influenza A by PCR NEGATIVE NEGATIVE Final   Influenza B by PCR NEGATIVE NEGATIVE Final    Comment: (NOTE) The Xpert Xpress SARS-CoV-2/FLU/RSV plus assay is intended as an aid in the diagnosis of influenza from Nasopharyngeal swab specimens and should not be used as a sole basis for treatment. Nasal washings and aspirates are unacceptable for Xpert Xpress SARS-CoV-2/FLU/RSV testing.  Fact Sheet for Patients: BloggerCourse.com  Fact Sheet for Healthcare Providers: SeriousBroker.it  This test is not yet approved or cleared by the [place] FDA and has been authorized for detection and/or diagnosis of SARS-CoV-2 by FDA under an Emergency Use Authorization (EUA). This EUA will remain in effect (meaning this test can be used) for the duration of the COVID-19 declaration under Section 564(b)(1) of the Act, 21 U.S.C. section 360bbb-3(b)(1), unless the authorization is terminated or revoked.  Performed at [facility], [address] [place], [place] [address]     Coagulation Studies: No results for input(s): LABPROT, INR in the last 72 hours.  Urinalysis: No results for input(s): COLORURINE, LABSPEC, PHURINE, GLUCOSEU, HGBUR, BILIRUBINUR,  KETONESUR, PROTEINUR, UROBILINOGEN, NITRITE, LEUKOCYTESUR in the last 72 hours.  Invalid input(s): APPERANCEUR    Imaging: DG Chest Port 1 View  Result Date: 12/02/2020 CLINICAL DATA:  Increased shortness of breath for 1 hour EXAM: PORTABLE CHEST 1 VIEW COMPARISON:  Five days ago FINDINGS: Normal heart size and stable mediastinal contours. Mild streaky density bilaterally at the bases, unchanged and likely from low volumes with interstitial crowding. No Kerley lines, effusion, or pneumothorax. IMPRESSION: Stable from prior.  No evidence of acute disease. Electronically Signed   By: Marnee Spring M.D.   On: 12/02/2020 04:16     Medications:   . sodium chloride     . alum & mag hydroxide-simeth  30 mL Oral TID AC & HS  . buPROPion  100 mg  Oral Daily  . carvedilol  6.25 mg Oral BID WC  . cloZAPine  200 mg Oral QHS  . docusate sodium  100 mg Oral Daily  . dorzolamide-timolol  1 drop Both Eyes BID  . enoxaparin (LOVENOX) injection  40 mg Subcutaneous Q24H  . ezetimibe  10 mg Oral QHS  . famotidine  20 mg Oral BID  . fenofibrate  160 mg Oral Daily  . fluticasone  2 spray Each Nare Daily  . fluticasone furoate-vilanterol  1 puff Inhalation Daily  . irbesartan  75 mg Oral Daily  . isosorbide mononitrate  30 mg Oral BID  . latanoprost  1 drop Both Eyes QHS  . simvastatin  40 mg Oral QHS  . sodium chloride  1 g Oral TID WC  . tamsulosin  0.4 mg Oral Daily   acetaminophen, HYDROcodone-acetaminophen, hydrOXYzine, ipratropium-albuterol, nitroGLYCERIN, senna  Assessment/ Plan:  Brandon [name] is a 65 y.o.  male with past medical history of hypertension, hyperlipidemia, COPD, schizophrenia and coronary disease post stent.   1. Hyponatremia with history of polydipsia - Currently 133 - Successfully treated with Tolvaptan and IVF - Improved nutritional intake - Adequate urine output   Lab Results  Component Value Date   CREATININE 0.59 (L) 11/30/2020   CREATININE 0.61  11/29/2020   CREATININE 0.78 11/28/2020    Intake/Output Summary (Last 24 hours) at 12/03/2020 1001 Last data filed at 12/03/2020 0053 Gross per 24 hour  Intake 960 ml  Output 1750 ml  Net -790 ml   2. Hypertension: urgency on admission, poor control history - home regimen includes Entresto, metoprolol, ol,esartan, carvedilol, isosorbide mononitrate and tamsulosin.  - ECHO- EF 55-60%    LOS: 6   4/25/202210:01 AM

## 2020-12-04 DIAGNOSIS — F32A Depression, unspecified: Secondary | ICD-10-CM | POA: Diagnosis not present

## 2020-12-04 DIAGNOSIS — E119 Type 2 diabetes mellitus without complications: Secondary | ICD-10-CM | POA: Diagnosis not present

## 2020-12-04 DIAGNOSIS — J449 Chronic obstructive pulmonary disease, unspecified: Secondary | ICD-10-CM | POA: Diagnosis not present

## 2020-12-04 DIAGNOSIS — E785 Hyperlipidemia, unspecified: Secondary | ICD-10-CM | POA: Diagnosis not present

## 2020-12-04 DIAGNOSIS — E871 Hypo-osmolality and hyponatremia: Secondary | ICD-10-CM | POA: Diagnosis not present

## 2020-12-04 DIAGNOSIS — I10 Essential (primary) hypertension: Secondary | ICD-10-CM | POA: Diagnosis not present

## 2020-12-04 DIAGNOSIS — I2510 Atherosclerotic heart disease of native coronary artery without angina pectoris: Secondary | ICD-10-CM | POA: Diagnosis not present

## 2020-12-04 DIAGNOSIS — K2100 Gastro-esophageal reflux disease with esophagitis, without bleeding: Secondary | ICD-10-CM | POA: Diagnosis not present

## 2020-12-04 DIAGNOSIS — E860 Dehydration: Secondary | ICD-10-CM | POA: Diagnosis not present

## 2020-12-06 DIAGNOSIS — D649 Anemia, unspecified: Secondary | ICD-10-CM | POA: Diagnosis not present

## 2020-12-10 ENCOUNTER — Encounter: Payer: Self-pay | Admitting: Internal Medicine

## 2020-12-10 DIAGNOSIS — J449 Chronic obstructive pulmonary disease, unspecified: Secondary | ICD-10-CM | POA: Diagnosis not present

## 2020-12-10 DIAGNOSIS — F32A Depression, unspecified: Secondary | ICD-10-CM | POA: Diagnosis not present

## 2020-12-10 DIAGNOSIS — I959 Hypotension, unspecified: Secondary | ICD-10-CM | POA: Diagnosis not present

## 2020-12-11 ENCOUNTER — Ambulatory Visit: Payer: Medicare Other | Admitting: Podiatry

## 2020-12-12 DIAGNOSIS — F32A Depression, unspecified: Secondary | ICD-10-CM | POA: Diagnosis not present

## 2020-12-12 DIAGNOSIS — I959 Hypotension, unspecified: Secondary | ICD-10-CM | POA: Diagnosis not present

## 2020-12-12 DIAGNOSIS — R0602 Shortness of breath: Secondary | ICD-10-CM | POA: Diagnosis not present

## 2020-12-12 DIAGNOSIS — J449 Chronic obstructive pulmonary disease, unspecified: Secondary | ICD-10-CM | POA: Diagnosis not present

## 2020-12-13 DIAGNOSIS — J449 Chronic obstructive pulmonary disease, unspecified: Secondary | ICD-10-CM | POA: Diagnosis not present

## 2020-12-18 DIAGNOSIS — I959 Hypotension, unspecified: Secondary | ICD-10-CM | POA: Diagnosis not present

## 2020-12-18 DIAGNOSIS — E871 Hypo-osmolality and hyponatremia: Secondary | ICD-10-CM | POA: Diagnosis not present

## 2020-12-18 DIAGNOSIS — J449 Chronic obstructive pulmonary disease, unspecified: Secondary | ICD-10-CM | POA: Diagnosis not present

## 2020-12-18 DIAGNOSIS — I2510 Atherosclerotic heart disease of native coronary artery without angina pectoris: Secondary | ICD-10-CM | POA: Diagnosis not present

## 2020-12-21 DIAGNOSIS — I2510 Atherosclerotic heart disease of native coronary artery without angina pectoris: Secondary | ICD-10-CM | POA: Diagnosis not present

## 2020-12-21 DIAGNOSIS — I509 Heart failure, unspecified: Secondary | ICD-10-CM | POA: Diagnosis not present

## 2020-12-21 DIAGNOSIS — I10 Essential (primary) hypertension: Secondary | ICD-10-CM | POA: Diagnosis not present

## 2020-12-21 DIAGNOSIS — E871 Hypo-osmolality and hyponatremia: Secondary | ICD-10-CM | POA: Diagnosis not present

## 2020-12-21 DIAGNOSIS — E785 Hyperlipidemia, unspecified: Secondary | ICD-10-CM | POA: Diagnosis not present

## 2020-12-25 ENCOUNTER — Ambulatory Visit (INDEPENDENT_AMBULATORY_CARE_PROVIDER_SITE_OTHER): Payer: Medicare Other | Admitting: Podiatry

## 2020-12-25 ENCOUNTER — Other Ambulatory Visit: Payer: Self-pay

## 2020-12-25 ENCOUNTER — Encounter: Payer: Self-pay | Admitting: Podiatry

## 2020-12-25 DIAGNOSIS — L989 Disorder of the skin and subcutaneous tissue, unspecified: Secondary | ICD-10-CM | POA: Diagnosis not present

## 2020-12-25 NOTE — Progress Notes (Signed)
   Subjective: 65 y.o. male presenting to the office today for recurrence of symptomatic callus to the left plantar forefoot.  Patient states that he continues to have some pain and sensitivity to the area.  He presents for further treatment evaluation   Past Medical History:  Diagnosis Date  . Anemia    IDA  . Colon polyps   . COPD (chronic obstructive pulmonary disease) (HCC)   . Coronary artery disease   . Heart attack (HCC) 2005  . Hiatal hernia   . History of ETOH abuse   . Hyperlipidemia   . Hypertension   . Pleurisy   . Schizophrenia (HCC)   . Schizophrenia (HCC)   . Seizures (HCC)    grand mal in 2000 or 2001 recovery from alcoholism  . Suicide attempt [facility])      Objective:  Physical Exam General: Alert and oriented x3 in no acute distress  Dermatology: Hyperkeratotic lesion(s) present on the plantar aspect of the left forefoot. Pain on palpation with a central nucleated core noted. Skin is warm, dry and supple bilateral lower extremities. Negative for open lesions or macerations.  Vascular: Palpable pedal pulses bilaterally. No edema or erythema noted. Capillary refill within normal limits.  Neurological: Epicritic and protective threshold grossly intact bilaterally.   Musculoskeletal Exam: Pain on palpation at the keratotic lesion(s) noted. Range of motion within normal limits bilateral. Muscle strength 5/5 in all groups bilateral.  Assessment: 1.  Symptomatic preulcerative callus left forefoot   Plan of Care:  1. Patient evaluated 2. Excisional debridement of keratoic lesion(s) using a chisel blade was performed without incident.  3. Dressed area with light dressing. 4.  Continue offloading felt metatarsal pads that were applied to the insoles of the shoes  5.  Patient is to return to the clinic PRN.   Felecia Shelling, DPM [facility]  Dr. Felecia Shelling, DPM    [address] [place], [place] [address]                 Office [phone]  Fax [phone]

## 2020-12-27 DIAGNOSIS — I959 Hypotension, unspecified: Secondary | ICD-10-CM | POA: Diagnosis not present

## 2020-12-27 DIAGNOSIS — Z87891 Personal history of nicotine dependence: Secondary | ICD-10-CM | POA: Diagnosis not present

## 2020-12-27 DIAGNOSIS — D649 Anemia, unspecified: Secondary | ICD-10-CM | POA: Diagnosis not present

## 2020-12-27 DIAGNOSIS — E860 Dehydration: Secondary | ICD-10-CM | POA: Diagnosis not present

## 2020-12-27 DIAGNOSIS — E785 Hyperlipidemia, unspecified: Secondary | ICD-10-CM | POA: Diagnosis not present

## 2020-12-27 DIAGNOSIS — J449 Chronic obstructive pulmonary disease, unspecified: Secondary | ICD-10-CM | POA: Diagnosis not present

## 2020-12-27 DIAGNOSIS — I2510 Atherosclerotic heart disease of native coronary artery without angina pectoris: Secondary | ICD-10-CM | POA: Diagnosis not present

## 2020-12-27 DIAGNOSIS — I10 Essential (primary) hypertension: Secondary | ICD-10-CM | POA: Diagnosis not present

## 2020-12-27 DIAGNOSIS — E871 Hypo-osmolality and hyponatremia: Secondary | ICD-10-CM | POA: Diagnosis not present

## 2020-12-28 DIAGNOSIS — J449 Chronic obstructive pulmonary disease, unspecified: Secondary | ICD-10-CM | POA: Diagnosis not present

## 2020-12-28 DIAGNOSIS — I10 Essential (primary) hypertension: Secondary | ICD-10-CM | POA: Diagnosis not present

## 2020-12-28 DIAGNOSIS — I2510 Atherosclerotic heart disease of native coronary artery without angina pectoris: Secondary | ICD-10-CM | POA: Diagnosis not present

## 2020-12-28 DIAGNOSIS — E860 Dehydration: Secondary | ICD-10-CM | POA: Diagnosis not present

## 2020-12-28 DIAGNOSIS — E871 Hypo-osmolality and hyponatremia: Secondary | ICD-10-CM | POA: Diagnosis not present

## 2020-12-28 DIAGNOSIS — I959 Hypotension, unspecified: Secondary | ICD-10-CM | POA: Diagnosis not present

## 2020-12-28 DIAGNOSIS — Z87891 Personal history of nicotine dependence: Secondary | ICD-10-CM | POA: Diagnosis not present

## 2020-12-28 DIAGNOSIS — D649 Anemia, unspecified: Secondary | ICD-10-CM | POA: Diagnosis not present

## 2020-12-28 DIAGNOSIS — E785 Hyperlipidemia, unspecified: Secondary | ICD-10-CM | POA: Diagnosis not present

## 2021-01-02 DIAGNOSIS — D649 Anemia, unspecified: Secondary | ICD-10-CM | POA: Diagnosis not present

## 2021-01-02 DIAGNOSIS — J449 Chronic obstructive pulmonary disease, unspecified: Secondary | ICD-10-CM | POA: Diagnosis not present

## 2021-01-02 DIAGNOSIS — E860 Dehydration: Secondary | ICD-10-CM | POA: Diagnosis not present

## 2021-01-02 DIAGNOSIS — I2510 Atherosclerotic heart disease of native coronary artery without angina pectoris: Secondary | ICD-10-CM | POA: Diagnosis not present

## 2021-01-02 DIAGNOSIS — E785 Hyperlipidemia, unspecified: Secondary | ICD-10-CM | POA: Diagnosis not present

## 2021-01-02 DIAGNOSIS — I959 Hypotension, unspecified: Secondary | ICD-10-CM | POA: Diagnosis not present

## 2021-01-02 DIAGNOSIS — E871 Hypo-osmolality and hyponatremia: Secondary | ICD-10-CM | POA: Diagnosis not present

## 2021-01-02 DIAGNOSIS — I10 Essential (primary) hypertension: Secondary | ICD-10-CM | POA: Diagnosis not present

## 2021-01-02 DIAGNOSIS — Z87891 Personal history of nicotine dependence: Secondary | ICD-10-CM | POA: Diagnosis not present

## 2021-01-03 DIAGNOSIS — E785 Hyperlipidemia, unspecified: Secondary | ICD-10-CM | POA: Diagnosis not present

## 2021-01-03 DIAGNOSIS — I959 Hypotension, unspecified: Secondary | ICD-10-CM | POA: Diagnosis not present

## 2021-01-03 DIAGNOSIS — I2510 Atherosclerotic heart disease of native coronary artery without angina pectoris: Secondary | ICD-10-CM | POA: Diagnosis not present

## 2021-01-03 DIAGNOSIS — E860 Dehydration: Secondary | ICD-10-CM | POA: Diagnosis not present

## 2021-01-03 DIAGNOSIS — E871 Hypo-osmolality and hyponatremia: Secondary | ICD-10-CM | POA: Diagnosis not present

## 2021-01-03 DIAGNOSIS — J449 Chronic obstructive pulmonary disease, unspecified: Secondary | ICD-10-CM | POA: Diagnosis not present

## 2021-01-03 DIAGNOSIS — D649 Anemia, unspecified: Secondary | ICD-10-CM | POA: Diagnosis not present

## 2021-01-03 DIAGNOSIS — Z87891 Personal history of nicotine dependence: Secondary | ICD-10-CM | POA: Diagnosis not present

## 2021-01-03 DIAGNOSIS — I10 Essential (primary) hypertension: Secondary | ICD-10-CM | POA: Diagnosis not present

## 2021-01-09 DIAGNOSIS — I959 Hypotension, unspecified: Secondary | ICD-10-CM | POA: Diagnosis not present

## 2021-01-09 DIAGNOSIS — E785 Hyperlipidemia, unspecified: Secondary | ICD-10-CM | POA: Diagnosis not present

## 2021-01-09 DIAGNOSIS — E871 Hypo-osmolality and hyponatremia: Secondary | ICD-10-CM | POA: Diagnosis not present

## 2021-01-09 DIAGNOSIS — I2510 Atherosclerotic heart disease of native coronary artery without angina pectoris: Secondary | ICD-10-CM | POA: Diagnosis not present

## 2021-01-09 DIAGNOSIS — J449 Chronic obstructive pulmonary disease, unspecified: Secondary | ICD-10-CM | POA: Diagnosis not present

## 2021-01-09 DIAGNOSIS — D649 Anemia, unspecified: Secondary | ICD-10-CM | POA: Diagnosis not present

## 2021-01-09 DIAGNOSIS — E860 Dehydration: Secondary | ICD-10-CM | POA: Diagnosis not present

## 2021-01-09 DIAGNOSIS — I10 Essential (primary) hypertension: Secondary | ICD-10-CM | POA: Diagnosis not present

## 2021-01-09 DIAGNOSIS — H524 Presbyopia: Secondary | ICD-10-CM | POA: Diagnosis not present

## 2021-01-09 DIAGNOSIS — Z87891 Personal history of nicotine dependence: Secondary | ICD-10-CM | POA: Diagnosis not present

## 2021-01-11 DIAGNOSIS — I959 Hypotension, unspecified: Secondary | ICD-10-CM | POA: Diagnosis not present

## 2021-01-11 DIAGNOSIS — I10 Essential (primary) hypertension: Secondary | ICD-10-CM | POA: Diagnosis not present

## 2021-01-11 DIAGNOSIS — Z87891 Personal history of nicotine dependence: Secondary | ICD-10-CM | POA: Diagnosis not present

## 2021-01-11 DIAGNOSIS — E871 Hypo-osmolality and hyponatremia: Secondary | ICD-10-CM | POA: Diagnosis not present

## 2021-01-11 DIAGNOSIS — I2510 Atherosclerotic heart disease of native coronary artery without angina pectoris: Secondary | ICD-10-CM | POA: Diagnosis not present

## 2021-01-11 DIAGNOSIS — E860 Dehydration: Secondary | ICD-10-CM | POA: Diagnosis not present

## 2021-01-11 DIAGNOSIS — D649 Anemia, unspecified: Secondary | ICD-10-CM | POA: Diagnosis not present

## 2021-01-11 DIAGNOSIS — E785 Hyperlipidemia, unspecified: Secondary | ICD-10-CM | POA: Diagnosis not present

## 2021-01-11 DIAGNOSIS — J449 Chronic obstructive pulmonary disease, unspecified: Secondary | ICD-10-CM | POA: Diagnosis not present

## 2021-01-16 DIAGNOSIS — I10 Essential (primary) hypertension: Secondary | ICD-10-CM | POA: Diagnosis not present

## 2021-01-16 DIAGNOSIS — E785 Hyperlipidemia, unspecified: Secondary | ICD-10-CM | POA: Diagnosis not present

## 2021-01-17 DIAGNOSIS — I10 Essential (primary) hypertension: Secondary | ICD-10-CM | POA: Diagnosis not present

## 2021-01-17 DIAGNOSIS — E785 Hyperlipidemia, unspecified: Secondary | ICD-10-CM | POA: Diagnosis not present

## 2021-01-17 DIAGNOSIS — D649 Anemia, unspecified: Secondary | ICD-10-CM | POA: Diagnosis not present

## 2021-01-17 DIAGNOSIS — E871 Hypo-osmolality and hyponatremia: Secondary | ICD-10-CM | POA: Diagnosis not present

## 2021-01-17 DIAGNOSIS — I959 Hypotension, unspecified: Secondary | ICD-10-CM | POA: Diagnosis not present

## 2021-01-17 DIAGNOSIS — J449 Chronic obstructive pulmonary disease, unspecified: Secondary | ICD-10-CM | POA: Diagnosis not present

## 2021-01-17 DIAGNOSIS — E860 Dehydration: Secondary | ICD-10-CM | POA: Diagnosis not present

## 2021-01-17 DIAGNOSIS — Z87891 Personal history of nicotine dependence: Secondary | ICD-10-CM | POA: Diagnosis not present

## 2021-01-17 DIAGNOSIS — I2510 Atherosclerotic heart disease of native coronary artery without angina pectoris: Secondary | ICD-10-CM | POA: Diagnosis not present

## 2021-01-18 DIAGNOSIS — J449 Chronic obstructive pulmonary disease, unspecified: Secondary | ICD-10-CM | POA: Diagnosis not present

## 2021-01-18 DIAGNOSIS — E785 Hyperlipidemia, unspecified: Secondary | ICD-10-CM | POA: Diagnosis not present

## 2021-01-18 DIAGNOSIS — R0602 Shortness of breath: Secondary | ICD-10-CM | POA: Diagnosis not present

## 2021-01-18 DIAGNOSIS — I70219 Atherosclerosis of native arteries of extremities with intermittent claudication, unspecified extremity: Secondary | ICD-10-CM | POA: Diagnosis not present

## 2021-01-18 DIAGNOSIS — M79673 Pain in unspecified foot: Secondary | ICD-10-CM | POA: Diagnosis not present

## 2021-01-18 DIAGNOSIS — I10 Essential (primary) hypertension: Secondary | ICD-10-CM | POA: Diagnosis not present

## 2021-01-18 DIAGNOSIS — I2510 Atherosclerotic heart disease of native coronary artery without angina pectoris: Secondary | ICD-10-CM | POA: Diagnosis not present

## 2021-01-18 DIAGNOSIS — I255 Ischemic cardiomyopathy: Secondary | ICD-10-CM | POA: Diagnosis not present

## 2021-01-18 DIAGNOSIS — R5383 Other fatigue: Secondary | ICD-10-CM | POA: Diagnosis not present

## 2021-01-18 DIAGNOSIS — N182 Chronic kidney disease, stage 2 (mild): Secondary | ICD-10-CM | POA: Diagnosis not present

## 2021-01-18 DIAGNOSIS — R569 Unspecified convulsions: Secondary | ICD-10-CM | POA: Diagnosis not present

## 2021-01-18 DIAGNOSIS — E871 Hypo-osmolality and hyponatremia: Secondary | ICD-10-CM | POA: Diagnosis not present

## 2021-01-21 DIAGNOSIS — E871 Hypo-osmolality and hyponatremia: Secondary | ICD-10-CM | POA: Diagnosis not present

## 2021-01-21 DIAGNOSIS — E860 Dehydration: Secondary | ICD-10-CM | POA: Diagnosis not present

## 2021-01-21 DIAGNOSIS — I2510 Atherosclerotic heart disease of native coronary artery without angina pectoris: Secondary | ICD-10-CM | POA: Diagnosis not present

## 2021-01-21 DIAGNOSIS — I959 Hypotension, unspecified: Secondary | ICD-10-CM | POA: Diagnosis not present

## 2021-01-21 DIAGNOSIS — Z87891 Personal history of nicotine dependence: Secondary | ICD-10-CM | POA: Diagnosis not present

## 2021-01-21 DIAGNOSIS — E785 Hyperlipidemia, unspecified: Secondary | ICD-10-CM | POA: Diagnosis not present

## 2021-01-21 DIAGNOSIS — J449 Chronic obstructive pulmonary disease, unspecified: Secondary | ICD-10-CM | POA: Diagnosis not present

## 2021-01-21 DIAGNOSIS — I10 Essential (primary) hypertension: Secondary | ICD-10-CM | POA: Diagnosis not present

## 2021-01-21 DIAGNOSIS — D649 Anemia, unspecified: Secondary | ICD-10-CM | POA: Diagnosis not present

## 2021-01-30 DIAGNOSIS — E785 Hyperlipidemia, unspecified: Secondary | ICD-10-CM | POA: Diagnosis not present

## 2021-01-30 DIAGNOSIS — E860 Dehydration: Secondary | ICD-10-CM | POA: Diagnosis not present

## 2021-01-30 DIAGNOSIS — I959 Hypotension, unspecified: Secondary | ICD-10-CM | POA: Diagnosis not present

## 2021-01-30 DIAGNOSIS — S022XXA Fracture of nasal bones, initial encounter for closed fracture: Secondary | ICD-10-CM | POA: Diagnosis not present

## 2021-01-30 DIAGNOSIS — I10 Essential (primary) hypertension: Secondary | ICD-10-CM | POA: Diagnosis not present

## 2021-01-30 DIAGNOSIS — J342 Deviated nasal septum: Secondary | ICD-10-CM | POA: Diagnosis not present

## 2021-01-30 DIAGNOSIS — I2510 Atherosclerotic heart disease of native coronary artery without angina pectoris: Secondary | ICD-10-CM | POA: Diagnosis not present

## 2021-01-30 DIAGNOSIS — D649 Anemia, unspecified: Secondary | ICD-10-CM | POA: Diagnosis not present

## 2021-01-30 DIAGNOSIS — E871 Hypo-osmolality and hyponatremia: Secondary | ICD-10-CM | POA: Diagnosis not present

## 2021-01-30 DIAGNOSIS — J449 Chronic obstructive pulmonary disease, unspecified: Secondary | ICD-10-CM | POA: Diagnosis not present

## 2021-01-30 DIAGNOSIS — Z87891 Personal history of nicotine dependence: Secondary | ICD-10-CM | POA: Diagnosis not present

## 2021-01-31 DIAGNOSIS — E871 Hypo-osmolality and hyponatremia: Secondary | ICD-10-CM | POA: Diagnosis not present

## 2021-01-31 DIAGNOSIS — I2510 Atherosclerotic heart disease of native coronary artery without angina pectoris: Secondary | ICD-10-CM | POA: Diagnosis not present

## 2021-01-31 DIAGNOSIS — J449 Chronic obstructive pulmonary disease, unspecified: Secondary | ICD-10-CM | POA: Diagnosis not present

## 2021-01-31 DIAGNOSIS — I10 Essential (primary) hypertension: Secondary | ICD-10-CM | POA: Diagnosis not present

## 2021-01-31 DIAGNOSIS — I959 Hypotension, unspecified: Secondary | ICD-10-CM | POA: Diagnosis not present

## 2021-01-31 DIAGNOSIS — Z87891 Personal history of nicotine dependence: Secondary | ICD-10-CM | POA: Diagnosis not present

## 2021-01-31 DIAGNOSIS — E785 Hyperlipidemia, unspecified: Secondary | ICD-10-CM | POA: Diagnosis not present

## 2021-01-31 DIAGNOSIS — D649 Anemia, unspecified: Secondary | ICD-10-CM | POA: Diagnosis not present

## 2021-01-31 DIAGNOSIS — E860 Dehydration: Secondary | ICD-10-CM | POA: Diagnosis not present

## 2021-02-05 DIAGNOSIS — J449 Chronic obstructive pulmonary disease, unspecified: Secondary | ICD-10-CM | POA: Diagnosis not present

## 2021-02-05 DIAGNOSIS — E860 Dehydration: Secondary | ICD-10-CM | POA: Diagnosis not present

## 2021-02-05 DIAGNOSIS — D649 Anemia, unspecified: Secondary | ICD-10-CM | POA: Diagnosis not present

## 2021-02-05 DIAGNOSIS — E785 Hyperlipidemia, unspecified: Secondary | ICD-10-CM | POA: Diagnosis not present

## 2021-02-05 DIAGNOSIS — I2510 Atherosclerotic heart disease of native coronary artery without angina pectoris: Secondary | ICD-10-CM | POA: Diagnosis not present

## 2021-02-05 DIAGNOSIS — I10 Essential (primary) hypertension: Secondary | ICD-10-CM | POA: Diagnosis not present

## 2021-02-05 DIAGNOSIS — I959 Hypotension, unspecified: Secondary | ICD-10-CM | POA: Diagnosis not present

## 2021-02-05 DIAGNOSIS — E871 Hypo-osmolality and hyponatremia: Secondary | ICD-10-CM | POA: Diagnosis not present

## 2021-02-05 DIAGNOSIS — Z87891 Personal history of nicotine dependence: Secondary | ICD-10-CM | POA: Diagnosis not present

## 2021-02-15 DIAGNOSIS — J449 Chronic obstructive pulmonary disease, unspecified: Secondary | ICD-10-CM | POA: Diagnosis not present

## 2021-02-15 DIAGNOSIS — I10 Essential (primary) hypertension: Secondary | ICD-10-CM | POA: Diagnosis not present

## 2021-02-15 DIAGNOSIS — I2510 Atherosclerotic heart disease of native coronary artery without angina pectoris: Secondary | ICD-10-CM | POA: Diagnosis not present

## 2021-02-15 DIAGNOSIS — D649 Anemia, unspecified: Secondary | ICD-10-CM | POA: Diagnosis not present

## 2021-02-15 DIAGNOSIS — E860 Dehydration: Secondary | ICD-10-CM | POA: Diagnosis not present

## 2021-02-15 DIAGNOSIS — Z87891 Personal history of nicotine dependence: Secondary | ICD-10-CM | POA: Diagnosis not present

## 2021-02-15 DIAGNOSIS — I959 Hypotension, unspecified: Secondary | ICD-10-CM | POA: Diagnosis not present

## 2021-02-15 DIAGNOSIS — E871 Hypo-osmolality and hyponatremia: Secondary | ICD-10-CM | POA: Diagnosis not present

## 2021-02-15 DIAGNOSIS — E785 Hyperlipidemia, unspecified: Secondary | ICD-10-CM | POA: Diagnosis not present

## 2021-02-18 DIAGNOSIS — E860 Dehydration: Secondary | ICD-10-CM | POA: Diagnosis not present

## 2021-02-18 DIAGNOSIS — D649 Anemia, unspecified: Secondary | ICD-10-CM | POA: Diagnosis not present

## 2021-02-18 DIAGNOSIS — I10 Essential (primary) hypertension: Secondary | ICD-10-CM | POA: Diagnosis not present

## 2021-02-18 DIAGNOSIS — E871 Hypo-osmolality and hyponatremia: Secondary | ICD-10-CM | POA: Diagnosis not present

## 2021-02-18 DIAGNOSIS — J449 Chronic obstructive pulmonary disease, unspecified: Secondary | ICD-10-CM | POA: Diagnosis not present

## 2021-02-18 DIAGNOSIS — E785 Hyperlipidemia, unspecified: Secondary | ICD-10-CM | POA: Diagnosis not present

## 2021-02-18 DIAGNOSIS — I2510 Atherosclerotic heart disease of native coronary artery without angina pectoris: Secondary | ICD-10-CM | POA: Diagnosis not present

## 2021-02-18 DIAGNOSIS — Z87891 Personal history of nicotine dependence: Secondary | ICD-10-CM | POA: Diagnosis not present

## 2021-02-18 DIAGNOSIS — I959 Hypotension, unspecified: Secondary | ICD-10-CM | POA: Diagnosis not present

## 2021-02-19 ENCOUNTER — Ambulatory Visit (INDEPENDENT_AMBULATORY_CARE_PROVIDER_SITE_OTHER): Payer: Medicare Other | Admitting: Podiatry

## 2021-02-19 DIAGNOSIS — L989 Disorder of the skin and subcutaneous tissue, unspecified: Secondary | ICD-10-CM | POA: Diagnosis not present

## 2021-02-19 MED ORDER — TRAMADOL HCL 50 MG PO TABS: 50.0000 mg | ORAL_TABLET | Freq: Four times a day (QID) | ORAL | 0 refills | Status: AC | PRN

## 2021-02-19 NOTE — Progress Notes (Signed)
   Subjective: 65 y.o. male presenting to the office today for recurrence of symptomatic callus to the left plantar forefoot.  Patient states that he continues to have some pain and sensitivity to the area.  He presents for further treatment evaluation   Past Medical History:  Diagnosis Date   Anemia    IDA   Colon polyps    COPD (chronic obstructive pulmonary disease) (HCC)    Coronary artery disease    Heart attack (HCC) 2005   Hiatal hernia    History of ETOH abuse    Hyperlipidemia    Hypertension    Pleurisy    Schizophrenia (HCC)    Schizophrenia (HCC)    Seizures (HCC)    grand mal in 2000 or 2001 recovery from alcoholism   Suicide attempt (HCC)      Objective:  Physical Exam General: Alert and oriented x3 in no acute distress  Dermatology: Hyperkeratotic lesion(s) present on the plantar aspect of the left forefoot. Pain on palpation with a central nucleated core noted. Skin is warm, dry and supple bilateral lower extremities. Negative for open lesions or macerations.  Vascular: Palpable pedal pulses bilaterally. No edema or erythema noted. Capillary refill within normal limits.  Neurological: Epicritic and protective threshold grossly intact bilaterally.   Musculoskeletal Exam: Pain on palpation at the keratotic lesion(s) noted. Range of motion within normal limits bilateral. Muscle strength 5/5 in all groups bilateral.  Assessment: 1.  Symptomatic preulcerative callus left forefoot   Plan of Care:  1. Patient evaluated 2. Excisional debridement of keratoic lesion(s) using a chisel blade was performed without incident.  3. Dressed area with light dressing. 4.  Continue offloading felt metatarsal pads that were applied to the insoles of the shoes  5.  Prescription for tramadol 50 mg every 6 hours #30  6.  Patient is to return to the clinic PRN.   Felecia Shelling, DPM [facility]  Dr. Felecia Shelling, DPM    [address] [place], [place] [address]                Office [phone]  Fax [phone]

## 2021-02-20 DIAGNOSIS — J449 Chronic obstructive pulmonary disease, unspecified: Secondary | ICD-10-CM | POA: Diagnosis not present

## 2021-02-20 DIAGNOSIS — E871 Hypo-osmolality and hyponatremia: Secondary | ICD-10-CM | POA: Diagnosis not present

## 2021-02-20 DIAGNOSIS — Z87891 Personal history of nicotine dependence: Secondary | ICD-10-CM | POA: Diagnosis not present

## 2021-02-20 DIAGNOSIS — I2510 Atherosclerotic heart disease of native coronary artery without angina pectoris: Secondary | ICD-10-CM | POA: Diagnosis not present

## 2021-02-20 DIAGNOSIS — I959 Hypotension, unspecified: Secondary | ICD-10-CM | POA: Diagnosis not present

## 2021-02-20 DIAGNOSIS — D649 Anemia, unspecified: Secondary | ICD-10-CM | POA: Diagnosis not present

## 2021-02-20 DIAGNOSIS — I10 Essential (primary) hypertension: Secondary | ICD-10-CM | POA: Diagnosis not present

## 2021-02-20 DIAGNOSIS — E785 Hyperlipidemia, unspecified: Secondary | ICD-10-CM | POA: Diagnosis not present

## 2021-02-20 DIAGNOSIS — E860 Dehydration: Secondary | ICD-10-CM | POA: Diagnosis not present

## 2021-02-22 ENCOUNTER — Ambulatory Visit: Payer: Medicare Other | Admitting: Podiatry

## 2021-03-05 DIAGNOSIS — I70219 Atherosclerosis of native arteries of extremities with intermittent claudication, unspecified extremity: Secondary | ICD-10-CM | POA: Diagnosis not present

## 2021-03-05 DIAGNOSIS — K219 Gastro-esophageal reflux disease without esophagitis: Secondary | ICD-10-CM | POA: Diagnosis not present

## 2021-03-05 DIAGNOSIS — E785 Hyperlipidemia, unspecified: Secondary | ICD-10-CM | POA: Diagnosis not present

## 2021-03-05 DIAGNOSIS — J449 Chronic obstructive pulmonary disease, unspecified: Secondary | ICD-10-CM | POA: Diagnosis not present

## 2021-03-05 DIAGNOSIS — R5383 Other fatigue: Secondary | ICD-10-CM | POA: Diagnosis not present

## 2021-03-05 DIAGNOSIS — N182 Chronic kidney disease, stage 2 (mild): Secondary | ICD-10-CM | POA: Diagnosis not present

## 2021-03-05 DIAGNOSIS — E871 Hypo-osmolality and hyponatremia: Secondary | ICD-10-CM | POA: Diagnosis not present

## 2021-03-05 DIAGNOSIS — I10 Essential (primary) hypertension: Secondary | ICD-10-CM | POA: Diagnosis not present

## 2021-03-05 DIAGNOSIS — M79673 Pain in unspecified foot: Secondary | ICD-10-CM | POA: Diagnosis not present

## 2021-03-15 DIAGNOSIS — M170 Bilateral primary osteoarthritis of knee: Secondary | ICD-10-CM | POA: Diagnosis not present

## 2021-03-19 DIAGNOSIS — M6281 Muscle weakness (generalized): Secondary | ICD-10-CM | POA: Diagnosis not present

## 2021-03-19 DIAGNOSIS — R269 Unspecified abnormalities of gait and mobility: Secondary | ICD-10-CM | POA: Diagnosis not present

## 2021-03-22 ENCOUNTER — Inpatient Hospital Stay
Admission: EM | Admit: 2021-03-22 | Discharge: 2021-03-25 | DRG: 641 | Disposition: A | Discharge status: Skilled Nursing Facility | Payer: Medicare Other | Attending: Internal Medicine | Admitting: Internal Medicine

## 2021-03-22 ENCOUNTER — Emergency Department: Payer: Medicare Other

## 2021-03-22 ENCOUNTER — Other Ambulatory Visit: Payer: Self-pay

## 2021-03-22 DIAGNOSIS — Z8249 Family history of ischemic heart disease and other diseases of the circulatory system: Secondary | ICD-10-CM | POA: Diagnosis not present

## 2021-03-22 DIAGNOSIS — Z888 Allergy status to other drugs, medicaments and biological substances status: Secondary | ICD-10-CM

## 2021-03-22 DIAGNOSIS — R2681 Unsteadiness on feet: Secondary | ICD-10-CM | POA: Diagnosis not present

## 2021-03-22 DIAGNOSIS — K5900 Constipation, unspecified: Secondary | ICD-10-CM | POA: Diagnosis not present

## 2021-03-22 DIAGNOSIS — F209 Schizophrenia, unspecified: Secondary | ICD-10-CM | POA: Diagnosis not present

## 2021-03-22 DIAGNOSIS — E871 Hypo-osmolality and hyponatremia: Principal | ICD-10-CM | POA: Diagnosis present

## 2021-03-22 DIAGNOSIS — N281 Cyst of kidney, acquired: Secondary | ICD-10-CM | POA: Diagnosis not present

## 2021-03-22 DIAGNOSIS — H4010X0 Unspecified open-angle glaucoma, stage unspecified: Secondary | ICD-10-CM | POA: Diagnosis not present

## 2021-03-22 DIAGNOSIS — Z20822 Contact with and (suspected) exposure to covid-19: Secondary | ICD-10-CM | POA: Diagnosis present

## 2021-03-22 DIAGNOSIS — Z955 Presence of coronary angioplasty implant and graft: Secondary | ICD-10-CM | POA: Diagnosis not present

## 2021-03-22 DIAGNOSIS — R631 Polydipsia: Secondary | ICD-10-CM | POA: Diagnosis not present

## 2021-03-22 DIAGNOSIS — N3289 Other specified disorders of bladder: Secondary | ICD-10-CM | POA: Diagnosis not present

## 2021-03-22 DIAGNOSIS — Z801 Family history of malignant neoplasm of trachea, bronchus and lung: Secondary | ICD-10-CM

## 2021-03-22 DIAGNOSIS — R5381 Other malaise: Secondary | ICD-10-CM | POA: Diagnosis not present

## 2021-03-22 DIAGNOSIS — R531 Weakness: Secondary | ICD-10-CM | POA: Diagnosis not present

## 2021-03-22 DIAGNOSIS — F250 Schizoaffective disorder, bipolar type: Secondary | ICD-10-CM | POA: Diagnosis present

## 2021-03-22 DIAGNOSIS — E785 Hyperlipidemia, unspecified: Secondary | ICD-10-CM | POA: Diagnosis not present

## 2021-03-22 DIAGNOSIS — E559 Vitamin D deficiency, unspecified: Secondary | ICD-10-CM | POA: Diagnosis not present

## 2021-03-22 DIAGNOSIS — I10 Essential (primary) hypertension: Secondary | ICD-10-CM

## 2021-03-22 DIAGNOSIS — I4891 Unspecified atrial fibrillation: Secondary | ICD-10-CM | POA: Diagnosis not present

## 2021-03-22 DIAGNOSIS — J9601 Acute respiratory failure with hypoxia: Secondary | ICD-10-CM | POA: Diagnosis not present

## 2021-03-22 DIAGNOSIS — J449 Chronic obstructive pulmonary disease, unspecified: Secondary | ICD-10-CM | POA: Diagnosis not present

## 2021-03-22 DIAGNOSIS — N400 Enlarged prostate without lower urinary tract symptoms: Secondary | ICD-10-CM | POA: Diagnosis present

## 2021-03-22 DIAGNOSIS — M6281 Muscle weakness (generalized): Secondary | ICD-10-CM | POA: Diagnosis not present

## 2021-03-22 DIAGNOSIS — R0602 Shortness of breath: Secondary | ICD-10-CM | POA: Diagnosis not present

## 2021-03-22 DIAGNOSIS — J181 Lobar pneumonia, unspecified organism: Secondary | ICD-10-CM | POA: Diagnosis not present

## 2021-03-22 DIAGNOSIS — R1013 Epigastric pain: Secondary | ICD-10-CM

## 2021-03-22 DIAGNOSIS — Z91041 Radiographic dye allergy status: Secondary | ICD-10-CM

## 2021-03-22 DIAGNOSIS — I25118 Atherosclerotic heart disease of native coronary artery with other forms of angina pectoris: Secondary | ICD-10-CM

## 2021-03-22 DIAGNOSIS — D509 Iron deficiency anemia, unspecified: Secondary | ICD-10-CM | POA: Diagnosis not present

## 2021-03-22 DIAGNOSIS — Z8616 Personal history of COVID-19: Secondary | ICD-10-CM | POA: Diagnosis not present

## 2021-03-22 DIAGNOSIS — Z87891 Personal history of nicotine dependence: Secondary | ICD-10-CM | POA: Diagnosis not present

## 2021-03-22 DIAGNOSIS — I209 Angina pectoris, unspecified: Secondary | ICD-10-CM | POA: Diagnosis not present

## 2021-03-22 DIAGNOSIS — I2510 Atherosclerotic heart disease of native coronary artery without angina pectoris: Secondary | ICD-10-CM | POA: Diagnosis not present

## 2021-03-22 DIAGNOSIS — K7689 Other specified diseases of liver: Secondary | ICD-10-CM | POA: Diagnosis not present

## 2021-03-22 DIAGNOSIS — J690 Pneumonitis due to inhalation of food and vomit: Secondary | ICD-10-CM | POA: Diagnosis not present

## 2021-03-22 DIAGNOSIS — K2100 Gastro-esophageal reflux disease with esophagitis, without bleeding: Secondary | ICD-10-CM | POA: Diagnosis not present

## 2021-03-22 DIAGNOSIS — F32A Depression, unspecified: Secondary | ICD-10-CM | POA: Diagnosis not present

## 2021-03-22 DIAGNOSIS — K5730 Diverticulosis of large intestine without perforation or abscess without bleeding: Secondary | ICD-10-CM | POA: Diagnosis not present

## 2021-03-22 DIAGNOSIS — I517 Cardiomegaly: Secondary | ICD-10-CM | POA: Diagnosis not present

## 2021-03-22 DIAGNOSIS — R296 Repeated falls: Secondary | ICD-10-CM | POA: Diagnosis not present

## 2021-03-22 DIAGNOSIS — Z743 Need for continuous supervision: Secondary | ICD-10-CM | POA: Diagnosis not present

## 2021-03-22 LAB — BASIC METABOLIC PANEL
Anion gap: 9 (ref 5–15)
Anion gap: 7 (ref 5–15)
Anion gap: 7 (ref 5–15)
BUN: 8 mg/dL (ref 8–23)
BUN: 7 mg/dL — ABNORMAL LOW (ref 8–23)
BUN: 7 mg/dL — ABNORMAL LOW (ref 8–23)
CO2: 25 mmol/L (ref 22–32)
CO2: 26 mmol/L (ref 22–32)
CO2: 26 mmol/L (ref 22–32)
Calcium: 8.8 mg/dL — ABNORMAL LOW (ref 8.9–10.3)
Calcium: 8.4 mg/dL — ABNORMAL LOW (ref 8.9–10.3)
Calcium: 8.6 mg/dL — ABNORMAL LOW (ref 8.9–10.3)
Chloride: 87 mmol/L — ABNORMAL LOW (ref 98–111)
Chloride: 89 mmol/L — ABNORMAL LOW (ref 98–111)
Chloride: 90 mmol/L — ABNORMAL LOW (ref 98–111)
Creatinine, Ser: 0.75 mg/dL (ref 0.61–1.24)
Creatinine, Ser: 0.61 mg/dL (ref 0.61–1.24)
Creatinine, Ser: 0.72 mg/dL (ref 0.61–1.24)
GFR, Estimated: >60 mL/min (ref >60)
GFR, Estimated: >60 mL/min (ref >60)
GFR, Estimated: >60 mL/min (ref >60)
Glucose, Bld: 98 mg/dL (ref 70–99)
Glucose, Bld: 137 mg/dL — ABNORMAL HIGH (ref 70–99)
Glucose, Bld: 136 mg/dL — ABNORMAL HIGH (ref 70–99)
Potassium: 4.1 mmol/L (ref 3.5–5.1)
Potassium: 3.7 mmol/L (ref 3.5–5.1)
Potassium: 3.8 mmol/L (ref 3.5–5.1)
Sodium: 121 mmol/L — ABNORMAL LOW (ref 135–145)
Sodium: 122 mmol/L — ABNORMAL LOW (ref 135–145)
Sodium: 123 mmol/L — ABNORMAL LOW (ref 135–145)

## 2021-03-22 LAB — CBC
HCT: 37.8 % — ABNORMAL LOW (ref 39.0–52.0)
Hemoglobin: 12.8 g/dL — ABNORMAL LOW (ref 13.0–17.0)
MCH: 29 pg (ref 26.0–34.0)
MCHC: 33.9 g/dL (ref 30.0–36.0)
MCV: 85.7 fL (ref 80.0–100.0)
Platelets: 314 10*3/uL (ref 150–400)
RBC: 4.41 MIL/uL (ref 4.22–5.81)
RDW: 13.2 % (ref 11.5–15.5)
WBC: 10.2 10*3/uL (ref 4.0–10.5)
nRBC: 0 % (ref 0.0–0.2)

## 2021-03-22 LAB — URINALYSIS, COMPLETE (UACMP) WITH MICROSCOPIC
Bacteria, UA: NONE SEEN
Bilirubin Urine: NEGATIVE
Glucose, UA: NEGATIVE mg/dL
Hgb urine dipstick: NEGATIVE
Ketones, ur: NEGATIVE mg/dL
Leukocytes,Ua: NEGATIVE
Nitrite: NEGATIVE
Protein, ur: NEGATIVE mg/dL
Specific Gravity, Urine: 1.006 (ref 1.005–1.030)
pH: 6 (ref 5.0–8.0)

## 2021-03-22 LAB — RESP PANEL BY RT-PCR (FLU A&B, COVID) ARPGX2
Influenza A by PCR: NEGATIVE
Influenza B by PCR: NEGATIVE
SARS Coronavirus 2 by RT PCR: NEGATIVE

## 2021-03-22 LAB — SODIUM, URINE, RANDOM: Sodium, Ur: 18 mmol/L

## 2021-03-22 LAB — OSMOLALITY, URINE: Osmolality, Ur: 199 mOsm/kg — ABNORMAL LOW (ref 300–900)

## 2021-03-22 MED ORDER — TAMSULOSIN HCL 0.4 MG PO CAPS
0.4000 mg | ORAL_CAPSULE | Freq: Every day | ORAL | Status: DC
  Administered 2021-03-23 – 2021-03-25 (×3): 0.4 mg via ORAL
  Filled 2021-03-22 (×3): qty 1

## 2021-03-22 MED ORDER — SODIUM CHLORIDE 0.9 % IV SOLN: INTRAVENOUS | Status: DC

## 2021-03-22 MED ORDER — CARVEDILOL 25 MG PO TABS
25.0000 mg | ORAL_TABLET | Freq: Two times a day (BID) | ORAL | Status: DC
  Administered 2021-03-23 – 2021-03-25 (×6): 25 mg via ORAL
  Filled 2021-03-22 (×6): qty 1

## 2021-03-22 MED ORDER — ACETAMINOPHEN 325 MG PO TABS
650.0000 mg | ORAL_TABLET | ORAL | Status: DC | PRN
  Administered 2021-03-22 – 2021-03-23 (×2): 650 mg via ORAL
  Filled 2021-03-22 (×2): qty 2

## 2021-03-22 MED ORDER — EZETIMIBE 10 MG PO TABS
10.0000 mg | ORAL_TABLET | Freq: Every day | ORAL | Status: DC
  Administered 2021-03-22 – 2021-03-24 (×3): 10 mg via ORAL
  Filled 2021-03-22 (×5): qty 1

## 2021-03-22 MED ORDER — IRBESARTAN 150 MG PO TABS
150.0000 mg | ORAL_TABLET | Freq: Every day | ORAL | Status: DC
  Administered 2021-03-23 – 2021-03-25 (×3): 150 mg via ORAL
  Filled 2021-03-22 (×3): qty 1

## 2021-03-22 MED ORDER — ISOSORBIDE MONONITRATE ER 30 MG PO TB24
30.0000 mg | ORAL_TABLET | Freq: Two times a day (BID) | ORAL | Status: DC
  Administered 2021-03-22 – 2021-03-25 (×6): 30 mg via ORAL
  Filled 2021-03-22 (×6): qty 1

## 2021-03-22 MED ORDER — BUPROPION HCL ER (SR) 100 MG PO TB12
100.0000 mg | ORAL_TABLET | Freq: Every day | ORAL | Status: DC
  Administered 2021-03-23 – 2021-03-25 (×3): 100 mg via ORAL
  Filled 2021-03-22 (×3): qty 1

## 2021-03-22 MED ORDER — HYDRALAZINE HCL 20 MG/ML IJ SOLN
5.0000 mg | Freq: Once | INTRAMUSCULAR | Status: DC
  Filled 2021-03-22 (×2): qty 1

## 2021-03-22 MED ORDER — SIMVASTATIN 20 MG PO TABS
40.0000 mg | ORAL_TABLET | Freq: Every day | ORAL | Status: DC
  Administered 2021-03-22 – 2021-03-23 (×2): 40 mg via ORAL
  Filled 2021-03-22 (×2): qty 2

## 2021-03-22 MED ORDER — PANTOPRAZOLE SODIUM 40 MG PO TBEC
40.0000 mg | DELAYED_RELEASE_TABLET | Freq: Two times a day (BID) | ORAL | Status: DC
  Administered 2021-03-22 – 2021-03-25 (×6): 40 mg via ORAL
  Filled 2021-03-22 (×7): qty 1

## 2021-03-22 MED ORDER — MOMETASONE FURO-FORMOTEROL FUM 100-5 MCG/ACT IN AERO
2.0000 | INHALATION_SPRAY | Freq: Two times a day (BID) | RESPIRATORY_TRACT | Status: DC
Start: 2021-03-22 — End: 2021-03-25
  Administered 2021-03-23 – 2021-03-25 (×5): 2 via RESPIRATORY_TRACT
  Filled 2021-03-22: qty 8.8

## 2021-03-22 MED ORDER — SENNA 8.6 MG PO TABS
1.0000 | ORAL_TABLET | Freq: Every day | ORAL | Status: DC | PRN
  Administered 2021-03-23 – 2021-03-25 (×3): 17.2 mg via ORAL
  Filled 2021-03-22 (×4): qty 2

## 2021-03-22 MED ORDER — HYDROXYZINE PAMOATE 25 MG PO CAPS
25.0000 mg | ORAL_CAPSULE | Freq: Two times a day (BID) | ORAL | Status: DC | PRN
  Filled 2021-03-22 (×2): qty 1

## 2021-03-22 MED ORDER — IPRATROPIUM-ALBUTEROL 0.5-2.5 (3) MG/3ML IN SOLN
3.0000 mL | Freq: Once | RESPIRATORY_TRACT | Status: AC
  Administered 2021-03-22: 3 mL via RESPIRATORY_TRACT
  Filled 2021-03-22: qty 3

## 2021-03-22 MED ORDER — SODIUM CHLORIDE 0.9 % IV BOLUS
1000.0000 mL | Freq: Once | INTRAVENOUS | Status: AC
  Administered 2021-03-22: 1000 mL via INTRAVENOUS

## 2021-03-22 MED ORDER — ENOXAPARIN SODIUM 40 MG/0.4ML IJ SOSY
40.0000 mg | PREFILLED_SYRINGE | INTRAMUSCULAR | Status: DC
  Administered 2021-03-22 – 2021-03-24 (×3): 40 mg via SUBCUTANEOUS
  Filled 2021-03-22 (×3): qty 0.4

## 2021-03-22 MED ORDER — CLOZAPINE 100 MG PO TABS
200.0000 mg | ORAL_TABLET | Freq: Every day | ORAL | Status: DC
  Administered 2021-03-23: 200 mg via ORAL
  Filled 2021-03-22 (×2): qty 2

## 2021-03-22 NOTE — ED Provider Notes (Signed)
[facility] Emergency Department Provider Note ____________________________________________   Event Date/Time   First MD Initiated Contact with Patient 03/22/21 1645     (approximate)  I have reviewed the triage vital signs and the nursing notes.  HISTORY  Chief Complaint Weakness and Fall   HPI Brandon [name] is a 65 y.o. malewho presents to the ED for evaluation of generalized weakness and recurrent falls.  Chart review indicates history schizophrenia, HTN, HLD and COPD.  CAD s/p stenting. Medical admission for similar presentation with hyponatremia 4 months ago.  Etiology thought to be due to polydipsia and poor osmolar intake.   Patient presents to the ED for evaluation of generalized weakness, lightheaded sensation, and recurrent falls.  He reports he has fallen 4-5 times in the past few days, including 2 today, due to leg weakness.  He reports "soft falls" where he has not hurt himself or cause any significant trauma.  He reports some mild pain/soreness to his left knee, but denies any significant injury and reports no pain with ambulation.   Denies fevers, syncope, chest pain, emesis or diarrhea.  Past Medical History:  Diagnosis Date   Anemia    IDA   Colon polyps    COPD (chronic obstructive pulmonary disease) (HCC)    Coronary artery disease    Heart attack (HCC) 2005   Hiatal hernia    History of ETOH abuse    Hyperlipidemia    Hypertension    Pleurisy    Schizophrenia (HCC)    Schizophrenia (HCC)    Seizures (HCC)    grand mal in 2000 or 2001 recovery from alcoholism   Suicide attempt [facility])     Patient Active Problem List   Diagnosis Date Noted   Weakness    Chronic schizophrenia (HCC)    Acute hyponatremia 11/27/2020   Appendicitis with perforation 07/17/2018   Acute cholecystitis 04/10/2018   Chronic cholecystitis 04/08/2018   Atherosclerosis of native arteries of extremity with intermittent claudication (HCC) 11/22/2017    Primary hypertension 11/22/2017   Hyperlipidemia 11/22/2017   COPD (chronic obstructive pulmonary disease) (HCC) 11/22/2017   CAD (coronary artery disease) 10/15/2017   Other toe(s) amputation status 11/17/2014   Schizoaffective disorder, bipolar type (HCC) 08/23/2008    Past Surgical History:  Procedure Laterality Date   ABDOMINAL SURGERY     internal bleeding   CHOLECYSTECTOMY N/A 05/04/2018   Procedure: LAPAROSCOPIC CHOLECYSTECTOMY, converted to open;  Surgeon: Sung Amabile, DO;  Location: [facility] ORS;  Service: General;  Laterality: N/A;   COLONOSCOPY     COLONOSCOPY WITH PROPOFOL N/A 05/09/2016   Procedure: COLONOSCOPY WITH PROPOFOL;  Surgeon: Scot Jun, MD;  Location: [facility] ENDOSCOPY;  Service: Endoscopy;  Laterality: N/A;   CORONARY ANGIOPLASTY WITH STENT PLACEMENT     1 vessel   ESOPHAGOGASTRODUODENOSCOPY (EGD) WITH PROPOFOL N/A 05/09/2016   Procedure: ESOPHAGOGASTRODUODENOSCOPY (EGD) WITH PROPOFOL;  Surgeon: Scot Jun, MD;  Location: [facility] ENDOSCOPY;  Service: Endoscopy;  Laterality: N/A;   EYE SURGERY Right    GLAUCOMA SURGERY     LAPAROSCOPIC APPENDECTOMY N/A 07/17/2018   Procedure: APPENDECTOMY LAPAROSCOPIC;  Surgeon: Sung Amabile, DO;  Location: [facility] ORS;  Service: General;  Laterality: N/A;   LEFT HEART CATH Right 10/19/2017   Procedure: Left Heart Cath and Coronary Angiography;  Surgeon: Laurier Nancy, MD;  Location: [facility] INVASIVE CV LAB;  Service: Cardiovascular;  Laterality: Right;   NOSE SURGERY     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  VASECTOMY      Prior to Admission medications   Medication Sig Start Date End Date Taking? Authorizing Provider  acetaminophen (TYLENOL) 325 MG tablet Take 650 mg by mouth every 4 (four) hours as needed for mild pain or moderate pain.    [provider]  buPROPion (WELLBUTRIN SR) 100 MG 12 hr tablet Take 100 mg by mouth daily.     [provider]  carvedilol (COREG) 25 MG tablet Take 25 mg by mouth 2 (two)  times daily with a meal.    [provider]  cloZAPine (CLOZARIL) 100 MG tablet Take 200 mg by mouth at bedtime.     [provider]  dorzolamide-timolol (COSOPT) 22.3-6.8 MG/ML ophthalmic solution Place 1 drop into both eyes 2 (two) times daily.    [provider]  ezetimibe (ZETIA) 10 MG tablet Take 10 mg by mouth at bedtime.     [provider]  hydrOXYzine (VISTARIL) 25 MG capsule Take 25 mg by mouth 2 (two) times daily as needed for anxiety.    [provider]  isosorbide mononitrate (IMDUR) 30 MG 24 hr tablet Take 30 mg by mouth 2 (two) times daily.     [provider]  latanoprost (XALATAN) 0.005 % ophthalmic solution Place 1 drop into both eyes at bedtime. 09/15/17   [provider]  nitroGLYCERIN (NITROSTAT) 0.4 MG SL tablet Place 0.4 mg under the tongue every 5 (five) minutes as needed for chest pain.     [provider]  olmesartan (BENICAR) 20 MG tablet Take 20 mg by mouth daily.    [provider]  pantoprazole (PROTONIX) 40 MG tablet Take 40 mg by mouth 2 (two) times daily.    [provider]  senna (SENOKOT) 8.6 MG tablet Take 1-2 tablets by mouth daily as needed for constipation.    [provider]  simvastatin (ZOCOR) 40 MG tablet Take 40 mg by mouth at bedtime.     [provider]  SYMBICORT 80-4.5 MCG/ACT inhaler Inhale 2 puffs into the lungs 2 (two) times daily. 09/15/17   [provider]  tamsulosin (FLOMAX) 0.4 MG CAPS capsule Take 0.4 mg by mouth daily.     [provider]    Allergies Compazine [prochlorperazine], Ivp dye [iodinated diagnostic agents], Alcohol-sulfur [elemental sulfur], Benadryl [diphenhydramine], Depakote [divalproex sodium], Dramamine [dimenhydrinate], Plavix [clopidogrel], and Rosuvastatin  Family History  Problem Relation Age of Onset   Lung cancer Mother    Hypertension Father    Heart attack Father    CAD Father    Prostate  cancer Neg Hx    Bladder Cancer Neg Hx    Kidney cancer Neg Hx     Social History Social History   Tobacco Use   Smoking status: Former    Packs/day: 1.00    Types: Cigarettes    Quit date: 2012    Years since quitting: 10.6   Smokeless tobacco: Former  Building services engineer Use: Never used  Substance Use Topics   Alcohol use: No    Comment: no alcohol since 2010   Drug use: No    Review of Systems  Constitutional: No fever/chills Eyes: No visual changes. ENT: No sore throat. Cardiovascular: Denies chest pain. Respiratory: Denies shortness of breath. Gastrointestinal: No abdominal pain.  No nausea, no vomiting.  No diarrhea.  No constipation. Genitourinary: Negative for dysuria. Musculoskeletal: Negative for back pain. Skin: Negative for rash. Neurological: Negative for headaches, focal weakness or numbness.  Positive  for generalized weakness ____________________________________________  PHYSICAL EXAM:  VITAL SIGNS: Vitals:   03/22/21 1652 03/22/21 1745  BP: 137/89   Pulse: 89 87  Resp: 18   Temp: (!) 97.5 F (36.4 C)   SpO2: 95% 96%    Constitutional: Alert and oriented. Well appearing and in no acute distress. Eyes: Conjunctivae are normal. PERRL. EOMI. Head: Atraumatic. Nose: No congestion/rhinnorhea. Mouth/Throat: Mucous membranes are moist.  Oropharynx non-erythematous. Neck: No stridor. No cervical spine tenderness to palpation. Cardiovascular: Normal rate, regular rhythm. Grossly normal heart sounds.  Good peripheral circulation. Respiratory: Normal respiratory effort.  No retractions. Lungs CTAB. Gastrointestinal: Soft , nondistended, nontender to palpation. No CVA tenderness. Musculoskeletal: No lower extremity tenderness nor edema.  No joint effusions. No signs of acute trauma. Neurologic:  Normal speech and language. No gross focal neurologic deficits are appreciated.  Cranial nerves II through XII intact 5/5 strength and sensation in all 4  extremities Skin:  Skin is warm, dry and intact. No rash noted. Psychiatric: Mood and affect are normal. Speech and behavior are normal. ____________________________________________   LABS (all labs ordered are listed, but only abnormal results are displayed)  Labs Reviewed  BASIC METABOLIC PANEL - Abnormal; Notable for the following components:      Result Value   Sodium 121 (*)    Chloride 87 (*)    Calcium 8.8 (*)    All other components within normal limits  CBC - Abnormal; Notable for the following components:   Hemoglobin 12.8 (*)    HCT 37.8 (*)    All other components within normal limits  RESP PANEL BY RT-PCR (FLU A&B, COVID) ARPGX2  URINALYSIS, COMPLETE (UACMP) WITH MICROSCOPIC   ____________________________________________  12 Lead EKG  Sinus rhythm, rate of 100 bpm.  Normal axis and intervals.  Stigmata of LVH.  Lateral and inferior submillimeter ST depressions, similar to EKG from April. ____________________________________________  RADIOLOGY  ED MD interpretation:    Official radiology report(s): No results found.  ____________________________________________   PROCEDURES and INTERVENTIONS  Procedure(s) performed (including Critical Care):  Procedures  Medications  sodium chloride 0.9 % bolus 1,000 mL (1,000 mLs Intravenous New Bag/Given 03/22/21 1709)    ____________________________________________   MDM / ED COURSE   Pleasant 65 year old schizophrenic gentleman presents to the ED with generalized weakness and recurrent falls, likely due to hyponatremia, and requiring medical admission.  Normal vitals.  Exam generally reassuring without evidence of significant trauma from any falls, any neurologic or vascular deficits focally.  He is reporting some mild soreness to his left knee, but has no overlying signs of trauma, tenderness, effusion, no pain with active or passive ranging and no indications for imaging at this time.  Blood work with  hyponatremia to 121.  EKG at baseline.  We will initiate repletion with normal saline bolus and discussed with hospitalist for admission considering the severity of his hyponatremia.     ____________________________________________   FINAL CLINICAL IMPRESSION(S) / ED DIAGNOSES  Final diagnoses:  Hyponatremia  Generalized weakness  Recurrent falls     ED Discharge Orders     None        Edwin Baines   Note:  This document was prepared using Dragon voice recognition software and may include unintentional dictation errors.    Delton Prairie, MD 03/22/21 [phone]

## 2021-03-22 NOTE — ED Triage Notes (Signed)
Patient reports chronic weakness x 1 year since having COVID last year. Patient reports increased falls, and difficulty with walking with a steady gait at home. Patient reports 2 falls today. Patient denies hitting his head when he fell today.

## 2021-03-22 NOTE — H&P (Signed)
History and Physical    Brandon [name] VZC:[id number] DOB: [birth year] DOA: 03/22/2021  PCP: Sherron Monday, MD  Patient coming from: Homes, lives alone  I have personally briefly reviewed patient's old medical records in [facility] Link  Chief Complaint: Weakness, dizziness  HPI: Brandon [name] is a 65 y.o. male with medical history significant for chronic hyponatremia with history of polydipsia, CAD s/p stent, hypertension, COPD not on oxygen, schizophrenia/bipolar disorder, and hyperlipidemia who presents with concerns of fall, weakness and dizziness.  For tat least past 2 weeks he noted increasing extremity weakness especially of his legs.  Has felt lightheaded and dizzy.  Also felt muscle cramp.  All symptoms progressively got worse and today had 2 falls landing on his knees just while he was walking due to weakness of his legs.  Has had some gagging but denies any vomiting.  No diarrhea. He has history of chronic hyponatremia and today is noted to have sodium of 121.  He was recently hospitalized for hyponatremia in July when he presented with a sodium of 123.  At that time he was treated for hypovolemia and also required administrative of tolvaptan and and sodium tablets by nephrology.  He was discharged with a sodium of 133.  He reports that he drinks about 2 bottles of Gatorade and about 1 gallon of water daily.  Denies alcohol use.  He later also endorse increasing shortness of breath after eating in the ED.  Reports that he had COVID about a year ago and since then has had on and off episodes of shortness of breath.  Denies any current tobacco use and states he quit about 7 years ago.  He has history of COPD.  ED Course: He was afebrile, normotensive on room air.  Sodium of 121, BG of 98.  Creatinine of 0.75. Patient was started on 1 L of normal saline and hospitalist was called for admission Chest x-ray pending at time of admission.  Review of Systems: Constitutional: No  Weight Change, No Fever ENT/Mouth: No sore throat, No Rhinorrhea Eyes: No Eye Pain, No Vision Changes Cardiovascular: No Chest Pain, + SOB, No PND, + Dyspnea on Exertion, No Orthopnea,  + Edema, No Palpitations Respiratory: No Cough, No Sputum, No Wheezing, no Dyspnea  Gastrointestinal: + Nausea, No Vomiting, No Diarrhea, No Constipation, No Pain Genitourinary: no Urinary Incontinence, No Urgency, No Flank Pain Musculoskeletal: No Arthralgias, No Myalgias Skin: No Skin Lesions, No Pruritus, Neuro: + Weakness, No Numbness,  No Loss of Consciousness, No Syncope Psych: No Anxiety/Panic, No Depression, no decrease appetite Heme/Lymph: No Bruising, No Bleeding  Past Medical History:  Diagnosis Date   Anemia    IDA   Colon polyps    COPD (chronic obstructive pulmonary disease) (HCC)    Coronary artery disease    Heart attack (HCC) 2005   Hiatal hernia    History of ETOH abuse    Hyperlipidemia    Hypertension    Pleurisy    Schizophrenia (HCC)    Schizophrenia (HCC)    Seizures (HCC)    grand mal in 2000 or 2001 recovery from alcoholism   Suicide attempt [facility])     Past Surgical History:  Procedure Laterality Date   ABDOMINAL SURGERY     internal bleeding   CHOLECYSTECTOMY N/A 05/04/2018   Procedure: LAPAROSCOPIC CHOLECYSTECTOMY, converted to open;  Surgeon: Sung Amabile, DO;  Location: [facility] ORS;  Service: General;  Laterality: N/A;   COLONOSCOPY     COLONOSCOPY WITH PROPOFOL  N/A 05/09/2016   Procedure: COLONOSCOPY WITH PROPOFOL;  Surgeon: Scot Jun, MD;  Location: [facility] ENDOSCOPY;  Service: Endoscopy;  Laterality: N/A;   CORONARY ANGIOPLASTY WITH STENT PLACEMENT     1 vessel   ESOPHAGOGASTRODUODENOSCOPY (EGD) WITH PROPOFOL N/A 05/09/2016   Procedure: ESOPHAGOGASTRODUODENOSCOPY (EGD) WITH PROPOFOL;  Surgeon: Scot Jun, MD;  Location: [facility] ENDOSCOPY;  Service: Endoscopy;  Laterality: N/A;   EYE SURGERY Right    GLAUCOMA SURGERY     LAPAROSCOPIC APPENDECTOMY N/A  07/17/2018   Procedure: APPENDECTOMY LAPAROSCOPIC;  Surgeon: Sung Amabile, DO;  Location: [facility] ORS;  Service: General;  Laterality: N/A;   LEFT HEART CATH Right 10/19/2017   Procedure: Left Heart Cath and Coronary Angiography;  Surgeon: Laurier Nancy, MD;  Location: [facility] INVASIVE CV LAB;  Service: Cardiovascular;  Laterality: Right;   NOSE SURGERY     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 His smoking use included cigarettes. He smoked an average of 1 pack per day. He has quit using smokeless tobacco. He reports that he does not drink alcohol and does not use drugs. Social History  Allergies  Allergen Reactions   Compazine [Prochlorperazine] Other (See Comments)    Dystonic rxn - convulsions. Near fatal reaction.    Ivp Dye [Iodinated Diagnostic Agents] Shortness Of Breath    Pt states SOB after last IV contrast injection   Alcohol-Sulfur [Elemental Sulfur] Other (See Comments)    History of alcoholism   Benadryl [Diphenhydramine] Other (See Comments)    "stuffy", nasal congestion   Depakote [Divalproex Sodium] Other (See Comments)    Cause elevated ammonia   Dramamine [Dimenhydrinate] Swelling   Plavix [Clopidogrel] Other (See Comments)    Rectal bleeding. "Perforated my intestines."   Rosuvastatin     Other reaction(s): Other (See Comments)    Family History  Problem Relation Age of Onset   Lung cancer Mother    Hypertension Father    Heart attack Father    CAD Father    Prostate cancer Neg Hx    Bladder Cancer Neg Hx    Kidney cancer Neg Hx      Prior to Admission medications   Medication Sig Start Date End Date Taking? Authorizing Provider  acetaminophen (TYLENOL) 325 MG tablet Take 650 mg by mouth every 4 (four) hours as needed for mild pain or moderate pain.   Yes [provider]  buPROPion (WELLBUTRIN SR) 100 MG 12 hr tablet Take 100 mg by mouth daily.    Yes [provider]   carvedilol (COREG) 25 MG tablet Take 25 mg by mouth 2 (two) times daily with a meal.   Yes [provider]  cloZAPine (CLOZARIL) 100 MG tablet Take 200 mg by mouth at bedtime.    Yes [provider]  dorzolamide-timolol (COSOPT) 22.3-6.8 MG/ML ophthalmic solution Place 1 drop into both eyes 2 (two) times daily.   Yes [provider]  ezetimibe (ZETIA) 10 MG tablet Take 10 mg by mouth at bedtime.    Yes [provider]  hydrOXYzine (VISTARIL) 25 MG capsule Take 25 mg by mouth 2 (two) times daily as needed for anxiety.   Yes [provider]  isosorbide mononitrate (IMDUR) 30 MG 24 hr tablet Take 30 mg by mouth 2 (two) times daily.    Yes [provider]  latanoprost (XALATAN) 0.005 % ophthalmic solution Place 1 drop into  both eyes at bedtime. 09/15/17  Yes [provider]  olmesartan (BENICAR) 20 MG tablet Take 20 mg by mouth daily.   Yes [provider]  pantoprazole (PROTONIX) 40 MG tablet Take 40 mg by mouth 2 (two) times daily.   Yes [provider]  senna (SENOKOT) 8.6 MG tablet Take 1-2 tablets by mouth daily as needed for constipation.   Yes [provider]  simvastatin (ZOCOR) 40 MG tablet Take 40 mg by mouth at bedtime.    Yes [provider]  SYMBICORT 80-4.5 MCG/ACT inhaler Inhale 2 puffs into the lungs 2 (two) times daily. 09/15/17  Yes [provider]  tamsulosin (FLOMAX) 0.4 MG CAPS capsule Take 0.4 mg by mouth daily.    Yes [provider]  nitroGLYCERIN (NITROSTAT) 0.4 MG SL tablet Place 0.4 mg under the tongue every 5 (five) minutes as needed for chest pain.     [provider]    Physical Exam: Vitals:   03/22/21 1557 03/22/21 1652 03/22/21 1745 03/22/21 1800  BP:  137/89  140/89  Pulse:  89 87 86  Resp:  18  (!) 28  Temp:  (!) 97.5 F (36.4 C)    TempSrc:  Oral    SpO2:  95% 96% 97%  Weight: 90 kg     Height: 6' (1.829 m)       Constitutional: NAD,  calm, comfortable, elderly gentleman appearing older than stated age lying flat in bed Vitals:   03/22/21 1557 03/22/21 1652 03/22/21 1745 03/22/21 1800  BP:  137/89  140/89  Pulse:  89 87 86  Resp:  18  (!) 28  Temp:  (!) 97.5 F (36.4 C)    TempSrc:  Oral    SpO2:  95% 96% 97%  Weight: 90 kg     Height: 6' (1.829 m)      Eyes: PERRL, lids and conjunctivae normal ENMT: Mucous membranes are moist.  Neck: normal, supple Respiratory: clear to auscultation bilaterally, no wheezing, no crackles. Normal respiratory effort. No accessory muscle use.  Cardiovascular: Regular rate and rhythm, no murmurs / rubs / gallops. No extremity edema. 2+ pedal pulses. No carotid bruits.  Abdomen: no tenderness, no masses palpated.  Bowel sounds positive.  Musculoskeletal: no clubbing / cyanosis.  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 Good ROM, no contractures. Normal muscle tone.  Skin: no rashes, lesions, ulcers. No induration Neurologic: CN 2-12 grossly intact. Sensation intact. Strength 5/5 in all 4.  Psychiatric: Normal judgment and insight. Alert and oriented x 3. Normal mood.     Labs on Admission: I have personally reviewed following labs and imaging studies  CBC: Recent Labs  Lab 03/22/21 1601  WBC 10.2  HGB 12.8*  HCT 37.8*  MCV 85.7  PLT 314   Basic Metabolic Panel: Recent Labs  Lab 03/22/21 1601  NA 121*  K 4.1  CL 87*  CO2 25  GLUCOSE 98  BUN 8  CREATININE 0.75  CALCIUM 8.8*   GFR: Estimated Creatinine Clearance: 102.4 mL/min (by C-G formula based on SCr of 0.75 mg/dL). Liver Function Tests: No results for input(s): AST, ALT, ALKPHOS, BILITOT, PROT, ALBUMIN in the last 168 hours. No results for input(s): LIPASE, AMYLASE in the last 168 hours. No results for input(s): AMMONIA in the last 168 hours. Coagulation Profile: No results for input(s): INR, PROTIME in the last 168 hours. Cardiac Enzymes: No results for input(s): CKTOTAL, CKMB, CKMBINDEX, TROPONINI in the  last 168 hours. BNP (last 3  results) No results for input(s): PROBNP in the last 8760 hours. HbA1C: No results for input(s): HGBA1C in the last 72 hours. CBG: No results for input(s): GLUCAP in the last 168 hours. Lipid Profile: No results for input(s): CHOL, HDL, LDLCALC, TRIG, CHOLHDL, LDLDIRECT in the last 72 hours. Thyroid Function Tests: No results for input(s): TSH, T4TOTAL, FREET4, T3FREE, THYROIDAB in the last 72 hours. Anemia Panel: No results for input(s): VITAMINB12, FOLATE, FERRITIN, TIBC, IRON, RETICCTPCT in the last 72 hours. Urine analysis:    Component Value Date/Time   COLORURINE YELLOW (A) 11/27/2020 2302   APPEARANCEUR CLEAR (A) 11/27/2020 2302   LABSPEC 1.010 11/27/2020 2302   PHURINE 5.0 11/27/2020 2302   GLUCOSEU NEGATIVE 11/27/2020 2302   HGBUR NEGATIVE 11/27/2020 2302   BILIRUBINUR NEGATIVE 11/27/2020 2302   KETONESUR NEGATIVE 11/27/2020 2302   PROTEINUR NEGATIVE 11/27/2020 2302   NITRITE NEGATIVE 11/27/2020 2302   LEUKOCYTESUR NEGATIVE 11/27/2020 2302    Radiological Exams on Admission: DG Chest 2 View  Result Date: 03/22/2021 CLINICAL DATA:  Shortness of breath. EXAM: CHEST - 2 VIEW COMPARISON:  One-view chest x-ray 12/02/2020 FINDINGS: Heart size upper limits of normal. Lungs are clear. No edema or effusion is present. No focal airspace disease is present. Axial skeleton is within normal limits. IMPRESSION: Borderline cardiomegaly without failure. Electronically Signed   By: Marin Roberts M.D.   On: 03/22/2021 19:00      Assessment/Plan  Acute on chronic hyponatremia -Likely secondary to polydipsia.  Endorse drinking 2 bottles of Gatorade and a gallon of water daily. -He was previously admitted in July for hypovolemic hyponatremia requiring nephrology consult and was given tolvaptan x1 and salt tablets -He was already given 1 L of normal saline fluid in the ED.   -Urine studies negative for SIADH -Continue gentle IV fluid hydration at 50  cc/h for 10 hours.  Free water restriction. -Avoid overcorrection >48meq daily -check BMP q4hr  COPD - Endorse some dyspnea in the ED but no wheezing heard on exam.  No hypoxia. -Does not appear to be in exacerbation but will give one-time DuoNeb  Hypertension -Continue Coreg, ACE  Schizophrenia - Continue antipsychotics  CAD - Asymptomatic, continue Imdur  BPH - Continue Flomax  DVT prophylaxis:.Lovenox Code Status: Full Family Communication: Plan discussed with patient at bedside  disposition Plan: Home with at least 2 midnight stays  Consults called:  Admission status: obs  Level of care: Progressive Cardiac  Status is: Inpatient  Remains inpatient appropriate because:Persistent severe electrolyte disturbances  Dispo: The patient is from: Home              Anticipated d/c is to: Home              Patient currently is not medically stable to d/c.   Difficult to place patient No         Anselm Jungling DO [facility]   If 7PM-7AM, please contact night-coverage [email or web address]   03/22/2021, 7:28 PM

## 2021-03-22 NOTE — ED Notes (Signed)
Request made for transport  

## 2021-03-22 NOTE — ED Notes (Signed)
Patient has not voided states he does not need to go at this time, urinal at bedside will call when ready.

## 2021-03-22 NOTE — ED Notes (Signed)
This RN at bedside after patient reports increased shortness of breath after eating. MD Katrinka Blazing made aware- see order for chest 2 view.  VSS. RR 22, spo2 96% on room air. Patient denies coughing or choking while eating and reports being able to finish his chicken. Respirations even and unlabored.

## 2021-03-22 NOTE — ED Notes (Signed)
Pt repositioned in bed.

## 2021-03-22 NOTE — ED Notes (Signed)
Pt unable to provide urine sample at this time 

## 2021-03-23 DIAGNOSIS — R296 Repeated falls: Secondary | ICD-10-CM

## 2021-03-23 LAB — CBC WITH DIFFERENTIAL/PLATELET
Abs Immature Granulocytes: 0.05 10*3/uL (ref 0.00–0.07)
Basophils Absolute: 0 10*3/uL (ref 0.0–0.1)
Basophils Relative: 0 %
Eosinophils Absolute: 0.1 10*3/uL (ref 0.0–0.5)
Eosinophils Relative: 1 %
HCT: 41.8 % (ref 39.0–52.0)
Hemoglobin: 13.4 g/dL (ref 13.0–17.0)
Immature Granulocytes: 1 %
Lymphocytes Relative: 21 %
Lymphs Abs: 1.7 10*3/uL (ref 0.7–4.0)
MCH: 27.7 pg (ref 26.0–34.0)
MCHC: 32.1 g/dL (ref 30.0–36.0)
MCV: 86.5 fL (ref 80.0–100.0)
Monocytes Absolute: 0.9 10*3/uL (ref 0.1–1.0)
Monocytes Relative: 12 %
Neutro Abs: 5.1 10*3/uL (ref 1.7–7.7)
Neutrophils Relative %: 65 %
Platelets: 302 10*3/uL (ref 150–400)
RBC: 4.83 MIL/uL (ref 4.22–5.81)
RDW: 13.4 % (ref 11.5–15.5)
WBC: 7.8 10*3/uL (ref 4.0–10.5)
nRBC: 0 % (ref 0.0–0.2)

## 2021-03-23 LAB — BASIC METABOLIC PANEL
Anion gap: 5 (ref 5–15)
Anion gap: 8 (ref 5–15)
BUN: 7 mg/dL — ABNORMAL LOW (ref 8–23)
BUN: 6 mg/dL — ABNORMAL LOW (ref 8–23)
CO2: 29 mmol/L (ref 22–32)
CO2: 26 mmol/L (ref 22–32)
Calcium: 8.5 mg/dL — ABNORMAL LOW (ref 8.9–10.3)
Calcium: 8.8 mg/dL — ABNORMAL LOW (ref 8.9–10.3)
Chloride: 93 mmol/L — ABNORMAL LOW (ref 98–111)
Chloride: 94 mmol/L — ABNORMAL LOW (ref 98–111)
Creatinine, Ser: 0.74 mg/dL (ref 0.61–1.24)
Creatinine, Ser: 0.64 mg/dL (ref 0.61–1.24)
GFR, Estimated: >60 mL/min (ref >60)
GFR, Estimated: >60 mL/min (ref >60)
Glucose, Bld: 109 mg/dL — ABNORMAL HIGH (ref 70–99)
Glucose, Bld: 93 mg/dL (ref 70–99)
Potassium: 4 mmol/L (ref 3.5–5.1)
Potassium: 4.3 mmol/L (ref 3.5–5.1)
Sodium: 127 mmol/L — ABNORMAL LOW (ref 135–145)
Sodium: 128 mmol/L — ABNORMAL LOW (ref 135–145)

## 2021-03-23 LAB — VITAMIN D 25 HYDROXY (VIT D DEFICIENCY, FRACTURES): Vit D, 25-Hydroxy: 26.15 ng/mL — ABNORMAL LOW (ref 30–100)

## 2021-03-23 MED ORDER — LATANOPROST 0.005 % OP SOLN
1.0000 [drp] | Freq: Every day | OPHTHALMIC | Status: DC
  Administered 2021-03-23 – 2021-03-24 (×2): 1 [drp] via OPHTHALMIC
  Filled 2021-03-23 (×2): qty 2.5

## 2021-03-23 MED ORDER — HYDROXYZINE HCL 25 MG PO TABS
25.0000 mg | ORAL_TABLET | Freq: Two times a day (BID) | ORAL | Status: DC | PRN
  Administered 2021-03-23: 25 mg via ORAL
  Filled 2021-03-23 (×2): qty 1

## 2021-03-23 MED ORDER — CLOZAPINE 25 MG PO TABS
150.0000 mg | ORAL_TABLET | Freq: Every day | ORAL | Status: DC
  Administered 2021-03-24: 150 mg via ORAL
  Filled 2021-03-23 (×3): qty 2

## 2021-03-23 MED ORDER — DORZOLAMIDE HCL-TIMOLOL MAL 2-0.5 % OP SOLN
1.0000 [drp] | Freq: Two times a day (BID) | OPHTHALMIC | Status: DC
  Administered 2021-03-23 – 2021-03-25 (×5): 1 [drp] via OPHTHALMIC
  Filled 2021-03-23 (×2): qty 10

## 2021-03-23 MED ORDER — SODIUM CHLORIDE 1 G PO TABS
1.0000 g | ORAL_TABLET | Freq: Two times a day (BID) | ORAL | Status: DC
  Administered 2021-03-23 (×2): 1 g via ORAL
  Filled 2021-03-23 (×3): qty 1

## 2021-03-23 NOTE — Evaluation (Signed)
Physical Therapy Evaluation Patient Details Name: Brandon [name] MRN: [id number] DOB: 13-Feb-1956 Today's Date: 03/23/2021   History of Present Illness  Pt is a 65 y/o M admitted on 03/22/21 with c/c of weakness, dizziness & fall. Pt is being treated for acute on chronic hyponatremia. PMH: chronic hyponatremia, hx of polydipsia, CAD s/p stent, HTN, COPD not on L2, schizophrenia/bipolar disodrer, HLD, heart attack, seizures  Clinical Impression  Pt seen for PT evaluation with pt just getting on [facility] with assistance from nurse. Encouraged pt to ambulate into bathroom & use toilet since he required extra time on commode. Pt is standing with supervision but PT moves bed to allow more room & pt with anterior LOB onto bed as pt was initially leaning on EOB for support. Pt lying on bed & assisted to sitting position. Pt then ambulates into bathroom with min assist with RW with hips/knees flexed throughout. Pt left on toilet with call bell in reach & nursing staff notified of pt needing extended time. Pt willing to go to STR & would benefit from additional therapy upon d/c to maximize independence with functional mobility & reduce fall risk prior to return home.     Follow Up Recommendations SNF;Supervision/Assistance - 24 hour    Equipment Recommendations  None recommended by PT    Recommendations for Other Services       Precautions / Restrictions Precautions Precautions: Fall Restrictions Weight Bearing Restrictions: No      Mobility  Bed Mobility               General bed mobility comments: not observed, pt received sitting on BSC    Transfers Overall transfer level: Needs assistance Equipment used: Rolling walker (2 wheeled) Transfers: Sit to/from Stand Sit to Stand: Supervision         General transfer comment: poor awareness of safe hand placement  Ambulation/Gait Ambulation/Gait assistance: Min assist Gait Distance (Feet): 12 Feet Assistive device: Rolling walker (2  wheeled) Gait Pattern/deviations: Decreased step length - right;Decreased dorsiflexion - right;Decreased stride length Gait velocity: decreased   General Gait Details: B knees flexed throughout  Stairs            Wheelchair Mobility    Modified [name] (Stroke Patients Only)       Balance Overall balance assessment: Needs assistance Sitting-balance support: Bilateral upper extremity supported;Feet supported Sitting balance-[name] Scale: Good     Standing balance support: Bilateral upper extremity supported;During functional activity Standing balance-[name] Scale: Fair                               Pertinent Vitals/Pain Pain Assessment: No/denies pain    Home Living Family/patient expects to be discharged to:: Private residence Living Arrangements: Alone Available Help at Discharge: Family;Friend(s);Available PRN/intermittently Type of Home:  (lives in an apartment within a house by himself) Home Access: Stairs to enter Entrance Stairs-Rails: Left Entrance Stairs-Number of Steps: 5-6 Home Layout: One level Home Equipment: Walker - 2 wheels;Cane - single point      Prior Function Level of Independence: Independent with assistive device(s)         Comments: Ambulatory with SPC in home, RW in community     Hand Dominance        Extremity/Trunk Assessment   Upper Extremity Assessment Upper Extremity Assessment: Generalized weakness    Lower Extremity Assessment Lower Extremity Assessment: Generalized weakness (unable to fully extend B knees in standing)  Communication   Communication: No difficulties  Cognition Arousal/Alertness: Awake/alert Behavior During Therapy: WFL for tasks assessed/performed Overall Cognitive Status: Within Functional Limits for tasks assessed                                 General Comments: Pleasant & agreeable to tx.      General Comments      Exercises     Assessment/Plan    PT  Assessment Patient needs continued PT services  PT Problem List Decreased strength;Decreased mobility;Decreased balance;Decreased activity tolerance       PT Treatment Interventions DME instruction;Therapeutic activities;Modalities;Gait training;Therapeutic exercise;Patient/family education;Stair training;Balance training;Functional mobility training;Manual techniques;Neuromuscular re-education    PT Goals (Current goals can be found in the Care Plan section)  Acute Rehab PT Goals Patient Stated Goal: get better PT Goal Formulation: With patient Time For Goal Achievement: 04/06/21 Potential to Achieve Goals: Good    Frequency Min 2X/week   Barriers to discharge Decreased caregiver support;Inaccessible home environment lives alone, stairs to enter home    Co-evaluation               AM-PAC PT "6 Clicks" Mobility  Outcome Measure Help needed turning from your back to your side while in a flat bed without using bedrails?: A Little Help needed moving from lying on your back to sitting on the side of a flat bed without using bedrails?: A Little Help needed moving to and from a bed to a chair (including a wheelchair)?: A Little Help needed standing up from a chair using your arms (e.g., wheelchair or bedside chair)?: A Little Help needed to walk in hospital room?: A Little Help needed climbing 3-5 steps with a railing? : A Lot 6 Click Score: 17    End of Session Equipment Utilized During Treatment: Gait belt Activity Tolerance: Patient tolerated treatment well Patient left:  (on toilet with call bell in reach - nurse aware of positioning) Nurse Communication: Mobility status PT Visit Diagnosis: Unsteadiness on feet (R26.81);Muscle weakness (generalized) (M62.81);Difficulty in walking, not elsewhere classified (R26.2)    Time: 9371-6967 PT Time Calculation (min) (ACUTE ONLY): 12 min   Charges:   PT Evaluation $PT Eval Moderate Complexity: 1 Mod          Aleda Grana, PT, DPT 03/23/21, 3:17 PM   Sandi Mariscal 03/23/2021, 3:15 PM

## 2021-03-23 NOTE — Progress Notes (Addendum)
Patient ID: Brandon [name], male   DOB: 1956-07-13, 65 y.o.   MRN: [id number] [facility] PROGRESS NOTE  Brandon [name] SKA:[id number] DOB: [birth year] DOA: 03/22/2021 PCP: Brandon Monday, MD  HPI/Subjective: The patient states that he drinks a lot of water and Gatorade at home.  Felt very weak and has been having some falls at home.  No nausea or vomiting.  Objective: Vitals:   03/23/21 0804 03/23/21 1148  BP: (!) 157/98 (!) 175/99  Pulse: 92 84  Resp: 16 18  Temp: 97.9 F (36.6 C) 98 F (36.7 C)  SpO2: 100% 92%    Intake/Output Summary (Last 24 hours) at 03/23/2021 1343 Last data filed at 03/23/2021 0647 Gross per 24 hour  Intake 49.24 ml  Output 700 ml  Net -650.76 ml   Filed Weights   03/22/21 1557 03/22/21 2118  Weight: 90 kg 82.7 kg    ROS: Review of Systems  Respiratory:  Negative for shortness of breath.   Cardiovascular:  Negative for chest pain.  Gastrointestinal:  Negative for abdominal pain, nausea and vomiting.  Exam: Physical Exam HENT:     Head: Normocephalic.     Mouth/Throat:     Pharynx: No oropharyngeal exudate.  Eyes:     General: Lids are normal.     Conjunctiva/sclera: Conjunctivae normal.  Cardiovascular:     Rate and Rhythm: Normal rate and regular rhythm.     Heart sounds: Normal heart sounds, S1 normal and S2 normal.  Pulmonary:     Breath sounds: Normal breath sounds. No decreased breath sounds, wheezing, rhonchi or rales.  Abdominal:     Palpations: Abdomen is soft.     Tenderness: no abdominal tenderness  Musculoskeletal:     Right lower leg: Swelling present.     Left lower leg: Swelling present.  Skin:    General: Skin is warm.     Findings: No rash.  Neurological:     Mental Status: He is alert and oriented to person, place, and time.      Scheduled Meds:  buPROPion ER  100 mg Oral Daily   carvedilol  25 mg Oral BID WC   cloZAPine  200 mg Oral QHS   dorzolamide-timolol  1 drop Both Eyes BID   enoxaparin  (LOVENOX) injection  40 mg Subcutaneous Q24H   ezetimibe  10 mg Oral QHS   hydrALAZINE  5 mg Intravenous Once   irbesartan  150 mg Oral Daily   isosorbide mononitrate  30 mg Oral BID   latanoprost  1 drop Both Eyes QHS   mometasone-formoterol  2 puff Inhalation BID   pantoprazole  40 mg Oral BID   simvastatin  40 mg Oral QHS   sodium chloride  1 g Oral BID WC   tamsulosin  0.4 mg Oral Daily    Assessment/Plan:  Acute on chronic hyponatremia.  The patient states he does drink a lot of Gatorade and water at home.  We will give fluid restriction.  Start salt tablets.  Discontinue IV fluids.  Continue to monitor sodium on a daily basis. Frequent falls.  Physical therapy evaluation and check a vitamin D Essential hypertension.  Continue Coreg, Imdur and irbesartan. Schizophrenia.  Continue clozapine COPD.  Respiratory status stable Hyperlipidemia unspecified on simvastatin BPH on Flomax History of CAD on Imdur and Coreg and simvastatin        Code Status:     Code Status Orders  (From admission, onward)  Start     Ordered   03/22/21 1850  Full code  Continuous        03/22/21 1851           Code Status History     Date Active Date Inactive Code Status Order ID Comments User Context   11/27/2020 2331 12/03/2020 2155 Full Code [id number]  Lilia Pro, MD ED   07/17/2018 1249 07/20/2018 1726 Full Code [id number]  Sung Amabile, DO Inpatient   05/04/2018 2029 05/07/2018 1634 Full Code [id number]  Sung Amabile, DO Inpatient   04/08/2018 2011 04/12/2018 1441 Full Code [id number]  Sung Amabile, DO Inpatient   10/19/2017 1252 10/19/2017 1820 Full Code [id number]  Laurier Nancy, MD Inpatient      Family Communication: Spoke with patient's brother on the phone Disposition Plan: Status is: Inpatient  Dispo: The patient is from: Home              Anticipated d/c is to: Need to see how he does with physical therapy.  Possible rehab              Patient currently with  acute on chronic hyponatremia.  Weakness and multiple falls and lives alone.   Difficult to place patient.  No.  Time spent: 27 minutes  Brandon [name]

## 2021-03-24 ENCOUNTER — Inpatient Hospital Stay: Payer: Medicare Other

## 2021-03-24 DIAGNOSIS — E559 Vitamin D deficiency, unspecified: Secondary | ICD-10-CM

## 2021-03-24 DIAGNOSIS — R1013 Epigastric pain: Secondary | ICD-10-CM

## 2021-03-24 LAB — BASIC METABOLIC PANEL
Anion gap: 7 (ref 5–15)
BUN: 9 mg/dL (ref 8–23)
CO2: 28 mmol/L (ref 22–32)
Calcium: 8.7 mg/dL — ABNORMAL LOW (ref 8.9–10.3)
Chloride: 93 mmol/L — ABNORMAL LOW (ref 98–111)
Creatinine, Ser: 0.54 mg/dL — ABNORMAL LOW (ref 0.61–1.24)
GFR, Estimated: >60 mL/min (ref >60)
Glucose, Bld: 110 mg/dL — ABNORMAL HIGH (ref 70–99)
Potassium: 3.9 mmol/L (ref 3.5–5.1)
Sodium: 128 mmol/L — ABNORMAL LOW (ref 135–145)

## 2021-03-24 MED ORDER — SODIUM CHLORIDE 1 G PO TABS
1.0000 g | ORAL_TABLET | Freq: Three times a day (TID) | ORAL | Status: DC
  Administered 2021-03-24 – 2021-03-25 (×6): 1 g via ORAL
  Filled 2021-03-24 (×7): qty 1

## 2021-03-24 MED ORDER — BARIUM SULFATE 2.1 % PO SUSP
450.0000 mL | ORAL | Status: AC
Start: 2021-03-24 — End: 2021-03-24

## 2021-03-24 MED ORDER — AMLODIPINE BESYLATE 5 MG PO TABS
5.0000 mg | ORAL_TABLET | Freq: Every day | ORAL | Status: DC
  Administered 2021-03-24 – 2021-03-25 (×2): 5 mg via ORAL
  Filled 2021-03-24 (×2): qty 1

## 2021-03-24 MED ORDER — VITAMIN D 25 MCG (1000 UNIT) PO TABS
1000.0000 [IU] | ORAL_TABLET | Freq: Every day | ORAL | Status: DC
  Administered 2021-03-24 – 2021-03-25 (×2): 1000 [IU] via ORAL
  Filled 2021-03-24 (×2): qty 1

## 2021-03-24 MED ORDER — ATORVASTATIN CALCIUM 20 MG PO TABS
20.0000 mg | ORAL_TABLET | Freq: Every day | ORAL | Status: DC
  Administered 2021-03-24: 20 mg via ORAL
  Filled 2021-03-24: qty 1

## 2021-03-24 NOTE — TOC Initial Note (Signed)
Transition of Care [facility]) - Initial/Assessment Note    Patient Details  Name: Brandon [name] MRN: [id number] Date of Birth: 12/07/1955  Transition of Care [facility]) CM/SW Contact:    Liliana Cline, LCSW Phone Number: 03/24/2021, 10:37 AM  Clinical Narrative:         CSW spoke to patient regarding discharge planning. Patient lives alone. Uses [facility] for appointments. PCP is Dr. Ellsworth Lennox. Pharmacy is [facility]. Patient has a RW, rollator, and cane. Patient had [facility] recently. Patient went to [facility] in April. Confirmed home address in chart. Patient has had 2 COVID vaccines plus 1 booster, said he would like the 2nd booster if needed. Patient is agreeable to SNF recommendation and prefers [facility]. CSW started SNF workup.          Expected Discharge Plan: Skilled Nursing Facility Barriers to Discharge: Continued Medical Work up   Patient Goals and CMS Choice Patient states their goals for this hospitalization and ongoing recovery are:: SNF CMS Medicare.gov Compare Post Acute Care list provided to:: Patient Choice offered to / list presented to : Patient  Expected Discharge Plan and Services Expected Discharge Plan: Skilled Nursing Facility       Living arrangements for the past 2 months: Single Family Home                                      Prior Living Arrangements/Services Living arrangements for the past 2 months: Single Family Home Lives with:: Self Patient language and need for interpreter reviewed:: Yes Do you feel safe going back to the place where you live?: Yes      Need for Family Participation in Patient Care: Yes (Comment) Care giver support system in place?: Yes (comment) Current home services: DME, Home PT Criminal Activity/Legal Involvement Pertinent to Current Situation/Hospitalization: No - Comment as needed  Activities of Daily Living Home Assistive Devices/Equipment: Cane (specify quad or straight) ADL Screening (condition at time of  admission) Patient's cognitive ability adequate to safely complete daily activities?: Yes Is the patient deaf or have difficulty hearing?: No Does the patient have difficulty seeing, even when wearing glasses/contacts?: No Does the patient have difficulty concentrating, remembering, or making decisions?: No Patient able to express need for assistance with ADLs?: Yes Does the patient have difficulty dressing or bathing?: Yes Independently performs ADLs?: Yes (appropriate for developmental age) Does the patient have difficulty walking or climbing stairs?: Yes Weakness of Legs: Both Weakness of Arms/Hands: None  Permission Sought/Granted Permission sought to share information with : Facility Industrial/product designer granted to share information with : Yes, Verbal Permission Granted     Permission granted to share info w AGENCY: SNFs        Emotional Assessment       Orientation: : Oriented to Self, Oriented to Place, Oriented to  Time, Oriented to Situation Alcohol / Substance Use: Not Applicable Psych Involvement: No (comment)  Admission diagnosis:  Hyponatremia [E87.1] Generalized weakness [R53.1] Recurrent falls [R29.6] Patient Active Problem List   Diagnosis Date Noted   Recurrent falls    Hyponatremia 03/22/2021   BPH (benign prostatic hyperplasia) 03/22/2021   Weakness    Chronic schizophrenia (HCC)    Acute hyponatremia 11/27/2020   Appendicitis with perforation 07/17/2018   Acute cholecystitis 04/10/2018   Chronic cholecystitis 04/08/2018   Atherosclerosis of native arteries of extremity with intermittent claudication (HCC) 11/22/2017   Essential hypertension  11/22/2017   Hyperlipidemia 11/22/2017   COPD (chronic obstructive pulmonary disease) (HCC) 11/22/2017   CAD (coronary artery disease) 10/15/2017   Other toe(s) amputation status 11/17/2014   Schizoaffective disorder, bipolar type (HCC) 08/23/2008   PCP:  Sherron Monday, MD Pharmacy:    [facility] [address] [place], [place] - [address] [place] [address] Phone: [phone] Fax: [phone]     Social Determinants of Health (SDOH) Interventions    Readmission Risk Interventions Readmission Risk Prevention Plan 03/24/2021  Transportation Screening Complete  PCP or Specialist Appt within 5-7 Days Complete  Home Care Screening Complete  Medication Review (RN CM) Complete  Some recent data might be hidden

## 2021-03-24 NOTE — Progress Notes (Signed)
PHARMACIST - PHYSICIAN ORDER COMMUNICATION  CONCERNING: Amlodipine and Simvastatin  and risk of rhabdomyolysis  DESCRIPTION:  Patients on Amlodipine and simvastatin >10 mg/day have reported cases of rhabdomyolysis. Pharmacy is to assess simvastatin dose. If >10 mg, substitute atorvastatin (Lipitor) 1mg  for each 2mg  simvastatin.  This patient is ordered simvastatin 40mg  and amlodipine  5mg .    ACTION TAKEN: Per protocol pharmacy has discontinued the patient's order for simvastatin and replaced it with Atorvastatin 20mg .    , PharmD, BCPS Clinical Pharmacist 03/24/2021 9:35 AM

## 2021-03-24 NOTE — Progress Notes (Addendum)
Patient ID: Brandon [name], male   DOB: 11-May-1956, 65 y.o.   MRN: [id number] [facility] PROGRESS NOTE  Brandon [name]:[id number] DOB: [birth year] DOA: 03/22/2021 PCP: Sherron Monday, MD  HPI/Subjective: Patient feeling okay.  Did have some abdominal discomfort when I was pressing in his epigastric area.  No nausea or vomiting.  Does feel little constipated.  Admitted with falls and weakness and found to have a low sodium.  Objective: Vitals:   03/24/21 0457 03/24/21 0747  BP: (!) 145/105 (!) 145/80  Pulse: 87 84  Resp: 20 15  Temp: 97.7 F (36.5 C) 97.9 F (36.6 C)  SpO2: 97% 95%     Filed Weights   03/22/21 1557 03/22/21 2118  Weight: 90 kg 82.7 kg    ROS: Review of Systems  Respiratory:  Negative for shortness of breath.   Cardiovascular:  Negative for chest pain.  Gastrointestinal:  Positive for abdominal pain and constipation. Negative for nausea and vomiting.  Exam: Physical Exam HENT:     Head: Normocephalic.     Mouth/Throat:     Pharynx: No oropharyngeal exudate.  Eyes:     General: Lids are normal.     Conjunctiva/sclera: Conjunctivae normal.     Pupils: Pupils are equal, round, and reactive to light.  Cardiovascular:     Rate and Rhythm: Normal rate and regular rhythm.     Heart sounds: Normal heart sounds, S1 normal and S2 normal.  Pulmonary:     Breath sounds: Normal breath sounds. No decreased breath sounds, wheezing, rhonchi or rales.  Abdominal:     Palpations: Abdomen is soft.     Tenderness: There is abdominal tenderness in the epigastric area.  Musculoskeletal:     Right lower leg: Swelling present.     Left lower leg: Swelling present.  Skin:    General: Skin is warm.     Findings: No rash.  Neurological:     Mental Status: He is alert.     Comments: Patient answers all questions appropriately for me today.  Able to follow commands and straight leg raise.      Scheduled Meds:  amLODipine  5 mg Oral Daily   atorvastatin   20 mg Oral QHS   buPROPion ER  100 mg Oral Daily   carvedilol  25 mg Oral BID WC   cholecalciferol  1,000 Units Oral Daily   cloZAPine  150 mg Oral QHS   dorzolamide-timolol  1 drop Both Eyes BID   enoxaparin (LOVENOX) injection  40 mg Subcutaneous Q24H   ezetimibe  10 mg Oral QHS   hydrALAZINE  5 mg Intravenous Once   irbesartan  150 mg Oral Daily   isosorbide mononitrate  30 mg Oral BID   latanoprost  1 drop Both Eyes QHS   mometasone-formoterol  2 puff Inhalation BID   pantoprazole  40 mg Oral BID   sodium chloride  1 g Oral TID WC   tamsulosin  0.4 mg Oral Daily   Brief history: Patient admitted on 03/22/2021 with weakness and dizziness and found to have a low sodium of 121.  Patient was started on IV fluids.  Sodium did come up to 128.  Patient was very weak and physical therapy recommended rehab.  Past medical history of acute on chronic hyponatremia, COPD, hypertension, schizophrenia, CAD and BPH  Assessment/Plan:  Acute on chronic hyponatremia.  The patient states he does drink a lot of Gatorade and water at home.  Continue fluid restriction.  Continue salt tablets.  I discontinued fluids yesterday.  Sodium 121 on presentation.  Sodium still 128 today. Epigastric abdominal pain and some constipation.  Patient already on Protonix.  We will get a CT scan of the abdomen pelvis with oral contrast only since has an allergy to IV contrast. Frequent falls and vitamin D deficiency, generalized weakness.  Supplement oral vitamin D.  Physical therapy recommending rehab.  Transitional care team to look into options. Essential hypertension.  Blood pressure up this morning.  Added low-dose Norvasc.  Continue Coreg Imdur and irbesartan Schizophrenia.  Continue clozapine COPD.  Respiratory status stable Hyperlipidemia unspecified.  Continue atorvastatin BPH on Flomax History of CAD.  Continue Imdur, Coreg and atorvastatin     Code Status:     Code Status Orders  (From admission, onward)            Start     Ordered   03/22/21 1850  Full code  Continuous        03/22/21 1851           Code Status History     Date Active Date Inactive Code Status Order ID Comments User Context   11/27/2020 2331 12/03/2020 2155 Full Code [id number]  Lilia Pro, MD ED   07/17/2018 1249 07/20/2018 1726 Full Code [id number]  Sung Amabile, DO Inpatient   05/04/2018 2029 05/07/2018 1634 Full Code [id number]  Sung Amabile, DO Inpatient   04/08/2018 2011 04/12/2018 1441 Full Code [id number]  Sung Amabile, DO Inpatient   10/19/2017 1252 10/19/2017 1820 Full Code [id number]  Laurier Nancy, MD Inpatient      Family Communication: Spoke with patient's brother on the phone Disposition Plan: Status is: Inpatient  Dispo: The patient is from: Home              Anticipated d/c is to: Rehab              Patient currently being treated for hyponatremia.  Since having epigastric pain we will get a CT scan of the abdomen today.   Difficult to place patient.  No.  Time spent: 27 minutes  [employer]

## 2021-03-24 NOTE — NC FL2 (Signed)
[place] MEDICAID FL2 LEVEL OF CARE SCREENING TOOL     IDENTIFICATION  Patient Name: Brandon [name] Birthdate: [birth year] Sex: male Admission Date (Current Location): 03/22/2021  [facility] and [place] Number:  Chiropodist and Address:  [facility], [address], [place], [place] [address]      Provider Number: [id number]  Attending Physician Name and Address:  Alford Highland, MD  Relative Name and Phone Number:  Starling, Christofferson (Brother)   [phone] [facility] Phone)    Current Level of Care: Hospital Recommended Level of Care: Skilled Nursing Facility Prior Approval Number:    Date Approved/Denied:   PASRR Number: [id number]  Discharge Plan:      Current Diagnoses: Patient Active Problem List   Diagnosis Date Noted   Recurrent falls    Hyponatremia 03/22/2021   BPH (benign prostatic hyperplasia) 03/22/2021   Weakness    Chronic schizophrenia (HCC)    Acute hyponatremia 11/27/2020   Appendicitis with perforation 07/17/2018   Acute cholecystitis 04/10/2018   Chronic cholecystitis 04/08/2018   Atherosclerosis of native arteries of extremity with intermittent claudication (HCC) 11/22/2017   Essential hypertension 11/22/2017   Hyperlipidemia 11/22/2017   COPD (chronic obstructive pulmonary disease) (HCC) 11/22/2017   CAD (coronary artery disease) 10/15/2017   Other toe(s) amputation status 11/17/2014   Schizoaffective disorder, bipolar type (HCC) 08/23/2008    Orientation RESPIRATION BLADDER Height & Weight     Self, Time, Situation, Place  Normal Continent Weight: 182 lb 6.4 oz (82.7 kg) Height:  6' (182.9 cm)  BEHAVIORAL SYMPTOMS/MOOD NEUROLOGICAL BOWEL NUTRITION STATUS        Diet (1500 ml fluid restriction)  AMBULATORY STATUS COMMUNICATION OF NEEDS Skin   Limited Assist Verbally Other (Comment) (dry skin)                       Personal Care Assistance Level of Assistance  Bathing, Feeding, Dressing Bathing  Assistance: Limited assistance Feeding assistance: Independent Dressing Assistance: Limited assistance     Functional Limitations Info             SPECIAL CARE FACTORS FREQUENCY  PT (By licensed PT), OT (By licensed OT)     PT Frequency: 5 x/week OT Frequency: 5 x/week            Contractures      Additional Factors Info  Code Status, Allergies Code Status Info: full Allergies Info: Compazine (Prochlorperazine), Ivp Dye (Iodinated Diagnostic Agents), Alcohol-sulfur (Elemental Sulfur), Benadryl (Diphenhydramine), Depakote (Divalproex Sodium), Dramamine (Dimenhydrinate), Plavix (Clopidogrel), Rosuvastatin           Current Medications (03/24/2021):  This is the current hospital active medication list Current Facility-Administered Medications  Medication Dose Route Frequency Provider Last Rate Last Admin   acetaminophen (TYLENOL) tablet 650 mg  650 mg Oral Q4H PRN Tu, Ching T, DO   650 mg at 03/23/21 1850   amLODipine (NORVASC) tablet 5 mg  5 mg Oral Daily Alford Highland, MD   5 mg at 03/24/21 0833   atorvastatin (LIPITOR) tablet 20 mg  20 mg Oral QHS Hallaji, Mardene Speak, RPH       buPROPion ER [facility] SR) 12 hr tablet 100 mg  100 mg Oral Daily Tu, Ching T, DO   100 mg at 03/24/21 0835   carvedilol (COREG) tablet 25 mg  25 mg Oral BID WC Tu, Ching T, DO   25 mg at 03/24/21 0932   cholecalciferol (VITAMIN D3) tablet 1,000 Units  1,000  Units Oral Daily Alford Highland, MD   1,000 Units at 03/24/21 2536   cloZAPine (CLOZARIL) tablet 150 mg  150 mg Oral QHS Mansy, Jan A, MD       dorzolamide-timolol (COSOPT) 22.3-6.8 MG/ML ophthalmic solution 1 drop  1 drop Both Eyes BID Alford Highland, MD   1 drop at 03/24/21 0834   enoxaparin (LOVENOX) injection 40 mg  40 mg Subcutaneous Q24H Tu, Ching T, DO   40 mg at 03/23/21 2150   ezetimibe (ZETIA) tablet 10 mg  10 mg Oral QHS Tu, Ching T, DO   10 mg at 03/23/21 2148   hydrALAZINE (APRESOLINE) injection 5 mg  5 mg Intravenous Once  Tu, Ching T, DO       hydrOXYzine (ATARAX/VISTARIL) tablet 25 mg  25 mg Oral BID PRN Otelia Sergeant, RPH   25 mg at 03/23/21 0158   irbesartan (AVAPRO) tablet 150 mg  150 mg Oral Daily Tu, Ching T, DO   150 mg at 03/24/21 6440   isosorbide mononitrate (IMDUR) 24 hr tablet 30 mg  30 mg Oral BID Tu, Ching T, DO   30 mg at 03/24/21 0833   latanoprost (XALATAN) 0.005 % ophthalmic solution 1 drop  1 drop Both Eyes QHS Alford Highland, MD   1 drop at 03/23/21 2145   mometasone-formoterol (DULERA) 100-5 MCG/ACT inhaler 2 puff  2 puff Inhalation BID Tu, Ching T, DO   2 puff at 03/24/21 0834   pantoprazole (PROTONIX) EC tablet 40 mg  40 mg Oral BID Tu, Ching T, DO   40 mg at 03/24/21 3474   senna (SENOKOT) tablet 8.6-17.2 mg  1-2 tablet Oral Daily PRN Tu, Ching T, DO   17.2 mg at 03/24/21 2595   sodium chloride tablet 1 g  1 g Oral TID WC Alford Highland, MD   1 g at 03/24/21 0836   tamsulosin (FLOMAX) capsule 0.4 mg  0.4 mg Oral Daily Tu, Ching T, DO   0.4 mg at 03/24/21 6387     Discharge Medications: Please see discharge summary for a list of discharge medications.  Relevant Imaging Results:  Relevant Lab Results:   Additional Information SS #: [id number]  Bernestine Holsapple E Awab Abebe, LCSW

## 2021-03-25 DIAGNOSIS — I10 Essential (primary) hypertension: Secondary | ICD-10-CM | POA: Diagnosis not present

## 2021-03-25 DIAGNOSIS — F250 Schizoaffective disorder, bipolar type: Secondary | ICD-10-CM | POA: Diagnosis present

## 2021-03-25 DIAGNOSIS — G40909 Epilepsy, unspecified, not intractable, without status epilepticus: Secondary | ICD-10-CM | POA: Diagnosis present

## 2021-03-25 DIAGNOSIS — G928 Other toxic encephalopathy: Secondary | ICD-10-CM | POA: Diagnosis not present

## 2021-03-25 DIAGNOSIS — M6281 Muscle weakness (generalized): Secondary | ICD-10-CM | POA: Diagnosis not present

## 2021-03-25 DIAGNOSIS — J449 Chronic obstructive pulmonary disease, unspecified: Secondary | ICD-10-CM | POA: Diagnosis not present

## 2021-03-25 DIAGNOSIS — R531 Weakness: Secondary | ICD-10-CM | POA: Diagnosis not present

## 2021-03-25 DIAGNOSIS — E876 Hypokalemia: Secondary | ICD-10-CM | POA: Diagnosis not present

## 2021-03-25 DIAGNOSIS — E785 Hyperlipidemia, unspecified: Secondary | ICD-10-CM | POA: Diagnosis not present

## 2021-03-25 DIAGNOSIS — H4010X0 Unspecified open-angle glaucoma, stage unspecified: Secondary | ICD-10-CM | POA: Diagnosis not present

## 2021-03-25 DIAGNOSIS — Z8249 Family history of ischemic heart disease and other diseases of the circulatory system: Secondary | ICD-10-CM | POA: Diagnosis not present

## 2021-03-25 DIAGNOSIS — D649 Anemia, unspecified: Secondary | ICD-10-CM | POA: Diagnosis not present

## 2021-03-25 DIAGNOSIS — J441 Chronic obstructive pulmonary disease with (acute) exacerbation: Secondary | ICD-10-CM | POA: Diagnosis not present

## 2021-03-25 DIAGNOSIS — J9601 Acute respiratory failure with hypoxia: Secondary | ICD-10-CM | POA: Diagnosis not present

## 2021-03-25 DIAGNOSIS — E1010 Type 1 diabetes mellitus with ketoacidosis without coma: Secondary | ICD-10-CM | POA: Diagnosis not present

## 2021-03-25 DIAGNOSIS — R5381 Other malaise: Secondary | ICD-10-CM | POA: Diagnosis not present

## 2021-03-25 DIAGNOSIS — B370 Candidal stomatitis: Secondary | ICD-10-CM | POA: Diagnosis not present

## 2021-03-25 DIAGNOSIS — J9602 Acute respiratory failure with hypercapnia: Secondary | ICD-10-CM | POA: Diagnosis not present

## 2021-03-25 DIAGNOSIS — H409 Unspecified glaucoma: Secondary | ICD-10-CM | POA: Diagnosis not present

## 2021-03-25 DIAGNOSIS — I209 Angina pectoris, unspecified: Secondary | ICD-10-CM | POA: Diagnosis not present

## 2021-03-25 DIAGNOSIS — Z91048 Other nonmedicinal substance allergy status: Secondary | ICD-10-CM | POA: Diagnosis not present

## 2021-03-25 DIAGNOSIS — D509 Iron deficiency anemia, unspecified: Secondary | ICD-10-CM | POA: Diagnosis not present

## 2021-03-25 DIAGNOSIS — E871 Hypo-osmolality and hyponatremia: Secondary | ICD-10-CM | POA: Diagnosis not present

## 2021-03-25 DIAGNOSIS — J690 Pneumonitis due to inhalation of food and vomit: Secondary | ICD-10-CM | POA: Diagnosis not present

## 2021-03-25 DIAGNOSIS — D508 Other iron deficiency anemias: Secondary | ICD-10-CM | POA: Diagnosis not present

## 2021-03-25 DIAGNOSIS — F32A Depression, unspecified: Secondary | ICD-10-CM | POA: Diagnosis not present

## 2021-03-25 DIAGNOSIS — Z789 Other specified health status: Secondary | ICD-10-CM | POA: Diagnosis not present

## 2021-03-25 DIAGNOSIS — Z888 Allergy status to other drugs, medicaments and biological substances status: Secondary | ICD-10-CM | POA: Diagnosis not present

## 2021-03-25 DIAGNOSIS — I4891 Unspecified atrial fibrillation: Secondary | ICD-10-CM | POA: Diagnosis not present

## 2021-03-25 DIAGNOSIS — R2681 Unsteadiness on feet: Secondary | ICD-10-CM | POA: Diagnosis not present

## 2021-03-25 DIAGNOSIS — Z20822 Contact with and (suspected) exposure to covid-19: Secondary | ICD-10-CM | POA: Diagnosis not present

## 2021-03-25 DIAGNOSIS — I252 Old myocardial infarction: Secondary | ICD-10-CM | POA: Diagnosis not present

## 2021-03-25 DIAGNOSIS — I2510 Atherosclerotic heart disease of native coronary artery without angina pectoris: Secondary | ICD-10-CM | POA: Diagnosis not present

## 2021-03-25 DIAGNOSIS — N179 Acute kidney failure, unspecified: Secondary | ICD-10-CM | POA: Diagnosis not present

## 2021-03-25 DIAGNOSIS — I110 Hypertensive heart disease with heart failure: Secondary | ICD-10-CM | POA: Diagnosis not present

## 2021-03-25 DIAGNOSIS — N400 Enlarged prostate without lower urinary tract symptoms: Secondary | ICD-10-CM | POA: Diagnosis present

## 2021-03-25 DIAGNOSIS — Z79899 Other long term (current) drug therapy: Secondary | ICD-10-CM | POA: Diagnosis not present

## 2021-03-25 DIAGNOSIS — Z955 Presence of coronary angioplasty implant and graft: Secondary | ICD-10-CM | POA: Diagnosis not present

## 2021-03-25 DIAGNOSIS — K2100 Gastro-esophageal reflux disease with esophagitis, without bleeding: Secondary | ICD-10-CM | POA: Diagnosis not present

## 2021-03-25 DIAGNOSIS — R748 Abnormal levels of other serum enzymes: Secondary | ICD-10-CM | POA: Diagnosis not present

## 2021-03-25 DIAGNOSIS — E119 Type 2 diabetes mellitus without complications: Secondary | ICD-10-CM | POA: Diagnosis not present

## 2021-03-25 DIAGNOSIS — Z743 Need for continuous supervision: Secondary | ICD-10-CM | POA: Diagnosis not present

## 2021-03-25 DIAGNOSIS — E222 Syndrome of inappropriate secretion of antidiuretic hormone: Secondary | ICD-10-CM | POA: Diagnosis not present

## 2021-03-25 DIAGNOSIS — Z91041 Radiographic dye allergy status: Secondary | ICD-10-CM | POA: Diagnosis not present

## 2021-03-25 LAB — BASIC METABOLIC PANEL
Anion gap: 7 (ref 5–15)
BUN: 9 mg/dL (ref 8–23)
CO2: 31 mmol/L (ref 22–32)
Calcium: 8.9 mg/dL (ref 8.9–10.3)
Chloride: 90 mmol/L — ABNORMAL LOW (ref 98–111)
Creatinine, Ser: 0.59 mg/dL — ABNORMAL LOW (ref 0.61–1.24)
GFR, Estimated: >60 mL/min (ref >60)
Glucose, Bld: 101 mg/dL — ABNORMAL HIGH (ref 70–99)
Potassium: 4 mmol/L (ref 3.5–5.1)
Sodium: 128 mmol/L — ABNORMAL LOW (ref 135–145)

## 2021-03-25 LAB — CBC
HCT: 37 % — ABNORMAL LOW (ref 39.0–52.0)
Hemoglobin: 12.3 g/dL — ABNORMAL LOW (ref 13.0–17.0)
MCH: 28.7 pg (ref 26.0–34.0)
MCHC: 33.2 g/dL (ref 30.0–36.0)
MCV: 86.2 fL (ref 80.0–100.0)
Platelets: 286 10*3/uL (ref 150–400)
RBC: 4.29 MIL/uL (ref 4.22–5.81)
RDW: 13.4 % (ref 11.5–15.5)
WBC: 7.4 10*3/uL (ref 4.0–10.5)
nRBC: 0 % (ref 0.0–0.2)

## 2021-03-25 LAB — RESP PANEL BY RT-PCR (FLU A&B, COVID) ARPGX2
Influenza A by PCR: NEGATIVE
Influenza B by PCR: NEGATIVE
SARS Coronavirus 2 by RT PCR: NEGATIVE

## 2021-03-25 MED ORDER — SODIUM CHLORIDE 1 G PO TABS: 1.0000 g | ORAL_TABLET | Freq: Three times a day (TID) | ORAL | 0 refills | Status: DC

## 2021-03-25 MED ORDER — FLEET ENEMA 7-19 GM/118ML RE ENEM: 1.0000 | ENEMA | Freq: Once | RECTAL | Status: DC

## 2021-03-25 MED ORDER — BISACODYL 10 MG RE SUPP
10.0000 mg | Freq: Once | RECTAL | Status: AC
  Administered 2021-03-25: 10 mg via RECTAL
  Filled 2021-03-25: qty 1

## 2021-03-25 MED ORDER — VITAMIN D3 25 MCG PO TABS: 1000.0000 [IU] | ORAL_TABLET | Freq: Every day | ORAL | 1 refills | Status: DC

## 2021-03-25 MED ORDER — AMLODIPINE BESYLATE 5 MG PO TABS: 5.0000 mg | ORAL_TABLET | Freq: Every day | ORAL | 0 refills | Status: DC

## 2021-03-25 NOTE — TOC Transition Note (Signed)
Transition of Care [facility]) - CM/SW Discharge Note   Patient Details  Name: Brandon [name] MRN: [id number] Date of Birth: [birth year]  [facility] CM/SW Contact:  Margarito Liner, LCSW Phone Number: 03/25/2021, 4:04 PM   Clinical Narrative:  Patient has orders to discharge to [facility] today. RN has already called report. EMS transport has been arranged. Patient aware that based on how far he walked today, insurance may not cover transport. No further concerns. CSW signing off.   Final next level of care: Skilled Nursing Facility Barriers to Discharge: Barriers Resolved   Patient Goals and CMS Choice Patient states their goals for this hospitalization and ongoing recovery are:: SNF CMS Medicare.gov Compare Post Acute Care list provided to:: Patient Choice offered to / list presented to : Patient, Sibling  Discharge Placement   Existing PASRR number confirmed : 03/24/21          Patient chooses bed at: [facility] [place] Patient to be transferred to facility by: EMS Name of family member notified: Jaydis Duchene Patient and family notified of of transfer: 03/25/21  Discharge Plan and Services                                     Social Determinants of Health (SDOH) Interventions     Readmission Risk Interventions Readmission Risk Prevention Plan 03/24/2021  Transportation Screening Complete  PCP or Specialist Appt within 5-7 Days Complete  Home Care Screening Complete  Medication Review (RN CM) Complete  Some recent data might be hidden

## 2021-03-25 NOTE — TOC Progression Note (Addendum)
Transition of Care [facility]) - Progression Note    Patient Details  Name: Brandon [name] MRN: [id number] Date of Birth: 05/06/1956  Transition [facility]) CM/SW Contact  Margarito Liner, LCSW Phone Number: 03/25/2021, 9:06 AM  Clinical Narrative:   No bed offers this morning. Left message for [facility] admissions coordinator.  11:57 am: [facility] has made a bed offer. Patient is aware.  1:26 pm: Per MD, patient is stable for discharge. [facility] does have a bed available. Uploaded clinicals into [facility] portal to start insurance authorization. Asked MD to order rapid COVID test.  2:55 pm: Insurance authorization approved. [facility] can accept him today but need discharge summary and COVID results within the next 45 mins-1 hour.  Expected Discharge Plan: Skilled Nursing Facility Barriers to Discharge: Continued Medical Work up  Expected Discharge Plan and Services Expected Discharge Plan: Skilled Nursing Facility       Living arrangements for the past 2 months: Single Family Home                                       Social Determinants of Health (SDOH) Interventions    Readmission Risk Interventions Readmission Risk Prevention Plan 03/24/2021  Transportation Screening Complete  PCP or Specialist Appt within 5-7 Days Complete  Home Care Screening Complete  Medication Review (RN CM) Complete  Some recent data might be hidden

## 2021-03-25 NOTE — Progress Notes (Signed)
Physical Therapy Treatment Patient Details Name: Brandon [name] MRN: [id number] DOB: Mar 04, 1956 Today's Date: 03/25/2021    History of Present Illness Pt is a 65 y/o M admitted on 03/22/21 with c/c of weakness, dizziness & fall. Pt is being treated for acute on chronic hyponatremia. PMH: chronic hyponatremia, hx of polydipsia, CAD s/p stent, HTN, COPD not on L2, schizophrenia/bipolar disodrer, HLD, heart attack, seizures    PT Comments    Pt seen for PT tx with pt c/o fatigue & constipation but agreeable to tx. Pt is able to complete bed mobility with use of hospital bed features & supervision. Pt requires max cuing for proper hand placement for safety with mobility with improvement towards end of session. Pt is able to complete sit>stand transfers with CGA progressing to supervision and ambulate increased distances into hallway with RW, although with impaired gait pattern as noted below. After resting in room, pt ambulates to recliner with supervision with pt with abrupt LOB onto EOB 2/2 weakness. Continue to recommend STR upon d/c to maximize independence with functional mobility & reduce fall risk prior to return home.      Follow Up Recommendations  SNF;Supervision/Assistance - 24 hour     Equipment Recommendations  None recommended by PT    Recommendations for Other Services       Precautions / Restrictions Precautions Precautions: Fall Restrictions Weight Bearing Restrictions: No    Mobility  Bed Mobility Overal bed mobility: Needs Assistance Bed Mobility: Supine to Sit     Supine to sit: Supervision;HOB elevated     General bed mobility comments: use of bed rails    Transfers Overall transfer level: Needs assistance Equipment used: Rolling walker (2 wheeled) Transfers: Sit to/from Stand Sit to Stand: Min guard;Supervision         General transfer comment: cuing to push to standing & reach back before sitting with improvement by end of  session  Ambulation/Gait Ambulation/Gait assistance: Min guard;Supervision Gait Distance (Feet): 68 Feet (+ 75 ft + 10 ft) Assistive device: Rolling walker (2 wheeled) Gait Pattern/deviations: Decreased step length - right;Decreased dorsiflexion - right;Decreased stride length;Trunk flexed Gait velocity: decreased   General Gait Details: BLE knees flexed throughout (L>R)   Stairs             Wheelchair Mobility    Modified [name] (Stroke Patients Only)       Balance Overall balance assessment: Needs assistance Sitting-balance support: Bilateral upper extremity supported;Feet supported Sitting balance-[name] Scale: Good     Standing balance support: Bilateral upper extremity supported;During functional activity Standing balance-[name] Scale: Fair                              Cognition Arousal/Alertness: Awake/alert Behavior During Therapy: WFL for tasks assessed/performed Overall Cognitive Status: Within Functional Limits for tasks assessed                                 General Comments: Pleasant & agreeable to tx.      Exercises      General Comments General comments (skin integrity, edema, etc.): Pt attempts to void sitting EOB but unsuccessful.      Pertinent Vitals/Pain Pain Assessment: No/denies pain    Home Living                      Prior Function  PT Goals (current goals can now be found in the care plan section) Acute Rehab PT Goals Patient Stated Goal: get better PT Goal Formulation: With patient Time For Goal Achievement: 04/06/21 Potential to Achieve Goals: Good Progress towards PT goals: Progressing toward goals    Frequency    Min 2X/week      PT Plan Current plan remains appropriate    Co-evaluation              AM-PAC PT "6 Clicks" Mobility   Outcome Measure  Help needed turning from your back to your side while in a flat bed without using bedrails?: A Little Help  needed moving from lying on your back to sitting on the side of a flat bed without using bedrails?: A Little Help needed moving to and from a bed to a chair (including a wheelchair)?: A Little Help needed standing up from a chair using your arms (e.g., wheelchair or bedside chair)?: A Little Help needed to walk in hospital room?: A Little Help needed climbing 3-5 steps with a railing? : A Lot 6 Click Score: 17    End of Session Equipment Utilized During Treatment: Gait belt Activity Tolerance: Patient tolerated treatment well Patient left: in chair;with call bell/phone within reach Nurse Communication: Mobility status PT Visit Diagnosis: Unsteadiness on feet (R26.81);Muscle weakness (generalized) (M62.81);Difficulty in walking, not elsewhere classified (R26.2)     Time: 8182-9937 PT Time Calculation (min) (ACUTE ONLY): 16 min  Charges:  $Therapeutic Activity: 8-22 mins                     Aleda Grana, PT, DPT 03/25/21, 12:45 PM    Sandi Mariscal 03/25/2021, 12:42 PM

## 2021-03-25 NOTE — Discharge Summary (Signed)
[facility] [place] [facility]   PATIENT NAME: Brandon [name]    MR#:  [id number]  DATE OF BIRTH:  1956/07/11  DATE OF ADMISSION:  03/22/2021 ADMITTING PHYSICIAN: Anselm Jungling, DO  DATE OF DISCHARGE: 03/25/2021  PRIMARY CARE PHYSICIAN: Sherron Monday, MD    ADMISSION DIAGNOSIS:  Hyponatremia [E87.1] Generalized weakness [R53.1] Recurrent falls [R29.6]  DISCHARGE DIAGNOSIS:  acute on chronic hyponatremia is due to excessive fluid intake  SECONDARY DIAGNOSIS:   Past Medical History:  Diagnosis Date  . Anemia    IDA  . Colon polyps   . COPD (chronic obstructive pulmonary disease) (HCC)   . Coronary artery disease   . Heart attack (HCC) 2005  . Hiatal hernia   . History of ETOH abuse   . Hyperlipidemia   . Hypertension   . Pleurisy   . Schizophrenia (HCC)   . Schizophrenia (HCC)   . Seizures (HCC)    grand mal in 2000 or 2001 recovery from alcoholism  . Suicide attempt [facility])     HOSPITAL COURSE:   Brandon [name] is a 65 y.o. male with medical history significant for chronic hyponatremia with history of polydipsia, CAD s/p stent, hypertension, COPD not on oxygen, schizophrenia/bipolar disorder, and hyperlipidemia who presents with concerns of fall, weakness and dizziness.   Acute on chronic hyponatremia.   --The patient states he does drink a lot of Gatorade and water at home.  -- Continue fluid restriction of 1200 cc/day --  Continue salt tablets.  --  Sodium 121 on presentation.  Sodium  128 today. -- Patient is alert and oriented times three. His sodium remains anywhere from 125 to 133   Epigastric abdominal pain and some constipation.   --Patient already on Protonix.  -- CT scan of the abdomen pelvis noted --pt had Large BM after suppository.   Frequent falls and vitamin D deficiency, generalized weakness.  --cont Supplement oral vitamin D.  Physical therapy recommending rehab.   --pt [facility] bed [facility]   Essential hypertension.   --cont low-dose Norvasc,Coreg Imdur and irbesartan   Chronic Schizophrenia.  Continue clozapine   COPD.  Respiratory status stable   Hyperlipidemia unspecified.  Continue atorvastatin   BPH on Flomax   History of CAD.  Continue Imdur, Coreg and atorvastatin     Family communication :none Consults : CODE STATUS: full DVT Prophylaxis :lovenox Level of care: Med-Surg Status is: Inpatient     Dispo: The patient is from: Home              Anticipated d/c is to: SNF              Patient currently is medically stable to d/c.              Difficult to place patient No   patient does have [facility] bed offer [facility]. CONSULTS OBTAINED:    DRUG ALLERGIES:   Allergies  Allergen Reactions  . Compazine [Prochlorperazine] Other (See Comments)    Dystonic rxn - convulsions. Near fatal reaction.   Ardine Bjork [Iodinated Diagnostic Agents] Shortness Of Breath    Pt states SOB after last IV contrast injection  . Alcohol-Sulfur [Elemental Sulfur] Other (See Comments)    History of alcoholism  . Benadryl [Diphenhydramine] Other (See Comments)    "stuffy", nasal congestion  . Depakote [Divalproex Sodium] Other (See Comments)    Cause elevated ammonia  . Dramamine [Dimenhydrinate] Swelling  . Plavix [Clopidogrel] Other (See Comments)  Rectal bleeding. "Perforated my intestines."  . Rosuvastatin     Other reaction(s): Other (See Comments)    DISCHARGE MEDICATIONS:   Allergies as of 03/25/2021       Reactions   Compazine [prochlorperazine] Other (See Comments)   Dystonic rxn - convulsions. Near fatal reaction.   Ivp Dye [iodinated Diagnostic Agents] Shortness Of Breath   Pt states SOB after last IV contrast injection   Alcohol-sulfur [elemental Sulfur] Other (See Comments)   History of alcoholism   Benadryl [diphenhydramine] Other (See Comments)   "stuffy", nasal congestion   Depakote [divalproex Sodium] Other (See Comments)   Cause elevated ammonia   Dramamine  [dimenhydrinate] Swelling   Plavix [clopidogrel] Other (See Comments)   Rectal bleeding. "Perforated my intestines."   Rosuvastatin    Other reaction(s): Other (See Comments)        Medication List     TAKE these medications    acetaminophen 325 MG tablet Commonly known as: TYLENOL Take 650 mg by mouth every 4 (four) hours as needed for mild pain or moderate pain.   amLODipine 5 MG tablet Commonly known as: NORVASC Take 1 tablet (5 mg total) by mouth daily. Start taking on: March 26, 2021   buPROPion ER 100 MG 12 hr tablet Commonly known as: WELLBUTRIN SR Take 100 mg by mouth daily.   carvedilol 25 MG tablet Commonly known as: COREG Take 25 mg by mouth 2 (two) times daily with a meal.   cloZAPine 100 MG tablet Commonly known as: CLOZARIL Take 200 mg by mouth at bedtime.   dorzolamide-timolol 22.3-6.8 MG/ML ophthalmic solution Commonly known as: COSOPT Place 1 drop into both eyes 2 (two) times daily.   ezetimibe 10 MG tablet Commonly known as: ZETIA Take 10 mg by mouth at bedtime.   hydrOXYzine 25 MG capsule Commonly known as: VISTARIL Take 25 mg by mouth 2 (two) times daily as needed for anxiety.   isosorbide mononitrate 30 MG 24 hr tablet Commonly known as: IMDUR Take 30 mg by mouth 2 (two) times daily.   latanoprost 0.005 % ophthalmic solution Commonly known as: XALATAN Place 1 drop into both eyes at bedtime.   nitroGLYCERIN 0.4 MG SL tablet Commonly known as: NITROSTAT Place 0.4 mg under the tongue every 5 (five) minutes as needed for chest pain.   olmesartan 20 MG tablet Commonly known as: BENICAR Take 20 mg by mouth daily.   pantoprazole 40 MG tablet Commonly known as: PROTONIX Take 40 mg by mouth 2 (two) times daily.   senna 8.6 MG tablet Commonly known as: SENOKOT Take 1-2 tablets by mouth daily as needed for constipation.   simvastatin 40 MG tablet Commonly known as: ZOCOR Take 40 mg by mouth at bedtime.   sodium chloride 1 g  tablet Take 1 tablet (1 g total) by mouth 3 (three) times daily with meals.   Symbicort 80-4.5 MCG/ACT inhaler Generic drug: budesonide-formoterol Inhale 2 puffs into the lungs 2 (two) times daily.   tamsulosin 0.4 MG Caps capsule Commonly known as: FLOMAX Take 0.4 mg by mouth daily.   Vitamin D3 25 MCG tablet Commonly known as: Vitamin D Take 1 tablet (1,000 Units total) by mouth daily. Start taking on: March 26, 2021        If you experience worsening of your admission symptoms, develop shortness of breath, life threatening emergency, suicidal or homicidal thoughts you must seek medical attention immediately by calling 911 or calling your MD immediately  if symptoms less severe.  You  Must read complete instructions/literature along with all the possible adverse reactions/side effects for all the Medicines you take and that have been prescribed to you. Take any new Medicines after you have completely understood and accept all the possible adverse reactions/side effects.   Please note  You were cared for by a hospitalist during your hospital stay. If you have any questions about your discharge medications or the care you received while you were in the hospital after you are discharged, you can call the unit and asked to speak with the hospitalist on call if the hospitalist that took care of you is not available. Once you are discharged, your primary care physician will handle any further medical issues. Please note that NO REFILLS for any discharge medications will be authorized once you are discharged, as it is imperative that you return to your primary care physician (or establish a relationship with a primary care physician if you do not have one) for your aftercare needs so that they can reassess your need for medications and monitor your lab values.  DATA REVIEW:   CBC  Recent Labs  Lab 03/25/21 0608  WBC 7.4  HGB 12.3*  HCT 37.0*  PLT 286    Chemistries  Recent Labs   Lab 03/25/21 0608  NA 128*  K 4.0  CL 90*  CO2 31  GLUCOSE 101*  BUN 9  CREATININE 0.59*  CALCIUM 8.9    Microbiology Results   Recent Results (from the past 240 hour(s))  Resp Panel by RT-PCR (Flu A&B, Covid) Nasopharyngeal Swab     Status: None   Collection Time: 03/22/21  4:51 PM   Specimen: Nasopharyngeal Swab; Nasopharyngeal(NP) swabs in vial transport medium  Result Value Ref Range Status   SARS Coronavirus 2 by RT PCR NEGATIVE NEGATIVE Final    Comment: (NOTE) SARS-CoV-2 target nucleic acids are NOT DETECTED.  The SARS-CoV-2 RNA is generally detectable in upper respiratory specimens during the acute phase of infection. The lowest concentration of SARS-CoV-2 viral copies this assay can detect is 138 copies/mL. A negative result does not preclude SARS-Cov-2 infection and should not be used as the sole basis for treatment or other patient management decisions. A negative result may occur with  improper specimen collection/handling, submission of specimen other than nasopharyngeal swab, presence of viral mutation(s) within the areas targeted by this assay, and inadequate number of viral copies(<138 copies/mL). A negative result must be combined with clinical observations, patient history, and epidemiological information. The expected result is Negative.  Fact Sheet for Patients:  BloggerCourse.com  Fact Sheet for Healthcare Providers:  SeriousBroker.it  This test is no t yet approved or cleared by the [place] FDA and  has been authorized for detection and/or diagnosis of SARS-CoV-2 by FDA under an Emergency Use Authorization (EUA). This EUA will remain  in effect (meaning this test can be used) for the duration of the COVID-19 declaration under Section 564(b)(1) of the Act, 21 U.S.C.section 360bbb-3(b)(1), unless the authorization is terminated  or revoked sooner.       Influenza A by PCR NEGATIVE NEGATIVE  Final   Influenza B by PCR NEGATIVE NEGATIVE Final    Comment: (NOTE) The Xpert Xpress SARS-CoV-2/FLU/RSV plus assay is intended as an aid in the diagnosis of influenza from Nasopharyngeal swab specimens and should not be used as a sole basis for treatment. Nasal washings and aspirates are unacceptable for Xpert Xpress SARS-CoV-2/FLU/RSV testing.  Fact Sheet for Patients: BloggerCourse.com  Fact Sheet for Healthcare Providers: SeriousBroker.it  This test is not yet approved or cleared by the [place] and has been authorized for detection and/or diagnosis of SARS-CoV-2 by FDA under an Emergency Use Authorization (EUA). This EUA will remain in effect (meaning this test can be used) for the duration of the COVID-19 declaration under Section 564(b)(1) of the Act, 21 U.S.C. section 360bbb-3(b)(1), unless the authorization is terminated or revoked.  Performed at [facility], [address] [place], [place] [address]   Resp Panel by RT-PCR (Flu A&B, Covid) Nasopharyngeal Swab     Status: None   Collection Time: 03/25/21  2:08 PM   Specimen: Nasopharyngeal Swab; Nasopharyngeal(NP) swabs in vial transport medium  Result Value Ref Range Status   SARS Coronavirus 2 by RT PCR NEGATIVE NEGATIVE Final    Comment: (NOTE) SARS-CoV-2 target nucleic acids are NOT DETECTED.  The SARS-CoV-2 RNA is generally detectable in upper respiratory specimens during the acute phase of infection. The lowest concentration of SARS-CoV-2 viral copies this assay can detect is 138 copies/mL. A negative result does not preclude SARS-Cov-2 infection and should not be used as the sole basis for treatment or other patient management decisions. A negative result may occur with  improper specimen collection/handling, submission of specimen other than nasopharyngeal swab, presence of viral mutation(s) within the areas targeted by this assay, and  inadequate number of viral copies(<138 copies/mL). A negative result must be combined with clinical observations, patient history, and epidemiological information. The expected result is Negative.  Fact Sheet for Patients:  BloggerCourse.com  Fact Sheet for Healthcare Providers:  SeriousBroker.it  This test is no t yet approved or cleared by the [place] FDA and  has been authorized for detection and/or diagnosis of SARS-CoV-2 by FDA under an Emergency Use Authorization (EUA). This EUA will remain  in effect (meaning this test can be used) for the duration of the COVID-19 declaration under Section 564(b)(1) of the Act, 21 U.S.C.section 360bbb-3(b)(1), unless the authorization is terminated  or revoked sooner.       Influenza A by PCR NEGATIVE NEGATIVE Final   Influenza B by PCR NEGATIVE NEGATIVE Final    Comment: (NOTE) The Xpert Xpress SARS-CoV-2/FLU/RSV plus assay is intended as an aid in the diagnosis of influenza from Nasopharyngeal swab specimens and should not be used as a sole basis for treatment. Nasal washings and aspirates are unacceptable for Xpert Xpress SARS-CoV-2/FLU/RSV testing.  Fact Sheet for Patients: BloggerCourse.com  Fact Sheet for Healthcare Providers: SeriousBroker.it  This test is not yet approved or cleared by the [place] FDA and has been authorized for detection and/or diagnosis of SARS-CoV-2 by FDA under an Emergency Use Authorization (EUA). This EUA will remain in effect (meaning this test can be used) for the duration of the COVID-19 declaration under Section 564(b)(1) of the Act, 21 U.S.C. section 360bbb-3(b)(1), unless the authorization is terminated or revoked.  Performed at [facility], [address] [place], [place] [address]     RADIOLOGY:  CT ABDOMEN PELVIS WO CONTRAST  Result Date: 03/24/2021 CLINICAL DATA:   Acute nonlocalized abdominal pain. EXAM: CT ABDOMEN AND PELVIS WITHOUT CONTRAST TECHNIQUE: Multidetector CT imaging of the abdomen and pelvis was performed following the standard protocol without IV contrast. COMPARISON:  Noncontrast CT 09/02/2018 FINDINGS: Lower chest: Linear atelectasis in the left greater than right lower lobe. Normal heart size with coronary artery calcifications. Trace pericardial fluid anteriorly. Hepatobiliary: Slight capsular nodularity of lower contours again seen. No discrete focal hepatic lesion on this unenhanced exam.  Patient is post cholecystectomy. Fluid density structure in the gallbladder fossa similar to prior and may represent a chronic seroma or gallbladder remnant. No internal gallstones or inflammation. There is no biliary dilatation. Pancreas: No ductal dilatation or inflammation. Spleen: Normal in size without focal abnormality. Splenule anteriorly. Adrenals/Urinary Tract: No adrenal nodule. There is symmetric bilateral perinephric edema, chronic but slightly progressed from prior exam. No hydronephrosis. No renal calculi. Multiple bilateral renal cysts. 7 mm hyperdensity arising from the lower left kidney is at site of prior simple cyst and most consistent with intervening hemorrhage or complexity. Ureters are decompressed. No evidence of solid lesion.urinary bladder is minimally distended but thick walled. Wall thickening is most prominent about the dome. Similar findings were seen on prior exam. Stomach/Bowel: Stomach physiologically distended by enteric contrast. Normal small bowel without obstruction or inflammation. High-riding cecum in the right upper quadrant. The appendix is surgically absent with suture line at the base of the cecum. Colonic interposition under the right hemidiaphragm in coursing lateral to the liver, stable from prior exam. Moderate volume of colonic stool. Diverticulosis from the distal transverse through the sigmoid. No diverticulitis. No colonic  inflammation. Vascular/Lymphatic: Advanced aortic and branch atherosclerosis. No aortic aneurysm. No portal venous or mesenteric gas. No abdominopelvic adenopathy. Reproductive: Mildly prominent prostate gland. Other: No ascites or free air. Musculoskeletal: Bones are under mineralized. Degenerative change in the spine. There are no acute or suspicious osseous abnormalities. IMPRESSION: 1. No acute findings in the abdomen pelvis. 2. Similar anterior bladder wall thickening to prior exam from 2020, presumably chronic. 3. Colonic diverticulosis without diverticulitis. 4. Slight capsular nodularity of lower liver contours, can be seen with cirrhosis. 5. Bilateral renal cysts, including a small hyperdense cyst in the lower left kidney. Aortic Atherosclerosis (ICD10-I70.0). Electronically Signed   By: Narda RutherfordMelanie  Sanford M.D.   On: 03/24/2021 17:39     CODE STATUS:     Code Status Orders  (From admission, onward)           Start     Ordered   03/22/21 1850  Full code  Continuous        03/22/21 1851           Code Status History     Date Active Date Inactive Code Status Order ID Comments User Context   11/27/2020 2331 12/03/2020 2155 Full Code [id number]  Lilia ProAcheampong, Peter K, MD ED   07/17/2018 1249 07/20/2018 1726 Full Code [id number]  Sung AmabileSakai, Isami, DO Inpatient   05/04/2018 2029 05/07/2018 1634 Full Code [id number]  Sung AmabileSakai, Isami, DO Inpatient   04/08/2018 2011 04/12/2018 1441 Full Code [id number]  Sung AmabileSakai, Isami, DO Inpatient   10/19/2017 1252 10/19/2017 1820 Full Code [id number]  Laurier NancyKhan, Shaukat A, MD Inpatient        TOTAL TIME TAKING CARE OF THIS PATIENT: 40 minutes.    Enedina FinnerSona Labib Cwynar M.D  [facility]    CC: Primary care physician; Sherron Mondayejan-Sie, S Ahmed, MD

## 2021-03-25 NOTE — Discharge Instructions (Signed)
PO fluid restriction for 1200 cc/day

## 2021-03-25 NOTE — Progress Notes (Signed)
[facility]  - [place] [facility]   PATIENT NAME: Brandon [name]    MR#:  [id number]  DATE OF BIRTH:  Jul 22, 1956  SUBJECTIVE:   patient sitting up in the recliner chair. Eating lunch earlier. Denies any complaints. Per RN had good BM patient is alert and oriented.  REVIEW OF SYSTEMS:   Review of Systems  Constitutional:  Negative for chills, fever and weight loss.  HENT:  Negative for ear discharge, ear pain and nosebleeds.   Eyes:  Negative for blurred vision, pain and discharge.  Respiratory:  Negative for sputum production, shortness of breath, wheezing and stridor.   Cardiovascular:  Negative for chest pain, palpitations, orthopnea and PND.  Gastrointestinal:  Negative for abdominal pain, diarrhea, nausea and vomiting.  Genitourinary:  Negative for frequency and urgency.  Musculoskeletal:  Negative for back pain and joint pain.  Neurological:  Positive for weakness. Negative for sensory change, speech change and focal weakness.  Psychiatric/Behavioral:  Negative for depression and hallucinations. The patient is not nervous/anxious.   Tolerating Diet:yes Tolerating PT: SNF  DRUG ALLERGIES:   Allergies  Allergen Reactions  . Compazine [Prochlorperazine] Other (See Comments)    Dystonic rxn - convulsions. Near fatal reaction.   Ardine Bjork [Iodinated Diagnostic Agents] Shortness Of Breath    Pt states SOB after last IV contrast injection  . Alcohol-Sulfur [Elemental Sulfur] Other (See Comments)    History of alcoholism  . Benadryl [Diphenhydramine] Other (See Comments)    "stuffy", nasal congestion  . Depakote [Divalproex Sodium] Other (See Comments)    Cause elevated ammonia  . Dramamine [Dimenhydrinate] Swelling  . Plavix [Clopidogrel] Other (See Comments)    Rectal bleeding. "Perforated my intestines."  . Rosuvastatin     Other reaction(s): Other (See Comments)    VITALS:  Blood pressure (!) 144/89, pulse 76, temperature (!) 97.5 F (36.4 C),  temperature source Oral, resp. rate 16, height 6' (1.829 m), weight 82.7 kg, SpO2 96 %.  PHYSICAL EXAMINATION:   Physical Exam  GENERAL:  65 y.o.-year-old patient lying in the bed with no acute distress. LUNGS: Normal breath sounds bilaterally, no wheezing, rales, rhonchi. No use of accessory muscles of respiration.  CARDIOVASCULAR: S1, S2 normal. No murmurs, rubs, or gallops.  ABDOMEN: Soft, nontender, nondistended. Bowel sounds present. No organomegaly or mass.  EXTREMITIES: No cyanosis, clubbing or edema b/l.    NEUROLOGIC: Cranial nerves II through XII are intact. No focal Motor or sensory deficits b/l.   PSYCHIATRIC:  patient is alert and oriented x 3.  SKIN: No obvious rash, lesion, or ulcer.   LABORATORY PANEL:  CBC Recent Labs  Lab 03/25/21 0608  WBC 7.4  HGB 12.3*  HCT 37.0*  PLT 286    Chemistries  Recent Labs  Lab 03/25/21 0608  NA 128*  K 4.0  CL 90*  CO2 31  GLUCOSE 101*  BUN 9  CREATININE 0.59*  CALCIUM 8.9   Cardiac Enzymes No results for input(s): TROPONINI in the last 168 hours. RADIOLOGY:  CT ABDOMEN PELVIS WO CONTRAST  Result Date: 03/24/2021 CLINICAL DATA:  Acute nonlocalized abdominal pain. EXAM: CT ABDOMEN AND PELVIS WITHOUT CONTRAST TECHNIQUE: Multidetector CT imaging of the abdomen and pelvis was performed following the standard protocol without IV contrast. COMPARISON:  Noncontrast CT 09/02/2018 FINDINGS: Lower chest: Linear atelectasis in the left greater than right lower lobe. Normal heart size with coronary artery calcifications. Trace pericardial fluid anteriorly. Hepatobiliary: Slight capsular nodularity of lower contours again seen. No discrete focal  hepatic lesion on this unenhanced exam. Patient is post cholecystectomy. Fluid density structure in the gallbladder fossa similar to prior and may represent a chronic seroma or gallbladder remnant. No internal gallstones or inflammation. There is no biliary dilatation. Pancreas: No ductal  dilatation or inflammation. Spleen: Normal in size without focal abnormality. Splenule anteriorly. Adrenals/Urinary Tract: No adrenal nodule. There is symmetric bilateral perinephric edema, chronic but slightly progressed from prior exam. No hydronephrosis. No renal calculi. Multiple bilateral renal cysts. 7 mm hyperdensity arising from the lower left kidney is at site of prior simple cyst and most consistent with intervening hemorrhage or complexity. Ureters are decompressed. No evidence of solid lesion.urinary bladder is minimally distended but thick walled. Wall thickening is most prominent about the dome. Similar findings were seen on prior exam. Stomach/Bowel: Stomach physiologically distended by enteric contrast. Normal small bowel without obstruction or inflammation. High-riding cecum in the right upper quadrant. The appendix is surgically absent with suture line at the base of the cecum. Colonic interposition under the right hemidiaphragm in coursing lateral to the liver, stable from prior exam. Moderate volume of colonic stool. Diverticulosis from the distal transverse through the sigmoid. No diverticulitis. No colonic inflammation. Vascular/Lymphatic: Advanced aortic and branch atherosclerosis. No aortic aneurysm. No portal venous or mesenteric gas. No abdominopelvic adenopathy. Reproductive: Mildly prominent prostate gland. Other: No ascites or free air. Musculoskeletal: Bones are under mineralized. Degenerative change in the spine. There are no acute or suspicious osseous abnormalities. IMPRESSION: 1. No acute findings in the abdomen pelvis. 2. Similar anterior bladder wall thickening to prior exam from 2020, presumably chronic. 3. Colonic diverticulosis without diverticulitis. 4. Slight capsular nodularity of lower liver contours, can be seen with cirrhosis. 5. Bilateral renal cysts, including a small hyperdense cyst in the lower left kidney. Aortic Atherosclerosis (ICD10-I70.0). Electronically Signed    By: Narda Rutherford M.D.   On: 03/24/2021 17:39   ASSESSMENT AND PLAN:  Brandon [name] is a 65 y.o. male with medical history significant for chronic hyponatremia with history of polydipsia, CAD s/p stent, hypertension, COPD not on oxygen, schizophrenia/bipolar disorder, and hyperlipidemia who presents with concerns of fall, weakness and dizziness.  Acute on chronic hyponatremia.   --The patient states he does drink a lot of Gatorade and water at home.  -- Continue fluid restriction.  Continue salt tablets.  --  Sodium 121 on presentation.  Sodium  128 today. -- Patient is alert and oriented times three. His sodium remains anywhere from 125 to 133  Epigastric abdominal pain and some constipation.   --Patient already on Protonix.  -- CT scan of the abdomen pelvis noted --pt had Large BM after suppository.  Frequent falls and vitamin D deficiency, generalized weakness.  --cont Supplement oral vitamin D.  Physical therapy recommending rehab.   --pt has bed at [facility]  Essential hypertension.  --cont low-dose Norvasc,Coreg Imdur and irbesartan  Chronic Schizophrenia.  Continue clozapine  COPD.  Respiratory status stable  Hyperlipidemia unspecified.  Continue atorvastatin  BPH on Flomax  History of CAD.  Continue Imdur, Coreg and atorvastatin    Family communication :none Consults : CODE STATUS: full DVT Prophylaxis :lovenox Level of care: Med-Surg Status is: Inpatient  Remains inpatient appropriate because: safe d/c paln  Dispo: The patient is from: Home              Anticipated d/c is to: SNF              Patient currently is medically stable to d/c.  Difficult to place patient No        TOTAL TIME TAKING CARE OF THIS PATIENT: 25 minutes.  >50% time spent on counselling and coordination of care  Note: This dictation was prepared with Dragon dictation along with smaller phrase technology. Any transcriptional errors that result from this process are  unintentional.  Enedina Finner M.D    [facility]   CC: Primary care physician; Sherron Monday, MD Patient ID: [name], male   DOB: 1956/04/13, 65 y.o.   MRN: [id number]

## 2021-03-25 NOTE — Progress Notes (Signed)
Pt discharged to rehab facility.  IV removed without complication.  AVS placed in packet.  All belongings at bedside taken with pt.  Pt transported to facility on stretcher by EMS.  Report called to [facility], LPN.

## 2021-03-25 NOTE — Care Management Important Message (Signed)
Important Message  Patient Details  Name: Brandon [name] MRN: [id number] Date of Birth: 05-02-1956   Medicare Important Message Given:  Yes     Olegario Messier A Kamira Mellette 03/25/2021, 2:39 PM

## 2021-03-25 NOTE — Progress Notes (Signed)
Clozapine per pharmacy consult  Pt on Clozapine 200mg  at bedtime per Med REc Current order for Clozapine 150 mg at bedtime  8/13  ANC= 5.1  Lab submitted to clozapine REMS per 9/13 RPh. Next ANC due 8/20  9/20 PharmD Clinical Pharmacist 03/25/2021

## 2021-03-26 DIAGNOSIS — E871 Hypo-osmolality and hyponatremia: Secondary | ICD-10-CM | POA: Diagnosis not present

## 2021-03-26 DIAGNOSIS — J449 Chronic obstructive pulmonary disease, unspecified: Secondary | ICD-10-CM | POA: Diagnosis not present

## 2021-03-26 DIAGNOSIS — I2510 Atherosclerotic heart disease of native coronary artery without angina pectoris: Secondary | ICD-10-CM | POA: Diagnosis not present

## 2021-03-26 DIAGNOSIS — F32A Depression, unspecified: Secondary | ICD-10-CM | POA: Diagnosis not present

## 2021-03-27 DIAGNOSIS — I2510 Atherosclerotic heart disease of native coronary artery without angina pectoris: Secondary | ICD-10-CM | POA: Diagnosis not present

## 2021-03-27 DIAGNOSIS — E871 Hypo-osmolality and hyponatremia: Secondary | ICD-10-CM | POA: Diagnosis not present

## 2021-03-27 DIAGNOSIS — M6281 Muscle weakness (generalized): Secondary | ICD-10-CM | POA: Diagnosis not present

## 2021-03-29 DIAGNOSIS — M6281 Muscle weakness (generalized): Secondary | ICD-10-CM | POA: Diagnosis not present

## 2021-03-29 DIAGNOSIS — E871 Hypo-osmolality and hyponatremia: Secondary | ICD-10-CM | POA: Diagnosis not present

## 2021-03-29 DIAGNOSIS — I2510 Atherosclerotic heart disease of native coronary artery without angina pectoris: Secondary | ICD-10-CM | POA: Diagnosis not present

## 2021-04-01 DIAGNOSIS — M6281 Muscle weakness (generalized): Secondary | ICD-10-CM | POA: Diagnosis not present

## 2021-04-01 DIAGNOSIS — I2510 Atherosclerotic heart disease of native coronary artery without angina pectoris: Secondary | ICD-10-CM | POA: Diagnosis not present

## 2021-04-01 DIAGNOSIS — E871 Hypo-osmolality and hyponatremia: Secondary | ICD-10-CM | POA: Diagnosis not present

## 2021-04-02 DIAGNOSIS — I2510 Atherosclerotic heart disease of native coronary artery without angina pectoris: Secondary | ICD-10-CM | POA: Diagnosis not present

## 2021-04-02 DIAGNOSIS — E871 Hypo-osmolality and hyponatremia: Secondary | ICD-10-CM | POA: Diagnosis not present

## 2021-04-02 DIAGNOSIS — M6281 Muscle weakness (generalized): Secondary | ICD-10-CM | POA: Diagnosis not present

## 2021-04-03 ENCOUNTER — Inpatient Hospital Stay
Admission: EM | Admit: 2021-04-03 | Discharge: 2021-04-11 | DRG: 643 | Disposition: A | Discharge status: Skilled Nursing Facility | Payer: Medicare Other | Attending: Internal Medicine | Admitting: Internal Medicine

## 2021-04-03 ENCOUNTER — Encounter: Payer: Self-pay | Admitting: Emergency Medicine

## 2021-04-03 ENCOUNTER — Other Ambulatory Visit: Payer: Self-pay

## 2021-04-03 DIAGNOSIS — J9690 Respiratory failure, unspecified, unspecified whether with hypoxia or hypercapnia: Secondary | ICD-10-CM | POA: Diagnosis not present

## 2021-04-03 DIAGNOSIS — Z20822 Contact with and (suspected) exposure to covid-19: Secondary | ICD-10-CM | POA: Diagnosis present

## 2021-04-03 DIAGNOSIS — J441 Chronic obstructive pulmonary disease with (acute) exacerbation: Secondary | ICD-10-CM | POA: Diagnosis present

## 2021-04-03 DIAGNOSIS — J181 Lobar pneumonia, unspecified organism: Secondary | ICD-10-CM

## 2021-04-03 DIAGNOSIS — Z4682 Encounter for fitting and adjustment of non-vascular catheter: Secondary | ICD-10-CM | POA: Diagnosis not present

## 2021-04-03 DIAGNOSIS — Z87891 Personal history of nicotine dependence: Secondary | ICD-10-CM

## 2021-04-03 DIAGNOSIS — Z79899 Other long term (current) drug therapy: Secondary | ICD-10-CM

## 2021-04-03 DIAGNOSIS — R631 Polydipsia: Secondary | ICD-10-CM | POA: Diagnosis present

## 2021-04-03 DIAGNOSIS — I252 Old myocardial infarction: Secondary | ICD-10-CM

## 2021-04-03 DIAGNOSIS — G319 Degenerative disease of nervous system, unspecified: Secondary | ICD-10-CM | POA: Diagnosis not present

## 2021-04-03 DIAGNOSIS — E876 Hypokalemia: Secondary | ICD-10-CM

## 2021-04-03 DIAGNOSIS — J9600 Acute respiratory failure, unspecified whether with hypoxia or hypercapnia: Secondary | ICD-10-CM

## 2021-04-03 DIAGNOSIS — F32A Depression, unspecified: Secondary | ICD-10-CM | POA: Diagnosis not present

## 2021-04-03 DIAGNOSIS — I2510 Atherosclerotic heart disease of native coronary artery without angina pectoris: Secondary | ICD-10-CM | POA: Diagnosis present

## 2021-04-03 DIAGNOSIS — Z888 Allergy status to other drugs, medicaments and biological substances status: Secondary | ICD-10-CM

## 2021-04-03 DIAGNOSIS — D508 Other iron deficiency anemias: Secondary | ICD-10-CM | POA: Diagnosis not present

## 2021-04-03 DIAGNOSIS — J9602 Acute respiratory failure with hypercapnia: Secondary | ICD-10-CM | POA: Diagnosis not present

## 2021-04-03 DIAGNOSIS — G40909 Epilepsy, unspecified, not intractable, without status epilepticus: Secondary | ICD-10-CM | POA: Diagnosis present

## 2021-04-03 DIAGNOSIS — G928 Other toxic encephalopathy: Secondary | ICD-10-CM | POA: Diagnosis not present

## 2021-04-03 DIAGNOSIS — N400 Enlarged prostate without lower urinary tract symptoms: Secondary | ICD-10-CM | POA: Diagnosis present

## 2021-04-03 DIAGNOSIS — Z91041 Radiographic dye allergy status: Secondary | ICD-10-CM

## 2021-04-03 DIAGNOSIS — N281 Cyst of kidney, acquired: Secondary | ICD-10-CM | POA: Diagnosis not present

## 2021-04-03 DIAGNOSIS — R0600 Dyspnea, unspecified: Secondary | ICD-10-CM

## 2021-04-03 DIAGNOSIS — F250 Schizoaffective disorder, bipolar type: Secondary | ICD-10-CM | POA: Diagnosis present

## 2021-04-03 DIAGNOSIS — I700 Atherosclerosis of aorta: Secondary | ICD-10-CM | POA: Diagnosis not present

## 2021-04-03 DIAGNOSIS — R531 Weakness: Secondary | ICD-10-CM

## 2021-04-03 DIAGNOSIS — R748 Abnormal levels of other serum enzymes: Secondary | ICD-10-CM | POA: Diagnosis not present

## 2021-04-03 DIAGNOSIS — Z7951 Long term (current) use of inhaled steroids: Secondary | ICD-10-CM

## 2021-04-03 DIAGNOSIS — I10 Essential (primary) hypertension: Secondary | ICD-10-CM | POA: Diagnosis not present

## 2021-04-03 DIAGNOSIS — M5136 Other intervertebral disc degeneration, lumbar region: Secondary | ICD-10-CM | POA: Diagnosis not present

## 2021-04-03 DIAGNOSIS — Z4659 Encounter for fitting and adjustment of other gastrointestinal appliance and device: Secondary | ICD-10-CM

## 2021-04-03 DIAGNOSIS — J9601 Acute respiratory failure with hypoxia: Secondary | ICD-10-CM

## 2021-04-03 DIAGNOSIS — M5137 Other intervertebral disc degeneration, lumbosacral region: Secondary | ICD-10-CM | POA: Diagnosis not present

## 2021-04-03 DIAGNOSIS — B370 Candidal stomatitis: Secondary | ICD-10-CM | POA: Diagnosis not present

## 2021-04-03 DIAGNOSIS — M6281 Muscle weakness (generalized): Secondary | ICD-10-CM | POA: Diagnosis not present

## 2021-04-03 DIAGNOSIS — Z955 Presence of coronary angioplasty implant and graft: Secondary | ICD-10-CM

## 2021-04-03 DIAGNOSIS — R2681 Unsteadiness on feet: Secondary | ICD-10-CM | POA: Diagnosis not present

## 2021-04-03 DIAGNOSIS — Z8249 Family history of ischemic heart disease and other diseases of the circulatory system: Secondary | ICD-10-CM

## 2021-04-03 DIAGNOSIS — H409 Unspecified glaucoma: Secondary | ICD-10-CM | POA: Diagnosis present

## 2021-04-03 DIAGNOSIS — I110 Hypertensive heart disease with heart failure: Secondary | ICD-10-CM | POA: Diagnosis not present

## 2021-04-03 DIAGNOSIS — R0602 Shortness of breath: Secondary | ICD-10-CM | POA: Diagnosis not present

## 2021-04-03 DIAGNOSIS — E1010 Type 1 diabetes mellitus with ketoacidosis without coma: Secondary | ICD-10-CM | POA: Diagnosis not present

## 2021-04-03 DIAGNOSIS — D649 Anemia, unspecified: Secondary | ICD-10-CM | POA: Diagnosis not present

## 2021-04-03 DIAGNOSIS — N179 Acute kidney failure, unspecified: Secondary | ICD-10-CM | POA: Diagnosis present

## 2021-04-03 DIAGNOSIS — J449 Chronic obstructive pulmonary disease, unspecified: Secondary | ICD-10-CM | POA: Diagnosis not present

## 2021-04-03 DIAGNOSIS — Z91048 Other nonmedicinal substance allergy status: Secondary | ICD-10-CM

## 2021-04-03 DIAGNOSIS — Z743 Need for continuous supervision: Secondary | ICD-10-CM | POA: Diagnosis not present

## 2021-04-03 DIAGNOSIS — Z978 Presence of other specified devices: Secondary | ICD-10-CM

## 2021-04-03 DIAGNOSIS — Z789 Other specified health status: Secondary | ICD-10-CM | POA: Diagnosis not present

## 2021-04-03 DIAGNOSIS — R5381 Other malaise: Secondary | ICD-10-CM | POA: Diagnosis not present

## 2021-04-03 DIAGNOSIS — K4090 Unilateral inguinal hernia, without obstruction or gangrene, not specified as recurrent: Secondary | ICD-10-CM | POA: Diagnosis not present

## 2021-04-03 DIAGNOSIS — E785 Hyperlipidemia, unspecified: Secondary | ICD-10-CM | POA: Diagnosis present

## 2021-04-03 DIAGNOSIS — Z89422 Acquired absence of other left toe(s): Secondary | ICD-10-CM

## 2021-04-03 DIAGNOSIS — J690 Pneumonitis due to inhalation of food and vomit: Secondary | ICD-10-CM | POA: Diagnosis not present

## 2021-04-03 DIAGNOSIS — K5730 Diverticulosis of large intestine without perforation or abscess without bleeding: Secondary | ICD-10-CM | POA: Diagnosis not present

## 2021-04-03 DIAGNOSIS — H4010X0 Unspecified open-angle glaucoma, stage unspecified: Secondary | ICD-10-CM | POA: Diagnosis not present

## 2021-04-03 DIAGNOSIS — J9811 Atelectasis: Secondary | ICD-10-CM | POA: Diagnosis not present

## 2021-04-03 DIAGNOSIS — E119 Type 2 diabetes mellitus without complications: Secondary | ICD-10-CM | POA: Diagnosis not present

## 2021-04-03 DIAGNOSIS — E871 Hypo-osmolality and hyponatremia: Secondary | ICD-10-CM

## 2021-04-03 DIAGNOSIS — E222 Syndrome of inappropriate secretion of antidiuretic hormone: Secondary | ICD-10-CM | POA: Diagnosis not present

## 2021-04-03 DIAGNOSIS — I209 Angina pectoris, unspecified: Secondary | ICD-10-CM | POA: Diagnosis not present

## 2021-04-03 DIAGNOSIS — N3289 Other specified disorders of bladder: Secondary | ICD-10-CM | POA: Diagnosis not present

## 2021-04-03 DIAGNOSIS — K2100 Gastro-esophageal reflux disease with esophagitis, without bleeding: Secondary | ICD-10-CM | POA: Diagnosis not present

## 2021-04-03 DIAGNOSIS — J984 Other disorders of lung: Secondary | ICD-10-CM | POA: Diagnosis not present

## 2021-04-03 DIAGNOSIS — D509 Iron deficiency anemia, unspecified: Secondary | ICD-10-CM | POA: Diagnosis not present

## 2021-04-03 DIAGNOSIS — I4891 Unspecified atrial fibrillation: Secondary | ICD-10-CM | POA: Diagnosis not present

## 2021-04-03 LAB — CBC WITH DIFFERENTIAL/PLATELET
Abs Immature Granulocytes: 0.04 10*3/uL (ref 0.00–0.07)
Basophils Absolute: 0 10*3/uL (ref 0.0–0.1)
Basophils Relative: 1 %
Eosinophils Absolute: 0.1 10*3/uL (ref 0.0–0.5)
Eosinophils Relative: 1 %
HCT: 34.6 % — ABNORMAL LOW (ref 39.0–52.0)
Hemoglobin: 11.9 g/dL — ABNORMAL LOW (ref 13.0–17.0)
Immature Granulocytes: 1 %
Lymphocytes Relative: 14 %
Lymphs Abs: 1 10*3/uL (ref 0.7–4.0)
MCH: 28.6 pg (ref 26.0–34.0)
MCHC: 34.4 g/dL (ref 30.0–36.0)
MCV: 83.2 fL (ref 80.0–100.0)
Monocytes Absolute: 0.7 10*3/uL (ref 0.1–1.0)
Monocytes Relative: 10 %
Neutro Abs: 5.5 10*3/uL (ref 1.7–7.7)
Neutrophils Relative %: 73 %
Platelets: 278 10*3/uL (ref 150–400)
RBC: 4.16 MIL/uL — ABNORMAL LOW (ref 4.22–5.81)
RDW: 12.9 % (ref 11.5–15.5)
WBC: 7.4 10*3/uL (ref 4.0–10.5)
nRBC: 0 % (ref 0.0–0.2)

## 2021-04-03 LAB — CBC
HCT: 34.3 % — ABNORMAL LOW (ref 39.0–52.0)
Hemoglobin: 12 g/dL — ABNORMAL LOW (ref 13.0–17.0)
MCH: 29.1 pg (ref 26.0–34.0)
MCHC: 35 g/dL (ref 30.0–36.0)
MCV: 83.1 fL (ref 80.0–100.0)
Platelets: 263 10*3/uL (ref 150–400)
RBC: 4.13 MIL/uL — ABNORMAL LOW (ref 4.22–5.81)
RDW: 12.8 % (ref 11.5–15.5)
WBC: 7.5 10*3/uL (ref 4.0–10.5)
nRBC: 0 % (ref 0.0–0.2)

## 2021-04-03 LAB — CBG MONITORING, ED: Glucose-Capillary: 110 mg/dL — ABNORMAL HIGH (ref 70–99)

## 2021-04-03 LAB — URINALYSIS, COMPLETE (UACMP) WITH MICROSCOPIC
Bilirubin Urine: NEGATIVE
Glucose, UA: NEGATIVE mg/dL
Hgb urine dipstick: NEGATIVE
Ketones, ur: NEGATIVE mg/dL
Leukocytes,Ua: NEGATIVE
Nitrite: NEGATIVE
Protein, ur: NEGATIVE mg/dL
Specific Gravity, Urine: 1.008 (ref 1.005–1.030)
Squamous Epithelial / HPF: NONE SEEN (ref 0–5)
pH: 7 (ref 5.0–8.0)

## 2021-04-03 LAB — OSMOLALITY: Osmolality: 242 mOsm/kg — CL (ref 275–295)

## 2021-04-03 LAB — BASIC METABOLIC PANEL
Anion gap: 7 (ref 5–15)
BUN: 9 mg/dL (ref 8–23)
CO2: 30 mmol/L (ref 22–32)
Calcium: 8.4 mg/dL — ABNORMAL LOW (ref 8.9–10.3)
Chloride: 82 mmol/L — ABNORMAL LOW (ref 98–111)
Creatinine, Ser: 0.49 mg/dL — ABNORMAL LOW (ref 0.61–1.24)
GFR, Estimated: >60 mL/min (ref >60)
Glucose, Bld: 113 mg/dL — ABNORMAL HIGH (ref 70–99)
Potassium: 3.4 mmol/L — ABNORMAL LOW (ref 3.5–5.1)
Sodium: 119 mmol/L — CL (ref 135–145)

## 2021-04-03 LAB — OSMOLALITY, URINE: Osmolality, Ur: 304 mOsm/kg (ref 300–900)

## 2021-04-03 LAB — MAGNESIUM: Magnesium: 1.9 mg/dL (ref 1.7–2.4)

## 2021-04-03 MED ORDER — ISOSORBIDE MONONITRATE ER 30 MG PO TB24
30.0000 mg | ORAL_TABLET | Freq: Two times a day (BID) | ORAL | Status: DC
  Administered 2021-04-03 – 2021-04-11 (×13): 30 mg via ORAL
  Filled 2021-04-03 (×14): qty 1

## 2021-04-03 MED ORDER — ACETAMINOPHEN 325 MG PO TABS: 650.0000 mg | ORAL_TABLET | ORAL | Status: DC | PRN

## 2021-04-03 MED ORDER — ACETAMINOPHEN 325 MG PO TABS
650.0000 mg | ORAL_TABLET | Freq: Four times a day (QID) | ORAL | Status: DC | PRN
  Administered 2021-04-09: 650 mg via ORAL
  Filled 2021-04-03: qty 2

## 2021-04-03 MED ORDER — POTASSIUM CHLORIDE 20 MEQ PO PACK
40.0000 meq | PACK | Freq: Once | ORAL | Status: AC
  Administered 2021-04-04: 40 meq via ORAL
  Filled 2021-04-03: qty 2

## 2021-04-03 MED ORDER — DORZOLAMIDE HCL-TIMOLOL MAL 2-0.5 % OP SOLN
1.0000 [drp] | Freq: Two times a day (BID) | OPHTHALMIC | Status: DC
  Administered 2021-04-03 – 2021-04-11 (×15): 1 [drp] via OPHTHALMIC
  Filled 2021-04-03 (×2): qty 10

## 2021-04-03 MED ORDER — CLOZAPINE 100 MG PO TABS
100.0000 mg | ORAL_TABLET | Freq: Every day | ORAL | Status: DC
  Administered 2021-04-03: 100 mg via ORAL
  Filled 2021-04-03 (×3): qty 1

## 2021-04-03 MED ORDER — ENOXAPARIN SODIUM 40 MG/0.4ML IJ SOSY
40.0000 mg | PREFILLED_SYRINGE | INTRAMUSCULAR | Status: DC
  Administered 2021-04-03 – 2021-04-10 (×8): 40 mg via SUBCUTANEOUS
  Filled 2021-04-03 (×9): qty 0.4

## 2021-04-03 MED ORDER — MAGNESIUM HYDROXIDE 400 MG/5ML PO SUSP: 30.0000 mL | Freq: Every day | ORAL | Status: DC | PRN

## 2021-04-03 MED ORDER — AMLODIPINE BESYLATE 5 MG PO TABS
5.0000 mg | ORAL_TABLET | Freq: Every day | ORAL | Status: DC
  Administered 2021-04-04 – 2021-04-11 (×6): 5 mg via ORAL
  Filled 2021-04-03 (×7): qty 1

## 2021-04-03 MED ORDER — ONDANSETRON HCL 4 MG/2ML IJ SOLN: 4.0000 mg | Freq: Four times a day (QID) | INTRAMUSCULAR | Status: DC | PRN

## 2021-04-03 MED ORDER — HYDROXYZINE PAMOATE 25 MG PO CAPS
25.0000 mg | ORAL_CAPSULE | Freq: Two times a day (BID) | ORAL | Status: DC | PRN
  Filled 2021-04-03: qty 1

## 2021-04-03 MED ORDER — TAMSULOSIN HCL 0.4 MG PO CAPS
0.4000 mg | ORAL_CAPSULE | Freq: Every day | ORAL | Status: DC
  Administered 2021-04-04 – 2021-04-11 (×7): 0.4 mg via ORAL
  Filled 2021-04-03 (×8): qty 1

## 2021-04-03 MED ORDER — SODIUM CHLORIDE 1 G PO TABS
1.0000 g | ORAL_TABLET | Freq: Three times a day (TID) | ORAL | Status: DC
  Administered 2021-04-04 – 2021-04-11 (×21): 1 g via ORAL
  Filled 2021-04-03 (×26): qty 1

## 2021-04-03 MED ORDER — MOMETASONE FURO-FORMOTEROL FUM 100-5 MCG/ACT IN AERO
2.0000 | INHALATION_SPRAY | Freq: Two times a day (BID) | RESPIRATORY_TRACT | Status: DC
  Administered 2021-04-03 – 2021-04-05 (×5): 2 via RESPIRATORY_TRACT
  Filled 2021-04-03: qty 8.8

## 2021-04-03 MED ORDER — ATORVASTATIN CALCIUM 20 MG PO TABS
20.0000 mg | ORAL_TABLET | Freq: Every day | ORAL | Status: DC
  Administered 2021-04-03 – 2021-04-10 (×7): 20 mg via ORAL
  Filled 2021-04-03 (×7): qty 1

## 2021-04-03 MED ORDER — SODIUM CHLORIDE 0.9 % IV SOLN
INTRAVENOUS | Status: DC
Start: 2021-04-03 — End: 2021-04-05

## 2021-04-03 MED ORDER — SIMVASTATIN 10 MG PO TABS: 40.0000 mg | ORAL_TABLET | Freq: Every day | ORAL | Status: DC

## 2021-04-03 MED ORDER — ACETAMINOPHEN 650 MG RE SUPP: 650.0000 mg | Freq: Four times a day (QID) | RECTAL | Status: DC | PRN

## 2021-04-03 MED ORDER — CLOZAPINE 100 MG PO TABS: 200.0000 mg | ORAL_TABLET | Freq: Every day | ORAL | Status: DC

## 2021-04-03 MED ORDER — CARVEDILOL 25 MG PO TABS
25.0000 mg | ORAL_TABLET | Freq: Two times a day (BID) | ORAL | Status: DC
  Administered 2021-04-04 – 2021-04-11 (×12): 25 mg via ORAL
  Filled 2021-04-03 (×13): qty 1

## 2021-04-03 MED ORDER — TRAZODONE HCL 50 MG PO TABS: 25.0000 mg | ORAL_TABLET | Freq: Every evening | ORAL | Status: DC | PRN

## 2021-04-03 MED ORDER — LATANOPROST 0.005 % OP SOLN
1.0000 [drp] | Freq: Every day | OPHTHALMIC | Status: DC
  Administered 2021-04-03 – 2021-04-10 (×8): 1 [drp] via OPHTHALMIC
  Filled 2021-04-03 (×2): qty 2.5

## 2021-04-03 MED ORDER — ONDANSETRON HCL 4 MG PO TABS: 4.0000 mg | ORAL_TABLET | Freq: Four times a day (QID) | ORAL | Status: DC | PRN

## 2021-04-03 MED ORDER — PANTOPRAZOLE SODIUM 40 MG PO TBEC
40.0000 mg | DELAYED_RELEASE_TABLET | Freq: Two times a day (BID) | ORAL | Status: DC
  Administered 2021-04-03 – 2021-04-11 (×14): 40 mg via ORAL
  Filled 2021-04-03 (×15): qty 1

## 2021-04-03 MED ORDER — SENNA 8.6 MG PO TABS
1.0000 | ORAL_TABLET | Freq: Every day | ORAL | Status: DC | PRN
  Administered 2021-04-05 – 2021-04-07 (×2): 8.6 mg via ORAL
  Filled 2021-04-03 (×2): qty 2
  Filled 2021-04-03: qty 1

## 2021-04-03 MED ORDER — EZETIMIBE 10 MG PO TABS
10.0000 mg | ORAL_TABLET | Freq: Every day | ORAL | Status: DC
  Administered 2021-04-03 – 2021-04-10 (×7): 10 mg via ORAL
  Filled 2021-04-03 (×10): qty 1

## 2021-04-03 MED ORDER — NITROGLYCERIN 0.4 MG SL SUBL: 0.4000 mg | SUBLINGUAL_TABLET | SUBLINGUAL | Status: DC | PRN

## 2021-04-03 MED ORDER — BUPROPION HCL ER (SR) 100 MG PO TB12
100.0000 mg | ORAL_TABLET | Freq: Every day | ORAL | Status: DC
  Administered 2021-04-04 – 2021-04-11 (×7): 100 mg via ORAL
  Filled 2021-04-03 (×8): qty 1

## 2021-04-03 MED ORDER — IRBESARTAN 75 MG PO TABS
37.5000 mg | ORAL_TABLET | Freq: Every day | ORAL | Status: DC
  Administered 2021-04-04 – 2021-04-11 (×6): 37.5 mg via ORAL
  Filled 2021-04-03 (×8): qty 0.5

## 2021-04-03 MED ORDER — VITAMIN D 25 MCG (1000 UNIT) PO TABS
1000.0000 [IU] | ORAL_TABLET | Freq: Every day | ORAL | Status: DC
  Administered 2021-04-04 – 2021-04-11 (×7): 1000 [IU] via ORAL
  Filled 2021-04-03 (×9): qty 1

## 2021-04-03 NOTE — ED Notes (Signed)
MD at the bedside  

## 2021-04-03 NOTE — Progress Notes (Signed)
PHARMACIST - PHYSICIAN COMMUNICATION  CONCERNING:  Simvastatin dose with amlodipine   RECOMMENDATION: Patient was prescribed simvastatin 40 mg qHS (home regimen)  Dose of simvastatin should not exceed 20 mg daily if coadministering with amlodipine due to increased risk of rhabdomyolysis   DESCRIPTION: Pharmacy has substituted equivalent dose of atorvastatin   Patient is now receiving atorvastatin 20 mg qHS    Sharen Hones, PharmD, BCPS Clinical Pharmacist  04/03/2021 9:50 PM

## 2021-04-03 NOTE — Consult Note (Addendum)
Pharmacy - Clozapine     This patient's order has been reviewed for prescribing contraindications.   Clozapine REMS enrollment Verified: yes  Home Regimen:  Clozapine 150 mg qHS Missed doses: no   Inpatient regimen ordered:  Clozapine 100 mg qHS  Dose Adjustments This Admission:   Labs:   8/24 ANC = 5500    Plan: Continue with ordered reduced dose of clozapine 100 mg q HS Continue with weekly ANC labs while inpatient  **The medication is being dispensed pursuant to the FDA REMS suspension order of 06/29/20 that allows for dispensing without a patient REMS dispense authorization (RDA).    Sharen Hones, PharmD, BCPS Clinical Pharmacist

## 2021-04-03 NOTE — ED Provider Notes (Signed)
[facility]  ____________________________________________   Event Date/Time   First MD Initiated Contact with Patient 04/03/21 1937     (approximate)  I have reviewed the triage vital signs and the nursing notes.   HISTORY  Chief Complaint abnormal labs and Weakness    HPI Brandon [name] is a 65 y.o. male with history of schizophrenia, chronic hyponatremia and polydipsia, CAD, who presents with generalized fatigue and hyponatremia.  Patient was recently admitted for sodium of 121, while in the hospital he was fluid restricted and given salt tabs.  Sodium around 125 at baseline.  Lab work done at the facility was low so he was brought to the ER.  Patient tells me he feels fatigued and globally weak.  Symptoms starting today.  He has some dyspnea but denies chest pain.  Also with abdominal cramping, no nausea or vomiting or diarrhea.  Patient tells me he has not had much appetite.  He tells me he is not drinking as much fluid as he was previously prior to admission.      Past Medical History:  Diagnosis Date   Anemia    IDA   Colon polyps    COPD (chronic obstructive pulmonary disease) (HCC)    Coronary artery disease    Heart attack (HCC) 2005   Hiatal hernia    History of ETOH abuse    Hyperlipidemia    Hypertension    Pleurisy    Schizophrenia (HCC)    Schizophrenia (HCC)    Seizures (HCC)    grand mal in 2000 or 2001 recovery from alcoholism   Suicide attempt [facility])     Patient Active Problem List   Diagnosis Date Noted   Vitamin D deficiency    Epigastric abdominal pain    Recurrent falls    Hyponatremia 03/22/2021   BPH (benign prostatic hyperplasia) 03/22/2021   Weakness    Schizophrenia (HCC)    Acute hyponatremia 11/27/2020   Appendicitis with perforation 07/17/2018   Acute cholecystitis 04/10/2018   Chronic cholecystitis 04/08/2018   Atherosclerosis of native arteries of extremity with intermittent claudication (HCC) 11/22/2017    Essential hypertension 11/22/2017   Hyperlipidemia 11/22/2017   COPD (chronic obstructive pulmonary disease) (HCC) 11/22/2017   CAD (coronary artery disease) 10/15/2017   Other toe(s) amputation status 11/17/2014   Schizoaffective disorder, bipolar type (HCC) 08/23/2008    Past Surgical History:  Procedure Laterality Date   ABDOMINAL SURGERY     internal bleeding   CHOLECYSTECTOMY N/A 05/04/2018   Procedure: LAPAROSCOPIC CHOLECYSTECTOMY, converted to open;  Surgeon: Sung Amabile, DO;  Location: [facility] ORS;  Service: General;  Laterality: N/A;   COLONOSCOPY     COLONOSCOPY WITH PROPOFOL N/A 05/09/2016   Procedure: COLONOSCOPY WITH PROPOFOL;  Surgeon: Scot Jun, MD;  Location: [facility] ENDOSCOPY;  Service: Endoscopy;  Laterality: N/A;   CORONARY ANGIOPLASTY WITH STENT PLACEMENT     1 vessel   ESOPHAGOGASTRODUODENOSCOPY (EGD) WITH PROPOFOL N/A 05/09/2016   Procedure: ESOPHAGOGASTRODUODENOSCOPY (EGD) WITH PROPOFOL;  Surgeon: Scot Jun, MD;  Location: [facility] ENDOSCOPY;  Service: Endoscopy;  Laterality: N/A;   EYE SURGERY Right    GLAUCOMA SURGERY     LAPAROSCOPIC APPENDECTOMY N/A 07/17/2018   Procedure: APPENDECTOMY LAPAROSCOPIC;  Surgeon: Sung Amabile, DO;  Location: [facility] ORS;  Service: General;  Laterality: N/A;   LEFT HEART CATH Right 10/19/2017   Procedure: Left Heart Cath and Coronary Angiography;  Surgeon: Laurier Nancy, MD;  Location: [facility] INVASIVE CV LAB;  Service: Cardiovascular;  Laterality: Right;   NOSE SURGERY     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   VASECTOMY      Prior to Admission medications   Medication Sig Start Date End Date Taking? Authorizing Provider  acetaminophen (TYLENOL) 325 MG tablet Take 650 mg by mouth every 4 (four) hours as needed for mild pain or moderate pain.    [provider]  amLODipine (NORVASC) 5 MG tablet Take 1 tablet (5 mg total) by mouth daily. 03/26/21   Enedina Finner, MD  buPROPion [facility] SR) 100 MG 12 hr tablet Take 100 mg by  mouth daily.     [provider]  carvedilol (COREG) 25 MG tablet Take 25 mg by mouth 2 (two) times daily with a meal.    [provider]  cholecalciferol (VITAMIN D) 25 MCG tablet Take 1 tablet (1,000 Units total) by mouth daily. 03/26/21   Enedina Finner, MD  cloZAPine (CLOZARIL) 100 MG tablet Take 200 mg by mouth at bedtime.     [provider]  dorzolamide-timolol (COSOPT) 22.3-6.8 MG/ML ophthalmic solution Place 1 drop into both eyes 2 (two) times daily.    [provider]  ezetimibe (ZETIA) 10 MG tablet Take 10 mg by mouth at bedtime.     [provider]  hydrOXYzine (VISTARIL) 25 MG capsule Take 25 mg by mouth 2 (two) times daily as needed for anxiety.    [provider]  isosorbide mononitrate (IMDUR) 30 MG 24 hr tablet Take 30 mg by mouth 2 (two) times daily.     [provider]  latanoprost (XALATAN) 0.005 % ophthalmic solution Place 1 drop into both eyes at bedtime. 09/15/17   [provider]  nitroGLYCERIN (NITROSTAT) 0.4 MG SL tablet Place 0.4 mg under the tongue every 5 (five) minutes as needed for chest pain.     [provider]  olmesartan (BENICAR) 20 MG tablet Take 20 mg by mouth daily.    [provider]  pantoprazole (PROTONIX) 40 MG tablet Take 40 mg by mouth 2 (two) times daily.    [provider]  senna (SENOKOT) 8.6 MG tablet Take 1-2 tablets by mouth daily as needed for constipation.    [provider]  simvastatin (ZOCOR) 40 MG tablet Take 40 mg by mouth at bedtime.     [provider]  sodium chloride 1 g tablet Take 1 tablet (1 g total) by mouth 3 (three) times daily with meals. 03/25/21   Enedina Finner, MD  SYMBICORT 80-4.5 MCG/ACT inhaler Inhale 2 puffs into the lungs 2 (two) times daily. 09/15/17   [provider]  tamsulosin (FLOMAX) 0.4 MG CAPS capsule Take 0.4 mg by mouth daily.     [provider]    Allergies Compazine [prochlorperazine],  Ivp dye [iodinated diagnostic agents], Alcohol-sulfur [elemental sulfur], Benadryl [diphenhydramine], Depakote [divalproex sodium], Dramamine [dimenhydrinate], Plavix [clopidogrel], and Rosuvastatin  Family History  Problem Relation Age of Onset   Lung cancer Mother    Hypertension Father    Heart attack Father    CAD Father    Prostate cancer Neg Hx    Bladder Cancer Neg Hx    Kidney cancer Neg Hx     Social History Social History   Tobacco Use   Smoking status: Former    Packs/day: 1.00    Types: Cigarettes    Quit date: 2012    Years since quitting: 10.6   Smokeless tobacco: Former  Building services engineer Use:  Never used  Substance Use Topics   Alcohol use: No    Comment: no alcohol since 2010   Drug use: No    Review of Systems   Review of Systems  Constitutional:  Positive for appetite change and fatigue. Negative for fever.  Eyes:  Negative for visual disturbance.  Respiratory:  Positive for shortness of breath. Negative for cough.   Cardiovascular:  Negative for chest pain.  Gastrointestinal:  Negative for diarrhea, nausea and vomiting.  Endocrine: Negative for polydipsia.  Genitourinary:  Negative for dysuria.  Neurological:  Positive for weakness. Negative for headaches.  All other systems reviewed and are negative.  Physical Exam Updated Vital Signs BP (!) 122/92   Pulse 79   Temp 97.7 F (36.5 C) (Oral)   Resp (!) 22   Ht 6' (1.829 m)   Wt 82.6 kg   SpO2 96%   BMI 24.68 kg/m   Physical Exam Vitals and nursing note reviewed.  Constitutional:      General: He is not in acute distress.    Appearance: Normal appearance.     Comments: Patient is chronically ill-appearing but not in any distress  HENT:     Head: Normocephalic and atraumatic.  Eyes:     General: No scleral icterus.    Conjunctiva/sclera: Conjunctivae normal.  Pulmonary:     Effort: Pulmonary effort is normal. No respiratory distress.     Breath sounds: Normal breath sounds. No  wheezing.  Abdominal:     General: Abdomen is flat. There is no distension.     Palpations: Abdomen is soft.     Tenderness: There is no abdominal tenderness. There is no guarding.  Musculoskeletal:        General: No deformity or signs of injury.     Cervical back: Normal range of motion.  Skin:    Coloration: Skin is not jaundiced or pale.  Neurological:     General: No focal deficit present.     Mental Status: He is alert and oriented to person, place, and time. Mental status is at baseline.     Comments: Patient is alert and oriented x3, somewhat slow with his speech To 5 strength in bilateral upper and lower extremities  Psychiatric:        Mood and Affect: Mood normal.        Behavior: Behavior normal.     LABS (all labs ordered are listed, but only abnormal results are displayed)  Labs Reviewed  BASIC METABOLIC PANEL - Abnormal; Notable for the following components:      Result Value   Sodium 119 (*)    Potassium 3.4 (*)    Chloride 82 (*)    Glucose, Bld 113 (*)    Creatinine, Ser 0.49 (*)    Calcium 8.4 (*)    All other components within normal limits  CBC - Abnormal; Notable for the following components:   RBC 4.13 (*)    Hemoglobin 12.0 (*)    HCT 34.3 (*)    All other components within normal limits  CBG MONITORING, ED - Abnormal; Notable for the following components:   Glucose-Capillary 110 (*)    All other components within normal limits  URINALYSIS, COMPLETE (UACMP) WITH MICROSCOPIC  OSMOLALITY, URINE  SODIUM, URINE, RANDOM  OSMOLALITY   ____________________________________________  EKG  Normal sinus rhythm, normal axis, normal intervals, LVH with repolarization abnormality, no acute ischemic changes ____________________________________________  RADIOLOGY Ky BarbanI, Rj Pedrosa Rose Mcugh, personally viewed and evaluated these images (plain radiographs)  as part of my medical decision making, as well as reviewing the written report by the radiologist.  ED MD  interpretation:  n/a    ____________________________________________   PROCEDURES  Procedure(s) performed (including Critical Care):  Procedures   ____________________________________________   INITIAL IMPRESSION / ASSESSMENT AND PLAN / ED COURSE     Patient is a 64 year old male with chronic hyponatremia who presents from his facility due to worsening hyponatremia.  Sodium is 119.  His baseline is in the mid 120s.  Patient is symptomatic with fatigue and generalized weakness but no focal deficits.  Patient has had polydipsia in the past.  I suspect that this is what is going on again versus SIADH.  We will send serum and urine awesome's and urine sodium.  We will not give any fluids at this time.  Patient made NPO.  We will continue to monitor.  Will require admission.      ____________________________________________   FINAL CLINICAL IMPRESSION(S) / ED DIAGNOSES  Final diagnoses:  Hyponatremia     ED Discharge Orders     None        Note:  This document was prepared using Dragon voice recognition software and may include unintentional dictation errors.    Georga Hacking, MD 04/03/21 2009

## 2021-04-03 NOTE — ED Notes (Signed)
Patient transported to the floor via hospital bed by transport team. Stable at transfer

## 2021-04-03 NOTE — ED Notes (Signed)
Assisted pt with a urinal to attempt to get a urine sample. Pt is unable to produce urine at this time.

## 2021-04-03 NOTE — H&P (Addendum)
[place]   PATIENT NAME: Brandon CapronRobert Olsson    MR#:  [id number]  DATE OF BIRTH:  07/28/1956  DATE OF ADMISSION:  04/03/2021  PRIMARY CARE PHYSICIAN: Sherron Mondayejan-Sie, S Ahmed, MD   Patient is coming from: [facility].  REQUESTING/REFERRING PHYSICIAN: Georga HackingMcHugh, Kelly Rose, MD  CHIEF COMPLAINT:   Chief Complaint  Patient presents with  . abnormal labs  . Weakness    HISTORY OF PRESENT ILLNESS:  Brandon RheinRobert S [name] is a 65 y.o. Caucasian male with medical history significant for chronic hyponatremia, COPD, coronary artery disease status post PCI and stent, hypertension, schizoaffective disorder bipolar type, dyslipidemia, schizophrenia, seizure disorder and anemia, who presented to the ER with acute onset of generalized weakness and fatigue worsening over the last 3 to 4 days.  He had labs at his facility today and it showed hyponatremia.  The patient was recently admitted here on 8/12 with hyponatremia of 121.  He was discharged on 8/15 with a sodium of 128 after inpatient management.  He denies any nausea or vomiting but has been having loose bowel movements.  No headache or dizziness or blurred vision.  No chest pain or dyspnea or palpitations.  He admits to mild cough and dyspnea without wheezing.  No dysuria, oliguria or hematuria, urgency or frequency or flank pain.  He reports that he drinks about 2.5 quarts of water that used to be a gallon in the past, 1 cup of coffee, 2 cups of tea with meals and half a cup of orange juice with breakfast.  He tells me that they wanted to recently increase his clozapine to 200 mg daily at his SNF but he feels more than 100 mg/day is significantly sedative and almost paralyzes him.  ED Course: When he came to the ER blood pressHe reports that he drinks about 2 bottles of Gatorade and about 1 gallon of water daily.  Denies alcohol useure was 122/92 with a respiratory rate of 22 and otherwise normal vital signs.  Labs revealed a sodium of 119 and  potassium of 3.4 and CBC showed anemia with hemoglobin of 12 and hematocrit 34.3 comparable to previous levels.  Influenza antigens and COVID-19 PCR came back negative. EKG as reviewed by me : Showed normal sinus rhythm with a rate of 85 with LVH with repolarization abnormality.  The patient will be admitted to a medical bed for further evaluation and management.  PAST MEDICAL HISTORY:   Past Medical History:  Diagnosis Date  . Anemia    IDA  . Colon polyps   . COPD (chronic obstructive pulmonary disease) (HCC)   . Coronary artery disease   . Heart attack (HCC) 2005  . Hiatal hernia   . History of ETOH abuse   . Hyperlipidemia   . Hypertension   . Pleurisy   . Schizophrenia (HCC)   . Schizophrenia (HCC)   . Seizures (HCC)    grand mal in 2000 or 2001 recovery from alcoholism  . Suicide attempt [facility](HCC)     PAST SURGICAL HISTORY:   Past Surgical History:  Procedure Laterality Date  . ABDOMINAL SURGERY     internal bleeding  . CHOLECYSTECTOMY N/A 05/04/2018   Procedure: LAPAROSCOPIC CHOLECYSTECTOMY, converted to open;  Surgeon: Sung AmabileSakai, Isami, DO;  Location: [facility] ORS;  Service: General;  Laterality: N/A;  . COLONOSCOPY    . COLONOSCOPY WITH PROPOFOL N/A 05/09/2016   Procedure: COLONOSCOPY WITH PROPOFOL;  Surgeon: Scot Junobert T Elliott, MD;  [facility] END[facility]COPY;  Service: Endoscopy;  Laterality: N/A;  . CORONARY ANGIOPLASTY WITH STENT PLACEMENT     1 vessel  . ESOPHAGOGASTRODUODENOSCOPY (EGD) WITH PROPOFOL N/A 05/09/2016   Procedure: ESOPHAGOGASTRODUODENOSCOPY (EGD) WITH PROPOFOL;  Surgeon: Scot Jun, MD;  Location: [facility] ENDOSCOPY;  Service: Endoscopy;  Laterality: N/A;  . EYE SURGERY Right   . GLAUCOMA SURGERY    . LAPAROSCOPIC APPENDECTOMY N/A 07/17/2018   Procedure: APPENDECTOMY LAPAROSCOPIC;  Surgeon: Sung Amabile, DO;  Location: [facility] ORS;  Service: General;  Laterality: N/A;  . LEFT HEART CATH Right 10/19/2017   Procedure: Left Heart Cath and Coronary Angiography;   Surgeon: Laurier Nancy, MD;  Location: [facility] INVASIVE CV LAB;  Service: Cardiovascular;  Laterality: Right;  . NOSE SURGERY    . 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 VASECTOMY      SOCIAL HISTORY:   Social History   Tobacco Use  . Smoking status: Former    Packs/day: 1.00    Types: Cigarettes    Quit date: 2012    Years since quitting: 10.6  . Smokeless tobacco: Former  Substance Use Topics  . Alcohol use: No    Comment: no alcohol since 2010    FAMILY HISTORY:   Family History  Problem Relation Age of Onset  . Lung cancer Mother   . Hypertension Father   . Heart attack Father   . CAD Father   . Prostate cancer Neg Hx   . Bladder Cancer Neg Hx   . Kidney cancer Neg Hx     DRUG ALLERGIES:   Allergies  Allergen Reactions  . Compazine [Prochlorperazine] Other (See Comments)    Dystonic rxn - convulsions. Near fatal reaction.   Ardine Bjork [Iodinated Diagnostic Agents] Shortness Of Breath    Pt states SOB after last IV contrast injection  . Alcohol-Sulfur [Elemental Sulfur] Other (See Comments)    History of alcoholism  . Benadryl [Diphenhydramine] Other (See Comments)    "stuffy", nasal congestion  . Depakote [Divalproex Sodium] Other (See Comments)    Cause elevated ammonia  . Dramamine [Dimenhydrinate] Swelling  . Plavix [Clopidogrel] Other (See Comments)    Rectal bleeding. "Perforated my intestines."  . Rosuvastatin     Other reaction(s): Other (See Comments)    REVIEW OF SYSTEMS:   ROS As per history of present illness. All pertinent systems were reviewed above. Constitutional, HEENT, cardiovascular, respiratory, GI, GU, musculoskeletal, neuro, psychiatric, endocrine, integumentary and hematologic systems were reviewed and are otherwise negative/unremarkable except for positive findings mentioned above in the HPI.   MEDICATIONS AT HOME:   Prior to Admission medications   Medication Sig Start Date End Date Taking? Authorizing Provider  acetaminophen  (TYLENOL) 325 MG tablet Take 650 mg by mouth every 4 (four) hours as needed for mild pain or moderate pain.    [provider]  amLODipine (NORVASC) 5 MG tablet Take 1 tablet (5 mg total) by mouth daily. 03/26/21   Enedina Finner, MD  buPROPion [facility] SR) 100 MG 12 hr tablet Take 100 mg by mouth daily.     [provider]  carvedilol (COREG) 25 MG tablet Take 25 mg by mouth 2 (two) times daily with a meal.    [provider]  cholecalciferol (VITAMIN D) 25 MCG tablet Take 1 tablet (1,000 Units total) by mouth daily. 03/26/21   Enedina Finner, MD  cloZAPine (CLOZARIL) 100 MG tablet Take 200 mg by mouth at bedtime.     [provider]  dorzolamide-timolol (COSOPT) 22.3-6.8 MG/ML  ophthalmic solution Place 1 drop into both eyes 2 (two) times daily.    [provider]  ezetimibe (ZETIA) 10 MG tablet Take 10 mg by mouth at bedtime.     [provider]  hydrOXYzine (VISTARIL) 25 MG capsule Take 25 mg by mouth 2 (two) times daily as needed for anxiety.    [provider]  isosorbide mononitrate (IMDUR) 30 MG 24 hr tablet Take 30 mg by mouth 2 (two) times daily.     [provider]  latanoprost (XALATAN) 0.005 % ophthalmic solution Place 1 drop into both eyes at bedtime. 09/15/17   [provider]  nitroGLYCERIN (NITROSTAT) 0.4 MG SL tablet Place 0.4 mg under the tongue every 5 (five) minutes as needed for chest pain.     [provider]  olmesartan (BENICAR) 20 MG tablet Take 20 mg by mouth daily.    [provider]  pantoprazole (PROTONIX) 40 MG tablet Take 40 mg by mouth 2 (two) times daily.    [provider]  senna (SENOKOT) 8.6 MG tablet Take 1-2 tablets by mouth daily as needed for constipation.    [provider]  simvastatin (ZOCOR) 40 MG tablet Take 40 mg by mouth at bedtime.     [provider]  sodium chloride 1 g tablet Take 1 tablet (1 g total) by mouth 3 (three) times daily  with meals. 03/25/21   Enedina Finner, MD  SYMBICORT 80-4.5 MCG/ACT inhaler Inhale 2 puffs into the lungs 2 (two) times daily. 09/15/17   [provider]  tamsulosin (FLOMAX) 0.4 MG CAPS capsule Take 0.4 mg by mouth daily.     [provider]      VITAL SIGNS:  Blood pressure (!) 141/86, pulse 81, temperature 97.7 F (36.5 C), temperature source Oral, resp. rate (!) 26, height 6' (1.829 m), weight 82.6 kg, SpO2 96 %.  PHYSICAL EXAMINATION:  Physical Exam  GENERAL:  65 y.o.-year-old Caucasian male patient lying in the bed with no acute distress.  EYES: Pupils equal, round, reactive to light and accommodation. No scleral icterus. Extraocular muscles intact.  HEENT: Head atraumatic, normocephalic. Oropharynx and nasopharynx clear.  NECK:  Supple, no jugular venous distention. No thyroid enlargement, no tenderness.  LUNGS: Normal breath sounds bilaterally, no wheezing, rales,rhonchi or crepitation. No use of accessory muscles of respiration.  CARDIOVASCULAR: Regular rate and rhythm, S1, S2 normal. No murmurs, rubs, or gallops.  ABDOMEN: Soft, nondistended, nontender. Bowel sounds present. No organomegaly or mass.  EXTREMITIES: No pedal edema, cyanosis, or clubbing.  NEUROLOGIC: Cranial nerves II through XII are intact. Muscle strength 5/5 in all extremities. Sensation intact. Gait not checked.  PSYCHIATRIC: The patient is alert and oriented x 3.  Normal affect and good eye contact. SKIN: No obvious rash, lesion, or ulcer.   LABORATORY PANEL:   CBC Recent Labs  Lab 04/03/21 1850  WBC 7.5  HGB 12.0*  HCT 34.3*  PLT 263   ------------------------------------------------------------------------------------------------------------------  Chemistries  Recent Labs  Lab 04/03/21 1850  NA 119*  K 3.4*  CL 82*  CO2 30  GLUCOSE 113*  BUN 9  CREATININE 0.49*  CALCIUM 8.4*    ------------------------------------------------------------------------------------------------------------------  Cardiac Enzymes No results for input(s): TROPONINI in the last 168 hours. ------------------------------------------------------------------------------------------------------------------  RADIOLOGY:  No results found.    IMPRESSION AND PLAN:  Active Problems:   Hyponatremia  1.  Symptomatic acute on chronic hyponatremia with generalized weakness.  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  It could be partly  related to recent mild diarrhea and possibly psychotropic medications.  The patient has associated hypokalemia that could be related to diarrhea. - The patient will be admitted to a medical bed. - Hyponatremia work-up including urine electrolytes and osmolality as well as serum osmolality and TSH will be obtained - We will restrict fluids to 1000 mL daily. - She will be hydrated with IV normal saline. - We will follow serial sodium levels. - Potassium was replaced and magnesium level will be checked.  2.  Essential hypertension. - We will continue his Norvasc, Coreg and ARB therapy.  3.  Dyslipidemia. - We will continue statin therapy.  4.  Glaucoma. - We will continue his ophthalmic gtt.  5.  Coronary artery disease. - We will continue ARB, beta-blocker therapy and as needed sublingual nitroglycerin as well as statin therapy.  6.  BPH. - We will continue Flomax.  7.  Schizoaffective disorder, bipolar type and history of schizophrenia. - We will continue his psychotropic medications for now with 100 mg of p.o. clozapine and obtain a psychiatry follow-up consultation.  DVT prophylaxis: Lovenox. Code Status: full code. Family Communication:  The plan of care was discussed in details with the patient (and family). I answered all questions. The patient agreed to proceed with the above mentioned plan. Further management will depend upon  hospital course. Disposition Plan: Back to previous home environment Consults called: Psychiatry.   All the records are reviewed and case discussed with ED provider.  Status is: Inpatient  Remains inpatient appropriate because:Ongoing diagnostic testing needed not appropriate for outpatient work up, Unsafe d/c plan, IV treatments appropriate due to intensity of illness or inability to take PO, and Inpatient level of care appropriate due to severity of illness  Dispo: The patient is from: SNF              Anticipated d/c is to: SNF              Patient currently is not medically stable to d/c.   Difficult to place patient No  TOTAL TIME TAKING CARE OF THIS PATIENT: 55 minutes.    Hannah Beat M.D on 04/03/2021 at 8:43 PM  [facility]   From 7 PM-7 AM, contact night-coverage [email or web address]  CC: Primary care physician; Sherron Monday, MD

## 2021-04-03 NOTE — ED Triage Notes (Signed)
Pt brought in by [facility] with c/o abnormal lab work from [facility]. Staff told [facility] that he had low Na+, pt does have lab results with him. Pt states that he feels weak.

## 2021-04-03 NOTE — ED Notes (Signed)
Hospitalist at the bedside 

## 2021-04-03 NOTE — ED Notes (Signed)
Critical Serum Osmalality--242--MD notified and aware

## 2021-04-04 ENCOUNTER — Encounter: Payer: Self-pay | Admitting: Family Medicine

## 2021-04-04 LAB — BASIC METABOLIC PANEL
Anion gap: 8 (ref 5–15)
BUN: 5 mg/dL — ABNORMAL LOW (ref 8–23)
CO2: 30 mmol/L (ref 22–32)
Calcium: 8.3 mg/dL — ABNORMAL LOW (ref 8.9–10.3)
Chloride: 81 mmol/L — ABNORMAL LOW (ref 98–111)
Creatinine, Ser: 0.43 mg/dL — ABNORMAL LOW (ref 0.61–1.24)
GFR, Estimated: >60 mL/min (ref >60)
Glucose, Bld: 98 mg/dL (ref 70–99)
Potassium: 3.7 mmol/L (ref 3.5–5.1)
Sodium: 119 mmol/L — CL (ref 135–145)

## 2021-04-04 LAB — NA AND K (SODIUM & POTASSIUM), RAND UR
Potassium Urine: 18 mmol/L
Sodium, Ur: 63 mmol/L

## 2021-04-04 LAB — SODIUM, URINE, RANDOM: Sodium, Ur: 63 mmol/L

## 2021-04-04 LAB — MRSA NEXT GEN BY PCR, NASAL: MRSA by PCR Next Gen: NOT DETECTED

## 2021-04-04 LAB — CBC
HCT: 36.3 % — ABNORMAL LOW (ref 39.0–52.0)
Hemoglobin: 12.2 g/dL — ABNORMAL LOW (ref 13.0–17.0)
MCH: 27.8 pg (ref 26.0–34.0)
MCHC: 33.6 g/dL (ref 30.0–36.0)
MCV: 82.7 fL (ref 80.0–100.0)
Platelets: 265 10*3/uL (ref 150–400)
RBC: 4.39 MIL/uL (ref 4.22–5.81)
RDW: 12.9 % (ref 11.5–15.5)
WBC: 6.2 10*3/uL (ref 4.0–10.5)
nRBC: 0 % (ref 0.0–0.2)

## 2021-04-04 MED ORDER — TEMAZEPAM 15 MG PO CAPS
15.0000 mg | ORAL_CAPSULE | Freq: Every day | ORAL | Status: DC
  Administered 2021-04-04: 15 mg via ORAL
  Filled 2021-04-04: qty 1

## 2021-04-04 NOTE — Progress Notes (Signed)
PROGRESS NOTE    Brandon [name]  FKC:[id number] DOB: 1955/08/23 DOA: 04/03/2021 PCP: Sherron Monday, MD   Brief Narrative:  Brandon [name] is a 65 y.o. Caucasian male with medical history significant for chronic hyponatremia, COPD, coronary artery disease status post PCI and stent, hypertension, schizoaffective disorder bipolar type, dyslipidemia, schizophrenia, seizure disorder and anemia, who presented to the ER with acute onset of generalized weakness and fatigue worsening over the last 3 to 4 days - repeat labs show worsening hyponatremia from baseline. He reports that he drinks about 2.5 quarts of water that used to be a gallon in the past, 1 cup of coffee, 2 cups of tea with meals and half a cup of orange juice with breakfast.  He tells me that they wanted to recently increase his clozapine to 200 mg daily at his SNF but he feels more than 100 mg/day is significantly sedative and almost paralyzes him.  Psychiatry consulted for further insight and recommendations on medications.  Assessment & Plan:   Acute symptomatic on chronic hyponatremia with generalized weakness.   - In the setting of psychogenic polydipsia -Continue supportive care, IV fluids, increase p.o. intake as tolerated -Continue fluid restriction  Essential hypertension. - Continue Norvasc, Coreg and ARB therapy.  Dyslipidemia. -Continue statin  Glaucoma. -Continue Cosopt, Xalatan eyedrops  Coronary artery disease. - We will continue ARB, beta-blocker therapy and as needed sublingual nitroglycerin as well as statin therapy.  BPH. - We will continue Flomax.  Schizoaffective disorder, bipolar type and history of schizophrenia. - We will continue his psychotropic medications for now with 100 mg of p.o. clozapine and obtain a psychiatry follow-up consultation.  DVT prophylaxis: Lovenox Code Status: Full Family Communication: None present  Status is: Inpatient  Dispo: The patient is from: Home               Anticipated d/c is to: Home              Anticipated d/c date is: 48 to 72 hours pending labs              Patient currently not medically stable for discharge  Consultants:  Psychiatry  Procedures:  None  Antimicrobials:  None  Subjective: No acute issues or events overnight denies nausea vomiting diarrhea constipation headache fevers chills or chest pain  Objective: Vitals:   04/03/21 2115 04/03/21 2200 04/03/21 2240 04/04/21 0525  BP: 131/77 123/90 122/88 (!) (P) 143/91  Pulse: 83 82 90 (P) 86  Resp: (!) 23 (!) 22 16 (P) 16  Temp: 97.7 F (36.5 C)  98 F (36.7 C) (P) 98 F (36.7 C)  TempSrc: Oral     SpO2: 98% 94% 97% (P) 100%  Weight:      Height:        Intake/Output Summary (Last 24 hours) at 04/04/2021 0738 Last data filed at 04/04/2021 0526 Gross per 24 hour  Intake --  Output 1225 ml  Net -1225 ml   Filed Weights   04/03/21 1836  Weight: 82.6 kg    Examination:  General:  Pleasantly resting in bed, No acute distress. HEENT:  Normocephalic atraumatic.  Sclerae nonicteric, noninjected.  Extraocular movements intact bilaterally. Neck:  Without mass or deformity.  Trachea is midline. Lungs:  Clear to auscultate bilaterally without rhonchi, wheeze, or rales. Heart:  Regular rate and rhythm.  Without murmurs, rubs, or gallops. Abdomen:  Soft, nontender, nondistended.  Without guarding or rebound. Extremities: Without cyanosis, clubbing, edema, or obvious deformity. Vascular:  Dorsalis pedis and posterior tibial pulses palpable bilaterally. Skin:  Warm and dry, no erythema, no ulcerations.  Data Reviewed: I have personally reviewed following labs and imaging studies  CBC: Recent Labs  Lab 04/03/21 1850 04/04/21 0405  WBC 7.4  7.5 6.2  NEUTROABS 5.5  --   HGB 11.9*  12.0* 12.2*  HCT 34.6*  34.3* 36.3*  MCV 83.2  83.1 82.7  PLT 278  263 265   Basic Metabolic Panel: Recent Labs  Lab 04/03/21 1850 04/04/21 0405  NA 119* 119*  K 3.4* 3.7   CL 82* 81*  CO2 30 30  GLUCOSE 113* 98  BUN 9 5*  CREATININE 0.49* 0.43*  CALCIUM 8.4* 8.3*  MG 1.9  --    GFR: Estimated Creatinine Clearance: 101 mL/min (A) (by C-G formula based on SCr of 0.43 mg/dL (L)). Liver Function Tests: No results for input(s): AST, ALT, ALKPHOS, BILITOT, PROT, ALBUMIN in the last 168 hours. No results for input(s): LIPASE, AMYLASE in the last 168 hours. No results for input(s): AMMONIA in the last 168 hours. Coagulation Profile: No results for input(s): INR, PROTIME in the last 168 hours. Cardiac Enzymes: No results for input(s): CKTOTAL, CKMB, CKMBINDEX, TROPONINI in the last 168 hours. BNP (last 3 results) No results for input(s): PROBNP in the last 8760 hours. HbA1C: No results for input(s): HGBA1C in the last 72 hours. CBG: Recent Labs  Lab 04/03/21 1946  GLUCAP 110*   Lipid Profile: No results for input(s): CHOL, HDL, LDLCALC, TRIG, CHOLHDL, LDLDIRECT in the last 72 hours. Thyroid Function Tests: No results for input(s): TSH, T4TOTAL, FREET4, T3FREE, THYROIDAB in the last 72 hours. Anemia Panel: No results for input(s): VITAMINB12, FOLATE, FERRITIN, TIBC, IRON, RETICCTPCT in the last 72 hours. Sepsis Labs: No results for input(s): PROCALCITON, LATICACIDVEN in the last 168 hours.  Recent Results (from the past 240 hour(s))  Resp Panel by RT-PCR (Flu A&B, Covid) Nasopharyngeal Swab     Status: None   Collection Time: 03/25/21  2:08 PM   Specimen: Nasopharyngeal Swab; Nasopharyngeal(NP) swabs in vial transport medium  Result Value Ref Range Status   SARS Coronavirus 2 by RT PCR NEGATIVE NEGATIVE Final    Comment: (NOTE) SARS-CoV-2 target nucleic acids are NOT DETECTED.  The SARS-CoV-2 RNA is generally detectable in upper respiratory specimens during the acute phase of infection. The lowest concentration of SARS-CoV-2 viral copies this assay can detect is 138 copies/mL. A negative result does not preclude SARS-Cov-2 infection and should  not be used as the sole basis for treatment or other patient management decisions. A negative result may occur with  improper specimen collection/handling, submission of specimen other than nasopharyngeal swab, presence of viral mutation(s) within the areas targeted by this assay, and inadequate number of viral copies(<138 copies/mL). A negative result must be combined with clinical observations, patient history, and epidemiological information. The expected result is Negative.  Fact Sheet for Patients:  BloggerCourse.com  Fact Sheet for Healthcare Providers:  SeriousBroker.it  This test is no t yet approved or cleared by the [place] FDA and  has been authorized for detection and/or diagnosis of SARS-CoV-2 by FDA under an Emergency Use Authorization (EUA). This EUA will remain  in effect (meaning this test can be used) for the duration of the COVID-19 declaration under Section 564(b)(1) of the Act, 21 U.S.C.section 360bbb-3(b)(1), unless the authorization is terminated  or revoked sooner.       Influenza A by PCR NEGATIVE NEGATIVE Final   Influenza B  by PCR NEGATIVE NEGATIVE Final    Comment: (NOTE) The Xpert Xpress SARS-CoV-2/FLU/RSV plus assay is intended as an aid in the diagnosis of influenza from Nasopharyngeal swab specimens and should not be used as a sole basis for treatment. Nasal washings and aspirates are unacceptable for Xpert Xpress SARS-CoV-2/FLU/RSV testing.  Fact Sheet for Patients: BloggerCourse.com  Fact Sheet for Healthcare Providers: SeriousBroker.it  This test is not yet approved or cleared by the [place] FDA and has been authorized for detection and/or diagnosis of SARS-CoV-2 by FDA under an Emergency Use Authorization (EUA). This EUA will remain in effect (meaning this test can be used) for the duration of the COVID-19 declaration under  Section 564(b)(1) of the Act, 21 U.S.C. section 360bbb-3(b)(1), unless the authorization is terminated or revoked.  Performed at [facility], [address], [place], [place] [address]      Radiology Studies: No results found.  Scheduled Meds:  amLODipine  5 mg Oral Daily   atorvastatin  20 mg Oral QHS   buPROPion ER  100 mg Oral Daily   carvedilol  25 mg Oral BID WC   cholecalciferol  1,000 Units Oral Daily   cloZAPine  100 mg Oral QHS   dorzolamide-timolol  1 drop Both Eyes BID   enoxaparin (LOVENOX) injection  40 mg Subcutaneous Q24H   ezetimibe  10 mg Oral QHS   irbesartan  37.5 mg Oral Daily   isosorbide mononitrate  30 mg Oral BID   latanoprost  1 drop Both Eyes QHS   mometasone-formoterol  2 puff Inhalation BID   pantoprazole  40 mg Oral BID   sodium chloride  1 g Oral TID WC   tamsulosin  0.4 mg Oral Daily   Continuous Infusions:  sodium chloride 100 mL/hr at 04/03/21 2146     LOS: 1 day   Time spent:  Azucena Fallen, DO [facility]  If 7PM-7AM, please contact night-coverage [email or web address]  04/04/2021, 7:38 AM

## 2021-04-04 NOTE — Consult Note (Signed)
[facility] Face-to-Face Psychiatry Consult   Reason for Consult: Consult for this 65 year old man with a history of schizoaffective disorder who was in the hospital for hyponatremia Referring Physician: [name] Patient Identification: Brandon [name] MRN:  [id number] Principal Diagnosis: Schizoaffective disorder, bipolar type (HCC) Diagnosis:  Principal Problem:   Schizoaffective disorder, bipolar type (HCC) Active Problems:   Hyponatremia   Total Time spent with patient: 1 hour  Subjective:   Brandon [name] is a 65 y.o. male patient admitted with "my sodium was low".  HPI: Patient seen chart reviewed.  65 year old man who presented to the hospital with symptoms of confusion weakness disorientation nausea turned out he had a sodium of 119.  Labs are improving and he is feeling physically a little better now.  Patient tells me that he had been told in the past that water drinking could be a cause of his low sodium and that he tries to limit his water consumption during the day.  He does not feel that he had been drinking very excessive amounts of liquid at home.  In terms of mental health symptoms he tells me that recently he has been feeling a little more paranoid.  He has trouble describing that sounds like it is mostly a vague sense of anxiety.  He is more specific however in describing some things that he attributes to being side effects of clozapine.  He says he is having trouble sleeping at night and that after he takes his medicine he cannot move for some period of time.  He talks about sometimes seeing shadows moving around.  He denies any suicidal or homicidal ideation.  Denies auditory hallucinations.  He also says that his vision gets blurry at times.  He is currently only taking 100 mg of clozapine which is less than he took in the past and really not taking much else in the way of psychiatric medicine.  His outpatient providers are [facility] with whom he has an ACT team and appears  support.  His medicines are prescribed by Dr. Norman Clay.  Past Psychiatric History: There is very little in the way of past psychiatric information in the old chart.  Apparently he has been very stable for a long time so that any past psychiatric notes have not shown up in the inpatient record.  He has been on clozapine for several years probably since around 2015.  He cannot remember what it was that caused the change to Clozapine.  The only medicine he could spontaneously remember having taken before that was Depakote but when I started naming medicines he remembered Geodon and Seroquel Zyprexa and Risperdal all of which she said he did not like.  He has had a prior psychiatric admission about 12 years ago.  He used to have a problem with alcohol and drugs but also has been sober for about a 10 to 12 years.  Looking back on his lab values it looks like the problems with his sodium started around the end of 2019 which was when he had his gallbladder and his appendix both taken out back to back.  I have no idea why that could possibly be related however.  There did not seem to be a particular change to his psychiatric medicine at that time.  Risk to Self:   Risk to Others:   Prior Inpatient Therapy:   Prior Outpatient Therapy:    Past Medical History:  Past Medical History:  Diagnosis Date   Anemia    IDA  Colon polyps    COPD (chronic obstructive pulmonary disease) (HCC)    Coronary artery disease    Heart attack (HCC) 2005   Hiatal hernia    History of ETOH abuse    Hyperlipidemia    Hypertension    Pleurisy    Schizophrenia (HCC)    Schizophrenia (HCC)    Seizures (HCC)    grand mal in 2000 or 2001 recovery from alcoholism   Suicide attempt [facility])     Past Surgical History:  Procedure Laterality Date   ABDOMINAL SURGERY     internal bleeding   CHOLECYSTECTOMY N/A 05/04/2018   Procedure: LAPAROSCOPIC CHOLECYSTECTOMY, converted to open;  Surgeon: Sung Amabile, DO;  Location: [facility] ORS;   Service: General;  Laterality: N/A;   COLONOSCOPY     COLONOSCOPY WITH PROPOFOL N/A 05/09/2016   Procedure: COLONOSCOPY WITH PROPOFOL;  Surgeon: Scot Jun, MD;  Location: [facility] ENDOSCOPY;  Service: Endoscopy;  Laterality: N/A;   CORONARY ANGIOPLASTY WITH STENT PLACEMENT     1 vessel   ESOPHAGOGASTRODUODENOSCOPY (EGD) WITH PROPOFOL N/A 05/09/2016   Procedure: ESOPHAGOGASTRODUODENOSCOPY (EGD) WITH PROPOFOL;  Surgeon: Scot Jun, MD;  Location: [facility] ENDOSCOPY;  Service: Endoscopy;  Laterality: N/A;   EYE SURGERY Right    GLAUCOMA SURGERY     LAPAROSCOPIC APPENDECTOMY N/A 07/17/2018   Procedure: APPENDECTOMY LAPAROSCOPIC;  Surgeon: Sung Amabile, DO;  Location: [facility] ORS;  Service: General;  Laterality: N/A;   LEFT HEART CATH Right 10/19/2017   Procedure: Left Heart Cath and Coronary Angiography;  Surgeon: Laurier Nancy, MD;  Location: [facility] INVASIVE CV LAB;  Service: Cardiovascular;  Laterality: Right;   NOSE SURGERY     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   VASECTOMY     Family History:  Family History  Problem Relation Age of Onset   Lung cancer Mother    Hypertension Father    Heart attack Father    CAD Father    Prostate cancer Neg Hx    Bladder Cancer Neg Hx    Kidney cancer Neg Hx    Family Psychiatric  History: None reported Social History:  Social History   Substance and Sexual Activity  Alcohol Use No   Comment: no alcohol since 2010     Social History   Substance and Sexual Activity  Drug Use No    Social History   Socioeconomic History   Marital status: Married    Spouse name: Not on file   Number of children: Not on file   Years of education: Not on file   Highest education level: Not on file  Occupational History   Not on file  Tobacco Use   Smoking status: Former    Packs/day: 1.00    Types: Cigarettes    Quit date: 2012    Years since quitting: 10.6   Smokeless tobacco: Former  Building services engineer Use: Never used  Substance and Sexual  Activity   Alcohol use: No    Comment: no alcohol since 2010   Drug use: No   Sexual activity: Not on file  Other Topics Concern   Not on file  Social History Narrative   ** Merged History Encounter **       Social Determinants of Health   Financial Resource Strain: Not on file  Food Insecurity: Not on file  Transportation Needs: Not on file  Physical Activity: Not on file  Stress: Not on file  Social Connections: Not on file  Additional Social History:    Allergies:   Allergies  Allergen Reactions   Compazine [Prochlorperazine] Other (See Comments)    Dystonic rxn - convulsions. Near fatal reaction.    Ivp Dye [Iodinated Diagnostic Agents] Shortness Of Breath    Pt states SOB after last IV contrast injection   Alcohol-Sulfur [Elemental Sulfur] Other (See Comments)    History of alcoholism   Benadryl [Diphenhydramine] Other (See Comments)    "stuffy", nasal congestion   Depakote [Divalproex Sodium] Other (See Comments)    Cause elevated ammonia   Dramamine [Dimenhydrinate] Swelling   Plavix [Clopidogrel] Other (See Comments)    Rectal bleeding. "Perforated my intestines."   Rosuvastatin     Other reaction(s): Other (See Comments)    Labs:  Results for orders placed or performed during the hospital encounter of 04/03/21 (from the past 48 hour(s))  Basic metabolic panel     Status: Abnormal   Collection Time: 04/03/21  6:50 PM  Result Value Ref Range   Sodium 119 (LL) 135 - 145 mmol/L    Comment: CRITICAL RESULT CALLED TO, READ BACK BY AND VERIFIED WITH LISA THOMPSON  ON 04/03/21 SKL    Potassium 3.4 (L) 3.5 - 5.1 mmol/L   Chloride 82 (L) 98 - 111 mmol/L   CO2 30 22 - 32 mmol/L   Glucose, Bld 113 (H) 70 - 99 mg/dL    Comment: Glucose reference range applies only to samples taken after fasting for at least 8 hours.   BUN 9 8 - 23 mg/dL   Creatinine, Ser 5.62 (L) 0.61 - 1.24 mg/dL   Calcium 8.4 (L) 8.9 - 10.3 mg/dL   GFR, Estimated >13 >08 mL/min     Comment: (NOTE) Calculated using the CKD-EPI Creatinine Equation (2021)    Anion gap 7 5 - 15    Comment: Performed at [facility], [address] [place], [place] [address]  CBC     Status: Abnormal   Collection Time: 04/03/21  6:50 PM  Result Value Ref Range   WBC 7.5 4.0 - 10.5 K/uL   RBC 4.13 (L) 4.22 - 5.81 MIL/uL   Hemoglobin 12.0 (L) 13.0 - 17.0 g/dL   HCT 69.6 (L) 29.5 - 28.4 %   MCV 83.1 80.0 - 100.0 fL   MCH 29.1 26.0 - 34.0 pg   MCHC 35.0 30.0 - 36.0 g/dL   RDW 13.2 44.0 - 10.2 %   Platelets 263 150 - 400 K/uL   nRBC 0.0 0.0 - 0.2 %    Comment: Performed at [facility], [address] [place], [place] [address]  Osmolality     Status: Abnormal   Collection Time: 04/03/21  6:50 PM  Result Value Ref Range   Osmolality 242 (LL) 275 - 295 mOsm/kg    Comment: CRITICAL RESULT CALLED TO, READ BACK BY AND VERIFIED WITH: BETH SHELL   ON 04/03/21 SKL REPEATED TO VERIFY Performed at [facility], [address] [place], [place] [address]   CBC with Differential/Platelet     Status: Abnormal   Collection Time: 04/03/21  6:50 PM  Result Value Ref Range   WBC 7.4 4.0 - 10.5 K/uL   RBC 4.16 (L) 4.22 - 5.81 MIL/uL   Hemoglobin 11.9 (L) 13.0 - 17.0 g/dL   HCT 47.4 (L) 25.9 - 56.3 %   MCV 83.2 80.0 - 100.0 fL   MCH 28.6 26.0 - 34.0 pg   MCHC 34.4 30.0 - 36.0 g/dL   RDW 12.9  11.5 - 15.5 %   Platelets 278 150 - 400 K/uL   nRBC 0.0 0.0 - 0.2 %   Neutrophils Relative % 73 %   Neutro Abs 5.5 1.7 - 7.7 K/uL   Lymphocytes Relative 14 %   Lymphs Abs 1.0 0.7 - 4.0 K/uL   Monocytes Relative 10 %   Monocytes Absolute 0.7 0.1 - 1.0 K/uL   Eosinophils Relative 1 %   Eosinophils Absolute 0.1 0.0 - 0.5 K/uL   Basophils Relative 1 %   Basophils Absolute 0.0 0.0 - 0.1 K/uL   Immature Granulocytes 1 %   Abs Immature Granulocytes 0.04 0.00 - 0.07 K/uL    Comment: Performed at [facility], [address], [place], [place] [address]   Magnesium     Status: None   Collection Time: 04/03/21  6:50 PM  Result Value Ref Range   Magnesium 1.9 1.7 - 2.4 mg/dL    Comment: Performed at [facility], [address] [place], [place] [address]  CBG monitoring, ED     Status: Abnormal   Collection Time: 04/03/21  7:46 PM  Result Value Ref Range   Glucose-Capillary 110 (H) 70 - 99 mg/dL    Comment: Glucose reference range applies only to samples taken after fasting for at least 8 hours.  Urinalysis, Complete w Microscopic     Status: Abnormal   Collection Time: 04/03/21  8:50 PM  Result Value Ref Range   Color, Urine YELLOW (A) YELLOW   APPearance CLOUDY (A) CLEAR   Specific Gravity, Urine 1.008 1.005 - 1.030   pH 7.0 5.0 - 8.0   Glucose, UA NEGATIVE NEGATIVE mg/dL   Hgb urine dipstick NEGATIVE NEGATIVE   Bilirubin Urine NEGATIVE NEGATIVE   Ketones, ur NEGATIVE NEGATIVE mg/dL   Protein, ur NEGATIVE NEGATIVE mg/dL   Nitrite NEGATIVE NEGATIVE   Leukocytes,Ua NEGATIVE NEGATIVE   RBC / HPF 0-5 0 - 5 RBC/hpf   WBC, UA 0-5 0 - 5 WBC/hpf   Bacteria, UA RARE (A) NONE SEEN   Squamous Epithelial / LPF NONE SEEN 0 - 5   Mucus PRESENT    Budding Yeast PRESENT     Comment: Performed at [facility], [address] [place], [place] [address]  Osmolality, urine     Status: None   Collection Time: 04/03/21  8:50 PM  Result Value Ref Range   Osmolality, Ur 304 300 - 900 mOsm/kg    Comment: REPEATED TO VERIFY Performed at [facility], [address] [place], [place] [address]   Sodium, urine, random     Status: None   Collection Time: 04/03/21  8:50 PM  Result Value Ref Range   Sodium, Ur 63 mmol/L    Comment: Performed at [facility], [address] [place], [place] [address]  Na and K (sodium & potassium), rand urine     Status: None   Collection Time: 04/03/21  8:50 PM  Result Value Ref Range   Sodium, Ur 63 mmol/L   Potassium Urine 18 mmol/L    Comment: Performed at [facility], [address] [place], [place] [address]  Basic metabolic panel     Status: Abnormal   Collection Time: 04/04/21  4:05 AM  Result Value Ref Range   Sodium 119 (LL) 135 - 145 mmol/L    Comment: CRITICAL RESULT CALLED TO, READ BACK BY AND VERIFIED WITH MARIA BOSTON RN [phone] 04/04/21 HNM    Potassium 3.7 3.5 - 5.1  mmol/L   Chloride 81 (L) 98 - 111 mmol/L   CO2 30 22 - 32 mmol/L   Glucose, Bld 98 70 - 99 mg/dL    Comment: Glucose reference range applies only to samples taken after fasting for at least 8 hours.   BUN 5 (L) 8 - 23 mg/dL   Creatinine, Ser 7.62 (L) 0.61 - 1.24 mg/dL   Calcium 8.3 (L) 8.9 - 10.3 mg/dL   GFR, Estimated >83 >15 mL/min    Comment: (NOTE) Calculated using the CKD-EPI Creatinine Equation (2021)    Anion gap 8 5 - 15    Comment: Performed at [facility], [address] [place], [place] [address]  CBC     Status: Abnormal   Collection Time: 04/04/21  4:05 AM  Result Value Ref Range   WBC 6.2 4.0 - 10.5 K/uL   RBC 4.39 4.22 - 5.81 MIL/uL   Hemoglobin 12.2 (L) 13.0 - 17.0 g/dL   HCT 07.3 (L) 71.0 - 62.6 %   MCV 82.7 80.0 - 100.0 fL   MCH 27.8 26.0 - 34.0 pg   MCHC 33.6 30.0 - 36.0 g/dL   RDW 94.8 54.6 - 27.0 %   Platelets 265 150 - 400 K/uL   nRBC 0.0 0.0 - 0.2 %    Comment: Performed at [facility], [address] [place], [place] [address]  MRSA Next Gen by PCR, Nasal     Status: None   Collection Time: 04/04/21  6:20 AM   Specimen: Nasal Mucosa; Nasal Swab  Result Value Ref Range   MRSA by PCR Next Gen NOT DETECTED NOT DETECTED    Comment: (NOTE) The GeneXpert MRSA Assay (FDA approved for NASAL specimens only), is one component of a comprehensive MRSA colonization surveillance program. It is not intended to diagnose MRSA infection nor to guide or monitor treatment for MRSA infections. Test performance is not FDA approved in patients less than 60 years old. Performed at [facility], [address], [place], [place] [address]     Current Facility-Administered Medications  Medication Dose Route Frequency Provider Last Rate Last Admin   0.9 %  sodium chloride infusion   Intravenous Continuous Mansy, Jan A, MD 100 mL/hr at 04/03/21 2146 New Bag at 04/03/21 2146   acetaminophen (TYLENOL) tablet 650 mg  650 mg Oral Q6H PRN Mansy, Jan A, MD       Or   acetaminophen (TYLENOL) suppository 650 mg  650 mg Rectal Q6H PRN Mansy, Jan A, MD       amLODipine (NORVASC) tablet 5 mg  5 mg Oral Daily Mansy, Jan A, MD   5 mg at 04/04/21 0916   atorvastatin (LIPITOR) tablet 20 mg  20 mg Oral QHS Sharen Hones, RPH   20 mg at 04/03/21 2255   buPROPion ER [facility] SR) 12 hr tablet 100 mg  100 mg Oral Daily Mansy, Jan A, MD   100 mg at 04/04/21 0917   carvedilol (COREG) tablet 25 mg  25 mg Oral BID WC Mansy, Jan A, MD   25 mg at 04/04/21 9937   cholecalciferol (VITAMIN D3) tablet 1,000 Units  1,000 Units Oral Daily Mansy, Jan A, MD   1,000 Units at 04/04/21 0917   cloZAPine (CLOZARIL) tablet 100 mg  100 mg Oral QHS Mansy, Jan A, MD   100 mg at 04/03/21 2301   dorzolamide-timolol (COSOPT) 22.3-6.8 MG/ML ophthalmic solution 1 drop  1 drop Both Eyes BID Mansy, Jan A,  MD   1 drop at 04/04/21 0919   enoxaparin (LOVENOX) injection 40 mg  40 mg Subcutaneous Q24H Mansy, Jan A, MD   40 mg at 04/03/21 2256   ezetimibe (ZETIA) tablet 10 mg  10 mg Oral QHS Mansy, Jan A, MD   10 mg at 04/03/21 2256   hydrOXYzine (VISTARIL) capsule 25 mg  25 mg Oral BID PRN Mansy, Jan A, MD       irbesartan (AVAPRO) tablet 37.5 mg  37.5 mg Oral Daily Mansy, Jan A, MD   37.5 mg at 04/04/21 [id number]   isosorbide mononitrate (IMDUR) 24 hr tablet 30 mg  30 mg Oral BID Mansy, Jan A, MD   30 mg at 04/04/21 0916   latanoprost (XALATAN) 0.005 % ophthalmic solution 1 drop  1 drop Both Eyes QHS Mansy, Jan A, MD   1 drop at 04/03/21 2305   magnesium hydroxide (MILK OF MAGNESIA) suspension 30 mL  30 mL Oral Daily PRN Mansy, Jan A, MD        mometasone-formoterol [facility](DULERA) 100-5 MCG/ACT inhaler 2 puff  2 puff Inhalation BID Mansy, Jan A, MD   2 puff at 04/04/21 [id number]   nitroGLYCERIN (NITROSTAT) SL tablet 0.4 mg  0.4 mg Sublingual Q5 min PRN Mansy, Jan A, MD       ondansetron [facility](ZOFRAN) tablet 4 mg  4 mg Oral Q6H PRN Mansy, Jan A, MD       Or   ondansetron [facility](ZOFRAN) injection 4 mg  4 mg Intravenous Q6H PRN Mansy, Jan A, MD       pantoprazole (PROTONIX) EC tablet 40 mg  40 mg Oral BID Mansy, Jan A, MD   40 mg at 04/04/21 [id number]   senna (SENOKOT) tablet 8.6-17.2 mg  1-2 tablet Oral Daily PRN Mansy, Jan A, MD       sodium chloride tablet 1 g  1 g Oral TID WC Mansy, Jan A, MD   1 g at 04/04/21 1209   tamsulosin (FLOMAX) capsule 0.4 mg  0.4 mg Oral Daily Mansy, Jan A, MD   0.4 mg at 04/04/21 0916   temazepam (RESTORIL) capsule 15 mg  15 mg Oral QHS Cheree Fowles T, MD       traZODone (DESYREL) tablet 25 mg  25 mg Oral QHS PRN Mansy, Vernetta HoneyJan A, MD        Musculoskeletal: Strength & Muscle Tone: within normal limits Gait & Station: normal Patient leans: N/A            Psychiatric Specialty Exam:  Presentation  General Appearance:  No data recorded Eye Contact: No data recorded Speech: No data recorded Speech Volume: No data recorded Handedness: No data recorded  Mood and Affect  Mood: No data recorded Affect: No data recorded  Thought Process  Thought Processes: No data recorded Descriptions of Associations:No data recorded Orientation:No data recorded Thought Content:No data recorded History of Schizophrenia/Schizoaffective disorder:No data recorded Duration of Psychotic Symptoms:No data recorded Hallucinations:No data recorded Ideas of Reference:No data recorded Suicidal Thoughts:No data recorded Homicidal Thoughts:No data recorded  Sensorium  Memory: No data recorded Judgment: No data recorded Insight: No data recorded  Executive Functions  Concentration: No data recorded Attention Span: No data  recorded Recall: No data recorded Fund of Knowledge: No data recorded Language: No data recorded  Psychomotor Activity  Psychomotor Activity: No data recorded  Assets  Assets: No data recorded  Sleep  Sleep: No data recorded  Physical Exam: Physical Exam Vitals and nursing note reviewed.  Constitutional:      Appearance: Normal appearance.  HENT:     Head: Normocephalic and atraumatic.     Mouth/Throat:     Pharynx: Oropharynx is clear.  Eyes:     Pupils: Pupils are equal, round, and reactive to light.  Cardiovascular:     Rate and Rhythm: Normal rate and regular rhythm.  Pulmonary:     Effort: Pulmonary effort is normal.     Breath sounds: Normal breath sounds.  Abdominal:     General: Abdomen is flat.     Palpations: Abdomen is soft.  Musculoskeletal:        General: Normal range of motion.  Skin:    General: Skin is warm and dry.  Neurological:     General: No focal deficit present.     Mental Status: He is alert. Mental status is at baseline.  Psychiatric:        Attention and Perception: Attention normal.        Mood and Affect: Mood is anxious.        Speech: Speech is delayed.        Behavior: Behavior is slowed.        Thought Content: Thought content normal. Thought content does not include homicidal or suicidal ideation.        Cognition and Memory: Memory is impaired.   Review of Systems  Constitutional: Negative.   HENT: Negative.    Eyes: Negative.   Respiratory: Negative.    Cardiovascular: Negative.   Gastrointestinal: Negative.   Musculoskeletal: Negative.   Skin: Negative.   Neurological: Negative.   Psychiatric/Behavioral:  Negative for depression, hallucinations, substance abuse and suicidal ideas. The patient is nervous/anxious and has insomnia.   Blood pressure 99/70, pulse 74, temperature 98.7 F (37.1 C), resp. rate 18, height 6' (1.829 m), weight 82.6 kg, SpO2 94 %. Body mass index is 24.68 kg/m.  Treatment Plan  Summary: Medication management and Plan patient with 2 interlocking problems the hyponatremia and the schizophrenia.  His hyponatremia had mostly it looks like been attributed to water intoxication.  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  He had a very low sodium of 119 and at the same time his urine osmolality was normal and so was his urine sodium.  Not really in the ranges consistent with water intoxication at all.  The question would be why would he have SIADH.  Although there are a handful of case reports alleging to associate clozapine with SIADH it is usually not considered one of the main offenders in terms of causing this condition.  In fact there are even some papers claiming to use it to try to treat SIADH.  The other medicines that he is taking regularly are not typically things that cause it either.  This brings [place] to the schizophrenia.  He is already on Clozapine which is the most powerful antipsychotic medicine there is.  The things that he is calling "side effects" of clozapine are mostly not typical side effects.  The problem of not being able to sleep and not being able to move neither 1 sound like they would be related to clozapine.  The blurred vision could be.  I am actually wondering whether he may be feeling mentally worse because he is on a lower dose of Clozapine than he used to be.  However given his concern about side effects and are concerned about the sodium I would not jump to the conclusion  of increasing his dose.  I tried to review several other antipsychotics with him such as Seroquel that I might of suggested as a change from the Clozapine but he does not want to do it because all of the medicines I named were things he thought he had side effects to.  In other words this is a complicated situation making a decision on his medicine.  For now I have only added a modest dose of Restoril as a sleeping pill.  I think I might end up just suggesting  he leave his medicines be and that changes in management of the psychiatric medicine be deferred to his primary outpatient caregiver.  Nevertheless I will try to follow up tomorrow.  Thanks.  Disposition: No evidence of imminent risk to self or others at present.   Patient does not meet criteria for psychiatric inpatient admission. Supportive therapy provided about ongoing stressors. Discussed crisis plan, support from social network, calling 911, coming to the Emergency Department, and calling Suicide Hotline.  Mordecai Rasmussen, MD 04/04/2021 2:23 PM

## 2021-04-04 NOTE — Plan of Care (Signed)
Pt admitted to unit this shift. Pt's chief complaint overnight is not sleeping. Pt feels clozapine is causing him to stay awake. Pt states he "is not gonna take it anymore". Pt advised that this medicine is very important for him and to speak with Dr. first. He declined taking trazadone or vistaril last night. He says he thinks they may make things worse. Again, pt advised to speak with doctor. Problem: Education: Goal: Knowledge of General Education information will improve Description: Including pain rating scale, medication(s)/side effects and non-pharmacologic comfort measures Outcome: Progressing   Problem: Safety: Goal: Ability to remain free from injury will improve Outcome: Progressing   Problem: Skin Integrity: Goal: Risk for impaired skin integrity will decrease Outcome: Progressing

## 2021-04-05 ENCOUNTER — Inpatient Hospital Stay: Payer: Medicare Other

## 2021-04-05 DIAGNOSIS — F250 Schizoaffective disorder, bipolar type: Secondary | ICD-10-CM

## 2021-04-05 LAB — HEPATIC FUNCTION PANEL
ALT: 20 U/L (ref 0–44)
AST: 14 U/L — ABNORMAL LOW (ref 15–41)
Albumin: 2.9 g/dL — ABNORMAL LOW (ref 3.5–5.0)
Alkaline Phosphatase: 60 U/L (ref 38–126)
Bilirubin, Direct: 0.1 mg/dL (ref 0.0–0.2)
Indirect Bilirubin: 0.6 mg/dL (ref 0.3–0.9)
Total Bilirubin: 0.7 mg/dL (ref 0.3–1.2)
Total Protein: 5.3 g/dL — ABNORMAL LOW (ref 6.5–8.1)

## 2021-04-05 LAB — URINALYSIS, COMPLETE (UACMP) WITH MICROSCOPIC
Bacteria, UA: NONE SEEN
Bilirubin Urine: NEGATIVE
Glucose, UA: NEGATIVE mg/dL
Hgb urine dipstick: NEGATIVE
Ketones, ur: 5 mg/dL — AB
Leukocytes,Ua: NEGATIVE
Nitrite: NEGATIVE
Protein, ur: NEGATIVE mg/dL
Specific Gravity, Urine: 1.01 (ref 1.005–1.030)
pH: 7 (ref 5.0–8.0)

## 2021-04-05 LAB — BASIC METABOLIC PANEL
Anion gap: 6 (ref 5–15)
Anion gap: 7 (ref 5–15)
BUN: 6 mg/dL — ABNORMAL LOW (ref 8–23)
BUN: 6 mg/dL — ABNORMAL LOW (ref 8–23)
CO2: 29 mmol/L (ref 22–32)
CO2: 28 mmol/L (ref 22–32)
Calcium: 8.3 mg/dL — ABNORMAL LOW (ref 8.9–10.3)
Calcium: 7.8 mg/dL — ABNORMAL LOW (ref 8.9–10.3)
Chloride: 82 mmol/L — ABNORMAL LOW (ref 98–111)
Chloride: 80 mmol/L — ABNORMAL LOW (ref 98–111)
Creatinine, Ser: 0.43 mg/dL — ABNORMAL LOW (ref 0.61–1.24)
Creatinine, Ser: 0.31 mg/dL — ABNORMAL LOW (ref 0.61–1.24)
GFR, Estimated: >60 mL/min (ref >60)
GFR, Estimated: >60 mL/min (ref >60)
Glucose, Bld: 108 mg/dL — ABNORMAL HIGH (ref 70–99)
Glucose, Bld: 101 mg/dL — ABNORMAL HIGH (ref 70–99)
Potassium: 3.5 mmol/L (ref 3.5–5.1)
Potassium: 3.5 mmol/L (ref 3.5–5.1)
Sodium: 117 mmol/L — CL (ref 135–145)
Sodium: 115 mmol/L — CL (ref 135–145)

## 2021-04-05 LAB — SODIUM: Sodium: 115 mmol/L — CL (ref 135–145)

## 2021-04-05 MED ORDER — TEMAZEPAM 7.5 MG PO CAPS
7.5000 mg | ORAL_CAPSULE | Freq: Every day | ORAL | Status: DC
  Administered 2021-04-05 – 2021-04-10 (×5): 7.5 mg via ORAL
  Filled 2021-04-05 (×6): qty 1

## 2021-04-05 MED ORDER — BARIUM SULFATE 2.1 % PO SUSP
900.0000 mL | ORAL | Status: AC
Start: 2021-04-05 — End: 2021-04-05
  Administered 2021-04-05 (×2): 900 mL via ORAL

## 2021-04-05 MED ORDER — CLOZAPINE 25 MG PO TABS
50.0000 mg | ORAL_TABLET | Freq: Every day | ORAL | Status: DC
  Administered 2021-04-05 – 2021-04-08 (×3): 50 mg via ORAL
  Filled 2021-04-05 (×6): qty 2

## 2021-04-05 MED ORDER — DEMECLOCYCLINE HCL 150 MG PO TABS: 300.0000 mg | ORAL_TABLET | Freq: Two times a day (BID) | ORAL | Status: DC

## 2021-04-05 MED ORDER — TOLVAPTAN 15 MG PO TABS
15.0000 mg | ORAL_TABLET | Freq: Once | ORAL | Status: AC
  Administered 2021-04-05: 15 mg via ORAL
  Filled 2021-04-05: qty 1

## 2021-04-05 NOTE — Care Management Important Message (Signed)
Important Message  Patient Details  Name: Brandon [name] MRN: [id number] Date of Birth: [birth year]   Medicare Important Message Given:  Yes     Olegario Messier A Pj Zehner 04/05/2021, 11:15 AM

## 2021-04-05 NOTE — Progress Notes (Signed)
On-call Mansy notified of sodium level of 115.  VWilliams,RN.

## 2021-04-05 NOTE — Progress Notes (Addendum)
PROGRESS NOTE    Brandon [name]  IRC:[id number] DOB: 08-31-1955 DOA: 04/03/2021 PCP: Sherron Monday, MD   Brief Narrative:  Brandon [name] is a 65 y.o. Caucasian male with medical history significant for chronic hyponatremia, COPD, coronary artery disease status post PCI and stent, hypertension, schizoaffective disorder bipolar type, dyslipidemia, schizophrenia, seizure disorder and anemia, who presented to the ER with acute onset of generalized weakness and fatigue worsening over the last 3 to 4 days - repeat labs show worsening hyponatremia from baseline. He reports that he drinks about 2.5 quarts of water that used to be a gallon in the past, 1 cup of coffee, 2 cups of tea with meals and half a cup of orange juice with breakfast.  He tells me that they wanted to recently increase his clozapine to 200 mg daily at his SNF but he feels more than 100 mg/day is significantly sedative and almost paralyzes him.  Psychiatry consulted for further insight and recommendations on medications.  Assessment & Plan:   Acute symptomatic on chronic hyponatremia with generalized weakness, POA.   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 - Continue Norvasc, Coreg and ARB therapy.  Dyslipidemia. -Continue statin  Glaucoma. -Continue Cosopt, Xalatan eyedrops  Coronary artery disease. - We will continue ARB, beta-blocker therapy and as needed sublingual nitroglycerin as well as statin therapy.  BPH. - We will continue Flomax.  Schizoaffective disorder, bipolar type and history of schizophrenia. - We will continue his psychotropic medications per psych - he's now refusing to take clozapine; unclear what our other options are at this point given his poor compliance  DVT prophylaxis: Lovenox Code Status: Full Family Communication: None present  Status is: Inpatient  Dispo: The  patient is from: Home              Anticipated d/c is to: Home              Anticipated d/c date is: 48 to 72 hours pending labs              Patient currently not medically stable for discharge  Consultants:  Psychiatry  Procedures:  None  Antimicrobials:  None  Subjective: No acute issues or events overnight denies nausea vomiting diarrhea constipation headache fevers chills or chest pain  Objective: Vitals:   04/04/21 1528 04/04/21 2006 04/05/21 0417 04/05/21 0729  BP: (!) 125/91 (!) 150/87 (!) 150/86 (!) 150/87  Pulse: 81 77 86 84  Resp: 18 19 19 16   Temp: 98.1 F (36.7 C) 98.1 F (36.7 C) 97.8 F (36.6 C) 97.9 F (36.6 C)  TempSrc:      SpO2: 92% 93% 94% (!) 87%  Weight:      Height:        Intake/Output Summary (Last 24 hours) at 04/05/2021 0748 Last data filed at 04/05/2021 0600 Gross per 24 hour  Intake 1440 ml  Output 1550 ml  Net -110 ml    Filed Weights   04/03/21 1836  Weight: 82.6 kg    Examination:  General:  Pleasantly resting in bed, No acute distress. HEENT:  Normocephalic atraumatic.  Sclerae nonicteric, noninjected.  Extraocular movements intact bilaterally. Neck:  Without mass or deformity.  Trachea is midline. Lungs:  Clear to auscultate bilaterally without rhonchi, wheeze, or rales. Heart:  Regular rate and rhythm.  Without murmurs, rubs, or gallops. Abdomen:  Soft, nontender,  nondistended.  Without guarding or rebound. Extremities: Without cyanosis, clubbing, edema, or obvious deformity. Vascular:  Dorsalis pedis and posterior tibial pulses palpable bilaterally. Skin:  Warm and dry, no erythema, no ulcerations.  Data Reviewed: I have personally reviewed following labs and imaging studies  CBC: Recent Labs  Lab 04/03/21 1850 04/04/21 0405  WBC 7.4  7.5 6.2  NEUTROABS 5.5  --   HGB 11.9*  12.0* 12.2*  HCT 34.6*  34.3* 36.3*  MCV 83.2  83.1 82.7  PLT 278  263 265    Basic Metabolic Panel: Recent Labs  Lab  04/03/21 1850 04/04/21 0405 04/05/21 0426  NA 119* 119* 117*  K 3.4* 3.7 3.5  CL 82* 81* 82*  CO2 30 30 29   GLUCOSE 113* 98 108*  BUN 9 5* 6*  CREATININE 0.49* 0.43* 0.43*  CALCIUM 8.4* 8.3* 8.3*  MG 1.9  --   --     GFR: Estimated Creatinine Clearance: 101 mL/min (A) (by C-G formula based on SCr of 0.43 mg/dL (L)). Liver Function Tests: No results for input(s): AST, ALT, ALKPHOS, BILITOT, PROT, ALBUMIN in the last 168 hours. No results for input(s): LIPASE, AMYLASE in the last 168 hours. No results for input(s): AMMONIA in the last 168 hours. Coagulation Profile: No results for input(s): INR, PROTIME in the last 168 hours. Cardiac Enzymes: No results for input(s): CKTOTAL, CKMB, CKMBINDEX, TROPONINI in the last 168 hours. BNP (last 3 results) No results for input(s): PROBNP in the last 8760 hours. HbA1C: No results for input(s): HGBA1C in the last 72 hours. CBG: Recent Labs  Lab 04/03/21 1946  GLUCAP 110*    Lipid Profile: No results for input(s): CHOL, HDL, LDLCALC, TRIG, CHOLHDL, LDLDIRECT in the last 72 hours. Thyroid Function Tests: No results for input(s): TSH, T4TOTAL, FREET4, T3FREE, THYROIDAB in the last 72 hours. Anemia Panel: No results for input(s): VITAMINB12, FOLATE, FERRITIN, TIBC, IRON, RETICCTPCT in the last 72 hours. Sepsis Labs: No results for input(s): PROCALCITON, LATICACIDVEN in the last 168 hours.  Recent Results (from the past 240 hour(s))  MRSA Next Gen by PCR, Nasal     Status: None   Collection Time: 04/04/21  6:20 AM   Specimen: Nasal Mucosa; Nasal Swab  Result Value Ref Range Status   MRSA by PCR Next Gen NOT DETECTED NOT DETECTED Final    Comment: (NOTE) The GeneXpert MRSA Assay (FDA approved for NASAL specimens only), is one component of a comprehensive MRSA colonization surveillance program. It is not intended to diagnose MRSA infection nor to guide or monitor treatment for MRSA infections. Test performance is not FDA approved in  patients less than 67 years old. Performed at [facility], [address], [place], [place]       Radiology Studies: No results found.  Scheduled Meds:  amLODipine  5 mg Oral Daily   atorvastatin  20 mg Oral QHS   buPROPion ER  100 mg Oral Daily   carvedilol  25 mg Oral BID WC   cholecalciferol  1,000 Units Oral Daily   cloZAPine  100 mg Oral QHS   dorzolamide-timolol  1 drop Both Eyes BID   enoxaparin (LOVENOX) injection  40 mg Subcutaneous Q24H   ezetimibe  10 mg Oral QHS   irbesartan  37.5 mg Oral Daily   isosorbide mononitrate  30 mg Oral BID   latanoprost  1 drop Both Eyes QHS   mometasone-formoterol  2 puff Inhalation BID   pantoprazole  40 mg Oral BID  sodium chloride  1 g Oral TID WC   tamsulosin  0.4 mg Oral Daily   temazepam  15 mg Oral QHS   Continuous Infusions:  sodium chloride 100 mL/hr at 04/03/21 2146     LOS: 2 days   Time spent:  Azucena Fallen, DO [facility]  If 7PM-7AM, please contact night-coverage [email or web address]  04/05/2021, 7:48 AM

## 2021-04-05 NOTE — Consult Note (Signed)
[facility] Face-to-Face Psychiatry Consult   Reason for Consult: Follow-up consult 65 year old man with schizoaffective disorder in the hospital with hyponatremia Referring Physician: [name] Patient Identification: Brandon [name] MRN:  [id number] Principal Diagnosis: Schizoaffective disorder, bipolar type (HCC) Diagnosis:  Principal Problem:   Schizoaffective disorder, bipolar type (HCC) Active Problems:   Hyponatremia   Total Time spent with patient: 30 minutes  Subjective:   Brandon [name] is a 65 y.o. male patient admitted with "I feel very tired".  HPI: As noted in my previous note today the patient had a very low sodium today of 117 despite hospital management.  No evidence that he has been water intoxicating.  As noted previously I have chosen to decrease his clozapine to 50 mg.  MRI of brain has been read and is relatively normal nothing revealing that would explain SIADH.  CT exam results still pending.  Patient looks much worse this evening.  He is pale and very rundown looking slightly confused.  He asks if I can decrease the sleeping medicine dose I gave him last night which seems appropriate.  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  Risk to Self:   Risk to Others:   Prior Inpatient Therapy:   Prior Outpatient Therapy:    Past Medical History:  Past Medical History:  Diagnosis Date   Anemia    IDA   Colon polyps    COPD (chronic obstructive pulmonary disease) (HCC)    Coronary artery disease    Heart attack (HCC) 2005   Hiatal hernia    History of ETOH abuse    Hyperlipidemia    Hypertension    Pleurisy    Schizophrenia (HCC)    Schizophrenia (HCC)    Seizures (HCC)    grand mal in 2000 or 2001 recovery from alcoholism   Suicide attempt [facility])     Past Surgical History:  Procedure Laterality Date   ABDOMINAL SURGERY      internal bleeding   CHOLECYSTECTOMY N/A 05/04/2018   Procedure: LAPAROSCOPIC CHOLECYSTECTOMY, converted to open;  Surgeon: Sung Amabile, DO;  Location: [facility] ORS;  Service: General;  Laterality: N/A;   COLONOSCOPY     COLONOSCOPY WITH PROPOFOL N/A 05/09/2016   Procedure: COLONOSCOPY WITH PROPOFOL;  Surgeon: Scot Jun, MD;  Location: [facility] ENDOSCOPY;  Service: Endoscopy;  Laterality: N/A;   CORONARY ANGIOPLASTY WITH STENT PLACEMENT     1 vessel   ESOPHAGOGASTRODUODENOSCOPY (EGD) WITH PROPOFOL N/A 05/09/2016   Procedure: ESOPHAGOGASTRODUODENOSCOPY (EGD) WITH PROPOFOL;  Surgeon: Scot Jun, MD;  Location: [facility] ENDOSCOPY;  Service: Endoscopy;  Laterality: N/A;   EYE SURGERY Right    GLAUCOMA SURGERY     LAPAROSCOPIC APPENDECTOMY N/A 07/17/2018   Procedure: APPENDECTOMY LAPAROSCOPIC;  Surgeon: Sung Amabile, DO;  Location: [facility] ORS;  Service: General;  Laterality: N/A;   LEFT HEART CATH Right 10/19/2017   Procedure: Left Heart Cath and Coronary Angiography;  Surgeon: Laurier Nancy, MD;  Location: [facility] INVASIVE CV LAB;  Service: Cardiovascular;  Laterality: Right;   NOSE SURGERY     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   VASECTOMY     Family History:  Family History  Problem Relation Age of Onset   Lung cancer Mother    Hypertension Father    Heart attack Father    CAD Father    Prostate cancer Neg Hx  Bladder Cancer Neg Hx    Kidney cancer Neg Hx    Family Psychiatric  History: See previous Social History:  Social History   Substance and Sexual Activity  Alcohol Use No   Comment: no alcohol since 2010     Social History   Substance and Sexual Activity  Drug Use No    Social History   Socioeconomic History   Marital status: Married    Spouse name: Not on file   Number of children: Not on file   Years of education: Not on file   Highest education level: Not on file  Occupational History   Not on file  Tobacco Use   Smoking status: Former    Packs/day: 1.00     Types: Cigarettes    Quit date: 2012    Years since quitting: 10.6   Smokeless tobacco: Former  Building services engineer Use: Never used  Substance and Sexual Activity   Alcohol use: No    Comment: no alcohol since 2010   Drug use: No   Sexual activity: Not on file  Other Topics Concern   Not on file  Social History Narrative   ** Merged History Encounter **       Social Determinants of Health   Financial Resource Strain: Not on file  Food Insecurity: Not on file  Transportation Needs: Not on file  Physical Activity: Not on file  Stress: Not on file  Social Connections: Not on file   Additional Social History:    Allergies:   Allergies  Allergen Reactions   Compazine [Prochlorperazine] Other (See Comments)    Dystonic rxn - convulsions. Near fatal reaction.    Ivp Dye [Iodinated Diagnostic Agents] Shortness Of Breath    Pt states SOB after last IV contrast injection   Alcohol-Sulfur [Elemental Sulfur] Other (See Comments)    History of alcoholism   Benadryl [Diphenhydramine] Other (See Comments)    "stuffy", nasal congestion   Depakote [Divalproex Sodium] Other (See Comments)    Cause elevated ammonia   Dramamine [Dimenhydrinate] Swelling   Plavix [Clopidogrel] Other (See Comments)    Rectal bleeding. "Perforated my intestines."   Rosuvastatin     Other reaction(s): Other (See Comments)    Labs:  Results for orders placed or performed during the hospital encounter of 04/03/21 (from the past 48 hour(s))  Basic metabolic panel     Status: Abnormal   Collection Time: 04/03/21  6:50 PM  Result Value Ref Range   Sodium 119 (LL) 135 - 145 mmol/L    Comment: CRITICAL RESULT CALLED TO, READ BACK BY AND VERIFIED WITH LISA THOMPSON  ON 04/03/21 SKL    Potassium 3.4 (L) 3.5 - 5.1 mmol/L   Chloride 82 (L) 98 - 111 mmol/L   CO2 30 22 - 32 mmol/L   Glucose, Bld 113 (H) 70 - 99 mg/dL    Comment: Glucose reference range applies only to samples taken after fasting for at  least 8 hours.   BUN 9 8 - 23 mg/dL   Creatinine, Ser 4.09 (L) 0.61 - 1.24 mg/dL   Calcium 8.4 (L) 8.9 - 10.3 mg/dL   GFR, Estimated >81 >19 mL/min    Comment: (NOTE) Calculated using the CKD-EPI Creatinine Equation (2021)    Anion gap 7 5 - 15    Comment: Performed at [facility], [address], [place], [place] [address]  CBC     Status: Abnormal   Collection Time: 04/03/21  6:50 PM  Result Value Ref Range   WBC 7.5 4.0 - 10.5 K/uL   RBC 4.13 (L) 4.22 - 5.81 MIL/uL   Hemoglobin 12.0 (L) 13.0 - 17.0 g/dL   HCT 32.9 (L) 92.4 - 26.8 %   MCV 83.1 80.0 - 100.0 fL   MCH 29.1 26.0 - 34.0 pg   MCHC 35.0 30.0 - 36.0 g/dL   RDW 34.1 96.2 - 22.9 %   Platelets 263 150 - 400 K/uL   nRBC 0.0 0.0 - 0.2 %    Comment: Performed at [facility], [address] [place], [place] [address]  Osmolality     Status: Abnormal   Collection Time: 04/03/21  6:50 PM  Result Value Ref Range   Osmolality 242 (LL) 275 - 295 mOsm/kg    Comment: CRITICAL RESULT CALLED TO, READ BACK BY AND VERIFIED WITH: BETH SHELL @2049   ON 04/03/21 SKL REPEATED TO VERIFY Performed at [facility], [address] [place], [place]   CBC with Differential/Platelet     Status: Abnormal   Collection Time: 04/03/21  6:50 PM  Result Value Ref Range   WBC 7.4 4.0 - 10.5 K/uL   RBC 4.16 (L) 4.22 - 5.81 MIL/uL   Hemoglobin 11.9 (L) 13.0 - 17.0 g/dL   HCT 04/05/21 (L) 74.0 - 81.4 %   MCV 83.2 80.0 - 100.0 fL   MCH 28.6 26.0 - 34.0 pg   MCHC 34.4 30.0 - 36.0 g/dL   RDW 48.1 85.6 - 31.4 %   Platelets 278 150 - 400 K/uL   nRBC 0.0 0.0 - 0.2 %   Neutrophils Relative % 73 %   Neutro Abs 5.5 1.7 - 7.7 K/uL   Lymphocytes Relative 14 %   Lymphs Abs 1.0 0.7 - 4.0 K/uL   Monocytes Relative 10 %   Monocytes Absolute 0.7 0.1 - 1.0 K/uL   Eosinophils Relative 1 %   Eosinophils Absolute 0.1 0.0 - 0.5 K/uL   Basophils Relative 1 %   Basophils Absolute 0.0 0.0 - 0.1 K/uL   Immature Granulocytes 1  %   Abs Immature Granulocytes 0.04 0.00 - 0.07 K/uL    Comment: Performed at [facility], [address] [place], [place]  Magnesium     Status: None   Collection Time: 04/03/21  6:50 PM  Result Value Ref Range   Magnesium 1.9 1.7 - 2.4 mg/dL    Comment: Performed at [facility], [address] [place], [place]  CBG monitoring, ED     Status: Abnormal   Collection Time: 04/03/21  7:46 PM  Result Value Ref Range   Glucose-Capillary 110 (H) 70 - 99 mg/dL    Comment: Glucose reference range applies only to samples taken after fasting for at least 8 hours.  Urinalysis, Complete w Microscopic     Status: Abnormal   Collection Time: 04/03/21  8:50 PM  Result Value Ref Range   Color, Urine YELLOW (A) YELLOW   APPearance CLOUDY (A) CLEAR   Specific Gravity, Urine 1.008 1.005 - 1.030   pH 7.0 5.0 - 8.0   Glucose, UA NEGATIVE NEGATIVE mg/dL   Hgb urine dipstick NEGATIVE NEGATIVE   Bilirubin Urine NEGATIVE NEGATIVE   Ketones, ur NEGATIVE NEGATIVE mg/dL   Protein, ur NEGATIVE NEGATIVE mg/dL   Nitrite NEGATIVE NEGATIVE   Leukocytes,Ua NEGATIVE NEGATIVE   RBC / HPF 0-5 0 - 5 RBC/hpf   WBC, UA 0-5 0 - 5 WBC/hpf   Bacteria,  UA RARE (A) NONE SEEN   Squamous Epithelial / LPF NONE SEEN 0 - 5   Mucus PRESENT    Budding Yeast PRESENT     Comment: Performed at [facility], [address] [place], [place] [address]  Osmolality, urine     Status: None   Collection Time: 04/03/21  8:50 PM  Result Value Ref Range   Osmolality, Ur 304 300 - 900 mOsm/kg    Comment: REPEATED TO VERIFY Performed at [facility], [address] [place], [place] [address]   Sodium, urine, random     Status: None   Collection Time: 04/03/21  8:50 PM  Result Value Ref Range   Sodium, Ur 63 mmol/L    Comment: Performed at [facility], [address] [place], [place] [address]  Na and K (sodium & potassium), rand urine     Status: None    Collection Time: 04/03/21  8:50 PM  Result Value Ref Range   Sodium, Ur 63 mmol/L   Potassium Urine 18 mmol/L    Comment: Performed at [facility], [address] [place], [place] [address]  Basic metabolic panel     Status: Abnormal   Collection Time: 04/04/21  4:05 AM  Result Value Ref Range   Sodium 119 (LL) 135 - 145 mmol/L    Comment: CRITICAL RESULT CALLED TO, READ BACK BY AND VERIFIED WITH MARIA BOSTON RN [phone] 04/04/21 HNM    Potassium 3.7 3.5 - 5.1 mmol/L   Chloride 81 (L) 98 - 111 mmol/L   CO2 30 22 - 32 mmol/L   Glucose, Bld 98 70 - 99 mg/dL    Comment: Glucose reference range applies only to samples taken after fasting for at least 8 hours.   BUN 5 (L) 8 - 23 mg/dL   Creatinine, Ser 2.13 (L) 0.61 - 1.24 mg/dL   Calcium 8.3 (L) 8.9 - 10.3 mg/dL   GFR, Estimated >08 >65 mL/min    Comment: (NOTE) Calculated using the CKD-EPI Creatinine Equation (2021)    Anion gap 8 5 - 15    Comment: Performed at [facility], [address] [place], [place] [address]  CBC     Status: Abnormal   Collection Time: 04/04/21  4:05 AM  Result Value Ref Range   WBC 6.2 4.0 - 10.5 K/uL   RBC 4.39 4.22 - 5.81 MIL/uL   Hemoglobin 12.2 (L) 13.0 - 17.0 g/dL   HCT 62.9 (L) 52.8 - 41.3 %   MCV 82.7 80.0 - 100.0 fL   MCH 27.8 26.0 - 34.0 pg   MCHC 33.6 30.0 - 36.0 g/dL   RDW 24.4 01.0 - 27.2 %   Platelets 265 150 - 400 K/uL   nRBC 0.0 0.0 - 0.2 %    Comment: Performed at [facility], [address], [place], [place] [address]  MRSA Next Gen by PCR, Nasal     Status: None   Collection Time: 04/04/21  6:20 AM   Specimen: Nasal Mucosa; Nasal Swab  Result Value Ref Range   MRSA by PCR Next Gen NOT DETECTED NOT DETECTED    Comment: (NOTE) The GeneXpert MRSA Assay (FDA approved for NASAL specimens only), is one component of a comprehensive MRSA colonization surveillance program. It is not intended to diagnose MRSA infection nor to guide or monitor treatment for  MRSA infections. Test performance is not FDA approved in patients less than 60 years old. Performed at [facility], [address] [place], [place] [address]   Basic metabolic panel     Status: Abnormal   Collection Time: 04/05/21  4:26 AM  Result Value Ref Range   Sodium 117 (LL) 135 - 145 mmol/L    Comment: CRITICAL RESULT CALLED TO, READ BACK BY AND VERIFIED WITH JAKEEMA MCNIGHT@0507  04/05/21 RH    Potassium 3.5 3.5 - 5.1 mmol/L   Chloride 82 (L) 98 - 111 mmol/L   CO2 29 22 - 32 mmol/L   Glucose, Bld 108 (H) 70 - 99 mg/dL    Comment: Glucose reference range applies only to samples taken after fasting for at least 8 hours.   BUN 6 (L) 8 - 23 mg/dL   Creatinine, Ser 6.04 (L) 0.61 - 1.24 mg/dL   Calcium 8.3 (L) 8.9 - 10.3 mg/dL   GFR, Estimated >54 >09 mL/min    Comment: (NOTE) Calculated using the CKD-EPI Creatinine Equation (2021)    Anion gap 6 5 - 15    Comment: Performed at [facility], [address] [place], [place] [address]  Urinalysis, Complete w Microscopic Urine, Clean Catch     Status: Abnormal   Collection Time: 04/05/21 12:50 PM  Result Value Ref Range   Color, Urine YELLOW (A) YELLOW   APPearance CLEAR (A) CLEAR   Specific Gravity, Urine 1.010 1.005 - 1.030   pH 7.0 5.0 - 8.0   Glucose, UA NEGATIVE NEGATIVE mg/dL   Hgb urine dipstick NEGATIVE NEGATIVE   Bilirubin Urine NEGATIVE NEGATIVE   Ketones, ur 5 (A) NEGATIVE mg/dL   Protein, ur NEGATIVE NEGATIVE mg/dL   Nitrite NEGATIVE NEGATIVE   Leukocytes,Ua NEGATIVE NEGATIVE   RBC / HPF 0-5 0 - 5 RBC/hpf   WBC, UA 0-5 0 - 5 WBC/hpf   Bacteria, UA NONE SEEN NONE SEEN   Squamous Epithelial / LPF 0-5 0 - 5   Mucus PRESENT     Comment: Performed at [facility], [address] [place], [place] [address]    Current Facility-Administered Medications  Medication Dose Route Frequency Provider Last Rate Last Admin   0.9 %  sodium chloride infusion   Intravenous Continuous Mansy, Jan  A, MD 100 mL/hr at 04/05/21 1142 New Bag at 04/05/21 1142   acetaminophen (TYLENOL) tablet 650 mg  650 mg Oral Q6H PRN Mansy, Jan A, MD       Or   acetaminophen (TYLENOL) suppository 650 mg  650 mg Rectal Q6H PRN Mansy, Jan A, MD       amLODipine (NORVASC) tablet 5 mg  5 mg Oral Daily Mansy, Jan A, MD   5 mg at 04/05/21 5621   atorvastatin (LIPITOR) tablet 20 mg  20 mg Oral QHS Sharen Hones, RPH   20 mg at 04/04/21 2232   buPROPion ER [facility] SR) 12 hr tablet 100 mg  100 mg Oral Daily Mansy, Jan A, MD   100 mg at 04/05/21 3086   carvedilol (COREG) tablet 25 mg  25 mg Oral BID WC Mansy, Jan A, MD   25 mg at 04/05/21 1738   cholecalciferol (VITAMIN D3) tablet 1,000 Units  1,000 Units Oral Daily Mansy, Jan A, MD   1,000 Units at 04/05/21 0940   cloZAPine (CLOZARIL) tablet 50 mg  50 mg Oral QHS Atilla Zollner T, MD       dorzolamide-timolol (COSOPT) 22.3-6.8 MG/ML ophthalmic solution 1 drop  1 drop Both Eyes BID Mansy, Jan A, MD   1 drop at 04/05/21 0933   enoxaparin (  LOVENOX) injection 40 mg  40 mg Subcutaneous Q24H Mansy, Jan A, MD   40 mg at 04/04/21 2231   ezetimibe (ZETIA) tablet 10 mg  10 mg Oral QHS Mansy, Jan A, MD   10 mg at 04/04/21 2232   hydrOXYzine (VISTARIL) capsule 25 mg  25 mg Oral BID PRN Mansy, Jan A, MD       irbesartan (AVAPRO) tablet 37.5 mg  37.5 mg Oral Daily Mansy, Jan A, MD   37.5 mg at 04/05/21 [id number]   isosorbide mononitrate (IMDUR) 24 hr tablet 30 mg  30 mg Oral BID Mansy, Jan A, MD   30 mg at 04/05/21 [id number]   latanoprost (XALATAN) 0.005 % ophthalmic solution 1 drop  1 drop Both Eyes QHS Mansy, Jan A, MD   1 drop at 04/04/21 2232   magnesium hydroxide (MILK OF MAGNESIA) suspension 30 mL  30 mL Oral Daily PRN Mansy, Jan A, MD       mometasone-formoterol [facility](DULERA) 100-5 MCG/ACT inhaler 2 puff  2 puff Inhalation BID Mansy, Jan A, MD   2 puff at 04/05/21 1554   nitroGLYCERIN (NITROSTAT) SL tablet 0.4 mg  0.4 mg Sublingual Q5 min PRN Mansy, Jan A, MD       ondansetron  [facility](ZOFRAN) tablet 4 mg  4 mg Oral Q6H PRN Mansy, Jan A, MD       Or   ondansetron [facility](ZOFRAN) injection 4 mg  4 mg Intravenous Q6H PRN Mansy, Jan A, MD       pantoprazole (PROTONIX) EC tablet 40 mg  40 mg Oral BID Mansy, Jan A, MD   40 mg at 04/05/21 [id number]   senna (SENOKOT) tablet 8.6-17.2 mg  1-2 tablet Oral Daily PRN Mansy, Jan A, MD       sodium chloride tablet 1 g  1 g Oral TID WC Mansy, Jan A, MD   1 g at 04/05/21 1738   tamsulosin (FLOMAX) capsule 0.4 mg  0.4 mg Oral Daily Mansy, Jan A, MD   0.4 mg at 04/05/21 [id number]   temazepam (RESTORIL) capsule 15 mg  15 mg Oral QHS Dolce Sylvia T, MD   15 mg at 04/04/21 2232   tolvaptan (SAMSCA) tablet 15 mg  15 mg Oral Once Azucena [place]en[place], William C, MD       traZODone (DESYREL) tablet 25 mg  25 mg Oral QHS PRN Mansy, Vernetta HoneyJan A, MD        Musculoskeletal: Strength & Muscle Tone: within normal limits Gait & Station: normal Patient leans: N/A            Psychiatric Specialty Exam:  Presentation  General Appearance:  No data recorded Eye Contact: No data recorded Speech: No data recorded Speech Volume: No data recorded Handedness: No data recorded  Mood and Affect  Mood: No data recorded Affect: No data recorded  Thought Process  Thought Processes: No data recorded Descriptions of Associations:No data recorded Orientation:No data recorded Thought Content:No data recorded History of Schizophrenia/Schizoaffective disorder:No data recorded Duration of Psychotic Symptoms:No data recorded Hallucinations:No data recorded Ideas of Reference:No data recorded Suicidal Thoughts:No data recorded Homicidal Thoughts:No data recorded  Sensorium  Memory: No data recorded Judgment: No data recorded Insight: No data recorded  Executive Functions  Concentration: No data recorded Attention Span: No data recorded Recall: No data recorded Fund of Knowledge: No data recorded Language: No data recorded  Psychomotor Activity   Psychomotor Activity: No data recorded  Assets  Assets: No data recorded  Sleep  Sleep: No data  recorded  Physical Exam: Physical Exam Vitals and nursing note reviewed.  Constitutional:      Appearance: Normal appearance.  HENT:     Head: Normocephalic and atraumatic.     Mouth/Throat:     Pharynx: Oropharynx is clear.  Eyes:     Pupils: Pupils are equal, round, and reactive to light.  Cardiovascular:     Rate and Rhythm: Normal rate and regular rhythm.  Pulmonary:     Effort: Pulmonary effort is normal.     Breath sounds: Normal breath sounds.  Abdominal:     General: Abdomen is flat.     Palpations: Abdomen is soft.  Musculoskeletal:        General: Normal range of motion.  Skin:    General: Skin is warm and dry.  Neurological:     General: No focal deficit present.     Mental Status: He is alert. Mental status is at baseline.  Psychiatric:        Attention and Perception: He is inattentive.        Mood and Affect: Mood normal. Affect is blunt.        Speech: Speech is delayed.        Behavior: Behavior is slowed.        Thought Content: Thought content normal. Thought content does not include homicidal or suicidal ideation.        Cognition and Memory: Cognition is impaired.   Review of Systems  Constitutional:  Positive for malaise/fatigue.  HENT: Negative.    Eyes: Negative.   Respiratory: Negative.    Cardiovascular: Negative.   Gastrointestinal: Negative.   Musculoskeletal: Negative.   Skin: Negative.   Neurological: Negative.   Psychiatric/Behavioral:  Negative for depression, substance abuse and suicidal ideas.   Blood pressure 120/83, pulse 80, temperature 98.1 F (36.7 C), resp. rate 20, height 6' (1.829 m), weight 82.6 kg, SpO2 98 %. Body mass index is 24.68 kg/m.  Treatment Plan Summary: Plan I am going to decrease the dose of the Restoril.  No other changes to psychiatric medicine.  Explained plan to patient.  Will follow up over the  weekend  Disposition: No evidence of imminent risk to self or others at present.   Patient does not meet criteria for psychiatric inpatient admission.  Mordecai Rasmussen, MD 04/05/2021 6:04 PM

## 2021-04-05 NOTE — Progress Notes (Signed)
Pharmacy Sodium Monitoring Consult:  Pharmacy consulted to assist in monitoring and replacing electrolytes in this 65 y.o. male admitted on 04/03/2021 with abnormal labs and Weakness Tolvaptan 15 mg x 1 ordered 8/26  Labs:  Sodium (mmol/L)  Date Value  04/05/2021 117 (LL)   Potassium (mmol/L)  Date Value  04/05/2021 3.5   Magnesium (mg/dL)  Date Value  49/17/9150 1.9   Phosphorus (mg/dL)  Date Value  56/97/9480 4.3   Calcium (mg/dL)  Date Value  16/55/3748 8.3 (L)   Albumin (g/dL)  Date Value  27/02/8674 3.2 (L)    8/26 @0426   Na 117   Assessment/Plan: Tolvaptan 15 mg po x 1 ordered - f/u Sodium levels q8h as ordered -notify MD if sodium increases more than 8 mEq/L in 8 hours OR greater than 12 mEq/L in 24 hours.   Jamelle Goldston A 04/05/2021 5:01 PM

## 2021-04-05 NOTE — Consult Note (Signed)
Psychiatry: Follow-up note for this patient with schizoaffective disorder and SIADH.  I saw that his sodium was down to 117 this morning which is the lowest reading I think he has ever had despite hospital care.  I had made no changes to his medicine yesterday.  Based on today's findings I will go ahead and cut the clozapine down to 50 mg with a tentative goal of discontinuing it while I continue to research antipsychotic medicines that may have the lowest risk of SIADH.  I have also ordered a stat urinalysis this morning to help confirm that this is not water intoxication.  I have also ordered a head MRI and I plan to order a chest CT looking for malignancies or injuries that might be causes of SIADH as well.

## 2021-04-05 NOTE — Progress Notes (Signed)
O2 saturation at 87% on room air. 2 liters oxygen per nasal cannula applied. Sats up to (97-98)% on 2 liters. Pt with audible congestion and expiratory wheezes. Inhaler given. Pt encouraged to cough with use of IS. Pt was able to cough up moderate amount of tan-colored mucs and reported feeling better after. MD made aware. Fluid stopped as Pt is headed down for CT.

## 2021-04-06 ENCOUNTER — Inpatient Hospital Stay: Payer: Medicare Other

## 2021-04-06 DIAGNOSIS — J181 Lobar pneumonia, unspecified organism: Secondary | ICD-10-CM

## 2021-04-06 LAB — BLOOD GAS, ARTERIAL
Acid-Base Excess: 6 mmol/L — ABNORMAL HIGH (ref 0.0–2.0)
Acid-Base Excess: 6.6 mmol/L — ABNORMAL HIGH (ref 0.0–2.0)
Acid-Base Excess: 5.3 mmol/L — ABNORMAL HIGH (ref 0.0–2.0)
Allens test (pass/fail): POSITIVE — AB
Bicarbonate: 33.7 mmol/L — ABNORMAL HIGH (ref 20.0–28.0)
Bicarbonate: 32.4 mmol/L — ABNORMAL HIGH (ref 20.0–28.0)
Bicarbonate: 32.6 mmol/L — ABNORMAL HIGH (ref 20.0–28.0)
Delivery systems: POSITIVE
Expiratory PAP: 5
FIO2: 0.32
FIO2: 0.5
FIO2: 40
Inspiratory PAP: 14
MECHVT: 500 mL
Mechanical Rate: 16
O2 Saturation: 88.1 %
O2 Saturation: 93.9 %
O2 Saturation: 90.5 %
PEEP: 5 cmH2O
Patient temperature: 37
Patient temperature: 37
Patient temperature: 37
RATE: 16 resp/min
RATE: 16 resp/min
pCO2 arterial: 64 mmHg — ABNORMAL HIGH (ref 32.0–48.0)
pCO2 arterial: 50 mmHg — ABNORMAL HIGH (ref 32.0–48.0)
pCO2 arterial: 59 mmHg — ABNORMAL HIGH (ref 32.0–48.0)
pH, Arterial: 7.33 — ABNORMAL LOW (ref 7.350–7.450)
pH, Arterial: 7.42 (ref 7.350–7.450)
pH, Arterial: 7.35 (ref 7.350–7.450)
pO2, Arterial: 59 mmHg — ABNORMAL LOW (ref 83.0–108.0)
pO2, Arterial: 69 mmHg — ABNORMAL LOW (ref 83.0–108.0)
pO2, Arterial: 63 mmHg — ABNORMAL LOW (ref 83.0–108.0)

## 2021-04-06 LAB — BASIC METABOLIC PANEL
Anion gap: 6 (ref 5–15)
BUN: 5 mg/dL — ABNORMAL LOW (ref 8–23)
CO2: 32 mmol/L (ref 22–32)
Calcium: 8.7 mg/dL — ABNORMAL LOW (ref 8.9–10.3)
Chloride: 82 mmol/L — ABNORMAL LOW (ref 98–111)
Creatinine, Ser: 0.46 mg/dL — ABNORMAL LOW (ref 0.61–1.24)
GFR, Estimated: >60 mL/min (ref >60)
Glucose, Bld: 111 mg/dL — ABNORMAL HIGH (ref 70–99)
Potassium: 3.6 mmol/L (ref 3.5–5.1)
Sodium: 120 mmol/L — ABNORMAL LOW (ref 135–145)

## 2021-04-06 LAB — SODIUM
Sodium: 121 mmol/L — ABNORMAL LOW (ref 135–145)
Sodium: 121 mmol/L — ABNORMAL LOW (ref 135–145)
Sodium: 123 mmol/L — ABNORMAL LOW (ref 135–145)
Sodium: 124 mmol/L — ABNORMAL LOW (ref 135–145)

## 2021-04-06 LAB — TROPONIN I (HIGH SENSITIVITY): Troponin I (High Sensitivity): 7 ng/L (ref <18)

## 2021-04-06 LAB — RESP PANEL BY RT-PCR (FLU A&B, COVID) ARPGX2
Influenza A by PCR: NEGATIVE
Influenza B by PCR: NEGATIVE
SARS Coronavirus 2 by RT PCR: NEGATIVE

## 2021-04-06 LAB — D-DIMER, QUANTITATIVE: D-Dimer, Quant: 0.34 ug/mL-FEU (ref 0.00–0.50)

## 2021-04-06 LAB — MAGNESIUM: Magnesium: 1.7 mg/dL (ref 1.7–2.4)

## 2021-04-06 LAB — GLUCOSE, CAPILLARY
Glucose-Capillary: 112 mg/dL — ABNORMAL HIGH (ref 70–99)
Glucose-Capillary: 105 mg/dL — ABNORMAL HIGH (ref 70–99)
Glucose-Capillary: 128 mg/dL — ABNORMAL HIGH (ref 70–99)

## 2021-04-06 MED ORDER — FENTANYL CITRATE (PF) 100 MCG/2ML IJ SOLN
INTRAMUSCULAR | Status: AC
  Filled 2021-04-06: qty 2

## 2021-04-06 MED ORDER — NOREPINEPHRINE 4 MG/250ML-% IV SOLN
INTRAVENOUS | Status: AC
  Filled 2021-04-06: qty 250

## 2021-04-06 MED ORDER — PROPOFOL 1000 MG/100ML IV EMUL
INTRAVENOUS | Status: AC
  Filled 2021-04-06: qty 100

## 2021-04-06 MED ORDER — ORAL CARE MOUTH RINSE
15.0000 mL | Freq: Two times a day (BID) | OROMUCOSAL | Status: DC
  Administered 2021-04-07 – 2021-04-09 (×6): 15 mL via OROMUCOSAL

## 2021-04-06 MED ORDER — FUROSEMIDE 10 MG/ML IJ SOLN
20.0000 mg | Freq: Once | INTRAMUSCULAR | Status: AC
  Administered 2021-04-06: 20 mg via INTRAVENOUS
  Filled 2021-04-06: qty 4

## 2021-04-06 MED ORDER — PROPOFOL 1000 MG/100ML IV EMUL
5.0000 ug/kg/min | INTRAVENOUS | Status: DC
  Administered 2021-04-06: 5 ug/kg/min via INTRAVENOUS

## 2021-04-06 MED ORDER — METHYLPREDNISOLONE SODIUM SUCC 40 MG IJ SOLR
20.0000 mg | Freq: Two times a day (BID) | INTRAMUSCULAR | Status: DC
  Administered 2021-04-06 – 2021-04-11 (×11): 20 mg via INTRAVENOUS
  Filled 2021-04-06 (×11): qty 1

## 2021-04-06 MED ORDER — SODIUM CHLORIDE 0.9% FLUSH: 10.0000 mL | INTRAVENOUS | Status: DC | PRN

## 2021-04-06 MED ORDER — FLUCONAZOLE 100MG IVPB
100.0000 mg | INTRAVENOUS | Status: DC
  Administered 2021-04-07 – 2021-04-08 (×2): 100 mg via INTRAVENOUS
  Filled 2021-04-06 (×6): qty 50

## 2021-04-06 MED ORDER — ENOXAPARIN SODIUM 40 MG/0.4ML IJ SOSY: 40.0000 mg | PREFILLED_SYRINGE | INTRAMUSCULAR | Status: DC

## 2021-04-06 MED ORDER — GUAIFENESIN 100 MG/5ML PO SOLN
5.0000 mL | ORAL | Status: DC | PRN
  Filled 2021-04-06: qty 5

## 2021-04-06 MED ORDER — CHLORHEXIDINE GLUCONATE 0.12% ORAL RINSE (MEDLINE KIT)
15.0000 mL | Freq: Two times a day (BID) | OROMUCOSAL | Status: DC
  Administered 2021-04-06 – 2021-04-07 (×3): 15 mL via OROMUCOSAL

## 2021-04-06 MED ORDER — LACTATED RINGERS IV BOLUS
1000.0000 mL | Freq: Once | INTRAVENOUS | Status: AC
  Administered 2021-04-06: 1000 mL via INTRAVENOUS

## 2021-04-06 MED ORDER — VECURONIUM BROMIDE 10 MG IV SOLR
INTRAVENOUS | Status: AC
  Filled 2021-04-06: qty 10

## 2021-04-06 MED ORDER — VECURONIUM BROMIDE 10 MG IV SOLR
10.0000 mg | Freq: Once | INTRAVENOUS | Status: AC
  Administered 2021-04-06: 10 mg via INTRAVENOUS

## 2021-04-06 MED ORDER — ORAL CARE MOUTH RINSE
15.0000 mL | OROMUCOSAL | Status: DC
  Administered 2021-04-06: 15 mL via OROMUCOSAL

## 2021-04-06 MED ORDER — IPRATROPIUM-ALBUTEROL 0.5-2.5 (3) MG/3ML IN SOLN
3.0000 mL | RESPIRATORY_TRACT | Status: DC
  Administered 2021-04-06 – 2021-04-08 (×11): 3 mL via RESPIRATORY_TRACT
  Filled 2021-04-06 (×12): qty 3

## 2021-04-06 MED ORDER — CHLORHEXIDINE GLUCONATE CLOTH 2 % EX PADS
6.0000 | MEDICATED_PAD | Freq: Every day | CUTANEOUS | Status: DC
  Administered 2021-04-06 – 2021-04-11 (×5): 6 via TOPICAL

## 2021-04-06 MED ORDER — ACETYLCYSTEINE 20 % IN SOLN
4.0000 mL | Freq: Once | RESPIRATORY_TRACT | Status: DC
  Filled 2021-04-06: qty 4

## 2021-04-06 MED ORDER — FLUCONAZOLE IN SODIUM CHLORIDE 200-0.9 MG/100ML-% IV SOLN
200.0000 mg | Freq: Once | INTRAVENOUS | Status: AC
  Administered 2021-04-06: 200 mg via INTRAVENOUS
  Filled 2021-04-06: qty 100

## 2021-04-06 MED ORDER — DOCUSATE SODIUM 50 MG/5ML PO LIQD
100.0000 mg | Freq: Two times a day (BID) | ORAL | Status: DC
  Administered 2021-04-06 – 2021-04-07 (×2): 100 mg
  Filled 2021-04-06 (×2): qty 10

## 2021-04-06 MED ORDER — NOREPINEPHRINE 4 MG/250ML-% IV SOLN
2.0000 ug/min | INTRAVENOUS | Status: DC
  Administered 2021-04-06: 10 ug/min via INTRAVENOUS

## 2021-04-06 MED ORDER — BUDESONIDE 0.5 MG/2ML IN SUSP
0.5000 mg | Freq: Two times a day (BID) | RESPIRATORY_TRACT | Status: DC
  Administered 2021-04-06 – 2021-04-11 (×11): 0.5 mg via RESPIRATORY_TRACT
  Filled 2021-04-06 (×12): qty 2

## 2021-04-06 MED ORDER — PROPOFOL 1000 MG/100ML IV EMUL: 0.0000 ug/kg/min | INTRAVENOUS | Status: DC

## 2021-04-06 MED ORDER — SODIUM CHLORIDE 0.9 % IV SOLN
3.0000 g | Freq: Four times a day (QID) | INTRAVENOUS | Status: DC
  Administered 2021-04-06 – 2021-04-11 (×19): 3 g via INTRAVENOUS
  Filled 2021-04-06: qty 8
  Filled 2021-04-06 (×10): qty 3
  Filled 2021-04-06: qty 8
  Filled 2021-04-06: qty 3
  Filled 2021-04-06 (×2): qty 8
  Filled 2021-04-06 (×8): qty 3

## 2021-04-06 MED ORDER — FAMOTIDINE IN NACL 20-0.9 MG/50ML-% IV SOLN
20.0000 mg | INTRAVENOUS | Status: DC
  Administered 2021-04-06 – 2021-04-07 (×2): 20 mg via INTRAVENOUS
  Filled 2021-04-06 (×3): qty 50

## 2021-04-06 MED ORDER — SODIUM CHLORIDE 0.9 % IV SOLN
250.0000 mL | INTRAVENOUS | Status: DC
  Administered 2021-04-06 – 2021-04-07 (×2): 250 mL via INTRAVENOUS

## 2021-04-06 MED ORDER — FENTANYL CITRATE PF 50 MCG/ML IJ SOSY
100.0000 ug | PREFILLED_SYRINGE | Freq: Once | INTRAMUSCULAR | Status: AC
  Administered 2021-04-06: 100 ug via INTRAVENOUS

## 2021-04-06 MED ORDER — MIDAZOLAM HCL 2 MG/2ML IJ SOLN
4.0000 mg | Freq: Once | INTRAMUSCULAR | Status: AC
  Administered 2021-04-06: 4 mg via INTRAVENOUS

## 2021-04-06 MED ORDER — POLYETHYLENE GLYCOL 3350 17 G PO PACK
17.0000 g | PACK | Freq: Every day | ORAL | Status: DC
  Administered 2021-04-06 – 2021-04-11 (×5): 17 g
  Filled 2021-04-06 (×5): qty 1

## 2021-04-06 MED ORDER — MIDAZOLAM HCL 2 MG/2ML IJ SOLN
INTRAMUSCULAR | Status: AC
  Filled 2021-04-06: qty 4

## 2021-04-06 MED ORDER — SODIUM CHLORIDE 0.9% FLUSH
10.0000 mL | Freq: Two times a day (BID) | INTRAVENOUS | Status: DC
  Administered 2021-04-06 – 2021-04-10 (×9): 10 mL

## 2021-04-06 MED ORDER — IPRATROPIUM-ALBUTEROL 0.5-2.5 (3) MG/3ML IN SOLN
3.0000 mL | RESPIRATORY_TRACT | Status: DC | PRN
Start: 2021-04-06 — End: 2021-04-12

## 2021-04-06 NOTE — Progress Notes (Signed)
PROGRESS NOTE    Brandon [name]  CWU:[id number] DOB: November 04, 1955 DOA: 04/03/2021 PCP: Sherron Monday, MD   Brief Narrative:  Brandon [name] is a 65 y.o. Caucasian male with medical history significant for chronic hyponatremia, COPD, coronary artery disease status post PCI and stent, hypertension, schizoaffective disorder bipolar type, dyslipidemia, schizophrenia, seizure disorder and anemia, who presented to the ER with acute onset of generalized weakness and fatigue worsening over the last 3 to 4 days - repeat labs show worsening hyponatremia from baseline. He reports that he drinks about 2.5 quarts of water that used to be a gallon in the past, 1 cup of coffee, 2 cups of tea with meals and half a cup of orange juice with breakfast.  He tells me that they wanted to recently increase his clozapine to 200 mg daily at his SNF but he feels more than 100 mg/day is significantly sedative and almost paralyzes him.  Psychiatry consulted for further insight and recommendations on medications.  Assessment & Plan:   Acute hypoxic respiratory failure in the setting of likely aspiration versus mucous plugging overnight  -Repeat x-ray shows left-sided whiteout, pulmonology consulted transition to their team for intubation bronchoscopy -Continue supportive care oxygen, Mucomyst, guaifenesin and respiratory PT -Once extubated we will gladly take patient back over, appreciate ICU and pulmonology assistance given his acute decompensation   Acute symptomatic on chronic hyponatremia with generalized weakness, POA.   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 - Continue Norvasc, Coreg and ARB therapy.  Dyslipidemia. -Continue statin  Glaucoma. -Continue Cosopt, Xalatan eyedrops  Coronary artery disease. - We will continue ARB, beta-blocker therapy and as needed  sublingual nitroglycerin as well as statin therapy.  BPH. - We will continue Flomax.  Schizoaffective disorder, bipolar type and history of schizophrenia. - We will continue his psychotropic medications per psych - he's now refusing to take clozapine; unclear what our other options are at this point given his poor compliance  DVT prophylaxis: Lovenox Code Status: Full Family Communication: None present  Status is: Inpatient  Dispo: The patient is from: Home              Anticipated d/c is to: Home              Anticipated d/c date is: 48 to 72 hours pending labs              Patient currently not medically stable for discharge  Consultants:  Psychiatry PCCM  Procedures:  Bronchoscopy 04/06/2021  Antimicrobials:  Unasyn 04/06/2021, ongoing  Subjective: No acute issues or events overnight however at shift change patient noted to have worsening respiratory status with profound hypoxia requiring upwards of 6 L nasal cannula with left-sided whiteout on chest x-ray concerning for aspiration or mucous plugging, rapid response called patient sent to ICU for further management as above, review of systems per patient include dyspnea increased cough sputum production but denies any overt chest pain headache fevers chills nausea vomiting diarrhea or constipation  Objective: Vitals:   04/06/21 0623 04/06/21 0636 04/06/21 0731 04/06/21 0741  BP: 96/68 93/65 101/70   Pulse: 82 83 80   Resp: (!) 24 (!) 30 15 (!) 28  Temp: 98.2 F (36.8 C) 98.2 F (36.8 C) 97.7 F (36.5 C)   TempSrc:  Oral    SpO2: 94% 97% 91%   Weight:  Height:        Intake/Output Summary (Last 24 hours) at 04/06/2021 0744 Last data filed at 04/06/2021 0646 Gross per 24 hour  Intake 260 ml  Output 1700 ml  Net -1440 ml    Filed Weights   04/03/21 1836  Weight: 82.6 kg    Examination:  General:  Pleasantly resting in bed, mild distress, tachypneic but nondiaphoretic HEENT:  Normocephalic atraumatic.   Sclerae nonicteric, noninjected.  Extraocular movements intact bilaterally. Neck:  Without mass or deformity.  Trachea is midline. Lungs: Diffuse rhonchi and end expiratory wheeze left-sided without overt rales, right side unremarkable Heart:  Regular rate and rhythm.  Without murmurs, rubs, or gallops. Abdomen:  Soft, nontender, nondistended.  Without guarding or rebound. Extremities: Without cyanosis, clubbing, edema, or obvious deformity. Vascular:  Dorsalis pedis and posterior tibial pulses palpable bilaterally. Skin:  Warm and dry, no erythema, no ulcerations.  Data Reviewed: I have personally reviewed following labs and imaging studies  CBC: Recent Labs  Lab 04/03/21 1850 04/04/21 0405  WBC 7.4  7.5 6.2  NEUTROABS 5.5  --   HGB 11.9*  12.0* 12.2*  HCT 34.6*  34.3* 36.3*  MCV 83.2  83.1 82.7  PLT 278  263 265    Basic Metabolic Panel: Recent Labs  Lab 04/03/21 1850 04/04/21 0405 04/05/21 0426 04/05/21 1715 04/05/21 2018 04/06/21 0147  NA 119* 119* 117* 115* 115* 120*  K 3.4* 3.7 3.5 3.5  --  3.6  CL 82* 81* 82* 80*  --  82*  CO2 30 30 29 28   --  32  GLUCOSE 113* 98 108* 101*  --  111*  BUN 9 5* 6* 6*  --  5*  CREATININE 0.49* 0.43* 0.43* 0.31*  --  0.46*  CALCIUM 8.4* 8.3* 8.3* 7.8*  --  8.7*  MG 1.9  --   --   --   --   --     GFR: Estimated Creatinine Clearance: 101 mL/min (A) (by C-G formula based on SCr of 0.46 mg/dL (L)). Liver Function Tests: Recent Labs  Lab 04/05/21 1715  AST 14*  ALT 20  ALKPHOS 60  BILITOT 0.7  PROT 5.3*  ALBUMIN 2.9*   No results for input(s): LIPASE, AMYLASE in the last 168 hours. No results for input(s): AMMONIA in the last 168 hours. Coagulation Profile: No results for input(s): INR, PROTIME in the last 168 hours. Cardiac Enzymes: No results for input(s): CKTOTAL, CKMB, CKMBINDEX, TROPONINI in the last 168 hours. BNP (last 3 results) No results for input(s): PROBNP in the last 8760 hours. HbA1C: No results for  input(s): HGBA1C in the last 72 hours. CBG: Recent Labs  Lab 04/03/21 1946 04/06/21 0638  GLUCAP 110* 112*    Lipid Profile: No results for input(s): CHOL, HDL, LDLCALC, TRIG, CHOLHDL, LDLDIRECT in the last 72 hours. Thyroid Function Tests: No results for input(s): TSH, T4TOTAL, FREET4, T3FREE, THYROIDAB in the last 72 hours. Anemia Panel: No results for input(s): VITAMINB12, FOLATE, FERRITIN, TIBC, IRON, RETICCTPCT in the last 72 hours. Sepsis Labs: No results for input(s): PROCALCITON, LATICACIDVEN in the last 168 hours.  Recent Results (from the past 240 hour(s))  MRSA Next Gen by PCR, Nasal     Status: None   Collection Time: 04/04/21  6:20 AM   Specimen: Nasal Mucosa; Nasal Swab  Result Value Ref Range Status   MRSA by PCR Next Gen NOT DETECTED NOT DETECTED Final    Comment: (NOTE) The GeneXpert MRSA Assay (FDA approved for  NASAL specimens only), is one component of a comprehensive MRSA colonization surveillance program. It is not intended to diagnose MRSA infection nor to guide or monitor treatment for MRSA infections. Test performance is not FDA approved in patients less than 22 years old. Performed at [facility], [address], [place], [place] [address]       Radiology Studies: DG Chest 1 View  Result Date: 04/06/2021 CLINICAL DATA:  65 year old male with shortness of breath. EXAM: CHEST  1 VIEW COMPARISON:  CT Chest, Abdomen, and Pelvis 04/05/2021. FINDINGS: Portable AP upright view at 0716 hours. Substantial new opacification and volume loss in the left hemithorax with additional leftward shift of the mediastinum. Confluent left lung base opacity mostly obscuring the diaphragm. Visualized tracheal air column is within normal limits. But possible abrupt termination of the left hilar bronchi. No pneumothorax. Calcified aortic atherosclerosis. Right lung remains clear when allowing for portable technique. Retained contrast in the hepatic flexure mixed with  stool. IMPRESSION: 1. Subtotal collapse and/or consolidation of the left lung is new since yesterday. Consider mucous plugging. 2. Negative right lung. 3.  Aortic Atherosclerosis (ICD10-I70.0). Electronically Signed   By: Odessa Fleming M.D.   On: 04/06/2021 07:41   MR BRAIN WO CONTRAST  Result Date: 04/05/2021 CLINICAL DATA:  SIADH of unknown etiology. EXAM: MRI HEAD WITHOUT CONTRAST TECHNIQUE: Multiplanar, multiecho pulse sequences of the brain and surrounding structures were obtained without intravenous contrast. COMPARISON:  Head CT 08/30/2009 FINDINGS: Brain: There is no evidence of an acute infarct, intracranial hemorrhage, mass, midline shift, or extra-axial fluid collection. There is mild cerebral atrophy. Small T2 hyperintensities in the cerebral white matter bilaterally are nonspecific but compatible with mild chronic small vessel ischemic disease. Vascular: Major intracranial vascular flow voids are preserved. Skull and upper cervical spine: Unremarkable bone marrow signal. Sinuses/Orbits: Unremarkable orbits. Large fluid level and mild mucosal thickening in the left maxillary sinus. Clear mastoid air cells. Other: None. IMPRESSION: 1. No acute intracranial abnormality. 2. Mild chronic small vessel ischemic disease and cerebral atrophy. 3. Left maxillary sinusitis. Electronically Signed   By: Sebastian Ache M.D.   On: 04/05/2021 13:17   CT CHEST ABDOMEN PELVIS WO CONTRAST  Result Date: 04/05/2021 CLINICAL DATA:  Syndrome of inappropriate ADH production, unknown etiology. Looking for possible tumors. EXAM: CT CHEST, ABDOMEN AND PELVIS WITHOUT CONTRAST TECHNIQUE: Multidetector CT imaging of the chest, abdomen and pelvis was performed following the standard protocol without IV contrast. COMPARISON:  CT abdomen and pelvis 03/24/2021 FINDINGS: CT CHEST FINDINGS Cardiovascular: Heart is normal in size. Multivessel coronary artery disease. Aortic atherosclerosis. Trace pericardial fluid. Mediastinum/Nodes: No  enlarged mediastinal, hilar, or axillary lymph nodes. Thyroid gland, trachea, and esophagus demonstrate no significant findings. Lungs/Pleura: Patchy airspace opacity spanning 2 cm in the anterior left upper lobe is new compared to radiograph 03/22/2021 (series 4, image 60). There are several adjacent areas of ground-glass opacity within the anterior left upper lobe (series 4, image 58, 78). An additional 1.3 cm patchy airspace opacity is noted in the subpleural left lower lobe (series 4, image 85). Dependent atelectasis in the lung bases. No pleural effusion or pneumothorax. Musculoskeletal: Old healed left lateral fourth through seventh rib fractures. No acute osseous abnormality. No suspicious osseous lesion. CT ABDOMEN PELVIS FINDINGS Hepatobiliary: No focal liver abnormality is seen. Status post cholecystectomy with small amount of residual fluid at the gallbladder fossa versus gallbladder remnant. No biliary dilatation. Pancreas: No pancreatic ductal dilatation or surrounding inflammatory changes. Spleen: Normal in size without focal  abnormality. Adrenals/Urinary Tract: Adrenal glands are unremarkable. Multiple bilateral exophytic renal cysts measuring up to 4.2 cm at the lower pole of the right kidney. Stable 0.8 cm probable hemorrhagic cyst at the posterior aspect of the lower pole of the left kidney (series 2, image 74). No renal calculi. No hydronephrosis. Dependent hyperdensity within the urinary bladder (series 2, image 116). Resolution of the urinary bladder wall thickening noted on most recent exam. Stomach/Bowel: Stomach is within normal limits. No evidence of bowel wall thickening, distention, or inflammatory changes. Oral contrast reaches the proximal transverse colon. Diverticulosis of the descending and sigmoid colon. Vascular/Lymphatic: Aortic calcific atherosclerosis. No abdominal aortic aneurysm. Diffuse marked calcific atherosclerosis in the comment and external iliac arteries as well as the  right common femoral artery. No enlarged lymph nodes in the abdomen or pelvis. Reproductive: Prostate is unremarkable. Other: 1.9 cm fat containing left inguinal hernia. No abdominopelvic ascites. Musculoskeletal: Multilevel degenerative disc disease throughout the lumbar spine, most severe at L4-L5 and L5-S1 with vacuum disc phenomenon. No suspicious osseous lesion. IMPRESSION: 1. No findings suggest malignancy on this unenhanced exam. 2. Interval development of patchy airspace opacities in the left upper and lower lobes since 03/22/2021, concerning for multifocal pneumonia. Given concern for possible malignancy causing SIADH, recommend follow-up radiograph in 6-8 weeks to evaluate for resolution. 3. Dependently located layering hyperdensity within the urinary bladder may represent hemorrhage or possibly tiny layering bladder stones. Recommend correlation with urinalysis for hematuria. 4. Remainder of exam is stable compared to 03/24/2021. Electronically Signed   By: Sherron Ales M.D.   On: 04/05/2021 18:44    Scheduled Meds:  amLODipine  5 mg Oral Daily   atorvastatin  20 mg Oral QHS   buPROPion ER  100 mg Oral Daily   carvedilol  25 mg Oral BID WC   cholecalciferol  1,000 Units Oral Daily   cloZAPine  50 mg Oral QHS   dorzolamide-timolol  1 drop Both Eyes BID   enoxaparin (LOVENOX) injection  40 mg Subcutaneous Q24H   ezetimibe  10 mg Oral QHS   furosemide  20 mg Intravenous Once   irbesartan  37.5 mg Oral Daily   isosorbide mononitrate  30 mg Oral BID   latanoprost  1 drop Both Eyes QHS   mometasone-formoterol  2 puff Inhalation BID   pantoprazole  40 mg Oral BID   sodium chloride  1 g Oral TID WC   tamsulosin  0.4 mg Oral Daily   temazepam  7.5 mg Oral QHS   Continuous Infusions:     LOS: 3 days   Time spent:  Azucena Fallen, DO [facility]  If 7PM-7AM, please contact night-coverage [email or web address]  04/06/2021, 7:44 AM

## 2021-04-06 NOTE — Progress Notes (Signed)
Pt A&OX4, RN witnessed consent for Intubation and bronchoscopy. Time out performed

## 2021-04-06 NOTE — Progress Notes (Signed)
Upon report, Patient shallow abdominal breathing and MEWS red. Respirations 30 at shift change. Dr. [name] notified of chest XR and ABG's at 0755. Obtained new vitals at 0808 per MD request. See flowsheet. Heather from Respiratory in room. Patient increased to 6L of [place]. MD states he will put in Pulmonary consult.Transfer order for ICU placed by MD. Report called to Emily at 0900 with opportunity to ask questions. Patient transferred to ICU bed 9 at 0915.

## 2021-04-06 NOTE — Consult Note (Signed)
NAME:  Brandon [name], MRN:  [name], DOB:  [birth year], LOS: 3 ADMISSION DATE:  04/03/2021   CC abnormal CXR and  History of Present Illness:  65 y.o. Caucasian male with medical history significant for chronic hyponatremia, COPD, coronary artery disease status post PCI and stent, hypertension, schizoaffective disorder bipolar type, dyslipidemia, schizophrenia, seizure disorder and anemia,    who presented to the ER with acute onset of generalized weakness and fatigue worsening over the last 3 to 4 days - repeat labs show worsening hyponatremia from baseline.   REPORT- he drinks about 2.5 quarts of water that used to be a gallon in the past, 1 cup of coffee, 2 cups of tea with meals and half a cup of orange juice with breakfast.    Patient with mental status changes, with hyponatremia, CXR shows LEFT lung opacity  Plan for  bronch bu may need intubation   Significant Hospital Events: Including procedures, antibiotic start and stop dates in addition to other pertinent events   8/26 admitted for weakness and hyponatremia 8/27 transferred to [place] for abnormal CXR and hypoxia     Micro Data:  COVID NEG several weeks   Antimicrobials:   Antibiotics Given (last 72 hours)     None            Objective   Blood pressure 95/68, pulse 86, temperature 98.2 F (36.8 C), resp. rate (!) 24, height 6' (1.829 m), weight 82.6 kg, SpO2 92 %.        Intake/Output Summary (Last 24 hours) at 04/06/2021 [id number] Last data filed at 04/06/2021 [id number] Gross per 24 hour  Intake 260 ml  Output 1700 ml  Net -1440 ml   Filed Weights   04/03/21 1836  Weight: 82.6 kg    Review of Systems: Limited due to confusion and lethargy Other:  All other systems negative   PHYSICAL EXAMINATION:  GENERAL:ill appearing, +resp distress EYES: Pupils equal, round, reactive to light.  No scleral icterus.  MOUTH: Moist mucosal membrane.  NECK: Supple.  PULMONARY: +rhonchi, +wheezing CARDIOVASCULAR:  S1 and S2.  No murmurs  GASTROINTESTINAL: Soft, nontender, -distended. Positive bowel sounds.  MUSCULOSKELETAL: No swelling, clubbing, or edema.  NEUROLOGIC: lethargic SKIN:intact,warm,dry     Labs/imaging that I havepersonally reviewed  (right click and "Reselect all SmartList Selections" daily)      ASSESSMENT AND PLAN SYNOPSIS  65 yo white male with acute hypoxia with LLL opacity with aspiration of mucus plugs with pneumonia complicated by hyponatremia and psychiatric illness  Plan for intubation and BRONCH-patient was alert and awake to provide consent for intubation and bronch Patient with COPD and will likely need 1-2 intubation on VENT  Severe ACUTE Hypoxic and Hypercapnic Respiratory Failure Oxygen as needed  SEVERE COPD EXACERBATION -continue IV steroids as prescribed -continue NEB THERAPY as prescribed -morphine as needed -wean fio2 as needed and tolerated  CARDIAC ICU monitoring   ACUTE KIDNEY INJURY/Renal Failure -continue Foley Catheter-assess need -Avoid nephrotoxic agents -Follow urine output, BMP -Ensure adequate renal perfusion, optimize oxygenation -Renal dose medications BMP Latest Ref Rng & Units 04/06/2021 04/06/2021 04/05/2021  Glucose 70 - 99 mg/dL - 478(G111(H) -  BUN 8 - 23 mg/dL - 5(L) -  Creatinine 9.560.61 - 1.24 mg/dL - 2.13(Y0.46(L) -  Sodium 865135 - 145 mmol/L 121(L) 120(L) 115(LL)  Potassium 3.5 - 5.1 mmol/L - 3.6 -  Chloride 98 - 111 mmol/L - 82(L) -  CO2 22 - 32 mmol/L - 32 -  Calcium 8.9 - 10.3  mg/dL - 8.7(L) -     Intake/Output Summary (Last 24 hours) at 04/06/2021 1962 Last data filed at 04/06/2021 2297 Gross per 24 hour  Intake 260 ml  Output 1700 ml  Net -1440 ml     NEUROLOGY Acute toxic metabolic encephalopathy due to hyponatremia ?etiology   ENDO - ICU hypoglycemic\Hyperglycemia protocol -check FSBS per protocol   GI GI PROPHYLAXIS as indicated  NUTRITIONAL STATUS DIET-->NPO Constipation protocol as  indicated   ELECTROLYTES -follow labs as needed -replace as needed -pharmacy consultation and following     Best practice (right click and "Reselect all SmartList Selections" daily)  Diet: NPO Pain/Anxiety/Delirium protocol (if indicated): No Foley:  N/A Mobility:  bed rest  Code Status:  FULL Disposition:ICU   Labs   CBC: Recent Labs  Lab 04/03/21 1850 04/04/21 0405  WBC 7.4  7.5 6.2  NEUTROABS 5.5  --   HGB 11.9*  12.0* 12.2*  HCT 34.6*  34.3* 36.3*  MCV 83.2  83.1 82.7  PLT 278  263 265    Basic Metabolic Panel: Recent Labs  Lab 04/03/21 1850 04/04/21 0405 04/05/21 0426 04/05/21 1715 04/05/21 2018 04/06/21 0147 04/06/21 0818  NA 119* 119* 117* 115* 115* 120* 121*  K 3.4* 3.7 3.5 3.5  --  3.6  --   CL 82* 81* 82* 80*  --  82*  --   CO2 30 30 29 28   --  32  --   GLUCOSE 113* 98 108* 101*  --  111*  --   BUN 9 5* 6* 6*  --  5*  --   CREATININE 0.49* 0.43* 0.43* 0.31*  --  0.46*  --   CALCIUM 8.4* 8.3* 8.3* 7.8*  --  8.7*  --   MG 1.9  --   --   --   --   --  1.7   GFR: Estimated Creatinine Clearance: 101 mL/min (A) (by C-G formula based on SCr of 0.46 mg/dL (L)). Recent Labs  Lab 04/03/21 1850 04/04/21 0405  WBC 7.4  7.5 6.2    Liver Function Tests: Recent Labs  Lab 04/05/21 1715  AST 14*  ALT 20  ALKPHOS 60  BILITOT 0.7  PROT 5.3*  ALBUMIN 2.9*   No results for input(s): LIPASE, AMYLASE in the last 168 hours. No results for input(s): AMMONIA in the last 168 hours.  ABG    Component Value Date/Time   PHART 7.33 (L) 04/06/2021 0657   PCO2ART 64 (H) 04/06/2021 0657   PO2ART 59 (L) 04/06/2021 0657   HCO3 33.7 (H) 04/06/2021 0657   O2SAT 88.1 04/06/2021 0657     Coagulation Profile: No results for input(s): INR, PROTIME in the last 168 hours.  Cardiac Enzymes: No results for input(s): CKTOTAL, CKMB, CKMBINDEX, TROPONINI in the last 168 hours.  HbA1C: Hemoglobin A1C  Date/Time Value Ref Range Status  09/11/2014 03:25 PM  6.3 4.2 - 6.3 % Final    Comment:    The American Diabetes Association recommends that a primary goal of therapy should be <7% and that physicians should reevaluate the treatment regimen in patients with HbA1c values consistently >8%.    Hgb A1c MFr Bld  Date/Time Value Ref Range Status  11/28/2020 05:53 AM 6.3 (H) 4.8 - 5.6 % Final    Comment:    (NOTE) Pre diabetes:          5.7%-6.4%  Diabetes:              >6.4%  Glycemic  control for   <7.0% adults with diabetes     CBG: Recent Labs  Lab 04/03/21 1946 04/06/21 0638  GLUCAP 110* 112*     Past Medical History:  He,  has a past medical history of Anemia, Colon polyps, COPD (chronic obstructive pulmonary disease) (HCC), Coronary artery disease, Heart attack (HCC) (2005), Hiatal hernia, History of ETOH abuse, Hyperlipidemia, Hypertension, Pleurisy, Schizophrenia (HCC), Schizophrenia (HCC), Seizures (HCC), and Suicide attempt (HCC).   Surgical History:   Past Surgical History:  Procedure Laterality Date   ABDOMINAL SURGERY     internal bleeding   CHOLECYSTECTOMY N/A 05/04/2018   Procedure: LAPAROSCOPIC CHOLECYSTECTOMY, converted to open;  Surgeon: Sung Amabile, DO;  Location: [facility] ORS;  Service: General;  Laterality: N/A;   COLONOSCOPY     COLONOSCOPY WITH PROPOFOL N/A 05/09/2016   Procedure: COLONOSCOPY WITH PROPOFOL;  Surgeon: Scot Jun, MD;  Location: [facility] ENDOSCOPY;  Service: Endoscopy;  Laterality: N/A;   CORONARY ANGIOPLASTY WITH STENT PLACEMENT     1 vessel   ESOPHAGOGASTRODUODENOSCOPY (EGD) WITH PROPOFOL N/A 05/09/2016   Procedure: ESOPHAGOGASTRODUODENOSCOPY (EGD) WITH PROPOFOL;  Surgeon: Scot Jun, MD;  Location: [facility] ENDOSCOPY;  Service: Endoscopy;  Laterality: N/A;   EYE SURGERY Right    GLAUCOMA SURGERY     LAPAROSCOPIC APPENDECTOMY N/A 07/17/2018   Procedure: APPENDECTOMY LAPAROSCOPIC;  Surgeon: Sung Amabile, DO;  Location: [facility] ORS;  Service: General;  Laterality: N/A;   LEFT HEART CATH Right  10/19/2017   Procedure: Left Heart Cath and Coronary Angiography;  Surgeon: Laurier Nancy, MD;  Location: [facility] INVASIVE CV LAB;  Service: Cardiovascular;  Laterality: Right;   NOSE SURGERY     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 His smoking use included cigarettes. He smoked an average of 1 pack per day. He has quit using smokeless tobacco. He reports that he does not drink alcohol and does not use drugs.   Family History:  His family history includes CAD in his father; Heart attack in his father; Hypertension in his father; Lung cancer in his mother. There is no history of Prostate cancer, Bladder Cancer, or Kidney cancer.   Allergies Allergies  Allergen Reactions   Compazine [Prochlorperazine] Other (See Comments)    Dystonic rxn - convulsions. Near fatal reaction.    Ivp Dye [Iodinated Diagnostic Agents] Shortness Of Breath    Pt states SOB after last IV contrast injection   Alcohol-Sulfur [Elemental Sulfur] Other (See Comments)    History of alcoholism   Benadryl [Diphenhydramine] Other (See Comments)    "stuffy", nasal congestion   Depakote [Divalproex Sodium] Other (See Comments)    Cause elevated ammonia   Dramamine [Dimenhydrinate] Swelling   Plavix [Clopidogrel] Other (See Comments)    Rectal bleeding. "Perforated my intestines."   Rosuvastatin     Other reaction(s): Other (See Comments)     Home Medications  Prior to Admission medications   Medication Sig Start Date End Date Taking? Authorizing Provider  acetaminophen (TYLENOL) 325 MG tablet Take 650 mg by mouth every 4 (four) hours as needed for mild pain or moderate pain.   Yes [provider]  amLODipine (NORVASC) 5 MG tablet Take 1 tablet (5 mg total) by mouth daily. 03/26/21  Yes Enedina Finner, MD  budesonide-formoterol [employer] [facility] [facility] [employer] [facility]) 80-4.5 MCG/ACT inhaler Inhale 2 puffs into the lungs 2 (two) times daily.   Yes [provider]  buPROPion (WELLBUTRIN SR) 100 MG 12 hr tablet Take 100 mg by mouth daily.    Yes [provider]  carvedilol (COREG) 25 MG tablet Take 25 mg by mouth 2 (two) times daily with a meal.   Yes [provider]  cholecalciferol (VITAMIN D) 25 MCG tablet Take 1 tablet (1,000 Units total) by mouth daily. 03/26/21  Yes Enedina Finner, MD  cloZAPine (CLOZARIL) 100 MG tablet Take 150 mg by mouth at bedtime.   Yes [provider]  dorzolamide-timolol (COSOPT) 22.3-6.8 MG/ML ophthalmic solution Place 1 drop into both eyes 2 (two) times daily.   Yes [provider]  ezetimibe (ZETIA) 10 MG tablet Take 10 mg by mouth at bedtime.    Yes [provider]  hydrOXYzine (VISTARIL) 25 MG capsule Take 25-50 mg by mouth 3 (three) times daily as needed for anxiety.   Yes [provider]  isosorbide mononitrate (IMDUR) 30 MG 24 hr tablet Take 30 mg by mouth 2 (two) times daily.    Yes [provider]  latanoprost (XALATAN) 0.005 % ophthalmic solution Place 1 drop into both eyes at bedtime. 09/15/17  Yes [provider]  nitroGLYCERIN (NITROSTAT) 0.4 MG SL tablet Place 0.4 mg under the tongue every 5 (five) minutes as needed for chest pain.    Yes [provider]  olmesartan (BENICAR) 20 MG tablet Take 20 mg by mouth daily.   Yes [provider]  pantoprazole (PROTONIX) 40 MG tablet Take 40 mg by mouth 2 (two) times daily.   Yes [provider]  senna (SENOKOT) 8.6 MG tablet Take 1-2 tablets by mouth daily as needed for constipation.   Yes [provider]  simvastatin (ZOCOR) 40 MG tablet Take 40 mg by mouth at bedtime.    Yes [provider]  sodium chloride 1 g tablet Take 1 tablet (1 g total) by mouth 3 (three) times daily with meals. 03/25/21  Yes Enedina Finner, MD  tamsulosin (FLOMAX) 0.4 MG CAPS capsule Take 0.4 mg by mouth daily.    Yes [provider]       DVT/GI PRX  assessed I  Assessed the need for Labs I Assessed the need for Foley I Assessed the need for Central Venous Line Family Discussion when available I Assessed the need for Mobilization I made an Assessment of medications to be adjusted accordingly Safety Risk assessment completed  CASE DISCUSSED IN MULTIDISCIPLINARY ROUNDS WITH ICU TEAM     Critical Care Time devoted to patient care services described in this note is 65  minutes.   Critical care was necessary to treat /prevent imminent and life-threatening deterioration.   Patient is critically ill.    Lucie Leather, M.D.  [facility]  Medical Director [facility] Medical Director [facility] Cardio-Pulmonary Department

## 2021-04-06 NOTE — Consult Note (Signed)
Brandon [name]   Reason for [name]: Follow-up with this 65 year old man history of schizophrenia.  Patient had low sodiums.  Avon Gully Referring Physician: Avon Gully Patient Identification: Brandon [name] MRN:  [id number] Principal Diagnosis: Schizoaffective disorder, bipolar type ([place]) Diagnosis:  Principal Problem:   Schizoaffective disorder, bipolar type ([place]) Active Problems:   Hyponatremia   Lobar pneumonia, unspecified organism ([place])   Total Time spent with patient: 30 minutes  Subjective:   Brandon [name] is a 65 y.o. male patient admitted with patient is currently ventilated and sedated and unable to give history.  HPI: Saw this patient yesterday afternoon and he was looking much more run down tired and confused and sickly.  Had a very low sodium yesterday morning.  Apparently later in the evening patient developed hypoxia or respiratory distress and is now in the intensive care unit on a ventilator.  Results of the CT came back showing various patchy things that sounds like infection but nothing that seemed like a clear sign of malignancy.  Sodium improving today in the ICU.  Past Psychiatric History: Has been developing more and more hyponatremia over the past couple years.  History of schizophrenia.  Had been clinically stable for the most part on clozapine  Risk to Self:   Risk to Others:   Prior Inpatient Therapy:   Prior Outpatient Therapy:    Past Medical History:  Past Medical History:  Diagnosis Date   Anemia    IDA   Colon polyps    COPD (chronic obstructive pulmonary disease) ([place])    Coronary artery disease    Heart attack ([place]) 2005   Hiatal hernia    History of ETOH abuse    Hyperlipidemia    Hypertension    Pleurisy    Schizophrenia ([place])    Schizophrenia ([place])    Seizures ([place])    grand mal in 2000 or 2001 recovery from alcoholism   Suicide attempt [facility])     Past Surgical History:  Procedure Laterality Date   ABDOMINAL  SURGERY     internal bleeding   CHOLECYSTECTOMY N/A 05/04/2018   Procedure: LAPAROSCOPIC CHOLECYSTECTOMY, converted to open;  Surgeon: Benjamine Sprague, DO;  Location: [facility] ORS;  Service: General;  Laterality: N/A;   COLONOSCOPY     COLONOSCOPY WITH PROPOFOL N/A 05/09/2016   Procedure: COLONOSCOPY WITH PROPOFOL;  Surgeon: Manya Silvas, MD;  Location: [facility] ENDOSCOPY;  Service: Endoscopy;  Laterality: N/A;   CORONARY ANGIOPLASTY WITH STENT PLACEMENT     1 vessel   ESOPHAGOGASTRODUODENOSCOPY (EGD) WITH PROPOFOL N/A 05/09/2016   Procedure: ESOPHAGOGASTRODUODENOSCOPY (EGD) WITH PROPOFOL;  Surgeon: Manya Silvas, MD;  Location: [facility] ENDOSCOPY;  Service: Endoscopy;  Laterality: N/A;   EYE SURGERY Right    GLAUCOMA SURGERY     LAPAROSCOPIC APPENDECTOMY N/A 07/17/2018   Procedure: APPENDECTOMY LAPAROSCOPIC;  Surgeon: Benjamine Sprague, DO;  Location: [facility] ORS;  Service: General;  Laterality: N/A;   LEFT HEART CATH Right 10/19/2017   Procedure: Left Heart Cath and Coronary Angiography;  Surgeon: Dionisio David, MD;  Location: [facility] CV LAB;  Service: Cardiovascular;  Laterality: Right;   NOSE SURGERY     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   VASECTOMY     Family History:  Family History  Problem Relation Age of Onset   Lung cancer Mother    Hypertension Father    Heart attack Father    CAD Father    Prostate cancer Neg Hx    Bladder  Cancer Neg Hx    Kidney cancer Neg Hx    Family Psychiatric  History: See previous Social History:  Social History   Substance and Sexual Activity  Alcohol Use No   Comment: no alcohol since 2010     Social History   Substance and Sexual Activity  Drug Use No    Social History   Socioeconomic History   Marital status: Married    Spouse name: Not on file   Number of children: Not on file   Years of education: Not on file   Highest education level: Not on file  Occupational History   Not on file  Tobacco Use   Smoking status: Former    Packs/day:  1.00    Types: Cigarettes    Quit date: 2012    Years since quitting: 10.6   Smokeless tobacco: Former  Scientific laboratory technician Use: Never used  Substance and Sexual Activity   Alcohol use: No    Comment: no alcohol since 2010   Drug use: No   Sexual activity: Not on file  Other Topics Concern   Not on file  Social History Narrative   ** Merged History Encounter **       Social Determinants of Health   Financial Resource Strain: Not on file  Food Insecurity: Not on file  Transportation Needs: Not on file  Physical Activity: Not on file  Stress: Not on file  Social Connections: Not on file   Additional Social History:    Allergies:   Allergies  Allergen Reactions   Compazine [Prochlorperazine] Other (See Comments)    Dystonic rxn - convulsions. Near fatal reaction.    Ivp Dye [Iodinated Diagnostic Agents] Shortness Of Breath    Pt states SOB after last IV contrast injection   Alcohol-Sulfur [Elemental Sulfur] Other (See Comments)    History of alcoholism   Benadryl [Diphenhydramine] Other (See Comments)    "stuffy", nasal congestion   Depakote [Divalproex Sodium] Other (See Comments)    Cause elevated ammonia   Dramamine [Dimenhydrinate] Swelling   Plavix [Clopidogrel] Other (See Comments)    Rectal bleeding. "Perforated my intestines."   Rosuvastatin     Other reaction(s): Other (See Comments)    Labs:  Results for orders placed or performed during the hospital encounter of 04/03/21 (from the past 48 hour(s))  Basic metabolic panel     Status: Abnormal   Collection Time: 04/05/21  4:26 AM  Result Value Ref Range   Sodium 117 (LL) 135 - 145 mmol/L    Comment: CRITICAL RESULT CALLED TO, READ BACK BY AND VERIFIED WITH JAKEEMA [id number]_0  04/05/21 RH    Potassium 3.5 3.5 - 5.1 mmol/L   Chloride 82 (L) 98 - 111 mmol/L   CO2 29 22 - 32 mmol/L   Glucose, Bld 108 (H) 70 - 99 mg/dL    Comment: Glucose reference range applies only to samples taken after fasting for  at least 8 hours.   BUN 6 (L) 8 - 23 mg/dL   Creatinine, Ser 0.43 (L) 0.61 - 1.24 mg/dL   Calcium 8.3 (L) 8.9 - 10.3 mg/dL   GFR, Estimated >60 >60 mL/min    Comment: (NOTE) Calculated using the CKD-EPI Creatinine Equation (2021)    Anion gap 6 5 - 15    Comment: Performed at [facility], [address] [place], [place] [address]  Urinalysis, Complete w Microscopic Urine, Clean Catch     Status: Abnormal   Collection Time: 04/05/21  12:50 PM  Result Value Ref Range   Color, Urine YELLOW (A) YELLOW   APPearance CLEAR (A) CLEAR   Specific Gravity, Urine 1.010 1.005 - 1.030   pH 7.0 5.0 - 8.0   Glucose, UA NEGATIVE NEGATIVE mg/dL   Hgb urine dipstick NEGATIVE NEGATIVE   Bilirubin Urine NEGATIVE NEGATIVE   Ketones, ur 5 (A) NEGATIVE mg/dL   Protein, ur NEGATIVE NEGATIVE mg/dL   Nitrite NEGATIVE NEGATIVE   Leukocytes,Ua NEGATIVE NEGATIVE   RBC / HPF 0-5 0 - 5 RBC/hpf   WBC, UA 0-5 0 - 5 WBC/hpf   Bacteria, UA NONE SEEN NONE SEEN   Squamous Epithelial / LPF 0-5 0 - 5   Mucus PRESENT     Comment: Performed at [facility], [address] [place], [place] [address]  BASELINE basic metabolic panel - IF NOT already drawn     Status: Abnormal   Collection Time: 04/05/21  5:15 PM  Result Value Ref Range   Sodium 115 (LL) 135 - 145 mmol/L    Comment: CRITICAL RESULT CALLED TO, READ BACK BY AND VERIFIED WITH VERA WILLIAMS AT 1805 04/05/21.PMF   Potassium 3.5 3.5 - 5.1 mmol/L   Chloride 80 (L) 98 - 111 mmol/L   CO2 28 22 - 32 mmol/L   Glucose, Bld 101 (H) 70 - 99 mg/dL    Comment: Glucose reference range applies only to samples taken after fasting for at least 8 hours.   BUN 6 (L) 8 - 23 mg/dL   Creatinine, Ser 0.31 (L) 0.61 - 1.24 mg/dL   Calcium 7.8 (L) 8.9 - 10.3 mg/dL   GFR, Estimated >60 >60 mL/min    Comment: (NOTE) Calculated using the CKD-EPI Creatinine Equation (2021)    Anion gap 7 5 - 15    Comment: Performed at [facility], [address] [place], [place] [address]  Hepatic function panel (Baseline)     Status: Abnormal   Collection Time: 04/05/21  5:15 PM  Result Value Ref Range   Total Protein 5.3 (L) 6.5 - 8.1 g/dL   Albumin 2.9 (L) 3.5 - 5.0 g/dL   AST 14 (L) 15 - 41 U/L   ALT 20 0 - 44 U/L   Alkaline Phosphatase 60 38 - 126 U/L   Total Bilirubin 0.7 0.3 - 1.2 mg/dL   Bilirubin, Direct 0.1 0.0 - 0.2 mg/dL   Indirect Bilirubin 0.6 0.3 - 0.9 mg/dL    Comment: Performed at [facility], [address] [place], [place] [address]  sodium     Status: Abnormal   Collection Time: 04/05/21  8:18 PM  Result Value Ref Range   Sodium 115 (LL) 135 - 145 mmol/L    Comment: CRITICAL RESULT CALLED TO, READ BACK BY AND VERIFIED WITH MARIA BOSTON AT 2058 04/05/2021 DLB Performed at [facility], [address] [place], [place] [address]   Basic metabolic panel     Status: Abnormal   Collection Time: 04/06/21  1:47 AM  Result Value Ref Range   Sodium 120 (L) 135 - 145 mmol/L   Potassium 3.6 3.5 - 5.1 mmol/L   Chloride 82 (L) 98 - 111 mmol/L   CO2 32 22 - 32 mmol/L   Glucose, Bld 111 (H) 70 - 99 mg/dL    Comment: Glucose reference range applies only to samples taken after fasting for at least 8 hours.   BUN 5 (L) 8 - 23 mg/dL   Creatinine, Ser 0.46 (L) 0.61 -  1.24 mg/dL   Calcium 8.7 (L) 8.9 - 10.3 mg/dL   GFR, Estimated >60 >60 mL/min    Comment: (NOTE) Calculated using the CKD-EPI Creatinine Equation (2021)    Anion gap 6 5 - 15    Comment: Performed at [facility], [address] [place], [place] [address]  Glucose, capillary     Status: Abnormal   Collection Time: 04/06/21  6:38 AM  Result Value Ref Range   Glucose-Capillary 112 (H) 70 - 99 mg/dL    Comment: Glucose reference range applies only to samples taken after fasting for at least 8 hours.  Blood gas, arterial     Status: Abnormal   Collection Time: 04/06/21  6:57 AM  Result Value Ref Range   FIO2 0.32    pH, Arterial 7.33 (L)  7.350 - 7.450   pCO2 arterial 64 (H) 32.0 - 48.0 mmHg   pO2, Arterial 59 (L) 83.0 - 108.0 mmHg   Bicarbonate 33.7 (H) 20.0 - 28.0 mmol/L   Acid-Base Excess 6.0 (H) 0.0 - 2.0 mmol/L   O2 Saturation 88.1 %   Patient temperature 37.0    Collection site RIGHT RADIAL    Sample type ARTERIAL DRAW    Allens test (pass/fail) PASS PASS    Comment: Performed at [facility], [address] [place], [place] [address]  sodium     Status: Abnormal   Collection Time: 04/06/21  8:18 AM  Result Value Ref Range   Sodium 121 (L) 135 - 145 mmol/L    Comment: Performed at [facility], [place], [place] [address]  Troponin I (High Sensitivity)     Status: None   Collection Time: 04/06/21  8:18 AM  Result Value Ref Range   Troponin I (High Sensitivity) 7 <18 ng/L    Comment: (NOTE) Elevated high sensitivity troponin I (hsTnI) values and significant  changes across serial measurements may suggest ACS but many other  chronic and acute conditions are known to elevate hsTnI results.  Refer to the "Links" section for chest pain algorithms and additional  guidance. Performed at [facility], [address] [place], [place] [address]   D-dimer, quantitative     Status: None   Collection Time: 04/06/21  8:18 AM  Result Value Ref Range   D-Dimer, Quant 0.34 0.00 - 0.50 ug/mL-FEU    Comment: (NOTE) At the manufacturer cut-off value of 0.5 g/mL FEU, this assay has a negative predictive value of 95-100%.This assay is intended for use in conjunction with a clinical pretest probability (PTP) assessment model to exclude pulmonary embolism (PE) and deep venous thrombosis (DVT) in outpatients suspected of PE or DVT. Results should be correlated with clinical presentation. Performed at [facility], [address] [place], [place] [address]   Magnesium     Status: None   Collection Time: 04/06/21  8:18 AM  Result Value Ref Range   Magnesium 1.7 1.7 -  2.4 mg/dL    Comment: Performed at [facility], [address] [place],  [address]  Resp Panel by RT-PCR (Flu A&B, Covid)     Status: None   Collection Time: 04/06/21  9:24 AM  Result Value Ref Range   SARS Coronavirus 2 by RT PCR NEGATIVE NEGATIVE    Comment: (NOTE) SARS-CoV-2 target nucleic acids are NOT DETECTED.  The SARS-CoV-2 RNA is generally detectable in upper respiratory specimens during the acute phase of infection. The lowest concentration of SARS-CoV-2 viral copies this assay  can detect is 138 copies/mL. A negative result does not preclude SARS-Cov-2 infection and should not be used as the sole basis for treatment or other patient management decisions. A negative result may occur with  improper specimen collection/handling, submission of specimen other than nasopharyngeal swab, presence of viral mutation(s) within the areas targeted by this assay, and inadequate number of viral copies(<138 copies/mL). A negative result must be combined with clinical observations, patient history, and epidemiological information. The expected result is Negative.  Fact Sheet for Patients:  EntrepreneurPulse.com.au  Fact Sheet for Healthcare Providers:  IncredibleEmployment.be  This test is no t yet approved or cleared by the [place] FDA and  has been authorized for detection and/or diagnosis of SARS-CoV-2 by FDA under an Emergency Use Authorization (EUA). This EUA will remain  in effect (meaning this test can be used) for the duration of the COVID-19 declaration under Section 564(b)(1) of the Act, 21 U.S.C.section 360bbb-3(b)(1), unless the authorization is terminated  or revoked sooner.       Influenza A by PCR NEGATIVE NEGATIVE   Influenza B by PCR NEGATIVE NEGATIVE    Comment: (NOTE) The Xpert Xpress SARS-CoV-2/FLU/RSV plus assay is intended as an aid in the diagnosis of influenza from Nasopharyngeal swab specimens  and should not be used as a sole basis for treatment. Nasal washings and aspirates are unacceptable for Xpert Xpress SARS-CoV-2/FLU/RSV testing.  Fact Sheet for Patients: EntrepreneurPulse.com.au  Fact Sheet for Healthcare Providers: IncredibleEmployment.be  This test is not yet approved or cleared by the [place] FDA and has been authorized for detection and/or diagnosis of SARS-CoV-2 by FDA under an Emergency Use Authorization (EUA). This EUA will remain in effect (meaning this test can be used) for the duration of the COVID-19 declaration under Section 564(b)(1) of the Act, 21 U.S.C. section 360bbb-3(b)(1), unless the authorization is terminated or revoked.  Performed at [facility], [address] [place], [place] [address]   Glucose, capillary     Status: Abnormal   Collection Time: 04/06/21  9:39 AM  Result Value Ref Range   Glucose-Capillary 105 (H) 70 - 99 mg/dL    Comment: Glucose reference range applies only to samples taken after fasting for at least 8 hours.  Sodium     Status: Abnormal   Collection Time: 04/06/21 11:52 AM  Result Value Ref Range   Sodium 121 (L) 135 - 145 mmol/L    Comment: Performed at [facility], [address] [place], [place] [address]    Current Facility-Administered Medications  Medication Dose Route Frequency Provider Last Rate Last Admin   acetaminophen (TYLENOL) tablet 650 mg  650 mg Oral Q6H PRN Mansy, Jan A, MD       Or   acetaminophen (TYLENOL) suppository 650 mg  650 mg Rectal Q6H PRN Mansy, Jan A, MD       amLODipine (NORVASC) tablet 5 mg  5 mg Oral Daily Mansy, Jan A, MD   5 mg at 04/05/21 0928   Ampicillin-Sulbactam (UNASYN) 3 g in sodium chloride 0.9 % 100 mL IVPB  3 g Intravenous Q6H Nazari, Walid A, RPH       atorvastatin (LIPITOR) tablet 20 mg  20 mg Oral QHS Virl Cagey E, RPH   20 mg at 04/05/21 2247   budesonide (PULMICORT) nebulizer solution 0.5 mg  0.5  mg Nebulization BID Flora Lipps, MD   0.5 mg at 04/06/21 1302   buPROPion ER (WELLBUTRIN SR) 12 hr tablet 100 mg  100 mg  Oral Daily Mansy, Jan A, MD   100 mg at 04/05/21 6283   carvedilol (COREG) tablet 25 mg  25 mg Oral BID WC Mansy, Jan A, MD   25 mg at 04/05/21 1738   chlorhexidine gluconate (MEDLINE KIT) (PERIDEX) 0.12 % solution 15 mL  15 mL Mouth Rinse BID Flora Lipps, MD       Chlorhexidine Gluconate Cloth 2 % PADS 6 each  6 each Topical T5176 Flora Lipps, MD   6 each at 04/06/21 1146   cholecalciferol (VITAMIN D3) tablet 1,000 Units  1,000 Units Oral Daily Mansy, Jan A, MD   1,000 Units at 04/05/21 0940   cloZAPine (CLOZARIL) tablet 50 mg  50 mg Oral QHS Iraida Cragin T, MD   50 mg at 04/05/21 2246   docusate (COLACE) 50 MG/5ML liquid 100 mg  100 mg Per Tube BID Flora Lipps, MD       dorzolamide-timolol (COSOPT) 22.3-6.8 MG/ML ophthalmic solution 1 drop  1 drop Both Eyes BID Mansy, Jan A, MD   1 drop at 04/05/21 2249   enoxaparin (LOVENOX) injection 40 mg  40 mg Subcutaneous Q24H Mansy, Jan A, MD   40 mg at 04/05/21 2248   ezetimibe (ZETIA) tablet 10 mg  10 mg Oral QHS Mansy, Jan A, MD   10 mg at 04/05/21 2246   famotidine (PEPCID) IVPB 20 mg premix  20 mg Intravenous Q24H Flora Lipps, MD       guaiFENesin (ROBITUSSIN) 100 MG/5ML solution 100 mg  5 mL Oral Q4H PRN Little Ishikawa, MD       hydrOXYzine (VISTARIL) capsule 25 mg  25 mg Oral BID PRN Mansy, Jan A, MD       ipratropium-albuterol (DUONEB) 0.5-2.5 (3) MG/3ML nebulizer solution 3 mL  3 mL Nebulization Q2H PRN Little Ishikawa, MD       ipratropium-albuterol (DUONEB) 0.5-2.5 (3) MG/3ML nebulizer solution 3 mL  3 mL Nebulization Q4H Flora Lipps, MD   3 mL at 04/06/21 1302   irbesartan (AVAPRO) tablet 37.5 mg  37.5 mg Oral Daily Mansy, Jan A, MD   37.5 mg at 04/05/21 1607   isosorbide mononitrate (IMDUR) 24 hr tablet 30 mg  30 mg Oral BID Mansy, Jan A, MD   30 mg at 04/05/21 2247   latanoprost (XALATAN) 0.005 %  ophthalmic solution 1 drop  1 drop Both Eyes QHS Mansy, Jan A, MD   1 drop at 04/05/21 2249   magnesium hydroxide (MILK OF MAGNESIA) suspension 30 mL  30 mL Oral Daily PRN Mansy, Jan A, MD       MEDLINE mouth rinse  15 mL Mouth Rinse 10 times per day Flora Lipps, MD       methylPREDNISolone sodium succinate (SOLU-MEDROL) 40 mg/mL injection 20 mg  20 mg Intravenous Q12H Flora Lipps, MD       nitroGLYCERIN (NITROSTAT) SL tablet 0.4 mg  0.4 mg Sublingual Q5 min PRN Mansy, Jan A, MD       ondansetron [facility]) tablet 4 mg  4 mg Oral Q6H PRN Mansy, Jan A, MD       Or   ondansetron [facility]) injection 4 mg  4 mg Intravenous Q6H PRN Mansy, Jan A, MD       pantoprazole (PROTONIX) EC tablet 40 mg  40 mg Oral BID Mansy, Jan A, MD   40 mg at 04/05/21 2247   polyethylene glycol (MIRALAX / GLYCOLAX) packet 17 g  17 g Per Tube Daily Flora Lipps, MD  propofol (DIPRIVAN) 1000 MG/100ML infusion  0-50 mcg/kg/min Intravenous Continuous Flora Lipps, MD       senna (SENOKOT) tablet 8.6-17.2 mg  1-2 tablet Oral Daily PRN Mansy, Jan A, MD   8.6 mg at 04/05/21 2032   sodium chloride flush (NS) 0.9 % injection 10-40 mL  10-40 mL Intracatheter Q12H Little Ishikawa, MD       sodium chloride flush (NS) 0.9 % injection 10-40 mL  10-40 mL Intracatheter PRN Little Ishikawa, MD       sodium chloride tablet 1 g  1 g Oral TID WC Mansy, Jan A, MD   1 g at 04/05/21 1738   tamsulosin (FLOMAX) capsule 0.4 mg  0.4 mg Oral Daily Mansy, Jan A, MD   0.4 mg at 04/05/21 9628   temazepam (RESTORIL) capsule 7.5 mg  7.5 mg Oral QHS Shakyia Bosso T, MD   7.5 mg at 04/05/21 2246   traZODone (DESYREL) tablet 25 mg  25 mg Oral QHS PRN Mansy, Arvella Merles, MD        Musculoskeletal: Strength & Muscle Tone: decreased Gait & Station: unable to stand Patient leans: N/A            Psychiatric Specialty Exam:  Presentation  General Appearance:  No data recorded Eye Contact: No data recorded Speech: No data  recorded Speech Volume: No data recorded Handedness: No data recorded  Mood and Affect  Mood: No data recorded Affect: No data recorded  Thought Process  Thought Processes: No data recorded Descriptions of Associations:No data recorded Orientation:No data recorded Thought Content:No data recorded History of Schizophrenia/Schizoaffective disorder:No data recorded Duration of Psychotic Symptoms:No data recorded Hallucinations:No data recorded Ideas of Reference:No data recorded Suicidal Thoughts:No data recorded Homicidal Thoughts:No data recorded  Sensorium  Memory: No data recorded Judgment: No data recorded Insight: No data recorded  Executive Functions  Concentration: No data recorded Attention Span: No data recorded Recall: No data recorded Fund of Knowledge: No data recorded Language: No data recorded  Psychomotor Activity  Psychomotor Activity: No data recorded  Assets  Assets: No data recorded  Sleep  Sleep: No data recorded  Physical Exam: Physical Exam Vitals and nursing note reviewed.  Constitutional:      Appearance: Normal appearance.  HENT:     Head: Normocephalic and atraumatic.     Mouth/Throat:     Pharynx: Oropharynx is clear.  Eyes:     Pupils: Pupils are equal, round, and reactive to light.  Cardiovascular:     Rate and Rhythm: Normal rate and regular rhythm.  Pulmonary:     Breath sounds: Normal breath sounds.    Abdominal:     General: Abdomen is flat.     Palpations: Abdomen is soft.  Musculoskeletal:        General: Normal range of motion.  Skin:    General: Skin is warm and dry.  Neurological:     General: No focal deficit present.     Mental Status: Mental status is at baseline.   Review of Systems  Unable to perform ROS: Intubated  Blood pressure 113/79, pulse (!) 108, temperature 98.8 F (37.1 C), temperature source Oral, resp. rate 16, height 6' (1.829 m), weight 78.7 kg, SpO2 100 %. Body mass index is  23.53 kg/m.  Treatment Plan Summary: Plan patient is currently not receiving his oral medicine which means no Wellbutrin or clozapine.  That is probably all for the best.  Obviously current needs have changed to the more life  or death ones being on a ventilator.  We will continue to follow.  Disposition:  Will continue to follow up  Alethia Berthold, MD 04/06/2021 1:25 PM

## 2021-04-06 NOTE — Progress Notes (Signed)
Pt. Extubated and placed on 6lnc,sat 92%.

## 2021-04-06 NOTE — Progress Notes (Signed)
GOALS OF CARE DISCUSSION  The Clinical status was relayed to family in detail.   Patient was extubated after BRONCH 20 mins post extubation, patient had increased WOB and placed on biPAP Will follow resp status very closely.    Updated and notified of patients medical condition. Patient is having a weak cough and struggling to remove secretions.   Patient with increased WOB and using accessory muscles to breathe On biPAP Explained to family course of therapy and the modalities    PATIENT REMAINS FULL CODE  Family understands the situation.   Family are satisfied with Plan of action and management. All questions answered  Additional CC time 35 mins   Brandon [name] Glad, M.D.  [facility]  Medical Director [facility] Medical Director [facility] Cardio-Pulmonary Department

## 2021-04-06 NOTE — Progress Notes (Signed)
ETT pulled back to 26cm per xray results at 1255.

## 2021-04-06 NOTE — Plan of Care (Signed)
Pt's lung sounds more congested this shift, cough is weak, hospitalist notified. Lasix 20mg  ordered and given. Na at 120, pt still lethargic, but oriented. Problem: Nutrition: Goal: Adequate nutrition will be maintained Outcome: Progressing   Problem: Coping: Goal: Level of anxiety will decrease Outcome: Progressing   Problem: Safety: Goal: Ability to remain free from injury will improve Outcome: Progressing   Problem: Skin Integrity: Goal: Risk for impaired skin integrity will decrease Outcome: Progressing

## 2021-04-06 NOTE — Progress Notes (Signed)
Brandon [name] was intubated ato 1136 by this RT with a 8.0 ETT via glidescope on the first attempt.  The ETT was secured at the 27cm mark.  A bronchoscopy was performed by Dr. Belia Heman using a disposable AMBU bronchoscope.  A timeout was performed at 1138.  The scope was introduced into the airway at 1139 and removed at 1143.  A bronchial wash was obtained and sent to the lab.  Brandon [name] will remain on the ventilator today.

## 2021-04-06 NOTE — Consult Note (Signed)
Pharmacy Antibiotic Note  Brandon [name] is a 65 y.o. male admitted on 04/03/2021 with aspiration PNA. S/p extubation and bronchoscopy. Bronch findinds c/f oropharyngeal candidiasis  Pharmacy has been consulted for fluconazole dosing.  Plan: Fluconazole 200 mg IV x 1 followed by Fluconazole 100 mg Q24H Change to PO when able   Height: 6' (182.9 cm) Weight: 78.7 kg (173 lb 8 oz) IBW/kg (Calculated) : 77.6  Temp (24hrs), Avg:98.1 F (36.7 C), Min:97.5 F (36.4 C), Max:98.8 F (37.1 C)  Recent Labs  Lab 04/03/21 1850 04/04/21 0405 04/05/21 0426 04/05/21 1715 04/06/21 0147  WBC 7.4  7.5 6.2  --   --   --   CREATININE 0.49* 0.43* 0.43* 0.31* 0.46*    Estimated Creatinine Clearance: 101 mL/min (A) (by C-G formula based on SCr of 0.46 mg/dL (L)).    Allergies  Allergen Reactions   Compazine [Prochlorperazine] Other (See Comments)    Dystonic rxn - convulsions. Near fatal reaction.    Ivp Dye [Iodinated Diagnostic Agents] Shortness Of Breath    Pt states SOB after last IV contrast injection   Alcohol-Sulfur [Elemental Sulfur] Other (See Comments)    History of alcoholism   Benadryl [Diphenhydramine] Other (See Comments)    "stuffy", nasal congestion   Depakote [Divalproex Sodium] Other (See Comments)    Cause elevated ammonia   Dramamine [Dimenhydrinate] Swelling   Plavix [Clopidogrel] Other (See Comments)    Rectal bleeding. "Perforated my intestines."   Rosuvastatin     Other reaction(s): Other (See Comments)    Antimicrobials this admission: 8/27 unasyn >>  8/27 fluconazole >>   Dose adjustments this admission: N/a  Microbiology results: 8/27 BAL: sent  8/27 MRSA PCR: not detected   Thank you for allowing pharmacy to be a part of this patient's care.  Dorothe Pea, PharmD, BCPS Clinical Pharmacist   04/06/2021 6:50 PM

## 2021-04-06 NOTE — Procedures (Signed)
PROCEDURE: BRONCHOSCOPY Therapeutic Aspiration of Tracheobronchial Tree  PROCEDURE DATE: 04/06/2021  TIME:  NAME:  Brandon [name]  DOB:30-Mar-1956  MRN: [id number] LOC:  IC09A/IC09A-AA    HOSP DAY: _0 @ CODE STATUS:      Code Status Orders  (From admission, onward)           Start     Ordered   04/03/21 2037  Full code  Continuous        04/03/21 2041           Code Status History     Date Active Date Inactive Code Status Order ID Comments User Context   03/22/2021 1851 03/25/2021 2231 Full Code [id number]  Orene Desanctis, DO ED   11/27/2020 2331 12/03/2020 2155 Full Code [id number]  Artist Beach, MD ED   07/17/2018 1249 07/20/2018 1726 Full Code [id number]  Benjamine Sprague, DO Inpatient   05/04/2018 2029 05/07/2018 1634 Full Code [id number]  Benjamine Sprague, DO Inpatient   04/08/2018 2011 04/12/2018 1441 Full Code [id number]  Benjamine Sprague, DO Inpatient   10/19/2017 1252 10/19/2017 1820 Full Code [id number]  Dionisio David, MD Inpatient           Indications/Preliminary Diagnosis: aspiration pneumonia  Consent: (Place X beside choice/s below)  The benefits, risks and possible complications of the procedure were        explained to:  ___ patient  ___ patient's family  ___ other:___________  who verbalized understanding and gave:  ___ verbal  ___ written  _x__ verbal and written  ___ telephone  ___ other:________ consent.      Unable to obtain consent; procedure performed on emergent basis.     Other:       PRESEDATION ASSESSMENT: History and Physical has been performed. Patient meds and allergies have been reviewed. Presedation airway examination has been performed and documented. Baseline vital signs, sedation score, oxygenation status, and cardiac rhythm were reviewed. Patient was deemed to be in satisfactory condition to undergo the procedure.    PREMEDICATIONS:   Sedative/Narcotic Amt Dose   Versed 4 mg   Fentanyl 200 mcg  VEC 20 mg             PROCEDURE DETAILS: Timeout performed and correct patient, name, & ID confirmed. Following prep per Pulmonary policy, appropriate sedation was administered. The Bronchoscope was inserted in to oral cavity with bite block in place. Therapeutic aspiration of Tracheobronchial tree was performed.  Airway exam proceeded with findings, technical procedures, and specimen collection as noted below. At the end of exam the scope was withdrawn without incident. Impression and Plan as noted below.      Insertion Route (Place X beside choice below)   Nasal   Oral  x Endotracheal Tube   Tracheostomy     TECHNICAL PROCEDURES: (Place X beside choice below)   Procedures  Description    None     Electrocautery     Cryotherapy     Balloon Dilatation     Bronchography     Stent Placement   x  Therapeutic Aspiration Thick purulent secretions     Laser/Argon Plasma    Brachytherapy Catheter Placement    Foreign Body Removal         SPECIMENS (Sites): (Place X beside choice below)  Specimens Description   No Specimens Obtained     Washings   x Lavage Thick mucoid secretions from left lung   Biopsies    Fine Needle Aspirates    Brushings  Sputum    FINDINGS: thick mucopurulent secretions from left lung, oral thrush noted  ESTIMATED BLOOD LOSS: none COMPLICATIONS/RESOLUTION: none      IMPRESSION:POST-PROCEDURE DX:   Pneumonia with oral thrush  RECOMMENDATION/PLAN:  Await BAL cultures Start Unasyn Start antifungal therapy    Corrin Parker, M.D.  [facility]  Medical Director [place] Director [place] Department

## 2021-04-06 NOTE — Plan of Care (Signed)

## 2021-04-06 NOTE — Procedures (Signed)
Endotracheal Intubation: Patient required placement of an artificial airway secondary to Respiratory Failure  Consent: verbal written from patient  Hand washing performed prior to starting the procedure.   Medications administered for sedation prior to procedure:  Midazolam 4 mg IV,  Vecuronium 20 mg IV, Fentanyl 200 mcg IV.    A time out procedure was called and correct patient, name, & ID confirmed. Needed supplies and equipment were assembled and checked to include ETT, 10 ml syringe, Glidescope, Mac and Miller blades, suction, oxygen and bag mask valve, end tidal CO2 monitor.   Patient was positioned to align the mouth and pharynx to facilitate visualization of the glottis.   Heart rate, SpO2 and blood pressure was continuously monitored during the procedure. Pre-oxygenation was conducted prior to intubation and endotracheal tube was placed through the vocal cords into the trachea.     The artificial airway was placed under direct visualization via glidescope route using a 8.0 ETT on the first attempt.  ETT was secured at 23 cm mark.  Placement was confirmed by auscuitation of lungs with good breath sounds bilaterally and no stomach sounds.  Condensation was noted on endotracheal tube.   Pulse ox 98%.  CO2 detector in place with appropriate color change.   Complications: None .   Operator: Nakya Weyand.   Chest radiograph ordered and pending.    Corrin Parker, M.D.  [facility]  Medical Director [place] Director [facility] Cardio-Pulmonary Department

## 2021-04-06 NOTE — Consult Note (Signed)
Pharmacy Antibiotic Note  Brandon [name] is a 65 y.o. male admitted on 04/03/2021 with  aspiration pneumonia .  Pharmacy has been consulted for Unasyn dosing.  Plan: Unasyn 3g IV Q 6 hours  Height: 6' (182.9 cm) Weight: 78.7 kg (173 lb 8 oz) IBW/kg (Calculated) : 77.6  Temp (24hrs), Avg:98 F (36.7 C), Min:97.5 F (36.4 C), Max:98.8 F (37.1 C)  Recent Labs  Lab 04/03/21 1850 04/04/21 0405 04/05/21 0426 04/05/21 1715 04/06/21 0147  WBC 7.4  7.5 6.2  --   --   --   CREATININE 0.49* 0.43* 0.43* 0.31* 0.46*    Estimated Creatinine Clearance: 101 mL/min (A) (by C-G formula based on SCr of 0.46 mg/dL (L)).    Allergies  Allergen Reactions   Compazine [Prochlorperazine] Other (See Comments)    Dystonic rxn - convulsions. Near fatal reaction.    Ivp Dye [Iodinated Diagnostic Agents] Shortness Of Breath    Pt states SOB after last IV contrast injection   Alcohol-Sulfur [Elemental Sulfur] Other (See Comments)    History of alcoholism   Benadryl [Diphenhydramine] Other (See Comments)    "stuffy", nasal congestion   Depakote [Divalproex Sodium] Other (See Comments)    Cause elevated ammonia   Dramamine [Dimenhydrinate] Swelling   Plavix [Clopidogrel] Other (See Comments)    Rectal bleeding. "Perforated my intestines."   Rosuvastatin     Other reaction(s): Other (See Comments)    Antimicrobials this admission: Unasayn 8/27 >>   Microbiology results: 8/25 MRSA PCR: negative  Thank you for allowing pharmacy to be a part of this patient's care.  Brandon [name] [name] 04/06/2021 1:08 PM

## 2021-04-06 NOTE — Progress Notes (Addendum)
Increased propofol due to pt waking up/agitated. MD called due to hypotension and peripheral levophed started. Propofol stopped and WUA performed, pt able to follow commands. Placed on pressure support by RT. MD ordered extubation.

## 2021-04-07 ENCOUNTER — Inpatient Hospital Stay: Payer: Medicare Other

## 2021-04-07 DIAGNOSIS — F250 Schizoaffective disorder, bipolar type: Secondary | ICD-10-CM | POA: Diagnosis not present

## 2021-04-07 LAB — CBC WITH DIFFERENTIAL/PLATELET
Abs Immature Granulocytes: 0.07 10*3/uL (ref 0.00–0.07)
Basophils Absolute: 0 10*3/uL (ref 0.0–0.1)
Basophils Relative: 0 %
Eosinophils Absolute: 0 10*3/uL (ref 0.0–0.5)
Eosinophils Relative: 0 %
HCT: 36.1 % — ABNORMAL LOW (ref 39.0–52.0)
Hemoglobin: 12.5 g/dL — ABNORMAL LOW (ref 13.0–17.0)
Immature Granulocytes: 1 %
Lymphocytes Relative: 4 %
Lymphs Abs: 0.5 10*3/uL — ABNORMAL LOW (ref 0.7–4.0)
MCH: 29.4 pg (ref 26.0–34.0)
MCHC: 34.6 g/dL (ref 30.0–36.0)
MCV: 84.9 fL (ref 80.0–100.0)
Monocytes Absolute: 0.5 10*3/uL (ref 0.1–1.0)
Monocytes Relative: 4 %
Neutro Abs: 11.7 10*3/uL — ABNORMAL HIGH (ref 1.7–7.7)
Neutrophils Relative %: 91 %
Platelets: 241 10*3/uL (ref 150–400)
RBC: 4.25 MIL/uL (ref 4.22–5.81)
RDW: 13.1 % (ref 11.5–15.5)
WBC: 12.7 10*3/uL — ABNORMAL HIGH (ref 4.0–10.5)
nRBC: 0 % (ref 0.0–0.2)

## 2021-04-07 LAB — BASIC METABOLIC PANEL WITH GFR
Anion gap: 10 (ref 5–15)
BUN: 13 mg/dL (ref 8–23)
CO2: 31 mmol/L (ref 22–32)
Calcium: 8.5 mg/dL — ABNORMAL LOW (ref 8.9–10.3)
Chloride: 84 mmol/L — ABNORMAL LOW (ref 98–111)
Creatinine, Ser: 0.92 mg/dL (ref 0.61–1.24)
GFR, Estimated: >60 mL/min
Glucose, Bld: 125 mg/dL — ABNORMAL HIGH (ref 70–99)
Potassium: 3.8 mmol/L (ref 3.5–5.1)
Sodium: 125 mmol/L — ABNORMAL LOW (ref 135–145)

## 2021-04-07 LAB — PHOSPHORUS: Phosphorus: 5.6 mg/dL — ABNORMAL HIGH (ref 2.5–4.6)

## 2021-04-07 LAB — SODIUM: Sodium: 127 mmol/L — ABNORMAL LOW (ref 135–145)

## 2021-04-07 LAB — MAGNESIUM: Magnesium: 1.7 mg/dL (ref 1.7–2.4)

## 2021-04-07 MED ORDER — DOCUSATE SODIUM 100 MG PO CAPS
100.0000 mg | ORAL_CAPSULE | Freq: Two times a day (BID) | ORAL | Status: DC
  Administered 2021-04-07 – 2021-04-11 (×8): 100 mg via ORAL
  Filled 2021-04-07 (×8): qty 1

## 2021-04-07 MED ORDER — MAGNESIUM SULFATE 2 GM/50ML IV SOLN
2.0000 g | Freq: Once | INTRAVENOUS | Status: AC
  Administered 2021-04-07: 2 g via INTRAVENOUS
  Filled 2021-04-07: qty 50

## 2021-04-07 MED ORDER — BISACODYL 10 MG RE SUPP
10.0000 mg | Freq: Every day | RECTAL | Status: DC | PRN
  Administered 2021-04-07: 10 mg via RECTAL
  Filled 2021-04-07: qty 1

## 2021-04-07 NOTE — Progress Notes (Signed)
On first assessment, pt pupils are unequal. Left is 53mm and brisk, right is a 5 and brisk reaction. No other neuro deficits grips/motor strength. Patient is hyponatremic and has glaucoma. Dr. Arville Care made aware. No new orders received. Will continue to monitor.

## 2021-04-07 NOTE — Progress Notes (Signed)
Vital signs reviewed, ICU needs resolved  Will sign off at this time. No further recommendations at this time.  Please call [phone] for further questions. Thank you.    Quenisha Lovins David Rawlin Reaume, M.D.   Pulmonary & Critical Care Medicine  Medical Director ICU-[facility]  Medical Director [facility] Cardio-Pulmonary Department   

## 2021-04-07 NOTE — Progress Notes (Signed)
PROGRESS NOTE    Brandon [name]  ZOX:[id number] DOB: 07-16-1956 DOA: 04/03/2021 PCP: Jodi Marble, MD   Brief Narrative:  Brandon [name] is a 65 y.o. Caucasian male with medical history significant for chronic hyponatremia, COPD, coronary artery disease status post PCI and stent, hypertension, schizoaffective disorder bipolar type, dyslipidemia, schizophrenia, seizure disorder and anemia, who presented to the ER with acute onset of generalized weakness and fatigue worsening over the last 3 to 4 days - repeat labs show worsening hyponatremia from baseline. He reports that he drinks about 2.5 quarts of water that used to be a gallon in the past, 1 cup of coffee, 2 cups of tea with meals and half a cup of orange juice with breakfast.  He tells me that they wanted to recently increase his clozapine to 200 mg daily at his SNF but he feels more than 100 mg/day is significantly sedative and almost paralyzes him.  Psychiatry consulted for further insight and recommendations on medications.  Assessment & Plan:   Acute hypoxic respiratory failure in the setting of likely aspiration versus mucous plugging -Repeat x-ray shows left-sided whiteout, pulmonology consulted transition to their team for intubation bronchoscopy -Continue supportive care oxygen, Mucomyst, guaifenesin and respiratory PT -Patient successfully intubated bronched and extubated on the 27th, remains on low flow nasal cannula or room air at this point without desaturations or symptoms  -Continue Unasyn for coverage given possible aspiration but less likely given findings   Acute symptomatic on chronic hyponatremia with generalized weakness, POA.   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 - Continue Norvasc, Coreg and  ARB therapy.  Dyslipidemia. -Continue statin  Glaucoma. -Continue Cosopt, Xalatan eyedrops  Coronary artery disease. - We will continue ARB, beta-blocker therapy and as needed sublingual nitroglycerin as well as statin therapy.  BPH. - We will continue Flomax.  Schizoaffective disorder, bipolar type and history of schizophrenia. - We will continue his psychotropic medications per psych -given improvement with tolvaptan could consider increasing dose of clozapine back to therapeutic per psych recommendations  DVT prophylaxis: Lovenox Code Status: Full Family Communication: None present  Status is: Inpatient  Dispo: The patient is from: Home              Anticipated d/c is to: Home              Anticipated d/c date is: 48 to 72 hours pending labs              Patient currently not medically stable for discharge  Consultants:  Psychiatry PCCM  Procedures:  Bronchoscopy 04/06/2021  Antimicrobials:  Unasyn 04/06/2021, ongoing  Subjective: No acute issues or events overnight, extubated successfully yesterday without hypoxia, respiratory status improving otherwise denies nausea vomiting diarrhea constipation headache fevers chills or chest pain  Objective: Vitals:   04/07/21 0500 04/07/21 0530 04/07/21 0600 04/07/21 0645  BP: 97/62 (!) 97/57 100/61 120/73  Pulse: 87 86 83 85  Resp: (!) 23 (!) 26 (!) 29 (!) 21  Temp:      TempSrc:      SpO2: 99% 100% 100% 99%  Weight:      Height:        Intake/Output Summary (Last 24 hours) at 04/07/2021 0811 Last data filed at 04/07/2021 0600 Gross per 24 hour  Intake  1676.67 ml  Output 2110 ml  Net -433.33 ml    Filed Weights   04/03/21 1836 04/06/21 0935  Weight: 82.6 kg 78.7 kg    Examination:  General:  Pleasantly resting in bed, mild distress, tachypneic but nondiaphoretic HEENT:  Normocephalic atraumatic.  Sclerae nonicteric, noninjected.  Extraocular movements intact bilaterally. Neck:  Without mass or deformity.   Trachea is midline. Lungs: Apical left-sided rhonchi without overt wheezes rhonchi or rales otherwise Heart:  Regular rate and rhythm.  Without murmurs, rubs, or gallops. Abdomen:  Soft, nontender, nondistended.  Without guarding or rebound. Extremities: Without cyanosis, clubbing, edema, or obvious deformity. Vascular:  Dorsalis pedis and posterior tibial pulses palpable bilaterally. Skin:  Warm and dry, no erythema, no ulcerations.  Data Reviewed: I have personally reviewed following labs and imaging studies  CBC: Recent Labs  Lab 04/03/21 1850 04/04/21 0405 04/07/21 0202  WBC 7.4  7.5 6.2 12.7*  NEUTROABS 5.5  --  11.7*  HGB 11.9*  12.0* 12.2* 12.5*  HCT 34.6*  34.3* 36.3* 36.1*  MCV 83.2  83.1 82.7 84.9  PLT 278  263 265 863    Basic Metabolic Panel: Recent Labs  Lab 04/03/21 1850 04/04/21 0405 04/05/21 0426 04/05/21 1715 04/05/21 2018 04/06/21 0147 04/06/21 0818 04/06/21 1152 04/06/21 1433 04/06/21 2017 04/07/21 0202 04/07/21 0628  NA 119* 119* 117* 115*   < > 120* 121* 121* 123* 124* 125* 127*  K 3.4* 3.7 3.5 3.5  --  3.6  --   --   --   --  3.8  --   CL 82* 81* 82* 80*  --  82*  --   --   --   --  84*  --   CO2 _0 --  32  --   --   --   --  31  --   GLUCOSE 113* 98 108* 101*  --  111*  --   --   --   --  125*  --   BUN 9 5* 6* 6*  --  5*  --   --   --   --  13  --   CREATININE 0.49* 0.43* 0.43* 0.31*  --  0.46*  --   --   --   --  0.92  --   CALCIUM 8.4* 8.3* 8.3* 7.8*  --  8.7*  --   --   --   --  8.5*  --   MG 1.9  --   --   --   --   --  1.7  --   --   --  1.7  --   PHOS  --   --   --   --   --   --   --   --   --   --  5.6*  --    < > = values in this interval not displayed.    GFR: Estimated Creatinine Clearance: 87.9 mL/min (by C-G formula based on SCr of 0.92 mg/dL). Liver Function Tests: Recent Labs  Lab 04/05/21 1715  AST 14*  ALT 20  ALKPHOS 60  BILITOT 0.7  PROT 5.3*  ALBUMIN 2.9*    No results for input(s): LIPASE,  AMYLASE in the last 168 hours. No results for input(s): AMMONIA in the last 168 hours. Coagulation Profile: No results for input(s): INR, PROTIME in the last 168 hours. Cardiac Enzymes: No results for input(s): CKTOTAL, CKMB, CKMBINDEX, TROPONINI in the  last 168 hours. BNP (last 3 results) No results for input(s): PROBNP in the last 8760 hours. HbA1C: No results for input(s): HGBA1C in the last 72 hours. CBG: Recent Labs  Lab 04/03/21 1946 04/06/21 0638 04/06/21 0939 04/06/21 2015  GLUCAP 110* 112* 105* 128*    Lipid Profile: No results for input(s): CHOL, HDL, LDLCALC, TRIG, CHOLHDL, LDLDIRECT in the last 72 hours. Thyroid Function Tests: No results for input(s): TSH, T4TOTAL, FREET4, T3FREE, THYROIDAB in the last 72 hours. Anemia Panel: No results for input(s): VITAMINB12, FOLATE, FERRITIN, TIBC, IRON, RETICCTPCT in the last 72 hours. Sepsis Labs: No results for input(s): PROCALCITON, LATICACIDVEN in the last 168 hours.  Recent Results (from the past 240 hour(s))  MRSA Next Gen by PCR, Nasal     Status: None   Collection Time: 04/04/21  6:20 AM   Specimen: Nasal Mucosa; Nasal Swab  Result Value Ref Range Status   MRSA by PCR Next Gen NOT DETECTED NOT DETECTED Final    Comment: (NOTE) The GeneXpert MRSA Assay (FDA approved for NASAL specimens only), is one component of a comprehensive MRSA colonization surveillance program. It is not intended to diagnose MRSA infection nor to guide or monitor treatment for MRSA infections. Test performance is not FDA approved in patients less than 29 years old. Performed at [facility], [address] [place], [place] [address]   Resp Panel by RT-PCR (Flu A&B, Covid)     Status: None   Collection Time: 04/06/21  9:24 AM  Result Value Ref Range Status   SARS Coronavirus 2 by RT PCR NEGATIVE NEGATIVE Final    Comment: (NOTE) SARS-CoV-2 target nucleic acids are NOT DETECTED.  The SARS-CoV-2 RNA is generally detectable in  upper respiratory specimens during the acute phase of infection. The lowest concentration of SARS-CoV-2 viral copies this assay can detect is 138 copies/mL. A negative result does not preclude SARS-Cov-2 infection and should not be used as the sole basis for treatment or other patient management decisions. A negative result may occur with  improper specimen collection/handling, submission of specimen other than nasopharyngeal swab, presence of viral mutation(s) within the areas targeted by this assay, and inadequate number of viral copies(<138 copies/mL). A negative result must be combined with clinical observations, patient history, and epidemiological information. The expected result is Negative.  Fact Sheet for Patients:  EntrepreneurPulse.com.au  Fact Sheet for Healthcare Providers:  IncredibleEmployment.be  This test is no t yet approved or cleared by the [place] FDA and  has been authorized for detection and/or diagnosis of SARS-CoV-2 by FDA under an Emergency Use Authorization (EUA). This EUA will remain  in effect (meaning this test can be used) for the duration of the COVID-19 declaration under Section 564(b)(1) of the Act, 21 U.S.C.section 360bbb-3(b)(1), unless the authorization is terminated  or revoked sooner.       Influenza A by PCR NEGATIVE NEGATIVE Final   Influenza B by PCR NEGATIVE NEGATIVE Final    Comment: (NOTE) The Xpert Xpress SARS-CoV-2/FLU/RSV plus assay is intended as an aid in the diagnosis of influenza from Nasopharyngeal swab specimens and should not be used as a sole basis for treatment. Nasal washings and aspirates are unacceptable for Xpert Xpress SARS-CoV-2/FLU/RSV testing.  Fact Sheet for Patients: EntrepreneurPulse.com.au  Fact Sheet for Healthcare Providers: IncredibleEmployment.be  This test is not yet approved or cleared by the [place] FDA and has been  authorized for detection and/or diagnosis of SARS-CoV-2 by FDA under an Emergency Use Authorization (EUA). This  EUA will remain in effect (meaning this test can be used) for the duration of the COVID-19 declaration under Section 564(b)(1) of the Act, 21 U.S.C. section 360bbb-3(b)(1), unless the authorization is terminated or revoked.  Performed at [facility], [address] [place], [place] [address]   Culture, Respiratory w Gram Stain     Status: None (Preliminary result)   Collection Time: 04/06/21 12:58 PM   Specimen: Bronchoalveolar Lavage; Respiratory  Result Value Ref Range Status   Specimen Description   Final    BRONCHIAL ALVEOLAR LAVAGE Performed at [facility], [address], [place], [place] [address]    Special Requests   Final    NONE Performed at [facility], [place], [place] [address]    Gram Stain   Final    FEW SQUAMOUS EPITHELIAL CELLS PRESENT RARE WBC SEEN MODERATE GRAM POSITIVE COCCI Performed at [facility], [place] [address] [place], [place] [address]    Culture PENDING  Incomplete   Report Status PENDING  Incomplete      Radiology Studies: DG Chest 1 View  Result Date: 04/06/2021 CLINICAL DATA:  65 year old male with shortness of breath. EXAM: CHEST  1 VIEW COMPARISON:  CT Chest, Abdomen, and Pelvis 04/05/2021. FINDINGS: Portable AP upright view at 0716 hours. Substantial new opacification and volume loss in the left hemithorax with additional leftward shift of the mediastinum. Confluent left lung base opacity mostly obscuring the diaphragm. Visualized tracheal air column is within normal limits. But possible abrupt termination of the left hilar bronchi. No pneumothorax. Calcified aortic atherosclerosis. Right lung remains clear when allowing for portable technique. Retained contrast in the hepatic flexure mixed with stool. IMPRESSION: 1. Subtotal collapse and/or consolidation of the left lung is new  since yesterday. Consider mucous plugging. 2. Negative right lung. 3.  Aortic Atherosclerosis (ICD10-I70.0). Electronically Signed   By: Genevie Ann M.D.   On: 04/06/2021 07:41   MR BRAIN WO CONTRAST  Result Date: 04/05/2021 CLINICAL DATA:  SIADH of unknown etiology. EXAM: MRI HEAD WITHOUT CONTRAST TECHNIQUE: Multiplanar, multiecho pulse sequences of the brain and surrounding structures were obtained without intravenous contrast. COMPARISON:  Head CT 08/30/2009 FINDINGS: Brain: There is no evidence of an acute infarct, intracranial hemorrhage, mass, midline shift, or extra-axial fluid collection. There is mild cerebral atrophy. Small T2 hyperintensities in the cerebral white matter bilaterally are nonspecific but compatible with mild chronic small vessel ischemic disease. Vascular: Major intracranial vascular flow voids are preserved. Skull and upper cervical spine: Unremarkable bone marrow signal. Sinuses/Orbits: Unremarkable orbits. Large fluid level and mild mucosal thickening in the left maxillary sinus. Clear mastoid air cells. Other: None. IMPRESSION: 1. No acute intracranial abnormality. 2. Mild chronic small vessel ischemic disease and cerebral atrophy. 3. Left maxillary sinusitis. Electronically Signed   By: Logan Bores M.D.   On: 04/05/2021 13:17   DG Chest Port 1 View  Result Date: 04/07/2021 CLINICAL DATA:  Acute respiratory failure. EXAM: PORTABLE CHEST 1 VIEW COMPARISON:  April 06, 2021. FINDINGS: The heart size and mediastinal contours are within normal limits. No pneumothorax is noted. Right lung is clear. Endotracheal and nasogastric tubes have been removed. Stable left basilar atelectasis or infiltrate is noted with associated pleural effusion. Improved left upper lobe pneumonia is noted. The visualized skeletal structures are unremarkable. IMPRESSION: Improved left upper lobe pneumonia. Stable left basilar atelectasis or infiltrate is noted with associated pleural effusion. Electronically  Signed   By: Bobbe Medico.D.  On: 04/07/2021 08:08   Portable Chest x-ray  Result Date: 04/06/2021 CLINICAL DATA:  Check for OG and ET tube placements. EXAM: PORTABLE CHEST - 1 VIEW COMPARISON:  Earlier film of the same day FINDINGS: Endotracheal tube has been placed with its tip approximately 1.6 cm above carina. Nasogastric tube extends into the stomach. Worsening left upper lower lung airspace consolidation. Right lung is relatively clear, the lateral aspect excluded. Heart size normal. Aortic Atherosclerosis (ICD10-170.0). No definite pleural effusion although the right lateral costophrenic angle is excluded. No definite pneumothorax. Visualized bones unremarkable. IMPRESSION: 1. Low position of endotracheal tube, consider retracting 1 or more cm. 2. Worsening left lung airspace disease. Electronically Signed   By: Lucrezia Europe M.D.   On: 04/06/2021 12:25   CT CHEST ABDOMEN PELVIS WO CONTRAST  Result Date: 04/05/2021 CLINICAL DATA:  Syndrome of inappropriate ADH production, unknown etiology. Looking for possible tumors. EXAM: CT CHEST, ABDOMEN AND PELVIS WITHOUT CONTRAST TECHNIQUE: Multidetector CT imaging of the chest, abdomen and pelvis was performed following the standard protocol without IV contrast. COMPARISON:  CT abdomen and pelvis 03/24/2021 FINDINGS: CT CHEST FINDINGS Cardiovascular: Heart is normal in size. Multivessel coronary artery disease. Aortic atherosclerosis. Trace pericardial fluid. Mediastinum/Nodes: No enlarged mediastinal, hilar, or axillary lymph nodes. Thyroid gland, trachea, and esophagus demonstrate no significant findings. Lungs/Pleura: Patchy airspace opacity spanning 2 cm in the anterior left upper lobe is new compared to radiograph 03/22/2021 (series 4, image 60). There are several adjacent areas of ground-glass opacity within the anterior left upper lobe (series 4, image 58, 78). An additional 1.3 cm patchy airspace opacity is noted in the subpleural left lower lobe  (series 4, image 85). Dependent atelectasis in the lung bases. No pleural effusion or pneumothorax. Musculoskeletal: Old healed left lateral fourth through seventh rib fractures. No acute osseous abnormality. No suspicious osseous lesion. CT ABDOMEN PELVIS FINDINGS Hepatobiliary: No focal liver abnormality is seen. Status post cholecystectomy with small amount of residual fluid at the gallbladder fossa versus gallbladder remnant. No biliary dilatation. Pancreas: No pancreatic ductal dilatation or surrounding inflammatory changes. Spleen: Normal in size without focal abnormality. Adrenals/Urinary Tract: Adrenal glands are unremarkable. Multiple bilateral exophytic renal cysts measuring up to 4.2 cm at the lower pole of the right kidney. Stable 0.8 cm probable hemorrhagic cyst at the posterior aspect of the lower pole of the left kidney (series 2, image 74). No renal calculi. No hydronephrosis. Dependent hyperdensity within the urinary bladder (series 2, image 116). Resolution of the urinary bladder wall thickening noted on most recent exam. Stomach/Bowel: Stomach is within normal limits. No evidence of bowel wall thickening, distention, or inflammatory changes. Oral contrast reaches the proximal transverse colon. Diverticulosis of the descending and sigmoid colon. Vascular/Lymphatic: Aortic calcific atherosclerosis. No abdominal aortic aneurysm. Diffuse marked calcific atherosclerosis in the comment and external iliac arteries as well as the right common femoral artery. No enlarged lymph nodes in the abdomen or pelvis. Reproductive: Prostate is unremarkable. Other: 1.9 cm fat containing left inguinal hernia. No abdominopelvic ascites. Musculoskeletal: Multilevel degenerative disc disease throughout the lumbar spine, most severe at L4-L5 and L5-S1 with vacuum disc phenomenon. No suspicious osseous lesion. IMPRESSION: 1. No findings suggest malignancy on this unenhanced exam. 2. Interval development of patchy airspace  opacities in the left upper and lower lobes since 03/22/2021, concerning for multifocal pneumonia. Given concern for possible malignancy causing SIADH, recommend follow-up radiograph in 6-8 weeks to evaluate for resolution. 3. Dependently located layering hyperdensity within the urinary bladder may represent hemorrhage  or possibly tiny layering bladder stones. Recommend correlation with urinalysis for hematuria. 4. Remainder of exam is stable compared to 03/24/2021. Electronically Signed   By: Ileana Roup M.D.   On: 04/05/2021 18:44    Scheduled Meds:  amLODipine  5 mg Oral Daily   atorvastatin  20 mg Oral QHS   budesonide (PULMICORT) nebulizer solution  0.5 mg Nebulization BID   buPROPion ER  100 mg Oral Daily   carvedilol  25 mg Oral BID WC   chlorhexidine gluconate (MEDLINE KIT)  15 mL Mouth Rinse BID   Chlorhexidine Gluconate Cloth  6 each Topical Q0600   cholecalciferol  1,000 Units Oral Daily   cloZAPine  50 mg Oral QHS   docusate  100 mg Per Tube BID   dorzolamide-timolol  1 drop Both Eyes BID   enoxaparin (LOVENOX) injection  40 mg Subcutaneous Q24H   ezetimibe  10 mg Oral QHS   ipratropium-albuterol  3 mL Nebulization Q4H   irbesartan  37.5 mg Oral Daily   isosorbide mononitrate  30 mg Oral BID   latanoprost  1 drop Both Eyes QHS   mouth rinse  15 mL Mouth Rinse q12n4p   methylPREDNISolone (SOLU-MEDROL) injection  20 mg Intravenous Q12H   pantoprazole  40 mg Oral BID   polyethylene glycol  17 g Per Tube Daily   sodium chloride flush  10-40 mL Intracatheter Q12H   sodium chloride  1 g Oral TID WC   tamsulosin  0.4 mg Oral Daily   temazepam  7.5 mg Oral QHS   Continuous Infusions:  sodium chloride 10 mL/hr at 04/07/21 0600   ampicillin-sulbactam (UNASYN) IV Stopped (04/07/21 0155)   famotidine (PEPCID) IV Stopped (04/06/21 1434)   fluconazole (DIFLUCAN) IV     magnesium sulfate bolus IVPB     norepinephrine (LEVOPHED) Adult infusion Stopped (04/07/21 0037)      LOS: 4  days   Time spent: 38mn  Chanin Frumkin C Kahealani Yankovich, DO [facility]  If 7PM-7AM, please contact night-coverage [email or web address]  04/07/2021, 8:11 AM

## 2021-04-07 NOTE — Progress Notes (Signed)
Patient has persistent coarse crackles throughout lung fields, greater on the left side. O2 sats 100% on 6L. CXR completed. Camie Patience, NP made aware.

## 2021-04-07 NOTE — Progress Notes (Signed)
   04/07/21 1620  Clinical Encounter Type  Visited With Patient  Visit Type Initial;Spiritual support;Social support  Referral From Nurse  Consult/Referral To Chaplain  Spiritual Encounters  Spiritual Needs Sacred text (requested large print Bible)  Chaplain Burris responded to a request that Pt desires a large print Bible. This is not available at this time but chaplain went to visit with Pt and offer support. Chaplain Burris provided listening and compassionate presence as well as offer to provide some key texts that Pt could use for meditation and to guide his prayers (Pt is Catholic). As chaplain prepared to leave one of the local priests arrived for additional spiritual support. Will recommend follow-up visitation.

## 2021-04-07 NOTE — Progress Notes (Signed)
Patient tidal volumes less than 100 and bipap alarming. Patient arousable. Placed on 6L [place], sats 100%. Will continue to monitor.

## 2021-04-08 DIAGNOSIS — F250 Schizoaffective disorder, bipolar type: Secondary | ICD-10-CM | POA: Diagnosis not present

## 2021-04-08 LAB — BASIC METABOLIC PANEL
Anion gap: 6 (ref 5–15)
BUN: 12 mg/dL (ref 8–23)
CO2: 34 mmol/L — ABNORMAL HIGH (ref 22–32)
Calcium: 8.6 mg/dL — ABNORMAL LOW (ref 8.9–10.3)
Chloride: 87 mmol/L — ABNORMAL LOW (ref 98–111)
Creatinine, Ser: 0.65 mg/dL (ref 0.61–1.24)
GFR, Estimated: >60 mL/min (ref >60)
Glucose, Bld: 127 mg/dL — ABNORMAL HIGH (ref 70–99)
Potassium: 3.6 mmol/L (ref 3.5–5.1)
Sodium: 127 mmol/L — ABNORMAL LOW (ref 135–145)

## 2021-04-08 LAB — CBC WITH DIFFERENTIAL/PLATELET
Abs Immature Granulocytes: 0.07 10*3/uL (ref 0.00–0.07)
Basophils Absolute: 0 10*3/uL (ref 0.0–0.1)
Basophils Relative: 0 %
Eosinophils Absolute: 0 10*3/uL (ref 0.0–0.5)
Eosinophils Relative: 0 %
HCT: 35.2 % — ABNORMAL LOW (ref 39.0–52.0)
Hemoglobin: 12 g/dL — ABNORMAL LOW (ref 13.0–17.0)
Immature Granulocytes: 1 %
Lymphocytes Relative: 4 %
Lymphs Abs: 0.4 10*3/uL — ABNORMAL LOW (ref 0.7–4.0)
MCH: 29.6 pg (ref 26.0–34.0)
MCHC: 34.1 g/dL (ref 30.0–36.0)
MCV: 86.7 fL (ref 80.0–100.0)
Monocytes Absolute: 0.5 10*3/uL (ref 0.1–1.0)
Monocytes Relative: 5 %
Neutro Abs: 9 10*3/uL — ABNORMAL HIGH (ref 1.7–7.7)
Neutrophils Relative %: 90 %
Platelets: 252 10*3/uL (ref 150–400)
RBC: 4.06 MIL/uL — ABNORMAL LOW (ref 4.22–5.81)
RDW: 13.2 % (ref 11.5–15.5)
WBC: 9.9 10*3/uL (ref 4.0–10.5)
nRBC: 0 % (ref 0.0–0.2)

## 2021-04-08 LAB — PHOSPHORUS: Phosphorus: 2.7 mg/dL (ref 2.5–4.6)

## 2021-04-08 MED ORDER — FAMOTIDINE 20 MG IN NS 100 ML IVPB: 20.0000 mg | Freq: Every day | INTRAVENOUS | Status: DC

## 2021-04-08 MED ORDER — FAMOTIDINE 20 MG IN NS 100 ML IVPB
20.0000 mg | INTRAVENOUS | Status: DC
  Administered 2021-04-09: 20 mg via INTRAVENOUS
  Filled 2021-04-08 (×3): qty 100

## 2021-04-08 MED ORDER — IPRATROPIUM-ALBUTEROL 0.5-2.5 (3) MG/3ML IN SOLN
3.0000 mL | Freq: Four times a day (QID) | RESPIRATORY_TRACT | Status: DC
  Administered 2021-04-08 – 2021-04-09 (×4): 3 mL via RESPIRATORY_TRACT
  Filled 2021-04-08 (×3): qty 3

## 2021-04-08 MED ORDER — FAMOTIDINE 20 MG IN NS 100 ML IVPB
20.0000 mg | Freq: Two times a day (BID) | INTRAVENOUS | Status: DC
  Filled 2021-04-08: qty 100

## 2021-04-08 MED ORDER — FAMOTIDINE IN NACL 20-0.9 MG/50ML-% IV SOLN: 20.0000 mg | INTRAVENOUS | Status: DC

## 2021-04-08 MED ORDER — FAMOTIDINE 20 MG IN NS 100 ML IVPB
20.0000 mg | Freq: Every day | INTRAVENOUS | Status: DC
  Administered 2021-04-08: 20 mg via INTRAVENOUS
  Filled 2021-04-08 (×2): qty 100

## 2021-04-08 NOTE — Progress Notes (Signed)
   04/06/21 0636  Assess: MEWS Score  Temp 98.2 F (36.8 C)  BP 93/65  Pulse Rate 83  Resp (!) 30  SpO2 97 %  Assess: MEWS Score  MEWS Temp 0  MEWS Systolic 1  MEWS Pulse 0  MEWS RR 2  MEWS LOC 1  MEWS Score 4  MEWS Score Color Red  Assess: if the MEWS score is Yellow or Red  Were vital signs taken at a resting state? Yes  Focused Assessment No change from prior assessment  Does the patient meet 2 or more of the SIRS criteria? Yes  Does the patient have a confirmed or suspected source of infection? No  MEWS guidelines implemented *See Row Information* Yes  Treat  MEWS Interventions Escalated (See documentation below)  Pain Scale 0-10  Pain Score 0  Take Vital Signs  Increase Vital Sign Frequency  Red: Q 1hr X 4 then Q 4hr X 4, if remains red, continue Q 4hrs  Escalate  MEWS: Escalate Red: discuss with charge nurse/RN and provider, consider discussing with RRT  Notify: Charge Nurse/RN  Name of Charge Nurse/RN Notified Linnise, RN  Date Charge Nurse/RN Notified 04/06/21  Time Charge Nurse/RN Notified [id number]  Notify: Provider  Provider Name/Title Arville Care, MD  Date Provider Notified 04/06/21  Time Provider Notified [phone]  Notification Type  (chat)  Notification Reason Change in status  Provider response See new orders  Date of Provider Response 04/06/21  Time of Provider Response [phone]  Notify: Rapid Response  Name of Rapid Response RN Notified Alcario Drought, RN  Date Rapid Response Notified 04/06/21  Time Rapid Response Notified 0641  Document  Patient Outcome Other (Comment) (Pt to remain in unit at this time)  Assess: SIRS CRITERIA  SIRS Temperature  0  SIRS Pulse 0  SIRS Respirations  1  SIRS WBC 0  SIRS Score Sum  1  Inserted for [facility] RN

## 2021-04-08 NOTE — Plan of Care (Signed)
Patient resting comfortably in bed. Patient remains on room air. No complications throughout this shift. Vital signs remain stable.   Problem: Clinical Measurements: Goal: Respiratory complications will improve Outcome: Progressing Goal: Cardiovascular complication will be avoided Outcome: Progressing   Problem: Activity: Goal: Risk for activity intolerance will decrease Outcome: Progressing   Problem: Nutrition: Goal: Adequate nutrition will be maintained Outcome: Progressing   Problem: Coping: Goal: Level of anxiety will decrease Outcome: Progressing   Problem: Elimination: Goal: Will not experience complications related to bowel motility Outcome: Progressing

## 2021-04-08 NOTE — Consult Note (Signed)
Pharmacy - Clozapine     This patient's order has been reviewed for prescribing contraindications.   Clozapine REMS enrollment Verified: yes  Home Regimen:  Clozapine 150 mg qHS Missed doses: no   Inpatient regimen ordered:  Clozapine 50 mg qHS  Dose Adjustments This Admission:   Labs:   8/24 ANC = 5500 8/29 ACN = 9000   Plan: Continue with ordered reduced dose of clozapine 50 mg q HS Continue with weekly ANC labs while inpatient  **The medication is being dispensed pursuant to the FDA REMS suspension order of 06/29/20 that allows for dispensing without a patient REMS dispense authorization (RDA).    Burnis Medin, PharmD, BCPS Clinical Pharmacist

## 2021-04-08 NOTE — [place] Notes (Signed)
[place]GRESS NOTE    Brandon [name]  YSA:[id number] DOB: 1955-09-05 DOA: 04/03/2021 PCP: Jodi Marble, MD   Brief Narrative:  Brandon [name] is a 65 y.o. Caucasian male with medical history significant for chronic hyponatremia, COPD, coronary artery disease status post PCI and stent, hypertension, schizoaffective disorder bipolar type, dyslipidemia, schizophrenia, seizure disorder and anemia, who presented to the ER with acute onset of generalized weakness and fatigue worsening over the last 3 to 4 days - repeat labs show worsening hyponatremia from baseline. He reports that he drinks about 2.5 quarts of water that used to be a gallon in the past, 1 cup of coffee, 2 cups of tea with meals and half a cup of orange juice with breakfast.  He tells me that they wanted to recently increase his clozapine to 200 mg daily at his SNF but he feels more than 100 mg/day is significantly sedative and almost paralyzes him.  Psychiatry consulted for further insight and recommendations on medications.  Assessment & Plan:   Acute hypoxic respiratory failure in the setting of likely aspiration versus mucous plugging, resolving - Continue supportive care oxygen, Mucomyst, guaifenesin and respiratory PT - Patient successfully intubated bronched and extubated on the 27th, remains on room air at this point without desaturations or symptoms  - Continue Unasyn for coverage given possible aspiration   Acute symptomatic on chronic hyponatremia with generalized weakness, POA.   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 - Continue Norvasc, Coreg and ARB therapy.  Dyslipidemia. - Continue statin  Glaucoma. - Continue Cosopt, Xalatan eyedrops  Coronary artery disease. - We will continue ARB,  beta-blocker therapy and as needed sublingual nitroglycerin as well as statin therapy.  BPH. - Continue Flomax.  Schizoaffective disorder, bipolar type and history of schizophrenia. - We will continue his psychotropic medications per psych -given improvement with tolvaptan could consider increasing dose of clozapine back to therapeutic per psych recommendations  DVT prophylaxis: Lovenox Code Status: Full Family Communication: None present  Status is: Inpatient  Dispo: The patient is from: Home              Anticipated d/c is to: Home              Anticipated d/c date is: 48 to 72 hours pending labs              Patient currently not medically stable for discharge  Consultants:  Psychiatry PCCM  Procedures:  Bronchoscopy 04/06/2021  Antimicrobials:  Unasyn 04/06/2021, ongoing  Subjective: No acute issues or events overnight, respiratory status improving otherwise denies nausea vomiting diarrhea constipation headache fevers chills or chest pain  Objective: Vitals:   04/08/21 0530 04/08/21 0600 04/08/21 0700 04/08/21 0744  BP: 124/83 115/63 127/87   Pulse: 89 87 90   Resp: (!) 27 (!) 21 (!) 29   Temp:      TempSrc:      SpO2: 93% 98% 96% 99%  Weight:      Height:        Intake/Output Summary (Last 24 hours) at 04/08/2021 0812 Last data filed at 04/08/2021 0600 Gross per 24 hour  Intake 655.1 ml  Output 2445 ml  Net -1789.9 ml    Filed Weights   04/03/21 1836 04/06/21 0935  Weight: 82.6 kg 78.7  kg    Examination:  General:  Pleasantly resting in bed, mild distress, tachypneic but nondiaphoretic HEENT:  Normocephalic atraumatic.  Sclerae nonicteric, noninjected.  Extraocular movements intact bilaterally. Neck:  Without mass or deformity.  Trachea is midline. Lungs: Apical left-sided rhonchi without overt wheezes rhonchi or rales otherwise Heart:  Regular rate and rhythm.  Without murmurs, rubs, or gallops. Abdomen:  Soft, nontender, nondistended.  Without  guarding or rebound. Extremities: Without cyanosis, clubbing, edema, or obvious deformity. Vascular:  Dorsalis pedis and posterior tibial pulses palpable bilaterally. Skin:  Warm and dry, no erythema, no ulcerations.  Data Reviewed: I have personally reviewed following labs and imaging studies  CBC: Recent Labs  Lab 04/03/21 1850 04/04/21 0405 04/07/21 0202 04/08/21 0620  WBC 7.4  7.5 6.2 12.7* 9.9  NEUTROABS 5.5  --  11.7* 9.0*  HGB 11.9*  12.0* 12.2* 12.5* 12.0*  HCT 34.6*  34.3* 36.3* 36.1* 35.2*  MCV 83.2  83.1 82.7 84.9 86.7  PLT 278  263 265 241 562    Basic Metabolic Panel: Recent Labs  Lab 04/03/21 1850 04/04/21 0405 04/05/21 0426 04/05/21 1715 04/05/21 2018 04/06/21 0147 04/06/21 0818 04/06/21 1152 04/06/21 1433 04/06/21 2017 04/07/21 0202 04/07/21 0628 04/08/21 0620  NA 119*   < > 117* 115*   < > 120* 121*   < > 123* 124* 125* 127* 127*  K 3.4*   < > 3.5 3.5  --  3.6  --   --   --   --  3.8  --  3.6  CL 82*   < > 82* 80*  --  82*  --   --   --   --  84*  --  87*  CO2 30   < > 29 28  --  32  --   --   --   --  31  --  34*  GLUCOSE 113*   < > 108* 101*  --  111*  --   --   --   --  125*  --  127*  BUN 9   < > 6* 6*  --  5*  --   --   --   --  13  --  12  CREATININE 0.49*   < > 0.43* 0.31*  --  0.46*  --   --   --   --  0.92  --  0.65  CALCIUM 8.4*   < > 8.3* 7.8*  --  8.7*  --   --   --   --  8.5*  --  8.6*  MG 1.9  --   --   --   --   --  1.7  --   --   --  1.7  --   --   PHOS  --   --   --   --   --   --   --   --   --   --  5.6*  --  2.7   < > = values in this interval not displayed.    GFR: Estimated Creatinine Clearance: 101 mL/min (by C-G formula based on SCr of 0.65 mg/dL). Liver Function Tests: Recent Labs  Lab 04/05/21 1715  AST 14*  ALT 20  ALKPHOS 60  BILITOT 0.7  PROT 5.3*  ALBUMIN 2.9*    No results for input(s): LIPASE, AMYLASE in the last 168 hours. No results for input(s): AMMONIA in the last 168 hours. Coagulation  Profile:  No results for input(s): INR, PROTIME in the last 168 hours. Cardiac Enzymes: No results for input(s): CKTOTAL, CKMB, CKMBINDEX, TROPONINI in the last 168 hours. BNP (last 3 results) No results for input(s): PROBNP in the last 8760 hours. HbA1C: No results for input(s): HGBA1C in the last 72 hours. CBG: Recent Labs  Lab 04/03/21 1946 04/06/21 0638 04/06/21 0939 04/06/21 2015  GLUCAP 110* 112* 105* 128*    Lipid Profile: No results for input(s): CHOL, HDL, LDLCALC, TRIG, CHOLHDL, LDLDIRECT in the last 72 hours. Thyroid Function Tests: No results for input(s): TSH, T4TOTAL, FREET4, T3FREE, THYROIDAB in the last 72 hours. Anemia Panel: No results for input(s): VITAMINB12, FOLATE, FERRITIN, TIBC, IRON, RETICCTPCT in the last 72 hours. Sepsis Labs: No results for input(s): PROCALCITON, LATICACIDVEN in the last 168 hours.  Recent Results (from the past 240 hour(s))  MRSA Next Gen by PCR, Nasal     Status: None   Collection Time: 04/04/21  6:20 AM   Specimen: Nasal Mucosa; Nasal Swab  Result Value Ref Range Status   MRSA by PCR Next Gen NOT DETECTED NOT DETECTED Final    Comment: (NOTE) The GeneXpert MRSA Assay (FDA approved for NASAL specimens only), is one component of a comprehensive MRSA colonization surveillance program. It is not intended to diagnose MRSA infection nor to guide or monitor treatment for MRSA infections. Test performance is not FDA approved in patients less than 65 years old. Performed at [facility], [address] [place], [place] [address]   Resp Panel by RT-PCR (Flu A&B, Covid)     Status: None   Collection Time: 04/06/21  9:24 AM  Result Value Ref Range Status   SARS Coronavirus 2 by RT PCR NEGATIVE NEGATIVE Final    Comment: (NOTE) SARS-CoV-2 target nucleic acids are NOT DETECTED.  The SARS-CoV-2 RNA is generally detectable in upper respiratory specimens during the acute phase of infection. The lowest concentration of  SARS-CoV-2 viral copies this assay can detect is 138 copies/mL. A negative result does not preclude SARS-Cov-2 infection and should not be used as the sole basis for treatment or other patient management decisions. A negative result may occur with  improper specimen collection/handling, submission of specimen other than nasopharyngeal swab, presence of viral mutation(s) within the areas targeted by this assay, and inadequate number of viral copies(<138 copies/mL). A negative result must be combined with clinical observations, patient history, and epidemiological information. The expected result is Negative.  Fact Sheet for Patients:  EntrepreneurPulse.com.au  Fact Sheet for Healthcare Providers:  IncredibleEmployment.be  This test is no t yet approved or cleared by the [place] FDA and  has been authorized for detection and/or diagnosis of SARS-CoV-2 by FDA under an Emergency Use Authorization (EUA). This EUA will remain  in effect (meaning this test can be used) for the duration of the COVID-19 declaration under Section 564(b)(1) of the Act, 21 U.S.C.section 360bbb-3(b)(1), unless the authorization is terminated  or revoked sooner.       Influenza A by PCR NEGATIVE NEGATIVE Final   Influenza B by PCR NEGATIVE NEGATIVE Final    Comment: (NOTE) The Xpert Xpress SARS-CoV-2/FLU/RSV plus assay is intended as an aid in the diagnosis of influenza from Nasopharyngeal swab specimens and should not be used as a sole basis for treatment. Nasal washings and aspirates are unacceptable for Xpert Xpress SARS-CoV-2/FLU/RSV testing.  Fact Sheet for Patients: EntrepreneurPulse.com.au  Fact Sheet for Healthcare Providers: IncredibleEmployment.be  This test is not yet approved or cleared by the  [place] [employer]ural [employer] and has been authorized for detection and/or diagnosis of SARS-CoV-2 by FDA under an Patent examiner (EUA). This EUA will remain in effect (meaning this test can be used) for the duration of the COVID-19 declaration under Section 564(b)(1) of the Act, 21 U.S.C. section 360bbb-3(b)(1), unless the authorization is terminated or revoked.  Performed at [facility], [address] [place], [place] [address]   Culture, Respiratory w Gram Stain     Status: None (Preliminary result)   Collection Time: 04/06/21 12:58 PM   Specimen: Bronchoalveolar Lavage; Respiratory  Result Value Ref Range Status   Specimen Description   Final    BRONCHIAL ALVEOLAR LAVAGE Performed at [facility], [address], [place], [place] [address]    Special Requests   Final    NONE Performed at [facility], [place], [place] [address]    Gram Stain   Final    FEW SQUAMOUS EPITHELIAL CELLS PRESENT RARE WBC SEEN MODERATE GRAM POSITIVE COCCI    Culture   Final    CULTURE REINCUBATED FOR BETTER GROWTH Performed at [facility], [place] [address] [place], [place] [address]    Report Status PENDING  Incomplete      Radiology Studies: DG Chest Port 1 View  Result Date: 04/07/2021 CLINICAL DATA:  Acute respiratory failure. EXAM: PORTABLE CHEST 1 VIEW COMPARISON:  April 06, 2021. FINDINGS: The heart size and mediastinal contours are within normal limits. No pneumothorax is noted. Right lung is clear. Endotracheal and nasogastric tubes have been removed. Stable left basilar atelectasis or infiltrate is noted with associated pleural effusion. Improved left upper lobe pneumonia is noted. The visualized skeletal structures are unremarkable. IMPRESSION: Improved left upper lobe pneumonia. Stable left basilar atelectasis or infiltrate is noted with associated pleural effusion. Electronically Signed   By: Marijo Conception M.D.   On: 04/07/2021 08:08   Portable Chest x-ray  Result Date: 04/06/2021 CLINICAL DATA:  Check for OG and ET tube placements. EXAM:  PORTABLE CHEST - 1 VIEW COMPARISON:  Earlier film of the same day FINDINGS: Endotracheal tube has been placed with its tip approximately 1.6 cm above carina. Nasogastric tube extends into the stomach. Worsening left upper lower lung airspace consolidation. Right lung is relatively clear, the lateral aspect excluded. Heart size normal. Aortic Atherosclerosis (ICD10-170.0). No definite pleural effusion although the right lateral costophrenic angle is excluded. No definite pneumothorax. Visualized bones unremarkable. IMPRESSION: 1. Low position of endotracheal tube, consider retracting 1 or more cm. 2. Worsening left lung airspace disease. Electronically Signed   By: Lucrezia Europe M.D.   On: 04/06/2021 12:25    Scheduled Meds:  amLODipine  5 mg Oral Daily   atorvastatin  20 mg Oral QHS   budesonide (PULMICORT) nebulizer solution  0.5 mg Nebulization BID   buPROPion ER  100 mg Oral Daily   carvedilol  25 mg Oral BID WC   Chlorhexidine Gluconate Cloth  6 each Topical Q0600   cholecalciferol  1,000 Units Oral Daily   cloZAPine  50 mg Oral QHS   docusate sodium  100 mg Oral BID   dorzolamide-timolol  1 drop Both Eyes BID   enoxaparin (LOVENOX) injection  40 mg Subcutaneous Q24H   ezetimibe  10 mg Oral QHS   ipratropium-albuterol  3 mL Nebulization Q4H   irbesartan  37.5 mg Oral Daily   isosorbide mononitrate  30 mg Oral BID   latanoprost  1 drop Both Eyes  QHS   mouth rinse  15 mL Mouth Rinse q12n4p   methylPREDNISolone (SOLU-MEDROL) injection  20 mg Intravenous Q12H   pantoprazole  40 mg Oral BID   polyethylene glycol  17 g Per Tube Daily   sodium chloride flush  10-40 mL Intracatheter Q12H   sodium chloride  1 g Oral TID WC   tamsulosin  0.4 mg Oral Daily   temazepam  7.5 mg Oral QHS   Continuous Infusions:  sodium chloride 10 mL/hr at 04/08/21 0600   ampicillin-sulbactam (UNASYN) IV 3 g (04/08/21 0802)   famotidine (PEPCID) IV Stopped (04/07/21 1206)   fluconazole (DIFLUCAN) IV Stopped  (04/07/21 2023)   norepinephrine (LEVOPHED) Adult infusion Stopped (04/07/21 0037)      LOS: 5 days   Time spent: 42mn  Keirsten Matuska C Keevan Wolz, DO [facility]  If 7PM-7AM, please contact night-coverage [email or web address]  04/08/2021, 8:12 AM

## 2021-04-08 NOTE — Consult Note (Signed)
[name] Face-to-Face Psychiatry Consult   Reason for Consult: Follow-up with this 65 year old man history of schizophrenia.  Patient had low sodiums.  [name] Referring Physician: Natale [name] Patient Identification: [name] MRN:  [id number] Principal Diagnosis: Schizoaffective disorder, bipolar type (HCC) Diagnosis:  Principal Problem:   Schizoaffective disorder, bipolar type (HCC) Active Problems:   Hyponatremia   Lobar pneumonia, unspecified organism (HCC)   Total Time spent with patient: 30 minutes  Subjective:   [name] is a 65 y.o. male patient admitted for medical issues.    HPI: 65 yo male sitting up in bed with pleasant demeanor, smiling throughout the assessment.  Dr. Toni [name] started Clozaril while he was in the hospital.  He denies side effects along with depression, anxiety, withdrawal symptoms, psychosis, and suicidal/homicidal ideations.  His sleep is "good" and appetite is improving, still below his norm.  No concerns at this time, psychiatrically stable.  He reports his outpatient providers are with [name] in [place] where he will continue his care.  Past Psychiatric History: Has been developing more and more hyponatremia over the past couple years.  History of schizophrenia.  Had been clinically stable for the most part on clozapine  Risk to Self:  none Risk to Others:  none Prior Inpatient Therapy:  multiple Prior Outpatient Therapy:  [name]  Past Medical History:  Past Medical History:  Diagnosis Date   Anemia    IDA   Colon polyps    COPD (chronic obstructive pulmonary disease) (HCC)    Coronary artery disease    Heart attack (HCC) 2005   Hiatal hernia    History of ETOH abuse    Hyperlipidemia    Hypertension    Pleurisy    Schizophrenia (HCC)    Schizophrenia (HCC)    Seizures (HCC)    grand mal in 2000 or 2001 recovery from alcoholism   Suicide attempt [name])     Past Surgical History:  Procedure Laterality Date    ABDOMINAL SURGERY     internal bleeding   CHOLECYSTECTOMY N/A 05/04/2018   Procedure: LAPAROSCOPIC CHOLECYSTECTOMY, converted to open;  Surgeon: Brandon Amabile, DO;  Location: [name];  Service: General;  Laterality: N/A;   COLONOSCOPY     COLONOSCOPY WITH PROPOFOL N/A 05/09/2016   Procedure: COLONOSCOPY WITH PROPOFOL;  Surgeon: Brandon Jun, MD;  [facility];  Service: Endoscopy;  Laterality: N/A;   CORONARY ANGIOPLASTY WITH STENT PLACEMENT     1 vessel   ESOPHAGOGASTRODUODENOSCOPY (EGD) WITH PROPOFOL N/A 05/09/2016   Procedure: ESOPHAGOGASTRODUODENOSCOPY (EGD) WITH PROPOFOL;  Surgeon: Brandon Jun, MD;  [name];  Service: Endoscopy;  Laterality: N/A;   EYE SURGERY Right    GLAUCOMA SURGERY     LAPAROSCOPIC APPENDECTOMY N/A 07/17/2018   Procedure: APPENDECTOMY LAPAROSCOPIC;  Surgeon: Brandon Amabile, DO;  Location: [name];  Service: General;  Laterality: N/A;   LEFT HEART CATH Right 10/19/2017   Procedure: Left Heart Cath and Coronary Angiography;  Surgeon: Brandon Nancy, MD;  Location: [name];  Service: Cardiovascular;  Laterality: Right;   NOSE SURGERY     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   VASECTOMY     Family History:  Family History  Problem Relation Age of Onset   Lung cancer Mother    Hypertension Father    Heart attack Father    CAD Father    Prostate cancer Neg Hx    Bladder Cancer Neg Hx    Kidney cancer  Neg Hx    Family Psychiatric  History: See previous Social History:  Social History   Substance and Sexual Activity  Alcohol Use No   Comment: no alcohol since 2010     Social History   Substance and Sexual Activity  Drug Use No    Social History   Socioeconomic History   Marital status: Married    Spouse name: Not on file   Number of children: Not on file   Years of education: Not on file   Highest education level: Not on file  Occupational History   Not on file  Tobacco Use   Smoking status: Former     Packs/day: 1.00    Types: Cigarettes    Quit date: 2012    Years since quitting: 10.6   Smokeless tobacco: Former  Building services engineer Use: Never used  Substance and Sexual Activity   Alcohol use: No    Comment: no alcohol since 2010   Drug use: No   Sexual activity: Not on file  Other Topics Concern   Not on file  Social History Narrative   ** Merged History Encounter **       Social Determinants of Health   Financial Resource Strain: Not on file  Food Insecurity: Not on file  Transportation Needs: Not on file  Physical Activity: Not on file  Stress: Not on file  Social Connections: Not on file   Additional Social History:    Allergies:   Allergies  Allergen Reactions   Compazine [Prochlorperazine] Other (See Comments)    Dystonic rxn - convulsions. Near fatal reaction.    Ivp Dye [Iodinated Diagnostic Agents] Shortness Of Breath    Pt states SOB after last IV contrast injection   Alcohol-Sulfur [Elemental Sulfur] Other (See Comments)    History of alcoholism   Benadryl [Diphenhydramine] Other (See Comments)    "stuffy", nasal congestion   Depakote [Divalproex Sodium] Other (See Comments)    Cause elevated ammonia   Dramamine [Dimenhydrinate] Swelling   Plavix [Clopidogrel] Other (See Comments)    Rectal bleeding. "Perforated my intestines."   Rosuvastatin     Other reaction(s): Other (See Comments)    Labs:  Results for orders placed or performed during the hospital encounter of 04/03/21 (from the past 48 hour(s))  Glucose, capillary     Status: Abnormal   Collection Time: 04/06/21  8:15 PM  Result Value Ref Range   Glucose-Capillary 128 (H) 70 - 99 mg/dL    Comment: Glucose reference range applies only to samples taken after fasting for at least 8 hours.  sodium     Status: Abnormal   Collection Time: 04/06/21  8:17 PM  Result Value Ref Range   Sodium 124 (L) 135 - 145 mmol/L    Comment: Performed at [facility], [address]  [place], [place] [address]  Basic metabolic panel     Status: Abnormal   Collection Time: 04/07/21  2:02 AM  Result Value Ref Range   Sodium 125 (L) 135 - 145 mmol/L   Potassium 3.8 3.5 - 5.1 mmol/L   Chloride 84 (L) 98 - 111 mmol/L   CO2 31 22 - 32 mmol/L   Glucose, Bld 125 (H) 70 - 99 mg/dL    Comment: Glucose reference range applies only to samples taken after fasting for at least 8 hours.   BUN 13 8 - 23 mg/dL   Creatinine, Ser 7.34 0.61 - 1.24 mg/dL   Calcium 8.5 (L)  8.9 - 10.3 mg/dL   GFR, Estimated >00 >92 mL/min    Comment: (NOTE) Calculated using the CKD-EPI Creatinine Equation (2021)    Anion gap 10 5 - 15    Comment: Performed at [name], [address] [place], [place] [address]  CBC with Differential/Platelet     Status: Abnormal   Collection Time: 04/07/21  2:02 AM  Result Value Ref Range   WBC 12.7 (H) 4.0 - 10.5 K/uL   RBC 4.25 4.22 - 5.81 MIL/uL   Hemoglobin 12.5 (L) 13.0 - 17.0 g/dL   HCT 62.2 (L) 63.3 - 35.4 %   MCV 84.9 80.0 - 100.0 fL   MCH 29.4 26.0 - 34.0 pg   MCHC 34.6 30.0 - 36.0 g/dL   RDW 56.2 56.3 - 89.3 %   Platelets 241 150 - 400 K/uL   nRBC 0.0 0.0 - 0.2 %   Neutrophils Relative % 91 %   Neutro Abs 11.7 (H) 1.7 - 7.7 K/uL   Lymphocytes Relative 4 %   Lymphs Abs 0.5 (L) 0.7 - 4.0 K/uL   Monocytes Relative 4 %   Monocytes Absolute 0.5 0.1 - 1.0 K/uL   Eosinophils Relative 0 %   Eosinophils Absolute 0.0 0.0 - 0.5 K/uL   Basophils Relative 0 %   Basophils Absolute 0.0 0.0 - 0.1 K/uL   Immature Granulocytes 1 %   Abs Immature Granulocytes 0.07 0.00 - 0.07 K/uL    Comment: Performed at [name], [address] [place], [place] [address]  Magnesium     Status: None   Collection Time: 04/07/21  2:02 AM  Result Value Ref Range   Magnesium 1.7 1.7 - 2.4 mg/dL    Comment: Performed at [name], [address] [place], [place] [address]  Phosphorus     Status: Abnormal   Collection Time: 04/07/21  2:02 AM   Result Value Ref Range   Phosphorus 5.6 (H) 2.5 - 4.6 mg/dL    Comment: Performed at [facility], [address] [place], [place] [address]  sodium     Status: Abnormal   Collection Time: 04/07/21  6:28 AM  Result Value Ref Range   Sodium 127 (L) 135 - 145 mmol/L    Comment: Performed at [facility], [address], [place], [place] [address]  Phosphorus     Status: None   Collection Time: 04/08/21  6:20 AM  Result Value Ref Range   Phosphorus 2.7 2.5 - 4.6 mg/dL    Comment: Performed at [facility], [address] [place], [place] [address]  Basic metabolic panel     Status: Abnormal   Collection Time: 04/08/21  6:20 AM  Result Value Ref Range   Sodium 127 (L) 135 - 145 mmol/L   Potassium 3.6 3.5 - 5.1 mmol/L   Chloride 87 (L) 98 - 111 mmol/L   CO2 34 (H) 22 - 32 mmol/L   Glucose, Bld 127 (H) 70 - 99 mg/dL    Comment: Glucose reference range applies only to samples taken after fasting for at least 8 hours.   BUN 12 8 - 23 mg/dL   Creatinine, Ser 8.45 0.61 - 1.24 mg/dL   Calcium 8.6 (L) 8.9 - 10.3 mg/dL   GFR, Estimated >36 >46 mL/min    Comment: (NOTE) Calculated using the CKD-EPI Creatinine Equation (2021)    Anion gap 6 5 - 15    Comment: Performed at [name], [address] [place], [place] [address]  CBC with Differential/Platelet     Status: Abnormal   Collection Time: 04/08/21  6:20 AM  Result Value Ref Range   WBC 9.9 4.0 - 10.5 K/uL   RBC 4.06 (L) 4.22 - 5.81 MIL/uL   Hemoglobin 12.0 (L) 13.0 - 17.0 g/dL   HCT 60.4 (L) 54.0 - 98.1 %   MCV 86.7 80.0 - 100.0 fL   MCH 29.6 26.0 - 34.0 pg   MCHC 34.1 30.0 - 36.0 g/dL   RDW 19.1 47.8 - 29.5 %   Platelets 252 150 - 400 K/uL   nRBC 0.0 0.0 - 0.2 %   Neutrophils Relative % 90 %   Neutro Abs 9.0 (H) 1.7 - 7.7 K/uL   Lymphocytes Relative 4 %   Lymphs Abs 0.4 (L) 0.7 - 4.0 K/uL   Monocytes Relative 5 %   Monocytes Absolute 0.5 0.1 - 1.0 K/uL   Eosinophils Relative 0  %   Eosinophils Absolute 0.0 0.0 - 0.5 K/uL   Basophils Relative 0 %   Basophils Absolute 0.0 0.0 - 0.1 K/uL   Immature Granulocytes 1 %   Abs Immature Granulocytes 0.07 0.00 - 0.07 K/uL    Comment: Performed at [name], [address] [place], [place] [address]    Current Facility-Administered Medications  Medication Dose Route Frequency Provider Last Rate Last Admin   0.9 %  sodium chloride infusion  250 mL Intravenous Continuous Erin Fulling, MD 10 mL/hr at 04/08/21 1000 Infusion Verify at 04/08/21 1000   acetaminophen (TYLENOL) tablet 650 mg  650 mg Oral Q6H PRN Mansy, Jan A, MD       Or   acetaminophen (TYLENOL) suppository 650 mg  650 mg Rectal Q6H PRN Mansy, Jan A, MD       amLODipine (NORVASC) tablet 5 mg  5 mg Oral Daily Mansy, Jan A, MD   5 mg at 04/08/21 1035   Ampicillin-Sulbactam (UNASYN) 3 g in sodium chloride 0.9 % 100 mL IVPB  3 g Intravenous Q6H Nazari, Walid A, RPH 200 mL/hr at 04/08/21 1330 3 g at 04/08/21 1330   atorvastatin (LIPITOR) tablet 20 mg  20 mg Oral QHS Sharen Hones, RPH   20 mg at 04/07/21 2133   bisacodyl (DULCOLAX) suppository 10 mg  10 mg Rectal Daily PRN Mansy, Jan A, MD   10 mg at 04/07/21 2213   budesonide (PULMICORT) nebulizer solution 0.5 mg  0.5 mg Nebulization BID Erin Fulling, MD   0.5 mg at 04/08/21 0744   buPROPion ER (WELLBUTRIN SR) 12 hr tablet 100 mg  100 mg Oral Daily Mansy, Jan A, MD   100 mg at 04/08/21 1037   carvedilol (COREG) tablet 25 mg  25 mg Oral BID WC Mansy, Jan A, MD   25 mg at 04/08/21 8657   Chlorhexidine Gluconate Cloth 2 % PADS 6 each  6 each Topical Q4696 Erin Fulling, MD   6 each at 04/08/21 1039   cholecalciferol (VITAMIN D3) tablet 1,000 Units  1,000 Units Oral Daily Mansy, Jan A, MD   1,000 Units at 04/08/21 1035   cloZAPine (CLOZARIL) tablet 50 mg  50 mg Oral QHS Clapacs, John T, MD   50 mg at 04/07/21 2156   docusate sodium (COLACE) capsule 100 mg  100 mg Oral BID Sharen Hones, RPH   100 mg at  04/08/21 1035   dorzolamide-timolol (COSOPT) 22.3-6.8 MG/ML ophthalmic solution 1 drop  1 drop Both Eyes BID Mansy, Jan  A, MD   1 drop at 04/08/21 1038   enoxaparin (LOVENOX) injection 40 mg  40 mg Subcutaneous Q24H Mansy, Jan A, MD   40 mg at 04/07/21 2134   ezetimibe (ZETIA) tablet 10 mg  10 mg Oral QHS Mansy, Jan A, MD   10 mg at 04/07/21 2133   [START ON 04/09/2021] famotidine (PEPCID) IVPB 20 mg in NS 100 mL IVPB  20 mg Intravenous Q24H Sharen HonesDolan, Carissa E, RPH       fluconazole (DIFLUCAN) IVPB 100 mg  100 mg Intravenous Q24H Sharen HonesDolan, Carissa E, RPH   Stopped at 04/07/21 2023   guaiFENesin (ROBITUSSIN) 100 MG/5ML solution 100 mg  5 mL Oral Q4H PRN Azucena FallenLancaster, William C, MD       hydrOXYzine (VISTARIL) capsule 25 mg  25 mg Oral BID PRN Mansy, Jan A, MD       ipratropium-albuterol (DUONEB) 0.5-2.5 (3) MG/3ML nebulizer solution 3 mL  3 mL Nebulization Q2H PRN Azucena FallenLancaster, William C, MD       ipratropium-albuterol (DUONEB) 0.5-2.5 (3) MG/3ML nebulizer solution 3 mL  3 mL Nebulization Q6H Azucena FallenLancaster, William C, MD   3 mL at 04/08/21 1323   irbesartan (AVAPRO) tablet 37.5 mg  37.5 mg Oral Daily Mansy, Jan A, MD   37.5 mg at 04/08/21 1037   isosorbide mononitrate (IMDUR) 24 hr tablet 30 mg  30 mg Oral BID Mansy, Jan A, MD   30 mg at 04/08/21 1034   latanoprost (XALATAN) 0.005 % ophthalmic solution 1 drop  1 drop Both Eyes QHS Mansy, Jan A, MD   1 drop at 04/07/21 2137   magnesium hydroxide (MILK OF MAGNESIA) suspension 30 mL  30 mL Oral Daily PRN Mansy, Jan A, MD       MEDLINE mouth rinse  15 mL Mouth Rinse q12n4p Erin FullingKasa, Kurian, MD   15 mL at 04/08/21 1529   methylPREDNISolone sodium succinate (SOLU-MEDROL) 40 mg/mL injection 20 mg  20 mg Intravenous Q12H Erin FullingKasa, Kurian, MD   20 mg at 04/08/21 1329   nitroGLYCERIN (NITROSTAT) SL tablet 0.4 mg  0.4 mg Sublingual Q5 min PRN Mansy, Jan A, MD       ondansetron [name](ZOFRAN) tablet 4 mg  4 mg Oral Q6H PRN Mansy, Jan A, MD       Or   ondansetron [facility](ZOFRAN) injection 4 mg  4  mg Intravenous Q6H PRN Mansy, Jan A, MD       pantoprazole (PROTONIX) EC tablet 40 mg  40 mg Oral BID Mansy, Jan A, MD   40 mg at 04/08/21 1035   polyethylene glycol (MIRALAX / GLYCOLAX) packet 17 g  17 g Per Tube Daily Erin FullingKasa, Kurian, MD   17 g at 04/07/21 0854   senna (SENOKOT) tablet 8.6-17.2 mg  1-2 tablet Oral Daily PRN Mansy, Jan A, MD   8.6 mg at 04/07/21 0844   sodium chloride flush (NS) 0.9 % injection 10-40 mL  10-40 mL Intracatheter Q12H Azucena FallenLancaster, William C, MD   10 mL at 04/08/21 1035   sodium chloride flush (NS) 0.9 % injection 10-40 mL  10-40 mL Intracatheter PRN Azucena FallenLancaster, William C, MD       sodium chloride tablet 1 g  1 g Oral TID WC Mansy, Jan A, MD   1 g at 04/08/21 1258   tamsulosin (FLOMAX) capsule 0.4 mg  0.4 mg Oral Daily Mansy, Jan A, MD   0.4 mg at 04/08/21 1035   temazepam (RESTORIL) capsule 7.5 mg  7.5 mg Oral  QHS Clapacs, Jackquline Denmark, MD   7.5 mg at 04/07/21 2157   traZODone (DESYREL) tablet 25 mg  25 mg Oral QHS PRN Mansy, Vernetta Honey, MD        Musculoskeletal: Strength & Muscle Tone: decreased Gait & Station: did not witness Patient leans: N/A  Psychiatric Specialty Exam: Physical Exam Vitals and nursing note reviewed.  Constitutional:      Appearance: Normal appearance.  HENT:     Head: Normocephalic and atraumatic.     Mouth/Throat:     Pharynx: Oropharynx is clear.  Cardiovascular:     Rate and Rhythm: Normal rate and regular rhythm.  Pulmonary:    Abdominal:     General: Abdomen is flat.     Palpations: Abdomen is soft.  Skin:    General: Skin is warm and dry.  Neurological:     General: No focal deficit present.     Mental Status: Mental status is at baseline.  Psychiatric:        Attention and Perception: Attention and perception normal.        Mood and Affect: Mood and affect normal.        Speech: Speech normal.        Behavior: Behavior normal. Behavior is cooperative.        Thought Content: Thought content normal.        Cognition and Memory:  Cognition and memory normal.        Judgment: Judgment normal.    Review of Systems  Unable to perform ROS: Intubated  Psychiatric/Behavioral:  Positive for substance abuse.   All other systems reviewed and are negative.  Blood pressure 119/80, pulse (!) 102, temperature 97.8 F (36.6 C), resp. rate 14, height 6' (1.829 m), weight 78.7 kg, SpO2 91 %.Body mass index is 23.53 kg/m.  General Appearance: Casual  Eye Contact:  Good  Speech:  Normal Rate  Volume:  Normal  Mood:  Euthymic  Affect:  Congruent  Thought Process:  Coherent and Descriptions of Associations: Intact  Orientation:  Full (Time, Place, and Person)  Thought Content:  WDL and Logical  Suicidal Thoughts:  No  Homicidal Thoughts:  No  Memory:  Immediate;   Fair Recent;   Fair Remote;   Fair  Judgement:  Fair  Insight:  Fair  Psychomotor Activity:  Decreased  Concentration:  Concentration: Fair and Attention Span: Fair  Recall:  [employer] of Knowledge:  Fair  Language:  Good  Akathisia:  No  Handed:  Right  AIMS (if indicated):     Assets:  Leisure Time Resilience Social Support  ADL's:  Impaired  Cognition:  WNL  Sleep:        Physical Exam: Physical Exam Vitals and nursing note reviewed.  Constitutional:      Appearance: Normal appearance.  HENT:     Head: Normocephalic and atraumatic.     Mouth/Throat:     Pharynx: Oropharynx is clear.  Cardiovascular:     Rate and Rhythm: Normal rate and regular rhythm.  Pulmonary:    Abdominal:     General: Abdomen is flat.     Palpations: Abdomen is soft.  Skin:    General: Skin is warm and dry.  Neurological:     General: No focal deficit present.     Mental Status: Mental status is at baseline.  Psychiatric:        Attention and Perception: Attention and perception normal.        Mood and  Affect: Mood and affect normal.        Speech: Speech normal.        Behavior: Behavior normal. Behavior is cooperative.        Thought Content: Thought  content normal.        Cognition and Memory: Cognition and memory normal.        Judgment: Judgment normal.   Review of Systems  Unable to perform ROS: Intubated  Psychiatric/Behavioral:  Positive for substance abuse.   All other systems reviewed and are negative. Blood pressure 110/77, pulse 83, temperature 98 F (36.7 C), temperature source Oral, resp. rate 16, height 6' (1.829 m), weight 78.7 kg, SpO2 97 %. Body mass index is 23.53 kg/m.  Treatment Plan Summary: Schizoaffective disorder, bipolar type: -Continue Clozaril 50 mg daily -Continue Wellbutrin 100 mg daily -Follow up with [name]  Insomnia: -Continue Restoril 7.5 mg at bedtime -Continue Trazodone 25 mg at bedtime PRN  Disposition: psychiatrically cleared  Nanine Means, NP 04/08/2021 4:42 PM

## 2021-04-09 LAB — CBC WITH DIFFERENTIAL/PLATELET
Abs Immature Granulocytes: 0.07 10*3/uL (ref 0.00–0.07)
Basophils Absolute: 0 10*3/uL (ref 0.0–0.1)
Basophils Relative: 0 %
Eosinophils Absolute: 0 10*3/uL (ref 0.0–0.5)
Eosinophils Relative: 0 %
HCT: 34.2 % — ABNORMAL LOW (ref 39.0–52.0)
Hemoglobin: 11.2 g/dL — ABNORMAL LOW (ref 13.0–17.0)
Immature Granulocytes: 1 %
Lymphocytes Relative: 4 %
Lymphs Abs: 0.4 10*3/uL — ABNORMAL LOW (ref 0.7–4.0)
MCH: 28.6 pg (ref 26.0–34.0)
MCHC: 32.7 g/dL (ref 30.0–36.0)
MCV: 87.5 fL (ref 80.0–100.0)
Monocytes Absolute: 0.4 10*3/uL (ref 0.1–1.0)
Monocytes Relative: 4 %
Neutro Abs: 9.1 10*3/uL — ABNORMAL HIGH (ref 1.7–7.7)
Neutrophils Relative %: 91 %
Platelets: 265 10*3/uL (ref 150–400)
RBC: 3.91 MIL/uL — ABNORMAL LOW (ref 4.22–5.81)
RDW: 13.3 % (ref 11.5–15.5)
WBC: 9.9 10*3/uL (ref 4.0–10.5)
nRBC: 0 % (ref 0.0–0.2)

## 2021-04-09 LAB — BASIC METABOLIC PANEL
Anion gap: 7 (ref 5–15)
BUN: 14 mg/dL (ref 8–23)
CO2: 33 mmol/L — ABNORMAL HIGH (ref 22–32)
Calcium: 8.7 mg/dL — ABNORMAL LOW (ref 8.9–10.3)
Chloride: 92 mmol/L — ABNORMAL LOW (ref 98–111)
Creatinine, Ser: 0.6 mg/dL — ABNORMAL LOW (ref 0.61–1.24)
GFR, Estimated: >60 mL/min (ref >60)
Glucose, Bld: 117 mg/dL — ABNORMAL HIGH (ref 70–99)
Potassium: 3.6 mmol/L (ref 3.5–5.1)
Sodium: 132 mmol/L — ABNORMAL LOW (ref 135–145)

## 2021-04-09 LAB — PHOSPHORUS: Phosphorus: 3.6 mg/dL (ref 2.5–4.6)

## 2021-04-09 LAB — CULTURE, RESPIRATORY W GRAM STAIN: Culture: NORMAL

## 2021-04-09 MED ORDER — IPRATROPIUM-ALBUTEROL 0.5-2.5 (3) MG/3ML IN SOLN
3.0000 mL | Freq: Three times a day (TID) | RESPIRATORY_TRACT | Status: DC
Start: 2021-04-09 — End: 2021-04-10
  Administered 2021-04-09 – 2021-04-10 (×2): 3 mL via RESPIRATORY_TRACT
  Filled 2021-04-09 (×2): qty 3

## 2021-04-09 MED ORDER — CLOZAPINE 25 MG PO TABS
150.0000 mg | ORAL_TABLET | Freq: Every day | ORAL | Status: DC
  Administered 2021-04-09 – 2021-04-10 (×2): 150 mg via ORAL
  Filled 2021-04-09 (×2): qty 2
  Filled 2021-04-09 (×2): qty 6

## 2021-04-09 MED ORDER — FLUCONAZOLE 100 MG PO TABS
100.0000 mg | ORAL_TABLET | Freq: Every day | ORAL | Status: DC
  Administered 2021-04-09 – 2021-04-10 (×2): 100 mg via ORAL
  Filled 2021-04-09 (×3): qty 1

## 2021-04-09 MED ORDER — FAMOTIDINE 20 MG PO TABS
20.0000 mg | ORAL_TABLET | Freq: Every day | ORAL | Status: DC
  Administered 2021-04-10 – 2021-04-11 (×2): 20 mg via ORAL
  Filled 2021-04-09 (×2): qty 1

## 2021-04-09 NOTE — Progress Notes (Signed)
PHARMACIST - PHYSICIAN COMMUNICATION  DR: [name]  CONCERNING: IV to Oral Route Change Policy  RECOMMENDATION: This patient is receiving fluconazole by the intravenous route.  Based on criteria approved by the Pharmacy and Therapeutics Committee, the intravenous medication(s) is/are being converted to the equivalent oral dose form(s).   DESCRIPTION: These criteria include: The patient is eating (either orally or via tube) and/or has been taking other orally administered medications for a least 24 hours The patient has no evidence of active gastrointestinal bleeding or impaired GI absorption (gastrectomy, short bowel, patient on TNA or NPO).  If you have questions about this conversion, please contact the Pharmacy Department  []   [phone] )  ( [phone] [x]   [phone] )  [facility] []   [phone] )  [facility] []   [phone] )  [facility] []   [phone] )  [facility]   ( [phone], [facility] 04/09/2021 2:27 PM

## 2021-04-09 NOTE — TOC Progression Note (Signed)
Transition of Care [facility]) - Progression Note    Patient Details  Name: [name] MRN: [id number] Date of Birth: [birth year]  [facility]) CM/SW Contact  Su Hilt, RN Phone Number: 04/09/2021, 10:33 AM  Clinical Narrative:    Met with the patient at the bedside He stated that his plan is to return to [facility] for STR at DC I asked Otila Kluver at [facility] if they are willing for the patient to return, she stated that yes they would accept him back, FL2 completed, will get ins auth when medically stable       Expected Discharge Plan and Services                                                 Social Determinants of Health (SDOH) Interventions    Readmission Risk Interventions Readmission Risk Prevention Plan 03/24/2021  Transportation Screening Complete  PCP or Specialist Appt within 5-7 Days Complete  Home Care Screening Complete  Medication Review (RN CM) Complete  Some recent data might be hidden

## 2021-04-09 NOTE — Progress Notes (Signed)
PROGRESS NOTE    Brandon [name]  Brandon [name] DOB: 1956-08-08 DOA: 04/03/2021 PCP: Jodi Marble, MD   Brief Narrative:  Brandon [name] is a 65 y.o. Caucasian male with medical history significant for chronic hyponatremia, COPD, coronary artery disease status post PCI and stent, hypertension, schizoaffective disorder bipolar type, dyslipidemia, schizophrenia, seizure disorder and anemia, who presented to the ER with acute onset of generalized weakness and fatigue worsening over the last 3 to 4 days - repeat labs show worsening hyponatremia from baseline. He reports that he drinks about 2.5 quarts of water that used to be a gallon in the past, 1 cup of coffee, 2 cups of tea with meals and half a cup of orange juice with breakfast.  He tells me that they wanted to recently increase his clozapine to 200 mg daily at his SNF but he feels more than 100 mg/day is significantly sedative and almost paralyzes him. Psychiatry consulted for further insight and recommendations on medications.  Assessment & Plan:   Acute hypoxic respiratory failure in the setting of likely aspiration versus mucous plugging, resolving - Continue supportive care oxygen, Mucomyst, guaifenesin and respiratory PT - Patient successfully intubated bronched and extubated on the 27th, remains on room air at this point without desaturations or symptoms  - Continue Unasyn for coverage given possible aspiration  -Oxygen walk screen pending  Acute symptomatic on chronic hyponatremia with generalized weakness, POA.   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 - Continue Norvasc, Coreg and ARB therapy.  Dyslipidemia. - Continue statin  Glaucoma. - Continue Cosopt, Xalatan eyedrops  Coronary artery disease. - We  will continue ARB, beta-blocker therapy and as needed sublingual nitroglycerin as well as statin therapy.  BPH. - Continue Flomax.  Schizoaffective disorder, bipolar type and history of schizophrenia. - We will continue his psychotropic medications per psych -given improvement with tolvaptan could consider increasing dose of clozapine back to therapeutic per psych recommendations -Patient concerned lower dose of clozapine "is not enough" -requesting to be back on 150 mg -will discuss with psych  DVT prophylaxis: Lovenox Code Status: Full Family Communication: None present  Status is: Inpatient  Dispo: The patient is from: Home              Anticipated d/c is to: Home              Anticipated d/c date is: 24-48 hours pending repeat sodium, hypoxia and resolution of respiratory failure              Patient currently not medically stable for discharge  Consultants:  Psychiatry PCCM  Procedures:  Bronchoscopy 04/06/2021  Antimicrobials:  Unasyn 04/06/2021, ongoing  Subjective: No acute issues or events overnight, respiratory status improving otherwise denies nausea vomiting diarrhea constipation headache fevers chills or chest pain  Objective: Vitals:   04/08/21 1400 04/08/21 1600 04/08/21 1956 04/09/21 0547  BP: 100/80 110/77 138/61 113/75  Pulse: 92 83 80 86  Resp: (!) _0 Temp:   98.3 F (36.8 C) 98.3 F (36.8 C)  TempSrc:      SpO2: 97% 97% 92% 91%  Weight:      Height:        Intake/Output Summary (Last 24 hours) at 04/09/2021 0759 Last data filed at 04/09/2021 0700  Gross per 24 hour  Intake 285.94 ml  Output 950 ml  Net -664.06 ml    Filed Weights   04/03/21 1836 04/06/21 0935  Weight: 82.6 kg 78.7 kg    Examination:  General:  Pleasantly resting in bed, mild distress, tachypneic but nondiaphoretic HEENT:  Normocephalic atraumatic.  Sclerae nonicteric, noninjected.  Extraocular movements intact bilaterally. Neck:  Without mass or deformity.   Trachea is midline. Lungs: Apical left-sided rhonchi without overt wheezes rhonchi or rales otherwise Heart:  Regular rate and rhythm.  Without murmurs, rubs, or gallops. Abdomen:  Soft, nontender, nondistended.  Without guarding or rebound. Extremities: Without cyanosis, clubbing, edema, or obvious deformity. Vascular:  Dorsalis pedis and posterior tibial pulses palpable bilaterally. Skin:  Warm and dry, no erythema, no ulcerations.  Data Reviewed: I have personally reviewed following labs and imaging studies  CBC: Recent Labs  Lab 04/03/21 1850 04/04/21 0405 04/07/21 0202 04/08/21 0620 04/09/21 0515  WBC 7.4  7.5 6.2 12.7* 9.9 9.9  NEUTROABS 5.5  --  11.7* 9.0* 9.1*  HGB 11.9*  12.0* 12.2* 12.5* 12.0* 11.2*  HCT 34.6*  34.3* 36.3* 36.1* 35.2* 34.2*  MCV 83.2  83.1 82.7 84.9 86.7 87.5  PLT 278  263 265 241 252 426    Basic Metabolic Panel: Recent Labs  Lab 04/03/21 1850 04/04/21 0405 04/05/21 1715 04/05/21 2018 04/06/21 0147 04/06/21 0818 04/06/21 1152 04/06/21 2017 04/07/21 0202 04/07/21 0628 04/08/21 0620 04/09/21 0515  NA 119*   < > 115*   < > 120* 121*   < > 124* 125* 127* 127* 132*  K 3.4*   < > 3.5  --  3.6  --   --   --  3.8  --  3.6 3.6  CL 82*   < > 80*  --  82*  --   --   --  84*  --  87* 92*  CO2 30   < > 28  --  32  --   --   --  31  --  34* 33*  GLUCOSE 113*   < > 101*  --  111*  --   --   --  125*  --  127* 117*  BUN 9   < > 6*  --  5*  --   --   --  13  --  12 14  CREATININE 0.49*   < > 0.31*  --  0.46*  --   --   --  0.92  --  0.65 0.60*  CALCIUM 8.4*   < > 7.8*  --  8.7*  --   --   --  8.5*  --  8.6* 8.7*  MG 1.9  --   --   --   --  1.7  --   --  1.7  --   --   --   PHOS  --   --   --   --   --   --   --   --  5.6*  --  2.7 3.6   < > = values in this interval not displayed.    GFR: Estimated Creatinine Clearance: 101 mL/min (A) (by C-G formula based on SCr of 0.6 mg/dL (L)). Liver Function Tests: Recent Labs  Lab 04/05/21 1715  AST  14*  ALT 20  ALKPHOS 60  BILITOT 0.7  PROT 5.3*  ALBUMIN 2.9*    No results for input(s): LIPASE, AMYLASE in the last 168 hours. No results  for input(s): AMMONIA in the last 168 hours. Coagulation Profile: No results for input(s): INR, PROTIME in the last 168 hours. Cardiac Enzymes: No results for input(s): CKTOTAL, CKMB, CKMBINDEX, TROPONINI in the last 168 hours. BNP (last 3 results) No results for input(s): PROBNP in the last 8760 hours. HbA1C: No results for input(s): HGBA1C in the last 72 hours. CBG: Recent Labs  Lab 04/03/21 1946 04/06/21 0638 04/06/21 0939 04/06/21 2015  GLUCAP 110* 112* 105* 128*    Lipid Profile: No results for input(s): CHOL, HDL, LDLCALC, TRIG, CHOLHDL, LDLDIRECT in the last 72 hours. Thyroid Function Tests: No results for input(s): TSH, T4TOTAL, FREET4, T3FREE, THYROIDAB in the last 72 hours. Anemia Panel: No results for input(s): VITAMINB12, FOLATE, FERRITIN, TIBC, IRON, RETICCTPCT in the last 72 hours. Sepsis Labs: No results for input(s): PROCALCITON, LATICACIDVEN in the last 168 hours.  Recent Results (from the past 240 hour(s))  MRSA Next Gen by PCR, Nasal     Status: None   Collection Time: 04/04/21  6:20 AM   Specimen: Nasal Mucosa; Nasal Swab  Result Value Ref Range Status   MRSA by PCR Next Gen NOT DETECTED NOT DETECTED Final    Comment: (NOTE) The GeneXpert MRSA Assay (FDA approved for NASAL specimens only), is one component of a comprehensive MRSA colonization surveillance program. It is not intended to diagnose MRSA infection nor to guide or monitor treatment for MRSA infections. Test performance is not FDA approved in patients less than 53 years old. Performed at [facility], [address] [place], [place] [address]   Resp Panel by RT-PCR (Flu A&B, Covid)     Status: None   Collection Time: 04/06/21  9:24 AM  Result Value Ref Range Status   SARS Coronavirus 2 by RT PCR NEGATIVE NEGATIVE Final    Comment:  (NOTE) SARS-CoV-2 target nucleic acids are NOT DETECTED.  The SARS-CoV-2 RNA is generally detectable in upper respiratory specimens during the acute phase of infection. The lowest concentration of SARS-CoV-2 viral copies this assay can detect is 138 copies/mL. A negative result does not preclude SARS-Cov-2 infection and should not be used as the sole basis for treatment or other patient management decisions. A negative result may occur with  improper specimen collection/handling, submission of specimen other than nasopharyngeal swab, presence of viral mutation(s) within the areas targeted by this assay, and inadequate number of viral copies(<138 copies/mL). A negative result must be combined with clinical observations, patient history, and epidemiological information. The expected result is Negative.  Fact Sheet for Patients:  EntrepreneurPulse.com.au  Fact Sheet for Healthcare Providers:  IncredibleEmployment.be  This test is no t yet approved or cleared by the [place] FDA and  has been authorized for detection and/or diagnosis of SARS-CoV-2 by FDA under an Emergency Use Authorization (EUA). This EUA will remain  in effect (meaning this test can be used) for the duration of the COVID-19 declaration under Section 564(b)(1) of the Act, 21 U.S.C.section 360bbb-3(b)(1), unless the authorization is terminated  or revoked sooner.       Influenza A by PCR NEGATIVE NEGATIVE Final   Influenza B by PCR NEGATIVE NEGATIVE Final    Comment: (NOTE) The Xpert Xpress SARS-CoV-2/FLU/RSV plus assay is intended as an aid in the diagnosis of influenza from Nasopharyngeal swab specimens and should not be used as a sole basis for treatment. Nasal washings and aspirates are unacceptable for Xpert Xpress SARS-CoV-2/FLU/RSV testing.  Fact Sheet for Patients: EntrepreneurPulse.com.au  Fact Sheet for Healthcare  Providers: IncredibleEmployment.be  This test is not yet approved or cleared by the [place] and has been authorized for detection and/or diagnosis of SARS-CoV-2 by FDA under an Emergency Use Authorization (EUA). This EUA will remain in effect (meaning this test can be used) for the duration of the COVID-19 declaration under Section 564(b)(1) of the Act, 21 U.S.C. section 360bbb-3(b)(1), unless the authorization is terminated or revoked.  Performed at [facility], [address] [place], [place] [address]   Culture, Respiratory w Gram Stain     Status: None (Preliminary result)   Collection Time: 04/06/21 12:58 PM   Specimen: Bronchoalveolar Lavage; Respiratory  Result Value Ref Range Status   Specimen Description   Final    BRONCHIAL ALVEOLAR LAVAGE Performed at [facility], [address], [place], [place] [address]    Special Requests   Final    NONE Performed at [facility], [place], [place] [address]    Gram Stain   Final    FEW SQUAMOUS EPITHELIAL CELLS PRESENT RARE WBC SEEN MODERATE GRAM POSITIVE COCCI    Culture   Final    CULTURE REINCUBATED FOR BETTER GROWTH Performed at [facility], [place] [address] [place], [place] [address]    Report Status PENDING  Incomplete      Radiology Studies: No results found.  Scheduled Meds:  amLODipine  5 mg Oral Daily   atorvastatin  20 mg Oral QHS   budesonide (PULMICORT) nebulizer solution  0.5 mg Nebulization BID   buPROPion ER  100 mg Oral Daily   carvedilol  25 mg Oral BID WC   Chlorhexidine Gluconate Cloth  6 each Topical Q0600   cholecalciferol  1,000 Units Oral Daily   cloZAPine  50 mg Oral QHS   docusate sodium  100 mg Oral BID   dorzolamide-timolol  1 drop Both Eyes BID   enoxaparin (LOVENOX) injection  40 mg Subcutaneous Q24H   ezetimibe  10 mg Oral QHS   famotidine (PEPCID) IV  20 mg Intravenous Q24H   ipratropium-albuterol  3  mL Nebulization Q6H   irbesartan  37.5 mg Oral Daily   isosorbide mononitrate  30 mg Oral BID   latanoprost  1 drop Both Eyes QHS   mouth rinse  15 mL Mouth Rinse q12n4p   methylPREDNISolone (SOLU-MEDROL) injection  20 mg Intravenous Q12H   pantoprazole  40 mg Oral BID   polyethylene glycol  17 g Per Tube Daily   sodium chloride flush  10-40 mL Intracatheter Q12H   sodium chloride  1 g Oral TID WC   tamsulosin  0.4 mg Oral Daily   temazepam  7.5 mg Oral QHS   Continuous Infusions:  sodium chloride 10 mL/hr at 04/08/21 1600   ampicillin-sulbactam (UNASYN) IV 3 g (04/09/21 0146)   fluconazole (DIFLUCAN) IV 100 mg (04/08/21 2347)      LOS: 6 days   Time spent: 42mn  Keiera Strathman C Serina Nichter, DO [facility]  If 7PM-7AM, please contact night-coverage [email or web address]  04/09/2021, 7:59 AM

## 2021-04-09 NOTE — [place] FL2 (Signed)
[place] MEDICAID FL2 LEVEL OF CARE SCREENING TOOL     IDENTIFICATION  Patient Name: Brandon [name] Birthdate: Nov 29, 1955 Sex: male Admission Date (Current Location): 04/03/2021  [facility] and [place] Number:  Chiropodist and Address:  [facility], [address] [place], [place] [address]      Provider Number: [id number]  Attending Physician Name and Address:  Azucena Fallen, MD  Relative Name and Phone Number:  Brandon [name], Brandon [name] (Brother)   [phone] [facility] Phone)    Current Level of Care: Hospital Recommended Level of Care: Skilled Nursing Facility Prior Approval Number:    Date Approved/Denied:   PASRR Number: [id number]  Discharge Plan: SNF    Current Diagnoses: Patient Active Problem List   Diagnosis Date Noted   Lobar pneumonia, unspecified organism (HCC)    Vitamin D deficiency    Epigastric abdominal pain    Recurrent falls    Hyponatremia 03/22/2021   BPH (benign prostatic hyperplasia) 03/22/2021   Weakness    Schizophrenia (HCC)    Acute hyponatremia 11/27/2020   Appendicitis with perforation 07/17/2018   Acute cholecystitis 04/10/2018   Chronic cholecystitis 04/08/2018   Atherosclerosis of native arteries of extremity with intermittent claudication (HCC) 11/22/2017   Essential hypertension 11/22/2017   Hyperlipidemia 11/22/2017   COPD (chronic obstructive pulmonary disease) (HCC) 11/22/2017   CAD (coronary artery disease) 10/15/2017   Other toe(s) amputation status 11/17/2014   Schizoaffective disorder, bipolar type (HCC) 08/23/2008    Orientation RESPIRATION BLADDER Height & Weight     Self, Time, Situation, Place  Normal Continent Weight: 78.7 kg Height:  6' (182.9 cm)  BEHAVIORAL SYMPTOMS/MOOD NEUROLOGICAL BOWEL NUTRITION STATUS      Continent Diet (1500 ml fluid restriction)  AMBULATORY STATUS COMMUNICATION OF NEEDS Skin   Limited Assist Verbally Other (Comment) (dry skin)                        Personal Care Assistance Level of Assistance  Bathing, Feeding, Dressing Bathing Assistance: Limited assistance Feeding assistance: Independent Dressing Assistance: Limited assistance     Functional Limitations Info             SPECIAL CARE FACTORS FREQUENCY  PT (By licensed PT), OT (By licensed OT)     PT Frequency: 5 times per week OT Frequency: 5 times per week            Contractures Contractures Info: Not present    Additional Factors Info  Code Status, Allergies Code Status Info: Full code Allergies Info: Compazine Prochlorperazine, Ivp Dye, Alcohol-sulfur  Benadryl , Depakote  Dramamine Dimenhydrinate, Plavix Copidogrel, Rosuvastatin           Current Medications (04/09/2021):  This is the current hospital active medication list Current Facility-Administered Medications  Medication Dose Route Frequency Provider Last Rate Last Admin   0.9 %  sodium chloride infusion  250 mL Intravenous Continuous Erin Fulling, MD 10 mL/hr at 04/08/21 1600 Infusion Verify at 04/08/21 1600   acetaminophen (TYLENOL) tablet 650 mg  650 mg Oral Q6H PRN Mansy, Jan A, MD       Or   acetaminophen (TYLENOL) suppository 650 mg  650 mg Rectal Q6H PRN Mansy, Jan A, MD       amLODipine (NORVASC) tablet 5 mg  5 mg Oral Daily Mansy, Jan A, MD   5 mg at 04/09/21 0956   Ampicillin-Sulbactam (UNASYN) 3 g in sodium chloride 0.9 % 100 mL IVPB  3 g  Intravenous Q6H Mila Merry A, RPH 200 mL/hr at 04/09/21 1003 3 g at 04/09/21 1003   atorvastatin (LIPITOR) tablet 20 mg  20 mg Oral QHS Sharen Hones, RPH   20 mg at 04/08/21 2111   bisacodyl (DULCOLAX) suppository 10 mg  10 mg Rectal Daily PRN Mansy, Jan A, MD   10 mg at 04/07/21 2213   budesonide (PULMICORT) nebulizer solution 0.5 mg  0.5 mg Nebulization BID Erin Fulling, MD   0.5 mg at 04/09/21 0806   buPROPion ER [facility] SR) 12 hr tablet 100 mg  100 mg Oral Daily Mansy, Jan A, MD   100 mg at 04/09/21 0957   carvedilol (COREG) tablet 25 mg   25 mg Oral BID WC Mansy, Jan A, MD   25 mg at 04/09/21 0093   Chlorhexidine Gluconate Cloth 2 % PADS 6 each  6 each Topical G1829 Erin Fulling, MD   6 each at 04/09/21 9371   cholecalciferol (VITAMIN D3) tablet 1,000 Units  1,000 Units Oral Daily Mansy, Jan A, MD   1,000 Units at 04/09/21 6967   cloZAPine (CLOZARIL) tablet 50 mg  50 mg Oral QHS Clapacs, John T, MD   50 mg at 04/08/21 2315   docusate sodium (COLACE) capsule 100 mg  100 mg Oral BID Sharen Hones, RPH   100 mg at 04/09/21 0956   dorzolamide-timolol (COSOPT) 22.3-6.8 MG/ML ophthalmic solution 1 drop  1 drop Both Eyes BID Mansy, Jan A, MD   1 drop at 04/09/21 0957   enoxaparin (LOVENOX) injection 40 mg  40 mg Subcutaneous Q24H Mansy, Jan A, MD   40 mg at 04/08/21 2156   ezetimibe (ZETIA) tablet 10 mg  10 mg Oral QHS Mansy, Jan A, MD   10 mg at 04/08/21 2314   famotidine (PEPCID) IVPB 20 mg in NS 100 mL IVPB  20 mg Intravenous Q24H Sharen Hones, RPH       fluconazole (DIFLUCAN) IVPB 100 mg  100 mg Intravenous Q24H Sharen Hones, RPH 50 mL/hr at 04/08/21 2347 100 mg at 04/08/21 2347   guaiFENesin (ROBITUSSIN) 100 MG/5ML solution 100 mg  5 mL Oral Q4H PRN Azucena Fallen, MD       hydrOXYzine (VISTARIL) capsule 25 mg  25 mg Oral BID PRN Mansy, Jan A, MD       ipratropium-albuterol (DUONEB) 0.5-2.5 (3) MG/3ML nebulizer solution 3 mL  3 mL Nebulization Q2H PRN Azucena Fallen, MD       ipratropium-albuterol (DUONEB) 0.5-2.5 (3) MG/3ML nebulizer solution 3 mL  3 mL Nebulization Q6H Azucena Fallen, MD   3 mL at 04/09/21 0805   irbesartan (AVAPRO) tablet 37.5 mg  37.5 mg Oral Daily Mansy, Jan A, MD   37.5 mg at 04/09/21 0956   isosorbide mononitrate (IMDUR) 24 hr tablet 30 mg  30 mg Oral BID Mansy, Jan A, MD   30 mg at 04/09/21 0956   latanoprost (XALATAN) 0.005 % ophthalmic solution 1 drop  1 drop Both Eyes QHS Mansy, Jan A, MD   1 drop at 04/08/21 2112   magnesium hydroxide (MILK OF MAGNESIA) suspension 30 mL  30 mL  Oral Daily PRN Mansy, Jan A, MD       MEDLINE mouth rinse  15 mL Mouth Rinse q12n4p Belia Heman, Kurian, MD   15 mL at 04/08/21 1529   methylPREDNISolone sodium succinate (SOLU-MEDROL) 40 mg/mL injection 20 mg  20 mg Intravenous Q12H Erin Fulling, MD   20 mg at  04/08/21 2350   nitroGLYCERIN (NITROSTAT) SL tablet 0.4 mg  0.4 mg Sublingual Q5 min PRN Mansy, Jan A, MD       ondansetron [facility]) tablet 4 mg  4 mg Oral Q6H PRN Mansy, Jan A, MD       Or   ondansetron [facility]) injection 4 mg  4 mg Intravenous Q6H PRN Mansy, Jan A, MD       pantoprazole (PROTONIX) EC tablet 40 mg  40 mg Oral BID Mansy, Jan A, MD   40 mg at 04/09/21 0956   polyethylene glycol (MIRALAX / GLYCOLAX) packet 17 g  17 g Per Tube Daily Erin Fulling, MD   17 g at 04/09/21 0958   senna (SENOKOT) tablet 8.6-17.2 mg  1-2 tablet Oral Daily PRN Mansy, Jan A, MD   8.6 mg at 04/07/21 0844   sodium chloride flush (NS) 0.9 % injection 10-40 mL  10-40 mL Intracatheter Q12H Azucena Fallen, MD   10 mL at 04/09/21 0957   sodium chloride flush (NS) 0.9 % injection 10-40 mL  10-40 mL Intracatheter PRN Azucena Fallen, MD       sodium chloride tablet 1 g  1 g Oral TID WC Mansy, Jan A, MD   1 g at 04/08/21 1645   tamsulosin (FLOMAX) capsule 0.4 mg  0.4 mg Oral Daily Mansy, Jan A, MD   0.4 mg at 04/09/21 0956   temazepam (RESTORIL) capsule 7.5 mg  7.5 mg Oral QHS Clapacs, John T, MD   7.5 mg at 04/08/21 2111   traZODone (DESYREL) tablet 25 mg  25 mg Oral QHS PRN Mansy, Vernetta Honey, MD         Discharge Medications: Please see discharge summary for a list of discharge medications.  Relevant Imaging Results:  Relevant Lab Results:   Additional Information SS #: [id number]  Herley Bernardini Clementeen Hoof, RN

## 2021-04-10 DIAGNOSIS — J9601 Acute respiratory failure with hypoxia: Secondary | ICD-10-CM

## 2021-04-10 DIAGNOSIS — N400 Enlarged prostate without lower urinary tract symptoms: Secondary | ICD-10-CM

## 2021-04-10 DIAGNOSIS — J690 Pneumonitis due to inhalation of food and vomit: Secondary | ICD-10-CM

## 2021-04-10 DIAGNOSIS — E785 Hyperlipidemia, unspecified: Secondary | ICD-10-CM

## 2021-04-10 DIAGNOSIS — J9602 Acute respiratory failure with hypercapnia: Secondary | ICD-10-CM

## 2021-04-10 LAB — BASIC METABOLIC PANEL
Anion gap: 4 — ABNORMAL LOW (ref 5–15)
BUN: 11 mg/dL (ref 8–23)
CO2: 34 mmol/L — ABNORMAL HIGH (ref 22–32)
Calcium: 8.6 mg/dL — ABNORMAL LOW (ref 8.9–10.3)
Chloride: 94 mmol/L — ABNORMAL LOW (ref 98–111)
Creatinine, Ser: 0.44 mg/dL — ABNORMAL LOW (ref 0.61–1.24)
GFR, Estimated: >60 mL/min (ref >60)
Glucose, Bld: 112 mg/dL — ABNORMAL HIGH (ref 70–99)
Potassium: 3.9 mmol/L (ref 3.5–5.1)
Sodium: 132 mmol/L — ABNORMAL LOW (ref 135–145)

## 2021-04-10 LAB — CBC WITH DIFFERENTIAL/PLATELET
Abs Immature Granulocytes: 0.1 10*3/uL — ABNORMAL HIGH (ref 0.00–0.07)
Basophils Absolute: 0 10*3/uL (ref 0.0–0.1)
Basophils Relative: 0 %
Eosinophils Absolute: 0 10*3/uL (ref 0.0–0.5)
Eosinophils Relative: 0 %
HCT: 33.8 % — ABNORMAL LOW (ref 39.0–52.0)
Hemoglobin: 10.9 g/dL — ABNORMAL LOW (ref 13.0–17.0)
Immature Granulocytes: 1 %
Lymphocytes Relative: 5 %
Lymphs Abs: 0.4 10*3/uL — ABNORMAL LOW (ref 0.7–4.0)
MCH: 28 pg (ref 26.0–34.0)
MCHC: 32.2 g/dL (ref 30.0–36.0)
MCV: 86.9 fL (ref 80.0–100.0)
Monocytes Absolute: 0.3 10*3/uL (ref 0.1–1.0)
Monocytes Relative: 4 %
Neutro Abs: 7.2 10*3/uL (ref 1.7–7.7)
Neutrophils Relative %: 90 %
Platelets: 275 10*3/uL (ref 150–400)
RBC: 3.89 MIL/uL — ABNORMAL LOW (ref 4.22–5.81)
RDW: 13.4 % (ref 11.5–15.5)
WBC: 8 10*3/uL (ref 4.0–10.5)
nRBC: 0 % (ref 0.0–0.2)

## 2021-04-10 LAB — PHOSPHORUS: Phosphorus: 4.5 mg/dL (ref 2.5–4.6)

## 2021-04-10 MED ORDER — IPRATROPIUM-ALBUTEROL 0.5-2.5 (3) MG/3ML IN SOLN
3.0000 mL | Freq: Two times a day (BID) | RESPIRATORY_TRACT | Status: DC
  Administered 2021-04-10 – 2021-04-11 (×2): 3 mL via RESPIRATORY_TRACT
  Filled 2021-04-10 (×3): qty 3

## 2021-04-10 MED ORDER — FUROSEMIDE 20 MG PO TABS
10.0000 mg | ORAL_TABLET | Freq: Every day | ORAL | Status: DC
  Administered 2021-04-10 – 2021-04-11 (×2): 10 mg via ORAL
  Filled 2021-04-10 (×2): qty 1

## 2021-04-10 NOTE — Progress Notes (Signed)
Patient ID: Brandon [name], male   DOB: November 18, 1955, 65 y.o.   MRN: [id number] [facility] PROGRESS NOTE  ADRIK KHIM VOZ:[id number] DOB: 1956/02/15 DOA: 04/03/2021 PCP: Sherron Monday, MD  HPI/Subjective: Patient feeling okay.  Was sitting up in the chair.  Appetite okay.  Advised not to drink too much fluid.  Came in with acute hypoxic respiratory failure intubated bronched and extubated.  Also had hyponatremia.  Patient still feels weak.  Objective: Vitals:   04/10/21 0809 04/10/21 1645  BP: 134/86 121/83  Pulse: 85 88  Resp: 18   Temp: 97.7 F (36.5 C) 97.7 F (36.5 C)  SpO2: 90% 93%    Intake/Output Summary (Last 24 hours) at 04/10/2021 1653 Last data filed at 04/10/2021 1431 Gross per 24 hour  Intake 960 ml  Output 1200 ml  Net -240 ml   Filed Weights   04/03/21 1836 04/06/21 0935  Weight: 82.6 kg 78.7 kg    ROS: Review of Systems  Constitutional:  Positive for malaise/fatigue.  Respiratory:  Negative for cough and shortness of breath.   Cardiovascular:  Negative for chest pain.  Gastrointestinal:  Negative for abdominal pain, nausea and vomiting.  Exam: Physical Exam HENT:     Head: Normocephalic.     Mouth/Throat:     Pharynx: No oropharyngeal exudate.  Eyes:     General: Lids are normal.     Conjunctiva/sclera: Conjunctivae normal.     Pupils: Pupils are equal, round, and reactive to light.  Cardiovascular:     Rate and Rhythm: Normal rate and regular rhythm.     Heart sounds: Normal heart sounds, S1 normal and S2 normal.  Pulmonary:     Breath sounds: Normal breath sounds. No decreased breath sounds, wheezing, rhonchi or rales.  Abdominal:     Palpations: Abdomen is soft.     Tenderness: There is no abdominal tenderness.  Musculoskeletal:     Right lower leg: No swelling.     Left lower leg: No swelling.  Skin:    General: Skin is warm.     Findings: No rash.  Neurological:     Mental Status: He is alert.      Scheduled Meds:   amLODipine  5 mg Oral Daily   atorvastatin  20 mg Oral QHS   budesonide (PULMICORT) nebulizer solution  0.5 mg Nebulization BID   buPROPion ER  100 mg Oral Daily   carvedilol  25 mg Oral BID WC   Chlorhexidine Gluconate Cloth  6 each Topical Q0600   cholecalciferol  1,000 Units Oral Daily   cloZAPine  150 mg Oral QHS   docusate sodium  100 mg Oral BID   dorzolamide-timolol  1 drop Both Eyes BID   enoxaparin (LOVENOX) injection  40 mg Subcutaneous Q24H   ezetimibe  10 mg Oral QHS   famotidine  20 mg Oral Daily   fluconazole  100 mg Oral Daily   furosemide  10 mg Oral Daily   ipratropium-albuterol  3 mL Nebulization BID   irbesartan  37.5 mg Oral Daily   isosorbide mononitrate  30 mg Oral BID   latanoprost  1 drop Both Eyes QHS   mouth rinse  15 mL Mouth Rinse q12n4p   methylPREDNISolone (SOLU-MEDROL) injection  20 mg Intravenous Q12H   pantoprazole  40 mg Oral BID   polyethylene glycol  17 g Per Tube Daily   sodium chloride flush  10-40 mL Intracatheter Q12H   sodium chloride  1 g Oral TID  WC   tamsulosin  0.4 mg Oral Daily   temazepam  7.5 mg Oral QHS   Continuous Infusions:  sodium chloride 10 mL/hr at 04/08/21 1600   ampicillin-sulbactam (UNASYN) IV 3 g (04/10/21 1508)    Assessment/Plan:  Acute hypoxic respiratory failure with aspiration versus mucous plugging.  This has resolved.  Patient was intubated and had a bronchoscopy and extubated on 8/27.  Patient will receive fifth day of antibiotic today and will discontinue. Acute on chronic hyponatremia with generalized weakness.  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  Patient did receive tolvaptan while here in the hospital.  Patient on salt tablets.  We will start low-dose Lasix. Essential hypertension on Norvasc, Coreg and ARB Hyperlipidemia unspecified on  atorvastatin History of CAD on beta-blocker and atorvastatin. BPH on Flomax Schizoaffective disorder, bipolar and history of schizophrenia.  Patient on clozapine Glaucoma continue eyedrops        Code Status:     Code Status Orders  (From admission, onward)           Start     Ordered   04/03/21 2037  Full code  Continuous        04/03/21 2041           Code Status History     Date Active Date Inactive Code Status Order ID Comments User Context   03/22/2021 1851 03/25/2021 2231 Full Code [id number]  Anselm Jungling, DO ED   11/27/2020 2331 12/03/2020 2155 Full Code [id number]  Lilia Pro, MD ED   07/17/2018 1249 07/20/2018 1726 Full Code [id number]  Sung Amabile, DO Inpatient   05/04/2018 2029 05/07/2018 1634 Full Code [id number]  Sung Amabile, DO Inpatient   04/08/2018 2011 04/12/2018 1441 Full Code [id number]  Sung Amabile, DO Inpatient   10/19/2017 1252 10/19/2017 1820 Full Code [id number]  Laurier Nancy, MD Inpatient      Family Communication: Spoke with her brother on the phone Disposition Plan: Status is: Inpatient  Dispo: The patient is from: Rehab              Anticipated d/c is to: Rehab              Patient currently stable to go out to rehab once insurance authorizes   Difficult to place patient.  Hopefully not  Consultants: Critical care specialist  Procedures: Intubated, bronchoscopy, extubated  Antibiotics: Last day of Unasyn  Time spent: 28 minutes  Jovanni [employer]

## 2021-04-10 NOTE — TOC Progression Note (Signed)
Transition of Care [facility]) - Progression Note    Patient Details  Name: Brandon [name] MRN: [id number] Date of Birth: August 31, 1955  [facility]) CM/SW Contact  Barrie Dunker, RN Phone Number: 04/10/2021, 2:32 PM  Clinical Narrative:   Was able to get the PT notes and submit to insurance for approval, ref number [id number], still pending, will await approval to go to [facility]         Expected Discharge Plan and Services                                                 Social Determinants of Health (SDOH) Interventions    Readmission Risk Interventions Readmission Risk Prevention Plan 04/09/2021 03/24/2021  Transportation Screening Complete Complete  PCP or Specialist Appt within 5-7 Days - Complete  Home Care Screening - Complete  Medication Review (RN CM) - Complete  Medication Review (RN Care Manager) Complete -  PCP or Specialist appointment within 3-5 days of discharge Complete -  HRI or Home Care Consult Complete -  SW Recovery Care/Counseling Consult Complete -  Palliative Care Screening Not Applicable -  Skilled Nursing Facility Complete -  Some recent data might be hidden

## 2021-04-10 NOTE — Evaluation (Signed)
Physical Therapy Evaluation Patient Details Name: Brandon [name] MRN: [id number] DOB: 03-11-1956 Today's Date: 04/10/2021   History of Present Illness  Pt is admitted for schizoaffective disorder with complaints of hypoxia and resp distress. HIstory includes chronic hyponatremia, COPD, CAD s/p PCI and stent, HTN, bipolar, and seizures. Recently admitted and dc to [facility] where he was receiving PT services.  Clinical Impression  Pt is a pleasant 65 year old male who was admitted for schizoaffective disorder recently from SNF. Pt performs bed mobility with supervision, transfers with min assist and ambulation with min assist and RW. All mobility performed on RA with sats monitored. With exertion, decreased to 86% however cues for pursed lip breathing with O2 sats improving to 90%. No SOB symptoms noted, although does fatigue quickly with exertion. Pt demonstrates deficits with endurance/mobility/strength. Pt is eager to return back to SNF and is hopeful for short stay prior to dcing back to home environment. Would benefit from skilled PT to address above deficits and promote optimal return to PLOF; recommend transition to STR upon discharge from acute hospitalization.     Follow Up Recommendations SNF    Equipment Recommendations  None recommended by PT    Recommendations for Other Services       Precautions / Restrictions Precautions Precautions: Fall Restrictions Weight Bearing Restrictions: No      Mobility  Bed Mobility Overal bed mobility: Needs Assistance Bed Mobility: Supine to Sit     Supine to sit: Supervision;HOB elevated     General bed mobility comments: use of bed rails    Transfers Overall transfer level: Needs assistance Equipment used: Rolling walker (2 wheeled) Transfers: Sit to/from Stand Sit to Stand: Min assist         General transfer comment: cues for pushing from seated surface. ONce standing, needs cues for B knee extension. RW used for  safety  Ambulation/Gait Ambulation/Gait assistance: Min Chemical engineer (Feet): 5 Feet Assistive device: Rolling walker (2 wheeled) Gait Pattern/deviations: Step-to pattern     General Gait Details: cues for upright posture. Fatigues quickly B LE knees flexed, however can inconsistently correct. RW used  Information systems manager [name] (Stroke Patients Only)       Balance Overall balance assessment: Needs assistance Sitting-balance support: Bilateral upper extremity supported;Feet supported Sitting balance-[name] Scale: Good     Standing balance support: Bilateral upper extremity supported;During functional activity Standing balance-[name] Scale: Fair Standing balance comment: use of RW                             Pertinent Vitals/Pain Pain Assessment: No/denies pain    Home Living Family/patient expects to be discharged to:: Skilled nursing facility Living Arrangements: Alone Available Help at Discharge: Family;Friend(s);Available PRN/intermittently             Additional Comments: previously lived alone, however has been in SNF for past 2 weeks    Prior Function Level of Independence: Independent with assistive device(s)         Comments: Ambulatory with SPC in home, RW in community. Has been using RW at [facility]     Hand Dominance        Extremity/Trunk Assessment   Upper Extremity Assessment Upper Extremity Assessment: Overall WFL for tasks assessed    Lower Extremity Assessment Lower Extremity Assessment: Generalized weakness (B LE grossly 3+/5)  Communication   Communication: No difficulties  Cognition Arousal/Alertness: Awake/alert Behavior During Therapy: WFL for tasks assessed/performed Overall Cognitive Status: Within Functional Limits for tasks assessed                                 General Comments: Pleasant & agreeable to tx.      General Comments       Exercises Other Exercises Other Exercises: seated ther-ex performed on B LE including alt marching, sit<>Stand, and LAQ. 10 reps performed with O2 sats decreasing to 86% with exertion. Cues for pursed lip breathing with sats improving quickly to 90%. HR ranging from 110-112bpm with exertion   Assessment/Plan    PT Assessment Patient needs continued PT services  PT Problem List Decreased strength;Decreased mobility;Decreased balance;Decreased activity tolerance       PT Treatment Interventions DME instruction;Therapeutic activities;Modalities;Gait training;Therapeutic exercise;Patient/family education;Stair training;Balance training;Functional mobility training;Manual techniques;Neuromuscular re-education    PT Goals (Current goals can be found in the Care Plan section)  Acute Rehab PT Goals Patient Stated Goal: get better PT Goal Formulation: With patient Time For Goal Achievement: 04/24/21 Potential to Achieve Goals: Good    Frequency Min 2X/week   Barriers to discharge        Co-evaluation               AM-PAC PT "6 Clicks" Mobility  Outcome Measure Help needed turning from your back to your side while in a flat bed without using bedrails?: A Little Help needed moving from lying on your back to sitting on the side of a flat bed without using bedrails?: A Little Help needed moving to and from a bed to a chair (including a wheelchair)?: A Little Help needed standing up from a chair using your arms (e.g., wheelchair or bedside chair)?: A Little Help needed to walk in hospital room?: A Lot Help needed climbing 3-5 steps with a railing? : A Lot 6 Click Score: 16    End of Session Equipment Utilized During Treatment: Gait belt Activity Tolerance: Patient tolerated treatment well Patient left: in chair;with chair alarm set;with nursing/sitter in room Nurse Communication: Mobility status PT Visit Diagnosis: Unsteadiness on feet (R26.81);Muscle weakness (generalized)  (M62.81);Difficulty in walking, not elsewhere classified (R26.2)    Time: 0912-0929 PT Time Calculation (min) (ACUTE ONLY): 17 min   Charges:   PT Evaluation $PT Eval Low Complexity: 1 Low PT Treatments $Therapeutic Exercise: 8-22 mins        Brandon [name], PT, DPT [phone]   Brandon [name] 04/10/2021, 10:49 AM

## 2021-04-10 NOTE — Care Management Important Message (Signed)
Important Message  Patient Details  Name: Brandon [name] MRN: [id number] Date of Birth: [birth year]   Medicare Important Message Given:  Yes     Olegario Messier A Vershawn Westrup 04/10/2021, 10:14 AM

## 2021-04-11 DIAGNOSIS — I252 Old myocardial infarction: Secondary | ICD-10-CM | POA: Diagnosis not present

## 2021-04-11 DIAGNOSIS — Z743 Need for continuous supervision: Secondary | ICD-10-CM | POA: Diagnosis not present

## 2021-04-11 DIAGNOSIS — J9601 Acute respiratory failure with hypoxia: Secondary | ICD-10-CM | POA: Diagnosis not present

## 2021-04-11 DIAGNOSIS — I10 Essential (primary) hypertension: Secondary | ICD-10-CM | POA: Diagnosis not present

## 2021-04-11 DIAGNOSIS — J441 Chronic obstructive pulmonary disease with (acute) exacerbation: Secondary | ICD-10-CM | POA: Diagnosis not present

## 2021-04-11 DIAGNOSIS — K5900 Constipation, unspecified: Secondary | ICD-10-CM | POA: Diagnosis not present

## 2021-04-11 DIAGNOSIS — R0602 Shortness of breath: Secondary | ICD-10-CM | POA: Diagnosis not present

## 2021-04-11 DIAGNOSIS — G9349 Other encephalopathy: Secondary | ICD-10-CM | POA: Diagnosis not present

## 2021-04-11 DIAGNOSIS — R410 Disorientation, unspecified: Secondary | ICD-10-CM | POA: Diagnosis not present

## 2021-04-11 DIAGNOSIS — R2681 Unsteadiness on feet: Secondary | ICD-10-CM | POA: Diagnosis not present

## 2021-04-11 DIAGNOSIS — Z882 Allergy status to sulfonamides status: Secondary | ICD-10-CM | POA: Diagnosis not present

## 2021-04-11 DIAGNOSIS — Z955 Presence of coronary angioplasty implant and graft: Secondary | ICD-10-CM | POA: Diagnosis not present

## 2021-04-11 DIAGNOSIS — R404 Transient alteration of awareness: Secondary | ICD-10-CM | POA: Diagnosis not present

## 2021-04-11 DIAGNOSIS — I4891 Unspecified atrial fibrillation: Secondary | ICD-10-CM | POA: Diagnosis not present

## 2021-04-11 DIAGNOSIS — Z20822 Contact with and (suspected) exposure to covid-19: Secondary | ICD-10-CM | POA: Diagnosis not present

## 2021-04-11 DIAGNOSIS — J449 Chronic obstructive pulmonary disease, unspecified: Secondary | ICD-10-CM

## 2021-04-11 DIAGNOSIS — F250 Schizoaffective disorder, bipolar type: Secondary | ICD-10-CM | POA: Diagnosis present

## 2021-04-11 DIAGNOSIS — K219 Gastro-esophageal reflux disease without esophagitis: Secondary | ICD-10-CM | POA: Diagnosis not present

## 2021-04-11 DIAGNOSIS — D509 Iron deficiency anemia, unspecified: Secondary | ICD-10-CM | POA: Diagnosis not present

## 2021-04-11 DIAGNOSIS — E876 Hypokalemia: Secondary | ICD-10-CM | POA: Diagnosis not present

## 2021-04-11 DIAGNOSIS — I2510 Atherosclerotic heart disease of native coronary artery without angina pectoris: Secondary | ICD-10-CM | POA: Diagnosis not present

## 2021-04-11 DIAGNOSIS — F32A Depression, unspecified: Secondary | ICD-10-CM | POA: Diagnosis not present

## 2021-04-11 DIAGNOSIS — Z79899 Other long term (current) drug therapy: Secondary | ICD-10-CM | POA: Diagnosis not present

## 2021-04-11 DIAGNOSIS — N400 Enlarged prostate without lower urinary tract symptoms: Secondary | ICD-10-CM | POA: Diagnosis present

## 2021-04-11 DIAGNOSIS — Z9049 Acquired absence of other specified parts of digestive tract: Secondary | ICD-10-CM | POA: Diagnosis not present

## 2021-04-11 DIAGNOSIS — J9602 Acute respiratory failure with hypercapnia: Secondary | ICD-10-CM | POA: Diagnosis not present

## 2021-04-11 DIAGNOSIS — E871 Hypo-osmolality and hyponatremia: Secondary | ICD-10-CM | POA: Diagnosis not present

## 2021-04-11 DIAGNOSIS — R531 Weakness: Secondary | ICD-10-CM | POA: Diagnosis not present

## 2021-04-11 DIAGNOSIS — Z91041 Radiographic dye allergy status: Secondary | ICD-10-CM | POA: Diagnosis not present

## 2021-04-11 DIAGNOSIS — R0603 Acute respiratory distress: Secondary | ICD-10-CM | POA: Diagnosis not present

## 2021-04-11 DIAGNOSIS — R0689 Other abnormalities of breathing: Secondary | ICD-10-CM | POA: Diagnosis not present

## 2021-04-11 DIAGNOSIS — J440 Chronic obstructive pulmonary disease with acute lower respiratory infection: Secondary | ICD-10-CM | POA: Diagnosis not present

## 2021-04-11 DIAGNOSIS — F419 Anxiety disorder, unspecified: Secondary | ICD-10-CM | POA: Diagnosis present

## 2021-04-11 DIAGNOSIS — J189 Pneumonia, unspecified organism: Secondary | ICD-10-CM | POA: Diagnosis present

## 2021-04-11 DIAGNOSIS — K2100 Gastro-esophageal reflux disease with esophagitis, without bleeding: Secondary | ICD-10-CM | POA: Diagnosis not present

## 2021-04-11 DIAGNOSIS — E785 Hyperlipidemia, unspecified: Secondary | ICD-10-CM | POA: Diagnosis not present

## 2021-04-11 DIAGNOSIS — I209 Angina pectoris, unspecified: Secondary | ICD-10-CM | POA: Diagnosis not present

## 2021-04-11 DIAGNOSIS — Z888 Allergy status to other drugs, medicaments and biological substances status: Secondary | ICD-10-CM | POA: Diagnosis not present

## 2021-04-11 DIAGNOSIS — J690 Pneumonitis due to inhalation of food and vomit: Secondary | ICD-10-CM | POA: Diagnosis not present

## 2021-04-11 DIAGNOSIS — H4010X0 Unspecified open-angle glaucoma, stage unspecified: Secondary | ICD-10-CM | POA: Diagnosis not present

## 2021-04-11 DIAGNOSIS — R6889 Other general symptoms and signs: Secondary | ICD-10-CM | POA: Diagnosis not present

## 2021-04-11 DIAGNOSIS — M6281 Muscle weakness (generalized): Secondary | ICD-10-CM | POA: Diagnosis not present

## 2021-04-11 DIAGNOSIS — R4020 Unspecified coma: Secondary | ICD-10-CM | POA: Diagnosis not present

## 2021-04-11 DIAGNOSIS — R5381 Other malaise: Secondary | ICD-10-CM | POA: Diagnosis not present

## 2021-04-11 DIAGNOSIS — R61 Generalized hyperhidrosis: Secondary | ICD-10-CM | POA: Diagnosis not present

## 2021-04-11 DIAGNOSIS — Z9151 Personal history of suicidal behavior: Secondary | ICD-10-CM | POA: Diagnosis not present

## 2021-04-11 LAB — BASIC METABOLIC PANEL
Anion gap: 5 (ref 5–15)
BUN: 12 mg/dL (ref 8–23)
CO2: 34 mmol/L — ABNORMAL HIGH (ref 22–32)
Calcium: 8.7 mg/dL — ABNORMAL LOW (ref 8.9–10.3)
Chloride: 95 mmol/L — ABNORMAL LOW (ref 98–111)
Creatinine, Ser: 0.55 mg/dL — ABNORMAL LOW (ref 0.61–1.24)
GFR, Estimated: >60 mL/min (ref >60)
Glucose, Bld: 110 mg/dL — ABNORMAL HIGH (ref 70–99)
Potassium: 3.9 mmol/L (ref 3.5–5.1)
Sodium: 134 mmol/L — ABNORMAL LOW (ref 135–145)

## 2021-04-11 LAB — RESP PANEL BY RT-PCR (FLU A&B, COVID) ARPGX2
Influenza A by PCR: NEGATIVE
Influenza B by PCR: NEGATIVE
SARS Coronavirus 2 by RT PCR: NEGATIVE

## 2021-04-11 MED ORDER — DOCUSATE SODIUM 100 MG PO CAPS: 100.0000 mg | ORAL_CAPSULE | Freq: Two times a day (BID) | ORAL | 0 refills | Status: AC

## 2021-04-11 MED ORDER — FUROSEMIDE 20 MG PO TABS: 10.0000 mg | ORAL_TABLET | Freq: Every day | ORAL | 0 refills | Status: AC

## 2021-04-11 MED ORDER — AMOXICILLIN-POT CLAVULANATE 875-125 MG PO TABS
1.0000 | ORAL_TABLET | Freq: Once | ORAL | Status: AC
  Administered 2021-04-11: 1 via ORAL
  Filled 2021-04-11: qty 1

## 2021-04-11 MED ORDER — ALBUTEROL SULFATE HFA 108 (90 BASE) MCG/ACT IN AERS: 2.0000 | INHALATION_SPRAY | Freq: Four times a day (QID) | RESPIRATORY_TRACT | 0 refills | Status: DC | PRN

## 2021-04-11 MED ORDER — ATORVASTATIN CALCIUM 20 MG PO TABS: 20.0000 mg | ORAL_TABLET | Freq: Every day | ORAL | 0 refills | Status: DC

## 2021-04-11 MED ORDER — GUAIFENESIN ER 600 MG PO TB12: 600.0000 mg | ORAL_TABLET | Freq: Two times a day (BID) | ORAL | 0 refills | Status: AC

## 2021-04-11 MED ORDER — GUAIFENESIN ER 600 MG PO TB12
600.0000 mg | ORAL_TABLET | Freq: Two times a day (BID) | ORAL | Status: DC
  Administered 2021-04-11: 600 mg via ORAL
  Filled 2021-04-11: qty 1

## 2021-04-11 MED ORDER — TRAZODONE HCL 50 MG PO TABS: 25.0000 mg | ORAL_TABLET | Freq: Every evening | ORAL | 0 refills | Status: DC | PRN

## 2021-04-11 MED ORDER — HYDROXYZINE PAMOATE 25 MG PO CAPS: 25.0000 mg | ORAL_CAPSULE | Freq: Three times a day (TID) | ORAL | 0 refills | Status: AC | PRN

## 2021-04-11 NOTE — Progress Notes (Signed)
Chaplain Maggie responded to page to beside per pt request to talk with chaplain. Engaged in spiritual conversation, read from [place] [facility] and prayed traditional prayers together. Pt appreciated visitation and expressed gratitude. The mystery of faith requiring trust and acceptance transcended this clinical encounter.

## 2021-04-11 NOTE — Discharge Summary (Signed)
[facility] NAME: Brandon [name]    MR#:  [id number]  DATE OF BIRTH:  1955-08-18  DATE OF ADMISSION:  04/03/2021 ADMITTING PHYSICIAN: Christel Mormon, MD  DATE OF DISCHARGE: 04/11/2021  PRIMARY CARE PHYSICIAN: Jodi Marble, MD    ADMISSION DIAGNOSIS:  Hyponatremia [E87.1]  DISCHARGE DIAGNOSIS:  Principal Problem:   Schizoaffective disorder, bipolar type ([place]) Active Problems:   Hyponatremia   Lobar pneumonia, unspecified organism ([place])   Acute respiratory failure with hypoxia (HCC)   Aspiration pneumonia of both lungs due to gastric secretions ([place])   SECONDARY DIAGNOSIS:   Past Medical History:  Diagnosis Date  . Anemia    IDA  . Colon polyps   . COPD (chronic obstructive pulmonary disease) ([place])   . Coronary artery disease   . Heart attack ([place]) 2005  . Hiatal hernia   . History of ETOH abuse   . Hyperlipidemia   . Hypertension   . Pleurisy   . Schizophrenia ([place])   . Schizophrenia ([place])   . Seizures ([place])    grand mal in 2000 or 2001 recovery from alcoholism  . Suicide attempt [facility])     HOSPITAL COURSE:   Acute hypoxic respiratory failure with aspiration versus mucous plugging.  The patient was intubated and had a bronchoscopy and extubated on 04/06/2021.  Patient completed 5 days of antibiotics.  Bronchoscopy culture showed some gram-positive cocci but no staff aureus.  Nursing staff to check ambulating oxygen.  At rest is oxygenating fine. Acute on chronic hyponatremia with generalized weakness.  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  The patient did receive his salt tablets and also tolvaptan while here in the hospital.  I started some low-dose Lasix.  Last sodium 134.  At its lowest point sodium went down to 115.   Recommend checking a BMP in 1 week.  CT scan of the chest abdomen pelvis did not show any signs of malignancy but did recommend a follow-up radiograph of the lungs in 6 to 8 weeks to ensure resolution. Essential hypertension on Norvasc Coreg and ARB Hyperlipidemia unspecified on atorvastatin History of CAD on beta-blocker and atorvastatin BPH on Flomax COPD on Symbicort and added albuterol inhaler as needed.  Patient wanted some Mucinex also. Glaucoma continue eyedrops. CT scan did show dependent layering hyperdensity within the urinary bladder which may represent hemorrhage or possible tiny bladder stones.  I recommended correlating with urinalysis for hematuria.  Patient had 2 urine analysis on this hospital stay which were negative for blood.  Can consider cystoscopy as outpatient if any symptoms.  DISCHARGE CONDITIONS:   Satisfactorily  CONSULTS OBTAINED:  Critical care specialist Psychiatry  DRUG ALLERGIES:   Allergies  Allergen Reactions  . Compazine [Prochlorperazine] Other (See Comments)    Dystonic rxn - convulsions. Near fatal reaction.   Clementeen Hoof [Iodinated Diagnostic Agents] Shortness Of Breath    Pt states SOB after last IV contrast injection  . Alcohol-Sulfur [Elemental Sulfur] Other (See Comments)    History of alcoholism  . Benadryl [Diphenhydramine] Other (See Comments)    "stuffy", nasal congestion  . Depakote [Divalproex Sodium] Other (See Comments)    Cause elevated ammonia  . Dramamine [Dimenhydrinate] Swelling  . Plavix [Clopidogrel] Other (See Comments)    Rectal bleeding. "  Perforated my intestines."  . Rosuvastatin     Other reaction(s): Other (See Comments)    DISCHARGE MEDICATIONS:   Allergies as of 04/11/2021       Reactions   Compazine [prochlorperazine] Other (See Comments)   Dystonic rxn - convulsions. Near fatal reaction.   Ivp Dye [iodinated Diagnostic Agents] Shortness Of Breath   Pt states SOB after last IV contrast injection    Alcohol-sulfur [elemental Sulfur] Other (See Comments)   History of alcoholism   Benadryl [diphenhydramine] Other (See Comments)   "stuffy", nasal congestion   Depakote [divalproex Sodium] Other (See Comments)   Cause elevated ammonia   Dramamine [dimenhydrinate] Swelling   Plavix [clopidogrel] Other (See Comments)   Rectal bleeding. "Perforated my intestines."   Rosuvastatin    Other reaction(s): Other (See Comments)        Medication List     STOP taking these medications    simvastatin 40 MG tablet Commonly known as: ZOCOR       TAKE these medications    acetaminophen 325 MG tablet Commonly known as: TYLENOL Take 650 mg by mouth every 4 (four) hours as needed for mild pain or moderate pain.   albuterol 108 (90 Base) MCG/ACT inhaler Commonly known as: VENTOLIN HFA Inhale 2 puffs into the lungs every 6 (six) hours as needed for wheezing or shortness of breath.   amLODipine 5 MG tablet Commonly known as: NORVASC Take 1 tablet (5 mg total) by mouth daily.   atorvastatin 20 MG tablet Commonly known as: LIPITOR Take 1 tablet (20 mg total) by mouth at bedtime.   budesonide-formoterol 80-4.5 MCG/ACT inhaler Commonly known as: SYMBICORT Inhale 2 puffs into the lungs 2 (two) times daily.   buPROPion ER 100 MG 12 hr tablet Commonly known as: WELLBUTRIN SR Take 100 mg by mouth daily.   carvedilol 25 MG tablet Commonly known as: COREG Take 25 mg by mouth 2 (two) times daily with a meal.   cloZAPine 100 MG tablet Commonly known as: CLOZARIL Take 150 mg by mouth at bedtime.   docusate sodium 100 MG capsule Commonly known as: COLACE Take 1 capsule (100 mg total) by mouth 2 (two) times daily. Hold if has diarrhea   dorzolamide-timolol 22.3-6.8 MG/ML ophthalmic solution Commonly known as: COSOPT Place 1 drop into both eyes 2 (two) times daily.   ezetimibe 10 MG tablet Commonly known as: ZETIA Take 10 mg by mouth at bedtime.   furosemide 20 MG tablet Commonly  known as: LASIX Take 0.5 tablets (10 mg total) by mouth daily. Start taking on: April 12, 2021   guaiFENesin 600 MG 12 hr tablet Commonly known as: MUCINEX Take 1 tablet (600 mg total) by mouth 2 (two) times daily for 14 days.   hydrOXYzine 25 MG capsule Commonly known as: VISTARIL Take 1 capsule (25 mg total) by mouth 3 (three) times daily as needed for anxiety. What changed: how much to take   isosorbide mononitrate 30 MG 24 hr tablet Commonly known as: IMDUR Take 30 mg by mouth 2 (two) times daily.   latanoprost 0.005 % ophthalmic solution Commonly known as: XALATAN Place 1 drop into both eyes at bedtime.   nitroGLYCERIN 0.4 MG SL tablet Commonly known as: NITROSTAT Place 0.4 mg under the tongue every 5 (five) minutes as needed for chest pain.   olmesartan 20 MG tablet Commonly known as: BENICAR Take 20 mg by mouth daily.   pantoprazole 40 MG tablet Commonly known as: PROTONIX Take 40 mg by mouth  2 (two) times daily.   senna 8.6 MG tablet Commonly known as: SENOKOT Take 1-2 tablets by mouth daily as needed for constipation.   sodium chloride 1 g tablet Take 1 tablet (1 g total) by mouth 3 (three) times daily with meals.   tamsulosin 0.4 MG Caps capsule Commonly known as: FLOMAX Take 0.4 mg by mouth daily.   traZODone 50 MG tablet Commonly known as: DESYREL Take 0.5 tablets (25 mg total) by mouth at bedtime as needed for sleep.   Vitamin D3 25 MCG tablet Commonly known as: Vitamin D Take 1 tablet (1,000 Units total) by mouth daily.         DISCHARGE INSTRUCTIONS:   Follow-up team at rehab 1 day  If you experience worsening of your admission symptoms, develop shortness of breath, life threatening emergency, suicidal or homicidal thoughts you must seek medical attention immediately by calling 911 or calling your MD immediately  if symptoms less severe.  You Must read complete instructions/literature along with all the possible adverse reactions/side  effects for all the Medicines you take and that have been prescribed to you. Take any new Medicines after you have completely understood and accept all the possible adverse reactions/side effects.   Please note  You were cared for by a hospitalist during your hospital stay. If you have any questions about your discharge medications or the care you received while you were in the hospital after you are discharged, you can call the unit and asked to speak with the hospitalist on call if the hospitalist that took care of you is not available. Once you are discharged, your primary care physician will handle any further medical issues. Please note that NO REFILLS for any discharge medications will be authorized once you are discharged, as it is imperative that you return to your primary care physician (or establish a relationship with a primary care physician if you do not have one) for your aftercare needs so that they can reassess your need for medications and monitor your lab values.    Today   CHIEF COMPLAINT:   Chief Complaint  Patient presents with  . abnormal labs  . Weakness    HISTORY OF PRESENT ILLNESS:  Brandon [name]  is a 65 y.o. male sent in with weakness and hyponatremia   VITAL SIGNS:  Blood pressure (!) 145/92, pulse 88, temperature 98.2 F (36.8 C), resp. rate 18, height 6' (1.829 m), weight 78.7 kg, SpO2 90 %.   PHYSICAL EXAMINATION:  GENERAL:  65 y.o.-year-old patient lying in the bed with no acute distress.  EYES: Pupils equal, round, reactive to light and accommodation. No scleral icterus. Extraocular muscles intact.  HEENT: Head atraumatic, normocephalic. Oropharynx and nasopharynx clear.   LUNGS: Normal breath sounds bilaterally, no wheezing, rales,rhonchi or crepitation. No use of accessory muscles of respiration.  CARDIOVASCULAR: S1, S2 normal. No murmurs, rubs, or gallops.  ABDOMEN: Soft, non-tender, non-distended.  EXTREMITIES: No pedal edema.  NEUROLOGIC:  Cranial nerves II through XII are intact. Muscle strength 5/5 in all extremities.  PSYCHIATRIC: The patient is alert and oriented x 3.  SKIN: No obvious rash, lesion, or ulcer.   DATA REVIEW:   CBC Recent Labs  Lab 04/10/21 0401  WBC 8.0  HGB 10.9*  HCT 33.8*  PLT 275    Chemistries  Recent Labs  Lab 04/05/21 1715 04/05/21 2018 04/07/21 0202 04/07/21 0628 04/11/21 0455  NA 115*   < > 125*   < > 134*  K 3.5   < >  3.8   < > 3.9  CL 80*   < > 84*   < > 95*  CO2 28   < > 31   < > 34*  GLUCOSE 101*   < > 125*   < > 110*  BUN 6*   < > 13   < > 12  CREATININE 0.31*   < > 0.92   < > 0.55*  CALCIUM 7.8*   < > 8.5*   < > 8.7*  MG  --    < > 1.7  --   --   AST 14*  --   --   --   --   ALT 20  --   --   --   --   ALKPHOS 60  --   --   --   --   BILITOT 0.7  --   --   --   --    < > = values in this interval not displayed.     Microbiology Results  Results for orders placed or performed during the hospital encounter of 04/03/21  MRSA Next Gen by PCR, Nasal     Status: None   Collection Time: 04/04/21  6:20 AM   Specimen: Nasal Mucosa; Nasal Swab  Result Value Ref Range Status   MRSA by PCR Next Gen NOT DETECTED NOT DETECTED Final    Comment: (NOTE) The GeneXpert MRSA Assay (FDA approved for NASAL specimens only), is one component of a comprehensive MRSA colonization surveillance program. It is not intended to diagnose MRSA infection nor to guide or monitor treatment for MRSA infections. Test performance is not FDA approved in patients less than 24 years old. Performed at [facility], [address] [place], [place] [address]   Resp Panel by RT-PCR (Flu A&B, Covid)     Status: None   Collection Time: 04/06/21  9:24 AM  Result Value Ref Range Status   SARS Coronavirus 2 by RT PCR NEGATIVE NEGATIVE Final    Comment: (NOTE) SARS-CoV-2 target nucleic acids are NOT DETECTED.  The SARS-CoV-2 RNA is generally detectable in upper respiratory specimens during the  acute phase of infection. The lowest concentration of SARS-CoV-2 viral copies this assay can detect is 138 copies/mL. A negative result does not preclude SARS-Cov-2 infection and should not be used as the sole basis for treatment or other patient management decisions. A negative result may occur with  improper specimen collection/handling, submission of specimen other than nasopharyngeal swab, presence of viral mutation(s) within the areas targeted by this assay, and inadequate number of viral copies(<138 copies/mL). A negative result must be combined with clinical observations, patient history, and epidemiological information. The expected result is Negative.  Fact Sheet for Patients:  EntrepreneurPulse.com.au  Fact Sheet for Healthcare Providers:  IncredibleEmployment.be  This test is no t yet approved or cleared by the [place] FDA and  has been authorized for detection and/or diagnosis of SARS-CoV-2 by FDA under an Emergency Use Authorization (EUA). This EUA will remain  in effect (meaning this test can be used) for the duration of the COVID-19 declaration under Section 564(b)(1) of the Act, 21 U.S.C.section 360bbb-3(b)(1), unless the authorization is terminated  or revoked sooner.       Influenza A by PCR NEGATIVE NEGATIVE Final   Influenza B by PCR NEGATIVE NEGATIVE Final    Comment: (NOTE) The Xpert Xpress SARS-CoV-2/FLU/RSV plus assay is intended as an aid in the diagnosis of influenza from Nasopharyngeal swab specimens and  should not be used as a sole basis for treatment. Nasal washings and aspirates are unacceptable for Xpert Xpress SARS-CoV-2/FLU/RSV testing.  Fact Sheet for Patients: EntrepreneurPulse.com.au  Fact Sheet for Healthcare Providers: IncredibleEmployment.be  This test is not yet approved or cleared by the [place] FDA and has been authorized for detection and/or  diagnosis of SARS-CoV-2 by FDA under an Emergency Use Authorization (EUA). This EUA will remain in effect (meaning this test can be used) for the duration of the COVID-19 declaration under Section 564(b)(1) of the Act, 21 U.S.C. section 360bbb-3(b)(1), unless the authorization is terminated or revoked.  Performed at [facility], [address] [place], [place] [address]   Culture, Respiratory w Gram Stain     Status: None   Collection Time: 04/06/21 12:58 PM   Specimen: Bronchoalveolar Lavage; Respiratory  Result Value Ref Range Status   Specimen Description   Final    BRONCHIAL ALVEOLAR LAVAGE Performed at [facility] [address] [place], [place] [address]    Special Requests   Final    NONE Performed at [facility], [place], [place] [address]    Gram Stain   Final    FEW SQUAMOUS EPITHELIAL CELLS PRESENT RARE WBC SEEN MODERATE GRAM POSITIVE COCCI    Culture   Final    FEW Consistent with normal respiratory flora. NO STAPHYLOCOCCUS AUREUS ISOLATED Performed at [facility], [place] [address], [place], [place] [address]    Report Status 04/09/2021 FINAL  Final      Management plans discussed with the patient, family and they are in agreement.  CODE STATUS:     Code Status Orders  (From admission, onward)           Start     Ordered   04/03/21 2037  Full code  Continuous        04/03/21 2041           Code Status History     Date Active Date Inactive Code Status Order ID Comments User Context   03/22/2021 1851 03/25/2021 2231 Full Code [id number]  Orene Desanctis, DO ED   11/27/2020 2331 12/03/2020 2155 Full Code [id number]  Artist Beach, MD ED   07/17/2018 1249 07/20/2018 1726 Full Code [id number]  Benjamine Sprague, DO Inpatient   05/04/2018 2029 05/07/2018 1634 Full Code [id number]  Benjamine Sprague, DO Inpatient   04/08/2018 2011 04/12/2018 1441 Full Code [id number]  Benjamine Sprague, DO Inpatient   10/19/2017 1252  10/19/2017 1820 Full Code [id number]  Dionisio David, MD Inpatient       TOTAL TIME TAKING CARE OF THIS PATIENT: 34 minutes.    Loletha Grayer M.D on 04/11/2021 at 11:53 AM  [facility]  CC: Primary care physician; Jodi Marble, MD

## 2021-04-11 NOTE — Progress Notes (Signed)
Report called to [facility]. Pt waiting on transportation

## 2021-04-11 NOTE — Progress Notes (Signed)
Patient alert and oriented x 4, denies pain with assess. Transport arrived at 2000, vitals obtained and patient stable. Belongings and discharge paperwork provided to transport staff. Report called to nurse Tammy, at [facility].

## 2021-04-11 NOTE — TOC Progression Note (Signed)
Transition of Care [facility]) - Progression Note    Patient Details  Name: ANDERSEN IORIO MRN: [id number] Date of Birth: 11-18-1955  Transition of Care [facility]) CM/SW Contact  Barrie Dunker, RN Phone Number: 04/11/2021, 10:47 AM  Clinical Narrative:     Insurance approved 9/1- 9/6 A [id number] to go to [facility]       Expected Discharge Plan and Services                                                 Social Determinants of Health (SDOH) Interventions    Readmission Risk Interventions Readmission Risk Prevention Plan 04/09/2021 03/24/2021  Transportation Screening Complete Complete  PCP or Specialist Appt within 5-7 Days - Complete  Home Care Screening - Complete  Medication Review (RN CM) - Complete  Medication Review (RN Care Manager) Complete -  PCP or Specialist appointment within 3-5 days of discharge Complete -  HRI or Home Care Consult Complete -  SW Recovery Care/Counseling Consult Complete -  Palliative Care Screening Not Applicable -  Skilled Nursing Facility Complete -  Some recent data might be hidden

## 2021-04-11 NOTE — TOC Progression Note (Signed)
Transition of Care [facility]) - Progression Note    Patient Details  Name: Brandon [name] MRN: [id number] Date of Birth: 09/15/1955  [facility]) CM/SW Contact  Barrie Dunker, RN Phone Number: 04/11/2021, 2:59 PM  Clinical Narrative:     Called EMS to transport to room 610B at [facility], he is 3rd on the list, DC packet on the chart       Expected Discharge Plan and Services           Expected Discharge Date: 04/11/21                                     Social Determinants of Health (SDOH) Interventions    Readmission Risk Interventions Readmission Risk Prevention Plan 04/09/2021 03/24/2021  Transportation Screening Complete Complete  PCP or Specialist Appt within 5-7 Days - Complete  Home Care Screening - Complete  Medication Review (RN CM) - Complete  Medication Review (RN Care Manager) Complete -  PCP or Specialist appointment within 3-5 days of discharge Complete -  HRI or Home Care Consult Complete -  SW Recovery Care/Counseling Consult Complete -  Palliative Care Screening Not Applicable -  Skilled Nursing Facility Complete -  Some recent data might be hidden

## 2021-04-12 ENCOUNTER — Inpatient Hospital Stay
Admission: EM | Admit: 2021-04-12 | Discharge: 2021-04-17 | DRG: 190 | Disposition: A | Discharge status: Skilled Nursing Facility | Payer: Medicare Other | Source: Skilled Nursing Facility | Attending: Internal Medicine | Admitting: Internal Medicine

## 2021-04-12 ENCOUNTER — Other Ambulatory Visit: Payer: Self-pay

## 2021-04-12 ENCOUNTER — Emergency Department: Payer: Medicare Other

## 2021-04-12 DIAGNOSIS — I10 Essential (primary) hypertension: Secondary | ICD-10-CM | POA: Diagnosis present

## 2021-04-12 DIAGNOSIS — Z79899 Other long term (current) drug therapy: Secondary | ICD-10-CM

## 2021-04-12 DIAGNOSIS — R6889 Other general symptoms and signs: Secondary | ICD-10-CM | POA: Diagnosis not present

## 2021-04-12 DIAGNOSIS — W19XXXA Unspecified fall, initial encounter: Secondary | ICD-10-CM | POA: Diagnosis not present

## 2021-04-12 DIAGNOSIS — Z888 Allergy status to other drugs, medicaments and biological substances status: Secondary | ICD-10-CM | POA: Diagnosis not present

## 2021-04-12 DIAGNOSIS — N400 Enlarged prostate without lower urinary tract symptoms: Secondary | ICD-10-CM | POA: Diagnosis present

## 2021-04-12 DIAGNOSIS — J9602 Acute respiratory failure with hypercapnia: Secondary | ICD-10-CM | POA: Diagnosis not present

## 2021-04-12 DIAGNOSIS — I2510 Atherosclerotic heart disease of native coronary artery without angina pectoris: Secondary | ICD-10-CM | POA: Diagnosis present

## 2021-04-12 DIAGNOSIS — G9349 Other encephalopathy: Secondary | ICD-10-CM | POA: Diagnosis not present

## 2021-04-12 DIAGNOSIS — J9601 Acute respiratory failure with hypoxia: Secondary | ICD-10-CM

## 2021-04-12 DIAGNOSIS — Z9151 Personal history of suicidal behavior: Secondary | ICD-10-CM | POA: Diagnosis not present

## 2021-04-12 DIAGNOSIS — R262 Difficulty in walking, not elsewhere classified: Secondary | ICD-10-CM | POA: Diagnosis not present

## 2021-04-12 DIAGNOSIS — J441 Chronic obstructive pulmonary disease with (acute) exacerbation: Secondary | ICD-10-CM | POA: Diagnosis not present

## 2021-04-12 DIAGNOSIS — R0602 Shortness of breath: Secondary | ICD-10-CM

## 2021-04-12 DIAGNOSIS — R4189 Other symptoms and signs involving cognitive functions and awareness: Secondary | ICD-10-CM

## 2021-04-12 DIAGNOSIS — E876 Hypokalemia: Secondary | ICD-10-CM | POA: Diagnosis present

## 2021-04-12 DIAGNOSIS — Z20822 Contact with and (suspected) exposure to covid-19: Secondary | ICD-10-CM | POA: Diagnosis not present

## 2021-04-12 DIAGNOSIS — Z91041 Radiographic dye allergy status: Secondary | ICD-10-CM | POA: Diagnosis not present

## 2021-04-12 DIAGNOSIS — F32A Depression, unspecified: Secondary | ICD-10-CM | POA: Diagnosis not present

## 2021-04-12 DIAGNOSIS — R498 Other voice and resonance disorders: Secondary | ICD-10-CM | POA: Diagnosis not present

## 2021-04-12 DIAGNOSIS — H4010X0 Unspecified open-angle glaucoma, stage unspecified: Secondary | ICD-10-CM | POA: Diagnosis not present

## 2021-04-12 DIAGNOSIS — J984 Other disorders of lung: Secondary | ICD-10-CM | POA: Diagnosis not present

## 2021-04-12 DIAGNOSIS — F250 Schizoaffective disorder, bipolar type: Secondary | ICD-10-CM | POA: Diagnosis present

## 2021-04-12 DIAGNOSIS — K219 Gastro-esophageal reflux disease without esophagitis: Secondary | ICD-10-CM | POA: Diagnosis not present

## 2021-04-12 DIAGNOSIS — Z9049 Acquired absence of other specified parts of digestive tract: Secondary | ICD-10-CM | POA: Diagnosis not present

## 2021-04-12 DIAGNOSIS — Z882 Allergy status to sulfonamides status: Secondary | ICD-10-CM

## 2021-04-12 DIAGNOSIS — R61 Generalized hyperhidrosis: Secondary | ICD-10-CM | POA: Diagnosis not present

## 2021-04-12 DIAGNOSIS — M6281 Muscle weakness (generalized): Secondary | ICD-10-CM | POA: Diagnosis not present

## 2021-04-12 DIAGNOSIS — Z7951 Long term (current) use of inhaled steroids: Secondary | ICD-10-CM

## 2021-04-12 DIAGNOSIS — Z8249 Family history of ischemic heart disease and other diseases of the circulatory system: Secondary | ICD-10-CM

## 2021-04-12 DIAGNOSIS — E871 Hypo-osmolality and hyponatremia: Secondary | ICD-10-CM | POA: Diagnosis not present

## 2021-04-12 DIAGNOSIS — E785 Hyperlipidemia, unspecified: Secondary | ICD-10-CM | POA: Diagnosis not present

## 2021-04-12 DIAGNOSIS — R531 Weakness: Secondary | ICD-10-CM

## 2021-04-12 DIAGNOSIS — R0689 Other abnormalities of breathing: Secondary | ICD-10-CM | POA: Diagnosis not present

## 2021-04-12 DIAGNOSIS — R4020 Unspecified coma: Secondary | ICD-10-CM | POA: Diagnosis not present

## 2021-04-12 DIAGNOSIS — J189 Pneumonia, unspecified organism: Secondary | ICD-10-CM | POA: Diagnosis present

## 2021-04-12 DIAGNOSIS — K2100 Gastro-esophageal reflux disease with esophagitis, without bleeding: Secondary | ICD-10-CM | POA: Diagnosis not present

## 2021-04-12 DIAGNOSIS — Z955 Presence of coronary angioplasty implant and graft: Secondary | ICD-10-CM | POA: Diagnosis not present

## 2021-04-12 DIAGNOSIS — R0902 Hypoxemia: Secondary | ICD-10-CM | POA: Diagnosis not present

## 2021-04-12 DIAGNOSIS — J9691 Respiratory failure, unspecified with hypoxia: Secondary | ICD-10-CM | POA: Diagnosis present

## 2021-04-12 DIAGNOSIS — J9811 Atelectasis: Secondary | ICD-10-CM | POA: Diagnosis not present

## 2021-04-12 DIAGNOSIS — J440 Chronic obstructive pulmonary disease with acute lower respiratory infection: Secondary | ICD-10-CM | POA: Diagnosis not present

## 2021-04-12 DIAGNOSIS — R404 Transient alteration of awareness: Secondary | ICD-10-CM | POA: Diagnosis not present

## 2021-04-12 DIAGNOSIS — Z7401 Bed confinement status: Secondary | ICD-10-CM | POA: Diagnosis not present

## 2021-04-12 DIAGNOSIS — R410 Disorientation, unspecified: Secondary | ICD-10-CM | POA: Diagnosis not present

## 2021-04-12 DIAGNOSIS — Z89422 Acquired absence of other left toe(s): Secondary | ICD-10-CM

## 2021-04-12 DIAGNOSIS — Z743 Need for continuous supervision: Secondary | ICD-10-CM | POA: Diagnosis not present

## 2021-04-12 DIAGNOSIS — F419 Anxiety disorder, unspecified: Secondary | ICD-10-CM | POA: Diagnosis present

## 2021-04-12 DIAGNOSIS — I252 Old myocardial infarction: Secondary | ICD-10-CM | POA: Diagnosis not present

## 2021-04-12 DIAGNOSIS — K5900 Constipation, unspecified: Secondary | ICD-10-CM | POA: Diagnosis not present

## 2021-04-12 DIAGNOSIS — Z79891 Long term (current) use of opiate analgesic: Secondary | ICD-10-CM

## 2021-04-12 DIAGNOSIS — F209 Schizophrenia, unspecified: Secondary | ICD-10-CM | POA: Diagnosis present

## 2021-04-12 DIAGNOSIS — J449 Chronic obstructive pulmonary disease, unspecified: Secondary | ICD-10-CM | POA: Diagnosis not present

## 2021-04-12 DIAGNOSIS — R0603 Acute respiratory distress: Secondary | ICD-10-CM | POA: Diagnosis not present

## 2021-04-12 DIAGNOSIS — I700 Atherosclerosis of aorta: Secondary | ICD-10-CM | POA: Diagnosis not present

## 2021-04-12 DIAGNOSIS — Z87891 Personal history of nicotine dependence: Secondary | ICD-10-CM

## 2021-04-12 LAB — CBC WITH DIFFERENTIAL/PLATELET
Abs Immature Granulocytes: 0.17 10*3/uL — ABNORMAL HIGH (ref 0.00–0.07)
Basophils Absolute: 0 10*3/uL (ref 0.0–0.1)
Basophils Relative: 0 %
Eosinophils Absolute: 0.1 10*3/uL (ref 0.0–0.5)
Eosinophils Relative: 1 %
HCT: 39.2 % (ref 39.0–52.0)
Hemoglobin: 12.5 g/dL — ABNORMAL LOW (ref 13.0–17.0)
Immature Granulocytes: 2 %
Lymphocytes Relative: 12 %
Lymphs Abs: 1.4 10*3/uL (ref 0.7–4.0)
MCH: 28.5 pg (ref 26.0–34.0)
MCHC: 31.9 g/dL (ref 30.0–36.0)
MCV: 89.3 fL (ref 80.0–100.0)
Monocytes Absolute: 1 10*3/uL (ref 0.1–1.0)
Monocytes Relative: 9 %
Neutro Abs: 8.5 10*3/uL — ABNORMAL HIGH (ref 1.7–7.7)
Neutrophils Relative %: 76 %
Platelets: 273 10*3/uL (ref 150–400)
RBC: 4.39 MIL/uL (ref 4.22–5.81)
RDW: 13.4 % (ref 11.5–15.5)
WBC: 11.2 10*3/uL — ABNORMAL HIGH (ref 4.0–10.5)
nRBC: 0 % (ref 0.0–0.2)

## 2021-04-12 LAB — COMPREHENSIVE METABOLIC PANEL
ALT: 25 U/L (ref 0–44)
AST: 17 U/L (ref 15–41)
Albumin: 3 g/dL — ABNORMAL LOW (ref 3.5–5.0)
Alkaline Phosphatase: 55 U/L (ref 38–126)
Anion gap: 8 (ref 5–15)
BUN: 16 mg/dL (ref 8–23)
CO2: 31 mmol/L (ref 22–32)
Calcium: 8.5 mg/dL — ABNORMAL LOW (ref 8.9–10.3)
Chloride: 93 mmol/L — ABNORMAL LOW (ref 98–111)
Creatinine, Ser: 0.98 mg/dL (ref 0.61–1.24)
GFR, Estimated: >60 mL/min (ref >60)
Glucose, Bld: 116 mg/dL — ABNORMAL HIGH (ref 70–99)
Potassium: 3.2 mmol/L — ABNORMAL LOW (ref 3.5–5.1)
Sodium: 132 mmol/L — ABNORMAL LOW (ref 135–145)
Total Bilirubin: 0.8 mg/dL (ref 0.3–1.2)
Total Protein: 5.5 g/dL — ABNORMAL LOW (ref 6.5–8.1)

## 2021-04-12 LAB — TROPONIN I (HIGH SENSITIVITY)
Troponin I (High Sensitivity): 11 ng/L (ref <18)
Troponin I (High Sensitivity): 11 ng/L (ref <18)

## 2021-04-12 LAB — RESP PANEL BY RT-PCR (FLU A&B, COVID) ARPGX2
Influenza A by PCR: NEGATIVE
Influenza B by PCR: NEGATIVE
SARS Coronavirus 2 by RT PCR: NEGATIVE

## 2021-04-12 LAB — MAGNESIUM: Magnesium: 2 mg/dL (ref 1.7–2.4)

## 2021-04-12 MED ORDER — SODIUM CHLORIDE 0.9 % IV BOLUS
500.0000 mL | Freq: Once | INTRAVENOUS | Status: AC
  Administered 2021-04-12: 500 mL via INTRAVENOUS

## 2021-04-12 MED ORDER — TRAZODONE HCL 50 MG PO TABS: 25.0000 mg | ORAL_TABLET | Freq: Every evening | ORAL | Status: DC | PRN

## 2021-04-12 MED ORDER — ATORVASTATIN CALCIUM 20 MG PO TABS
20.0000 mg | ORAL_TABLET | Freq: Every day | ORAL | Status: DC
  Administered 2021-04-13 – 2021-04-16 (×5): 20 mg via ORAL
  Filled 2021-04-12 (×5): qty 1

## 2021-04-12 MED ORDER — GUAIFENESIN ER 600 MG PO TB12
600.0000 mg | ORAL_TABLET | Freq: Two times a day (BID) | ORAL | Status: DC
  Administered 2021-04-13 – 2021-04-17 (×10): 600 mg via ORAL
  Filled 2021-04-12 (×10): qty 1

## 2021-04-12 MED ORDER — DILTIAZEM HCL 25 MG/5ML IV SOLN: 5.0000 mg | INTRAVENOUS | Status: DC | PRN

## 2021-04-12 MED ORDER — CLOZAPINE 25 MG PO TABS
150.0000 mg | ORAL_TABLET | Freq: Every day | ORAL | Status: DC
  Administered 2021-04-13 – 2021-04-16 (×4): 150 mg via ORAL
  Filled 2021-04-12 (×5): qty 2

## 2021-04-12 MED ORDER — ENOXAPARIN SODIUM 40 MG/0.4ML IJ SOSY
40.0000 mg | PREFILLED_SYRINGE | INTRAMUSCULAR | Status: DC
  Administered 2021-04-13 – 2021-04-16 (×4): 40 mg via SUBCUTANEOUS
  Filled 2021-04-12 (×4): qty 0.4

## 2021-04-12 MED ORDER — NITROGLYCERIN 0.4 MG SL SUBL: 0.4000 mg | SUBLINGUAL_TABLET | SUBLINGUAL | Status: DC | PRN

## 2021-04-12 MED ORDER — ACETAMINOPHEN 650 MG RE SUPP: 650.0000 mg | Freq: Four times a day (QID) | RECTAL | Status: AC | PRN

## 2021-04-12 MED ORDER — ACETAMINOPHEN 325 MG PO TABS
650.0000 mg | ORAL_TABLET | Freq: Four times a day (QID) | ORAL | Status: AC | PRN
  Administered 2021-04-14 – 2021-04-15 (×2): 650 mg via ORAL
  Filled 2021-04-12 (×2): qty 2

## 2021-04-12 MED ORDER — POTASSIUM CITRATE-CITRIC ACID 1100-334 MG/5ML PO SOLN: 40.0000 meq | Freq: Once | ORAL | Status: DC

## 2021-04-12 MED ORDER — HYDROXYZINE PAMOATE 25 MG PO CAPS
25.0000 mg | ORAL_CAPSULE | Freq: Three times a day (TID) | ORAL | Status: DC | PRN
  Filled 2021-04-12: qty 1

## 2021-04-12 MED ORDER — ALBUTEROL SULFATE (2.5 MG/3ML) 0.083% IN NEBU
2.5000 mg | INHALATION_SOLUTION | Freq: Four times a day (QID) | RESPIRATORY_TRACT | Status: DC | PRN
  Administered 2021-04-14 – 2021-04-17 (×5): 2.5 mg via RESPIRATORY_TRACT
  Filled 2021-04-12 (×6): qty 3

## 2021-04-12 MED ORDER — PANTOPRAZOLE SODIUM 40 MG PO TBEC
40.0000 mg | DELAYED_RELEASE_TABLET | Freq: Two times a day (BID) | ORAL | Status: DC
  Administered 2021-04-13 – 2021-04-17 (×10): 40 mg via ORAL
  Filled 2021-04-12 (×10): qty 1

## 2021-04-12 MED ORDER — EZETIMIBE 10 MG PO TABS
10.0000 mg | ORAL_TABLET | Freq: Every day | ORAL | Status: DC
  Administered 2021-04-13 – 2021-04-16 (×4): 10 mg via ORAL
  Filled 2021-04-12 (×5): qty 1

## 2021-04-12 MED ORDER — IPRATROPIUM-ALBUTEROL 0.5-2.5 (3) MG/3ML IN SOLN
3.0000 mL | Freq: Four times a day (QID) | RESPIRATORY_TRACT | Status: DC | PRN
Start: 2021-04-12 — End: 2021-04-17
  Administered 2021-04-16: 3 mL via RESPIRATORY_TRACT
  Filled 2021-04-12: qty 3

## 2021-04-12 MED ORDER — ONDANSETRON HCL 4 MG/2ML IJ SOLN
4.0000 mg | Freq: Four times a day (QID) | INTRAMUSCULAR | Status: DC | PRN
  Administered 2021-04-14: 4 mg via INTRAVENOUS
  Filled 2021-04-12: qty 2

## 2021-04-12 MED ORDER — ONDANSETRON HCL 4 MG PO TABS
4.0000 mg | ORAL_TABLET | Freq: Four times a day (QID) | ORAL | Status: DC | PRN
Start: 2021-04-12 — End: 2021-04-17

## 2021-04-12 MED ORDER — MOMETASONE FURO-FORMOTEROL FUM 100-5 MCG/ACT IN AERO
2.0000 | INHALATION_SPRAY | Freq: Two times a day (BID) | RESPIRATORY_TRACT | Status: DC
  Administered 2021-04-13 – 2021-04-17 (×9): 2 via RESPIRATORY_TRACT
  Filled 2021-04-12: qty 8.8

## 2021-04-12 MED ORDER — DILTIAZEM HCL 25 MG/5ML IV SOLN
5.0000 mg | INTRAVENOUS | Status: DC | PRN
Start: 2021-04-12 — End: 2021-04-17
  Filled 2021-04-12: qty 5

## 2021-04-12 NOTE — H&P (Addendum)
History and Physical   Brandon [name] QQI:[id number] DOB: Sep 05, 1955 DOA: 04/12/2021  PCP: Brandon Marble, MD  Patient coming from: [facility]  I have personally briefly reviewed patient's old medical records in [place].  Chief Concern: Shortness of breath  HPI: Brandon [name] is a 65 y.o. male with medical history significant for hypertension, CAD, COPD, hyperlipidemia, history of schizoaffective disorder bipolar 1, generalized weakness, debility, query early dementia, presents emergency department for chief concerns of shortness of breath and unresponsiveness.  Per [facility], patient was unresponsive and facility called EMS.  However, chest compression and or resuscitative measures was not done prior to EMS arrival.  On arrival EMS gave patient duo nebs and albuterol with appropriate responsiveness.  It was reported that he was hypoxic, with sats in the 70s and patient was placed on nonrebreather and had agonal breathing.  At bedside patient was able to tell me his name, his age correctly.  He was not able to tell me his location.  He was snoring in between answers.  When I asked him if he was having any shortness of breath he states yes and then he states "I do not know".  He goes back to snoring and I was not able to ask if he had any chest pain or abdominal pain.  He is maintaining airway and does not appear to be in any acute distress.  Social history: Patient is from [facility]  Vaccination history: Unknown  ROS: Unable to complete due to extensive sleepiness  ED Course: Discussed with emergency medicine provider, patient requiring hospitalization for chief concerns of acute hypoxic respiratory failure.  Vitals in the emergency department was remarkable for temperature 98.6, respiration of 22, heart rate 86, blood pressure 1/58, SPO2 100% on 4 L nasal cannula.  Labs in the emergency department was remarkable for sodium 132, potassium 3.2, chloride 93,  bicarb 31, BUN of 16, serum creatinine of 0.98, nonfasting blood glucose 116, WBC 11.2, hemoglobin 12.5, platelets 273.  eGFR greater than 60.  COVID was negative.  In the emergency department patient was given sodium chloride 500 mL bolus.  Assessment/Plan  Principal Problem:   Shortness of breath Active Problems:   CAD (coronary artery disease)   Essential hypertension   Hyperlipidemia   COPD (chronic obstructive pulmonary disease) (HCC)   Schizoaffective disorder, bipolar type (HCC)   Weakness   Schizophrenia (HCC)   BPH (benign prostatic hyperplasia)   # Shortness of breath-query polypharmacy -Chest x-ray was read as clearing and improving left lung infiltrate -Check procalcitonin, TSH -Initial troponin high-sensitivity was 11, with reassuring EKG -Low clinical suspicion for ACS -CBC in the a.m. -I am holding trazodone nightly at this time due to reported unresponsiveness  # Hyperlipidemia-atorvastatin 20 mg nightly, ezetimibe 10 mg nightly  # GERD-PPI  # Hypokalemia-citric acid, 40 mill equivalent p.o.  # COPD-resumed home inhalers, and albuterol 2 puffs inhalation every 6 hours.  For shortness of breath and wheezing - Added duo nebs, every 6 hours as needed for shortness of breath and wheezing - Does not appear to be decompensating at this time  # History of hypertension-holding home amlodipine 5 mg daily at this time, carvedilol 25 mg twice daily, furosemide 20 mg daily, isosorbide mononitrate 30 mg twice daily and tamsulosin due to initial hypotensive - Diltiazem 5 mg IV every 2 hours as needed for sbp > 175, 3 doses ordered  # Depression/anxiety/psychiatric imbalance-bupropion 100 mg daily, clozapine 150 mg nightly, hydroxyzine 25 mg  3 times daily as needed for anxiety  # Constipation-Colace and senna  # Pending med reconciliation-a.m. team to complete med reconciliation  Chart reviewed.   04/03/2021 to 04/11/2021: Was admitted for hypoxia secondary to mucous  plugging given causing pneumonia.  Brandon [name] required intubation and he was subsequently extubated on 04/06/2021.  Patient completed 5 days of antibiotics.  DVT prophylaxis: Enoxaparin injection 40 mg subcutaneous every 24 hours Code Status: Full code Diet: Heart healthy Family Communication: None at this time Disposition Plan: Pending clinical course Consults called: No consults Admission status: Observation, MedSurg, telemetry for 24 hours ordered  Past Medical History:  Diagnosis Date   Anemia    IDA   Colon polyps    COPD (chronic obstructive pulmonary disease) ([place])    Coronary artery disease    Heart attack ([place]) 2005   Hiatal hernia    History of ETOH abuse    Hyperlipidemia    Hypertension    Pleurisy    Schizophrenia ([place])    Schizophrenia ([place])    Seizures ([place])    grand mal in 2000 or 2001 recovery from alcoholism   Suicide attempt [facility])    Past Surgical History:  Procedure Laterality Date   ABDOMINAL SURGERY     internal bleeding   CHOLECYSTECTOMY N/A 05/04/2018   Procedure: LAPAROSCOPIC CHOLECYSTECTOMY, converted to open;  Surgeon: Benjamine Sprague, DO;  Location: [facility] ORS;  Service: General;  Laterality: N/A;   COLONOSCOPY     COLONOSCOPY WITH PROPOFOL N/A 05/09/2016   Procedure: COLONOSCOPY WITH PROPOFOL;  Surgeon: Manya Silvas, MD;  Location: [facility] ENDOSCOPY;  Service: Endoscopy;  Laterality: N/A;   CORONARY ANGIOPLASTY WITH STENT PLACEMENT     1 vessel   ESOPHAGOGASTRODUODENOSCOPY (EGD) WITH PROPOFOL N/A 05/09/2016   Procedure: ESOPHAGOGASTRODUODENOSCOPY (EGD) WITH PROPOFOL;  Surgeon: Manya Silvas, MD;  Location: [facility] ENDOSCOPY;  Service: Endoscopy;  Laterality: N/A;   EYE SURGERY Right    GLAUCOMA SURGERY     LAPAROSCOPIC APPENDECTOMY N/A 07/17/2018   Procedure: APPENDECTOMY LAPAROSCOPIC;  Surgeon: Benjamine Sprague, DO;  Location: [facility] ORS;  Service: General;  Laterality: N/A;   LEFT HEART CATH Right 10/19/2017   Procedure: Left Heart Cath and Coronary  Angiography;  Surgeon: Dionisio David, MD;  Location: [facility] CV LAB;  Service: Cardiovascular;  Laterality: Right;   NOSE SURGERY     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   VASECTOMY     Social History:  reports that he quit smoking about 10 years ago. His smoking use included cigarettes. He smoked an average of 1 pack per day. He has quit using smokeless tobacco. He reports that he does not drink alcohol and does not use drugs.  Allergies  Allergen Reactions   Compazine [Prochlorperazine] Other (See Comments)    Dystonic rxn - convulsions. Near fatal reaction.    Ivp Dye [Iodinated Diagnostic Agents] Shortness Of Breath    Pt states SOB after last IV contrast injection   Alcohol-Sulfur [Elemental Sulfur] Other (See Comments)    History of alcoholism   Benadryl [Diphenhydramine] Other (See Comments)    "stuffy", nasal congestion   Depakote [Divalproex Sodium] Other (See Comments)    Cause elevated ammonia   Dramamine [Dimenhydrinate] Swelling   Plavix [Clopidogrel] Other (See Comments)    Rectal bleeding. "Perforated my intestines."   Rosuvastatin     Other reaction(s): Other (See Comments)   Family History  Problem Relation Age of Onset   Lung cancer Mother  Hypertension Father    Heart attack Father    CAD Father    Prostate cancer Neg Hx    Bladder Cancer Neg Hx    Kidney cancer Neg Hx    Family history: Family history reviewed and not pertinent  Prior to Admission medications   Medication Sig Start Date End Date Taking? Authorizing Provider  acetaminophen (TYLENOL) 325 MG tablet Take 650 mg by mouth every 4 (four) hours as needed for mild pain or moderate pain.    [provider]  albuterol (VENTOLIN HFA) 108 (90 Base) MCG/ACT inhaler Inhale 2 puffs into the lungs every 6 (six) hours as needed for wheezing or shortness of breath. 04/11/21   Loletha Grayer, MD  amLODipine (NORVASC) 5 MG tablet Take 1 tablet (5 mg total) by mouth daily. 03/26/21   Fritzi Mandes,  MD  atorvastatin (LIPITOR) 20 MG tablet Take 1 tablet (20 mg total) by mouth at bedtime. 04/11/21   Loletha Grayer, MD  budesonide-formoterol (SYMBICORT) 80-4.5 MCG/ACT inhaler Inhale 2 puffs into the lungs 2 (two) times daily.    [provider]  buPROPion (WELLBUTRIN SR) 100 MG 12 hr tablet Take 100 mg by mouth daily.     [provider]  carvedilol (COREG) 25 MG tablet Take 25 mg by mouth 2 (two) times daily with a meal.    [provider]  cholecalciferol (VITAMIN D) 25 MCG tablet Take 1 tablet (1,000 Units total) by mouth daily. 03/26/21   Fritzi Mandes, MD  cloZAPine (CLOZARIL) 100 MG tablet Take 150 mg by mouth at bedtime.    [provider]  docusate sodium (COLACE) 100 MG capsule Take 1 capsule (100 mg total) by mouth 2 (two) times daily. Hold if has diarrhea 04/11/21   Loletha Grayer, MD  dorzolamide-timolol (COSOPT) 22.3-6.8 MG/ML ophthalmic solution Place 1 drop into both eyes 2 (two) times daily.    [provider]  ezetimibe (ZETIA) 10 MG tablet Take 10 mg by mouth at bedtime.     [provider]  furosemide (LASIX) 20 MG tablet Take 0.5 tablets (10 mg total) by mouth daily. 04/12/21   Loletha Grayer, MD  guaiFENesin (MUCINEX) 600 MG 12 hr tablet Take 1 tablet (600 mg total) by mouth 2 (two) times daily for 14 days. 04/11/21 04/25/21  Loletha Grayer, MD  hydrOXYzine (VISTARIL) 25 MG capsule Take 1 capsule (25 mg total) by mouth 3 (three) times daily as needed for anxiety. 04/11/21   Loletha Grayer, MD  isosorbide mononitrate (IMDUR) 30 MG 24 hr tablet Take 30 mg by mouth 2 (two) times daily.     [provider]  latanoprost (XALATAN) 0.005 % ophthalmic solution Place 1 drop into both eyes at bedtime. 09/15/17   [provider]  nitroGLYCERIN (NITROSTAT) 0.4 MG SL tablet Place 0.4 mg under the tongue every 5 (five) minutes as needed for chest pain.     [provider]  olmesartan (BENICAR) 20 MG tablet Take 20 mg  by mouth daily.    [provider]  pantoprazole (PROTONIX) 40 MG tablet Take 40 mg by mouth 2 (two) times daily.    [provider]  senna (SENOKOT) 8.6 MG tablet Take 1-2 tablets by mouth daily as needed for constipation.    [provider]  sodium chloride 1 g tablet Take 1 tablet (1 g total) by mouth 3 (three) times daily with meals. 03/25/21   Fritzi Mandes, MD  tamsulosin (FLOMAX) 0.4 MG CAPS capsule Take 0.4 mg by  mouth daily.     [provider]  traZODone (DESYREL) 50 MG tablet Take 0.5 tablets (25 mg total) by mouth at bedtime as needed for sleep. 04/11/21   Loletha Grayer, MD   Physical Exam: Vitals:   04/12/21 2100 04/12/21 2119 04/12/21 2130 04/12/21 2145  BP: (!) 100/58  100/61   Pulse: 86  80 79  Resp: (!) 22  20 (!) 23  Temp:      TempSrc:      SpO2: 100% (S) 98% 92% 94%  Weight:      Height:       Constitutional: appears age-appropriate, frail, NAD, calm, comfortable Eyes: PERRL, lids and conjunctivae normal ENMT: Mucous membranes are moist. Posterior pharynx clear of any exudate or lesions. Age-appropriate dentition. Hearing appropriate Neck: normal, supple, no masses, no thyromegaly Respiratory: clear to auscultation bilaterally, no wheezing, no crackles. Normal respiratory effort. No accessory muscle use.  Cardiovascular: Regular rate and rhythm, no murmurs / rubs / gallops. No extremity edema. 2+ pedal pulses. No carotid bruits.  Abdomen: no tenderness, no masses palpated, no hepatosplenomegaly. Bowel sounds positive.  Musculoskeletal: no clubbing / cyanosis. No joint deformity upper and lower extremities. Good ROM, no contractures, no atrophy. Normal muscle tone.  Skin: no rashes, lesions, ulcers. No induration, pale skin Neurologic: Sensation intact. Strength 5/5 in all 4.  Psychiatric: Normal judgment and insight. Alert and oriented x 3. Normal mood.   EKG: independently reviewed, showing sinus rhythm with rate of 77, QTc 452,  generalized LVH  Chest x-ray on Admission: I personally reviewed and I agree with radiologist reading as below.  DG Chest Portable 1 View  Result Date: 04/12/2021 CLINICAL DATA:  Shortness of breath. Diaphoresis and agonal respirations. EXAM: PORTABLE CHEST 1 VIEW COMPARISON:  Radiographs 04/07/2021 and 04/06/2021.  CT 04/05/2021. FINDINGS: 2041 hours. The heart size and mediastinal contours are stable. The previously demonstrated left basilar airspace opacities have largely cleared. There are residual patchy bibasilar pulmonary opacities. No pneumothorax or significant pleural effusion. Multiple telemetry leads overlie the chest. IMPRESSION: Interval partial clearing of left lung infiltrates consistent with improving pneumonia. No new findings. Electronically Signed   By: Richardean Sale M.D.   On: 04/12/2021 20:55    Labs on Admission: I have personally reviewed following labs  CBC: Recent Labs  Lab 04/07/21 0202 04/08/21 0620 04/09/21 0515 04/10/21 0401 04/12/21 2018  WBC 12.7* 9.9 9.9 8.0 11.2*  NEUTROABS 11.7* 9.0* 9.1* 7.2 8.5*  HGB 12.5* 12.0* 11.2* 10.9* 12.5*  HCT 36.1* 35.2* 34.2* 33.8* 39.2  MCV 84.9 86.7 87.5 86.9 89.3  PLT 241 252 265 275 381   Basic Metabolic Panel: Recent Labs  Lab 04/06/21 0818 04/06/21 1152 04/07/21 0202 04/07/21 7711 04/08/21 0620 04/09/21 0515 04/10/21 0401 04/11/21 0455 04/12/21 2018  NA 121*   < > 125*   < > 127* 132* 132* 134* 132*  K  --   --  3.8  --  3.6 3.6 3.9 3.9 3.2*  CL  --   --  84*  --  87* 92* 94* 95* 93*  CO2  --   --  31  --  34* 33* 34* 34* 31  GLUCOSE  --   --  125*  --  127* 117* 112* 110* 116*  BUN  --   --  13  --  '12 14 11 12 16  ' CREATININE  --   --  0.92  --  0.65 0.60* 0.44* 0.55* 0.98  CALCIUM  --   --  8.5*  --  8.6* 8.7* 8.6* 8.7* 8.5*  MG 1.7  --  1.7  --   --   --   --   --   --   PHOS  --   --  5.6*  --  2.7 3.6 4.5  --   --    < > = values in this interval not displayed.   GFR: Estimated Creatinine  Clearance: 82.5 mL/min (by C-G formula based on SCr of 0.98 mg/dL).  Liver Function Tests: Recent Labs  Lab 04/12/21 2018  AST 17  ALT 25  ALKPHOS 55  BILITOT 0.8  PROT 5.5*  ALBUMIN 3.0*   CBG: Recent Labs  Lab 04/06/21 0638 04/06/21 0939 04/06/21 2015  GLUCAP 112* 105* 128*   Urine analysis:    Component Value Date/Time   COLORURINE YELLOW (A) 04/05/2021 1250   APPEARANCEUR CLEAR (A) 04/05/2021 1250   LABSPEC 1.010 04/05/2021 1250   PHURINE 7.0 04/05/2021 1250   GLUCOSEU NEGATIVE 04/05/2021 1250   HGBUR NEGATIVE 04/05/2021 1250   BILIRUBINUR NEGATIVE 04/05/2021 1250   KETONESUR 5 (A) 04/05/2021 1250   PROTEINUR NEGATIVE 04/05/2021 1250   NITRITE NEGATIVE 04/05/2021 1250   LEUKOCYTESUR NEGATIVE 04/05/2021 1250   Dr. Tobie Poet [facility]  If 7PM-7AM, please contact overnight-coverage provider If 7AM-7PM, please contact day coverage provider [email or web address]  04/12/2021, 11:18 PM

## 2021-04-12 NOTE — ED Provider Notes (Signed)
[facility] Emergency Department Provider Note  Time seen: 8:11 PM  I have reviewed the triage vital signs and the nursing notes.   HISTORY  Chief Complaint Altered Mental Status and Respiratory Distress   HPI JERROLD HASKELL is a 65 y.o. male with a past medical history of anemia, COPD, CAD, hypertension, hyperlipidemia, schizophrenia, presents to the emergency department for unresponsiveness.  According to EMS they were called out to the patient's rehab facility for cardiac arrest.  When they arrived they found the patient unresponsive but no CPR in progress.  Patient had pulses and was breathing.  They state they found the patient to be extremely hypotensive with bilateral rhonchi and they transported him to the emergency department.  They state initial sats were in the 70s however they did note that the patient felt very cold with an axillary temperature of 93.5.  Initial BP per EMS 83/57.  EMS states patient essentially unresponsive throughout transport to the emergency department.  Arrives to the emergency traffic.  Here the patient is somewhat somnolent and fatigued appearing but is able to answer questions denies any chest pain, but does state mild shortness of breath.   Past Medical History:  Diagnosis Date   Anemia    IDA   Colon polyps    COPD (chronic obstructive pulmonary disease) (HCC)    Coronary artery disease    Heart attack (HCC) 2005   Hiatal hernia    History of ETOH abuse    Hyperlipidemia    Hypertension    Pleurisy    Schizophrenia (HCC)    Schizophrenia (HCC)    Seizures (HCC)    grand mal in 2000 or 2001 recovery from alcoholism   Suicide attempt [facility])     Patient Active Problem List   Diagnosis Date Noted   Acute respiratory failure with hypoxia (HCC)    Aspiration pneumonia of both lungs due to gastric secretions (HCC)    Lobar pneumonia, unspecified organism (HCC)    Vitamin D deficiency    Epigastric abdominal pain     Recurrent falls    Hyponatremia 03/22/2021   BPH (benign prostatic hyperplasia) 03/22/2021   Weakness    Schizophrenia (HCC)    Acute hyponatremia 11/27/2020   Appendicitis with perforation 07/17/2018   Acute cholecystitis 04/10/2018   Chronic cholecystitis 04/08/2018   Atherosclerosis of native arteries of extremity with intermittent claudication (HCC) 11/22/2017   Essential hypertension 11/22/2017   Hyperlipidemia 11/22/2017   COPD (chronic obstructive pulmonary disease) (HCC) 11/22/2017   CAD (coronary artery disease) 10/15/2017   Other toe(s) amputation status 11/17/2014   Schizoaffective disorder, bipolar type (HCC) 08/23/2008    Past Surgical History:  Procedure Laterality Date   ABDOMINAL SURGERY     internal bleeding   CHOLECYSTECTOMY N/A 05/04/2018   Procedure: LAPAROSCOPIC CHOLECYSTECTOMY, converted to open;  Surgeon: Sung Amabile, DO;  Location: [facility] ORS;  Service: General;  Laterality: N/A;   COLONOSCOPY     COLONOSCOPY WITH PROPOFOL N/A 05/09/2016   Procedure: COLONOSCOPY WITH PROPOFOL;  Surgeon: Scot Jun, MD;  Location: [facility] ENDOSCOPY;  Service: Endoscopy;  Laterality: N/A;   CORONARY ANGIOPLASTY WITH STENT PLACEMENT     1 vessel   ESOPHAGOGASTRODUODENOSCOPY (EGD) WITH PROPOFOL N/A 05/09/2016   Procedure: ESOPHAGOGASTRODUODENOSCOPY (EGD) WITH PROPOFOL;  Surgeon: Scot Jun, MD;  Location: [facility] ENDOSCOPY;  Service: Endoscopy;  Laterality: N/A;   EYE SURGERY Right    GLAUCOMA SURGERY     LAPAROSCOPIC APPENDECTOMY N/A 07/17/2018   Procedure:  APPENDECTOMY LAPAROSCOPIC;  Surgeon: Sung AmabileSakai, Isami, DO;  Location: [facility] ORS;  Service: General;  Laterality: N/A;   LEFT HEART CATH Right 10/19/2017   Procedure: Left Heart Cath and Coronary Angiography;  Surgeon: Laurier NancyKhan, Shaukat A, MD;  [facility] CV LAB;  Service: Cardiovascular;  Laterality: Right;   NOSE SURGERY     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   VASECTOMY      Prior to Admission medications    Medication Sig Start Date End Date Taking? Authorizing Provider  acetaminophen (TYLENOL) 325 MG tablet Take 650 mg by mouth every 4 (four) hours as needed for mild pain or moderate pain.    [provider]  albuterol (VENTOLIN HFA) 108 (90 Base) MCG/ACT inhaler Inhale 2 puffs into the lungs every 6 (six) hours as needed for wheezing or shortness of breath. 04/11/21   Alford HighlandWieting, Richard, MD  amLODipine (NORVASC) 5 MG tablet Take 1 tablet (5 mg total) by mouth daily. 03/26/21   Enedina FinnerPatel, Sona, MD  atorvastatin (LIPITOR) 20 MG tablet Take 1 tablet (20 mg total) by mouth at bedtime. 04/11/21   Alford HighlandWieting, Richard, MD  budesonide-formoterol (SYMBICORT) 80-4.5 MCG/ACT inhaler Inhale 2 puffs into the lungs 2 (two) times daily.    [provider]  buPROPion (WELLBUTRIN SR) 100 MG 12 hr tablet Take 100 mg by mouth daily.     [provider]  carvedilol (COREG) 25 MG tablet Take 25 mg by mouth 2 (two) times daily with a meal.    [provider]  cholecalciferol (VITAMIN D) 25 MCG tablet Take 1 tablet (1,000 Units total) by mouth daily. 03/26/21   Enedina FinnerPatel, Sona, MD  cloZAPine (CLOZARIL) 100 MG tablet Take 150 mg by mouth at bedtime.    [provider]  docusate sodium (COLACE) 100 MG capsule Take 1 capsule (100 mg total) by mouth 2 (two) times daily. Hold if has diarrhea 04/11/21   Alford HighlandWieting, Richard, MD  dorzolamide-timolol (COSOPT) 22.3-6.8 MG/ML ophthalmic solution Place 1 drop into both eyes 2 (two) times daily.    [provider]  ezetimibe (ZETIA) 10 MG tablet Take 10 mg by mouth at bedtime.     [provider]  furosemide (LASIX) 20 MG tablet Take 0.5 tablets (10 mg total) by mouth daily. 04/12/21   Alford HighlandWieting, Richard, MD  guaiFENesin (MUCINEX) 600 MG 12 hr tablet Take 1 tablet (600 mg total) by mouth 2 (two) times daily for 14 days. 04/11/21 04/25/21  Alford HighlandWieting, Richard, MD  hydrOXYzine (VISTARIL) 25 MG capsule Take 1 capsule (25 mg total) by mouth 3 (three) times  daily as needed for anxiety. 04/11/21   Alford HighlandWieting, Richard, MD  isosorbide mononitrate (IMDUR) 30 MG 24 hr tablet Take 30 mg by mouth 2 (two) times daily.     [provider]  latanoprost (XALATAN) 0.005 % ophthalmic solution Place 1 drop into both eyes at bedtime. 09/15/17   [provider]  nitroGLYCERIN (NITROSTAT) 0.4 MG SL tablet Place 0.4 mg under the tongue every 5 (five) minutes as needed for chest pain.     [provider]  olmesartan (BENICAR) 20 MG tablet Take 20 mg by mouth daily.    [provider]  pantoprazole (PROTONIX) 40 MG tablet Take 40 mg by mouth 2 (two) times daily.    [provider]  senna (SENOKOT) 8.6 MG tablet Take 1-2 tablets by mouth daily as needed for constipation.    [provider]  sodium chloride 1 g tablet Take 1  tablet (1 g total) by mouth 3 (three) times daily with meals. 03/25/21   Enedina Finner, MD  tamsulosin (FLOMAX) 0.4 MG CAPS capsule Take 0.4 mg by mouth daily.     [provider]  traZODone (DESYREL) 50 MG tablet Take 0.5 tablets (25 mg total) by mouth at bedtime as needed for sleep. 04/11/21   Alford Highland, MD    Allergies  Allergen Reactions   Compazine [Prochlorperazine] Other (See Comments)    Dystonic rxn - convulsions. Near fatal reaction.    Ivp Dye [Iodinated Diagnostic Agents] Shortness Of Breath    Pt states SOB after last IV contrast injection   Alcohol-Sulfur [Elemental Sulfur] Other (See Comments)    History of alcoholism   Benadryl [Diphenhydramine] Other (See Comments)    "stuffy", nasal congestion   Depakote [Divalproex Sodium] Other (See Comments)    Cause elevated ammonia   Dramamine [Dimenhydrinate] Swelling   Plavix [Clopidogrel] Other (See Comments)    Rectal bleeding. "Perforated my intestines."   Rosuvastatin     Other reaction(s): Other (See Comments)    Family History  Problem Relation Age of Onset   Lung cancer Mother    Hypertension Father    Heart  attack Father    CAD Father    Prostate cancer Neg Hx    Bladder Cancer Neg Hx    Kidney cancer Neg Hx     Social History Social History   Tobacco Use   Smoking status: Former    Packs/day: 1.00    Types: Cigarettes    Quit date: 2012    Years since quitting: 10.6   Smokeless tobacco: Former  Building services engineer Use: Never used  Substance Use Topics   Alcohol use: No    Comment: no alcohol since 2010   Drug use: No    Review of Systems Constitutional: Negative for fever. Cardiovascular: Negative for chest pain. Respiratory: Positive for shortness of breath. Gastrointestinal: Negative for abdominal pain Musculoskeletal: Negative for musculoskeletal complaints Neurological: Negative for headache All other ROS negative  ____________________________________________   PHYSICAL EXAM:  Constitutional: Patient is fatigued appearing but is awake alert he is able to answer questions and follows commands well.  Initial blood pressures 90s over 50s. Eyes: Normal exam ENT      Head: Normocephalic and atraumatic.      Mouth/Throat: Mucous membranes are moist. Cardiovascular: Normal rate, regular rhythm.  Respiratory: Normal respiratory effort without tachypnea nor retractions. Breath sounds are clear no significant wheeze rales or rhonchi auscultated. Gastrointestinal: Soft and nontender. No distention.  Musculoskeletal: Nontender with normal range of motion in all extremities.  Neurologic:  Normal speech and language. No gross focal neurologic deficits.  Equal grip strength bilaterally.  4+/5 strength in all extremities.  No obvious deficits identified. Skin:  Skin is warm, dry and intact.  Psychiatric: Mood and affect are normal.   ____________________________________________    EKG  EKG viewed and interpreted by myself shows a normal sinus rhythm at 77 bpm with a narrow QRS, normal axis, normal intervals, no concerning ST  changes.  ____________________________________________    RADIOLOGY  Chest x-ray shows clearing of left lung infiltrates, improving pneumonia.  ____________________________________________   INITIAL IMPRESSION / ASSESSMENT AND PLAN / ED COURSE  Pertinent labs & imaging results that were available during my care of the patient were reviewed by me and considered in my medical decision making (see chart for details).   Patient presents emergency department as emergency traffic for unresponsiveness.  Here the patient appears fatigued but is awake and able to answer questions.  Follows commands.  EMS states upon their arrival patient completely unresponsive cool to the touch and satting 75% quite hypotensive as well.  Patient has become more responsive throughout his transport and is now awake and alert although somnolent appearing he is able to follow commands and answer questions.  Patient essentially denies any symptoms besides feeling short of breath.  We will check labs, chest x-ray obtain an EKG, COVID swab.    Patient's work-up is surprisingly normal/reassuring.  Vital signs are largely normal.  We have titrated the patient down off oxygen satting 94%.  Blood pressure mildly hypotensive 100/61, afebrile.  Chest x-ray shows improvement of aeration.  Lab work is overall reassuring including a negative troponin.  COVID-negative.  However given the patient's initial presentation by EMS I do believe patient needs admission for further work-up treatment and monitoring.  Hospice will be admitting.  SHADRACH BARTUNEK was evaluated in Emergency Department on 04/12/2021 for the symptoms described in the history of present illness. He was evaluated in the context of the global COVID-19 pandemic, which necessitated consideration that the patient might be at risk for infection with the SARS-CoV-2 virus that causes COVID-19. Institutional protocols and algorithms that pertain to the evaluation of patients at  risk for COVID-19 are in a state of rapid change based on information released by regulatory bodies including the CDC and federal and state organizations. These policies and algorithms were followed during the patient's care in the ED.  ____________________________________________   FINAL CLINICAL IMPRESSION(S) / ED DIAGNOSES  Unresponsiveness    Minna Antis, MD 04/12/21 [id number]

## 2021-04-12 NOTE — ED Triage Notes (Signed)
Pt BIBA from [facility] c/o for cardiac arrest, but did not start CPR per EMS. EMS reports pt was cool, clammy, with agonal RR and had a pulse on there arrival. Pt normally walky talky per [facility] staff. Pt lethargic on arrival. EMS reports sinus rhythm on monitor. EMS gave albuteral and duoneb.   BP 83/57 HR 77 75% RA on NRB CBG 130 Temp 93.5 F

## 2021-04-12 NOTE — ED Notes (Signed)
Called lab to obtain pending orders.

## 2021-04-13 ENCOUNTER — Encounter: Payer: Self-pay | Admitting: Internal Medicine

## 2021-04-13 DIAGNOSIS — R0602 Shortness of breath: Secondary | ICD-10-CM | POA: Diagnosis not present

## 2021-04-13 LAB — URINALYSIS, COMPLETE (UACMP) WITH MICROSCOPIC
Bilirubin Urine: NEGATIVE
Glucose, UA: NEGATIVE mg/dL
Hgb urine dipstick: NEGATIVE
Ketones, ur: NEGATIVE mg/dL
Leukocytes,Ua: NEGATIVE
Nitrite: NEGATIVE
Protein, ur: NEGATIVE mg/dL
Specific Gravity, Urine: 1.015 (ref 1.005–1.030)
Squamous Epithelial / HPF: NONE SEEN (ref 0–5)
WBC, UA: NONE SEEN WBC/hpf (ref 0–5)
pH: 7.5 (ref 5.0–8.0)

## 2021-04-13 LAB — URINE DRUG SCREEN, QUALITATIVE (ARMC ONLY)
Amphetamines, Ur Screen: NOT DETECTED
Barbiturates, Ur Screen: NOT DETECTED
Benzodiazepine, Ur Scrn: POSITIVE — AB
Cannabinoid 50 Ng, Ur ~~LOC~~: NOT DETECTED
Cocaine Metabolite,Ur ~~LOC~~: NOT DETECTED
MDMA (Ecstasy)Ur Screen: NOT DETECTED
Methadone Scn, Ur: NOT DETECTED
Opiate, Ur Screen: NOT DETECTED
Phencyclidine (PCP) Ur S: NOT DETECTED
Tricyclic, Ur Screen: NOT DETECTED

## 2021-04-13 LAB — CBC
HCT: 41.2 % (ref 39.0–52.0)
Hemoglobin: 13 g/dL (ref 13.0–17.0)
MCH: 27.8 pg (ref 26.0–34.0)
MCHC: 31.6 g/dL (ref 30.0–36.0)
MCV: 88 fL (ref 80.0–100.0)
Platelets: 274 10*3/uL (ref 150–400)
RBC: 4.68 MIL/uL (ref 4.22–5.81)
RDW: 13.5 % (ref 11.5–15.5)
WBC: 9.5 10*3/uL (ref 4.0–10.5)
nRBC: 0 % (ref 0.0–0.2)

## 2021-04-13 LAB — TSH: TSH: 1.979 u[IU]/mL (ref 0.350–4.500)

## 2021-04-13 LAB — BLOOD GAS, ARTERIAL
Acid-Base Excess: 7.8 mmol/L — ABNORMAL HIGH (ref 0.0–2.0)
Bicarbonate: 33.7 mmol/L — ABNORMAL HIGH (ref 20.0–28.0)
FIO2: 0.28
O2 Saturation: 94.1 %
Patient temperature: 37
pCO2 arterial: 52 mmHg — ABNORMAL HIGH (ref 32.0–48.0)
pH, Arterial: 7.42 (ref 7.350–7.450)
pO2, Arterial: 70 mmHg — ABNORMAL LOW (ref 83.0–108.0)

## 2021-04-13 LAB — BASIC METABOLIC PANEL
Anion gap: 4 — ABNORMAL LOW (ref 5–15)
BUN: 13 mg/dL (ref 8–23)
CO2: 35 mmol/L — ABNORMAL HIGH (ref 22–32)
Calcium: 8.7 mg/dL — ABNORMAL LOW (ref 8.9–10.3)
Chloride: 97 mmol/L — ABNORMAL LOW (ref 98–111)
Creatinine, Ser: 0.71 mg/dL (ref 0.61–1.24)
GFR, Estimated: >60 mL/min (ref >60)
Glucose, Bld: 93 mg/dL (ref 70–99)
Potassium: 3.9 mmol/L (ref 3.5–5.1)
Sodium: 136 mmol/L (ref 135–145)

## 2021-04-13 LAB — VITAMIN B12: Vitamin B-12: 496 pg/mL (ref 180–914)

## 2021-04-13 LAB — GLUCOSE, CAPILLARY: Glucose-Capillary: 116 mg/dL — ABNORMAL HIGH (ref 70–99)

## 2021-04-13 LAB — PROCALCITONIN: Procalcitonin: <0.1 ng/mL

## 2021-04-13 MED ORDER — SENNA 8.6 MG PO TABS: 1.0000 | ORAL_TABLET | Freq: Every day | ORAL | Status: DC | PRN

## 2021-04-13 MED ORDER — HYDROXYZINE HCL 25 MG PO TABS
25.0000 mg | ORAL_TABLET | Freq: Three times a day (TID) | ORAL | Status: DC | PRN
  Administered 2021-04-13: 25 mg via ORAL
  Filled 2021-04-13: qty 1

## 2021-04-13 MED ORDER — BISACODYL 10 MG RE SUPP: 10.0000 mg | Freq: Every day | RECTAL | Status: DC | PRN

## 2021-04-13 MED ORDER — HYDROXYZINE HCL 25 MG PO TABS
25.0000 mg | ORAL_TABLET | Freq: Three times a day (TID) | ORAL | Status: DC
  Administered 2021-04-13 – 2021-04-17 (×9): 25 mg via ORAL
  Filled 2021-04-13 (×11): qty 1

## 2021-04-13 MED ORDER — BISACODYL 5 MG PO TBEC
5.0000 mg | DELAYED_RELEASE_TABLET | Freq: Every day | ORAL | Status: DC | PRN
  Filled 2021-04-13: qty 1

## 2021-04-13 MED ORDER — BUPROPION HCL ER (SR) 100 MG PO TB12
100.0000 mg | ORAL_TABLET | Freq: Every day | ORAL | Status: DC
  Administered 2021-04-13 – 2021-04-17 (×5): 100 mg via ORAL
  Filled 2021-04-13 (×5): qty 1

## 2021-04-13 MED ORDER — POLYETHYLENE GLYCOL 3350 17 G PO PACK
17.0000 g | PACK | Freq: Every day | ORAL | Status: DC
  Administered 2021-04-13 – 2021-04-16 (×4): 17 g via ORAL
  Filled 2021-04-13 (×4): qty 1

## 2021-04-13 MED ORDER — VITAMIN D 25 MCG (1000 UNIT) PO TABS
1000.0000 [IU] | ORAL_TABLET | Freq: Every day | ORAL | Status: DC
  Administered 2021-04-13 – 2021-04-17 (×5): 1000 [IU] via ORAL
  Filled 2021-04-13 (×5): qty 1

## 2021-04-13 MED ORDER — TAMSULOSIN HCL 0.4 MG PO CAPS
0.4000 mg | ORAL_CAPSULE | Freq: Every day | ORAL | Status: DC
  Administered 2021-04-13 – 2021-04-17 (×5): 0.4 mg via ORAL
  Filled 2021-04-13 (×5): qty 1

## 2021-04-13 MED ORDER — DOCUSATE SODIUM 100 MG PO CAPS
100.0000 mg | ORAL_CAPSULE | Freq: Two times a day (BID) | ORAL | Status: DC
  Administered 2021-04-13 – 2021-04-16 (×9): 100 mg via ORAL
  Filled 2021-04-13 (×9): qty 1

## 2021-04-13 MED ORDER — SODIUM CHLORIDE 1 G PO TABS
1.0000 g | ORAL_TABLET | Freq: Three times a day (TID) | ORAL | Status: DC
  Administered 2021-04-13 – 2021-04-17 (×10): 1 g via ORAL
  Filled 2021-04-13 (×15): qty 1

## 2021-04-13 NOTE — Progress Notes (Signed)
CLOZAPINE MONITORING (reflects NEW REMS GUIDELINES EFFECTIVE 05/22/2014):  Check ANC at least weekly while inpatient.  For general population patients, i.e., those without benign ethnic neutropenia (BEN): --If ANC 1000-1499, increase ANC monitoring to 3x/wk --If ANC < 1000, HOLD CLOZAPINE and get psych consult   For patients with BEN: --If ANC 500-999, increase ANC monitoring to 3x/wk --If ANC < 500, HOLD CLOZAPINE and get psych consult  REMS-certified psychiatry provider can continue drug with ANC below cited thresholds if they document medical opinion that the neutropenia is not clozapine-induced (heme consult is recommended) or that risk of interrupting therapy is greater than the risk of developing severe neutropenia.  --SEE PRESCRIBING INFORMATION FOR ADDITIONAL DETAILS   9/2:  ANC = 8500  Pt is enrolled in Clozaril REMS program and eligible to receive clozaril. Will recheck ANC in 1 week on 9/9.

## 2021-04-13 NOTE — Progress Notes (Signed)
  Chaplain On-Call received call from [name], who reported that the patient and his brother requested information regarding [place] and Living Will documents.  Chaplain met the patient and his brother Brandon [name]. They described the patient's interest in the Advance Directive documents, and his choice to name Brandon [name] as the [employer]. Chaplain provided supportive listening, and education about the AD documents.  Chaplain also informed them that the documents cannot be completed on weekends due to no hospital Volunteers to serve as Witnesses nor hospital employee Notaries being available.  Chaplain urged the patient to have clear communication with the medical team as treatment plans are discussed.  They stated their understanding, and appreciation for this Chaplain's spiritual support.  Chaplain Pollyann Samples M.Div., [facility]

## 2021-04-13 NOTE — Progress Notes (Signed)
SATURATION QUALIFICATIONS: (This note is used to comply with regulatory documentation for home oxygen)  Patient Saturations on Room Air at Rest = 93%  Patient Saturations on Room Air while Ambulating = 88%  Patient Saturations on 2 Liters of oxygen while Ambulating = 94%  Please briefly explain why patient needs home oxygen: Low oxygen saturation while ambulating on RA

## 2021-04-13 NOTE — Plan of Care (Signed)

## 2021-04-13 NOTE — Progress Notes (Signed)
[facility] Progress Note  Patient: Brandon [name]    TDS:[id number]  DOA: 04/12/2021     Date of Service: the patient was seen and examined on 04/13/2021  Chief Complaint  Patient presents with   Altered Mental Status   Respiratory Distress   Brief hospital course: Brandon [name] is a 65 y.o. male with medical history significant for hypertension, CAD, COPD, hyperlipidemia, history of schizoaffective disorder bipolar 1, generalized weakness, debility, query early dementia, presents emergency department for chief concerns of shortness of breath and unresponsiveness.   [facility], patient was unresponsive and facility called EMS.  However, chest compression and or resuscitative measures was not done prior to EMS arrival.  On arrival EMS gave patient duo nebs and albuterol with appropriate responsiveness.   It was reported that he was hypoxic, with sats in the 70s and patient was placed on nonrebreather and had agonal breathing.   At bedside patient was able to tell me his name, his age correctly.  He was not able to tell me his location.  He was snoring in between answers.  When I asked him if he was having any shortness of breath he states yes and then he states "I do not know".   He goes back to snoring and I was not able to ask if he had any chest pain or abdominal pain.  He is maintaining airway and does not appear to be in any acute distress.  ED Course: Discussed with emergency medicine provider, patient requiring hospitalization for chief concerns of acute hypoxic respiratory failure. Vitals in the emergency department was remarkable for temperature 98.6, respiration of 22, heart rate 86, blood pressure 1/58, SPO2 100% on 4 L nasal cannula. Labs in the emergency department was remarkable for sodium 132, potassium 3.2, chloride 93, bicarb 31, BUN of 16, serum creatinine of 0.98, nonfasting blood glucose 116, WBC 11.2, hemoglobin 12.5, platelets 273.  eGFR greater than 60.   COVID was negative.   In the emergency department patient was given sodium chloride 500 mL bolus.   Assessment and Plan: Principal Problem:   Shortness of breath Active Problems:   CAD (coronary artery disease)   Essential hypertension   Hyperlipidemia   COPD (chronic obstructive pulmonary disease) (HCC)   Schizoaffective disorder, bipolar type (HCC)   Weakness   Schizophrenia (HCC)   BPH (benign prostatic hyperplasia)    # Acute hypoxic and hypercapnic respiratory failure, patient was found unresponsive and O2 saturation was in 70s, patient responded well to [facility] treatment given by EMS and patient was placed on non-rebreather.  ABG consistent with hypoxia and hypercapnia -Chest x-ray was read as clearing and improving left lung infiltrate --pro-calcitonin negative TSH 1.9 wnl -Initial troponin high-sensitivity was 11, with reassuring EKG -Low clinical suspicion for ACS -CBC in the a.m. -I am holding trazodone nightly at this time due to reported unresponsiveness Continue Dulera inhaler and DuoNeb    # COPD-resumed home inhalers, and albuterol 2 puffs inhalation every 6 hours, for shortness of breath and wheezing - Added duo nebs, every 6 hours as needed for shortness of breath and wheezing Will hold off systemic steroids at this time  # Hyperlipidemia-atorvastatin 20 mg nightly, ezetimibe 10 mg nightly   # GERD-PPI   # Hypokalemia-K repleted     # Hypotension, History of hypertension-holding home amlodipine 5 mg daily at this time, carvedilol 25 mg twice daily, furosemide 20 mg daily, isosorbide mononitrate 30 mg twice daily,  due to initial hypotensive -  Diltiazem 5 mg IV every 2 hours as needed for sbp > 175, 3 doses ordered Blood pressure is still soft, we will continue to hold antihypertensive medications for now   # Depression/anxiety/psychiatric imbalance-bupropion 100 mg daily, clozapine 150 mg nightly, hydroxyzine 25 mg 3 times daily as needed for anxiety   #  Constipation-Colace and senna   # Anxiety, resumed Klonopin and started Atarax 25 mg p.o. 3 times daily   Body mass index is 24.07 kg/m.  Interventions:       Diet: Heart healthy DVT Prophylaxis: Subcutaneous Lovenox   Advance goals of care discussion: Full code  Family Communication: family was not present at bedside, at the time of interview.  The pt provided permission to discuss medical plan with the family. Opportunity was given to ask question and all questions were answered satisfactorily.   Disposition:  Pt is from SNF, admitted with acute hypoxic and hypercapnic respiratory failure, still has respiratory failure, required 2 L oxygen via nasal cannula, which precludes a safe discharge. Discharge to SNF, when hypoxia will resolve most likely in 1 to 2 days.  Patient will need PT and OT eval to return back to SNF.  Social worker and case manager aware  Subjective: No significant overnight events, patient was admitted with respiratory failure and unresponsiveness.  Today patient is AO x3, currently patient is requiring O2 inhalation 2 L oxygen via nasal cannula.  Patient feels improvement in the breathing, patient was having anxiety and he was requesting for Klonopin which was already resumed by admitting attending. Started Atarax as above for anxiety.  Patient denied any chest pain or palpitations, no any other active issues.  Physical Exam: General:  alert oriented to time, place, and person.  Appear in mild distress, affect appropriate Eyes: PERRLA ENT: Oral Mucosa Clear, moist  Neck: no JVD,  Cardiovascular: S1 and S2 Present, no Murmur,  Respiratory: good respiratory effort, Bilateral Air entry equal, mild bilateral crackles and mild bilateral wheezing Abdomen: Bowel Sound present, Soft and no tenderness,  Skin: no rashes Extremities: no Pedal edema, no calf tenderness Neurologic: without any new focal findings Gait not checked due to patient safety concerns  Vitals:    04/13/21 0000 04/13/21 0030 04/13/21 0100 04/13/21 0200  BP: '95/64 97/79 96/64 ' 102/69  Pulse: 78 78 76 76  Resp: (!) '25 20 20 ' (!) 25  Temp:      TempSrc:      SpO2: 92% 91% 93% (!) 13%  Weight:      Height:        Intake/Output Summary (Last 24 hours) at 04/13/2021 1436 Last data filed at 04/13/2021 0700 Gross per 24 hour  Intake --  Output 901 ml  Net -901 ml   Filed Weights   04/12/21 2012  Weight: 80.5 kg    Data Reviewed: I have personally reviewed and interpreted daily labs, tele strips, imagings as discussed above. I reviewed all nursing notes, pharmacy notes, vitals, pertinent old records I have discussed plan of care as described above with RN and patient/family.  CBC: Recent Labs  Lab 04/07/21 0202 04/08/21 0620 04/09/21 0515 04/10/21 0401 04/12/21 2018 04/13/21 0541  WBC 12.7* 9.9 9.9 8.0 11.2* 9.5  NEUTROABS 11.7* 9.0* 9.1* 7.2 8.5*  --   HGB 12.5* 12.0* 11.2* 10.9* 12.5* 13.0  HCT 36.1* 35.2* 34.2* 33.8* 39.2 41.2  MCV 84.9 86.7 87.5 86.9 89.3 88.0  PLT 241 252 265 275 273 197   Basic Metabolic Panel: Recent Labs  Lab  04/07/21 0202 04/07/21 2449 04/08/21 7530 04/09/21 0515 04/10/21 0401 04/11/21 0455 04/12/21 2018 04/12/21 2321 04/13/21 0541  NA 125*   < > 127* 132* 132* 134* 132*  --  136  K 3.8  --  3.6 3.6 3.9 3.9 3.2*  --  3.9  CL 84*  --  87* 92* 94* 95* 93*  --  97*  CO2 31  --  34* 33* 34* 34* 31  --  35*  GLUCOSE 125*  --  127* 117* 112* 110* 116*  --  93  BUN 13  --  '12 14 11 12 16  ' --  13  CREATININE 0.92  --  0.65 0.60* 0.44* 0.55* 0.98  --  0.71  CALCIUM 8.5*  --  8.6* 8.7* 8.6* 8.7* 8.5*  --  8.7*  MG 1.7  --   --   --   --   --   --  2.0  --   PHOS 5.6*  --  2.7 3.6 4.5  --   --   --   --    < > = values in this interval not displayed.    Studies: DG Chest Portable 1 View  Result Date: 04/12/2021 CLINICAL DATA:  Shortness of breath. Diaphoresis and agonal respirations. EXAM: PORTABLE CHEST 1 VIEW COMPARISON:  Radiographs  04/07/2021 and 04/06/2021.  CT 04/05/2021. FINDINGS: 2041 hours. The heart size and mediastinal contours are stable. The previously demonstrated left basilar airspace opacities have largely cleared. There are residual patchy bibasilar pulmonary opacities. No pneumothorax or significant pleural effusion. Multiple telemetry leads overlie the chest. IMPRESSION: Interval partial clearing of left lung infiltrates consistent with improving pneumonia. No new findings. Electronically Signed   By: Richardean Sale M.D.   On: 04/12/2021 20:55    Scheduled Meds:  atorvastatin  20 mg Oral QHS   buPROPion ER  100 mg Oral Daily   cholecalciferol  1,000 Units Oral Daily   citric acid-potassium citrate  40 mEq Oral Once   cloZAPine  150 mg Oral QHS   docusate sodium  100 mg Oral BID   enoxaparin (LOVENOX) injection  40 mg Subcutaneous Q24H   ezetimibe  10 mg Oral QHS   guaiFENesin  600 mg Oral BID   mometasone-formoterol  2 puff Inhalation BID   pantoprazole  40 mg Oral BID   polyethylene glycol  17 g Oral Daily   sodium chloride  1 g Oral TID WC   Continuous Infusions: PRN Meds: acetaminophen **OR** acetaminophen, albuterol, bisacodyl, bisacodyl, diltiazem, hydrOXYzine, ipratropium-albuterol, nitroGLYCERIN, ondansetron **OR** ondansetron (ZOFRAN) IV  Time spent: 35 minutes  Author: Val Riles. MD [facility] 04/13/2021 2:36 PM  To reach On-call, see care teams to locate the attending and reach out to them via [email or web address]. If 7PM-7AM, please contact night-coverage If you still have difficulty reaching the attending provider, please page the [facility] (Director on Call) for [facility] on amion for assistance.

## 2021-04-14 ENCOUNTER — Inpatient Hospital Stay: Payer: Medicare Other

## 2021-04-14 DIAGNOSIS — Z91041 Radiographic dye allergy status: Secondary | ICD-10-CM | POA: Diagnosis not present

## 2021-04-14 DIAGNOSIS — K5900 Constipation, unspecified: Secondary | ICD-10-CM | POA: Diagnosis present

## 2021-04-14 DIAGNOSIS — F419 Anxiety disorder, unspecified: Secondary | ICD-10-CM | POA: Diagnosis present

## 2021-04-14 DIAGNOSIS — W19XXXA Unspecified fall, initial encounter: Secondary | ICD-10-CM | POA: Diagnosis not present

## 2021-04-14 DIAGNOSIS — J9691 Respiratory failure, unspecified with hypoxia: Secondary | ICD-10-CM | POA: Diagnosis present

## 2021-04-14 DIAGNOSIS — J9811 Atelectasis: Secondary | ICD-10-CM | POA: Diagnosis not present

## 2021-04-14 DIAGNOSIS — K2100 Gastro-esophageal reflux disease with esophagitis, without bleeding: Secondary | ICD-10-CM | POA: Diagnosis not present

## 2021-04-14 DIAGNOSIS — R262 Difficulty in walking, not elsewhere classified: Secondary | ICD-10-CM | POA: Diagnosis not present

## 2021-04-14 DIAGNOSIS — F32A Depression, unspecified: Secondary | ICD-10-CM | POA: Diagnosis not present

## 2021-04-14 DIAGNOSIS — Z20822 Contact with and (suspected) exposure to covid-19: Secondary | ICD-10-CM | POA: Diagnosis present

## 2021-04-14 DIAGNOSIS — N400 Enlarged prostate without lower urinary tract symptoms: Secondary | ICD-10-CM | POA: Diagnosis present

## 2021-04-14 DIAGNOSIS — J189 Pneumonia, unspecified organism: Secondary | ICD-10-CM | POA: Diagnosis present

## 2021-04-14 DIAGNOSIS — G9349 Other encephalopathy: Secondary | ICD-10-CM | POA: Diagnosis present

## 2021-04-14 DIAGNOSIS — J449 Chronic obstructive pulmonary disease, unspecified: Secondary | ICD-10-CM | POA: Diagnosis not present

## 2021-04-14 DIAGNOSIS — E785 Hyperlipidemia, unspecified: Secondary | ICD-10-CM | POA: Diagnosis present

## 2021-04-14 DIAGNOSIS — I10 Essential (primary) hypertension: Secondary | ICD-10-CM | POA: Diagnosis present

## 2021-04-14 DIAGNOSIS — I252 Old myocardial infarction: Secondary | ICD-10-CM | POA: Diagnosis not present

## 2021-04-14 DIAGNOSIS — R0902 Hypoxemia: Secondary | ICD-10-CM | POA: Diagnosis not present

## 2021-04-14 DIAGNOSIS — Z743 Need for continuous supervision: Secondary | ICD-10-CM | POA: Diagnosis not present

## 2021-04-14 DIAGNOSIS — R0602 Shortness of breath: Secondary | ICD-10-CM | POA: Diagnosis present

## 2021-04-14 DIAGNOSIS — J441 Chronic obstructive pulmonary disease with (acute) exacerbation: Secondary | ICD-10-CM | POA: Diagnosis present

## 2021-04-14 DIAGNOSIS — I2510 Atherosclerotic heart disease of native coronary artery without angina pectoris: Secondary | ICD-10-CM | POA: Diagnosis present

## 2021-04-14 DIAGNOSIS — Z882 Allergy status to sulfonamides status: Secondary | ICD-10-CM | POA: Diagnosis not present

## 2021-04-14 DIAGNOSIS — Z7401 Bed confinement status: Secondary | ICD-10-CM | POA: Diagnosis not present

## 2021-04-14 DIAGNOSIS — J9602 Acute respiratory failure with hypercapnia: Secondary | ICD-10-CM | POA: Diagnosis present

## 2021-04-14 DIAGNOSIS — Z79899 Other long term (current) drug therapy: Secondary | ICD-10-CM | POA: Diagnosis not present

## 2021-04-14 DIAGNOSIS — Z9151 Personal history of suicidal behavior: Secondary | ICD-10-CM | POA: Diagnosis not present

## 2021-04-14 DIAGNOSIS — E871 Hypo-osmolality and hyponatremia: Secondary | ICD-10-CM | POA: Diagnosis not present

## 2021-04-14 DIAGNOSIS — E876 Hypokalemia: Secondary | ICD-10-CM | POA: Diagnosis present

## 2021-04-14 DIAGNOSIS — K219 Gastro-esophageal reflux disease without esophagitis: Secondary | ICD-10-CM | POA: Diagnosis present

## 2021-04-14 DIAGNOSIS — Z955 Presence of coronary angioplasty implant and graft: Secondary | ICD-10-CM | POA: Diagnosis not present

## 2021-04-14 DIAGNOSIS — H4010X0 Unspecified open-angle glaucoma, stage unspecified: Secondary | ICD-10-CM | POA: Diagnosis not present

## 2021-04-14 DIAGNOSIS — F250 Schizoaffective disorder, bipolar type: Secondary | ICD-10-CM | POA: Diagnosis present

## 2021-04-14 DIAGNOSIS — R0603 Acute respiratory distress: Secondary | ICD-10-CM | POA: Diagnosis not present

## 2021-04-14 DIAGNOSIS — I700 Atherosclerosis of aorta: Secondary | ICD-10-CM | POA: Diagnosis not present

## 2021-04-14 DIAGNOSIS — J440 Chronic obstructive pulmonary disease with acute lower respiratory infection: Secondary | ICD-10-CM | POA: Diagnosis present

## 2021-04-14 DIAGNOSIS — J984 Other disorders of lung: Secondary | ICD-10-CM | POA: Diagnosis not present

## 2021-04-14 DIAGNOSIS — Z888 Allergy status to other drugs, medicaments and biological substances status: Secondary | ICD-10-CM | POA: Diagnosis not present

## 2021-04-14 DIAGNOSIS — J9601 Acute respiratory failure with hypoxia: Secondary | ICD-10-CM | POA: Diagnosis present

## 2021-04-14 DIAGNOSIS — M6281 Muscle weakness (generalized): Secondary | ICD-10-CM | POA: Diagnosis not present

## 2021-04-14 DIAGNOSIS — R498 Other voice and resonance disorders: Secondary | ICD-10-CM | POA: Diagnosis not present

## 2021-04-14 DIAGNOSIS — Z9049 Acquired absence of other specified parts of digestive tract: Secondary | ICD-10-CM | POA: Diagnosis not present

## 2021-04-14 LAB — CBC
HCT: 40.1 % (ref 39.0–52.0)
Hemoglobin: 12.6 g/dL — ABNORMAL LOW (ref 13.0–17.0)
MCH: 27.7 pg (ref 26.0–34.0)
MCHC: 31.4 g/dL (ref 30.0–36.0)
MCV: 88.1 fL (ref 80.0–100.0)
Platelets: 273 10*3/uL (ref 150–400)
RBC: 4.55 MIL/uL (ref 4.22–5.81)
RDW: 13.5 % (ref 11.5–15.5)
WBC: 8.1 10*3/uL (ref 4.0–10.5)
nRBC: 0 % (ref 0.0–0.2)

## 2021-04-14 LAB — BASIC METABOLIC PANEL
Anion gap: 5 (ref 5–15)
BUN: 8 mg/dL (ref 8–23)
CO2: 32 mmol/L (ref 22–32)
Calcium: 8.7 mg/dL — ABNORMAL LOW (ref 8.9–10.3)
Chloride: 97 mmol/L — ABNORMAL LOW (ref 98–111)
Creatinine, Ser: 0.54 mg/dL — ABNORMAL LOW (ref 0.61–1.24)
GFR, Estimated: >60 mL/min (ref >60)
Glucose, Bld: 99 mg/dL (ref 70–99)
Potassium: 4 mmol/L (ref 3.5–5.1)
Sodium: 134 mmol/L — ABNORMAL LOW (ref 135–145)

## 2021-04-14 LAB — LACTIC ACID, PLASMA
Lactic Acid, Venous: 0.8 mmol/L (ref 0.5–1.9)
Lactic Acid, Venous: 1.1 mmol/L (ref 0.5–1.9)

## 2021-04-14 LAB — MRSA NEXT GEN BY PCR, NASAL: MRSA by PCR Next Gen: NOT DETECTED

## 2021-04-14 LAB — PHOSPHORUS: Phosphorus: 3.9 mg/dL (ref 2.5–4.6)

## 2021-04-14 LAB — GLUCOSE, CAPILLARY: Glucose-Capillary: 114 mg/dL — ABNORMAL HIGH (ref 70–99)

## 2021-04-14 LAB — MAGNESIUM: Magnesium: 2.2 mg/dL (ref 1.7–2.4)

## 2021-04-14 MED ORDER — GUAIFENESIN-DM 100-10 MG/5ML PO SYRP
5.0000 mL | ORAL_SOLUTION | ORAL | Status: DC | PRN
  Administered 2021-04-14 – 2021-04-17 (×3): 5 mL via ORAL
  Filled 2021-04-14 (×3): qty 5

## 2021-04-14 MED ORDER — VANCOMYCIN HCL 1750 MG/350ML IV SOLN
1750.0000 mg | Freq: Once | INTRAVENOUS | Status: AC
  Administered 2021-04-14: 1750 mg via INTRAVENOUS
  Filled 2021-04-14: qty 350

## 2021-04-14 MED ORDER — PIPERACILLIN-TAZOBACTAM 3.375 G IVPB 30 MIN: 3.3750 g | Freq: Four times a day (QID) | INTRAVENOUS | Status: DC

## 2021-04-14 MED ORDER — SODIUM CHLORIDE 0.9 % IV SOLN: INTRAVENOUS | Status: AC

## 2021-04-14 MED ORDER — PIPERACILLIN-TAZOBACTAM 3.375 G IVPB
3.3750 g | Freq: Three times a day (TID) | INTRAVENOUS | Status: DC
  Administered 2021-04-14 – 2021-04-16 (×5): 3.375 g via INTRAVENOUS
  Filled 2021-04-14 (×5): qty 50

## 2021-04-14 MED ORDER — MIDODRINE HCL 5 MG PO TABS
10.0000 mg | ORAL_TABLET | Freq: Once | ORAL | Status: AC
  Administered 2021-04-14: 10 mg via ORAL
  Filled 2021-04-14: qty 2

## 2021-04-14 MED ORDER — SODIUM CHLORIDE 0.9 % IV BOLUS
500.0000 mL | Freq: Once | INTRAVENOUS | Status: AC
  Administered 2021-04-14: 500 mL via INTRAVENOUS

## 2021-04-14 MED ORDER — VANCOMYCIN HCL IN DEXTROSE 1-5 GM/200ML-% IV SOLN: 1000.0000 mg | Freq: Two times a day (BID) | INTRAVENOUS | Status: DC

## 2021-04-14 MED ORDER — SODIUM CHLORIDE 0.9 % IV BOLUS
1000.0000 mL | Freq: Once | INTRAVENOUS | Status: AC
  Administered 2021-04-14: 1000 mL via INTRAVENOUS

## 2021-04-14 MED ORDER — VANCOMYCIN HCL 1250 MG/250ML IV SOLN
1250.0000 mg | Freq: Two times a day (BID) | INTRAVENOUS | Status: DC
  Administered 2021-04-15: 1250 mg via INTRAVENOUS
  Filled 2021-04-14 (×3): qty 250

## 2021-04-14 NOTE — Progress Notes (Deleted)
   04/14/21 1636  Assess: MEWS Score  Temp 98.1 F (36.7 C)  BP (!) 78/61 (RN Elease Hashimoto in notified)  Pulse Rate 81  Resp (!) 22  Level of Consciousness Alert  SpO2 95 %  O2 Device Nasal Cannula  Assess: MEWS Score  MEWS Temp 0  MEWS Systolic 2  MEWS Pulse 0  MEWS RR 1  MEWS LOC 0  MEWS Score 3  MEWS Score Color Yellow  Assess: if the MEWS score is Yellow or Red  Were vital signs taken at a resting state? Yes  Focused Assessment No change from prior assessment  Does the patient meet 2 or more of the SIRS criteria? No  Does the patient have a confirmed or suspected source of infection? No  MEWS guidelines implemented *See Row Information* Yes  Treat  MEWS Interventions Escalated (See documentation below)  Take Vital Signs  Increase Vital Sign Frequency  Yellow: Q 2hr X 2 then Q 4hr X 2, if remains yellow, continue Q 4hrs  Escalate  MEWS: Escalate Yellow: discuss with charge nurse/RN and consider discussing with provider and RRT  Notify: Charge Nurse/RN  Name of Charge Nurse/RN Notified Tresa Endo RN  Date Charge Nurse/RN Notified 04/14/21  Time Charge Nurse/RN Notified 1640  Notify: Provider  Provider Name/Title Dr. Lucianne Muss  Date Provider Notified 04/14/21  Time Provider Notified [phone]  Notification Type Page  Notification Reason Change in status;Other (Comment) (yellow MEWS)  Assess: SIRS CRITERIA  SIRS Temperature  0  SIRS Pulse 0  SIRS Respirations  1  SIRS WBC 0  SIRS Score Sum  1

## 2021-04-14 NOTE — Evaluation (Signed)
Physical Therapy Evaluation Patient Details Name: Brandon [name] MRN: [id number] DOB: 10-Mar-1956 Today's Date: 04/14/2021   History of Present Illness  Brandon [name] is a 65 y.o. male with medical history significant for hypertension, CAD, COPD, hyperlipidemia, history of schizoaffective disorder bipolar 1, generalized weakness, debility, query early dementia, presents emergency department for chief concerns of shortness of breath and unresponsiveness.   Clinical Impression  Pt admitted with above diagnosis. Pt received supine in bed. Very pleasant, agreeable to PT eval. Resting on 3L/min via [place] SPO2 92% HR; 96-98 BPM. Pt from SNF due to recent acute admission here at [facility]. PLOF is amb with RW at SNF and [facility] at home where he lives alone prior to SNF. Limited to household ambulation, friends and brother helped with community errands I.e. groceries. Pt supervision for bed mobility with reports of minor dizziness that improves with seated. Pt requires VC's for safe hand placement on Rw and amb ~40' with RW. Unable to achieve BLE knee extension but able to imrpvoe with VC's. Per pt reports this is slower than baseline but how he was ambulating at SNF. HR up to 140 BPM and SPO2 to 88% requiring standing rest break ~30-45 sec. SpO2 > 90% with PLB very quickly. Pt returned to sitting EOB with HR in 110-115 BPM and SPO2 at 92-94%. Pt amb from EOB to recliner safely with all needs within reach. Educated to amb with nursing staff to improve endurance and strength. RN notified. Per vitals response to short ambulation distance, pt is significantly deconditioned. Would benefit from STR to improve strength/endurance prior to transitioning back to home environment. Pt currently with functional limitations due to the deficits listed below (see PT Problem List). Pt will benefit from skilled PT to increase their independence and safety with mobility to allow discharge to the venue listed below.     Follow Up  Recommendations SNF;Supervision for mobility/OOB    Equipment Recommendations  None recommended by PT    Recommendations for Other Services       Precautions / Restrictions Precautions Precautions: Fall Restrictions Weight Bearing Restrictions: No      Mobility  Bed Mobility Overal bed mobility: Needs Assistance Bed Mobility: Supine to Sit     Supine to sit: Supervision;HOB elevated     General bed mobility comments: use of bed rails    Transfers Overall transfer level: Needs assistance Equipment used: Rolling walker (2 wheeled) Transfers: Sit to/from Stand Sit to Stand: Min guard            Ambulation/Gait Ambulation/Gait assistance: Min guard Gait Distance (Feet): 40 Feet Assistive device: Rolling walker (2 wheeled) Gait Pattern/deviations: Step-to pattern;Trunk flexed     General Gait Details: Limited Knee extension bilat but able to imrpvoe with cuing.  Stairs            Wheelchair Mobility    Modified [name] (Stroke Patients Only)       Balance Overall balance assessment: Needs assistance Sitting-balance support: Bilateral upper extremity supported;Feet supported Sitting balance-[name] Scale: Good     Standing balance support: Bilateral upper extremity supported;During functional activity Standing balance-[name] Scale: Fair Standing balance comment: use of RW                             Pertinent Vitals/Pain Pain Assessment: No/denies pain    Home Living Family/patient expects to be discharged to:: Skilled nursing facility Living Arrangements: Alone Available Help at Discharge: Family;Friend(s);Available PRN/intermittently Type  of Home: Apartment Home Access: Stairs to enter Entrance Stairs-Rails: Left Entrance Stairs-Number of Steps: 5-6 Home Layout: One level Home Equipment: Walker - 2 wheels;Cane - single point Additional Comments: previously lived alone, however has been in SNF for past 2 weeks    Prior  Function Level of Independence: Independent with assistive device(s)         Comments: Ambulatory with SPC in home, RW in community. Has been using RW at [facility]     Hand Dominance        Extremity/Trunk Assessment   Upper Extremity Assessment Upper Extremity Assessment: Overall WFL for tasks assessed    Lower Extremity Assessment Lower Extremity Assessment: Generalized weakness    Cervical / Trunk Assessment Cervical / Trunk Assessment: Kyphotic  Communication   Communication: No difficulties  Cognition Arousal/Alertness: Awake/alert Behavior During Therapy: WFL for tasks assessed/performed Overall Cognitive Status: Within Functional Limits for tasks assessed                                 General Comments: Pleasant & agreeable to tx.      General Comments General comments (skin integrity, edema, etc.): Desat to 88% on 3 L/min. Returns > 90% quickly. HR up to 140 BPM with short distance ambulation.    Exercises Other Exercises Other Exercises: Role of acute PT, d/c recs. Safe use of DME.   Assessment/Plan    PT Assessment Patient needs continued PT services  PT Problem List Decreased strength;Decreased mobility;Decreased balance;Decreased activity tolerance       PT Treatment Interventions DME instruction;Therapeutic activities;Modalities;Gait training;Therapeutic exercise;Patient/family education;Stair training;Balance training;Functional mobility training;Manual techniques;Neuromuscular re-education    PT Goals (Current goals can be found in the Care Plan section)  Acute Rehab PT Goals Patient Stated Goal: eventually get back home PT Goal Formulation: With patient Time For Goal Achievement: 04/28/21 Potential to Achieve Goals: Fair    Frequency Min 2X/week   Barriers to discharge Decreased caregiver support;Inaccessible home environment Lives alone, stairs to enter home.    Co-evaluation               AM-PAC PT "6 Clicks" Mobility   Outcome Measure Help needed turning from your back to your side while in a flat bed without using bedrails?: A Little Help needed moving from lying on your back to sitting on the side of a flat bed without using bedrails?: A Little Help needed moving to and from a bed to a chair (including a wheelchair)?: A Little Help needed standing up from a chair using your arms (e.g., wheelchair or bedside chair)?: A Little Help needed to walk in hospital room?: A Little Help needed climbing 3-5 steps with a railing? : A Lot 6 Click Score: 17    End of Session Equipment Utilized During Treatment: Gait belt Activity Tolerance: Patient tolerated treatment well Patient left: in chair;with chair alarm set;with call bell/phone within reach Nurse Communication: Mobility status PT Visit Diagnosis: Unsteadiness on feet (R26.81);Muscle weakness (generalized) (M62.81);Difficulty in walking, not elsewhere classified (R26.2)    Time: 3664-4034 PT Time Calculation (min) (ACUTE ONLY): 28 min   Charges:   PT Evaluation $PT Eval Moderate Complexity: 1 Mod PT Treatments $Gait Training: 8-22 mins $Therapeutic Activity: 8-22 mins        Eryn Krejci M. Fairly IV, PT, DPT Physical Therapist- [place]  [facility]  04/14/2021, 10:38 AM

## 2021-04-14 NOTE — Progress Notes (Signed)
OT Cancellation Note  Patient Details Name: STACEY MAURA MRN: [id number] DOB: 07-10-1956   Cancelled Treatment:    Reason Eval/Treat Not Completed: Medical issues which prohibited therapy. Order received and chart reviewed. Upon arrival to room, pt with BP 79/59 and pt reporting "just feeling so run down". Pt politely declining OT eval/tx this date and requesting to re-attempt tomorrow. OT to re-attempt at later time/date as able.   Matthew Folks, OTR/L [facility] [phone]

## 2021-04-14 NOTE — Progress Notes (Signed)
Attempted to obtain orthostatic VS. Initial BP while patient lying in bed 79/59 and patient c/o "not feeling well." MD made aware, orthostatic VS unable to be obtained safely at this time.

## 2021-04-14 NOTE — Progress Notes (Signed)
PHARMACY NOTE:  ANTIMICROBIAL RENAL DOSAGE ADJUSTMENT  Current antimicrobial regimen includes a mismatch between antimicrobial dosage and estimated renal function.  As per policy approved by the Pharmacy & Therapeutics and Medical Executive Committees, the antimicrobial dosage will be adjusted accordingly.  Current antimicrobial dosage:  Zosyn 3.375 g IV q6h  Indication: CAP  Renal Function:  Estimated Creatinine Clearance: 101 mL/min (A) (by C-G formula based on SCr of 0.54 mg/dL (L)). []      On intermittent HD, scheduled: []      On CRRT    Antimicrobial dosage has been changed to:  Zosyn 3.375 g IV q8h EI  Additional comments:   Thank you for allowing pharmacy to be a part of this patient's care.  Rifka Ramey, [facility] 04/14/2021 6:40 PM

## 2021-04-14 NOTE — Progress Notes (Signed)
[facility] Progress Note  Patient: Brandon [name]    WYO:[id number]  DOA: 04/12/2021     Date of Service: the patient was seen and examined on 04/14/2021  Chief Complaint  Patient presents with   Altered Mental Status   Respiratory Distress   Brief hospital course: Brandon [name] is a 65 y.o. male with medical history significant for hypertension, CAD, COPD, hyperlipidemia, history of schizoaffective disorder bipolar 1, generalized weakness, debility, query early dementia, presents emergency department for chief concerns of shortness of breath and unresponsiveness.   Per [facility], patient was unresponsive and facility called EMS.  However, chest compression and or resuscitative measures was not done prior to EMS arrival.  On arrival EMS gave patient duo nebs and albuterol with appropriate responsiveness.   It was reported that he was hypoxic, with sats in the 70s and patient was placed on nonrebreather and had agonal breathing.   At bedside patient was able to tell me his name, his age correctly.  He was not able to tell me his location.  He was snoring in between answers.  When I asked him if he was having any shortness of breath he states yes and then he states "I do not know".   He goes back to snoring and I was not able to ask if he had any chest pain or abdominal pain.  He is maintaining airway and does not appear to be in any acute distress.  ED Course: Discussed with emergency medicine provider, patient requiring hospitalization for chief concerns of acute hypoxic respiratory failure. Vitals in the emergency department was remarkable for temperature 98.6, respiration of 22, heart rate 86, blood pressure 1/58, SPO2 100% on 4 L nasal cannula. Labs in the emergency department was remarkable for sodium 132, potassium 3.2, chloride 93, bicarb 31, BUN of 16, serum creatinine of 0.98, nonfasting blood glucose 116, WBC 11.2, hemoglobin 12.5, platelets 273.  eGFR greater than 60.   COVID was negative.   In the emergency department patient was given sodium chloride 500 mL bolus.   Assessment and Plan: Principal Problem:   Shortness of breath Active Problems:   CAD (coronary artery disease)   Essential hypertension   Hyperlipidemia   COPD (chronic obstructive pulmonary disease) (HCC)   Schizoaffective disorder, bipolar type (HCC)   Weakness   Schizophrenia (HCC)   BPH (benign prostatic hyperplasia)    # Acute hypoxic and hypercapnic respiratory failure, patient was found unresponsive and O2 saturation was in 70s, patient responded well to [facility] treatment given by EMS and patient was placed on non-rebreather.  ABG consistent with hypoxia and hypercapnia -Chest x-ray was read as clearing and improving left lung infiltrate --pro-calcitonin negative TSH 1.9 wnl -Initial troponin high-sensitivity was 11, with reassuring EKG -Low clinical suspicion for ACS -CBC in the a.m. -I am holding trazodone nightly at this time due to reported unresponsiveness Continue Dulera inhaler and DuoNeb  9/3 O2 sat 93% at rest and 88% while ambulation on room air, on 2 L oxygen via nasal cannula oxygen 94% while ambulation. 9/4 currently patient is on 3 L oxygen and saturating 94% at rest    # COPD-resumed home inhalers, and albuterol 2 puffs inhalation every 6 hours, for shortness of breath and wheezing - Added duo nebs, every 6 hours as needed for shortness of breath and wheezing Will hold off systemic steroids at this time Continue Mucinex twice daily and Robitussin-DM as needed  # Hyperlipidemia-atorvastatin 20 mg nightly, ezetimibe 10 mg nightly   #  GERD-PPI   # Hypokalemia-K repleted     # Hypotension, History of hypertension-holding home amlodipine 5 mg daily at this time, carvedilol 25 mg twice daily, furosemide 20 mg daily, isosorbide mononitrate 30 mg twice daily,  due to initial hypotensive - Diltiazem 5 mg IV every 2 hours as needed for sbp > 175, 3 doses  ordered Blood pressure is still soft, we will continue to hold antihypertensive medications for now 9/4 patient was feeling lightheaded after working with PT getting out of bed.  RN was advised to check orthostatics   # Depression/anxiety/psychiatric imbalance-bupropion 100 mg daily, clozapine 150 mg nightly, hydroxyzine 25 mg 3 times daily as needed for anxiety   # Constipation-Colace and senna   # Anxiety, resumed Klonopin and started Atarax 25 mg p.o. 3 times daily   Body mass index is 24.07 kg/m.  Interventions:       Diet: Heart healthy DVT Prophylaxis: Subcutaneous Lovenox   Advance goals of care discussion: Full code  Family Communication: family was not present at bedside, at the time of interview.  The pt provided permission to discuss medical plan with the family. Opportunity was given to ask question and all questions were answered satisfactorily.   Disposition:  Pt is from SNF, admitted with acute hypoxic and hypercapnic respiratory failure, still has respiratory failure, required 2 L oxygen via nasal cannula, which precludes a safe discharge. Discharge to SNF, when hypoxia will resolve most likely in 1 to 2 days.  Patient will need PT and OT eval to return back to SNF.  Social worker and case manager aware  Subjective: No significant overnight events, patient was feeling lightheadedness after working with physical therapy possible secondary to orthostatics, patient was feeling some congestion and asking for medication.  Patient was advised to take Mucinex and Robitussin-DM as needed. Patient denied any worsening of shortness of breath, no chest pain or palpitations, no new abdominal pain, no nausea vomiting or diarrhea.   Physical Exam: General:  alert oriented to time, place, and person.  Appear in mild distress, affect appropriate Eyes: PERRLA ENT: Oral Mucosa Clear, moist  Neck: no JVD,  Cardiovascular: S1 and S2 Present, no Murmur,  Respiratory: good  respiratory effort, Bilateral Air entry equal, mild bilateral crackles and significant wheezing appreciated  Abdomen: Bowel Sound present, Soft and no tenderness,  Skin: no rashes Extremities: no Pedal edema, no calf tenderness Neurologic: without any new focal findings Gait not checked due to patient safety concerns  Vitals:   04/14/21 0428 04/14/21 0457 04/14/21 0758 04/14/21 1001  BP: 120/74 126/83 132/88 95/69  Pulse: 86 86 90 92  Resp: (!) 24 (!) _0 Temp: 98.1 F (36.7 C) 98.8 F (37.1 C) 98 F (36.7 C)   TempSrc: Oral Oral Oral   SpO2: 96% (!) 89% 92% 94%  Weight:      Height:        Intake/Output Summary (Last 24 hours) at 04/14/2021 1323 Last data filed at 04/14/2021 0900 Gross per 24 hour  Intake --  Output 1500 ml  Net -1500 ml   Filed Weights   04/12/21 2012  Weight: 80.5 kg    Data Reviewed: I have personally reviewed and interpreted daily labs, tele strips, imagings as discussed above. I reviewed all nursing notes, pharmacy notes, vitals, pertinent old records I have discussed plan of care as described above with RN and patient/family.  CBC: Recent Labs  Lab 04/08/21 2197 04/09/21 0515 04/10/21 0401 04/12/21 2018 04/13/21 0541  04/14/21 0520  WBC 9.9 9.9 8.0 11.2* 9.5 8.1  NEUTROABS 9.0* 9.1* 7.2 8.5*  --   --   HGB 12.0* 11.2* 10.9* 12.5* 13.0 12.6*  HCT 35.2* 34.2* 33.8* 39.2 41.2 40.1  MCV 86.7 87.5 86.9 89.3 88.0 88.1  PLT 252 265 275 273 274 149   Basic Metabolic Panel: Recent Labs  Lab 04/08/21 0620 04/09/21 0515 04/10/21 0401 04/11/21 0455 04/12/21 2018 04/12/21 2321 04/13/21 0541 04/14/21 0520  NA 127* 132* 132* 134* 132*  --  136 134*  K 3.6 3.6 3.9 3.9 3.2*  --  3.9 4.0  CL 87* 92* 94* 95* 93*  --  97* 97*  CO2 34* 33* 34* 34* 31  --  35* 32  GLUCOSE 127* 117* 112* 110* 116*  --  93 99  BUN _0 --  13 8  CREATININE 0.65 0.60* 0.44* 0.55* 0.98  --  0.71 0.54*  CALCIUM 8.6* 8.7* 8.6* 8.7* 8.5*  --  8.7* 8.7*   MG  --   --   --   --   --  2.0  --  2.2  PHOS 2.7 3.6 4.5  --   --   --   --  3.9    Studies: No results found.  Scheduled Meds:  atorvastatin  20 mg Oral QHS   buPROPion ER  100 mg Oral Daily   cholecalciferol  1,000 Units Oral Daily   citric acid-potassium citrate  40 mEq Oral Once   cloZAPine  150 mg Oral QHS   docusate sodium  100 mg Oral BID   enoxaparin (LOVENOX) injection  40 mg Subcutaneous Q24H   ezetimibe  10 mg Oral QHS   guaiFENesin  600 mg Oral BID   hydrOXYzine  25 mg Oral TID   mometasone-formoterol  2 puff Inhalation BID   pantoprazole  40 mg Oral BID   polyethylene glycol  17 g Oral Daily   sodium chloride  1 g Oral TID WC   tamsulosin  0.4 mg Oral Daily   Continuous Infusions: PRN Meds: acetaminophen **OR** acetaminophen, albuterol, bisacodyl, bisacodyl, diltiazem, guaiFENesin-dextromethorphan, hydrOXYzine, ipratropium-albuterol, nitroGLYCERIN, ondansetron **OR** ondansetron (ZOFRAN) IV  Time spent: 35 minutes  Author: Val Riles. MD [facility] 04/14/2021 1:23 PM  To reach On-call, see care teams to locate the attending and reach out to them via [email or web address]. If 7PM-7AM, please contact night-coverage If you still have difficulty reaching the attending provider, please page the [facility] (Director on Call) for [facility] on amion for assistance.

## 2021-04-14 NOTE — Progress Notes (Signed)
Pharmacy Antibiotic Note  Brandon [name] is a 65 y.o. male 65 y.o. male with medical history significant for hypertension, CAD, COPD, hyperlipidemia, history of schizoaffective disorder bipolar 1, generalized weakness, debility, query early dementia, presents emergency department for chief concerns of shortness of breath and unresponsiveness. Pt was recently admitted 8/27 and received 5 days of antibiotics (Unasyn). Pharmacy has been consulted for vancomycin dosing for possible PNA.  Plan: Will give vancomycin 1750 mg IV x 1 loading dose followed by 1250 mg IV q12h Est AUC: 488 Scr used 0.8, Vd 0.72, IBW for Crcl/ke Obtain vancomycin levels around 4th or 5th dose if continued Monitor renal function and adjust dose as clinically indicated Follow up MRSA PCR   Height: 6' (182.9 cm) Weight: 80.5 kg (177 lb 7.5 oz) IBW/kg (Calculated) : 77.6  Temp (24hrs), Avg:98.1 F (36.7 C), Min:97.1 F (36.2 C), Max:98.8 F (37.1 C)  Recent Labs  Lab 04/09/21 0515 04/10/21 0401 04/11/21 0455 04/12/21 2018 04/13/21 0541 04/14/21 0520  WBC 9.9 8.0  --  11.2* 9.5 8.1  CREATININE 0.60* 0.44* 0.55* 0.98 0.71 0.54*    Estimated Creatinine Clearance: 101 mL/min (A) (by C-G formula based on SCr of 0.54 mg/dL (L)).    Allergies  Allergen Reactions   Compazine [Prochlorperazine] Other (See Comments)    Dystonic rxn - convulsions. Near fatal reaction.    Ivp Dye [Iodinated Diagnostic Agents] Shortness Of Breath    Pt states SOB after last IV contrast injection   Alcohol-Sulfur [Elemental Sulfur] Other (See Comments)    History of alcoholism   Benadryl [Diphenhydramine] Other (See Comments)    "stuffy", nasal congestion   Depakote [Divalproex Sodium] Other (See Comments)    Cause elevated ammonia   Dramamine [Dimenhydrinate] Swelling   Plavix [Clopidogrel] Other (See Comments)    Rectal bleeding. "Perforated my intestines."   Rosuvastatin     Other reaction(s): Other (See Comments)     Antimicrobials this admission: 9/4 vancomycin >> 9/4 zosyn >>   Dose adjustments this admission:   Microbiology results: 9/4 MRSA PCR: sent  Thank you for allowing pharmacy to be a part of this patient's care.  Robyne Peers [name] 04/14/2021 6:31 PM

## 2021-04-14 NOTE — NC FL2 (Signed)
[place] MEDICAID FL2 LEVEL OF CARE SCREENING TOOL     IDENTIFICATION  Patient Name: Brandon [name] Birthdate: 10/04/1955 Sex: male Admission Date (Current Location): 04/12/2021  [facility] and [place] Number:  Chiropodist and Address:  [facility], [address] [place], [place] [address]      Provider Number: [id number]  Attending Physician Name and Address:  Gillis Santa, MD  Relative Name and Phone Number:  Zoey, Gilkeson (Brother)   [phone] [facility] Phone)    Current Level of Care: Hospital Recommended Level of Care: Skilled Nursing Facility Prior Approval Number:    Date Approved/Denied:   PASRR Number: [id number]  Discharge Plan:      Current Diagnoses: Patient Active Problem List   Diagnosis Date Noted   Respiratory failure with hypoxia and hypercapnia (HCC) 04/14/2021   Shortness of breath 04/12/2021   Acute respiratory failure with hypoxia (HCC)    Aspiration pneumonia of both lungs due to gastric secretions (HCC)    Lobar pneumonia, unspecified organism (HCC)    Vitamin D deficiency    Epigastric abdominal pain    Recurrent falls    Hyponatremia 03/22/2021   BPH (benign prostatic hyperplasia) 03/22/2021   Weakness    Schizophrenia (HCC)    Acute hyponatremia 11/27/2020   Appendicitis with perforation 07/17/2018   Acute cholecystitis 04/10/2018   Chronic cholecystitis 04/08/2018   Atherosclerosis of native arteries of extremity with intermittent claudication (HCC) 11/22/2017   Essential hypertension 11/22/2017   Hyperlipidemia 11/22/2017   COPD (chronic obstructive pulmonary disease) (HCC) 11/22/2017   CAD (coronary artery disease) 10/15/2017   Other toe(s) amputation status 11/17/2014   Schizoaffective disorder, bipolar type (HCC) 08/23/2008    Orientation RESPIRATION BLADDER Height & Weight     Self, Time, Situation, Place  O2 External catheter Weight: 177 lb 7.5 oz (80.5 kg) Height:  6' (182.9 cm)   BEHAVIORAL SYMPTOMS/MOOD NEUROLOGICAL BOWEL NUTRITION STATUS      Continent Diet (heart diet, thin liquids)  AMBULATORY STATUS COMMUNICATION OF NEEDS Skin   Limited Assist Verbally Skin abrasions, Bruising                       Personal Care Assistance Level of Assistance  Bathing, Feeding, Dressing Bathing Assistance: Limited assistance Feeding assistance: Independent Dressing Assistance: Limited assistance     Functional Limitations Info             SPECIAL CARE FACTORS FREQUENCY  PT (By licensed PT), OT (By licensed OT)     PT Frequency: 5 times per week OT Frequency: 5 times per week            Contractures      Additional Factors Info  Code Status, Allergies Code Status Info: full Allergies Info: Allergies: Compazine (Prochlorperazine), Ivp Dye (Iodinated Diagnostic Agents), Alcohol-sulfur (Elemental Sulfur), Benadryl (Diphenhydramine), Depakote (Divalproex Sodium), Dramamine (Dimenhydrinate), Plavix (Clopidogrel), Rosuvastatin           Current Medications (04/14/2021):  This is the current hospital active medication list Current Facility-Administered Medications  Medication Dose Route Frequency Provider Last Rate Last Admin   0.9 %  sodium chloride infusion   Intravenous Continuous Gillis Santa, MD       acetaminophen (TYLENOL) tablet 650 mg  650 mg Oral Q6H PRN Cox, Amy N, DO   650 mg at 04/14/21 1216   Or   acetaminophen (TYLENOL) suppository 650 mg  650 mg Rectal Q6H PRN Cox, Amy N, DO  albuterol (PROVENTIL) (2.5 MG/3ML) 0.083% nebulizer solution 2.5 mg  2.5 mg Inhalation Q6H PRN Cox, Amy N, DO   2.5 mg at 04/14/21 0230   atorvastatin (LIPITOR) tablet 20 mg  20 mg Oral QHS Cox, Amy N, DO   20 mg at 04/13/21 2049   bisacodyl (DULCOLAX) EC tablet 5 mg  5 mg Oral Daily PRN Gillis Santa, MD       bisacodyl (DULCOLAX) suppository 10 mg  10 mg Rectal Daily PRN Gillis Santa, MD       buPROPion ER [facility] SR) 12 hr tablet 100 mg  100 mg Oral  Daily Cox, Amy N, DO   100 mg at 04/14/21 0840   cholecalciferol (VITAMIN D3) tablet 1,000 Units  1,000 Units Oral Daily Cox, Amy N, DO   1,000 Units at 04/14/21 0839   citric acid-potassium citrate (POLYCITRA) 10 mEq/5 ml solution 40 mEq  40 mEq Oral Once Cox, Amy N, DO       cloZAPine (CLOZARIL) tablet 150 mg  150 mg Oral QHS Cox, Amy N, DO   150 mg at 04/13/21 2048   diltiazem (CARDIZEM) injection 5 mg  5 mg Intravenous Q2H PRN Cox, Amy N, DO       docusate sodium (COLACE) capsule 100 mg  100 mg Oral BID Cox, Amy N, DO   100 mg at 04/14/21 0839   enoxaparin (LOVENOX) injection 40 mg  40 mg Subcutaneous Q24H Cox, Amy N, DO   40 mg at 04/14/21 0840   ezetimibe (ZETIA) tablet 10 mg  10 mg Oral QHS Cox, Amy N, DO   10 mg at 04/13/21 2047   guaiFENesin (MUCINEX) 12 hr tablet 600 mg  600 mg Oral BID Cox, Amy N, DO   600 mg at 04/14/21 0839   guaiFENesin-dextromethorphan (ROBITUSSIN DM) 100-10 MG/5ML syrup 5 mL  5 mL Oral Q4H PRN Gillis Santa, MD   5 mL at 04/14/21 1004   hydrOXYzine (ATARAX/VISTARIL) tablet 25 mg  25 mg Oral TID PRN Cox, Amy N, DO   25 mg at 04/13/21 1155   hydrOXYzine (ATARAX/VISTARIL) tablet 25 mg  25 mg Oral TID Gillis Santa, MD   25 mg at 04/14/21 0839   ipratropium-albuterol (DUONEB) 0.5-2.5 (3) MG/3ML nebulizer solution 3 mL  3 mL Nebulization Q6H PRN Cox, Amy N, DO       mometasone-formoterol (DULERA) 100-5 MCG/ACT inhaler 2 puff  2 puff Inhalation BID Cox, Amy N, DO   2 puff at 04/14/21 1202   nitroGLYCERIN (NITROSTAT) SL tablet 0.4 mg  0.4 mg Sublingual Q5 min PRN Cox, Amy N, DO       ondansetron (ZOFRAN) tablet 4 mg  4 mg Oral Q6H PRN Cox, Amy N, DO       Or   ondansetron (ZOFRAN) injection 4 mg  4 mg Intravenous Q6H PRN Cox, Amy N, DO   4 mg at 04/14/21 1214   pantoprazole (PROTONIX) EC tablet 40 mg  40 mg Oral BID Cox, Amy N, DO   40 mg at 04/14/21 1003   polyethylene glycol (MIRALAX / GLYCOLAX) packet 17 g  17 g Oral Daily Gillis Santa, MD   17 g at 04/14/21 0840    sodium chloride tablet 1 g  1 g Oral TID WC Cox, Amy N, DO   1 g at 04/14/21 1200   tamsulosin (FLOMAX) capsule 0.4 mg  0.4 mg Oral Daily Gillis Santa, MD   0.4 mg at 04/14/21 4888     Discharge  Medications: Please see discharge summary for a list of discharge medications.  Relevant Imaging Results:  Relevant Lab Results:   Additional Information SS #: [id number]  Chanele Douglas E Ember Gottwald, LCSW

## 2021-04-14 NOTE — Progress Notes (Signed)
Called into patient's room after patient worked with PT to "check his BP." When this nurse entered and talked with the patient, he c/o being lightheaded after walking with PT, currently sitting in the chair. The patient stated that his BP drops if he exercises after he eats. BP 95/69, HR 92, O2 94% on 3L. CBG checked and was 114. Dr. Lucianne Muss made aware, advised that no antihypertensive meds be given-- none scheduled at this time. Patient reminded to call if he needed to get up, will continue to monitor.

## 2021-04-14 NOTE — Progress Notes (Signed)
   04/14/21 1430  Assess: MEWS Score  Temp 97.8 F (36.6 C)  BP (!) 79/59  Pulse Rate 86  Resp 18  Level of Consciousness Alert  SpO2 95 %  O2 Device Nasal Cannula  O2 Flow Rate (L/min) 3 L/min  Assess: MEWS Score  MEWS Temp 0  MEWS Systolic 2  MEWS Pulse 0  MEWS RR 0  MEWS LOC 0  MEWS Score 2  MEWS Score Color Yellow  Assess: if the MEWS score is Yellow or Red  Were vital signs taken at a resting state? Yes  Focused Assessment Change from prior assessment (see assessment flowsheet)  Does the patient meet 2 or more of the SIRS criteria? No  Does the patient have a confirmed or suspected source of infection? No  MEWS guidelines implemented *See Row Information* Yes  Take Vital Signs  Increase Vital Sign Frequency  Yellow: Q 2hr X 2 then Q 4hr X 2, if remains yellow, continue Q 4hrs  Escalate  MEWS: Escalate Yellow: discuss with charge nurse/RN and consider discussing with provider and RRT  Notify: Charge Nurse/RN  Name of Charge Nurse/RN Notified Tresa Endo RN  Date Charge Nurse/RN Notified 04/14/21  Time Charge Nurse/RN Notified 1433  Notify: Provider  Provider Name/Title Levada Dy  Date Provider Notified 04/14/21  Time Provider Notified 1430  Notification Type Page  Notification Reason Change in status (yellow MEWS)  Assess: SIRS CRITERIA  SIRS Temperature  0  SIRS Pulse 0  SIRS Respirations  0  SIRS WBC 0  SIRS Score Sum  0

## 2021-04-14 NOTE — Progress Notes (Signed)
   04/14/21 1636  Assess: MEWS Score  Temp 98.1 F (36.7 C)  BP (!) 78/61  Pulse Rate 81  Resp (!) 22  Level of Consciousness Alert  SpO2 95 %  O2 Device Nasal Cannula  Assess: MEWS Score  MEWS Temp 0  MEWS Systolic 2  MEWS Pulse 0  MEWS RR 1  MEWS LOC 0  MEWS Score 3  MEWS Score Color Yellow  Assess: if the MEWS score is Yellow or Red  Were vital signs taken at a resting state? Yes  Focused Assessment No change from prior assessment  Does the patient meet 2 or more of the SIRS criteria? No  Does the patient have a confirmed or suspected source of infection? No  MEWS guidelines implemented *See Row Information* Yes  Treat  MEWS Interventions Escalated (See documentation below)  Take Vital Signs  Increase Vital Sign Frequency  Yellow: Q 2hr X 2 then Q 4hr X 2, if remains yellow, continue Q 4hrs  Escalate  MEWS: Escalate Yellow: discuss with charge nurse/RN and consider discussing with provider and RRT  Notify: Charge Nurse/RN  Name of Charge Nurse/RN Notified Tresa Endo RN  Date Charge Nurse/RN Notified 04/14/21  Time Charge Nurse/RN Notified 1640  Notify: Provider  Provider Name/Title Dr. Lucianne Muss  Date Provider Notified 04/14/21  Time Provider Notified [phone]  Notification Type Page  Notification Reason Change in status;Other (Comment) (yellow MEWS)  Assess: SIRS CRITERIA  SIRS Temperature  0  SIRS Pulse 0  SIRS Respirations  1  SIRS WBC 0  SIRS Score Sum  1

## 2021-04-14 NOTE — Progress Notes (Signed)
Patient refused CPAP for the night  

## 2021-04-14 NOTE — TOC Progression Note (Addendum)
Transition of Care [facility]) - Progression Note    Patient Details  Name: Brandon [name] MRN: [id number] Date of Birth: [birth year]  Transition of [facility]) CM/SW Contact  Liliana Cline, LCSW Phone Number: 04/14/2021, 3:08 PM  Clinical Narrative:   Patient back to [facility] from [facility] short term rehab. PT evaluated today and recommended SNF for more rehab. Started work up. TOC will follow for bed offers and to start auth when patient is medically ready.         Expected Discharge Plan and Services           Expected Discharge Date: 04/15/21                                     Social Determinants of Health (SDOH) Interventions    Readmission Risk Interventions Readmission Risk Prevention Plan 04/09/2021 03/24/2021  Transportation Screening Complete Complete  PCP or Specialist Appt within 5-7 Days - Complete  Home Care Screening - Complete  Medication Review (RN CM) - Complete  Medication Review (RN Care Manager) Complete -  PCP or Specialist appointment within 3-5 days of discharge Complete -  HRI or Home Care Consult Complete -  SW Recovery Care/Counseling Consult Complete -  Palliative Care Screening Not Applicable -  Skilled Nursing Facility Complete -  Some recent data might be hidden

## 2021-04-15 ENCOUNTER — Inpatient Hospital Stay: Payer: Medicare Other

## 2021-04-15 DIAGNOSIS — J449 Chronic obstructive pulmonary disease, unspecified: Secondary | ICD-10-CM

## 2021-04-15 DIAGNOSIS — I10 Essential (primary) hypertension: Secondary | ICD-10-CM

## 2021-04-15 DIAGNOSIS — J9601 Acute respiratory failure with hypoxia: Secondary | ICD-10-CM

## 2021-04-15 DIAGNOSIS — J9602 Acute respiratory failure with hypercapnia: Secondary | ICD-10-CM

## 2021-04-15 LAB — CBC
HCT: 35.7 % — ABNORMAL LOW (ref 39.0–52.0)
Hemoglobin: 11.1 g/dL — ABNORMAL LOW (ref 13.0–17.0)
MCH: 28.8 pg (ref 26.0–34.0)
MCHC: 31.1 g/dL (ref 30.0–36.0)
MCV: 92.5 fL (ref 80.0–100.0)
Platelets: 244 10*3/uL (ref 150–400)
RBC: 3.86 MIL/uL — ABNORMAL LOW (ref 4.22–5.81)
RDW: 13.8 % (ref 11.5–15.5)
WBC: 8.1 10*3/uL (ref 4.0–10.5)
nRBC: 0 % (ref 0.0–0.2)

## 2021-04-15 LAB — BASIC METABOLIC PANEL
Anion gap: 4 — ABNORMAL LOW (ref 5–15)
BUN: 8 mg/dL (ref 8–23)
CO2: 32 mmol/L (ref 22–32)
Calcium: 8.2 mg/dL — ABNORMAL LOW (ref 8.9–10.3)
Chloride: 100 mmol/L (ref 98–111)
Creatinine, Ser: 0.55 mg/dL — ABNORMAL LOW (ref 0.61–1.24)
GFR, Estimated: >60 mL/min (ref >60)
Glucose, Bld: 99 mg/dL (ref 70–99)
Potassium: 3.9 mmol/L (ref 3.5–5.1)
Sodium: 136 mmol/L (ref 135–145)

## 2021-04-15 LAB — SODIUM, URINE, RANDOM: Sodium, Ur: 112 mmol/L

## 2021-04-15 LAB — PHOSPHORUS: Phosphorus: 3.5 mg/dL (ref 2.5–4.6)

## 2021-04-15 LAB — CREATININE, URINE, RANDOM: Creatinine, Urine: 50 mg/dL

## 2021-04-15 LAB — MAGNESIUM: Magnesium: 1.8 mg/dL (ref 1.7–2.4)

## 2021-04-15 LAB — OSMOLALITY, URINE: Osmolality, Ur: 439 mOsm/kg (ref 300–900)

## 2021-04-15 NOTE — Progress Notes (Signed)
PROGRESS NOTE    Brandon [name]  GQQ:[id number] DOB: Sep 15, 1955 DOA: 04/12/2021 PCP: Jodi Marble, MD   Brief Narrative: Brandon [name] is a 65 y.o. male with history of CAD, COPD, hyperlipidemia, schizoaffective disorder, bipolar 1 disorder, generalized weakness, debility.  Patient presented secondary to shortness of breath and unresponsiveness found to have acute respite failure with hypoxia and hypercapnia.  Patient was treated with oxygen in addition to receiving DuoNeb treatment for possible COPD exacerbation.   Assessment & Plan:   Principal Problem:   Shortness of breath Active Problems:   CAD (coronary artery disease)   Essential hypertension   Hyperlipidemia   COPD (chronic obstructive pulmonary disease) (HCC)   Schizoaffective disorder, bipolar type (HCC)   Weakness   Schizophrenia (HCC)   BPH (benign prostatic hyperplasia)   Respiratory failure with hypoxia and hypercapnia (HCC)   Acute respiratory failure with hypoxia and hypercapnia In setting of recent pneumonia.  ABG significant for mild hypercapnia with normal pH.  Patient initially required nonrebreather and then weaned down to oxygen via nasal cannula.  Chest x-ray this admission shows improved infiltrates.  PT recommending SNF on discharge -Wean oxygen to room air as able  Possible pneumonia Patient was previously treated for pneumonia and recent admission.  Patient started on vancomycin and Zosyn on 9/4 seemingly secondary to worsening airspace disease on x-ray from 9/4.  Blood cultures obtained on 9/4 -MRSA PCR negative so will discontinue vancomycin -Continue Zosyn IV; de-escalate once negative blood cultures -Follow-up blood cultures (9/4) -Procalcitonin in the morning  COPD No obvious evidence of exacerbation.  Patient received Solu-Medrol with EMS. -Continue Dulera, DuoNeb as needed  Acute hypoxic encephalopathy Resolved.  Hyperlipidemia -Continue Lipitor 20 mg daily and ezetimibe 10 mg  daily  Hypokalemia Treated with supplemental potassium.  Depression Anxiety Schizophrenia -Continue bupropion 100 mg daily, clozapine 150 mg daily, hydroxyzine 25 mg 3 times daily as needed, Klonopin  Constipation -Continue Colace and senna   DVT prophylaxis: Lovenox Code Status:   Code Status: Full Code Family Communication: None at bedside Disposition Plan: Discharge to SNF likely in 2 to 3 days pending ability to wean oxygen, transition off of IV antibiotics, blood culture results   Consultants:  None  Procedures:  None  Antimicrobials: Vancomycin IV Zosyn IV   Subjective: Patient reports some coughing which has improved from admission.  Objective: Vitals:   04/15/21 0129 04/15/21 0503 04/15/21 0801 04/15/21 1315  BP: 106/73 131/82 124/83 107/72  Pulse: 87 87 85 86  Resp: (!) 24 (!) 30 (!) 30 (!) 28  Temp: 98.5 F (36.9 C) 98.2 F (36.8 C) 98.2 F (36.8 C) 98.4 F (36.9 C)  TempSrc: Oral Oral Oral Oral  SpO2: 94% 90% 100% 99%  Weight:      Height:        Intake/Output Summary (Last 24 hours) at 04/15/2021 1511 Last data filed at 04/15/2021 1508 Gross per 24 hour  Intake 2983.93 ml  Output 1100 ml  Net 1883.93 ml   Filed Weights   04/12/21 2012  Weight: 80.5 kg    Examination:  General exam: Appears calm and comfortable Respiratory system: Significant left lower lobe rales otherwise clear and diminished. Respiratory effort normal. Cardiovascular system: S1 & S2 heard, RRR. No murmurs, rubs, gallops or clicks. Gastrointestinal system: Abdomen is nondistended, soft and nontender. No organomegaly or masses felt. Normal bowel sounds heard. Central nervous system: Alert. No focal neurological deficits. Musculoskeletal: No edema. No calf tenderness Skin: No cyanosis. No  rashes   Data Reviewed: I have personally reviewed following labs and imaging studies  CBC Lab Results  Component Value Date   WBC 8.1 04/15/2021   RBC 3.86 (L) 04/15/2021   HGB  11.1 (L) 04/15/2021   HCT 35.7 (L) 04/15/2021   MCV 92.5 04/15/2021   MCH 28.8 04/15/2021   PLT 244 04/15/2021   MCHC 31.1 04/15/2021   RDW 13.8 04/15/2021   LYMPHSABS 1.4 04/12/2021   MONOABS 1.0 04/12/2021   EOSABS 0.1 04/12/2021   BASOSABS 0.0 49/20/1007     Last metabolic panel Lab Results  Component Value Date   NA 136 04/15/2021   K 3.9 04/15/2021   CL 100 04/15/2021   CO2 32 04/15/2021   BUN 8 04/15/2021   CREATININE 0.55 (L) 04/15/2021   GLUCOSE 99 04/15/2021   GFRNONAA >60 04/15/2021   GFRAA >60 05/05/2020   CALCIUM 8.2 (L) 04/15/2021   PHOS 3.5 04/15/2021   PROT 5.5 (L) 04/12/2021   ALBUMIN 3.0 (L) 04/12/2021   BILITOT 0.8 04/12/2021   ALKPHOS 55 04/12/2021   AST 17 04/12/2021   ALT 25 04/12/2021   ANIONGAP 4 (L) 04/15/2021    CBG (last 3)  Recent Labs    04/13/21 1143 04/14/21 0959  GLUCAP 116* 114*     GFR: Estimated Creatinine Clearance: 101 mL/min (A) (by C-G formula based on SCr of 0.55 mg/dL (L)).  Coagulation Profile: No results for input(s): INR, PROTIME in the last 168 hours.  Recent Results (from the past 240 hour(s))  Resp Panel by RT-PCR (Flu A&B, Covid)     Status: None   Collection Time: 04/06/21  9:24 AM  Result Value Ref Range Status   SARS Coronavirus 2 by RT PCR NEGATIVE NEGATIVE Final    Comment: (NOTE) SARS-CoV-2 target nucleic acids are NOT DETECTED.  The SARS-CoV-2 RNA is generally detectable in upper respiratory specimens during the acute phase of infection. The lowest concentration of SARS-CoV-2 viral copies this assay can detect is 138 copies/mL. A negative result does not preclude SARS-Cov-2 infection and should not be used as the sole basis for treatment or other patient management decisions. A negative result may occur with  improper specimen collection/handling, submission of specimen other than nasopharyngeal swab, presence of viral mutation(s) within the areas targeted by this assay, and inadequate number of  viral copies(<138 copies/mL). A negative result must be combined with clinical observations, patient history, and epidemiological information. The expected result is Negative.  Fact Sheet for Patients:  EntrepreneurPulse.com.au  Fact Sheet for Healthcare Providers:  IncredibleEmployment.be  This test is no t yet approved or cleared by the [place] FDA and  has been authorized for detection and/or diagnosis of SARS-CoV-2 by FDA under an Emergency Use Authorization (EUA). This EUA will remain  in effect (meaning this test can be used) for the duration of the COVID-19 declaration under Section 564(b)(1) of the Act, 21 U.S.C.section 360bbb-3(b)(1), unless the authorization is terminated  or revoked sooner.       Influenza A by PCR NEGATIVE NEGATIVE Final   Influenza B by PCR NEGATIVE NEGATIVE Final    Comment: (NOTE) The Xpert Xpress SARS-CoV-2/FLU/RSV plus assay is intended as an aid in the diagnosis of influenza from Nasopharyngeal swab specimens and should not be used as a sole basis for treatment. Nasal washings and aspirates are unacceptable for Xpert Xpress SARS-CoV-2/FLU/RSV testing.  Fact Sheet for Patients: EntrepreneurPulse.com.au  Fact Sheet for Healthcare Providers: IncredibleEmployment.be  This test is not yet approved or  cleared by the [place] and has been authorized for detection and/or diagnosis of SARS-CoV-2 by FDA under an Emergency Use Authorization (EUA). This EUA will remain in effect (meaning this test can be used) for the duration of the COVID-19 declaration under Section 564(b)(1) of the Act, 21 U.S.C. section 360bbb-3(b)(1), unless the authorization is terminated or revoked.  Performed at [facility], [address] [place], [place] [address]   Culture, Respiratory w Gram Stain     Status: None   Collection Time: 04/06/21 12:58 PM   Specimen:  Bronchoalveolar Lavage; Respiratory  Result Value Ref Range Status   Specimen Description   Final    BRONCHIAL ALVEOLAR LAVAGE Performed at [facility], [address] [place], [place] [address]    Special Requests   Final    NONE Performed at [facility], [place], [place] [address]    Gram Stain   Final    FEW SQUAMOUS EPITHELIAL CELLS PRESENT RARE WBC SEEN MODERATE GRAM POSITIVE COCCI    Culture   Final    FEW Consistent with normal respiratory flora. NO STAPHYLOCOCCUS AUREUS ISOLATED Performed at [facility], [place] [place], [place] [address]    Report Status 04/09/2021 FINAL  Final  Resp Panel by RT-PCR (Flu A&B, Covid) Nasopharyngeal Swab     Status: None   Collection Time: 04/11/21 11:24 AM   Specimen: Nasopharyngeal Swab; Nasopharyngeal(NP) swabs in vial transport medium  Result Value Ref Range Status   SARS Coronavirus 2 by RT PCR NEGATIVE NEGATIVE Final    Comment: (NOTE) SARS-CoV-2 target nucleic acids are NOT DETECTED.  The SARS-CoV-2 RNA is generally detectable in upper respiratory specimens during the acute phase of infection. The lowest concentration of SARS-CoV-2 viral copies this assay can detect is 138 copies/mL. A negative result does not preclude SARS-Cov-2 infection and should not be used as the sole basis for treatment or other patient management decisions. A negative result may occur with  improper specimen collection/handling, submission of specimen other than nasopharyngeal swab, presence of viral mutation(s) within the areas targeted by this assay, and inadequate number of viral copies(<138 copies/mL). A negative result must be combined with clinical observations, patient history, and epidemiological information. The expected result is Negative.  Fact Sheet for Patients:  EntrepreneurPulse.com.au  Fact Sheet for Healthcare Providers:   IncredibleEmployment.be  This test is no t yet approved or cleared by the [place] FDA and  has been authorized for detection and/or diagnosis of SARS-CoV-2 by FDA under an Emergency Use Authorization (EUA). This EUA will remain  in effect (meaning this test can be used) for the duration of the COVID-19 declaration under Section 564(b)(1) of the Act, 21 U.S.C.section 360bbb-3(b)(1), unless the authorization is terminated  or revoked sooner.       Influenza A by PCR NEGATIVE NEGATIVE Final   Influenza B by PCR NEGATIVE NEGATIVE Final    Comment: (NOTE) The Xpert Xpress SARS-CoV-2/FLU/RSV plus assay is intended as an aid in the diagnosis of influenza from Nasopharyngeal swab specimens and should not be used as a sole basis for treatment. Nasal washings and aspirates are unacceptable for Xpert Xpress SARS-CoV-2/FLU/RSV testing.  Fact Sheet for Patients: EntrepreneurPulse.com.au  Fact Sheet for Healthcare Providers: IncredibleEmployment.be  This test is not yet approved or cleared by the [place] FDA and has been authorized for detection and/or diagnosis of SARS-CoV-2 by FDA under an Emergency Use Authorization (EUA). This EUA will remain in effect (  meaning this test can be used) for the duration of the COVID-19 declaration under Section 564(b)(1) of the Act, 21 U.S.C. section 360bbb-3(b)(1), unless the authorization is terminated or revoked.  Performed at [facility], [name] [place], [place] [address]   Resp Panel by RT-PCR (Flu A&B, Covid) Nasopharyngeal Swab     Status: None   Collection Time: 04/12/21  8:32 PM   Specimen: Nasopharyngeal Swab; Nasopharyngeal(NP) swabs in vial transport medium  Result Value Ref Range Status   SARS Coronavirus 2 by RT PCR NEGATIVE NEGATIVE Final    Comment: (NOTE) SARS-CoV-2 target nucleic acids are NOT DETECTED.  The SARS-CoV-2 RNA is generally detectable  in upper respiratory specimens during the acute phase of infection. The lowest concentration of SARS-CoV-2 viral copies this assay can detect is 138 copies/mL. A negative result does not preclude SARS-Cov-2 infection and should not be used as the sole basis for treatment or other patient management decisions. A negative result may occur with  improper specimen collection/handling, submission of specimen other than nasopharyngeal swab, presence of viral mutation(s) within the areas targeted by this assay, and inadequate number of viral copies(<138 copies/mL). A negative result must be combined with clinical observations, patient history, and epidemiological information. The expected result is Negative.  Fact Sheet for Patients:  EntrepreneurPulse.com.au  Fact Sheet for Healthcare Providers:  IncredibleEmployment.be  This test is no t yet approved or cleared by the [place] FDA and  has been authorized for detection and/or diagnosis of SARS-CoV-2 by FDA under an Emergency Use Authorization (EUA). This EUA will remain  in effect (meaning this test can be used) for the duration of the COVID-19 declaration under Section 564(b)(1) of the Act, 21 U.S.C.section 360bbb-3(b)(1), unless the authorization is terminated  or revoked sooner.       Influenza A by PCR NEGATIVE NEGATIVE Final   Influenza B by PCR NEGATIVE NEGATIVE Final    Comment: (NOTE) The Xpert Xpress SARS-CoV-2/FLU/RSV plus assay is intended as an aid in the diagnosis of influenza from Nasopharyngeal swab specimens and should not be used as a sole basis for treatment. Nasal washings and aspirates are unacceptable for Xpert Xpress SARS-CoV-2/FLU/RSV testing.  Fact Sheet for Patients: EntrepreneurPulse.com.au  Fact Sheet for Healthcare Providers: IncredibleEmployment.be  This test is not yet approved or cleared by the [place] FDA and has  been authorized for detection and/or diagnosis of SARS-CoV-2 by FDA under an Emergency Use Authorization (EUA). This EUA will remain in effect (meaning this test can be used) for the duration of the COVID-19 declaration under Section 564(b)(1) of the Act, 21 U.S.C. section 360bbb-3(b)(1), unless the authorization is terminated or revoked.  Performed at [facility], [address] [place], [place] [address]   CULTURE, BLOOD (ROUTINE X 2) w Reflex to ID Panel     Status: None (Preliminary result)   Collection Time: 04/14/21  6:43 PM   Specimen: BLOOD  Result Value Ref Range Status   Specimen Description BLOOD BLOOD LEFT HAND  Final   Special Requests   Final    BOTTLES DRAWN AEROBIC AND ANAEROBIC Blood Culture adequate volume   Culture   Final    NO GROWTH < 24 HOURS Performed at [facility], [address] [place], [place] [address]    Report Status PENDING  Incomplete  CULTURE, BLOOD (ROUTINE X 2) w Reflex to ID Panel     Status: None (Preliminary result)   Collection Time: 04/14/21  6:43 PM   Specimen:  BLOOD  Result Value Ref Range Status   Specimen Description BLOOD BLOOD RIGHT HAND  Final   Special Requests   Final    BOTTLES DRAWN AEROBIC AND ANAEROBIC Blood Culture adequate volume   Culture   Final    NO GROWTH < 24 HOURS Performed at [facility], [address] [place], [place] [address]    Report Status PENDING  Incomplete  MRSA Next Gen by PCR, Nasal     Status: None   Collection Time: 04/14/21  6:47 PM   Specimen: Nasal Mucosa; Nasal Swab  Result Value Ref Range Status   MRSA by PCR Next Gen NOT DETECTED NOT DETECTED Final    Comment: (NOTE) The GeneXpert MRSA Assay (FDA approved for NASAL specimens only), is one component of a comprehensive MRSA colonization surveillance program. It is not intended to diagnose MRSA infection nor to guide or monitor treatment for MRSA infections. Test performance is not FDA approved in patients  less than 39 years old. Performed at [facility], [address], [place], [place] [address]         Radiology Studies: [facility] Chest [place] 1 View  Result Date: 04/14/2021 CLINICAL DATA:  Respiratory distress. EXAM: PORTABLE CHEST 1 VIEW COMPARISON:  Chest radiograph dated 04/12/2021. FINDINGS: The heart is borderline enlarged. Vascular calcifications are seen in the aortic arch. Bibasilar atelectasis/airspace disease is slightly increased from prior exam. There is no pleural effusion or pneumothorax. Degenerative changes are seen in the spine. IMPRESSION: Bibasilar atelectasis/airspace disease is slightly increased. Aortic Atherosclerosis (ICD10-I70.0). Electronically Signed   By: Zerita Boers M.D.   On: 04/14/2021 18:41        Scheduled Meds:  atorvastatin  20 mg Oral QHS   buPROPion ER  100 mg Oral Daily   cholecalciferol  1,000 Units Oral Daily   citric acid-potassium citrate  40 mEq Oral Once   cloZAPine  150 mg Oral QHS   docusate sodium  100 mg Oral BID   enoxaparin (LOVENOX) injection  40 mg Subcutaneous Q24H   ezetimibe  10 mg Oral QHS   guaiFENesin  600 mg Oral BID   hydrOXYzine  25 mg Oral TID   mometasone-formoterol  2 puff Inhalation BID   pantoprazole  40 mg Oral BID   polyethylene glycol  17 g Oral Daily   sodium chloride  1 g Oral TID WC   tamsulosin  0.4 mg Oral Daily   Continuous Infusions:  sodium chloride 100 mL/hr at 04/15/21 0746   piperacillin-tazobactam (ZOSYN)  IV 3.375 g (04/15/21 1312)   vancomycin 1,250 mg (04/15/21 0747)     LOS: 1 day     Cordelia Poche, MD [facility] 04/15/2021, 3:11 PM  If 7PM-7AM, please contact night-coverage [email or web address]

## 2021-04-15 NOTE — Evaluation (Signed)
Occupational Therapy Evaluation Patient Details Name: Brandon [name] MRN: [id number] DOB: February 01, 1956 Today's Date: 04/15/2021    History of Present Illness Brandon [name] is a 65 y.o. male with medical history significant for hypertension, CAD, COPD, hyperlipidemia, history of schizoaffective disorder bipolar 1, generalized weakness, debility, query early dementia, presents emergency department for chief concerns of shortness of breath and unresponsiveness.   Clinical Impression   Pt seen for OT evaluation this date. Prior to admission, pt was receiving therapy at SNF following recent admission 2 weeks ago. Pt reports his OT goals at SNF included showering, shaving, and progressive functional mobility. At baseline, pt is independent with ADLs, uses SPC/WC for functional mobility, and receives some assist from family for IADLs.   Upon arrival to room, pt seated upright in bed on 3L of O2. Pt agreeable to OT eval/tx, however session limited by pt's symptomatic orthostatic hypotension (see flowsheet). Due to current functional impairments (See OT Problem List below), pt currently requires SUPERVISION for bed mobility, SUPERVISION/SET-UP for seated ADLs at EOB, and MIN A for stand pivot transfers to/from recliner with RW. Following transfer to recliner, pt c/o of dizziness and "not thinking clearly" (BP 92/66 with legs elevated and head lowered). Pt assisted back to bed, with all needs within reach, and left in no acute distress (BP 106/70, HR 88); RN informed. Pt would benefit from additional skilled OT services to maximize return to PLOF and minimize risk of future falls, injury, caregiver burden, and readmission. Upon discharge, recommend SNF.      Follow Up Recommendations  SNF    Equipment Recommendations  Other (comment) (defer to next venue of care)       Precautions / Restrictions Precautions Precautions: Fall Restrictions Weight Bearing Restrictions: No      Mobility Bed  Mobility Overal bed mobility: Needs Assistance Bed Mobility: Supine to Sit;Sit to Supine     Supine to sit: Supervision;HOB elevated Sit to supine: Supervision   General bed mobility comments: use of bed rails    Transfers Overall transfer level: Needs assistance Equipment used: Rolling walker (2 wheeled) Transfers: Sit to/from [employer] Sit to Stand: Min assist Stand pivot transfers: Min assist       General transfer comment: Verbal cues for safe hand placement and for aligning hips with chair during stand pivot transfer    Balance Overall balance assessment: Needs assistance Sitting-balance support: Bilateral upper extremity supported;Feet supported Sitting balance-[name] Scale: Good Sitting balance - Comments: SUPERVISION for dynamic balance at EOB d/t dizziness   Standing balance support: Bilateral upper extremity supported;During functional activity Standing balance-[name] Scale: Poor Standing balance comment: Requires MIN A + b/l UE from RW during stand pivot transfer                           ADL either performed or assessed with clinical judgement   ADL Overall ADL's : Needs assistance/impaired                                       General ADL Comments: SUPERVISION/SET-UP for seated UB ADLs. SUPERVISION for seated LB dressing.      Pertinent Vitals/Pain Pain Assessment: No/denies pain        Extremity/Trunk Assessment Upper Extremity Assessment Upper Extremity Assessment: Overall WFL for tasks assessed   Lower Extremity Assessment Lower Extremity Assessment: Generalized weakness  Communication Communication Communication: No difficulties   Cognition Arousal/Alertness: Awake/alert Behavior During Therapy: WFL for tasks assessed/performed Overall Cognitive Status: Within Functional Limits for tasks assessed                                 General Comments: A&Ox4. Pleasant and agreeable  throughout              Home Living Family/patient expects to be discharged to:: Skilled nursing facility Living Arrangements: Alone Available Help at Discharge: Family;Friend(s);Available PRN/intermittently Type of Home: Apartment Home Access: Stairs to enter Entrance Stairs-Number of Steps: 5-6 Entrance Stairs-Rails: Left Home Layout: One level               Home Equipment: Walker - 2 wheels;Cane - single point   Additional Comments: previously lived alone, however has been in SNF for past 2 weeks      Prior Functioning/Environment Level of Independence: Needs assistance  Gait / Transfers Assistance Needed: Ambulatory with SPC in home, RW in community. Has been [facility] RW at [facility] ADL's / Homemaking Assistance Needed: Independent with ADLs and cooking. Family assists with other IADLs            OT Problem List: Decreased strength;Decreased activity tolerance;Impaired balance (sitting and/or standing);Cardiopulmonary status limiting activity      OT Treatment/Interventions: Self-care/ADL training;Therapeutic exercise;Energy conservation;DME and/or AE instruction;Therapeutic activities;Patient/family education;Balance training    OT Goals(Current goals can be found in the care plan section) Acute Rehab OT Goals Patient Stated Goal: to go back home safely OT Goal Formulation: With patient Time For Goal Achievement: 04/29/21 Potential to Achieve Goals: Good ADL Goals Pt Will Perform Grooming: with set-up;sitting Pt Will Perform Lower Body Dressing: with min guard assist;sit to/from stand Pt Will Transfer to Toilet: with min guard assist;ambulating;bedside commode  OT Frequency: Min 1X/week    AM-PAC OT "6 Clicks" Daily Activity     Outcome Measure Help from another person eating meals?: None Help from another person taking care of personal grooming?: A Little Help from another person toileting, which includes using toliet, bedpan, or urinal?: A Lot Help from  another person bathing (including washing, rinsing, drying)?: A Lot Help from another person to put on and taking off regular upper body clothing?: None Help from another person to put on and taking off regular lower body clothing?: A Little 6 Click Score: 18   End of Session Equipment Utilized During Treatment: Gait belt;Rolling walker Nurse Communication: Mobility status;Other (comment) (orthostatic vitals)  Activity Tolerance: Treatment limited secondary to medical complications (Comment);Other (comment) (treated limited by orthostatic hypotension) Patient left: in bed;with call bell/phone within reach;with bed alarm set  OT Visit Diagnosis: Unsteadiness on feet (R26.81)                Time: 7588-3254 OT Time Calculation (min): 44 min Charges:  OT General Charges $OT Visit: 1 Visit OT Evaluation $OT Eval Moderate Complexity: 1 Mod OT Treatments $Self Care/Home Management : 8-22 mins $Therapeutic Activity: 8-22 mins  Matthew Folks, OTR/L [facility] [phone]

## 2021-04-15 NOTE — Plan of Care (Signed)
  Problem: Education: Goal: Knowledge of General Education information will improve Description: Including pain rating scale, medication(s)/side effects and non-pharmacologic comfort measures Outcome: Progressing   Problem: Clinical Measurements: Goal: Ability to maintain clinical measurements within normal limits will improve Outcome: Progressing   Problem: Clinical Measurements: Goal: Respiratory complications will improve Outcome: Progressing   

## 2021-04-16 LAB — BASIC METABOLIC PANEL
Anion gap: 6 (ref 5–15)
BUN: 6 mg/dL — ABNORMAL LOW (ref 8–23)
CO2: 33 mmol/L — ABNORMAL HIGH (ref 22–32)
Calcium: 8.5 mg/dL — ABNORMAL LOW (ref 8.9–10.3)
Chloride: 97 mmol/L — ABNORMAL LOW (ref 98–111)
Creatinine, Ser: 0.54 mg/dL — ABNORMAL LOW (ref 0.61–1.24)
GFR, Estimated: >60 mL/min (ref >60)
Glucose, Bld: 97 mg/dL (ref 70–99)
Potassium: 3.7 mmol/L (ref 3.5–5.1)
Sodium: 136 mmol/L (ref 135–145)

## 2021-04-16 LAB — CBC
HCT: 36.1 % — ABNORMAL LOW (ref 39.0–52.0)
Hemoglobin: 11.3 g/dL — ABNORMAL LOW (ref 13.0–17.0)
MCH: 28.1 pg (ref 26.0–34.0)
MCHC: 31.3 g/dL (ref 30.0–36.0)
MCV: 89.8 fL (ref 80.0–100.0)
Platelets: 271 10*3/uL (ref 150–400)
RBC: 4.02 MIL/uL — ABNORMAL LOW (ref 4.22–5.81)
RDW: 13.7 % (ref 11.5–15.5)
WBC: 8 10*3/uL (ref 4.0–10.5)
nRBC: 0 % (ref 0.0–0.2)

## 2021-04-16 LAB — PROCALCITONIN: Procalcitonin: <0.1 ng/mL

## 2021-04-16 MED ORDER — CEFDINIR 300 MG PO CAPS
300.0000 mg | ORAL_CAPSULE | Freq: Two times a day (BID) | ORAL | Status: DC
  Administered 2021-04-16 – 2021-04-17 (×3): 300 mg via ORAL
  Filled 2021-04-16 (×4): qty 1

## 2021-04-16 NOTE — Progress Notes (Signed)
Mobility Specialist - Progress Note   04/16/21 1600  Orthostatic Sitting  BP- Sitting 116/73  Orthostatic Standing at 0 minutes  BP- Standing at 0 minutes 95/63  Mobility  Activity Transferred:  Chair to bed  Level of Assistance Standby assist, set-up cues, supervision of patient - no hands on  Assistive Device Front wheel walker  Distance Ambulated (ft) 4 ft  Mobility Response Tolerated well  Mobility performed by Mobility specialist  $Mobility charge 1 Mobility    Pre-mobility: 80 HR, 98% SpO2   Pt received in recliner, utilizing 2L. Pt requested mobility to return for ambulation, however unable to progress to ambulation this date d/t pt becoming fatigue with standing. Pt returned to bed with needs in reach, alarm set. Mild dizziness.    Filiberto Pinks Mobility Specialist 04/16/21, 4:52 PM

## 2021-04-16 NOTE — Progress Notes (Signed)
[facility]  - [place] [facility]   PATIENT NAME: Brandon [name]    MR#:  [id number]  DATE OF BIRTH:  31-Dec-1955  SUBJECTIVE:  patient watching TV. Complains of constipation. Eating well. No respiratory distress. Able to complete sentence without any shortness of breath. Sats more than 92% on 1 L nasal cannula.  REVIEW OF SYSTEMS:   Review of Systems  Constitutional:  Negative for chills, fever and weight loss.  HENT:  Negative for ear discharge, ear pain and nosebleeds.   Eyes:  Negative for blurred vision, pain and discharge.  Respiratory:  Negative for sputum production, shortness of breath, wheezing and stridor.   Cardiovascular:  Negative for chest pain, palpitations, orthopnea and PND.  Gastrointestinal:  Positive for constipation. Negative for abdominal pain, diarrhea, nausea and vomiting.  Genitourinary:  Negative for frequency and urgency.  Musculoskeletal:  Negative for back pain and joint pain.  Neurological:  Negative for sensory change, speech change, focal weakness and weakness.  Psychiatric/Behavioral:  Negative for depression and hallucinations. The patient is not nervous/anxious.   Tolerating Diet:yes Tolerating PT: SNF  DRUG ALLERGIES:   Allergies  Allergen Reactions  . Compazine [Prochlorperazine] Other (See Comments)    Dystonic rxn - convulsions. Near fatal reaction.   Ardine Bjork [Iodinated Diagnostic Agents] Shortness Of Breath    Pt states SOB after last IV contrast injection  . Alcohol-Sulfur [Elemental Sulfur] Other (See Comments)    History of alcoholism  . Benadryl [Diphenhydramine] Other (See Comments)    "stuffy", nasal congestion  . Depakote [Divalproex Sodium] Other (See Comments)    Cause elevated ammonia  . Dramamine [Dimenhydrinate] Swelling  . Plavix [Clopidogrel] Other (See Comments)    Rectal bleeding. "Perforated my intestines."  . Rosuvastatin     Other reaction(s): Other (See Comments)    VITALS:  Blood pressure  113/72, pulse 81, temperature 98.1 F (36.7 C), temperature source Oral, resp. rate (!) 32, height 6' (1.829 m), weight 80.5 kg, SpO2 94 %.  PHYSICAL EXAMINATION:   Physical Exam  GENERAL:  65 y.o.-year-old patient lying in the bed with no acute distress.  LUNGS: Normal breath sounds bilaterally, no wheezing, rales, rhonchi. No use of accessory muscles of respiration.  CARDIOVASCULAR: S1, S2 normal. No murmurs, rubs, or gallops.  ABDOMEN: Soft, nontender, nondistended. Bowel sounds present. No organomegaly or mass.  EXTREMITIES: No cyanosis, clubbing or edema b/l.    NEUROLOGIC: Cranial nerves II through XII are intact. No focal Motor or sensory deficits b/l.   PSYCHIATRIC:  patient is alert and oriented x 3.  SKIN: No obvious rash, lesion, or ulcer.   LABORATORY PANEL:  CBC Recent Labs  Lab 04/16/21 0526  WBC 8.0  HGB 11.3*  HCT 36.1*  PLT 271    Chemistries  Recent Labs  Lab 04/12/21 2018 04/12/21 2321 04/15/21 0543 04/16/21 0526  NA 132*   < > 136 136  K 3.2*   < > 3.9 3.7  CL 93*   < > 100 97*  CO2 31   < > 32 33*  GLUCOSE 116*   < > 99 97  BUN 16   < > 8 6*  CREATININE 0.98   < > 0.55* 0.54*  CALCIUM 8.5*   < > 8.2* 8.5*  MG  --    < > 1.8  --   AST 17  --   --   --   ALT 25  --   --   --  ALKPHOS 55  --   --   --   BILITOT 0.8  --   --   --    < > = values in this interval not displayed.   Cardiac Enzymes No results for input(s): TROPONINI in the last 168 hours. RADIOLOGY:  DG Chest Port 1 View  Result Date: 04/15/2021 CLINICAL DATA:  Shortness of breath EXAM: PORTABLE CHEST 1 VIEW COMPARISON:  04/14/2021, CT 04/05/2021 FINDINGS: Low lung volumes. Atelectasis or scarring in the left lower lung. Improved aeration of left lung base. Lucency beneath the right diaphragm likely due to colon interposition. Stable cardiomediastinal silhouette with aortic atherosclerosis. IMPRESSION: Low lung volumes with slightly improved aeration left base. There is residual  atelectasis at left base Electronically Signed   By: Jasmine Pang M.D.   On: 04/15/2021 18:36   DG Chest Port 1 View  Result Date: 04/14/2021 CLINICAL DATA:  Respiratory distress. EXAM: PORTABLE CHEST 1 VIEW COMPARISON:  Chest radiograph dated 04/12/2021. FINDINGS: The heart is borderline enlarged. Vascular calcifications are seen in the aortic arch. Bibasilar atelectasis/airspace disease is slightly increased from prior exam. There is no pleural effusion or pneumothorax. Degenerative changes are seen in the spine. IMPRESSION: Bibasilar atelectasis/airspace disease is slightly increased. Aortic Atherosclerosis (ICD10-I70.0). Electronically Signed   By: Romona Curls M.D.   On: 04/14/2021 18:41   ASSESSMENT AND PLAN:  Brandon [name] is a 65 y.o. male with history of CAD, COPD, hyperlipidemia, schizoaffective disorder, bipolar 1 disorder, generalized weakness, debility.  Patient presented secondary to shortness of breath and unresponsiveness found to have acute respite failure with hypoxia and hypercapnia.  Patient was treated with oxygen in addition to receiving DuoNeb treatment for COPD exacerbation.  Acute respiratory failure with hypoxia and hypercapnia --In setting of recent pneumonia.   --ABG significant for mild hypercapnia with normal pH.  Patient initially required nonrebreather and then weaned down to oxygen via nasal cannula.   --Chest x-ray this admission shows improved infiltrates.  -- PT recommending SNF on discharge -Wean oxygen to room air as able   pneumonia Patient was previously treated for pneumonia and recent admission.  Patient started on vancomycin and Zosyn on 9/4 seemingly secondary to worsening airspace disease on x-ray from 9/4.  Blood cultures obtained on 9/4 -MRSA PCR negative so will discontinue vancomycin -9/6--pt on Zosyn IV; de-escalate abxs to oral since negative blood cultures -Procalcitonin <0.10   COPD No obvious evidence of exacerbation.  Patient received  Solu-Medrol with EMS. -Continue Dulera, DuoNeb as needed   Acute hypoxic encephalopathy Resolved. Mentation back to baseline   Hyperlipidemia -Continue Lipitor and ezetimibe   Hypokalemia Treated with supplemental potassium.   Depression Anxiety Schizophrenia -Continue bupropion , clozapine , hydroxyzine 25 mg 3 times daily as needed, Klonopin   Constipation -Continue Colace and senna   patient overall improving. Will continue to monitor for one more day.    DVT prophylaxis: Lovenox Code Status:   Code Status: Full Code Family Communication: None at bedside Disposition Plan: Discharge to SNF likely in the morning.      TOTAL TIME TAKING CARE OF THIS PATIENT: 25 minutes.  >50% time spent on counselling and coordination of care  Note: This dictation was prepared with Dragon dictation along with smaller phrase technology. Any transcriptional errors that result from this process are unintentional.  Enedina Finner M.D    [facility]   CC: Primary care physician; Sherron Monday, MD Patient ID: Brandon [name], male   DOB: 1956/04/19, 65 y.o.  MRN: [id number]

## 2021-04-16 NOTE — TOC Progression Note (Signed)
Transition of Care [facility]) - Progression Note    Patient Details  Name: Brandon [name] MRN: [id number] Date of Birth: 1956/06/22  [facility]) CM/SW Contact  Chapman Fitch, RN Phone Number: 04/16/2021, 10:20 AM  Clinical Narrative:     Assessed by [name] 8/14 see note below  "CSW spoke to patient regarding discharge planning. Patient lives alone. Uses [facility] for appointments. PCP is Dr. Ellsworth Lennox. Pharmacy is [facility]. Patient has a RW, rollator, and cane. Patient had [facility] recently. Patient went to [facility] in April. Confirmed home address in chart. Patient has had 2 COVID vaccines plus 1 booster, said he would like the 2nd booster if needed. Patient is agreeable to SNF recommendation and prefers [facility]"   Patient confirms his wishes are to return to [facility] at discharge.  Message sent to [facility] at [facility] to determine if patient can return pending auth       Expected Discharge Plan and Services           Expected Discharge Date: 04/15/21                                     Social Determinants of Health (SDOH) Interventions    Readmission Risk Interventions Readmission Risk Prevention Plan 04/09/2021 03/24/2021  Transportation Screening Complete Complete  PCP or Specialist Appt within 5-7 Days - Complete  Home Care Screening - Complete  Medication Review (RN CM) - Complete  Medication Review (RN Care Manager) Complete -  PCP or Specialist appointment within 3-5 days of discharge Complete -  HRI or Home Care Consult Complete -  SW Recovery Care/Counseling Consult Complete -  Palliative Care Screening Not Applicable -  Skilled Nursing Facility Complete -  Some recent data might be hidden

## 2021-04-16 NOTE — TOC Progression Note (Addendum)
Transition of Care [facility]) - Progression Note    Patient Details  Name: Brandon [name] MRN: [id number] Date of Birth: 1956/02/26  Transition of Care [facility]) CM/SW Contact  Chapman Fitch, RN Phone Number: 04/16/2021, 1:06 PM  Clinical Narrative:      [facility] Harvey started in navi portal for anticipated DC tomorrow   Inetta Fermo at [facility] confirms patient can return pending auth    Expected Discharge Plan and Services           Expected Discharge Date: 04/15/21                                     Social Determinants of Health (SDOH) Interventions    Readmission Risk Interventions Readmission Risk Prevention Plan 04/09/2021 03/24/2021  Transportation Screening Complete Complete  PCP or Specialist Appt within 5-7 Days - Complete  Home Care Screening - Complete  Medication Review (RN CM) - Complete  Medication Review (RN Care Manager) Complete -  PCP or Specialist appointment within 3-5 days of discharge Complete -  HRI or Home Care Consult Complete -  SW Recovery Care/Counseling Consult Complete -  Palliative Care Screening Not Applicable -  Skilled Nursing Facility Complete -  Some recent data might be hidden

## 2021-04-16 NOTE — Progress Notes (Addendum)
Mobility Specialist - Progress Note   04/16/21 1400  Orthostatic Lying   BP- Lying 106/69  Orthostatic Sitting  BP- Sitting 111/74  Pulse- Sitting 98  Orthostatic Standing at 0 minutes  BP- Standing at 0 minutes (!) 89/63  Mobility  Activity Transferred:  Bed to chair  Level of Assistance Standby assist, set-up cues, supervision of patient - no hands on  Assistive Device Front wheel walker  Distance Ambulated (ft) 4 ft  Mobility Out of bed to chair with meals  Mobility Response Tolerated well  Mobility performed by Mobility specialist  $Mobility charge 1 Mobility    During mobility: 98 HR, 96% SpO2   Pt lying in bed upon arrival utilizing 2L. Does voice nausea and fatigue, but agreeable to OOB activity. Orthostatic VS recorded above. Dizzy throughout session. Ambulated to recliner with RW and supervision. Pt left in recliner with needs in reach. BP increased to 107/70 once seated. Motivated to attempt ambulation later this date, will attempt as time permits. Pt also requesting compression socks. RN notified.   Filiberto Pinks Mobility Specialist 04/16/21, 2:18 PM

## 2021-04-17 DIAGNOSIS — N400 Enlarged prostate without lower urinary tract symptoms: Secondary | ICD-10-CM | POA: Diagnosis present

## 2021-04-17 DIAGNOSIS — Z66 Do not resuscitate: Secondary | ICD-10-CM | POA: Diagnosis not present

## 2021-04-17 DIAGNOSIS — R262 Difficulty in walking, not elsewhere classified: Secondary | ICD-10-CM | POA: Diagnosis not present

## 2021-04-17 DIAGNOSIS — F2089 Other schizophrenia: Secondary | ICD-10-CM | POA: Diagnosis not present

## 2021-04-17 DIAGNOSIS — R4182 Altered mental status, unspecified: Secondary | ICD-10-CM | POA: Diagnosis not present

## 2021-04-17 DIAGNOSIS — K5900 Constipation, unspecified: Secondary | ICD-10-CM | POA: Diagnosis not present

## 2021-04-17 DIAGNOSIS — F10239 Alcohol dependence with withdrawal, unspecified: Secondary | ICD-10-CM | POA: Diagnosis present

## 2021-04-17 DIAGNOSIS — I10 Essential (primary) hypertension: Secondary | ICD-10-CM | POA: Diagnosis not present

## 2021-04-17 DIAGNOSIS — Z743 Need for continuous supervision: Secondary | ICD-10-CM | POA: Diagnosis not present

## 2021-04-17 DIAGNOSIS — R000 Tachycardia, unspecified: Secondary | ICD-10-CM | POA: Diagnosis not present

## 2021-04-17 DIAGNOSIS — E860 Dehydration: Secondary | ICD-10-CM | POA: Diagnosis not present

## 2021-04-17 DIAGNOSIS — I252 Old myocardial infarction: Secondary | ICD-10-CM | POA: Diagnosis not present

## 2021-04-17 DIAGNOSIS — J449 Chronic obstructive pulmonary disease, unspecified: Secondary | ICD-10-CM | POA: Diagnosis not present

## 2021-04-17 DIAGNOSIS — W19XXXA Unspecified fall, initial encounter: Secondary | ICD-10-CM | POA: Diagnosis not present

## 2021-04-17 DIAGNOSIS — R29898 Other symptoms and signs involving the musculoskeletal system: Secondary | ICD-10-CM | POA: Diagnosis not present

## 2021-04-17 DIAGNOSIS — R4701 Aphasia: Secondary | ICD-10-CM | POA: Diagnosis not present

## 2021-04-17 DIAGNOSIS — I639 Cerebral infarction, unspecified: Secondary | ICD-10-CM | POA: Diagnosis not present

## 2021-04-17 DIAGNOSIS — R4189 Other symptoms and signs involving cognitive functions and awareness: Secondary | ICD-10-CM | POA: Diagnosis not present

## 2021-04-17 DIAGNOSIS — F250 Schizoaffective disorder, bipolar type: Secondary | ICD-10-CM | POA: Diagnosis present

## 2021-04-17 DIAGNOSIS — J9622 Acute and chronic respiratory failure with hypercapnia: Secondary | ICD-10-CM | POA: Diagnosis not present

## 2021-04-17 DIAGNOSIS — J441 Chronic obstructive pulmonary disease with (acute) exacerbation: Secondary | ICD-10-CM | POA: Diagnosis not present

## 2021-04-17 DIAGNOSIS — E222 Syndrome of inappropriate secretion of antidiuretic hormone: Secondary | ICD-10-CM | POA: Diagnosis not present

## 2021-04-17 DIAGNOSIS — M722 Plantar fascial fibromatosis: Secondary | ICD-10-CM | POA: Diagnosis not present

## 2021-04-17 DIAGNOSIS — R498 Other voice and resonance disorders: Secondary | ICD-10-CM | POA: Diagnosis not present

## 2021-04-17 DIAGNOSIS — I2510 Atherosclerotic heart disease of native coronary artery without angina pectoris: Secondary | ICD-10-CM | POA: Diagnosis not present

## 2021-04-17 DIAGNOSIS — Z4682 Encounter for fitting and adjustment of non-vascular catheter: Secondary | ICD-10-CM | POA: Diagnosis not present

## 2021-04-17 DIAGNOSIS — Z515 Encounter for palliative care: Secondary | ICD-10-CM | POA: Diagnosis not present

## 2021-04-17 DIAGNOSIS — E876 Hypokalemia: Secondary | ICD-10-CM | POA: Diagnosis not present

## 2021-04-17 DIAGNOSIS — R0902 Hypoxemia: Secondary | ICD-10-CM | POA: Diagnosis not present

## 2021-04-17 DIAGNOSIS — R471 Dysarthria and anarthria: Secondary | ICD-10-CM | POA: Diagnosis not present

## 2021-04-17 DIAGNOSIS — R6889 Other general symptoms and signs: Secondary | ICD-10-CM | POA: Diagnosis not present

## 2021-04-17 DIAGNOSIS — J383 Other diseases of vocal cords: Secondary | ICD-10-CM | POA: Diagnosis not present

## 2021-04-17 DIAGNOSIS — R404 Transient alteration of awareness: Secondary | ICD-10-CM | POA: Diagnosis not present

## 2021-04-17 DIAGNOSIS — Z20822 Contact with and (suspected) exposure to covid-19: Secondary | ICD-10-CM | POA: Diagnosis not present

## 2021-04-17 DIAGNOSIS — E785 Hyperlipidemia, unspecified: Secondary | ICD-10-CM | POA: Diagnosis not present

## 2021-04-17 DIAGNOSIS — R4020 Unspecified coma: Secondary | ICD-10-CM | POA: Diagnosis not present

## 2021-04-17 DIAGNOSIS — R9431 Abnormal electrocardiogram [ECG] [EKG]: Secondary | ICD-10-CM | POA: Diagnosis not present

## 2021-04-17 DIAGNOSIS — I951 Orthostatic hypotension: Secondary | ICD-10-CM | POA: Diagnosis not present

## 2021-04-17 DIAGNOSIS — R5381 Other malaise: Secondary | ICD-10-CM | POA: Diagnosis not present

## 2021-04-17 DIAGNOSIS — G4700 Insomnia, unspecified: Secondary | ICD-10-CM | POA: Diagnosis not present

## 2021-04-17 DIAGNOSIS — G9341 Metabolic encephalopathy: Secondary | ICD-10-CM | POA: Diagnosis not present

## 2021-04-17 DIAGNOSIS — J9811 Atelectasis: Secondary | ICD-10-CM | POA: Diagnosis not present

## 2021-04-17 DIAGNOSIS — I700 Atherosclerosis of aorta: Secondary | ICD-10-CM | POA: Diagnosis not present

## 2021-04-17 DIAGNOSIS — E871 Hypo-osmolality and hyponatremia: Secondary | ICD-10-CM | POA: Diagnosis not present

## 2021-04-17 DIAGNOSIS — M6281 Muscle weakness (generalized): Secondary | ICD-10-CM | POA: Diagnosis not present

## 2021-04-17 DIAGNOSIS — J9621 Acute and chronic respiratory failure with hypoxia: Secondary | ICD-10-CM | POA: Diagnosis not present

## 2021-04-17 DIAGNOSIS — J9601 Acute respiratory failure with hypoxia: Secondary | ICD-10-CM | POA: Diagnosis not present

## 2021-04-17 DIAGNOSIS — R0602 Shortness of breath: Secondary | ICD-10-CM | POA: Diagnosis not present

## 2021-04-17 DIAGNOSIS — R531 Weakness: Secondary | ICD-10-CM | POA: Diagnosis not present

## 2021-04-17 DIAGNOSIS — Z7189 Other specified counseling: Secondary | ICD-10-CM | POA: Diagnosis not present

## 2021-04-17 DIAGNOSIS — R479 Unspecified speech disturbances: Secondary | ICD-10-CM | POA: Diagnosis not present

## 2021-04-17 DIAGNOSIS — F32A Depression, unspecified: Secondary | ICD-10-CM | POA: Diagnosis not present

## 2021-04-17 DIAGNOSIS — J690 Pneumonitis due to inhalation of food and vomit: Secondary | ICD-10-CM | POA: Diagnosis not present

## 2021-04-17 DIAGNOSIS — J9602 Acute respiratory failure with hypercapnia: Secondary | ICD-10-CM | POA: Diagnosis not present

## 2021-04-17 DIAGNOSIS — J189 Pneumonia, unspecified organism: Secondary | ICD-10-CM | POA: Diagnosis not present

## 2021-04-17 DIAGNOSIS — H4010X0 Unspecified open-angle glaucoma, stage unspecified: Secondary | ICD-10-CM | POA: Diagnosis not present

## 2021-04-17 DIAGNOSIS — K2100 Gastro-esophageal reflux disease with esophagitis, without bleeding: Secondary | ICD-10-CM | POA: Diagnosis not present

## 2021-04-17 DIAGNOSIS — Z7401 Bed confinement status: Secondary | ICD-10-CM | POA: Diagnosis not present

## 2021-04-17 DIAGNOSIS — J9600 Acute respiratory failure, unspecified whether with hypoxia or hypercapnia: Secondary | ICD-10-CM | POA: Diagnosis not present

## 2021-04-17 DIAGNOSIS — M47812 Spondylosis without myelopathy or radiculopathy, cervical region: Secondary | ICD-10-CM | POA: Diagnosis not present

## 2021-04-17 LAB — RESP PANEL BY RT-PCR (FLU A&B, COVID) ARPGX2
Influenza A by PCR: NEGATIVE
Influenza B by PCR: NEGATIVE
SARS Coronavirus 2 by RT PCR: NEGATIVE

## 2021-04-17 LAB — PROCALCITONIN: Procalcitonin: <0.1 ng/mL

## 2021-04-17 MED ORDER — POLYETHYLENE GLYCOL 3350 17 G PO PACK: 17.0000 g | PACK | Freq: Every day | ORAL | 0 refills | Status: AC

## 2021-04-17 MED ORDER — CEFDINIR 300 MG PO CAPS: 300.0000 mg | ORAL_CAPSULE | Freq: Two times a day (BID) | ORAL | 0 refills | Status: AC

## 2021-04-17 NOTE — Progress Notes (Signed)
Mobility Specialist - Progress Note   04/17/21 1000  Mobility  Activity Transferred:  Bed to chair  Level of Assistance Standby assist, set-up cues, supervision of patient - no hands on  Assistive Device Front wheel walker  Distance Ambulated (ft) 12 ft  Mobility Ambulated with assistance in room;Out of bed to chair with meals  Mobility Response Tolerated well  Mobility performed by Mobility specialist  $Mobility charge 1 Mobility    Pre-mobility: 131 HR, 95% SpO2 During mobility: 125 HR, 91% SpO2 Post-mobility: 118 HR, 121/77 BP, 98% SPO2   Pt returning from bathroom upon arrival, utilizing 3L. Extensive recovery time with HR peaking at 131 bpm. SOB and fatigue. PLB engaged. HR decreasing to a low of 117 bpm, pt then ambulated to recliner with supervision. Mild dizziness. Alarm set, needs in reach.    Filiberto Pinks Mobility Specialist 04/17/21, 10:24 AM

## 2021-04-17 NOTE — Discharge Instructions (Signed)
Use your oxygen and inhalers as before °

## 2021-04-17 NOTE — Discharge Summary (Signed)
[facility] [place] [facility]   PATIENT NAME: Brandon [name]    MR#:  [id number]  DATE OF BIRTH:  02-15-1956  DATE OF ADMISSION:  04/12/2021 ADMITTING PHYSICIAN: Gillis Santa, MD  DATE OF DISCHARGE: 04/17/2021  PRIMARY CARE PHYSICIAN: Sherron Monday, MD    ADMISSION DIAGNOSIS:  Shortness of breath [R06.02] Unresponsive [R41.89] Respiratory failure with hypoxia and hypercapnia (HCC) [J96.91, J96.92]  DISCHARGE DIAGNOSIS:  acute on chronic respiratory failure with hypoxia and hypercapnia secondary to COPD exacerbation ongoing treatment for pneumonia SECONDARY DIAGNOSIS:   Past Medical History:  Diagnosis Date  . Anemia    IDA  . Colon polyps   . COPD (chronic obstructive pulmonary disease) (HCC)   . Coronary artery disease   . Heart attack (HCC) 2005  . Hiatal hernia   . History of ETOH abuse   . Hyperlipidemia   . Hypertension   . Pleurisy   . Schizophrenia (HCC)   . Schizophrenia (HCC)   . Seizures (HCC)    grand mal in 2000 or 2001 recovery from alcoholism  . Suicide attempt [facility])     HOSPITAL COURSE:   Brandon [name] is a 65 y.o. male with history of CAD, COPD, hyperlipidemia, schizoaffective disorder, bipolar 1 disorder, generalized weakness, debility.  Patient presented secondary to shortness of breath and unresponsiveness found to have acute respite failure with hypoxia and hypercapnia.  Patient was treated with oxygen in addition to receiving DuoNeb treatment for COPD exacerbation.   Acute respiratory failure with hypoxia and hypercapnia --In setting of recent pneumonia.   --ABG significant for mild hypercapnia with normal pH.  Patient initially required nonrebreather and then weaned down to oxygen via nasal cannula.   --Chest x-ray this admission shows improved infiltrates.  -- PT recommending SNF on discharge -Wean oxygen to room air as able   Pneumonia (from last admission) Patient was previously treated for pneumonia and recent  admission.  Patient started on vancomycin and Zosyn on 9/4 seemingly secondary to worsening airspace disease on x-ray from 9/4.  Blood cultures obtained on 9/4 -MRSA PCR negative so will discontinue vancomycin -9/6--pt on Zosyn IV; de-escalated abxs to oral since negative blood cultures -Procalcitonin <0.10 x3   COPD No obvious evidence of exacerbation.  Patient received Solu-Medrol with EMS. -Continue Dulera, DuoNeb as needed --no wheezing   Acute hypoxic encephalopathy Resolved. Mentation back to baseline   Hyperlipidemia -Continue Lipitor and ezetimibe   Hypokalemia Treated with supplemental potassium.   Depression Anxiety Schizophrenia -Continue bupropion , clozapine , hydroxyzine  as needed, Klonopin   Constipation -Continue Colace and senna --pt did have BM     patient overall improving. D/c to rehab today.    DVT prophylaxis: Lovenox Code Status:   Code Status: Full Code Family Communication: None at bedside Disposition Plan: Discharge to SNFtoday CONSULTS OBTAINED:    DRUG ALLERGIES:   Allergies  Allergen Reactions  . Compazine [Prochlorperazine] Other (See Comments)    Dystonic rxn - convulsions. Near fatal reaction.   Ardine Bjork [Iodinated Diagnostic Agents] Shortness Of Breath    Pt states SOB after last IV contrast injection  . Alcohol-Sulfur [Elemental Sulfur] Other (See Comments)    History of alcoholism  . Benadryl [Diphenhydramine] Other (See Comments)    "stuffy", nasal congestion  . Depakote [Divalproex Sodium] Other (See Comments)    Cause elevated ammonia  . Dramamine [Dimenhydrinate] Swelling  . Plavix [Clopidogrel] Other (See Comments)    Rectal bleeding. "Perforated my intestines."  .  Rosuvastatin     Other reaction(s): Other (See Comments)    DISCHARGE MEDICATIONS:   Allergies as of 04/17/2021       Reactions   Compazine [prochlorperazine] Other (See Comments)   Dystonic rxn - convulsions. Near fatal reaction.   Ivp Dye  [iodinated Diagnostic Agents] Shortness Of Breath   Pt states SOB after last IV contrast injection   Alcohol-sulfur [elemental Sulfur] Other (See Comments)   History of alcoholism   Benadryl [diphenhydramine] Other (See Comments)   "stuffy", nasal congestion   Depakote [divalproex Sodium] Other (See Comments)   Cause elevated ammonia   Dramamine [dimenhydrinate] Swelling   Plavix [clopidogrel] Other (See Comments)   Rectal bleeding. "Perforated my intestines."   Rosuvastatin    Other reaction(s): Other (See Comments)        Medication List     STOP taking these medications    amLODipine 5 MG tablet Commonly known as: NORVASC   traZODone 50 MG tablet Commonly known as: DESYREL       TAKE these medications    acetaminophen 325 MG tablet Commonly known as: TYLENOL Take 650 mg by mouth every 4 (four) hours as needed for mild pain or moderate pain.   albuterol 108 (90 Base) MCG/ACT inhaler Commonly known as: VENTOLIN HFA Inhale 2 puffs into the lungs every 6 (six) hours as needed for wheezing or shortness of breath.   atorvastatin 20 MG tablet Commonly known as: LIPITOR Take 1 tablet (20 mg total) by mouth at bedtime.   budesonide-formoterol 80-4.5 MCG/ACT inhaler Commonly known as: SYMBICORT Inhale 2 puffs into the lungs 2 (two) times daily.   buPROPion ER 100 MG 12 hr tablet Commonly known as: WELLBUTRIN SR Take 100 mg by mouth daily.   carvedilol 25 MG tablet Commonly known as: COREG Take 25 mg by mouth 2 (two) times daily with a meal.   cefdinir 300 MG capsule Commonly known as: OMNICEF Take 1 capsule (300 mg total) by mouth every 12 (twelve) hours for 4 days.   cloZAPine 100 MG tablet Commonly known as: CLOZARIL Take 150 mg by mouth at bedtime.   docusate sodium 100 MG capsule Commonly known as: COLACE Take 1 capsule (100 mg total) by mouth 2 (two) times daily. Hold if has diarrhea   dorzolamide-timolol 22.3-6.8 MG/ML ophthalmic solution Commonly  known as: COSOPT Place 1 drop into both eyes 2 (two) times daily.   ezetimibe 10 MG tablet Commonly known as: ZETIA Take 10 mg by mouth at bedtime.   furosemide 20 MG tablet Commonly known as: LASIX Take 0.5 tablets (10 mg total) by mouth daily.   guaiFENesin 600 MG 12 hr tablet Commonly known as: MUCINEX Take 1 tablet (600 mg total) by mouth 2 (two) times daily for 14 days.   hydrOXYzine 25 MG capsule Commonly known as: VISTARIL Take 1 capsule (25 mg total) by mouth 3 (three) times daily as needed for anxiety.   isosorbide mononitrate 30 MG 24 hr tablet Commonly known as: IMDUR Take 30 mg by mouth 2 (two) times daily.   latanoprost 0.005 % ophthalmic solution Commonly known as: XALATAN Place 1 drop into both eyes at bedtime.   nitroGLYCERIN 0.4 MG SL tablet Commonly known as: NITROSTAT Place 0.4 mg under the tongue every 5 (five) minutes as needed for chest pain.   olmesartan 20 MG tablet Commonly known as: BENICAR Take 20 mg by mouth daily.   pantoprazole 40 MG tablet Commonly known as: PROTONIX Take 40 mg by mouth 2 (  two) times daily.   polyethylene glycol 17 g packet Commonly known as: MIRALAX / GLYCOLAX Take 17 g by mouth daily.   senna 8.6 MG tablet Commonly known as: SENOKOT Take 1-2 tablets by mouth daily as needed for constipation.   sodium chloride 1 g tablet Take 1 tablet (1 g total) by mouth 3 (three) times daily with meals.   tamsulosin 0.4 MG Caps capsule Commonly known as: FLOMAX Take 0.4 mg by mouth daily.   Vitamin D3 25 MCG tablet Commonly known as: Vitamin D Take 1 tablet (1,000 Units total) by mouth daily.        If you experience worsening of your admission symptoms, develop shortness of breath, life threatening emergency, suicidal or homicidal thoughts you must seek medical attention immediately by calling 911 or calling your MD immediately  if symptoms less severe.  You Must read complete instructions/literature along with all the  possible adverse reactions/side effects for all the Medicines you take and that have been prescribed to you. Take any new Medicines after you have completely understood and accept all the possible adverse reactions/side effects.   Please note  You were cared for by a hospitalist during your hospital stay. If you have any questions about your discharge medications or the care you received while you were in the hospital after you are discharged, you can call the unit and asked to speak with the hospitalist on call if the hospitalist that took care of you is not available. Once you are discharged, your primary care physician will handle any further medical issues. Please note that NO REFILLS for any discharge medications will be authorized once you are discharged, as it is imperative that you return to your primary care physician (or establish a relationship with a primary care physician if you do not have one) for your aftercare needs so that they can reassess your need for medications and monitor your lab values. Today   SUBJECTIVE   Overall doing well. Appears at baseline  VITAL SIGNS:  Blood pressure 121/88, pulse (!) 103, temperature 98 F (36.7 C), temperature source Oral, resp. rate 19, height 6' (1.829 m), weight 80.5 kg, SpO2 95 %.  I/O:   Intake/Output Summary (Last 24 hours) at 04/17/2021 0826 Last data filed at 04/17/2021 0243 Gross per 24 hour  Intake 480 ml  Output 2800 ml  Net -2320 ml    PHYSICAL EXAMINATION:  GENERAL:  65 y.o.-year-old patient lying in the bed with no acute distress.  LUNGS: decreased breath sounds bilaterally bases, no wheezing, rales,rhonchi or crepitation. No use of accessory muscles of respiration.  CARDIOVASCULAR: S1, S2 normal. No murmurs, rubs, or gallops.  ABDOMEN: Soft, non-tender, non-distended. Bowel sounds present. No organomegaly or mass.  EXTREMITIES: No pedal edema, cyanosis, or clubbing.  NEUROLOGIC: non focal PSYCHIATRIC: The patient is  alert and oriented x 3.  SKIN: No obvious rash, lesion, or ulcer.   DATA REVIEW:   CBC  Recent Labs  Lab 04/16/21 0526  WBC 8.0  HGB 11.3*  HCT 36.1*  PLT 271    Chemistries  Recent Labs  Lab 04/12/21 2018 04/12/21 2321 04/15/21 0543 04/16/21 0526  NA 132*   < > 136 136  K 3.2*   < > 3.9 3.7  CL 93*   < > 100 97*  CO2 31   < > 32 33*  GLUCOSE 116*   < > 99 97  BUN 16   < > 8 6*  CREATININE 0.98   < >  0.55* 0.54*  CALCIUM 8.5*   < > 8.2* 8.5*  MG  --    < > 1.8  --   AST 17  --   --   --   ALT 25  --   --   --   ALKPHOS 55  --   --   --   BILITOT 0.8  --   --   --    < > = values in this interval not displayed.    Microbiology Results   Recent Results (from the past 240 hour(s))  Resp Panel by RT-PCR (Flu A&B, Covid) Nasopharyngeal Swab     Status: None   Collection Time: 04/11/21 11:24 AM   Specimen: Nasopharyngeal Swab; Nasopharyngeal(NP) swabs in vial transport medium  Result Value Ref Range Status   SARS Coronavirus 2 by RT PCR NEGATIVE NEGATIVE Final    Comment: (NOTE) SARS-CoV-2 target nucleic acids are NOT DETECTED.  The SARS-CoV-2 RNA is generally detectable in upper respiratory specimens during the acute phase of infection. The lowest concentration of SARS-CoV-2 viral copies this assay can detect is 138 copies/mL. A negative result does not preclude SARS-Cov-2 infection and should not be used as the sole basis for treatment or other patient management decisions. A negative result may occur with  improper specimen collection/handling, submission of specimen other than nasopharyngeal swab, presence of viral mutation(s) within the areas targeted by this assay, and inadequate number of viral copies(<138 copies/mL). A negative result must be combined with clinical observations, patient history, and epidemiological information. The expected result is Negative.  Fact Sheet for Patients:  BloggerCourse.com  Fact Sheet for  Healthcare Providers:  SeriousBroker.it  This test is no t yet approved or cleared by the [place] FDA and  has been authorized for detection and/or diagnosis of SARS-CoV-2 by FDA under an Emergency Use Authorization (EUA). This EUA will remain  in effect (meaning this test can be used) for the duration of the COVID-19 declaration under Section 564(b)(1) of the Act, 21 U.S.C.section 360bbb-3(b)(1), unless the authorization is terminated  or revoked sooner.       Influenza A by PCR NEGATIVE NEGATIVE Final   Influenza B by PCR NEGATIVE NEGATIVE Final    Comment: (NOTE) The Xpert Xpress SARS-CoV-2/FLU/RSV plus assay is intended as an aid in the diagnosis of influenza from Nasopharyngeal swab specimens and should not be used as a sole basis for treatment. Nasal washings and aspirates are unacceptable for Xpert Xpress SARS-CoV-2/FLU/RSV testing.  Fact Sheet for Patients: BloggerCourse.com  Fact Sheet for Healthcare Providers: SeriousBroker.it  This test is not yet approved or cleared by the [place] FDA and has been authorized for detection and/or diagnosis of SARS-CoV-2 by FDA under an Emergency Use Authorization (EUA). This EUA will remain in effect (meaning this test can be used) for the duration of the COVID-19 declaration under Section 564(b)(1) of the Act, 21 U.S.C. section 360bbb-3(b)(1), unless the authorization is terminated or revoked.  Performed at [facility], [address] [place], [place] [address]   Resp Panel by RT-PCR (Flu A&B, Covid) Nasopharyngeal Swab     Status: None   Collection Time: 04/12/21  8:32 PM   Specimen: Nasopharyngeal Swab; Nasopharyngeal(NP) swabs in vial transport medium  Result Value Ref Range Status   SARS Coronavirus 2 by RT PCR NEGATIVE NEGATIVE Final    Comment: (NOTE) SARS-CoV-2 target nucleic acids are NOT DETECTED.  The SARS-CoV-2 RNA is  generally detectable in upper respiratory specimens during the acute  phase of infection. The lowest concentration of SARS-CoV-2 viral copies this assay can detect is 138 copies/mL. A negative result does not preclude SARS-Cov-2 infection and should not be used as the sole basis for treatment or other patient management decisions. A negative result may occur with  improper specimen collection/handling, submission of specimen other than nasopharyngeal swab, presence of viral mutation(s) within the areas targeted by this assay, and inadequate number of viral copies(<138 copies/mL). A negative result must be combined with clinical observations, patient history, and epidemiological information. The expected result is Negative.  Fact Sheet for Patients:  BloggerCourse.com  Fact Sheet for Healthcare Providers:  SeriousBroker.it  This test is no t yet approved or cleared by the [place] FDA and  has been authorized for detection and/or diagnosis of SARS-CoV-2 by FDA under an Emergency Use Authorization (EUA). This EUA will remain  in effect (meaning this test can be used) for the duration of the COVID-19 declaration under Section 564(b)(1) of the Act, 21 U.S.C.section 360bbb-3(b)(1), unless the authorization is terminated  or revoked sooner.       Influenza A by PCR NEGATIVE NEGATIVE Final   Influenza B by PCR NEGATIVE NEGATIVE Final    Comment: (NOTE) The Xpert Xpress SARS-CoV-2/FLU/RSV plus assay is intended as an aid in the diagnosis of influenza from Nasopharyngeal swab specimens and should not be used as a sole basis for treatment. Nasal washings and aspirates are unacceptable for Xpert Xpress SARS-CoV-2/FLU/RSV testing.  Fact Sheet for Patients: BloggerCourse.com  Fact Sheet for Healthcare Providers: SeriousBroker.it  This test is not yet approved or cleared by the [place] FDA and has been authorized for detection and/or diagnosis of SARS-CoV-2 by FDA under an Emergency Use Authorization (EUA). This EUA will remain in effect (meaning this test can be used) for the duration of the COVID-19 declaration under Section 564(b)(1) of the Act, 21 U.S.C. section 360bbb-3(b)(1), unless the authorization is terminated or revoked.  Performed at [facility], [address] [place], [place] [address]   CULTURE, BLOOD (ROUTINE X 2) w Reflex to ID Panel     Status: None (Preliminary result)   Collection Time: 04/14/21  6:43 PM   Specimen: BLOOD  Result Value Ref Range Status   Specimen Description BLOOD BLOOD LEFT HAND  Final   Special Requests   Final    BOTTLES DRAWN AEROBIC AND ANAEROBIC Blood Culture adequate volume   Culture   Final    NO GROWTH 3 DAYS Performed at [facility], [address] [place], [place] [address]    Report Status PENDING  Incomplete  CULTURE, BLOOD (ROUTINE X 2) w Reflex to ID Panel     Status: None (Preliminary result)   Collection Time: 04/14/21  6:43 PM   Specimen: BLOOD  Result Value Ref Range Status   Specimen Description BLOOD BLOOD RIGHT HAND  Final   Special Requests   Final    BOTTLES DRAWN AEROBIC AND ANAEROBIC Blood Culture adequate volume   Culture   Final    NO GROWTH 3 DAYS Performed at [facility], [address], [place], [place] [address]    Report Status PENDING  Incomplete  MRSA Next Gen by PCR, Nasal     Status: None   Collection Time: 04/14/21  6:47 PM   Specimen: Nasal Mucosa; Nasal Swab  Result Value Ref Range Status   MRSA by PCR Next Gen NOT DETECTED NOT DETECTED Final    Comment: (NOTE) The GeneXpert MRSA Assay (  FDA approved for NASAL specimens only), is one component of a comprehensive MRSA colonization surveillance program. It is not intended to diagnose MRSA infection nor to guide or monitor treatment for MRSA infections. Test performance is not FDA approved  in patients less than 57 years old. Performed at [facility], [address] [place] [place], [place] [address]     RADIOLOGY:  [facility] Chest Port 1 View  Result Date: 04/15/2021 CLINICAL DATA:  Shortness of breath EXAM: PORTABLE CHEST 1 VIEW COMPARISON:  04/14/2021, CT 04/05/2021 FINDINGS: Low lung volumes. Atelectasis or scarring in the left lower lung. Improved aeration of left lung base. Lucency beneath the right diaphragm likely due to colon interposition. Stable cardiomediastinal silhouette with aortic atherosclerosis. IMPRESSION: Low lung volumes with slightly improved aeration left base. There is residual atelectasis at left base Electronically Signed   By: Jasmine Pang M.D.   On: 04/15/2021 18:36     CODE STATUS:     Code Status Orders  (From admission, onward)           Start     Ordered   04/12/21 2216  Full code  Continuous        04/12/21 2216           Code Status History     Date Active Date Inactive Code Status Order ID Comments User Context   04/03/2021 2041 04/12/2021 0143 Full Code [id number]  Mansy, Vernetta Honey, MD ED   03/22/2021 1851 03/25/2021 2231 Full Code [id number]  Benita Gutter T, DO ED   11/27/2020 2331 12/03/2020 2155 Full Code [id number]  Acheampong, Genice Rouge, MD ED   07/17/2018 1249 07/20/2018 1726 Full Code [id number]  Sung Amabile, DO Inpatient   05/04/2018 2029 05/07/2018 1634 Full Code [id number]  Sung Amabile, DO Inpatient   04/08/2018 2011 04/12/2018 1441 Full Code [id number]  Sung Amabile, DO Inpatient   10/19/2017 1252 10/19/2017 1820 Full Code [id number]  Laurier Nancy, MD Inpatient        TOTAL TIME TAKING CARE OF THIS PATIENT: 40 minutes.    Enedina Finner M.D  [facility]    CC: Primary care physician; Sherron Monday, MD

## 2021-04-17 NOTE — Progress Notes (Signed)
Report given to [facility]. All questions answered. VSS.

## 2021-04-17 NOTE — TOC Transition Note (Signed)
Transition of Care [facility]) - CM/SW Discharge Note   Patient Details  Name: Brandon [name] MRN: [id number] Date of Birth: 12-29-1955  [facility] CM/SW Contact:  Chapman Fitch, RN Phone Number: 04/17/2021, 10:20 AM   Clinical Narrative:    [facility]  Anticipated DC date: 04/17/21 Family notified:Patient to update family Transport by:EMS  Per MD patient ready for DC to . RN, patient, patient's family, and facility notified of DC. Discharge Summary sent to facility. RN given number for report. DC packet on chart. Ambulance transport requested for patient.  TOC signing off.  Bevelyn Ngo [facility] [phone]    Final next level of care: Skilled Nursing Facility     Patient Goals and CMS Choice        Discharge Placement              Patient chooses bed at: [facility] [place] Patient to be transferred to facility by: EMS      Discharge Plan and Services                                     Social Determinants of Health (SDOH) Interventions     Readmission Risk Interventions Readmission Risk Prevention Plan 04/09/2021 03/24/2021  Transportation Screening Complete Complete  PCP or Specialist Appt within 5-7 Days - Complete  Home Care Screening - Complete  Medication Review (RN CM) - Complete  Medication Review (RN Care Manager) Complete -  PCP or Specialist appointment within 3-5 days of discharge Complete -  HRI or Home Care Consult Complete -  SW Recovery Care/Counseling Consult Complete -  Palliative Care Screening Not Applicable -  Skilled Nursing Facility Complete -  Some recent data might be hidden

## 2021-04-17 NOTE — Care Management Important Message (Signed)
Important Message  Patient Details  Name: Brandon [name] MRN: [id number] Date of Birth: [birth year]   Medicare Important Message Given:  N/A - LOS <3 / Initial given by admissions     [name] 04/17/2021, 11:09 AM

## 2021-04-18 DIAGNOSIS — I2510 Atherosclerotic heart disease of native coronary artery without angina pectoris: Secondary | ICD-10-CM | POA: Diagnosis not present

## 2021-04-18 DIAGNOSIS — J449 Chronic obstructive pulmonary disease, unspecified: Secondary | ICD-10-CM | POA: Diagnosis not present

## 2021-04-18 DIAGNOSIS — E871 Hypo-osmolality and hyponatremia: Secondary | ICD-10-CM | POA: Diagnosis not present

## 2021-04-18 DIAGNOSIS — M6281 Muscle weakness (generalized): Secondary | ICD-10-CM | POA: Diagnosis not present

## 2021-04-19 DIAGNOSIS — E871 Hypo-osmolality and hyponatremia: Secondary | ICD-10-CM | POA: Diagnosis not present

## 2021-04-19 LAB — CULTURE, BLOOD (ROUTINE X 2)
Culture: NO GROWTH
Culture: NO GROWTH
Special Requests: ADEQUATE
Special Requests: ADEQUATE

## 2021-04-22 DIAGNOSIS — M6281 Muscle weakness (generalized): Secondary | ICD-10-CM | POA: Diagnosis not present

## 2021-04-22 DIAGNOSIS — J9600 Acute respiratory failure, unspecified whether with hypoxia or hypercapnia: Secondary | ICD-10-CM | POA: Diagnosis not present

## 2021-04-22 DIAGNOSIS — E876 Hypokalemia: Secondary | ICD-10-CM | POA: Diagnosis not present

## 2021-04-22 DIAGNOSIS — E871 Hypo-osmolality and hyponatremia: Secondary | ICD-10-CM | POA: Diagnosis not present

## 2021-04-22 DIAGNOSIS — J449 Chronic obstructive pulmonary disease, unspecified: Secondary | ICD-10-CM | POA: Diagnosis not present

## 2021-04-26 DIAGNOSIS — J441 Chronic obstructive pulmonary disease with (acute) exacerbation: Secondary | ICD-10-CM | POA: Diagnosis not present

## 2021-04-26 DIAGNOSIS — E871 Hypo-osmolality and hyponatremia: Secondary | ICD-10-CM | POA: Diagnosis not present

## 2021-04-26 DIAGNOSIS — E876 Hypokalemia: Secondary | ICD-10-CM | POA: Diagnosis not present

## 2021-04-28 ENCOUNTER — Inpatient Hospital Stay
Admission: EM | Admit: 2021-04-28 | Discharge: 2021-05-17 | DRG: 208 | Disposition: A | Discharge status: Skilled Nursing Facility | Payer: Medicare Other | Source: Skilled Nursing Facility | Attending: Internal Medicine | Admitting: Internal Medicine

## 2021-04-28 ENCOUNTER — Inpatient Hospital Stay: Payer: Medicare Other

## 2021-04-28 ENCOUNTER — Emergency Department: Payer: Medicare Other

## 2021-04-28 ENCOUNTER — Other Ambulatory Visit: Payer: Self-pay

## 2021-04-28 ENCOUNTER — Encounter: Payer: Self-pay | Admitting: Radiology

## 2021-04-28 DIAGNOSIS — E871 Hypo-osmolality and hyponatremia: Secondary | ICD-10-CM

## 2021-04-28 DIAGNOSIS — Z8249 Family history of ischemic heart disease and other diseases of the circulatory system: Secondary | ICD-10-CM

## 2021-04-28 DIAGNOSIS — G9341 Metabolic encephalopathy: Secondary | ICD-10-CM

## 2021-04-28 DIAGNOSIS — J9601 Acute respiratory failure with hypoxia: Secondary | ICD-10-CM | POA: Diagnosis not present

## 2021-04-28 DIAGNOSIS — J690 Pneumonitis due to inhalation of food and vomit: Secondary | ICD-10-CM | POA: Diagnosis not present

## 2021-04-28 DIAGNOSIS — Z5320 Procedure and treatment not carried out because of patient's decision for unspecified reasons: Secondary | ICD-10-CM | POA: Diagnosis present

## 2021-04-28 DIAGNOSIS — J9811 Atelectasis: Secondary | ICD-10-CM | POA: Diagnosis not present

## 2021-04-28 DIAGNOSIS — Z91041 Radiographic dye allergy status: Secondary | ICD-10-CM

## 2021-04-28 DIAGNOSIS — J9621 Acute and chronic respiratory failure with hypoxia: Secondary | ICD-10-CM | POA: Diagnosis not present

## 2021-04-28 DIAGNOSIS — I639 Cerebral infarction, unspecified: Secondary | ICD-10-CM

## 2021-04-28 DIAGNOSIS — I6523 Occlusion and stenosis of bilateral carotid arteries: Secondary | ICD-10-CM | POA: Diagnosis not present

## 2021-04-28 DIAGNOSIS — Z20822 Contact with and (suspected) exposure to covid-19: Secondary | ICD-10-CM | POA: Diagnosis not present

## 2021-04-28 DIAGNOSIS — K5900 Constipation, unspecified: Secondary | ICD-10-CM | POA: Diagnosis not present

## 2021-04-28 DIAGNOSIS — J90 Pleural effusion, not elsewhere classified: Secondary | ICD-10-CM | POA: Diagnosis not present

## 2021-04-28 DIAGNOSIS — Z79899 Other long term (current) drug therapy: Secondary | ICD-10-CM

## 2021-04-28 DIAGNOSIS — N400 Enlarged prostate without lower urinary tract symptoms: Secondary | ICD-10-CM | POA: Diagnosis present

## 2021-04-28 DIAGNOSIS — E785 Hyperlipidemia, unspecified: Secondary | ICD-10-CM | POA: Diagnosis not present

## 2021-04-28 DIAGNOSIS — J449 Chronic obstructive pulmonary disease, unspecified: Secondary | ICD-10-CM | POA: Diagnosis not present

## 2021-04-28 DIAGNOSIS — J9622 Acute and chronic respiratory failure with hypercapnia: Secondary | ICD-10-CM | POA: Diagnosis not present

## 2021-04-28 DIAGNOSIS — R490 Dysphonia: Secondary | ICD-10-CM | POA: Diagnosis not present

## 2021-04-28 DIAGNOSIS — Z7189 Other specified counseling: Secondary | ICD-10-CM

## 2021-04-28 DIAGNOSIS — Z955 Presence of coronary angioplasty implant and graft: Secondary | ICD-10-CM

## 2021-04-28 DIAGNOSIS — R0602 Shortness of breath: Secondary | ICD-10-CM

## 2021-04-28 DIAGNOSIS — Z978 Presence of other specified devices: Secondary | ICD-10-CM

## 2021-04-28 DIAGNOSIS — E860 Dehydration: Secondary | ICD-10-CM | POA: Diagnosis not present

## 2021-04-28 DIAGNOSIS — R4182 Altered mental status, unspecified: Secondary | ICD-10-CM | POA: Diagnosis not present

## 2021-04-28 DIAGNOSIS — R54 Age-related physical debility: Secondary | ICD-10-CM | POA: Diagnosis present

## 2021-04-28 DIAGNOSIS — Z515 Encounter for palliative care: Secondary | ICD-10-CM | POA: Diagnosis not present

## 2021-04-28 DIAGNOSIS — Z743 Need for continuous supervision: Secondary | ICD-10-CM | POA: Diagnosis not present

## 2021-04-28 DIAGNOSIS — J9690 Respiratory failure, unspecified, unspecified whether with hypoxia or hypercapnia: Secondary | ICD-10-CM

## 2021-04-28 DIAGNOSIS — F250 Schizoaffective disorder, bipolar type: Secondary | ICD-10-CM | POA: Diagnosis present

## 2021-04-28 DIAGNOSIS — Z0189 Encounter for other specified special examinations: Secondary | ICD-10-CM

## 2021-04-28 DIAGNOSIS — I951 Orthostatic hypotension: Secondary | ICD-10-CM | POA: Diagnosis not present

## 2021-04-28 DIAGNOSIS — Z66 Do not resuscitate: Secondary | ICD-10-CM

## 2021-04-28 DIAGNOSIS — Z7401 Bed confinement status: Secondary | ICD-10-CM | POA: Diagnosis not present

## 2021-04-28 DIAGNOSIS — J383 Other diseases of vocal cords: Secondary | ICD-10-CM | POA: Diagnosis present

## 2021-04-28 DIAGNOSIS — R531 Weakness: Secondary | ICD-10-CM | POA: Diagnosis not present

## 2021-04-28 DIAGNOSIS — I252 Old myocardial infarction: Secondary | ICD-10-CM | POA: Diagnosis not present

## 2021-04-28 DIAGNOSIS — Z4682 Encounter for fitting and adjustment of non-vascular catheter: Secondary | ICD-10-CM | POA: Diagnosis not present

## 2021-04-28 DIAGNOSIS — R4189 Other symptoms and signs involving cognitive functions and awareness: Secondary | ICD-10-CM | POA: Diagnosis not present

## 2021-04-28 DIAGNOSIS — G8311 Monoplegia of lower limb affecting right dominant side: Secondary | ICD-10-CM | POA: Diagnosis not present

## 2021-04-28 DIAGNOSIS — R4701 Aphasia: Secondary | ICD-10-CM | POA: Diagnosis not present

## 2021-04-28 DIAGNOSIS — M722 Plantar fascial fibromatosis: Secondary | ICD-10-CM

## 2021-04-28 DIAGNOSIS — F2089 Other schizophrenia: Secondary | ICD-10-CM | POA: Diagnosis not present

## 2021-04-28 DIAGNOSIS — R29898 Other symptoms and signs involving the musculoskeletal system: Secondary | ICD-10-CM

## 2021-04-28 DIAGNOSIS — R000 Tachycardia, unspecified: Secondary | ICD-10-CM | POA: Diagnosis not present

## 2021-04-28 DIAGNOSIS — J9600 Acute respiratory failure, unspecified whether with hypoxia or hypercapnia: Secondary | ICD-10-CM | POA: Diagnosis present

## 2021-04-28 DIAGNOSIS — G4700 Insomnia, unspecified: Secondary | ICD-10-CM | POA: Diagnosis present

## 2021-04-28 DIAGNOSIS — R0603 Acute respiratory distress: Secondary | ICD-10-CM

## 2021-04-28 DIAGNOSIS — Z888 Allergy status to other drugs, medicaments and biological substances status: Secondary | ICD-10-CM

## 2021-04-28 DIAGNOSIS — Z8673 Personal history of transient ischemic attack (TIA), and cerebral infarction without residual deficits: Secondary | ICD-10-CM | POA: Diagnosis not present

## 2021-04-28 DIAGNOSIS — R0600 Dyspnea, unspecified: Secondary | ICD-10-CM

## 2021-04-28 DIAGNOSIS — I10 Essential (primary) hypertension: Secondary | ICD-10-CM | POA: Diagnosis present

## 2021-04-28 DIAGNOSIS — E875 Hyperkalemia: Secondary | ICD-10-CM | POA: Diagnosis not present

## 2021-04-28 DIAGNOSIS — M47812 Spondylosis without myelopathy or radiculopathy, cervical region: Secondary | ICD-10-CM | POA: Diagnosis not present

## 2021-04-28 DIAGNOSIS — R918 Other nonspecific abnormal finding of lung field: Secondary | ICD-10-CM | POA: Diagnosis not present

## 2021-04-28 DIAGNOSIS — R1310 Dysphagia, unspecified: Secondary | ICD-10-CM | POA: Diagnosis present

## 2021-04-28 DIAGNOSIS — R479 Unspecified speech disturbances: Secondary | ICD-10-CM | POA: Diagnosis not present

## 2021-04-28 DIAGNOSIS — I2510 Atherosclerotic heart disease of native coronary artery without angina pectoris: Secondary | ICD-10-CM

## 2021-04-28 DIAGNOSIS — F209 Schizophrenia, unspecified: Secondary | ICD-10-CM | POA: Diagnosis present

## 2021-04-28 DIAGNOSIS — Z9911 Dependence on respirator [ventilator] status: Secondary | ICD-10-CM

## 2021-04-28 DIAGNOSIS — R4020 Unspecified coma: Secondary | ICD-10-CM | POA: Diagnosis not present

## 2021-04-28 DIAGNOSIS — G9340 Encephalopathy, unspecified: Secondary | ICD-10-CM | POA: Diagnosis not present

## 2021-04-28 DIAGNOSIS — Z452 Encounter for adjustment and management of vascular access device: Secondary | ICD-10-CM | POA: Diagnosis not present

## 2021-04-28 DIAGNOSIS — I700 Atherosclerosis of aorta: Secondary | ICD-10-CM | POA: Diagnosis not present

## 2021-04-28 DIAGNOSIS — E222 Syndrome of inappropriate secretion of antidiuretic hormone: Secondary | ICD-10-CM | POA: Diagnosis present

## 2021-04-28 DIAGNOSIS — J189 Pneumonia, unspecified organism: Secondary | ICD-10-CM

## 2021-04-28 DIAGNOSIS — Z89422 Acquired absence of other left toe(s): Secondary | ICD-10-CM

## 2021-04-28 DIAGNOSIS — R6889 Other general symptoms and signs: Secondary | ICD-10-CM | POA: Diagnosis not present

## 2021-04-28 DIAGNOSIS — E559 Vitamin D deficiency, unspecified: Secondary | ICD-10-CM | POA: Diagnosis present

## 2021-04-28 DIAGNOSIS — I959 Hypotension, unspecified: Secondary | ICD-10-CM | POA: Insufficient documentation

## 2021-04-28 DIAGNOSIS — R0902 Hypoxemia: Secondary | ICD-10-CM | POA: Diagnosis not present

## 2021-04-28 DIAGNOSIS — R404 Transient alteration of awareness: Secondary | ICD-10-CM | POA: Diagnosis not present

## 2021-04-28 DIAGNOSIS — G319 Degenerative disease of nervous system, unspecified: Secondary | ICD-10-CM | POA: Diagnosis not present

## 2021-04-28 DIAGNOSIS — R5381 Other malaise: Secondary | ICD-10-CM | POA: Diagnosis not present

## 2021-04-28 DIAGNOSIS — Z7951 Long term (current) use of inhaled steroids: Secondary | ICD-10-CM

## 2021-04-28 DIAGNOSIS — F10239 Alcohol dependence with withdrawal, unspecified: Secondary | ICD-10-CM | POA: Diagnosis present

## 2021-04-28 DIAGNOSIS — R9431 Abnormal electrocardiogram [ECG] [EKG]: Secondary | ICD-10-CM | POA: Diagnosis not present

## 2021-04-28 DIAGNOSIS — J9602 Acute respiratory failure with hypercapnia: Secondary | ICD-10-CM | POA: Diagnosis not present

## 2021-04-28 DIAGNOSIS — R471 Dysarthria and anarthria: Secondary | ICD-10-CM

## 2021-04-28 DIAGNOSIS — Z9151 Personal history of suicidal behavior: Secondary | ICD-10-CM

## 2021-04-28 DIAGNOSIS — Z87891 Personal history of nicotine dependence: Secondary | ICD-10-CM

## 2021-04-28 DIAGNOSIS — J441 Chronic obstructive pulmonary disease with (acute) exacerbation: Secondary | ICD-10-CM | POA: Diagnosis not present

## 2021-04-28 LAB — URINE DRUG SCREEN, QUALITATIVE (ARMC ONLY)
Amphetamines, Ur Screen: NOT DETECTED
Barbiturates, Ur Screen: NOT DETECTED
Benzodiazepine, Ur Scrn: NOT DETECTED
Cannabinoid 50 Ng, Ur ~~LOC~~: NOT DETECTED
Cocaine Metabolite,Ur ~~LOC~~: NOT DETECTED
MDMA (Ecstasy)Ur Screen: NOT DETECTED
Methadone Scn, Ur: NOT DETECTED
Opiate, Ur Screen: NOT DETECTED
Phencyclidine (PCP) Ur S: NOT DETECTED
Tricyclic, Ur Screen: POSITIVE — AB

## 2021-04-28 LAB — BLOOD GAS, VENOUS
Acid-Base Excess: 0.9 mmol/L (ref 0.0–2.0)
Bicarbonate: 29.2 mmol/L — ABNORMAL HIGH (ref 20.0–28.0)
O2 Saturation: 52.9 %
Patient temperature: 37
pCO2, Ven: 65 mmHg — ABNORMAL HIGH (ref 44.0–60.0)
pH, Ven: 7.26 (ref 7.250–7.430)
pO2, Ven: 33 mmHg (ref 32.0–45.0)

## 2021-04-28 LAB — URINALYSIS, COMPLETE (UACMP) WITH MICROSCOPIC
Bilirubin Urine: NEGATIVE
Glucose, UA: NEGATIVE mg/dL
Hgb urine dipstick: NEGATIVE
Ketones, ur: NEGATIVE mg/dL
Leukocytes,Ua: NEGATIVE
Nitrite: NEGATIVE
Protein, ur: NEGATIVE mg/dL
Specific Gravity, Urine: 1.01 (ref 1.005–1.030)
pH: 7 (ref 5.0–8.0)

## 2021-04-28 LAB — BLOOD GAS, ARTERIAL
Acid-Base Excess: 0.9 mmol/L (ref 0.0–2.0)
Bicarbonate: 26.6 mmol/L (ref 20.0–28.0)
FIO2: 0.6
MECHVT: 475 mL
Mechanical Rate: 18
O2 Saturation: 91.7 %
PEEP: 5 cmH2O
Patient temperature: 37
pCO2 arterial: 46 mmHg (ref 32.0–48.0)
pH, Arterial: 7.37 (ref 7.350–7.450)
pO2, Arterial: 65 mmHg — ABNORMAL LOW (ref 83.0–108.0)

## 2021-04-28 LAB — RESP PANEL BY RT-PCR (FLU A&B, COVID) ARPGX2
Influenza A by PCR: NEGATIVE
Influenza B by PCR: NEGATIVE
SARS Coronavirus 2 by RT PCR: NEGATIVE

## 2021-04-28 LAB — COMPREHENSIVE METABOLIC PANEL
ALT: 13 U/L (ref 0–44)
AST: 16 U/L (ref 15–41)
Albumin: 2.9 g/dL — ABNORMAL LOW (ref 3.5–5.0)
Alkaline Phosphatase: 78 U/L (ref 38–126)
Anion gap: 8 (ref 5–15)
BUN: 7 mg/dL — ABNORMAL LOW (ref 8–23)
CO2: 28 mmol/L (ref 22–32)
Calcium: 8.4 mg/dL — ABNORMAL LOW (ref 8.9–10.3)
Chloride: 86 mmol/L — ABNORMAL LOW (ref 98–111)
Creatinine, Ser: 1.15 mg/dL (ref 0.61–1.24)
GFR, Estimated: >60 mL/min (ref >60)
Glucose, Bld: 129 mg/dL — ABNORMAL HIGH (ref 70–99)
Potassium: 4.5 mmol/L (ref 3.5–5.1)
Sodium: 122 mmol/L — ABNORMAL LOW (ref 135–145)
Total Bilirubin: 0.7 mg/dL (ref 0.3–1.2)
Total Protein: 5.3 g/dL — ABNORMAL LOW (ref 6.5–8.1)

## 2021-04-28 LAB — CBC WITH DIFFERENTIAL/PLATELET
Abs Immature Granulocytes: 0.17 10*3/uL — ABNORMAL HIGH (ref 0.00–0.07)
Basophils Absolute: 0 10*3/uL (ref 0.0–0.1)
Basophils Relative: 0 %
Eosinophils Absolute: 0.1 10*3/uL (ref 0.0–0.5)
Eosinophils Relative: 1 %
HCT: 32.5 % — ABNORMAL LOW (ref 39.0–52.0)
Hemoglobin: 10.9 g/dL — ABNORMAL LOW (ref 13.0–17.0)
Immature Granulocytes: 2 %
Lymphocytes Relative: 9 %
Lymphs Abs: 0.9 10*3/uL (ref 0.7–4.0)
MCH: 29.5 pg (ref 26.0–34.0)
MCHC: 33.5 g/dL (ref 30.0–36.0)
MCV: 87.8 fL (ref 80.0–100.0)
Monocytes Absolute: 0.9 10*3/uL (ref 0.1–1.0)
Monocytes Relative: 9 %
Neutro Abs: 7.6 10*3/uL (ref 1.7–7.7)
Neutrophils Relative %: 79 %
Platelets: 266 10*3/uL (ref 150–400)
RBC: 3.7 MIL/uL — ABNORMAL LOW (ref 4.22–5.81)
RDW: 13.7 % (ref 11.5–15.5)
WBC: 9.6 10*3/uL (ref 4.0–10.5)
nRBC: 0 % (ref 0.0–0.2)

## 2021-04-28 LAB — APTT: aPTT: 22 seconds — ABNORMAL LOW (ref 24–36)

## 2021-04-28 LAB — PROTIME-INR
INR: 0.9 (ref 0.8–1.2)
Prothrombin Time: 12 seconds (ref 11.4–15.2)

## 2021-04-28 LAB — LACTIC ACID, PLASMA: Lactic Acid, Venous: 1 mmol/L (ref 0.5–1.9)

## 2021-04-28 MED ORDER — SODIUM CHLORIDE 0.9 % IV SOLN
500.0000 mg | Freq: Once | INTRAVENOUS | Status: AC
  Administered 2021-04-28: 500 mg via INTRAVENOUS
  Filled 2021-04-28: qty 500

## 2021-04-28 MED ORDER — FENTANYL CITRATE PF 50 MCG/ML IJ SOSY: 25.0000 ug | PREFILLED_SYRINGE | INTRAMUSCULAR | Status: DC | PRN

## 2021-04-28 MED ORDER — DEXMEDETOMIDINE HCL IN NACL 400 MCG/100ML IV SOLN
0.4000 ug/kg/h | INTRAVENOUS | Status: DC
  Administered 2021-04-28: 0.4 ug/kg/h via INTRAVENOUS
  Filled 2021-04-28 (×2): qty 100

## 2021-04-28 MED ORDER — SODIUM CHLORIDE 0.9 % IV SOLN: INTRAVENOUS | Status: AC

## 2021-04-28 MED ORDER — LACTATED RINGERS IV BOLUS (SEPSIS)
1000.0000 mL | Freq: Once | INTRAVENOUS | Status: AC
  Administered 2021-04-28: 1000 mL via INTRAVENOUS

## 2021-04-28 MED ORDER — SODIUM CHLORIDE 0.9 % IV SOLN
2.0000 g | Freq: Three times a day (TID) | INTRAVENOUS | Status: DC
  Administered 2021-04-28 – 2021-04-29 (×2): 2 g via INTRAVENOUS
  Filled 2021-04-28 (×4): qty 2

## 2021-04-28 MED ORDER — BUDESONIDE 0.25 MG/2ML IN SUSP
0.2500 mg | Freq: Two times a day (BID) | RESPIRATORY_TRACT | Status: DC
  Administered 2021-04-29: 0.25 mg via RESPIRATORY_TRACT
  Filled 2021-04-28: qty 2

## 2021-04-28 MED ORDER — DOCUSATE SODIUM 100 MG PO CAPS
100.0000 mg | ORAL_CAPSULE | Freq: Two times a day (BID) | ORAL | Status: DC | PRN
  Administered 2021-05-01: 100 mg via ORAL
  Filled 2021-04-28: qty 1

## 2021-04-28 MED ORDER — IPRATROPIUM-ALBUTEROL 0.5-2.5 (3) MG/3ML IN SOLN
3.0000 mL | Freq: Four times a day (QID) | RESPIRATORY_TRACT | Status: DC
  Administered 2021-04-29 (×2): 3 mL via RESPIRATORY_TRACT
  Filled 2021-04-28 (×2): qty 3

## 2021-04-28 MED ORDER — VANCOMYCIN HCL IN DEXTROSE 1-5 GM/200ML-% IV SOLN: 1000.0000 mg | Freq: Once | INTRAVENOUS | Status: DC

## 2021-04-28 MED ORDER — METHYLPREDNISOLONE SODIUM SUCC 40 MG IJ SOLR
40.0000 mg | Freq: Two times a day (BID) | INTRAMUSCULAR | Status: DC
  Administered 2021-04-29: 40 mg via INTRAVENOUS
  Filled 2021-04-28: qty 1

## 2021-04-28 MED ORDER — POLYETHYLENE GLYCOL 3350 17 G PO PACK: 17.0000 g | PACK | Freq: Every day | ORAL | Status: DC | PRN

## 2021-04-28 MED ORDER — FENTANYL CITRATE PF 50 MCG/ML IJ SOSY
25.0000 ug | PREFILLED_SYRINGE | INTRAMUSCULAR | Status: DC | PRN
  Administered 2021-04-28: 25 ug via INTRAVENOUS
  Filled 2021-04-28: qty 1

## 2021-04-28 MED ORDER — METRONIDAZOLE 500 MG/100ML IV SOLN
500.0000 mg | Freq: Three times a day (TID) | INTRAVENOUS | Status: DC
Start: 2021-04-28 — End: 2021-04-29
  Administered 2021-04-29 (×2): 500 mg via INTRAVENOUS
  Filled 2021-04-28 (×4): qty 100

## 2021-04-28 MED ORDER — MIDAZOLAM HCL 2 MG/2ML IJ SOLN
1.0000 mg | INTRAMUSCULAR | Status: DC | PRN
  Administered 2021-04-28: 1 mg via INTRAVENOUS
  Filled 2021-04-28: qty 2

## 2021-04-28 MED ORDER — ETOMIDATE 2 MG/ML IV SOLN
INTRAVENOUS | Status: AC
  Administered 2021-04-28: 20 mg
  Filled 2021-04-28: qty 10

## 2021-04-28 MED ORDER — SODIUM CHLORIDE 0.9 % IV SOLN: 2.0000 g | Freq: Three times a day (TID) | INTRAVENOUS | Status: DC

## 2021-04-28 MED ORDER — LACTATED RINGERS IV SOLN: INTRAVENOUS | Status: DC

## 2021-04-28 MED ORDER — MIDAZOLAM HCL 2 MG/2ML IJ SOLN: 1.0000 mg | INTRAMUSCULAR | Status: DC | PRN

## 2021-04-28 MED ORDER — DOCUSATE SODIUM 50 MG/5ML PO LIQD
100.0000 mg | Freq: Two times a day (BID) | ORAL | Status: DC
  Filled 2021-04-28 (×2): qty 10

## 2021-04-28 MED ORDER — PANTOPRAZOLE SODIUM 40 MG IV SOLR
40.0000 mg | Freq: Every day | INTRAVENOUS | Status: DC
  Administered 2021-04-28: 40 mg via INTRAVENOUS
  Filled 2021-04-28: qty 40

## 2021-04-28 MED ORDER — VANCOMYCIN HCL IN DEXTROSE 1-5 GM/200ML-% IV SOLN
1000.0000 mg | Freq: Two times a day (BID) | INTRAVENOUS | Status: DC
Start: 2021-04-29 — End: 2021-04-29
  Administered 2021-04-29: 1000 mg via INTRAVENOUS
  Filled 2021-04-28 (×2): qty 200

## 2021-04-28 MED ORDER — VANCOMYCIN HCL 2000 MG/400ML IV SOLN
2000.0000 mg | Freq: Once | INTRAVENOUS | Status: AC
  Administered 2021-04-28: 2000 mg via INTRAVENOUS
  Filled 2021-04-28: qty 400

## 2021-04-28 MED ORDER — IPRATROPIUM-ALBUTEROL 0.5-2.5 (3) MG/3ML IN SOLN: 3.0000 mL | RESPIRATORY_TRACT | Status: DC | PRN

## 2021-04-28 MED ORDER — ENOXAPARIN SODIUM 40 MG/0.4ML IJ SOSY
40.0000 mg | PREFILLED_SYRINGE | INTRAMUSCULAR | Status: DC
  Administered 2021-04-28 – 2021-05-16 (×19): 40 mg via SUBCUTANEOUS
  Filled 2021-04-28 (×19): qty 0.4

## 2021-04-28 MED ORDER — SODIUM CHLORIDE 0.9 % IV SOLN: 2.0000 g | Freq: Once | INTRAVENOUS | Status: DC

## 2021-04-28 MED ORDER — POLYETHYLENE GLYCOL 3350 17 G PO PACK: 17.0000 g | PACK | Freq: Every day | ORAL | Status: DC

## 2021-04-28 MED ORDER — ROCURONIUM BROMIDE 10 MG/ML (PF) SYRINGE
PREFILLED_SYRINGE | INTRAVENOUS | Status: AC
  Administered 2021-04-28: 100 mg via INTRAVENOUS
  Filled 2021-04-28: qty 10

## 2021-04-28 NOTE — ED Notes (Signed)
Patient at CT scan.

## 2021-04-28 NOTE — Progress Notes (Signed)
Pharmacy Antibiotic Note  Brandon [name] is a 65 y.o. male admitted on 04/28/2021 with pneumonia.  Pharmacy has been consulted for vancomycin and cefepime dosing.  Patient is afebrile, LA 1.0, WBC WNL  Plan: Vancomycin 2g IV loading dose Then vancomycin 1g IV q12h continuing for 5 days Estimated AUC 505.7, goal AUC 400-550 Cefepime 2g IV q8h for 5 days total therapy Monitor clinical picture, renal function, vanc levels if needed pending LOT F/U C&S, abx de-escalation, LOT   Weight: 82.6 kg (182 lb 1.6 oz)  No data recorded.  No results for input(s): WBC, CREATININE, LATICACIDVEN, VANCOTROUGH, VANCOPEAK, VANCORANDOM, GENTTROUGH, GENTPEAK, GENTRANDOM, TOBRATROUGH, TOBRAPEAK, TOBRARND, AMIKACINPEAK, AMIKACINTROU, AMIKACIN in the last 168 hours.  Estimated Creatinine Clearance: 101 mL/min (A) (by C-G formula based on SCr of 0.54 mg/dL (L)).    Allergies  Allergen Reactions   Compazine [Prochlorperazine] Other (See Comments)    Dystonic rxn - convulsions. Near fatal reaction.    Ivp Dye [Iodinated Diagnostic Agents] Shortness Of Breath    Pt states SOB after last IV contrast injection   Alcohol-Sulfur [Elemental Sulfur] Other (See Comments)    History of alcoholism   Benadryl [Diphenhydramine] Other (See Comments)    "stuffy", nasal congestion   Depakote [Divalproex Sodium] Other (See Comments)    Cause elevated ammonia   Dramamine [Dimenhydrinate] Swelling   Plavix [Clopidogrel] Other (See Comments)    Rectal bleeding. "Perforated my intestines."   Rosuvastatin     Other reaction(s): Other (See Comments)    Antimicrobials this admission: Vanc 9/18> Cefepime 9/18> Azith 9/18>  Microbiology results: 9/18 RVP neg 9/18 BCx sent Previous encounter - 9/4 MRSA PCR neg - 9/4 BCx neg - 9/4 RVP neg   Harlow Mares, PharmD Clinical Pharmacist  04/28/2021   8:23 PM

## 2021-04-28 NOTE — Progress Notes (Signed)
RT Note: Et tube switch out due to leak. Bougie used. 7.5 et tube 24 at lip. Positive color change. Placed patient back on vent settings. NP-Rust-[place]ster, called to request tube be advanced to from 24 to 26. Patient tolerated interventions well.

## 2021-04-28 NOTE — Sepsis Progress Note (Signed)
Following per sepsis protocol   

## 2021-04-28 NOTE — ED Notes (Signed)
Pt return from CT.

## 2021-04-28 NOTE — ED Notes (Signed)
MD and Respiratory at bedside at this time.

## 2021-04-28 NOTE — ED Triage Notes (Signed)
Patient to ED via [facility] from [facility] for unresponsiveness. Patient was suppose to eat supper and staff could not get him to wake up. Unknown last well known.

## 2021-04-28 NOTE — Progress Notes (Signed)
CODE SEPSIS - PHARMACY COMMUNICATION  **Broad Spectrum Antibiotics should be administered within 1 hour of Sepsis diagnosis**  Time Code Sepsis Called/Page Received: 1836  Antibiotics Ordered: azithromycin, vancomycin, cefepime @ 1845  Time of 1st antibiotic administration: 1934; slight delay in med administration d/t shift change  Additional action taken by pharmacy: increased vancomycin loading dose  If necessary, Name of Provider/Nurse Contacted: Alphonzo Lemmings, RN    Doroteo Glassman ,PharmD Clinical Pharmacist  04/28/2021  7:21 PM

## 2021-04-28 NOTE — ED Notes (Signed)
Port CXR performed 

## 2021-04-28 NOTE — ED Notes (Signed)
MD at the bedside  

## 2021-04-28 NOTE — ED Provider Notes (Signed)
[facility] Emergency Department Provider Note    Event Date/Time   First MD Initiated Contact with Patient 04/28/21 1835     (approximate)  I have reviewed the triage vital signs and the nursing notes.   HISTORY  Chief Complaint AMS  Level V CaveaT:  AMS HPI Brandon [name] is a 65 y.o. male below listed past medical history presents to the ER unresponsive.  Uncertain as to last time seen normal.  Staff at [facility] called EMS the patient was thought to be napping but was found to be unresponsive was reportedly hypotensive in the 50s and unresponsive.  EMS started IV fluids..  Reportedly wears oxygen but is not certain as to how much.  Was continued on oxygen.  No additional interventions provided.  Blood pressure improving after IV fluids.  Now responding to sternal rub.  Still altered and not following commands.  Past Medical History:  Diagnosis Date   Anemia    IDA   Colon polyps    COPD (chronic obstructive pulmonary disease) (HCC)    Coronary artery disease    Heart attack (HCC) 2005   Hiatal hernia    History of ETOH abuse    Hyperlipidemia    Hypertension    Pleurisy    Schizophrenia (HCC)    Schizophrenia (HCC)    Seizures (HCC)    grand mal in 2000 or 2001 recovery from alcoholism   Suicide attempt [facility])    Family History  Problem Relation Age of Onset   Lung cancer Mother    Hypertension Father    Heart attack Father    CAD Father    Prostate cancer Neg Hx    Bladder Cancer Neg Hx    Kidney cancer Neg Hx    Past Surgical History:  Procedure Laterality Date   ABDOMINAL SURGERY     internal bleeding   CHOLECYSTECTOMY N/A 05/04/2018   Procedure: LAPAROSCOPIC CHOLECYSTECTOMY, converted to open;  Surgeon: Sung Amabile, DO;  Location: [facility] ORS;  Service: General;  Laterality: N/A;   COLONOSCOPY     COLONOSCOPY WITH PROPOFOL N/A 05/09/2016   Procedure: COLONOSCOPY WITH PROPOFOL;  Surgeon: Scot Jun, MD;  Location: [facility]  ENDOSCOPY;  Service: Endoscopy;  Laterality: N/A;   CORONARY ANGIOPLASTY WITH STENT PLACEMENT     1 vessel   ESOPHAGOGASTRODUODENOSCOPY (EGD) WITH PROPOFOL N/A 05/09/2016   Procedure: ESOPHAGOGASTRODUODENOSCOPY (EGD) WITH PROPOFOL;  Surgeon: Scot Jun, MD;  Location: [facility] ENDOSCOPY;  Service: Endoscopy;  Laterality: N/A;   EYE SURGERY Right    GLAUCOMA SURGERY     LAPAROSCOPIC APPENDECTOMY N/A 07/17/2018   Procedure: APPENDECTOMY LAPAROSCOPIC;  Surgeon: Sung Amabile, DO;  Location: [facility] ORS;  Service: General;  Laterality: N/A;   LEFT HEART CATH Right 10/19/2017   Procedure: Left Heart Cath and Coronary Angiography;  Surgeon: Laurier Nancy, MD;  Location: [facility] INVASIVE CV LAB;  Service: Cardiovascular;  Laterality: Right;   NOSE SURGERY     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   VASECTOMY     Patient Active Problem List   Diagnosis Date Noted   Acute metabolic encephalopathy 04/28/2021   Hypotension 04/28/2021   Acute respiratory failure (HCC) 04/28/2021   Shortness of breath 04/12/2021   Acute respiratory failure with hypoxia and hypercapnia (HCC)    Aspiration pneumonia of both lungs due to gastric secretions (HCC)    Lobar pneumonia, unspecified organism (HCC)    Vitamin D deficiency    Epigastric abdominal  pain    Recurrent falls    Hyponatremia 03/22/2021   BPH (benign prostatic hyperplasia) 03/22/2021   Weakness    Schizophrenia (HCC)    Acute hyponatremia 11/27/2020   Appendicitis with perforation 07/17/2018   Acute cholecystitis 04/10/2018   Chronic cholecystitis 04/08/2018   Atherosclerosis of native arteries of extremity with intermittent claudication (HCC) 11/22/2017   Essential hypertension 11/22/2017   Hyperlipidemia 11/22/2017   COPD (chronic obstructive pulmonary disease) (HCC) 11/22/2017   Coronary artery disease 10/15/2017   Other toe(s) amputation status 11/17/2014   Schizoaffective disorder, bipolar type (HCC) 08/23/2008      Prior to Admission  medications   Medication Sig Start Date End Date Taking? Authorizing Provider  acetaminophen (TYLENOL) 325 MG tablet Take 650 mg by mouth every 4 (four) hours as needed for mild pain or moderate pain.   Yes [provider]  atorvastatin (LIPITOR) 20 MG tablet Take 1 tablet (20 mg total) by mouth at bedtime. 04/11/21  Yes Wieting, Richard, MD  bisacodyl (DULCOLAX) 5 MG EC tablet Take 5 mg by mouth once.   Yes [provider]  budesonide-formoterol (SYMBICORT) 80-4.5 MCG/ACT inhaler Inhale 2 puffs into the lungs 2 (two) times daily.   Yes [provider]  buPROPion (WELLBUTRIN SR) 100 MG 12 hr tablet Take 100 mg by mouth daily.    Yes [provider]  carvedilol (COREG) 25 MG tablet Take 25 mg by mouth 2 (two) times daily with a meal.   Yes [provider]  cholecalciferol (VITAMIN D) 25 MCG tablet Take 1 tablet (1,000 Units total) by mouth daily. 03/26/21  Yes Enedina Finner, MD  cloZAPine (CLOZARIL) 100 MG tablet Take 150 mg by mouth at bedtime.   Yes [provider]  docusate sodium (COLACE) 100 MG capsule Take 1 capsule (100 mg total) by mouth 2 (two) times daily. Hold if has diarrhea 04/11/21  Yes Wieting, Richard, MD  dorzolamide-timolol (COSOPT) 22.3-6.8 MG/ML ophthalmic solution Place 1 drop into both eyes 2 (two) times daily.   Yes [provider]  ezetimibe (ZETIA) 10 MG tablet Take 10 mg by mouth at bedtime.    Yes [provider]  furosemide (LASIX) 20 MG tablet Take 0.5 tablets (10 mg total) by mouth daily. 04/12/21  Yes Wieting, Richard, MD  hydrOXYzine (VISTARIL) 25 MG capsule Take 1 capsule (25 mg total) by mouth 3 (three) times daily as needed for anxiety. 04/11/21  Yes Wieting, Richard, MD  isosorbide mononitrate (IMDUR) 30 MG 24 hr tablet Take 30 mg by mouth 2 (two) times daily.    Yes [provider]  latanoprost (XALATAN) 0.005 % ophthalmic solution Place 1 drop into both eyes at bedtime. 09/15/17  Yes [provider]  nitroGLYCERIN (NITROSTAT) 0.4 MG SL tablet Place 0.4 mg under the tongue every 5 (five) minutes as needed for chest pain.    Yes [provider]  olmesartan (BENICAR) 20 MG tablet Take 20 mg by mouth daily.   Yes [provider]  pantoprazole (PROTONIX) 40 MG tablet Take 40 mg by mouth 2 (two) times daily.   Yes [provider]  polyethylene glycol (MIRALAX / GLYCOLAX) 17 g packet Take 17 g by mouth daily. 04/17/21  Yes Enedina Finner, MD  senna (SENOKOT) 8.6 MG tablet Take 1-2 tablets by mouth daily as needed for constipation.   Yes [provider]  sodium chloride 1 g tablet Take 1 tablet (1 g total) by mouth 3 (three) times daily with meals. 03/25/21  Yes Enedina Finner, MD  tamsulosin (FLOMAX) 0.4 MG CAPS capsule Take 0.4 mg by mouth daily.    Yes [provider]  albuterol (VENTOLIN HFA) 108 (90 Base) MCG/ACT inhaler Inhale 2 puffs into the lungs every 6 (six) hours as needed for wheezing or shortness of breath. 04/11/21   Alford Highland, MD    Allergies Compazine [prochlorperazine], Ivp dye [iodinated diagnostic agents], Alcohol-sulfur [elemental sulfur], Benadryl [diphenhydramine], Depakote [divalproex sodium], Dramamine [dimenhydrinate], Plavix [clopidogrel], and Rosuvastatin    Social History Social History   Tobacco Use   Smoking status: Former    Packs/day: 1.00    Types: Cigarettes    Quit date: 2012    Years since quitting: 10.7   Smokeless tobacco: Former  Building services engineer Use: Never used  Substance Use Topics   Alcohol use: No    Comment: no alcohol since 2010   Drug use: No    Review of Systems Patient denies headaches, rhinorrhea, blurry vision, numbness, shortness of breath, chest pain, edema, cough, abdominal pain, nausea, vomiting, diarrhea, dysuria, fevers, rashes or hallucinations unless otherwise stated above in HPI. ____________________________________________   PHYSICAL EXAM:  VITAL  SIGNS: Vitals:   04/28/21 2015 04/28/21 2101  BP: 110/72   Pulse: 86   Resp: 19   SpO2: 90% 100%    Constitutional: GCS 6 (4,1,1) but improving.  Gag reflex intact. Eyes: Conjunctivae are normal.  Head: Atraumatic. Nose: No congestion/rhinnorhea. Mouth/Throat: Mucous membranes are moist.   Neck: No stridor. Painless ROM.  Cardiovascular: Normal rate, regular rhythm. Grossly normal heart sounds.  Good peripheral circulation. Respiratory: Normal respiratory effort.  No retractions. Lungs CTAB. Gastrointestinal: Soft and nontender. No distention. No abdominal bruits. No CVA tenderness. Genitourinary:  Musculoskeletal: No lower extremity tenderness nor edema.  No joint effusions. Neurologic:  GCS6, withdraws to pain, gag reflex present, not follow commands Skin:  Skin is warm, dry and intact. No rash noted. Psychiatric: Mood and affect are normal. Speech and behavior are normal.  ____________________________________________   LABS (all labs ordered are listed, but only abnormal results are displayed)  Results for orders placed or performed during the hospital encounter of 04/28/21 (from the past 24 hour(s))  Blood gas, venous     Status: Abnormal   Collection Time: 04/28/21  6:37 PM  Result Value Ref Range   pH, Ven 7.26 7.250 - 7.430   pCO2, Ven 65 (H) 44.0 - 60.0 mmHg   pO2, Ven 33.0 32.0 - 45.0 mmHg   Bicarbonate 29.2 (H) 20.0 - 28.0 mmol/L   Acid-Base Excess 0.9 0.0 - 2.0 mmol/L   O2 Saturation 52.9 %   Patient temperature 37.0    Collection site LINE    Sample type VENOUS   Resp Panel by RT-PCR (Flu A&B, Covid) Nasopharyngeal Swab     Status: None   Collection Time: 04/28/21  7:05 PM   Specimen: Nasopharyngeal Swab; Nasopharyngeal(NP) swabs in vial transport medium  Result Value Ref Range   SARS Coronavirus 2 by RT PCR NEGATIVE NEGATIVE   Influenza A by PCR NEGATIVE NEGATIVE   Influenza B by PCR NEGATIVE NEGATIVE  Lactic acid, plasma     Status: None   Collection  Time: 04/28/21  7:05 PM  Result Value Ref Range   Lactic Acid, Venous 1.0 0.5 - 1.9 mmol/L  Comprehensive metabolic panel     Status: Abnormal   Collection Time: 04/28/21  7:05 PM  Result Value Ref Range   Sodium 122 (L) 135 - 145 mmol/L  Potassium 4.5 3.5 - 5.1 mmol/L   Chloride 86 (L) 98 - 111 mmol/L   CO2 28 22 - 32 mmol/L   Glucose, Bld 129 (H) 70 - 99 mg/dL   BUN 7 (L) 8 - 23 mg/dL   Creatinine, Ser 1.61 0.61 - 1.24 mg/dL   Calcium 8.4 (L) 8.9 - 10.3 mg/dL   Total Protein 5.3 (L) 6.5 - 8.1 g/dL   Albumin 2.9 (L) 3.5 - 5.0 g/dL   AST 16 15 - 41 U/L   ALT 13 0 - 44 U/L   Alkaline Phosphatase 78 38 - 126 U/L   Total Bilirubin 0.7 0.3 - 1.2 mg/dL   GFR, Estimated >09 >60 mL/min   Anion gap 8 5 - 15  CBC WITH DIFFERENTIAL     Status: Abnormal   Collection Time: 04/28/21  7:05 PM  Result Value Ref Range   WBC 9.6 4.0 - 10.5 K/uL   RBC 3.70 (L) 4.22 - 5.81 MIL/uL   Hemoglobin 10.9 (L) 13.0 - 17.0 g/dL   HCT 45.4 (L) 09.8 - 11.9 %   MCV 87.8 80.0 - 100.0 fL   MCH 29.5 26.0 - 34.0 pg   MCHC 33.5 30.0 - 36.0 g/dL   RDW 14.7 82.9 - 56.2 %   Platelets 266 150 - 400 K/uL   nRBC 0.0 0.0 - 0.2 %   Neutrophils Relative % 79 %   Neutro Abs 7.6 1.7 - 7.7 K/uL   Lymphocytes Relative 9 %   Lymphs Abs 0.9 0.7 - 4.0 K/uL   Monocytes Relative 9 %   Monocytes Absolute 0.9 0.1 - 1.0 K/uL   Eosinophils Relative 1 %   Eosinophils Absolute 0.1 0.0 - 0.5 K/uL   Basophils Relative 0 %   Basophils Absolute 0.0 0.0 - 0.1 K/uL   Immature Granulocytes 2 %   Abs Immature Granulocytes 0.17 (H) 0.00 - 0.07 K/uL  Protime-INR     Status: None   Collection Time: 04/28/21  7:05 PM  Result Value Ref Range   Prothrombin Time 12.0 11.4 - 15.2 seconds   INR 0.9 0.8 - 1.2  APTT     Status: Abnormal   Collection Time: 04/28/21  7:05 PM  Result Value Ref Range   aPTT 22 (L) 24 - 36 seconds   ____________________________________________  EKG My review and personal interpretation at Time: 18:38    Indication: ams  Rate: 80  Rhythm: sinus Axis: normal Other: no stemi or depressions ____________________________________________  RADIOLOGY  I personally reviewed all radiographic images ordered to evaluate for the above acute complaints and reviewed radiology reports and findings.  These findings were personally discussed with the patient.  Please see medical record for radiology report.  ____________________________________________   PROCEDURES  Procedure(s) performed:  .Critical Care Performed by: Willy Eddy, MD Authorized by: Willy Eddy, MD   Critical care provider statement:    Critical care time (minutes):  35   Critical care time was exclusive of:  Separately billable procedures and treating other patients   Critical care was necessary to treat or prevent imminent or life-threatening deterioration of the following conditions:  CNS failure or compromise and renal failure   Critical care was time spent personally by me on the following activities:  Development of treatment plan with patient or surrogate, discussions with consultants, evaluation of patient's response to treatment, examination of patient, obtaining history from patient or surrogate, ordering and performing treatments and interventions, ordering and review of laboratory studies, ordering and review  of radiographic studies, pulse oximetry, re-evaluation of patient's condition and review of old charts Procedure Name: Intubation Date/Time: 04/28/2021 8:58 PM Performed by: Willy Eddy, MD Pre-anesthesia Checklist: Patient identified, Emergency Drugs available, Suction available and Patient being monitored Preoxygenation: Pre-oxygenation with 100% oxygen Induction Type: IV induction and Rapid sequence Ventilation: Mask ventilation without difficulty Laryngoscope Size: Glidescope Grade View: Grade III Tube size: 7.5 mm Number of attempts: 1 Airway Equipment and Method: Video-laryngoscopy Placement  Confirmation: ETT inserted through vocal cords under direct vision, CO2 detector, Breath sounds checked- equal and bilateral and Positive ETCO2 Secured at: 23 cm Dental Injury: Teeth and Oropharynx as per pre-operative assessment        Critical Care performed: yes ____________________________________________   INITIAL IMPRESSION / ASSESSMENT AND PLAN / ED COURSE  Pertinent labs & imaging results that were available during my care of the patient were reviewed by me and considered in my medical decision making (see chart for details).   DDX: Dehydration, sepsis, pna, uti, hypoglycemia, cva, drug effect, withdrawal, encephalitis   Brandon [name] is a 65 y.o. who presents to the ED with altered mental status as described above.  Patient with similar presentation over the past few months several times found to be hyponatremic dehydrated.  Patient is hypotensive given IV fluids blood pressures are improving.  Does not appear to be having seizure.  Pupils are reactive.  He is not responding to sternal rub.  We will continue to closely observe he seems to be protecting his airway.  The patient will be placed on continuous pulse oximetry and telemetry for monitoring.  Laboratory evaluation will be sent to evaluate for the above complaints.     Clinical Course as of 04/28/21 2115  Wynelle Link Apr 28, 2021  5176 Patient becoming more alert after continued IV fluids.  We will continue to observe we will hold off on intubation at this time. [PR]  2019 Blood pressure significantly improved with hyponatremic once again.  Lactate normal.  PCO2 only mildly elevated, not high enough to explain his encephalopathy which is overall improving with IV hydration.  Will discuss with hospitalist for admission. [PR]  2051 Went to reassess patient was found to be hypoxic to the 70s.  Of his significant decline.  Patient too confused to safely tolerate BiPAP therefore was intubated.  Discussed with hospitalist. [PR]     Clinical Course User Index [PR] Willy Eddy, MD    The patient was evaluated in Emergency Department today for the symptoms described in the history of present illness. He/she was evaluated in the context of the global COVID-19 pandemic, which necessitated consideration that the patient might be at risk for infection with the SARS-CoV-2 virus that causes COVID-19. Institutional protocols and algorithms that pertain to the evaluation of patients at risk for COVID-19 are in a state of rapid change based on information released by regulatory bodies including the CDC and federal and state organizations. These policies and algorithms were followed during the patient's care in the ED.  As part of my medical decision making, I reviewed the following data within the electronic MEDICAL RECORD NUMBER Nursing notes reviewed and incorporated, Labs reviewed, notes from prior ED visits and [place] Controlled Substance Database   ____________________________________________   FINAL CLINICAL IMPRESSION(S) / ED DIAGNOSES  Final diagnoses:  Unresponsiveness  Hyponatremia  Acute respiratory failure with hypoxia (HCC)      NEW MEDICATIONS STARTED DURING THIS VISIT:  New Prescriptions   No medications on file  Note:  This document was prepared using Dragon voice recognition software and may include unintentional dictation errors.    Willy Eddy, MD 04/28/21 2115

## 2021-04-28 NOTE — Progress Notes (Signed)
PHARMACY CONSULT NOTE - FOLLOW UP  Pharmacy Consult for Electrolyte Monitoring and Replacement   Recent Labs: Potassium (mmol/L)  Date Value  04/28/2021 4.5   Magnesium (mg/dL)  Date Value  64/15/8309 1.8   Calcium (mg/dL)  Date Value  40/76/8088 8.4 (L)   Albumin (g/dL)  Date Value  06/13/1593 2.9 (L)   Phosphorus (mg/dL)  Date Value  58/59/2924 3.5   Sodium (mmol/L)  Date Value  04/28/2021 122 (L)     Assessment: 9/18:  Ca = 8.4,  Albumin = 2.9           Corrected Ca   Goal of Therapy:  Electrolytes WNL   Plan:  No additional Ca needed at this time.   Na is low, will contact MD for possible hypertonic NaCl.   Scherrie Gerlach ,PharmD Clinical Pharmacist 04/28/2021 11:02 PM

## 2021-04-28 NOTE — H&P (Addendum)
NAME:  [name], MRN:  [id number], DOB:  [birth year], LOS: 0 ADMISSION DATE:  04/28/2021, CONSULTATION DATE:  04/28/21 REFERRING MD:  Dr. Roxan Hockey, CHIEF COMPLAINT: Altered mental status  History of Present Illness:  65 year old male presenting to [facility] ED on 04/28/2021 from SN[facility] via EMS.  Staff at [facility] called EMS after patient was found to be unresponsive and reportedly hypotensive in the 50s for an unknown amount of time.  Staff had stated earlier they thought the patient had been napping.  EMS started IV fluids and continued chronic oxygen in route, with reports of blood pressure improvement.  Per ED documentation upon arrival patient responsive to sternal rub, altered and not following commands. ED course: Patient initially appeared to improve with IV fluids and was protecting his airway.  However within 2 hours after arrival patient became hypoxic with SPO2 in the 70s and was too confused to safely tolerate BiPAP requiring emergent intubation and mechanical ventilatory support.  Of note this is the patient's sixth hospital admission in 2022 for a combination of hyponatremia/hypoxia with or without unresponsiveness.  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 Initial vitals: Afebrile at 96.4, tachypneic at 26, NSR at 79, BP marginal at 95/69 with SPO2 99% on 4 L nasal cannula. Significant labs: Labs/ Imaging personally reviewed I, Cheryll Cockayne Rust-Chester, AGACNP-BC, personally viewed and interpreted this ECG. EKG Interpretation Date: 04/28/2021 EKG Time: 1838 Rate: 78 Rhythm: NSR QRS Axis: Normal Intervals: Borderline criteria for LVH ST/T Wave abnormalities: None Narrative Interpretation: Sinus rhythm Hyponatremia Na+: 122, K+: 4.5, Cl: 86, BUN/Cr.:  7/1.15, Serum CO2/ AG: 28/8 Hgb: 10.9, WBC: 9.6, Lactic/ PCT: 1/pending VBG: 7.26/65/33/29.2  CXR 04/28/2021 (before intubation): Linear atelectasis or  fibrosis in the left midlung and right lung base no focal consolidation. CT head without contrast & CT cervical spine without contrast 04/28/2021: No acute intracranial abnormalities, mild cerebral atrophy.  Diffuse degenerative change in the cervical spine no acute displaced fractures.  Air-fluid level in the left maxillary antrum may indicate sinusitis  Pertinent  Medical History  COPD CAD Hypertension Hyperlipidemia Schizoaffective disorder bipolar 1 Seizures EtOH abuse MI-2005 Suicide attempt Significant Hospital Events: Including procedures, antibiotic start and stop dates in addition to other pertinent events   04/28/2021-patient found unresponsive in SNF and emergently intubated upon arrival to ED, found to be hypoxic and hyponatremic admitted to ICU requiring mechanical ventilation.  Interim History / Subjective:  Patient intubated and sedated on Precedex, unable to follow commands.  Bedside nursing reports multiple aspiration events, and copious secretions.  Objective   Blood pressure 110/72, pulse 86, resp. rate 19, weight 85.2 kg, SpO2 100 %.    Vent Mode: AC Set Rate:  [18 bmp] 18 bmp Vt Set:  [475 mL] 475 mL PEEP:  [5 cmH20] 5 cmH20   Intake/Output Summary (Last 24 hours) at 04/28/2021 2119 Last data filed at 04/28/2021 2051 Gross per 24 hour  Intake 1249 ml  Output --  Net 1249 ml   Filed Weights   04/28/21 1901 04/28/21 2057  Weight: 82.6 kg 85.2 kg    Examination: General: Adult male, critically ill, lying in bed intubated & sedated requiring mechanical ventilation, NAD HEENT: MM pink/moist, anicteric, atraumatic, neck supple Neuro: Sedated unable to follow commands, PERRL +3  CV: s1s2 RRR, NSR on monitor, no r/m/g Pulm: Regular, non labored on PRVC with FiO2 50% and PEEP of 5, breath sounds coarse rhonchi, with mild expiratory wheezing-BUL & diminished-BLL  GI: soft, rounded, bs x 4 GU: foley in place with clear yellow urine Skin: Limited exam, mild  scattered ecchymosis,  no rashes/lesions noted Extremities: warm/dry, pulses + 2 R/P, no edema noted  Resolved Hospital Problem list     Assessment & Plan:  Acute on chronic combined hypoxic & Hypercapnic Respiratory Failure secondary to suspected aspiration in the setting of acute encephalopathy Suspected Aspiration Pneumonia PMHx: COPD Patient received a dose of azithromycin, cefepime & vancomycin.  With multiple recent admissions and primary residence SNF, will continue previously ordered cefepime and vancomycin to include Pseudomonas coverage but will add Flagyl to cover aspiration event - Ventilator settings: PRVC  8 mL/kg, 50% FiO2, 5 PEEP, continue ventilator support & lung protective strategies - Wean PEEP & FiO2 as tolerated, maintain SpO2 > 90% - Head of bed elevated 30 degrees, VAP protocol in place - Plateau pressures less than 30 cm H20  - Intermittent chest x-ray & ABG PRN - Daily WUA with SBT as tolerated  - Ensure adequate pulmonary hygiene  - F/u cultures, trend PCT - Continue CAP/Aspiration Pna coverage cefepime, vancomycin & Flagyl - Steroids initiated: solu-medrol 40 mg BID  - Budesonide nebs BID, patient audibly wheezing > scheduled DuoNebs every 6 h & bronchodilators PRN - PAD protocol in place: continue Fentanyl IVP & Precedex drip  Acute on chronic hyponatremia secondary to suspected hypovolemia/dehydration in the setting of 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 CTH/Cervical Spine negative for abnormality - supportive care as above - avoid sedating medications as tolerated, outpatient medications on hold, consider restarting as patient stabilizes  Best Practice (right click and "Reselect all SmartList Selections" daily)  Diet/type: NPO w/ meds via tube DVT prophylaxis: LMWH GI prophylaxis: PPI Lines: N/A Foley:  Yes, and it is still needed Code Status:  full code Last date of multidisciplinary goals of care discussion [attempted to call Brother, Casimiro Needle, but left a voicemail]  Labs   CBC: Recent Labs  Lab 04/28/21 1905  WBC 9.6  NEUTROABS 7.6  HGB 10.9*  HCT 32.5*  MCV 87.8  PLT 266    Basic Metabolic Panel: Recent Labs  Lab 04/28/21 1905  NA 122*  K 4.5  CL 86*  CO2 28  GLUCOSE 129*  BUN 7*  CREATININE 1.15  CALCIUM 8.4*   GFR: Estimated Creatinine Clearance: 70.3 mL/min (by C-G formula based on SCr of 1.15 mg/dL). Recent Labs  Lab 04/28/21 1905  WBC 9.6  LATICACIDVEN 1.0    Liver Function Tests: Recent Labs  Lab 04/28/21 1905  AST 16  ALT 13  ALKPHOS 78  BILITOT 0.7  PROT 5.3*  ALBUMIN 2.9*   No results for input(s): LIPASE, AMYLASE in the last 168 hours. No results for input(s): AMMONIA in the last 168 hours.  ABG    Component Value Date/Time   PHART 7.42 04/13/2021 0928   PCO2ART 52 (H) 04/13/2021 0928   PO2ART 70 (L) 04/13/2021 0928   HCO3 29.2 (H) 04/28/2021 1837   O2SAT 52.9  04/28/2021 1837     Coagulation Profile: Recent Labs  Lab 04/28/21 1905  INR 0.9    Cardiac Enzymes: No results for input(s): CKTOTAL, CKMB, CKMBINDEX, TROPONINI in the last 168 hours.  HbA1C: Hemoglobin A1C  Date/Time Value Ref Range Status  09/11/2014 03:25 PM 6.3 4.2 - 6.3 % Final    Comment:    The American Diabetes Association recommends that a primary goal of therapy should be <7% and that physicians should reevaluate the treatment regimen in patients with HbA1c values  consistently >8%.    Hgb A1c MFr Bld  Date/Time Value Ref Range Status  11/28/2020 05:53 AM 6.3 (H) 4.8 - 5.6 % Final    Comment:    (NOTE) Pre diabetes:          5.7%-6.4%  Diabetes:              >6.4%  Glycemic control for   <7.0% adults with diabetes     CBG: No results for input(s): GLUCAP in the last 168 hours.  Review of Systems:   UTA- intubated & sedated, unable to interview at this time  Past Medical History:  He,  has a past medical history of Anemia, Colon polyps, COPD (chronic obstructive pulmonary disease) (HCC), Coronary artery disease, Heart attack (HCC) (2005), Hiatal hernia, History of ETOH abuse, Hyperlipidemia, Hypertension, Pleurisy, Schizophrenia (HCC), Schizophrenia (HCC), Seizures (HCC), and Suicide attempt (HCC).   Surgical History:   Past Surgical History:  Procedure Laterality Date   ABDOMINAL SURGERY     internal bleeding   CHOLECYSTECTOMY N/A 05/04/2018   Procedure: LAPAROSCOPIC CHOLECYSTECTOMY, converted to open;  Surgeon: Sung Amabile, DO;  Location: [facility] ORS;  Service: General;  Laterality: N/A;   COLONOSCOPY     COLONOSCOPY WITH PROPOFOL N/A 05/09/2016   Procedure: COLONOSCOPY WITH PROPOFOL;  Surgeon: Scot Jun, MD;  Location: [facility] ENDOSCOPY;  Service: Endoscopy;  Laterality: N/A;   CORONARY ANGIOPLASTY WITH STENT PLACEMENT     1 vessel   ESOPHAGOGASTRODUODENOSCOPY (EGD) WITH PROPOFOL N/A 05/09/2016   Procedure: ESOPHAGOGASTRODUODENOSCOPY (EGD) WITH PROPOFOL;  Surgeon: Scot Jun, MD;  Location: [facility] ENDOSCOPY;  Service: Endoscopy;  Laterality: N/A;   EYE SURGERY Right    GLAUCOMA SURGERY     LAPAROSCOPIC APPENDECTOMY N/A 07/17/2018   Procedure: APPENDECTOMY LAPAROSCOPIC;  Surgeon: Sung Amabile, DO;  Location: [facility] ORS;  Service: General;  Laterality: N/A;   LEFT HEART CATH Right 10/19/2017   Procedure: Left Heart Cath and Coronary Angiography;  Surgeon: Laurier Nancy, MD;  Location: [facility] INVASIVE CV LAB;  Service:  Cardiovascular;  Laterality: Right;   NOSE SURGERY     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 His smoking use included cigarettes. He smoked an average of 1 pack per day. He has quit using smokeless tobacco. He reports that he does not drink alcohol and does not use drugs.   Family History:  His family history includes CAD in his father; Heart attack in his father; Hypertension in his father; Lung cancer in his mother. There is no history of Prostate cancer, Bladder Cancer, or Kidney cancer.   Allergies Allergies  Allergen Reactions   Compazine [Prochlorperazine] Other (See Comments)    Dystonic rxn - convulsions. Near fatal reaction.    Ivp Dye [Iodinated Diagnostic Agents] Shortness Of Breath    Pt states SOB after last IV contrast injection  Alcohol-Sulfur [Elemental Sulfur] Other (See Comments)    History of alcoholism   Benadryl [Diphenhydramine] Other (See Comments)    "stuffy", nasal congestion   Depakote [Divalproex Sodium] Other (See Comments)    Cause elevated ammonia   Dramamine [Dimenhydrinate] Swelling   Plavix [Clopidogrel] Other (See Comments)    Rectal bleeding. "Perforated my intestines."   Rosuvastatin     Other reaction(s): Other (See Comments)     Home Medications  Prior to Admission medications   Medication Sig Start Date End Date Taking? Authorizing Provider  acetaminophen (TYLENOL) 325 MG tablet Take 650 mg by mouth every 4 (four) hours as needed for mild pain or moderate pain.   Yes [provider]  atorvastatin (LIPITOR) 20 MG tablet Take 1 tablet (20 mg total) by mouth at bedtime. 04/11/21  Yes Wieting, Richard, MD  bisacodyl (DULCOLAX) 5 MG EC tablet Take 5 mg by mouth once.   Yes [provider]  budesonide-formoterol (SYMBICORT) 80-4.5 MCG/ACT inhaler Inhale 2 puffs into the lungs 2 (two) times daily.   Yes [provider]  buPROPion (WELLBUTRIN  SR) 100 MG 12 hr tablet Take 100 mg by mouth daily.    Yes [provider]  carvedilol (COREG) 25 MG tablet Take 25 mg by mouth 2 (two) times daily with a meal.   Yes [provider]  cholecalciferol (VITAMIN D) 25 MCG tablet Take 1 tablet (1,000 Units total) by mouth daily. 03/26/21  Yes Enedina Finner, MD  cloZAPine (CLOZARIL) 100 MG tablet Take 150 mg by mouth at bedtime.   Yes [provider]  docusate sodium (COLACE) 100 MG capsule Take 1 capsule (100 mg total) by mouth 2 (two) times daily. Hold if has diarrhea 04/11/21  Yes Wieting, Richard, MD  dorzolamide-timolol (COSOPT) 22.3-6.8 MG/ML ophthalmic solution Place 1 drop into both eyes 2 (two) times daily.   Yes [provider]  ezetimibe (ZETIA) 10 MG tablet Take 10 mg by mouth at bedtime.    Yes [provider]  furosemide (LASIX) 20 MG tablet Take 0.5 tablets (10 mg total) by mouth daily. 04/12/21  Yes Wieting, Richard, MD  hydrOXYzine (VISTARIL) 25 MG capsule Take 1 capsule (25 mg total) by mouth 3 (three) times daily as needed for anxiety. 04/11/21  Yes Wieting, Richard, MD  isosorbide mononitrate (IMDUR) 30 MG 24 hr tablet Take 30 mg by mouth 2 (two) times daily.    Yes [provider]  latanoprost (XALATAN) 0.005 % ophthalmic solution Place 1 drop into both eyes at bedtime. 09/15/17  Yes [provider]  nitroGLYCERIN (NITROSTAT) 0.4 MG SL tablet Place 0.4 mg under the tongue every 5 (five) minutes as needed for chest pain.    Yes [provider]  olmesartan (BENICAR) 20 MG tablet Take 20 mg by mouth daily.   Yes [provider]  pantoprazole (PROTONIX) 40 MG tablet Take 40 mg by mouth 2 (two) times daily.   Yes [provider]  polyethylene glycol (MIRALAX / GLYCOLAX) 17 g packet Take 17 g by mouth daily. 04/17/21  Yes Enedina Finner, MD  senna (SENOKOT) 8.6 MG tablet Take 1-2 tablets by mouth daily as needed for constipation.   Yes [provider]  sodium  chloride 1 g tablet Take 1 tablet (1 g total) by mouth 3 (three) times daily with meals. 03/25/21  Yes Enedina Finner, MD  tamsulosin (FLOMAX) 0.4 MG CAPS capsule Take 0.4 mg by mouth daily.    Yes [provider]  albuterol (  VENTOLIN HFA) 108 (90 Base) MCG/ACT inhaler Inhale 2 puffs into the lungs every 6 (six) hours as needed for wheezing or shortness of breath. 04/11/21   Alford Highland, MD     Critical care time: 60 minutes       Betsey Holiday, AGACNP-BC Acute Care Nurse Practitioner [facility]   [phone] / [phone] Please see Amion for pager details.

## 2021-04-29 ENCOUNTER — Inpatient Hospital Stay: Payer: Medicare Other

## 2021-04-29 DIAGNOSIS — J9602 Acute respiratory failure with hypercapnia: Secondary | ICD-10-CM | POA: Diagnosis not present

## 2021-04-29 DIAGNOSIS — J9601 Acute respiratory failure with hypoxia: Secondary | ICD-10-CM

## 2021-04-29 DIAGNOSIS — Z9911 Dependence on respirator [ventilator] status: Secondary | ICD-10-CM

## 2021-04-29 DIAGNOSIS — Z978 Presence of other specified devices: Secondary | ICD-10-CM

## 2021-04-29 LAB — CBC
HCT: 40.6 % (ref 39.0–52.0)
Hemoglobin: 13.2 g/dL (ref 13.0–17.0)
MCH: 28.2 pg (ref 26.0–34.0)
MCHC: 32.5 g/dL (ref 30.0–36.0)
MCV: 86.8 fL (ref 80.0–100.0)
Platelets: 303 10*3/uL (ref 150–400)
RBC: 4.68 MIL/uL (ref 4.22–5.81)
RDW: 13.8 % (ref 11.5–15.5)
WBC: 8.3 10*3/uL (ref 4.0–10.5)
nRBC: 0 % (ref 0.0–0.2)

## 2021-04-29 LAB — BASIC METABOLIC PANEL
Anion gap: 7 (ref 5–15)
BUN: 7 mg/dL — ABNORMAL LOW (ref 8–23)
CO2: 28 mmol/L (ref 22–32)
Calcium: 8.9 mg/dL (ref 8.9–10.3)
Chloride: 92 mmol/L — ABNORMAL LOW (ref 98–111)
Creatinine, Ser: 0.67 mg/dL (ref 0.61–1.24)
GFR, Estimated: >60 mL/min (ref >60)
Glucose, Bld: 115 mg/dL — ABNORMAL HIGH (ref 70–99)
Potassium: 3.9 mmol/L (ref 3.5–5.1)
Sodium: 127 mmol/L — ABNORMAL LOW (ref 135–145)

## 2021-04-29 LAB — TSH: TSH: 0.975 u[IU]/mL (ref 0.350–4.500)

## 2021-04-29 LAB — URINE CULTURE: Culture: NO GROWTH

## 2021-04-29 LAB — CORTISOL-AM, BLOOD: Cortisol - AM: 9.5 ug/dL (ref 6.7–22.6)

## 2021-04-29 LAB — MRSA NEXT GEN BY PCR, NASAL: MRSA by PCR Next Gen: NOT DETECTED

## 2021-04-29 LAB — SODIUM, URINE, RANDOM
Sodium, Ur: 38 mmol/L
Sodium, Ur: 38 mmol/L

## 2021-04-29 LAB — GLUCOSE, CAPILLARY
Glucose-Capillary: 100 mg/dL — ABNORMAL HIGH (ref 70–99)
Glucose-Capillary: 103 mg/dL — ABNORMAL HIGH (ref 70–99)
Glucose-Capillary: 107 mg/dL — ABNORMAL HIGH (ref 70–99)
Glucose-Capillary: 113 mg/dL — ABNORMAL HIGH (ref 70–99)
Glucose-Capillary: 199 mg/dL — ABNORMAL HIGH (ref 70–99)

## 2021-04-29 LAB — SODIUM
Sodium: 122 mmol/L — ABNORMAL LOW (ref 135–145)
Sodium: 129 mmol/L — ABNORMAL LOW (ref 135–145)

## 2021-04-29 LAB — PROCALCITONIN
Procalcitonin: <0.1 ng/mL
Procalcitonin: <0.1 ng/mL

## 2021-04-29 LAB — PHOSPHORUS: Phosphorus: 3.4 mg/dL (ref 2.5–4.6)

## 2021-04-29 LAB — OSMOLALITY: Osmolality: 256 mOsm/kg — ABNORMAL LOW (ref 275–295)

## 2021-04-29 LAB — MAGNESIUM: Magnesium: 1.9 mg/dL (ref 1.7–2.4)

## 2021-04-29 LAB — OSMOLALITY, URINE
Osmolality, Ur: 206 mOsm/kg — ABNORMAL LOW (ref 300–900)
Osmolality, Ur: 206 mOsm/kg — ABNORMAL LOW (ref 300–900)

## 2021-04-29 MED ORDER — SODIUM CHLORIDE 0.9 % IV SOLN
3.0000 g | Freq: Four times a day (QID) | INTRAVENOUS | Status: DC
  Administered 2021-04-29 (×2): 3 g via INTRAVENOUS
  Filled 2021-04-29: qty 3
  Filled 2021-04-29 (×3): qty 8

## 2021-04-29 MED ORDER — SODIUM CHLORIDE 0.9 % IV BOLUS
1000.0000 mL | Freq: Once | INTRAVENOUS | Status: AC
  Administered 2021-04-29: 1000 mL via INTRAVENOUS

## 2021-04-29 MED ORDER — CLOZAPINE 100 MG PO TABS
100.0000 mg | ORAL_TABLET | Freq: Every day | ORAL | Status: DC
  Administered 2021-04-29: 100 mg via ORAL
  Filled 2021-04-29 (×2): qty 1

## 2021-04-29 MED ORDER — DORZOLAMIDE HCL-TIMOLOL MAL 2-0.5 % OP SOLN
1.0000 [drp] | Freq: Two times a day (BID) | OPHTHALMIC | Status: DC
  Administered 2021-04-29 – 2021-05-16 (×35): 1 [drp] via OPHTHALMIC
  Filled 2021-04-29: qty 10

## 2021-04-29 MED ORDER — MOMETASONE FURO-FORMOTEROL FUM 100-5 MCG/ACT IN AERO
2.0000 | INHALATION_SPRAY | Freq: Two times a day (BID) | RESPIRATORY_TRACT | Status: DC
  Administered 2021-04-29 – 2021-04-30 (×2): 2 via RESPIRATORY_TRACT
  Filled 2021-04-29 (×2): qty 8.8

## 2021-04-29 MED ORDER — PREDNISONE 10 MG PO TABS
40.0000 mg | ORAL_TABLET | Freq: Every day | ORAL | Status: DC
  Administered 2021-04-30: 40 mg via ORAL
  Filled 2021-04-29: qty 4

## 2021-04-29 MED ORDER — SENNA 8.6 MG PO TABS
1.0000 | ORAL_TABLET | Freq: Every day | ORAL | Status: DC | PRN
Start: 2021-04-29 — End: 2021-05-18
  Administered 2021-05-13: 17.2 mg via ORAL
  Filled 2021-04-29: qty 2

## 2021-04-29 MED ORDER — VITAMIN D 25 MCG (1000 UNIT) PO TABS
1000.0000 [IU] | ORAL_TABLET | Freq: Every day | ORAL | Status: DC
  Administered 2021-04-29 – 2021-04-30 (×2): 1000 [IU] via ORAL
  Filled 2021-04-29 (×3): qty 1

## 2021-04-29 MED ORDER — CHLORHEXIDINE GLUCONATE CLOTH 2 % EX PADS: 6.0000 | MEDICATED_PAD | Freq: Every day | CUTANEOUS | Status: DC

## 2021-04-29 MED ORDER — CHLORHEXIDINE GLUCONATE CLOTH 2 % EX PADS
6.0000 | MEDICATED_PAD | Freq: Every day | CUTANEOUS | Status: DC
  Administered 2021-04-29 – 2021-05-15 (×16): 6 via TOPICAL

## 2021-04-29 MED ORDER — TAMSULOSIN HCL 0.4 MG PO CAPS
0.4000 mg | ORAL_CAPSULE | Freq: Every day | ORAL | Status: DC
  Administered 2021-04-29 – 2021-05-12 (×14): 0.4 mg via ORAL
  Filled 2021-04-29 (×14): qty 1

## 2021-04-29 MED ORDER — EZETIMIBE 10 MG PO TABS
10.0000 mg | ORAL_TABLET | Freq: Every day | ORAL | Status: DC
  Administered 2021-04-29: 10 mg via ORAL
  Filled 2021-04-29 (×2): qty 1

## 2021-04-29 MED ORDER — PANTOPRAZOLE SODIUM 40 MG PO TBEC
40.0000 mg | DELAYED_RELEASE_TABLET | Freq: Two times a day (BID) | ORAL | Status: DC
  Administered 2021-04-29 – 2021-04-30 (×4): 40 mg via ORAL
  Filled 2021-04-29 (×4): qty 1

## 2021-04-29 MED ORDER — HYDROXYZINE HCL 25 MG PO TABS
25.0000 mg | ORAL_TABLET | Freq: Three times a day (TID) | ORAL | Status: DC | PRN
  Administered 2021-05-01 – 2021-05-12 (×9): 25 mg via ORAL
  Filled 2021-04-29 (×13): qty 1

## 2021-04-29 MED ORDER — NITROGLYCERIN 0.4 MG SL SUBL: 0.4000 mg | SUBLINGUAL_TABLET | SUBLINGUAL | Status: DC | PRN

## 2021-04-29 MED ORDER — ALBUTEROL SULFATE (2.5 MG/3ML) 0.083% IN NEBU
3.0000 mL | INHALATION_SOLUTION | Freq: Four times a day (QID) | RESPIRATORY_TRACT | Status: DC | PRN
  Filled 2021-04-29: qty 3

## 2021-04-29 MED ORDER — CARVEDILOL 12.5 MG PO TABS: 25.0000 mg | ORAL_TABLET | Freq: Two times a day (BID) | ORAL | Status: DC

## 2021-04-29 MED ORDER — FUROSEMIDE 20 MG PO TABS: 10.0000 mg | ORAL_TABLET | Freq: Every day | ORAL | Status: DC

## 2021-04-29 MED ORDER — ATORVASTATIN CALCIUM 20 MG PO TABS
20.0000 mg | ORAL_TABLET | Freq: Every day | ORAL | Status: DC
  Administered 2021-04-29 – 2021-04-30 (×2): 20 mg via ORAL
  Filled 2021-04-29 (×2): qty 1

## 2021-04-29 MED ORDER — METHYLPREDNISOLONE SODIUM SUCC 40 MG IJ SOLR: 20.0000 mg | Freq: Two times a day (BID) | INTRAMUSCULAR | Status: DC

## 2021-04-29 MED ORDER — DOCUSATE SODIUM 100 MG PO CAPS
100.0000 mg | ORAL_CAPSULE | Freq: Two times a day (BID) | ORAL | Status: DC
  Administered 2021-04-29 – 2021-04-30 (×3): 100 mg via ORAL
  Filled 2021-04-29 (×3): qty 1

## 2021-04-29 MED ORDER — IPRATROPIUM-ALBUTEROL 0.5-2.5 (3) MG/3ML IN SOLN
3.0000 mL | RESPIRATORY_TRACT | Status: DC | PRN
  Administered 2021-04-29 – 2021-04-30 (×2): 3 mL via RESPIRATORY_TRACT
  Filled 2021-04-29: qty 3

## 2021-04-29 MED ORDER — LATANOPROST 0.005 % OP SOLN
1.0000 [drp] | Freq: Every day | OPHTHALMIC | Status: DC
  Administered 2021-04-29 – 2021-05-16 (×18): 1 [drp] via OPHTHALMIC
  Filled 2021-04-29: qty 2.5

## 2021-04-29 MED ORDER — BUPROPION HCL ER (SR) 100 MG PO TB12
100.0000 mg | ORAL_TABLET | Freq: Every day | ORAL | Status: DC
  Administered 2021-04-29 – 2021-05-17 (×19): 100 mg via ORAL
  Filled 2021-04-29 (×20): qty 1

## 2021-04-29 NOTE — Progress Notes (Signed)
PHARMACY CONSULT NOTE  Pharmacy Consult for Electrolyte Monitoring and Replacement   Recent Labs: Potassium (mmol/L)  Date Value  04/29/2021 3.9   Magnesium (mg/dL)  Date Value  02/72/5366 1.9   Calcium (mg/dL)  Date Value  44/10/4740 8.9   Albumin (g/dL)  Date Value  59/56/3875 2.9 (L)   Phosphorus (mg/dL)  Date Value  64/33/2951 3.4   Sodium (mmol/L)  Date Value  04/29/2021 127 (L)   Assessment: Patient is a 65 y/o M with medical history including COPD, CAD, HTN, HLD, schizoaffective disorder / bipolar 1, seizures, EtOH abuse, history of suicide attempt who is admitted with acute on chronic respiratory failure. Patient was briefly intubated but has now been extubated and is on nasal cannula. Pharmacy consulted to assist with electrolyte monitoring and replacement as indicated.  Scr 1.15 >> 0.67  Plan:  --Na 122 >> 127. Hypotonic hypovolemic hyponatremia on NS at 100 mL/hr --No electrolyte replacement warranted at this time --Will follow-up electrolytes with AM labs tomorrow  Tressie Ellis 04/29/2021 9:42 AM

## 2021-04-29 NOTE — Progress Notes (Signed)
Brother Casimiro Needle brought papers and filled out his portion needs patient signature.

## 2021-04-29 NOTE — Progress Notes (Addendum)
Acute on Chronic Hyponatremia Na+ remains at 122, serum osmolality 206, urine sodium < 40, urine osmolality > 100 Of note patient is alert and responsive on mechanical ventilation, following commands. Will continue to resuscitate with IVF - 1 L NS bolus ordered, increased rate of IVF  - recheck Na+ in 4 hours, target to increase Na+ no more than 6-8 meq in 24 hours   Addendum: Patient passed SBT, RSBI: 48, order placed to extubate patient. Patient resting comfortably on 4 L [place].    Cheryll Cockayne Rust-Chester, AGACNP-BC Acute Care Nurse Practitioner [facility]   [phone] / [phone] Please see Amion for pager details.

## 2021-04-29 NOTE — Consult Note (Signed)
[facility] [place], [place] 04/29/21  Subjective:   Hospital day # 1  Patient known to our practice from previous admissions This time he is brought to the emergency room for evaluation of unresponsiveness.  Apparently patient is resident at [facility].  Staff called EMS because patient had decreased responsiveness and blood pressure was in the 50s. According to ER notes, patient was given IV fluid resuscitation Nephrology consult has been requested for evaluation of hyponatremia Usual sodium level of 136 from April 16, 2021 Sodium was 122 when patient presented to the emergency room yesterday With IV fluids, his sodium as well as serum creatinine have improved This morning, he sitting up in the bed, trying to eat breakfast when seen  Patient required a brief intubation last night.  He was placed on ventilator but extubated early this morning.  Renal: 09/18 0701 - 09/19 0700 In: 3730.2 [I.V.:981.2; IV Piggyback:2749] Out: 1200 [Urine:1200] Lab Results  Component Value Date   CREATININE 0.67 04/29/2021   CREATININE 1.15 04/28/2021   CREATININE 0.54 (L) 04/16/2021     Objective:  Vital signs in last 24 hours:  Temp:  [95.6 F (35.3 C)-97.8 F (36.6 C)] 97.5 F (36.4 C) (09/19 0700) Pulse Rate:  [72-102] 90 (09/19 0600) Resp:  [18-26] 23 (09/19 0600) BP: (77-159)/(56-102) 147/99 (09/19 0600) SpO2:  [89 %-100 %] 91 % (09/19 0600) FiO2 (%):  [40 %-60 %] 40 % (09/19 0437) Weight:  [82.6 kg-86.4 kg] 86.4 kg (09/19 0213)  Weight change:  Filed Weights   04/28/21 1901 04/28/21 2057 04/29/21 0213  Weight: 82.6 kg 85.2 kg 86.4 kg    Intake/Output:    Intake/Output Summary (Last 24 hours) at 04/29/2021 0917 Last data filed at 04/29/2021 0733 Gross per 24 hour  Intake 3916.88 ml  Output 2450 ml  Net 1466.88 ml     Physical Exam: General: Sitting up in the bed  HEENT Moist oral mucous membranes  Pulm/lungs Normal breathing  effort, clear to auscultation  CVS/Heart Tachycardic, no rub  Abdomen:  Soft, nontender  Extremities: No edema  Neurologic: Lethargic but arousable, able to answer few simple questions,  Skin: Warm, dry          Basic Metabolic Panel:  Recent Labs  Lab 04/28/21 1905 04/29/21 0113 04/29/21 0536  NA 122* 122* 127*  K 4.5  --  3.9  CL 86*  --  92*  CO2 28  --  28  GLUCOSE 129*  --  115*  BUN 7*  --  7*  CREATININE 1.15  --  0.67  CALCIUM 8.4*  --  8.9  MG  --   --  1.9  PHOS  --   --  3.4     CBC: Recent Labs  Lab 04/28/21 1905 04/29/21 0536  WBC 9.6 8.3  NEUTROABS 7.6  --   HGB 10.9* 13.2  HCT 32.5* 40.6  MCV 87.8 86.8  PLT 266 303     No results found for: HEPBSAG, HEPBSAB, HEPBIGM    Microbiology:  Recent Results (from the past 240 hour(s))  Resp Panel by RT-PCR (Flu A&B, Covid) Nasopharyngeal Swab     Status: None   Collection Time: 04/28/21  7:05 PM   Specimen: Nasopharyngeal Swab; Nasopharyngeal(NP) swabs in vial transport medium  Result Value Ref Range Status   SARS Coronavirus 2 by RT PCR NEGATIVE NEGATIVE Final    Comment: (NOTE) SARS-CoV-2 target nucleic acids are NOT DETECTED.  The SARS-CoV-2 RNA is generally  detectable in upper respiratory specimens during the acute phase of infection. The lowest concentration of SARS-CoV-2 viral copies this assay can detect is 138 copies/mL. A negative result does not preclude SARS-Cov-2 infection and should not be used as the sole basis for treatment or other patient management decisions. A negative result may occur with  improper specimen collection/handling, submission of specimen other than nasopharyngeal swab, presence of viral mutation(s) within the areas targeted by this assay, and inadequate number of viral copies(<138 copies/mL). A negative result must be combined with clinical observations, patient history, and epidemiological information. The expected result is Negative.  Fact Sheet for  Patients:  BloggerCourse.com  Fact Sheet for Healthcare Providers:  SeriousBroker.it  This test is no t yet approved or cleared by the [place] FDA and  has been authorized for detection and/or diagnosis of SARS-CoV-2 by FDA under an Emergency Use Authorization (EUA). This EUA will remain  in effect (meaning this test can be used) for the duration of the COVID-19 declaration under Section 564(b)(1) of the Act, 21 U.S.C.section 360bbb-3(b)(1), unless the authorization is terminated  or revoked sooner.       Influenza A by PCR NEGATIVE NEGATIVE Final   Influenza B by PCR NEGATIVE NEGATIVE Final    Comment: (NOTE) The Xpert Xpress SARS-CoV-2/FLU/RSV plus assay is intended as an aid in the diagnosis of influenza from Nasopharyngeal swab specimens and should not be used as a sole basis for treatment. Nasal washings and aspirates are unacceptable for Xpert Xpress SARS-CoV-2/FLU/RSV testing.  Fact Sheet for Patients: BloggerCourse.com  Fact Sheet for Healthcare Providers: SeriousBroker.it  This test is not yet approved or cleared by the [place] FDA and has been authorized for detection and/or diagnosis of SARS-CoV-2 by FDA under an Emergency Use Authorization (EUA). This EUA will remain in effect (meaning this test can be used) for the duration of the COVID-19 declaration under Section 564(b)(1) of the Act, 21 U.S.C. section 360bbb-3(b)(1), unless the authorization is terminated or revoked.  Performed at [facility], [address] [place], [place] [address]   Blood Culture (routine x 2)     Status: None (Preliminary result)   Collection Time: 04/28/21  7:05 PM   Specimen: BLOOD  Result Value Ref Range Status   Specimen Description BLOOD  RT FOOT  Final   Special Requests BOTTLES DRAWN AEROBIC ONLY  Final   Culture   Final    NO GROWTH < 12  HOURS Performed at [facility], [address], [place], [place] [address]    Report Status PENDING  Incomplete  Blood Culture (routine x 2)     Status: None (Preliminary result)   Collection Time: 04/28/21  7:05 PM   Specimen: BLOOD  Result Value Ref Range Status   Specimen Description BLOOD  LFOA  Final   Special Requests   Final    BOTTLES DRAWN AEROBIC AND ANAEROBIC Blood Culture adequate volume   Culture   Final    NO GROWTH < 12 HOURS Performed at [facility], [address], [place], [place] [address]    Report Status PENDING  Incomplete  MRSA Next Gen by PCR, Nasal     Status: None   Collection Time: 04/29/21 12:55 AM   Specimen: Nasal Mucosa; Nasal Swab  Result Value Ref Range Status   MRSA by PCR Next Gen NOT DETECTED NOT DETECTED Final    Comment: (NOTE) The GeneXpert MRSA Assay (FDA approved for NASAL specimens only), is one component of  a comprehensive MRSA colonization surveillance program. It is not intended to diagnose MRSA infection nor to guide or monitor treatment for MRSA infections. Test performance is not FDA approved in patients less than 108 years old. Performed at [facility], [address] [place], [place] [address]     Coagulation Studies: Recent Labs    04/28/21 1905  LABPROT 12.0  INR 0.9    Urinalysis: Recent Labs    04/28/21 2217  COLORURINE YELLOW  LABSPEC 1.010  PHURINE 7.0  GLUCOSEU NEGATIVE  HGBUR NEGATIVE  BILIRUBINUR NEGATIVE  KETONESUR NEGATIVE  PROTEINUR NEGATIVE  NITRITE NEGATIVE  LEUKOCYTESUR NEGATIVE      Imaging: CT HEAD WO CONTRAST ( )  Result Date: 04/28/2021 CLINICAL DATA:  Mental status change of unknown cause. Unresponsiveness. EXAM: CT HEAD WITHOUT CONTRAST CT CERVICAL SPINE WITHOUT CONTRAST TECHNIQUE: Multidetector CT imaging of the head and cervical spine was performed following the standard protocol without intravenous contrast. Multiplanar CT image reconstructions of  the cervical spine were also generated. COMPARISON:  CT head 08/30/2009. CT neck 08/31/2005. MRI brain 04/05/2021 FINDINGS: CT HEAD FINDINGS Brain: No evidence of acute infarction, hemorrhage, hydrocephalus, extra-axial collection or mass lesion/mass effect. Mild cerebral atrophy. Vascular: Moderate intracranial arterial vascular calcifications. Skull: Calvarium appears intact. Sinuses/Orbits: Air-fluid level in the left maxillary antrum may indicate sinusitis. Mastoid air cells are clear. Other: None. CT CERVICAL SPINE FINDINGS Alignment: Reversal of the usual cervical lordosis. This is likely positional but could indicate muscle spasm. Skull base and vertebrae: Skull base appears intact. No vertebral compression deformities. No focal bone lesion or bone destruction. Degenerative changes in the temporomandibular joints. Multiple previous tooth extractions with periodontal lucencies consistent with periodontal disease. Soft tissues and spinal canal: No prevertebral fluid or swelling. No visible canal hematoma. Disc levels: Prominent degenerative changes with narrowed interspaces and endplate osteophyte formation throughout the cervical spine. Degenerative changes in the facet joints. Uncovertebral spurring at multiple levels. Upper chest: Lung apices are clear. Other: None. IMPRESSION: 1. No acute intracranial abnormalities.  Mild cerebral atrophy. 2. Nonspecific reversal of the usual cervical lordosis. Diffuse degenerative change in the cervical spine. No acute displaced fractures. 3. Air-fluid level in the left maxillary antrum may indicate sinusitis. Electronically Signed   By: Burman Nieves M.D.   On: 04/28/2021 19:34   CT Cervical Spine Wo Contrast  Result Date: 04/28/2021 CLINICAL DATA:  Mental status change of unknown cause. Unresponsiveness. EXAM: CT HEAD WITHOUT CONTRAST CT CERVICAL SPINE WITHOUT CONTRAST TECHNIQUE: Multidetector CT imaging of the head and cervical spine was performed following the  standard protocol without intravenous contrast. Multiplanar CT image reconstructions of the cervical spine were also generated. COMPARISON:  CT head 08/30/2009. CT neck 08/31/2005. MRI brain 04/05/2021 FINDINGS: CT HEAD FINDINGS Brain: No evidence of acute infarction, hemorrhage, hydrocephalus, extra-axial collection or mass lesion/mass effect. Mild cerebral atrophy. Vascular: Moderate intracranial arterial vascular calcifications. Skull: Calvarium appears intact. Sinuses/Orbits: Air-fluid level in the left maxillary antrum may indicate sinusitis. Mastoid air cells are clear. Other: None. CT CERVICAL SPINE FINDINGS Alignment: Reversal of the usual cervical lordosis. This is likely positional but could indicate muscle spasm. Skull base and vertebrae: Skull base appears intact. No vertebral compression deformities. No focal bone lesion or bone destruction. Degenerative changes in the temporomandibular joints. Multiple previous tooth extractions with periodontal lucencies consistent with periodontal disease. Soft tissues and spinal canal: No prevertebral fluid or swelling. No visible canal hematoma. Disc levels: Prominent degenerative changes with narrowed interspaces and endplate osteophyte formation throughout the cervical  spine. Degenerative changes in the facet joints. Uncovertebral spurring at multiple levels. Upper chest: Lung apices are clear. Other: None. IMPRESSION: 1. No acute intracranial abnormalities.  Mild cerebral atrophy. 2. Nonspecific reversal of the usual cervical lordosis. Diffuse degenerative change in the cervical spine. No acute displaced fractures. 3. Air-fluid level in the left maxillary antrum may indicate sinusitis. Electronically Signed   By: Burman Nieves M.D.   On: 04/28/2021 19:34   DG Chest Portable 1 View  Result Date: 04/28/2021 CLINICAL DATA:  Endotracheal tube placement EXAM: PORTABLE CHEST 1 VIEW COMPARISON:  04/28/2021 9:10 p.m. FINDINGS: Endotracheal tube approximately 5.4  cm above the carina. Enteric tube side port overlies the stomach, with tip off the inferior field of view. The heart size and mediastinal contours are within normal limits. No focal pulmonary opacity. No pleural effusion or pneumothorax. No acute osseous abnormality. IMPRESSION: Repositioning of the endotracheal tube tip, which is in the midthoracic trachea. Electronically Signed   By: Wiliam Ke M.D.   On: 04/28/2021 21:43   DG Chest Portable 1 View  Result Date: 04/28/2021 CLINICAL DATA:  Endotracheal tube placement EXAM: PORTABLE CHEST 1 VIEW COMPARISON:  04/28/2021 FINDINGS: Support Apparatus: --Endotracheal tube: Tip at the level of the clavicular heads. --Enteric tube:Tip and sideport project over the stomach. --Vascular catheter(s):None --Other: None Right basilar atelectasis. No focal airspace consolidation or pulmonary edema. IMPRESSION: Endotracheal tube tip at the level of the clavicular heads. Electronically Signed   By: Deatra Robinson M.D.   On: 04/28/2021 21:33   DG Chest Portable 1 View  Result Date: 04/28/2021 CLINICAL DATA:  Questionable sepsis. Evaluate for abnormality. Unresponsiveness. EXAM: PORTABLE CHEST 1 VIEW COMPARISON:  04/15/2021 FINDINGS: Shallow inspiration. Heart size and pulmonary vascularity are normal. Linear atelectasis or fibrosis in the left mid lung and right lung base. No focal consolidation. No pleural effusions. No pneumothorax. Calcification of the aorta. Degenerative changes in the spine and shoulders. IMPRESSION: Linear atelectasis or fibrosis in the left mid lung and right lung base. No focal consolidation. Electronically Signed   By: Burman Nieves M.D.   On: 04/28/2021 19:48   DG Abd Portable 1 View  Result Date: 04/28/2021 CLINICAL DATA:  Orogastric tube placement EXAM: PORTABLE ABDOMEN - 1 VIEW COMPARISON:  None. FINDINGS: Tip and side port of esophageal catheter project within the stomach. IMPRESSION: Esophageal catheter tip in the stomach.  Electronically Signed   By: Deatra Robinson M.D.   On: 04/28/2021 21:34     Medications:    sodium chloride 100 mL/hr at 04/29/21 0223   ceFEPime (MAXIPIME) IV 2 g (04/29/21 0533)   metronidazole 100 mL/hr at 04/29/21 0623   vancomycin 1,000 mg (04/29/21 0836)    budesonide (PULMICORT) nebulizer solution  0.25 mg Nebulization BID   Chlorhexidine Gluconate Cloth  6 each Topical Daily   dorzolamide-timolol  1 drop Both Eyes BID   enoxaparin (LOVENOX) injection  40 mg Subcutaneous Q24H   ipratropium-albuterol  3 mL Nebulization Q6H   methylPREDNISolone (SOLU-MEDROL) injection  40 mg Intravenous Q12H   pantoprazole (PROTONIX) IV  40 mg Intravenous QHS   docusate sodium, ipratropium-albuterol, polyethylene glycol  Assessment/ Plan:  65 y.o. male with   COPD, CAD, hypertension, hyperlipidemia, schizoaffective disorder bipolar 1, seizure, alcohol abuse, MI 2005, history of suicidal attempt in the past admitted on 04/28/2021 for Acute respiratory failure (HCC) [J96.00] Hyponatremia [E87.1] Acute respiratory failure with hypoxia (HCC) [J96.01] Unresponsiveness [R41.89] Acute metabolic encephalopathy [G93.41]   #Hyponatremia Given improvement with IV saline, and  that patient was on furosemide 10 mg daily as outpatient, raises concern about hyponatremia being caused by volume depletion Recommend to continue to monitor serum sodium closely while patient is on IV normal saline.  Goal for correction is about 8-10 milliequivalents per liter in 24-hour period.  Therefore goal of sodium of about 130-132 by midnight tonight Would also recommend to discontinue furosemide and not restart as an outpatient       LOS: 1 Brandon [name] 9/19/20229:17 AM  [facility] [place], [place] [phone]  Note: This note was prepared with Dragon dictation. Any transcription errors are unintentional

## 2021-04-29 NOTE — Progress Notes (Signed)
Spoke to brother Casimiro Needle regarding patient's desire to think more about the advance directives and may want to include his daughter that he put up for adoption.  At this time, patient still wants Casimiro Needle to be his spokesperson if he is unable to make decisions.  Also notified brother Casimiro Needle patient has an order to move out of the ICU to Med Surg but no bed available at this time.

## 2021-04-29 NOTE — Progress Notes (Signed)
Chaplain Maggie made visit at the bedside. Pt was awake and open to conversation although the volume of his voice was low. Space was made for listening. Pt is interested in receiving the sacrament of "Anointing of the Sick". Chaplain will call [facility] to request visitation from a [place].

## 2021-04-29 NOTE — Progress Notes (Signed)
Refusing to have his foley removed said that male catheters don't work and he doesn't want to pee on himself.  Explained infection risks with leaving it in and he says one more day is all he is asking for.  Told him I would notify doctor.

## 2021-04-29 NOTE — Evaluation (Signed)
Occupational Therapy Evaluation Patient Details Name: Brandon [name] MRN: [id number] DOB: 10-23-1955 Today's Date: 04/29/2021   History of Present Illness 65 year old male presenting to [facility] ED on 04/28/2021 from SN[facility] via EMS.  Staff at [facility] called EMS after patient was found to be unresponsive and reportedly hypotensive in the 50s for an unknown amount of time.  Staff had stated earlier they thought the patient had been napping. Patient initially appeared to improve with IV fluids and was protecting his airway.  However within 2 hours after arrival patient became hypoxic with SPO2 in the 70s and was too confused to safely tolerate BiPAP requiring emergent intubation and mechanical ventilatory support.  Of note this is the patient's sixth hospital admission in 2022 for a combination of hyponatremia/hypoxia with or without unresponsiveness.  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   Clinical Impression   Brandon [name] was seen for OT evaluation this date. Prior to hospital admission, pt was at [facility] following recent hospital admission. Pt lives alone at baseline. Pt presents to acute OT demonstrating impaired ADL performance and functional mobility 2/2 decreased activity tolerance, functional strength/ROM/balance deficits, and poor safety awareness. Pt currently requires MOD A for LB access seated EOC - pt reports "feet feel asleep." MAX A x2 + RW for simulated BSC t/f - pt unable to advance LLE for safe pivot without +2 assistance. SETUP + VCs for seated grooming tasks - pt reports visual deficits.  Pt would benefit from skilled OT to address noted impairments and functional limitations (see below for any additional details) in order to maximize safety and independence while minimizing falls risk and caregiver burden. Upon hospital discharge, recommend STR to maximize pt safety and return to PLOF.  Orthostatic Vitals   Sitting: BP 118/105, MAP 111 Supine following dizziness in standing: BP 112/73, MAP 85      Recommendations for follow up therapy are one component of a multi-disciplinary discharge planning process, led by the attending physician.  Recommendations may be updated based on patient status, additional functional criteria and insurance authorization.   Follow Up Recommendations  SNF    Equipment Recommendations  Other (comment) (defer to next venue of care)    Recommendations for Other Services       Precautions / Restrictions Precautions Precautions: Fall Restrictions Weight Bearing Restrictions: No      Mobility Bed Mobility Overal bed mobility: Needs Assistance Bed Mobility: Sit to Supine     Sit to supine: Mod assist   General bed mobility comments: max assist to transfer BLE to EOB, pt participates in uprighting trunk to sit EOB    Transfers Overall transfer level: Needs assistance Equipment used: Rolling walker (2 wheeled) Transfers: Sit to/from [employer] Sit to Stand: Max assist;+2 physical assistance Stand pivot transfers: Max assist;+2 physical assistance       General transfer comment: unable to move LLE effectively in sitting/standing    Balance Overall balance assessment: Needs assistance Sitting-balance support: Bilateral upper extremity supported;Feet supported Sitting balance-[name] Scale: Fair  Postural control: Posterior lean;Left lateral lean Standing balance support: Bilateral upper extremity supported Standing balance-[name] Scale: Poor Standing balance comment: BUE support on RW with +2 assist                           ADL either performed or assessed with clinical judgement   ADL Overall ADL's : Needs assistance/impaired  General ADL Comments: MOD A for LB access seated EOC - pt reports "feet feel asleep." MAX A x2 + RW for simulated BSC t/f - pt unable to  advance LLE for safe pivot without +2 assistance. SETUP + VCs for seated grooming tasks - pt reports visual deficits      Pertinent Vitals/Pain Pain Assessment: No/denies pain     Hand Dominance Right   Extremity/Trunk Assessment Upper Extremity Assessment Upper Extremity Assessment: Generalized weakness (R grip 3/5, L grip 4/5, 5 finger opposition intact)   Lower Extremity Assessment Lower Extremity Assessment: Generalized weakness   Cervical / Trunk Assessment Cervical / Trunk Assessment: Kyphotic   Communication Communication Communication: No difficulties (soft spoken)   Cognition Arousal/Alertness: Lethargic Behavior During Therapy: WFL for tasks assessed/performed Overall Cognitive Status: Impaired/Different from baseline Area of Impairment: Following commands;Safety/judgement;Awareness                       Following Commands: Follows one step commands with increased time Safety/Judgement: Decreased awareness of safety;Decreased awareness of deficits     General Comments: A&Ox4. Pleasant and agreeable throughout. Requires increased time/cues for safe t/f technique      Exercises Exercises: Other exercises General Exercises - Lower Extremity Long Arc Quad: AROM;Strengthening;Both;5 reps;Seated Other Exercises Other Exercises: Pt educated re: OT role, DME recs, d/c recs, falls rpevention Other Exercises: LBD, sit<>stand, SPT, sitting/standing balance/tolerance, sit>sup, grooming   Shoulder Instructions      Home Living Family/patient expects to be discharged to:: Skilled nursing facility                                 Additional Comments: previously lived alone, however has been in SNF      Prior Functioning/Environment Level of Independence: Needs assistance  Gait / Transfers Assistance Needed: Ambulatory with SPC in home, RW in community. Has been using RW at SNF              OT Problem List: Decreased strength;Decreased  activity tolerance;Impaired balance (sitting and/or standing);Cardiopulmonary status limiting activity      OT Treatment/Interventions: Self-care/ADL training;Therapeutic exercise;Energy conservation;DME and/or AE instruction;Therapeutic activities;Patient/family education;Balance training    OT Goals(Current goals can be found in the care plan section) Acute Rehab OT Goals Patient Stated Goal: get better OT Goal Formulation: With patient Time For Goal Achievement: 05/13/21 Potential to Achieve Goals: Good ADL Goals Pt Will Perform Grooming: with set-up;sitting Pt Will Perform Lower Body Dressing: with min assist;sit to/from stand (c LRAD PRN) Pt Will Transfer to Toilet: with min assist;stand pivot transfer;bedside commode (c LRAD PRN) Pt Will Perform Toileting - Clothing Manipulation and hygiene: with modified independence;sitting/lateral leans  OT Frequency: Min 1X/week   Barriers to D/C:            Co-evaluation              AM-PAC OT "6 Clicks" Daily Activity     Outcome Measure Help from another person eating meals?: A Little Help from another person taking care of personal grooming?: A Little Help from another person toileting, which includes using toliet, bedpan, or urinal?: A Lot Help from another person bathing (including washing, rinsing, drying)?: A Lot Help from another person to put on and taking off regular upper body clothing?: A Little Help from another person to put on and taking off regular lower body clothing?: A Lot 6 Click Score: 15   End of  Session Equipment Utilized During Treatment: Engineer, water Communication: Mobility status  Activity Tolerance: Patient tolerated treatment well;Patient limited by fatigue Patient left: in bed;with call bell/phone within reach  OT Visit Diagnosis: Unsteadiness on feet (R26.81)                Time: 1335-1350 OT Time Calculation (min): 15 min Charges:  OT General Charges $OT Visit: 1 Visit OT  Evaluation $OT Eval Moderate Complexity: 1 Mod  Kathie Dike, M.S. OTR/L  04/29/21, 4:34 PM  ascom [phone]

## 2021-04-29 NOTE — Progress Notes (Signed)
   04/29/21 1220  Clinical Encounter Type  Visited With Patient  Visit Type Initial;Spiritual support;Social support  Referral From Nurse  Consult/Referral To Chaplain   Chaplain responded to a OR from nurse regarding an AD. When Chaplain arrived most of the AD pamphlet was filled out, but chaplain did AD education with PT. PT decided he needed more time to think it over before having it completed; Chaplain will try to follow up.    Posey Boyer, M. Div.

## 2021-04-29 NOTE — TOC Initial Note (Signed)
Transition of Care [facility]) - Initial/Assessment Note    Patient Details  Name: Brandon [name] MRN: [id number] Date of Birth: 1955/10/28  [facility] CM/SW Contact:    Armstrong Cellar, RN Phone Number: 04/29/2021, 11:38 AM  Clinical Narrative:                 Reviewed case in rounds. Patient arrived from [facility] and plans to return at discharge.   LVMM for Tam[facility] @ [facility] updating patient should be ready for discharge back to [facility] in a couple of days.         Patient Goals and CMS Choice        Expected Discharge Plan and Services                                                Prior Living Arrangements/Services                       Activities of Daily Living      Permission Sought/Granted                  Emotional Assessment              Admission diagnosis:  Acute respiratory failure (HCC) [J96.00] Hyponatremia [E87.1] Acute respiratory failure with hypoxia (HCC) [J96.01] Unresponsiveness [R41.89] Acute metabolic encephalopathy [G93.41] Patient Active Problem List   Diagnosis Date Noted   Endotracheally intubated 04/29/2021   On mechanically assisted ventilation (HCC) 04/29/2021   Acute metabolic encephalopathy 04/28/2021   Hypotension 04/28/2021   Acute respiratory failure (HCC) 04/28/2021   Shortness of breath 04/12/2021   Acute respiratory failure with hypoxia and hypercapnia (HCC)    Aspiration pneumonia of both lungs due to gastric secretions (HCC)    Lobar pneumonia, unspecified organism (HCC)    Vitamin D deficiency    Epigastric abdominal pain    Recurrent falls    Hyponatremia 03/22/2021   BPH (benign prostatic hyperplasia) 03/22/2021   Weakness    Schizophrenia (HCC)    Acute hyponatremia 11/27/2020   Appendicitis with perforation 07/17/2018   Acute cholecystitis 04/10/2018   Chronic cholecystitis 04/08/2018   Atherosclerosis of native arteries of extremity with intermittent claudication  (HCC) 11/22/2017   Essential hypertension 11/22/2017   Hyperlipidemia 11/22/2017   COPD (chronic obstructive pulmonary disease) (HCC) 11/22/2017   Coronary artery disease 10/15/2017   Other toe(s) amputation status 11/17/2014   Schizoaffective disorder, bipolar type (HCC) 08/23/2008   PCP:  Sherron Monday, MD Pharmacy:   [facility] - [place], [place] - [address] [place] [address] Phone: [phone] Fax: [phone]     Social Determinants of Health (SDOH) Interventions    Readmission Risk Interventions Readmission Risk Prevention Plan 04/17/2021 04/09/2021 03/24/2021  Transportation Screening Complete Complete Complete  PCP or Specialist Appt within 5-7 Days - - Complete  Home Care Screening - - Complete  Medication Review (RN CM) - - Complete  Medication Review (RN Care Manager) - Complete -  PCP or Specialist appointment within 3-5 days of discharge Complete Complete -  HRI or Home Care Consult Complete Complete -  SW Recovery Care/Counseling Consult Complete Complete -  Palliative Care Screening Not Applicable Not Applicable -  Skilled Nursing Facility Complete Complete -  Some recent data might be hidden

## 2021-04-29 NOTE — Progress Notes (Signed)
eLink Physician-Brief Progress Note Patient Name: IRL BODIE DOB: 05/31/1956 MRN: [id number]   Date of Service  04/29/2021  HPI/Events of Note  Brief HPI: 65 yr old male with hx of CAD, COPD, Schizophrenia, HTN, HLD found hypotensive, unresponsive-low GCS.   Admitted to ICU for  Encephalopathy  AHRF-Pneumonia on Ventilator, mostly aspiration.  Hyponatremia- mostly dehydration/sepsis.   Notes, labs reviewed. EKG: no acute ST T changes. 7.37/46/65/26 UA neg Tox screen: + for tricyclics.  Hyponatremic at 122, Cr 1.15, AG 8, Urine sodium < 40. Urine osmolality 206. LFT normal, alb 2.9 LA 1, pro cal low. Wbc normal, Hg 10.9 Covid/flu neg CTH/CT spine: no acute changes.  CxR film seen: ET in place.  Rt basal air space-could be aspiration. NG tip not seen, going into stomach area.  Camera: In synchrony with vent: PIP 23 VT < 8 ml/ibw/12/27/58%. On precedex gtt. Not on pressors. HR 80, MAP > 70, sats 100%.    eICU Interventions  - on empirical vanc/cefepime/flagyl, pending cultures. Trend LA. If going up, to consider CT abd. - Lung protective ventilation. SAT/SBT weaning daily in AM.  - watch for qtc prolongation, Tox + for tca. - follow sodium level closely, not to increase over 6 to 8 meq in first 24 hrs, follow serum osmolality/. Get TSH if not done. - keep MAP > 65 - CBG goals < 180 - VTE: Lovenox. -      Intervention Category Major Interventions: Respiratory failure - evaluation and management Evaluation Type: New Patient Evaluation  Ranee Gosselin 04/29/2021, 12:55 AM

## 2021-04-29 NOTE — Progress Notes (Signed)
NAME:  Brandon [name], MRN:  [id number], DOB:  [birth year], LOS: 1 ADMISSION DATE:  04/28/2021, CONSULTATION DATE:  04/28/21 REFERRING MD:  Dr. Roxan Hockey, CHIEF COMPLAINT: Altered mental status  History of Present Illness:  65 year old male presenting to [facility] ED on 04/28/2021 from [facility] via EMS.  Staff at [facility] called EMS after patient was found to be unresponsive and reportedly hypotensive in the 50s for an unknown amount of time.  Staff had stated earlier they thought the patient had been napping.  EMS started IV fluids and continued chronic oxygen in route, with reports of blood pressure improvement.  Per ED documentation upon arrival patient responsive to sternal rub, altered and not following commands. ED course: Patient initially appeared to improve with IV fluids and was protecting his airway.  However within 2 hours after arrival patient became hypoxic with SPO2 in the 70s and was too confused to safely tolerate BiPAP requiring emergent intubation and mechanical ventilatory support.  Of note this is the patient's sixth hospital admission in 2022 for a combination of hyponatremia/hypoxia with or without unresponsiveness.  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 Initial vitals: Afebrile at 96.4, tachypneic at 26, NSR at 79, BP marginal at 95/69 with SPO2 99% on 4 L nasal cannula.  Significant labs: Labs/ Imaging personally reviewed CXR 04/28/2021 (before intubation): Linear atelectasis or fibrosis in the left midlung and right lung base no focal consolidation. CT head without contrast & CT cervical spine without contrast 04/28/2021: No acute intracranial abnormalities, mild cerebral atrophy.  Diffuse degenerative change in the cervical spine no acute displaced fractures.  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 04/29/2021-Pt successfully extubated to 4L O2 via nasal canula   Interim History / Subjective:  Pt extubated to 4L O2 via nasal canula this am and tolerating well.  No complaints at this time   Objective   Blood pressure (!) 147/99, pulse 90, temperature (!) 97.5 F (36.4 C), temperature source Oral, resp. rate (!) 23, weight 86.4 kg, SpO2 91 %.    Vent Mode: Spontaneous FiO2 (%):  [40 %-60 %] 40 % Set Rate:  [18 bmp] 18 bmp Vt Set:  [475 mL-480 mL] 480 mL PEEP:  [5 cmH20] 5 cmH20 Pressure Support:  [12 cmH20] 12 cmH20   Intake/Output Summary (Last 24 hours) at 04/29/2021 0929 Last data filed at 04/29/2021 0932 Gross per 24 hour  Intake 3916.88 ml  Output 2450 ml  Net 1466.88 ml   Filed Weights   04/28/21 1901 04/28/21 2057 04/29/21 3557  Weight: 82.6 kg 85.2 kg 86.4 kg    Examination: General: Well developed male, resting in bed, NAD on 3L O2 via nasal canula  HEENT: Supple, no JVD  Neuro: Alert and oriented, follows commands, PERRLA CV: NSR, s1s2, no M/R/G, 2+ radial/1+ distal pulses no edema  Pulm: Clear throughout, even, non labored  GI: +BS x4, soft, non distended, non tender GU: Foley in place with clear yellow urine Skin: Limited exam, mild scattered ecchymosis, no rashes/lesions noted Extremities: Normal bulk and tone  Resolved Hospital Problem list  Acute encephalopathy   Assessment & Plan:  Acute on chronic combined hypoxic & Hypercapnic Respiratory Failure secondary to suspected aspiration in the setting of acute encephalopathy~improving   Suspected Aspiration Pneumonia PMHx: COPD Patient received a dose of azithromycin, cefepime & vancomycin.  With multiple recent admissions and primary residence SNF, will continue previously ordered cefepime and vancomycin to include Pseudomonas coverage but will add Flagyl to cover aspiration event - Supplemental O2 for dyspnea and/or hypoxia  - Continue scheduled and prn bronchodilator therapy  - Will change iv solumedrol to prednisone x5 days   HTN - Continuous telemetry monitoring  - Will resume outpatient atorvastatin and zetia - Hold outpatient antihypertensives due to soft bp's  - Will obtain orthostatic vital signs x1 due to possible syncopal episode if positive will consult Cardiology   Acute on chronic hyponatremia secondary to suspected hypovolemia/dehydration in the setting of 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 CTH/Cervical Spine negative for abnormality - Supportive care as above - Will consult psychiatry to determine appropriate dose of clozaril, which may be contributing to acute encephalopathy/suspected syncopal episode  - Continue outpatient wellbutrin sr and prn vistaril  - Frequent reorientation  - PT/OT consult   Best Practice (right click and "Reselect all SmartList Selections" daily)  Diet/type: Regular Diet  DVT prophylaxis: LMWH GI prophylaxis: PPI Lines: N/A Foley:  Yes, will discontinue 04/29/2021 Code Status:   full code Last date of multidisciplinary goals of care discussion [04/29/2021]  Updated pts brother Marinus Eicher at bedside 09/19 regarding current plan of care.  He states his brother does have a wife, however she currently resides at a SNF and he does not have her contact info.  He does state his brother has informed him he would like him to be his HCPOA in the event the pt is unable to make decisions for himself.  Pts RN has consulted [facility] to assist the pt and pts brother Imraan Wendell with completing the POA paperwork.   Labs   CBC: Recent Labs  Lab 04/28/21 1905 04/29/21 0536  WBC 9.6 8.3  NEUTROABS 7.6  --   HGB 10.9* 13.2  HCT 32.5* 40.6  MCV 87.8 86.8  PLT 266 303    Basic Metabolic Panel: Recent Labs  Lab 04/28/21 1905 04/29/21 0113 04/29/21 0536  NA 122* 122* 127*  K 4.5  --  3.9  CL 86*  --  92*  CO2 28  --  28  GLUCOSE 129*  --  115*  BUN 7*  --  7*  CREATININE 1.15  --  0.67  CALCIUM 8.4*  --  8.9  MG  --   --  1.9  PHOS  --   --  3.4   GFR: Estimated Creatinine Clearance: 101 mL/min (by C-G formula based on SCr of 0.67 mg/dL). Recent Labs  Lab 04/28/21 1905 04/29/21 0217 04/29/21 0536  PROCALCITON  --  <0.10 <0.10  WBC 9.6  --  8.3  LATICACIDVEN 1.0  --   --  Liver Function Tests: Recent Labs  Lab 04/28/21 1905  AST 16  ALT 13  ALKPHOS 78  BILITOT 0.7  PROT 5.3*  ALBUMIN 2.9*   No results for input(s): LIPASE, AMYLASE in the last 168 hours. No results for input(s): AMMONIA in the last 168 hours.  ABG    Component Value Date/Time   PHART 7.37 04/28/2021 2144   PCO2ART 46 04/28/2021 2144   PO2ART 65 (L) 04/28/2021 2144   HCO3 26.6 04/28/2021 2144   O2SAT 91.7 04/28/2021 2144     Coagulation Profile: Recent Labs  Lab 04/28/21 1905  INR 0.9    Cardiac Enzymes: No results for input(s): CKTOTAL, CKMB, CKMBINDEX, TROPONINI in the last 168 hours.  HbA1C: Hemoglobin A1C  Date/Time Value Ref Range Status   09/11/2014 03:25 PM 6.3 4.2 - 6.3 % Final    Comment:    The American Diabetes Association recommends that a primary goal of therapy should be <7% and that physicians should reevaluate the treatment regimen in patients with HbA1c values consistently >8%.    Hgb A1c MFr Bld  Date/Time Value Ref Range Status  11/28/2020 05:53 AM 6.3 (H) 4.8 - 5.6 % Final    Comment:    (NOTE) Pre diabetes:          5.7%-6.4%  Diabetes:              >6.4%  Glycemic control for   <7.0% adults with diabetes     CBG: Recent Labs  Lab 04/29/21 0052 04/29/21 0724  GLUCAP 103* 113*    Review of Systems:   UTA- intubated & sedated, unable to interview at this time  Past Medical History:  He,  has a past medical history of Anemia, Colon polyps, COPD (chronic obstructive pulmonary disease) (HCC), Coronary artery disease, Heart attack (HCC) (2005), Hiatal hernia, History of ETOH abuse, Hyperlipidemia, Hypertension, Pleurisy, Schizophrenia (HCC), Schizophrenia (HCC), Seizures (HCC), and Suicide attempt (HCC).   Surgical History:   Past Surgical History:  Procedure Laterality Date   ABDOMINAL SURGERY     internal bleeding   CHOLECYSTECTOMY N/A 05/04/2018   Procedure: LAPAROSCOPIC CHOLECYSTECTOMY, converted to open;  Surgeon: Sung Amabile, DO;  Location: [facility] ORS;  Service: General;  Laterality: N/A;   COLONOSCOPY     COLONOSCOPY WITH PROPOFOL N/A 05/09/2016   Procedure: COLONOSCOPY WITH PROPOFOL;  Surgeon: Scot Jun, MD;  Location: [facility] ENDOSCOPY;  Service: Endoscopy;  Laterality: N/A;   CORONARY ANGIOPLASTY WITH STENT PLACEMENT     1 vessel   ESOPHAGOGASTRODUODENOSCOPY (EGD) WITH PROPOFOL N/A 05/09/2016   Procedure: ESOPHAGOGASTRODUODENOSCOPY (EGD) WITH PROPOFOL;  Surgeon: Scot Jun, MD;  Location: [facility] ENDOSCOPY;  Service: Endoscopy;  Laterality: N/A;   EYE SURGERY Right    GLAUCOMA SURGERY     LAPAROSCOPIC APPENDECTOMY N/A 07/17/2018   Procedure: APPENDECTOMY LAPAROSCOPIC;   Surgeon: Sung Amabile, DO;  Location: [facility] ORS;  Service: General;  Laterality: N/A;   LEFT HEART CATH Right 10/19/2017   Procedure: Left Heart Cath and Coronary Angiography;  Surgeon: Laurier Nancy, MD;  Location: [facility] INVASIVE CV LAB;  Service: Cardiovascular;  Laterality: Right;   NOSE SURGERY     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 His smoking use included cigarettes. He smoked an average of 1 pack per day. He has quit using smokeless tobacco. He reports that he does not drink alcohol  and does not use drugs.   Family History:  His family history includes CAD in his father; Heart attack in his father; Hypertension in his father; Lung cancer in his mother. There is no history of Prostate cancer, Bladder Cancer, or Kidney cancer.   Allergies Allergies  Allergen Reactions   Compazine [Prochlorperazine] Other (See Comments)    Dystonic rxn - convulsions. Near fatal reaction.    Ivp Dye [Iodinated Diagnostic Agents] Shortness Of Breath    Pt states SOB after last IV contrast injection   Alcohol-Sulfur [Elemental Sulfur] Other (See Comments)    History of alcoholism   Benadryl [Diphenhydramine] Other (See Comments)    "stuffy", nasal congestion   Depakote [Divalproex Sodium] Other (See Comments)    Cause elevated ammonia   Dramamine [Dimenhydrinate] Swelling   Plavix [Clopidogrel] Other (See Comments)    Rectal bleeding. "Perforated my intestines."   Rosuvastatin     Other reaction(s): Other (See Comments)     Home Medications  Prior to Admission medications   Medication Sig Start Date End Date Taking? Authorizing Provider  acetaminophen (TYLENOL) 325 MG tablet Take 650 mg by mouth every 4 (four) hours as needed for mild pain or moderate pain.   Yes [provider]  atorvastatin (LIPITOR) 20 MG tablet Take 1 tablet (20 mg total) by mouth at bedtime. 04/11/21  Yes Wieting, Richard, MD   bisacodyl (DULCOLAX) 5 MG EC tablet Take 5 mg by mouth once.   Yes [provider]  budesonide-formoterol (SYMBICORT) 80-4.5 MCG/ACT inhaler Inhale 2 puffs into the lungs 2 (two) times daily.   Yes [provider]  buPROPion (WELLBUTRIN SR) 100 MG 12 hr tablet Take 100 mg by mouth daily.    Yes [provider]  carvedilol (COREG) 25 MG tablet Take 25 mg by mouth 2 (two) times daily with a meal.   Yes [provider]  cholecalciferol (VITAMIN D) 25 MCG tablet Take 1 tablet (1,000 Units total) by mouth daily. 03/26/21  Yes Enedina Finner, MD  cloZAPine (CLOZARIL) 100 MG tablet Take 150 mg by mouth at bedtime.   Yes [provider]  docusate sodium (COLACE) 100 MG capsule Take 1 capsule (100 mg total) by mouth 2 (two) times daily. Hold if has diarrhea 04/11/21  Yes Wieting, Richard, MD  dorzolamide-timolol (COSOPT) 22.3-6.8 MG/ML ophthalmic solution Place 1 drop into both eyes 2 (two) times daily.   Yes [provider]  ezetimibe (ZETIA) 10 MG tablet Take 10 mg by mouth at bedtime.    Yes [provider]  furosemide (LASIX) 20 MG tablet Take 0.5 tablets (10 mg total) by mouth daily. 04/12/21  Yes Wieting, Richard, MD  hydrOXYzine (VISTARIL) 25 MG capsule Take 1 capsule (25 mg total) by mouth 3 (three) times daily as needed for anxiety. 04/11/21  Yes Wieting, Richard, MD  isosorbide mononitrate (IMDUR) 30 MG 24 hr tablet Take 30 mg by mouth 2 (two) times daily.    Yes [provider]  latanoprost (XALATAN) 0.005 % ophthalmic solution Place 1 drop into both eyes at bedtime. 09/15/17  Yes [provider]  nitroGLYCERIN (NITROSTAT) 0.4 MG SL tablet Place 0.4 mg under the tongue every 5 (five) minutes as needed for chest pain.    Yes [provider]  olmesartan (BENICAR) 20 MG tablet Take 20 mg by mouth daily.   Yes [provider]  pantoprazole (PROTONIX) 40 MG tablet Take 40 mg by mouth 2 (two) times daily.   Yes  [provider]  polyethylene glycol (MIRALAX / GLYCOLAX) 17 g packet Take 17 g by mouth daily. 04/17/21  Yes Enedina Finner, MD  senna (SENOKOT) 8.6 MG tablet Take 1-2 tablets by mouth daily as needed for constipation.   Yes [provider]  sodium chloride 1 g tablet Take 1 tablet (1 g total) by mouth 3 (three) times daily with meals. 03/25/21  Yes Enedina Finner, MD  tamsulosin (FLOMAX) 0.4 MG CAPS capsule Take 0.4 mg by mouth daily.    Yes [provider]  albuterol (VENTOLIN HFA) 108 (90 Base) MCG/ACT inhaler Inhale 2 puffs into the lungs every 6 (six) hours as needed for wheezing or shortness of breath. 04/11/21   Alford Highland, MD     Critical care time: 40 minutes     Camie Patience, AGNP  Pulmonary/Critical Care Pager [phone] (please enter 7 digits) PCCM Consult Pager [phone] (please enter 7 digits)

## 2021-04-29 NOTE — Evaluation (Signed)
Physical Therapy Evaluation Patient Details Name: Brandon [name] MRN: [id number] DOB: November 03, 1955 Today's Date: 04/29/2021  History of Present Illness  65 year old male presenting to [facility] ED on 04/28/2021 from SN[facility] via EMS.  Staff at [facility] called EMS after patient was found to be unresponsive and reportedly hypotensive in the 50s for an unknown amount of time.  Staff had stated earlier they thought the patient had been napping. Patient initially appeared to improve with IV fluids and was protecting his airway.  However within 2 hours after arrival patient became hypoxic with SPO2 in the 70s and was too confused to safely tolerate BiPAP requiring emergent intubation and mechanical ventilatory support.  Of note this is the patient's sixth hospital admission in 2022 for a combination of hyponatremia/hypoxia with or without unresponsiveness.  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  Clinical Impression  Pt seen for PT evaluation with pt agreeable to tx. Pt requires max assist for bed mobility as pt notes BLE "feel asleep" & he states this multiple times during session. Pt sits EOB ~10 minutes engaging in BLE strengthening exercises & balance activity. Pt requires +2 for sit>stand & stand pivot to recliner with RW with pt demonstrating impaired safety (lets go of RW before completing pivot portion of transfer). Pt appreciative of session. Pt would benefit from STR upon d/c to maximize independence with functional mobility & reduce fall risk prior to return home.      Recommendations for follow up therapy are one component of a multi-disciplinary discharge planning process, led by the attending physician.  Recommendations may be updated based on patient status, additional functional criteria and insurance authorization.  Follow Up Recommendations SNF;Supervision/Assistance - 24 hour    Equipment Recommendations  None  recommended by PT    Recommendations for Other Services       Precautions / Restrictions Precautions Precautions: Fall Restrictions Weight Bearing Restrictions: No      Mobility  Bed Mobility Overal bed mobility: Needs Assistance Bed Mobility: Supine to Sit     Supine to sit: Max assist;HOB elevated     General bed mobility comments: max assist to transfer BLE to EOB, pt participates in uprighting trunk to sit EOB    Transfers Overall transfer level: Needs assistance Equipment used: Rolling walker (2 wheeled) Transfers: Sit to/from [employer] Sit to Stand: Max assist;+2 physical assistance;From elevated surface Stand pivot transfers: Max assist;+2 physical assistance;From elevated surface       General transfer comment: cuing for hand placement but pt lets go of RW & reaches for recliner armrest before completing pivot to seat  Ambulation/Gait                Stairs            Wheelchair Mobility    Modified [name] (Stroke Patients Only)       Balance Overall balance assessment: Needs assistance Sitting-balance support: Bilateral upper extremity supported;Feet supported Sitting balance-[name] Scale: Poor Sitting balance - Comments: cuing for anterior weight shift & midline orientation; pt engages in reaching to R to attempt to correct posture/midline balance (pt does undershoot reaching for PT's hand during activity & requires mod assist to facilitate movement) Postural control: Posterior lean;Left lateral lean   Standing balance-[name] Scale: Zero Standing balance comment: BUE support on RW with +2 assist  Pertinent Vitals/Pain Pain Assessment: No/denies pain    Home Living Family/patient expects to be discharged to:: Skilled nursing facility                 Additional Comments: previously lived alone, however has been in SNF    Prior Function Level of Independence: Needs  assistance   Gait / Transfers Assistance Needed: Ambulatory with SPC in home, RW in community. Has been using RW at [facility]           Hand Dominance        Extremity/Trunk Assessment   Upper Extremity Assessment Upper Extremity Assessment: Generalized weakness    Lower Extremity Assessment Lower Extremity Assessment: Generalized weakness (2/5 BLE knee extension in sitting)    Cervical / Trunk Assessment Cervical / Trunk Assessment: Kyphotic  Communication   Communication: No difficulties  Cognition Arousal/Alertness: Lethargic Behavior During Therapy: WFL for tasks assessed/performed Overall Cognitive Status: Within Functional Limits for tasks assessed                                 General Comments: A&Ox4. Pleasant and agreeable throughout      General Comments General comments (skin integrity, edema, etc.): SpO2 >90% on 2.5L/min, BP 114/85 mmHg (MAP 95) sitting in recliner at end of session    Exercises General Exercises - Lower Extremity Long Arc Quad: AROM;Strengthening;Both;5 reps;Seated   Assessment/Plan    PT Assessment Patient needs continued PT services  PT Problem List Decreased strength;Decreased mobility;Decreased balance;Decreased activity tolerance;Decreased safety awareness;Cardiopulmonary status limiting activity;Decreased cognition;Decreased knowledge of use of DME       PT Treatment Interventions DME instruction;Therapeutic activities;Modalities;Gait training;Therapeutic exercise;Patient/family education;Stair training;Balance training;Functional mobility training;Manual techniques;Neuromuscular re-education;Cognitive remediation    PT Goals (Current goals can be found in the Care Plan section)  Acute Rehab PT Goals Patient Stated Goal: get better PT Goal Formulation: With patient Time For Goal Achievement: 05/13/21 Potential to Achieve Goals: Fair    Frequency Min 2X/week   Barriers to discharge Decreased caregiver  support;Inaccessible home environment lives alone, stairs to enter home    Co-evaluation               AM-PAC PT "6 Clicks" Mobility  Outcome Measure Help needed turning from your back to your side while in a flat bed without using bedrails?: A Lot Help needed moving from lying on your back to sitting on the side of a flat bed without using bedrails?: Total Help needed moving to and from a bed to a chair (including a wheelchair)?: Total Help needed standing up from a chair using your arms (e.g., wheelchair or bedside chair)?: Total Help needed to walk in hospital room?: Total Help needed climbing 3-5 steps with a railing? : Total 6 Click Score: 7    End of Session Equipment Utilized During Treatment: Oxygen;Gait belt Activity Tolerance: Patient limited by fatigue Patient left: in chair;with call bell/phone within reach Nurse Communication: Mobility status (O2) PT Visit Diagnosis: Unsteadiness on feet (R26.81);Muscle weakness (generalized) (M62.81);Difficulty in walking, not elsewhere classified (R26.2)    Time: 2423-5361 PT Time Calculation (min) (ACUTE ONLY): 25 min   Charges:   PT Evaluation $PT Eval High Complexity: 1 High PT Treatments $Therapeutic Activity: 8-22 mins        Aleda Grana, PT, DPT 04/29/21, 1:15 PM   Sandi Mariscal 04/29/2021, 1:12 PM

## 2021-04-29 NOTE — Consult Note (Signed)
[name]   Reason for Consult: Consult for 65 year old man with a history of schizoaffective disorder who is in the hospital with hyponatremia and syncope.  Question about clozapine dosing. Referring Physician:  Francine Graven Patient Identification: Brandon [name] MRN:  [id number] Principal Diagnosis: Schizoaffective disorder, bipolar type (HCC) Diagnosis:  Principal Problem:   Schizoaffective disorder, bipolar type (HCC) Active Problems:   Coronary artery disease   Essential hypertension   COPD (chronic obstructive pulmonary disease) (HCC)   Acute hyponatremia   Schizophrenia (HCC)   BPH (benign prostatic hyperplasia)   Acute respiratory failure with hypoxia and hypercapnia (HCC)   Acute metabolic encephalopathy   Endotracheally intubated   On mechanically assisted ventilation (HCC)   Total Time spent with patient: 1 hour  Subjective:   Brandon [name] is a 64 y.o. male patient admitted with "I was feeling really sick".  HPI: Patient seen chart reviewed.  Patient known from previous encounters.  66 year old man with schizoaffective disorder brought to the hospital because of weakness altered mental status confusion and dizziness.  Found to be hyponatremic.  Currently in the intensive care unit.  Patient says he knows that his sodium had gotten really low this time and that is what made him feel so sick and brought him into the hospital.  Patient denies that he was doing any excessive water drinking.  Says he had been compliant with his medicine as usual.  Denies any current active psychiatric symptoms.  Denies any hallucinations or paranoia.  Denies depression and denies suicidal ideation.  Presents as calm and cooperative and appropriate although some slurred speech and a little bit of confusion and some difficulty understanding him.  Past Psychiatric History: Patient has a history of schizoaffective disorder and had been stable on clozapine for a long time.  In the  last few years he has developed a tendency towards hyponatremia.  Ultimate cause unclear.  Credited at some points on water intoxication at least the last time he came in and had very low sodium the labs really did not match up with water intoxication.  Appears to probably have some form of SIADH or other abnormality causing this.  Question is whether it was due to medication.  There are a few scattered reports of clozapine being related to hyponatremia in the literature but for the most part it is usually considered to be very low risk for SIADH.  Patient had failed other medications in the past and had had good response and tolerance otherwise to a fairly low dose of 150 mg of clozapine.  Risk to Self:   Risk to Others:   Prior Inpatient Therapy:   Prior Outpatient Therapy:    Past Medical History:  Past Medical History:  Diagnosis Date   Anemia    IDA   Colon polyps    COPD (chronic obstructive pulmonary disease) (HCC)    Coronary artery disease    Heart attack (HCC) 2005   Hiatal hernia    History of ETOH abuse    Hyperlipidemia    Hypertension    Pleurisy    Schizophrenia (HCC)    Schizophrenia (HCC)    Seizures (HCC)    grand mal in 2000 or 2001 recovery from alcoholism   Suicide attempt [facility])     Past Surgical History:  Procedure Laterality Date   ABDOMINAL SURGERY     internal bleeding   CHOLECYSTECTOMY N/A 05/04/2018   Procedure: LAPAROSCOPIC CHOLECYSTECTOMY, converted to open;  Surgeon: Sung Amabile, DO;  Location: [facility] ORS;  Service: General;  Laterality: N/A;   COLONOSCOPY     COLONOSCOPY WITH PROPOFOL N/A 05/09/2016   Procedure: COLONOSCOPY WITH PROPOFOL;  Surgeon: Scot Jun, MD;  Location: [facility] ENDOSCOPY;  Service: Endoscopy;  Laterality: N/A;   CORONARY ANGIOPLASTY WITH STENT PLACEMENT     1 vessel   ESOPHAGOGASTRODUODENOSCOPY (EGD) WITH PROPOFOL N/A 05/09/2016   Procedure: ESOPHAGOGASTRODUODENOSCOPY (EGD) WITH PROPOFOL;  Surgeon: Scot Jun, MD;   Location: [facility] ENDOSCOPY;  Service: Endoscopy;  Laterality: N/A;   EYE SURGERY Right    GLAUCOMA SURGERY     LAPAROSCOPIC APPENDECTOMY N/A 07/17/2018   Procedure: APPENDECTOMY LAPAROSCOPIC;  Surgeon: Sung Amabile, DO;  Location: [facility] ORS;  Service: General;  Laterality: N/A;   LEFT HEART CATH Right 10/19/2017   Procedure: Left Heart Cath and Coronary Angiography;  Surgeon: Laurier Nancy, MD;  Location: [facility] INVASIVE CV LAB;  Service: Cardiovascular;  Laterality: Right;   NOSE SURGERY     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   VASECTOMY     Family History:  Family History  Problem Relation Age of Onset   Lung cancer Mother    Hypertension Father    Heart attack Father    CAD Father    Prostate cancer Neg Hx    Bladder Cancer Neg Hx    Kidney cancer Neg Hx    Family Psychiatric  History: See previous Social History:  Social History   Substance and Sexual Activity  Alcohol Use No   Comment: no alcohol since 2010     Social History   Substance and Sexual Activity  Drug Use No    Social History   Socioeconomic History   Marital status: Married    Spouse name: Not on file   Number of children: Not on file   Years of education: Not on file   Highest education level: Not on file  Occupational History   Not on file  Tobacco Use   Smoking status: Former    Packs/day: 1.00    Types: Cigarettes    Quit date: 2012    Years since quitting: 10.7   Smokeless tobacco: Former  Building services engineer Use: Never used  Substance and Sexual Activity   Alcohol use: No    Comment: no alcohol since 2010   Drug use: No   Sexual activity: Not on file  Other Topics Concern   Not on file  Social History Narrative   ** Merged History Encounter **       Social Determinants of Health   Financial Resource Strain: Not on file  Food Insecurity: Not on file  Transportation Needs: Not on file  Physical Activity: Not on file  Stress: Not on file  Social Connections: Not on file    Additional Social History:    Allergies:   Allergies  Allergen Reactions   Compazine [Prochlorperazine] Other (See Comments)    Dystonic rxn - convulsions. Near fatal reaction.    Ivp Dye [Iodinated Diagnostic Agents] Shortness Of Breath    Pt states SOB after last IV contrast injection   Alcohol-Sulfur [Elemental Sulfur] Other (See Comments)    History of alcoholism   Benadryl [Diphenhydramine] Other (See Comments)    "stuffy", nasal congestion   Depakote [Divalproex Sodium] Other (See Comments)    Cause elevated ammonia   Dramamine [Dimenhydrinate] Swelling   Plavix [Clopidogrel] Other (See Comments)    Rectal bleeding. "Perforated my intestines."   Rosuvastatin  Other reaction(s): Other (See Comments)    Labs:  Results for orders placed or performed during the hospital encounter of 04/28/21 (from the past 48 hour(s))  Osmolality, urine     Status: Abnormal   Collection Time: 04/28/21  9:15 AM  Result Value Ref Range   Osmolality, Ur 206 (L) 300 - 900 mOsm/kg    Comment: Performed at [facility], [address] [place], [place] [address]  Blood gas, venous     Status: Abnormal   Collection Time: 04/28/21  6:37 PM  Result Value Ref Range   pH, Ven 7.26 7.250 - 7.430   pCO2, Ven 65 (H) 44.0 - 60.0 mmHg   pO2, Ven 33.0 32.0 - 45.0 mmHg   Bicarbonate 29.2 (H) 20.0 - 28.0 mmol/L   Acid-Base Excess 0.9 0.0 - 2.0 mmol/L   O2 Saturation 52.9 %   Patient temperature 37.0    Collection site LINE    Sample type VENOUS     Comment: Performed at [facility], [address] [place], [place] [address]  Resp Panel by RT-PCR (Flu A&B, Covid) Nasopharyngeal Swab     Status: None   Collection Time: 04/28/21  7:05 PM   Specimen: Nasopharyngeal Swab; Nasopharyngeal(NP) swabs in vial transport medium  Result Value Ref Range   SARS Coronavirus 2 by RT PCR NEGATIVE NEGATIVE    Comment: (NOTE) SARS-CoV-2 target nucleic acids are NOT DETECTED.  The  SARS-CoV-2 RNA is generally detectable in upper respiratory specimens during the acute phase of infection. The lowest concentration of SARS-CoV-2 viral copies this assay can detect is 138 copies/mL. A negative result does not preclude SARS-Cov-2 infection and should not be used as the sole basis for treatment or other patient management decisions. A negative result may occur with  improper specimen collection/handling, submission of specimen other than nasopharyngeal swab, presence of viral mutation(s) within the areas targeted by this assay, and inadequate number of viral copies(<138 copies/mL). A negative result must be combined with clinical observations, patient history, and epidemiological information. The expected result is Negative.  Fact Sheet for Patients:  BloggerCourse.com  Fact Sheet for Healthcare Providers:  SeriousBroker.it  This test is no t yet approved or cleared by the [place] FDA and  has been authorized for detection and/or diagnosis of SARS-CoV-2 by FDA under an Emergency Use Authorization (EUA). This EUA will remain  in effect (meaning this test can be used) for the duration of the COVID-19 declaration under Section 564(b)(1) of the Act, 21 U.S.C.section 360bbb-3(b)(1), unless the authorization is terminated  or revoked sooner.       Influenza A by PCR NEGATIVE NEGATIVE   Influenza B by PCR NEGATIVE NEGATIVE    Comment: (NOTE) The Xpert Xpress SARS-CoV-2/FLU/RSV plus assay is intended as an aid in the diagnosis of influenza from Nasopharyngeal swab specimens and should not be used as a sole basis for treatment. Nasal washings and aspirates are unacceptable for Xpert Xpress SARS-CoV-2/FLU/RSV testing.  Fact Sheet for Patients: BloggerCourse.com  Fact Sheet for Healthcare Providers: SeriousBroker.it  This test is not yet approved or cleared by the  [place] FDA and has been authorized for detection and/or diagnosis of SARS-CoV-2 by FDA under an Emergency Use Authorization (EUA). This EUA will remain in effect (meaning this test can be used) for the duration of the COVID-19 declaration under Section 564(b)(1) of the Act, 21 U.S.C. section 360bbb-3(b)(1), unless the authorization is terminated or revoked.  Performed at [facility], [address] [place], [place] [address]   Lactic acid, plasma     Status: None   Collection Time: 04/28/21  7:05 PM  Result Value Ref Range   Lactic Acid, Venous 1.0 0.5 - 1.9 mmol/L    Comment: Performed at [facility], [address] [place], [place] [address]  Comprehensive metabolic panel     Status: Abnormal   Collection Time: 04/28/21  7:05 PM  Result Value Ref Range   Sodium 122 (L) 135 - 145 mmol/L   Potassium 4.5 3.5 - 5.1 mmol/L   Chloride 86 (L) 98 - 111 mmol/L   CO2 28 22 - 32 mmol/L   Glucose, Bld 129 (H) 70 - 99 mg/dL    Comment: Glucose reference range applies only to samples taken after fasting for at least 8 hours.   BUN 7 (L) 8 - 23 mg/dL   Creatinine, Ser 0.98 0.61 - 1.24 mg/dL   Calcium 8.4 (L) 8.9 - 10.3 mg/dL   Total Protein 5.3 (L) 6.5 - 8.1 g/dL   Albumin 2.9 (L) 3.5 - 5.0 g/dL   AST 16 15 - 41 U/L   ALT 13 0 - 44 U/L   Alkaline Phosphatase 78 38 - 126 U/L   Total Bilirubin 0.7 0.3 - 1.2 mg/dL   GFR, Estimated >11 >91 mL/min    Comment: (NOTE) Calculated using the CKD-EPI Creatinine Equation (2021)    Anion gap 8 5 - 15    Comment: Performed at [facility], [address] [place], [place] [address]  CBC WITH DIFFERENTIAL     Status: Abnormal   Collection Time: 04/28/21  7:05 PM  Result Value Ref Range   WBC 9.6 4.0 - 10.5 K/uL   RBC 3.70 (L) 4.22 - 5.81 MIL/uL   Hemoglobin 10.9 (L) 13.0 - 17.0 g/dL   HCT 56.2 (L) 13.0 - 86.5 %   MCV 87.8 80.0 - 100.0 fL   MCH 29.5 26.0 - 34.0 pg   MCHC 33.5 30.0 - 36.0 g/dL   RDW 78.4  69.6 - 29.5 %   Platelets 266 150 - 400 K/uL   nRBC 0.0 0.0 - 0.2 %   Neutrophils Relative % 79 %   Neutro Abs 7.6 1.7 - 7.7 K/uL   Lymphocytes Relative 9 %   Lymphs Abs 0.9 0.7 - 4.0 K/uL   Monocytes Relative 9 %   Monocytes Absolute 0.9 0.1 - 1.0 K/uL   Eosinophils Relative 1 %   Eosinophils Absolute 0.1 0.0 - 0.5 K/uL   Basophils Relative 0 %   Basophils Absolute 0.0 0.0 - 0.1 K/uL   Immature Granulocytes 2 %   Abs Immature Granulocytes 0.17 (H) 0.00 - 0.07 K/uL    Comment: Performed at [facility], [address] [place], [place] [address]  Protime-INR     Status: None   Collection Time: 04/28/21  7:05 PM  Result Value Ref Range   Prothrombin Time 12.0 11.4 - 15.2 seconds   INR 0.9 0.8 - 1.2    Comment: (NOTE) INR goal varies based on device and disease states. Performed at [facility], [address] [place], [place] [address]   APTT     Status: Abnormal   Collection Time: 04/28/21  7:05 PM  Result Value Ref Range   aPTT 22 (L) 24 - 36 seconds    Comment: Performed at [facility], [address], [place], [place] [address]  Blood Culture (routine x 2)  Status: None (Preliminary result)   Collection Time: 04/28/21  7:05 PM   Specimen: BLOOD  Result Value Ref Range   Specimen Description BLOOD  RT FOOT    Special Requests BOTTLES DRAWN AEROBIC ONLY    Culture      NO GROWTH < 12 HOURS Performed at [facility], [address] [place], [place] [address]    Report Status PENDING   Blood Culture (routine x 2)     Status: None (Preliminary result)   Collection Time: 04/28/21  7:05 PM   Specimen: BLOOD  Result Value Ref Range   Specimen Description BLOOD  LFOA    Special Requests      BOTTLES DRAWN AEROBIC AND ANAEROBIC Blood Culture adequate volume   Culture      NO GROWTH < 12 HOURS Performed at [facility], [address], [place], [place] [address]    Report Status PENDING   Osmolality, urine      Status: Abnormal   Collection Time: 04/28/21  7:05 PM  Result Value Ref Range   Osmolality, Ur 206 (L) 300 - 900 mOsm/kg    Comment: Performed at [facility], [address] [place], [place] [address]  Sodium, urine, random     Status: None   Collection Time: 04/28/21  7:05 PM  Result Value Ref Range   Sodium, Ur 38 mmol/L    Comment: Performed at [facility], [address] [place], [place] [address]  Sodium, urine, random     Status: None   Collection Time: 04/28/21  7:05 PM  Result Value Ref Range   Sodium, Ur 38 mmol/L    Comment: Performed at [facility], [address] [place], [place] [address]  Blood gas, arterial     Status: Abnormal   Collection Time: 04/28/21  9:44 PM  Result Value Ref Range   FIO2 0.60    Delivery systems VENTILATOR    Mode ASSIST CONTROL    VT 475 mL   Peep/cpap 5.0 cm H20   pH, Arterial 7.37 7.350 - 7.450   pCO2 arterial 46 32.0 - 48.0 mmHg   pO2, Arterial 65 (L) 83.0 - 108.0 mmHg   Bicarbonate 26.6 20.0 - 28.0 mmol/L   Acid-Base Excess 0.9 0.0 - 2.0 mmol/L   O2 Saturation 91.7 %   Patient temperature 37.0    Collection site LEFT RADIAL    Sample type ARTERIAL DRAW    Allens test (pass/fail) YES (A) PASS   Mechanical Rate 18     Comment: Performed at [facility], [address] [place], [place] [address]  Urinalysis, Complete w Microscopic     Status: Abnormal   Collection Time: 04/28/21 10:17 PM  Result Value Ref Range   Color, Urine YELLOW YELLOW   APPearance CLEAR CLEAR   Specific Gravity, Urine 1.010 1.005 - 1.030   pH 7.0 5.0 - 8.0   Glucose, UA NEGATIVE NEGATIVE mg/dL   Hgb urine dipstick NEGATIVE NEGATIVE   Bilirubin Urine NEGATIVE NEGATIVE   Ketones, ur NEGATIVE NEGATIVE mg/dL   Protein, ur NEGATIVE NEGATIVE mg/dL   Nitrite NEGATIVE NEGATIVE   Leukocytes,Ua NEGATIVE NEGATIVE   Squamous Epithelial / LPF 0-5 0 - 5   WBC, UA 0-5 0 - 5 WBC/hpf   RBC / HPF 0-5 0 - 5 RBC/hpf    Bacteria, UA RARE (A) NONE SEEN   Mucus PRESENT    Hyaline Casts, UA PRESENT    Amorphous Crystal PRESENT  Comment: Performed at [facility], [address], [place], [place] [address]  Urine Culture     Status: None   Collection Time: 04/28/21 10:17 PM   Specimen: Urine, Random  Result Value Ref Range   Specimen Description      URINE, RANDOM Performed at [facility], [address] [place], [place] [address]    Special Requests      NONE Performed at [facility], [address] [place], [place] [address]    Culture      NO GROWTH Performed at [facility], [address], [place], [place] [address]    Report Status 04/29/2021 FINAL   Urine Drug Screen, Qualitative ([facility] only)     Status: Abnormal   Collection Time: 04/28/21 10:17 PM  Result Value Ref Range   Tricyclic, Ur Screen POSITIVE (A) NONE DETECTED   Amphetamines, Ur Screen NONE DETECTED NONE DETECTED   MDMA (Ecstasy)Ur Screen NONE DETECTED NONE DETECTED   Cocaine Metabolite,Ur [place] NONE DETECTED NONE DETECTED   Opiate, Ur Screen NONE DETECTED NONE DETECTED   Phencyclidine (PCP) Ur S NONE DETECTED NONE DETECTED   Cannabinoid 50 Ng, Ur [place] NONE DETECTED NONE DETECTED   Barbiturates, Ur Screen NONE DETECTED NONE DETECTED   Benzodiazepine, Ur Scrn NONE DETECTED NONE DETECTED   Methadone Scn, Ur NONE DETECTED NONE DETECTED    Comment: (NOTE) Tricyclics + metabolites, urine    Cutoff 1000 ng/mL Amphetamines + metabolites, urine  Cutoff 1000 ng/mL MDMA (Ecstasy), urine              Cutoff 500 ng/mL Cocaine Metabolite, urine          Cutoff 300 ng/mL Opiate + metabolites, urine        Cutoff 300 ng/mL Phencyclidine (PCP), urine         Cutoff 25 ng/mL Cannabinoid, urine                 Cutoff 50 ng/mL Barbiturates + metabolites, urine  Cutoff 200 ng/mL Benzodiazepine, urine              Cutoff 200 ng/mL Methadone, urine                   Cutoff 300 ng/mL  The urine drug screen  provides only a preliminary, unconfirmed analytical test result and should not be used for non-medical purposes. Clinical consideration and professional judgment should be applied to any positive drug screen result due to possible interfering substances. A more specific alternate chemical method must be used in order to obtain a confirmed analytical result. Gas chromatography / mass spectrometry (GC/MS) is the preferred confirm atory method. Performed at [facility], [address] [place], [place] [address]   Glucose, capillary     Status: Abnormal   Collection Time: 04/29/21 12:52 AM  Result Value Ref Range   Glucose-Capillary 103 (H) 70 - 99 mg/dL    Comment: Glucose reference range applies only to samples taken after fasting for at least 8 hours.  MRSA Next Gen by PCR, Nasal     Status: None   Collection Time: 04/29/21 12:55 AM   Specimen: Nasal Mucosa; Nasal Swab  Result Value Ref Range   MRSA by PCR Next Gen NOT DETECTED NOT DETECTED    Comment: (NOTE) The GeneXpert MRSA Assay (FDA approved for NASAL specimens only), is one component of a comprehensive MRSA colonization surveillance program. It is not intended to diagnose MRSA infection nor to  guide or monitor treatment for MRSA infections. Test performance is not FDA approved in patients less than 65 years old. Performed at [facility], [address] [place], [place] [address]   Sodium     Status: Abnormal   Collection Time: 04/29/21  1:13 AM  Result Value Ref Range   Sodium 122 (L) 135 - 145 mmol/L    Comment: Performed at [facility], [address] [place], [place] [address]  Osmolality     Status: Abnormal   Collection Time: 04/29/21  1:13 AM  Result Value Ref Range   Osmolality 256 (L) 275 - 295 mOsm/kg    Comment: Performed at [facility], [address] [place], [place] [address]  Procalcitonin     Status: None   Collection Time: 04/29/21  2:17 AM  Result Value  Ref Range   Procalcitonin <0.10 ng/mL    Comment:        Interpretation: PCT (Procalcitonin) <= 0.5 ng/mL: Systemic infection (sepsis) is not likely. Local bacterial infection is possible. (NOTE)       Sepsis PCT Algorithm           Lower Respiratory Tract                                      Infection PCT Algorithm    ----------------------------     ----------------------------         PCT < 0.25 ng/mL                PCT < 0.10 ng/mL          Strongly encourage             Strongly discourage   discontinuation of antibiotics    initiation of antibiotics    ----------------------------     -----------------------------       PCT 0.25 - 0.50 ng/mL            PCT 0.10 - 0.25 ng/mL               OR       >80% decrease in PCT            Discourage initiation of                                            antibiotics      Encourage discontinuation           of antibiotics    ----------------------------     -----------------------------         PCT >= 0.50 ng/mL              PCT 0.26 - 0.50 ng/mL               AND        <80% decrease in PCT             Encourage initiation of                                             antibiotics       Encourage continuation  of antibiotics    ----------------------------     -----------------------------        PCT >= 0.50 ng/mL                  PCT > 0.50 ng/mL               AND         increase in PCT                  Strongly encourage                                      initiation of antibiotics    Strongly encourage escalation           of antibiotics                                     -----------------------------                                           PCT <= 0.25 ng/mL                                                 OR                                        > 80% decrease in PCT                                      Discontinue / Do not initiate                                             antibiotics  Performed at  [facility], [address] [place], [place] [address]   Basic metabolic panel     Status: Abnormal   Collection Time: 04/29/21  5:36 AM  Result Value Ref Range   Sodium 127 (L) 135 - 145 mmol/L   Potassium 3.9 3.5 - 5.1 mmol/L   Chloride 92 (L) 98 - 111 mmol/L   CO2 28 22 - 32 mmol/L   Glucose, Bld 115 (H) 70 - 99 mg/dL    Comment: Glucose reference range applies only to samples taken after fasting for at least 8 hours.   BUN 7 (L) 8 - 23 mg/dL   Creatinine, Ser 6.04 0.61 - 1.24 mg/dL   Calcium 8.9 8.9 - 54.0 mg/dL   GFR, Estimated >98 >11 mL/min    Comment: (NOTE) Calculated using the CKD-EPI Creatinine Equation (2021)    Anion gap 7 5 - 15    Comment: Performed at [facility], [address], [place], [place] [address]  CBC     Status: None   Collection Time: 04/29/21  5:36 AM  Result Value Ref  Range   WBC 8.3 4.0 - 10.5 K/uL   RBC 4.68 4.22 - 5.81 MIL/uL   Hemoglobin 13.2 13.0 - 17.0 g/dL   HCT 16.1 09.6 - 04.5 %   MCV 86.8 80.0 - 100.0 fL   MCH 28.2 26.0 - 34.0 pg   MCHC 32.5 30.0 - 36.0 g/dL   RDW 40.9 81.1 - 91.4 %   Platelets 303 150 - 400 K/uL   nRBC 0.0 0.0 - 0.2 %    Comment: Performed at [facility], [address], [place], [place] [address]  Magnesium     Status: None   Collection Time: 04/29/21  5:36 AM  Result Value Ref Range   Magnesium 1.9 1.7 - 2.4 mg/dL    Comment: Performed at [facility], [address] [place], [place] [address]  Phosphorus     Status: None   Collection Time: 04/29/21  5:36 AM  Result Value Ref Range   Phosphorus 3.4 2.5 - 4.6 mg/dL    Comment: Performed at [facility], [address] [place], [place] [address]  Procalcitonin     Status: None   Collection Time: 04/29/21  5:36 AM  Result Value Ref Range   Procalcitonin <0.10 ng/mL    Comment:        Interpretation: PCT (Procalcitonin) <= 0.5 ng/mL: Systemic infection (sepsis) is not likely. Local bacterial infection is  possible. (NOTE)       Sepsis PCT Algorithm           Lower Respiratory Tract                                      Infection PCT Algorithm    ----------------------------     ----------------------------         PCT < 0.25 ng/mL                PCT < 0.10 ng/mL          Strongly encourage             Strongly discourage   discontinuation of antibiotics    initiation of antibiotics    ----------------------------     -----------------------------       PCT 0.25 - 0.50 ng/mL            PCT 0.10 - 0.25 ng/mL               OR       >80% decrease in PCT            Discourage initiation of                                            antibiotics      Encourage discontinuation           of antibiotics    ----------------------------     -----------------------------         PCT >= 0.50 ng/mL              PCT 0.26 - 0.50 ng/mL               AND        <80% decrease in PCT             Encourage initiation of  antibiotics       Encourage continuation           of antibiotics    ----------------------------     -----------------------------        PCT >= 0.50 ng/mL                  PCT > 0.50 ng/mL               AND         increase in PCT                  Strongly encourage                                      initiation of antibiotics    Strongly encourage escalation           of antibiotics                                     -----------------------------                                           PCT <= 0.25 ng/mL                                                 OR                                        > 80% decrease in PCT                                      Discontinue / Do not initiate                                             antibiotics  Performed at  [facility], [address] [place], [place] [address]   TSH     Status: None   Collection Time: 04/29/21  5:36 AM  Result Value Ref Range   TSH 0.975 0.350 - 4.500 uIU/mL     Comment: Performed by a 3rd Generation assay with a functional sensitivity of <=0.01 uIU/mL. Performed at [facility], [address] [place], [place] [address]   Cortisol-am, blood     Status: None   Collection Time: 04/29/21  5:36 AM  Result Value Ref Range   Cortisol - AM 9.5 6.7 - 22.6 ug/dL    Comment: Performed at [facility], [address], [place], [place] [address]  Glucose, capillary     Status: Abnormal   Collection Time: 04/29/21  7:24 AM  Result Value Ref Range   Glucose-Capillary 113 (H) 70 - 99 mg/dL    Comment: Glucose reference range applies only to samples taken after fasting for at least 8  hours.  Glucose, capillary     Status: Abnormal   Collection Time: 04/29/21 11:46 AM  Result Value Ref Range   Glucose-Capillary 199 (H) 70 - 99 mg/dL    Comment: Glucose reference range applies only to samples taken after fasting for at least 8 hours.  Glucose, capillary     Status: Abnormal   Collection Time: 04/29/21  3:24 PM  Result Value Ref Range   Glucose-Capillary 100 (H) 70 - 99 mg/dL    Comment: Glucose reference range applies only to samples taken after fasting for at least 8 hours.  Glucose, capillary     Status: Abnormal   Collection Time: 04/29/21  8:45 PM  Result Value Ref Range   Glucose-Capillary 107 (H) 70 - 99 mg/dL    Comment: Glucose reference range applies only to samples taken after fasting for at least 8 hours.    Current Facility-Administered Medications  Medication Dose Route Frequency Provider Last Rate Last Admin   0.9 %  sodium chloride infusion   Intravenous Continuous Rust-Chester, Cecelia Byars, NP 100 mL/hr at 04/29/21 1714 Infusion Verify at 04/29/21 1714   albuterol (PROVENTIL) (2.5 MG/3ML) 0.083% nebulizer solution 3 mL  3 mL Inhalation Q6H PRN Lianne Cure, NP       Ampicillin-Sulbactam (UNASYN) 3 g in sodium chloride 0.9 % 100 mL IVPB  3 g Intravenous Q6H Martina Sinner, MD   Stopped at 04/29/21 1707   atorvastatin  (LIPITOR) tablet 20 mg  20 mg Oral QHS Lianne Cure, NP       buPROPion ER [facility] SR) 12 hr tablet 100 mg  100 mg Oral Daily Lianne Cure, NP   100 mg at 04/29/21 1240   Chlorhexidine Gluconate Cloth 2 % PADS 6 each  6 each Topical Daily Martina Sinner, MD       cholecalciferol (VITAMIN D3) tablet 1,000 Units  1,000 Units Oral Daily Lianne Cure, NP   1,000 Units at 04/29/21 1240   cloZAPine (CLOZARIL) tablet 100 mg  100 mg Oral QHS Mikala Podoll, Jackquline Denmark, MD       docusate sodium (COLACE) capsule 100 mg  100 mg Oral BID PRN Rust-Chester, Cecelia Byars, NP       docusate sodium (COLACE) capsule 100 mg  100 mg Oral BID Lianne Cure, NP       dorzolamide-timolol (COSOPT) 22.3-6.8 MG/ML ophthalmic solution 1 drop  1 drop Both Eyes BID Rust-Chester, Cecelia Byars, NP   1 drop at 04/29/21 0835   enoxaparin (LOVENOX) injection 40 mg  40 mg Subcutaneous Q24H Rust-Chester, Cecelia Byars, NP   40 mg at 04/28/21 2350   ezetimibe (ZETIA) tablet 10 mg  10 mg Oral QHS Graves, Rosalva Ferron, NP       hydrOXYzine (ATARAX/VISTARIL) tablet 25 mg  25 mg Oral TID PRN Lianne Cure, NP       ipratropium-albuterol (DUONEB) 0.5-2.5 (3) MG/3ML nebulizer solution 3 mL  3 mL Nebulization Q4H PRN Rust-Chester, Britton L, NP       latanoprost (XALATAN) 0.005 % ophthalmic solution 1 drop  1 drop Both Eyes QHS Graves, Dana E, NP       mometasone-formoterol (DULERA) 100-5 MCG/ACT inhaler 2 puff  2 puff Inhalation BID Lianne Cure, NP       nitroGLYCERIN (NITROSTAT) SL tablet 0.4 mg  0.4 mg Sublingual Q5 min PRN Graves, Rosalva Ferron, NP       pantoprazole (PROTONIX) EC tablet 40 mg  40 mg  Oral BID Lianne Cure, NP   40 mg at 04/29/21 1245   polyethylene glycol (MIRALAX / GLYCOLAX) packet 17 g  17 g Oral Daily PRN Rust-Chester, Cecelia Byars, NP       [START ON 04/30/2021] predniSONE (DELTASONE) tablet 40 mg  40 mg Oral Q breakfast Graves, Rosalva Ferron, NP       senna (SENOKOT) tablet 8.6-17.2 mg  1-2 tablet Oral Daily PRN Lianne Cure, NP        tamsulosin (FLOMAX) capsule 0.4 mg  0.4 mg Oral Daily Lianne Cure, NP   0.4 mg at 04/29/21 1242    Musculoskeletal: Strength & Muscle Tone: within normal limits Gait & Station: unsteady Patient leans: N/A            Psychiatric Specialty Exam:  Presentation  General Appearance:  No data recorded Eye Contact: No data recorded Speech: No data recorded Speech Volume: No data recorded Handedness: No data recorded  Mood and Affect  Mood: No data recorded Affect: No data recorded  Thought Process  Thought Processes: No data recorded Descriptions of Associations:No data recorded Orientation:No data recorded Thought Content:No data recorded History of Schizophrenia/Schizoaffective disorder:No data recorded Duration of Psychotic Symptoms:No data recorded Hallucinations:No data recorded Ideas of Reference:No data recorded Suicidal Thoughts:No data recorded Homicidal Thoughts:No data recorded  Sensorium  Memory: No data recorded Judgment: No data recorded Insight: No data recorded  Executive Functions  Concentration: No data recorded Attention Span: No data recorded Recall: No data recorded Fund of Knowledge: No data recorded Language: No data recorded  Psychomotor Activity  Psychomotor Activity: No data recorded  Assets  Assets: No data recorded  Sleep  Sleep: No data recorded  Physical Exam: Physical Exam Vitals and nursing note reviewed.  Constitutional:      Appearance: Normal appearance.  HENT:     Head: Normocephalic and atraumatic.     Mouth/Throat:     Pharynx: Oropharynx is clear.  Eyes:     Pupils: Pupils are equal, round, and reactive to light.  Cardiovascular:     Rate and Rhythm: Normal rate and regular rhythm.  Pulmonary:     Effort: Pulmonary effort is normal.     Breath sounds: Normal breath sounds.  Abdominal:     General: Abdomen is flat.     Palpations: Abdomen is soft.  Musculoskeletal:        General:  Normal range of motion.  Skin:    General: Skin is warm and dry.  Neurological:     General: No focal deficit present.     Mental Status: He is alert. Mental status is at baseline.  Psychiatric:        Mood and Affect: Mood normal.        Thought Content: Thought content normal.   Review of Systems  Constitutional: Negative.   HENT: Negative.    Eyes: Negative.   Respiratory: Negative.    Cardiovascular: Negative.   Gastrointestinal: Negative.   Musculoskeletal: Negative.   Skin: Negative.   Neurological: Negative.   Psychiatric/Behavioral: Negative.    Blood pressure (!) 150/91, pulse 97, temperature 98 F (36.7 C), temperature source Oral, resp. rate (!) 22, weight 86.4 kg, SpO2 99 %. Body mass index is 25.83 kg/m.  Treatment Plan Summary: Medication management and Plan patient and I talked about clozapine dose.  He felt the 150 might be too high.  Does not sound like there was any very clear specific reason.  Nevertheless I told him I  was fine with starting him back at 100 mg.  Although he has been off it now for a day or so that should be safe to do given how long he has tolerated it before.  Absolute neutrophil count is fine.  I very much doubt that the Clozapine is the direct cause of his hyponatremia.  We will restart Clozapine 100 mg at night.  Follow up as needed.  Disposition: No evidence of imminent risk to self or others at present.   Patient does not meet criteria for psychiatric inpatient admission. Supportive therapy provided about ongoing stressors. Discussed crisis plan, support from social network, calling 911, coming to the Emergency Department, and calling Suicide Hotline.  Mordecai Rasmussen, MD 04/29/2021 8:48 PM

## 2021-04-30 ENCOUNTER — Inpatient Hospital Stay: Payer: Medicare Other

## 2021-04-30 DIAGNOSIS — F250 Schizoaffective disorder, bipolar type: Secondary | ICD-10-CM | POA: Diagnosis not present

## 2021-04-30 LAB — PHOSPHORUS: Phosphorus: 3.1 mg/dL (ref 2.5–4.6)

## 2021-04-30 LAB — BASIC METABOLIC PANEL
Anion gap: 5 (ref 5–15)
BUN: 7 mg/dL — ABNORMAL LOW (ref 8–23)
CO2: 28 mmol/L (ref 22–32)
Calcium: 8.5 mg/dL — ABNORMAL LOW (ref 8.9–10.3)
Chloride: 97 mmol/L — ABNORMAL LOW (ref 98–111)
Creatinine, Ser: 0.63 mg/dL (ref 0.61–1.24)
GFR, Estimated: >60 mL/min (ref >60)
Glucose, Bld: 95 mg/dL (ref 70–99)
Potassium: 3.7 mmol/L (ref 3.5–5.1)
Sodium: 130 mmol/L — ABNORMAL LOW (ref 135–145)

## 2021-04-30 LAB — CBC WITH DIFFERENTIAL/PLATELET
Abs Immature Granulocytes: 0.05 10*3/uL (ref 0.00–0.07)
Basophils Absolute: 0 10*3/uL (ref 0.0–0.1)
Basophils Relative: 0 %
Eosinophils Absolute: 0 10*3/uL (ref 0.0–0.5)
Eosinophils Relative: 0 %
HCT: 35.1 % — ABNORMAL LOW (ref 39.0–52.0)
Hemoglobin: 11.7 g/dL — ABNORMAL LOW (ref 13.0–17.0)
Immature Granulocytes: 1 %
Lymphocytes Relative: 16 %
Lymphs Abs: 1.3 10*3/uL (ref 0.7–4.0)
MCH: 29.2 pg (ref 26.0–34.0)
MCHC: 33.3 g/dL (ref 30.0–36.0)
MCV: 87.5 fL (ref 80.0–100.0)
Monocytes Absolute: 0.8 10*3/uL (ref 0.1–1.0)
Monocytes Relative: 9 %
Neutro Abs: 6.1 10*3/uL (ref 1.7–7.7)
Neutrophils Relative %: 74 %
Platelets: 302 10*3/uL (ref 150–400)
RBC: 4.01 MIL/uL — ABNORMAL LOW (ref 4.22–5.81)
RDW: 14.2 % (ref 11.5–15.5)
WBC: 8.2 10*3/uL (ref 4.0–10.5)
nRBC: 0 % (ref 0.0–0.2)

## 2021-04-30 LAB — BLOOD GAS, ARTERIAL
Acid-Base Excess: 4 mmol/L — ABNORMAL HIGH (ref 0.0–2.0)
Acid-Base Excess: 6.1 mmol/L — ABNORMAL HIGH (ref 0.0–2.0)
Allens test (pass/fail): POSITIVE — AB
Bicarbonate: 30.9 mmol/L — ABNORMAL HIGH (ref 20.0–28.0)
Bicarbonate: 29.6 mmol/L — ABNORMAL HIGH (ref 20.0–28.0)
FIO2: 0.3
FIO2: 30
MECHVT: 500 mL
O2 Saturation: 94.7 %
O2 Saturation: 94.2 %
Patient temperature: 37
Patient temperature: 37
RATE: 20 resp/min
pCO2 arterial: 56 mmHg — ABNORMAL HIGH (ref 32.0–48.0)
pCO2 arterial: 38 mmHg (ref 32.0–48.0)
pH, Arterial: 7.35 (ref 7.350–7.450)
pH, Arterial: 7.5 — ABNORMAL HIGH (ref 7.350–7.450)
pO2, Arterial: 78 mmHg — ABNORMAL LOW (ref 83.0–108.0)
pO2, Arterial: 65 mmHg — ABNORMAL LOW (ref 83.0–108.0)

## 2021-04-30 LAB — GLUCOSE, CAPILLARY
Glucose-Capillary: 106 mg/dL — ABNORMAL HIGH (ref 70–99)
Glucose-Capillary: 109 mg/dL — ABNORMAL HIGH (ref 70–99)
Glucose-Capillary: 113 mg/dL — ABNORMAL HIGH (ref 70–99)
Glucose-Capillary: 117 mg/dL — ABNORMAL HIGH (ref 70–99)
Glucose-Capillary: 92 mg/dL (ref 70–99)
Glucose-Capillary: 93 mg/dL (ref 70–99)
Glucose-Capillary: 99 mg/dL (ref 70–99)

## 2021-04-30 LAB — THYROID PANEL WITH TSH
Free Thyroxine Index: 2.4 (ref 1.2–4.9)
T3 Uptake Ratio: 31 % (ref 24–39)
T4, Total: 7.9 ug/dL (ref 4.5–12.0)
TSH: 1.2 u[IU]/mL (ref 0.450–4.500)

## 2021-04-30 LAB — PROCALCITONIN: Procalcitonin: <0.1 ng/mL

## 2021-04-30 LAB — SODIUM: Sodium: 130 mmol/L — ABNORMAL LOW (ref 135–145)

## 2021-04-30 LAB — MAGNESIUM: Magnesium: 1.7 mg/dL (ref 1.7–2.4)

## 2021-04-30 MED ORDER — FENTANYL CITRATE (PF) 100 MCG/2ML IJ SOLN
50.0000 ug | INTRAMUSCULAR | Status: DC | PRN
  Administered 2021-04-30 – 2021-05-01 (×2): 100 ug via INTRAVENOUS
  Filled 2021-04-30 (×3): qty 2

## 2021-04-30 MED ORDER — DEXMEDETOMIDINE HCL IN NACL 400 MCG/100ML IV SOLN
0.4000 ug/kg/h | INTRAVENOUS | Status: DC
Start: 2021-04-30 — End: 2021-05-01
  Administered 2021-04-30: 0.4 ug/kg/h via INTRAVENOUS
  Administered 2021-05-01: 0.7 ug/kg/h via INTRAVENOUS
  Filled 2021-04-30 (×2): qty 100

## 2021-04-30 MED ORDER — ROCURONIUM BROMIDE 10 MG/ML (PF) SYRINGE
100.0000 mg | PREFILLED_SYRINGE | Freq: Once | INTRAVENOUS | Status: AC
  Administered 2021-04-30: 100 mg via INTRAVENOUS
  Filled 2021-04-30: qty 10

## 2021-04-30 MED ORDER — FENTANYL CITRATE (PF) 100 MCG/2ML IJ SOLN
50.0000 ug | INTRAMUSCULAR | Status: DC | PRN
  Administered 2021-04-30 (×2): 50 ug via INTRAVENOUS
  Filled 2021-04-30: qty 2

## 2021-04-30 MED ORDER — FENTANYL CITRATE (PF) 100 MCG/2ML IJ SOLN
50.0000 ug | Freq: Once | INTRAMUSCULAR | Status: AC
  Administered 2021-04-30: 50 ug via INTRAVENOUS
  Filled 2021-04-30: qty 2

## 2021-04-30 MED ORDER — EZETIMIBE 10 MG PO TABS
10.0000 mg | ORAL_TABLET | Freq: Every day | ORAL | Status: DC
  Administered 2021-04-30: 10 mg
  Filled 2021-04-30 (×2): qty 1

## 2021-04-30 MED ORDER — PANTOPRAZOLE 2 MG/ML SUSPENSION
40.0000 mg | Freq: Two times a day (BID) | ORAL | Status: DC
  Administered 2021-05-01: 40 mg
  Filled 2021-04-30: qty 20

## 2021-04-30 MED ORDER — MAGNESIUM SULFATE 2 GM/50ML IV SOLN
2.0000 g | Freq: Once | INTRAVENOUS | Status: AC
  Administered 2021-04-30: 2 g via INTRAVENOUS
  Filled 2021-04-30: qty 50

## 2021-04-30 MED ORDER — ATORVASTATIN CALCIUM 20 MG PO TABS: 20.0000 mg | ORAL_TABLET | Freq: Every day | ORAL | Status: DC

## 2021-04-30 MED ORDER — ARFORMOTEROL TARTRATE 15 MCG/2ML IN NEBU
15.0000 ug | INHALATION_SOLUTION | Freq: Two times a day (BID) | RESPIRATORY_TRACT | Status: DC
  Administered 2021-04-30 – 2021-05-17 (×34): 15 ug via RESPIRATORY_TRACT
  Filled 2021-04-30 (×36): qty 2

## 2021-04-30 MED ORDER — HYDRALAZINE HCL 20 MG/ML IJ SOLN
10.0000 mg | Freq: Four times a day (QID) | INTRAMUSCULAR | Status: DC | PRN
  Administered 2021-04-30 – 2021-05-01 (×2): 10 mg via INTRAVENOUS
  Filled 2021-04-30 (×2): qty 1

## 2021-04-30 MED ORDER — ETOMIDATE 2 MG/ML IV SOLN
20.0000 mg | Freq: Once | INTRAVENOUS | Status: AC
  Administered 2021-04-30: 20 mg via INTRAVENOUS
  Filled 2021-04-30: qty 10

## 2021-04-30 MED ORDER — MIDAZOLAM HCL 2 MG/2ML IJ SOLN: 2.0000 mg | INTRAMUSCULAR | Status: DC | PRN

## 2021-04-30 MED ORDER — IPRATROPIUM-ALBUTEROL 0.5-2.5 (3) MG/3ML IN SOLN
3.0000 mL | Freq: Four times a day (QID) | RESPIRATORY_TRACT | Status: DC
  Filled 2021-04-30: qty 3

## 2021-04-30 MED ORDER — ACETYLCYSTEINE 20 % IN SOLN
4.0000 mL | Freq: Two times a day (BID) | RESPIRATORY_TRACT | Status: DC
  Administered 2021-04-30 – 2021-05-03 (×6): 4 mL via RESPIRATORY_TRACT
  Filled 2021-04-30 (×6): qty 4

## 2021-04-30 MED ORDER — METHYLPREDNISOLONE SODIUM SUCC 40 MG IJ SOLR
40.0000 mg | Freq: Every day | INTRAMUSCULAR | Status: DC
  Administered 2021-04-30 – 2021-05-02 (×3): 40 mg via INTRAVENOUS
  Filled 2021-04-30 (×3): qty 1

## 2021-04-30 MED ORDER — VITAMIN D 25 MCG (1000 UNIT) PO TABS
1000.0000 [IU] | ORAL_TABLET | Freq: Every day | ORAL | Status: DC
  Administered 2021-05-01: 1000 [IU]
  Filled 2021-04-30: qty 1

## 2021-04-30 MED ORDER — DOCUSATE SODIUM 50 MG/5ML PO LIQD
100.0000 mg | Freq: Two times a day (BID) | ORAL | Status: DC
  Administered 2021-05-01: 100 mg
  Filled 2021-04-30: qty 10

## 2021-04-30 MED ORDER — MIDAZOLAM HCL 2 MG/2ML IJ SOLN
4.0000 mg | Freq: Once | INTRAMUSCULAR | Status: AC
  Administered 2021-04-30: 2 mg via INTRAVENOUS
  Filled 2021-04-30: qty 4

## 2021-04-30 MED ORDER — REVEFENACIN 175 MCG/3ML IN SOLN
175.0000 ug | Freq: Every day | RESPIRATORY_TRACT | Status: DC
  Administered 2021-05-01 – 2021-05-17 (×16): 175 ug via RESPIRATORY_TRACT
  Filled 2021-04-30 (×20): qty 3

## 2021-04-30 NOTE — Progress Notes (Signed)
Bladder scanned patient per Hospitalist 611 ml. Per order place foley catheter. Patient having labored breathing intermit. Rhonchi/Coarse. Orders for breathing tx, speech evaluation. Continue to assess.

## 2021-04-30 NOTE — Progress Notes (Signed)
NAME:  Brandon [name], MRN:  [id number], DOB:  Jan 27, 1956, LOS: 2 ADMISSION DATE:  04/28/2021, CONSULTATION DATE:  04/28/21 REFERRING MD:  Dr. Roxan [name], CHIEF COMPLAINT: Altered mental status  History of Present Illness:  65 year old male presenting to [facility] ED on 04/28/2021 from SN[facility] via EMS.  Staff at [facility] called EMS after patient was found to be unresponsive and reportedly hypotensive in the 50s for an unknown amount of time.  Staff had stated earlier they thought the patient had been napping.  EMS started IV fluids and continued chronic oxygen in route, with reports of blood pressure improvement.  Per ED documentation upon arrival patient responsive to sternal rub, altered and not following commands. ED course: Patient initially appeared to improve with IV fluids and was protecting his airway.  However within 2 hours after arrival patient became hypoxic with SPO2 in the 70s and was too confused to safely tolerate BiPAP requiring emergent intubation and mechanical ventilatory support.  Of note this is the patient's sixth hospital admission in 2022 for a combination of hyponatremia/hypoxia with or without unresponsiveness.  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 Initial vitals: Afebrile at 96.4, tachypneic at 26, NSR at 79, BP marginal at 95/69 with SPO2 99% on 4 L nasal cannula.  Significant labs: Labs/ Imaging personally reviewed CXR 04/28/2021 (before intubation): Linear atelectasis or fibrosis in the left midlung and right lung base no focal consolidation. CT head without contrast & CT cervical spine without contrast 04/28/2021: No acute intracranial abnormalities, mild cerebral atrophy.  Diffuse degenerative change in the cervical spine no acute displaced fractures.  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 04/29/2021-Pt successfully extubated to 4L O2 via nasal canula  04/30/2021 - Patient becoming more lethargic this morning, became unarousable with ABG showing hypercapnia, PCCM re-consulted  Interim History / Subjective:   Patient did well yesterday after extubation and overnight. He was more lethargic this morning which increased throughout the morning and he was unarousable to sternal rub.   ABG showed pH 7.35, pCO2 56, pO2 72 which prompted him to be started on bipap therapy and PCCM called to evaluate the patient.  Objective   Blood pressure (!) 153/87, pulse 86, temperature 98.1 F (36.7 C), resp. rate (!) 25, weight 86.4 kg, SpO2 99 %.        Intake/Output Summary (Last 24 hours) at 04/30/2021 1421 Last data filed at 04/30/2021 4097 Gross per 24 hour  Intake 695.49 ml  Output 1450 ml  Net -754.51 ml   Filed Weights   04/28/21 1901 04/28/21 2057 04/29/21 3532  Weight: 82.6 kg 85.2 kg 86.4 kg    Examination: General: elderly male, no acute distress, unarousable, bipap in place HEENT: Supple, no JVD  Neuro: unarousable, PERRLA CV: NSR, s1s2, no M/R/G, no edema  Pulm: diminished breath sounds on left, clear on right GI: +BS x4, soft, non distended, non tender GU: no foley Skin: nor ashes Extremities: Normal bulk and tone, warm, no edema  Resolved Hospital Problem list     Assessment & Plan:  Acute Encephalopathy multifactorial  in the setting of hypoxia and possibly medication induced (psychiatric and/or antihypertensive  Possible syncopal episodes  PMHx:  Schizophrenia, Seizures (ETOH withdrawal related), ETOH abuse UDS + for tricyclics (however upon Rx consultation Wellbutrin can cause + tricyclic result). CTH/Cervical Spine negative for abnormality - Psych consulted and clozaril restarted last night at 100mg . Concern for sedated state due to the clozaril. Will discontinue this medication. - Continue outpatient wellbutrin sr and prn vistaril  - Frequent reorientation  - PT/OT consult   Acute on chronic combined hypoxic & Hypercapnic Respiratory Failure secondary to suspected aspiration in the setting of acute encephalopathy Suspected Aspiration Pneumonia PMHx: COPD - Continue bipap PRN for hypercapnia, low threshold for intubation if no improvement - Check chest radiograph - Continue scheduled and prn bronchodilator therapy  - Resume IV solumedrol 40mg  daily  Orthostatic Hypotension - Cardiology notified about patient's orthostasis and recommendation on his outpatient regimen.  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  He states his brother does have a wife, however she currently resides at a SNF and he does not have her contact info.  He does state his brother has informed him he would like him to be his HCPOA in the event the pt is unable to make  decisions for himself.  Pts RN has consulted [facility] to assist the pt and pts brother Brandon [name] with completing the POA paperwork.   Labs   CBC: Recent Labs  Lab 04/28/21 1905 04/29/21 0536 04/30/21 0148  WBC 9.6 8.3 8.2  NEUTROABS 7.6  --  6.1  HGB 10.9* 13.2 11.7*  HCT 32.5* 40.6 35.1*  MCV 87.8 86.8 87.5  PLT 266 303 302    Basic Metabolic Panel: Recent Labs  Lab 04/28/21 1905 04/29/21 0113 04/29/21 0536 04/29/21 2127 04/30/21 0148 04/30/21 0541  NA 122* 122* 127* 129* 130* 130*  K 4.5  --  3.9  --  3.7  --   CL 86*  --  92*  --  97*  --   CO2 28  --  28  --  28  --   GLUCOSE 129*  --  115*  --  95  --   BUN 7*  --  7*  --  7*  --   CREATININE 1.15  --  0.67  --  0.63  --   CALCIUM 8.4*  --  8.9  --  8.5*  --   MG  --   --  1.9  --  1.7  --   PHOS  --   --  3.4  --  3.1  --    GFR: Estimated Creatinine Clearance: 101 mL/min (by C-G formula based on SCr of 0.63 mg/dL). Recent Labs  Lab 04/28/21 1905 04/29/21 0217 04/29/21 0536 04/30/21 0148  PROCALCITON  --  <0.10 <0.10 <0.10  WBC 9.6  --  8.3  8.2  LATICACIDVEN 1.0  --   --   --     Liver Function Tests: Recent Labs  Lab 04/28/21 1905  AST 16  ALT 13  ALKPHOS 78  BILITOT 0.7  PROT 5.3*  ALBUMIN 2.9*   No results for input(s): LIPASE, AMYLASE in the last 168 hours. No results for input(s): AMMONIA in the last 168 hours.  ABG    Component Value Date/Time   PHART 7.35 04/30/2021 1335   PCO2ART 56 (H) 04/30/2021 1335   PO2ART 78 (L) 04/30/2021 1335   HCO3 30.9 (H) 04/30/2021 1335   O2SAT 94.7 04/30/2021 1335     Coagulation Profile: Recent Labs  Lab 04/28/21 1905  INR 0.9    Cardiac Enzymes: No results for input(s): CKTOTAL, CKMB, CKMBINDEX, TROPONINI in the last 168 hours.  HbA1C: Hemoglobin A1C  Date/Time Value Ref Range Status  09/11/2014 03:25 PM 6.3 4.2 - 6.3 % Final    Comment:    The American Diabetes Association recommends that a primary goal of therapy  should be <7% and that physicians should reevaluate the treatment regimen in patients with HbA1c values consistently >8%.    Hgb A1c MFr Bld  Date/Time Value Ref Range Status  11/28/2020 05:53 AM 6.3 (H) 4.8 - 5.6 % Final    Comment:    (NOTE) Pre diabetes:          5.7%-6.4%  Diabetes:              >6.4%  Glycemic control for   <7.0% adults with diabetes     CBG: Recent Labs  Lab 04/29/21 2045 04/30/21 0050 04/30/21 0428 04/30/21 0817 04/30/21 1117  GLUCAP 107* 92 93 99 106*    Critical care time: 35 minutes     Melody Comas, MD [facility] Office: [phone]   See Amion for personal pager PCCM on call pager [phone] until 7pm. Please call Elink 7p-7a. [phone]

## 2021-04-30 NOTE — Progress Notes (Signed)
OG placed- Pending KUB results.

## 2021-04-30 NOTE — Progress Notes (Signed)
Patient intubated per Dr Francine Graven. Patients blood pressure 183/96. Per MD give 50 mcg of fentanyl. Continue assess.

## 2021-04-30 NOTE — Progress Notes (Signed)
Patient was transferred to medical service overnight but patient appears to be very lethargic with diminished responsiveness.  Sternal rub responsiveness at this time my evaluation..  ABG was done which showed hypercapnia.  BiPAP was initiated.  Patient was signed out back to the critical care team.  Spoke with Dr. Francine Graven PCCM.

## 2021-04-30 NOTE — Progress Notes (Signed)
Paged hospitalist. Patient lethargic, responds to sternal rub. Awaiting for MD to call. Continue to assess.

## 2021-04-30 NOTE — Progress Notes (Signed)
PHARMACY CONSULT NOTE  Pharmacy Consult for Electrolyte Monitoring and Replacement   Recent Labs: Potassium (mmol/L)  Date Value  04/30/2021 3.7   Magnesium (mg/dL)  Date Value  85/88/5027 1.7   Calcium (mg/dL)  Date Value  74/07/8785 8.5 (L)   Albumin (g/dL)  Date Value  76/72/0947 2.9 (L)   Phosphorus (mg/dL)  Date Value  09/62/8366 3.1   Sodium (mmol/L)  Date Value  04/30/2021 130 (L)   Assessment: Patient is a 65 y/o M with medical history including COPD, CAD, HTN, HLD, schizoaffective disorder / bipolar 1, seizures, EtOH abuse, history of suicide attempt who is admitted with acute on chronic respiratory failure. Patient was briefly intubated but has now been extubated and is on nasal cannula. Pharmacy consulted to assist with electrolyte monitoring and replacement as indicated.  Scr 1.15 >> 0.67 >> 0.63  Plan:  --Hypotonic hypovolemic hyponatremia. Sodium improved to 130 today with NS resuscitation --Mg 1.7, will give IV magnesium sulfate 2 g x 1 dose --Patient care transitioned from PCCM to [facility]. Will discontinue electrolyte consult at this time. Defer further electrolyte replacement and ordering of labs to hospitalist  Tressie Ellis 04/30/2021 8:08 AM

## 2021-04-30 NOTE — Progress Notes (Signed)
Brandon [name]- Mentation is the same. Sternal rub-he will open eyes- moans and groans. Not following command ABG obtained, placed on bi-pap, chest xray performed. ICU MD consulted. Continue to assess.

## 2021-04-30 NOTE — Progress Notes (Signed)
PT Cancellation Note  Patient Details Name: Brandon [name] MRN: [id number] DOB: 06-May-1956   Cancelled Treatment:    Reason Eval/Treat Not Completed: Medical issues which prohibited therapy (Patient noted with decline in respiratory status and reintubation this date. Will complete current PT order; please re-consult as medically appropriate.)   Mattisen Pohlmann H. Manson Passey, PT, DPT, NCS 04/30/21, 10:31 PM [phone]

## 2021-04-30 NOTE — Progress Notes (Signed)
SLP Cancellation Note  Patient Details Name: Brandon [name] MRN: [id number] DOB: [birth year]   Cancelled treatment:       Reason Eval/Treat Not Completed: Medical issues which prohibited therapy;Patient not medically ready (chart reviewed; consulted NSG re: pt's status currently). NSG and chart reported pt has had Pulmonary decline since this morning. Pt is now on BiPAP. Pt has a baseline of Psychiatric illness followed by Psychiatry currently; "pt has history of schizoaffective disorder who is in the hospital with hyponatremia and syncope. Question about clozapine dosing.". Due to pt's currently Pulmonary presentation and risk for aspiration, the oral diet was recommended discontinued until ST services can evaluate tomorrow. NPO status placed in chart -- including meds is recommended. NSG agreed.  ST services will f/u in the morning. Recommend oral care when able (d/t BiPAP) for hygiene and stimulation of swallowing.      Jerilynn Som, MS, CCC-SLP Speech Language Pathologist Rehab Services [phone] [facility] 04/30/2021, 3:07 PM

## 2021-04-30 NOTE — Progress Notes (Signed)
ET pulled back to 25 at lip per MD request based on CXR result

## 2021-04-30 NOTE — Consult Note (Signed)
Brandon [name] is a 65 y.o. male  [id number]  Primary Cardiologist: Adrian Blackwater, MD  Reason for Consultation: orthostatic hypotension  HPI: 65 year old male presented from [facility] via EMS for unresponsiveness, hypotensive in the 50s. Blood pressure improved with IV fluids.    Review of Systems: patient sleeping, now on BiPAP. No previous complaints of chest pain, shortness of breath at this hospital admission.    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     atorvastatin (LIPITOR) 20 MG tablet Take 1 tablet (20 mg total) by mouth at bedtime. 30 tablet 0   bisacodyl (DULCOLAX) 5 MG EC tablet Take 5 mg by mouth once.     budesonide-formoterol (SYMBICORT) 80-4.5 MCG/ACT inhaler Inhale 2 puffs into the lungs 2 (two) times daily.     buPROPion (WELLBUTRIN SR) 100 MG 12 hr tablet Take 100 mg by mouth daily.      carvedilol (COREG) 25 MG tablet Take 25 mg by mouth 2 (two) times daily with a meal.     cholecalciferol (VITAMIN D) 25 MCG tablet Take 1 tablet (1,000 Units total) by mouth daily. 30 tablet 1   cloZAPine (CLOZARIL) 100 MG tablet Take 150 mg by mouth at bedtime.     docusate sodium (COLACE) 100 MG capsule Take 1 capsule (100 mg total) by mouth 2 (two) times daily. Hold if has diarrhea 60 capsule 0   dorzolamide-timolol (COSOPT) 22.3-6.8 MG/ML ophthalmic solution Place 1 drop into both eyes 2 (two) times daily.      ezetimibe (ZETIA) 10 MG tablet Take 10 mg by mouth at bedtime.      furosemide (LASIX) 20 MG tablet Take 0.5 tablets (10 mg total) by mouth daily. 30 tablet 0   hydrOXYzine (VISTARIL) 25 MG capsule Take 1 capsule (25 mg total) by mouth 3 (three) times daily as needed for anxiety. 30 capsule 0   isosorbide mononitrate (IMDUR) 30 MG 24 hr tablet Take 30 mg by mouth 2 (two) times daily.      latanoprost (XALATAN) 0.005 % ophthalmic solution Place 1 drop into both eyes at bedtime.     nitroGLYCERIN (NITROSTAT) 0.4 MG SL tablet Place 0.4 mg under the tongue every 5 (five) minutes as needed for chest pain.      olmesartan (BENICAR) 20 MG tablet Take 20 mg by mouth daily.     pantoprazole (PROTONIX) 40 MG tablet Take 40 mg by mouth 2 (two) times daily.     polyethylene glycol (MIRALAX / GLYCOLAX) 17 g packet Take 17 g by mouth daily. 14 each 0   senna (SENOKOT) 8.6 MG tablet Take 1-2 tablets by mouth daily as needed for constipation.     sodium chloride 1 g tablet Take 1 tablet (1 g total) by  mouth 3 (three) times daily with meals. 30 tablet 0   tamsulosin (FLOMAX) 0.4 MG CAPS capsule Take 0.4 mg by mouth daily.      albuterol (VENTOLIN HFA) 108 (90 Base) MCG/ACT inhaler Inhale 2 puffs into the lungs every 6 (six) hours as needed for wheezing or shortness of breath. 8 g 0      acetylcysteine  4 mL Nebulization BID   arformoterol  15 mcg Nebulization BID   atorvastatin  20 mg Oral QHS   buPROPion ER  100 mg Oral Daily   Chlorhexidine Gluconate Cloth  6 each Topical Daily   cholecalciferol  1,000 Units Oral Daily   docusate sodium  100 mg Oral BID   dorzolamide-timolol  1 drop Both Eyes BID   enoxaparin (LOVENOX) injection  40 mg Subcutaneous Q24H   ezetimibe  10 mg Oral QHS   latanoprost  1 drop Both Eyes QHS   methylPREDNISolone (SOLU-MEDROL) injection  40 mg Intravenous Daily   pantoprazole  40 mg Oral BID   revefenacin  175 mcg Nebulization Daily   tamsulosin  0.4 mg Oral Daily     Infusions:   Allergies  Allergen Reactions   Compazine [Prochlorperazine] Other (See Comments)    Dystonic rxn - convulsions. Near fatal reaction.    Ivp Dye [Iodinated Diagnostic Agents] Shortness Of Breath    Pt states SOB after last IV contrast injection   Alcohol-Sulfur [Elemental Sulfur] Other (See Comments)    History of alcoholism   Benadryl [Diphenhydramine] Other (See Comments)    "stuffy", nasal congestion   Depakote [Divalproex Sodium] Other (See Comments)    Cause elevated ammonia   Dramamine [Dimenhydrinate] Swelling   Plavix [Clopidogrel] Other (See Comments)    Rectal bleeding. "Perforated my intestines."   Rosuvastatin     Other reaction(s): Other (See Comments)    Social History   Socioeconomic History   Marital status: Married    Spouse name: Not on file   Number of children: Not on file   Years of education: Not on file   Highest education level: Not on file  Occupational History   Not on file  Tobacco Use   Smoking status: Former    Packs/day: 1.00    Types: Cigarettes    Quit date: 2012    Years since quitting: 10.7   Smokeless tobacco: Former  Building services engineer Use: Never used  Substance and Sexual Activity   Alcohol use: No    Comment: no alcohol since 2010   Drug use: No   Sexual activity: Not on file  Other Topics Concern   Not on file  Social History Narrative   ** Merged History Encounter **       Social Determinants of Health   Financial Resource Strain: Not on file  Food Insecurity: Not on file  Transportation Needs: Not on file  Physical Activity: Not on file  Stress: Not on file  Social Connections: Not on file  Intimate Partner Violence: Not on file    Family History  Problem Relation Age of Onset   Lung cancer Mother    Hypertension Father    Heart attack Father    CAD Father    Prostate cancer Neg Hx    Bladder Cancer Neg Hx    Kidney cancer Neg Hx     PHYSICAL EXAM: Vitals:   04/30/21 1200 04/30/21  1412  BP: (!) 153/87   Pulse: 86   Resp: (!) 23 (!) 25  Temp: 98.1 F (36.7 C)   SpO2: 99%      Intake/Output Summary (Last 24 hours) at 04/30/2021 1444 Last data filed at 04/30/2021 0658 Gross per 24 hour  Intake 695.49 ml  Output 1450 ml  Net -754.51 ml    General:  Well appearing. No respiratory difficulty HEENT: normal Neck: supple. no JVD. Carotids 2+ bilat; no bruits. No lymphadenopathy or thryomegaly appreciated. Cor: PMI nondisplaced. Regular rate & rhythm. No rubs, gallops or murmurs. Lungs: clear Abdomen: soft, nontender, nondistended. No hepatosplenomegaly. No bruits or masses. Good bowel sounds. Extremities: no cyanosis, clubbing, rash, edema Neuro: alert & oriented x 3, cranial nerves grossly intact. moves all 4 extremities w/o difficulty. Affect pleasant.  ECG: Normal sinus rhythm, HR 78 bpm  Results for orders placed or performed during the hospital encounter of 04/28/21 (from the past 24 hour(s))  Glucose, capillary     Status: Abnormal   Collection Time: 04/29/21  3:24 PM  Result Value Ref Range   Glucose-Capillary 100 (H) 70 - 99 mg/dL  Glucose, capillary     Status: Abnormal   Collection Time: 04/29/21  8:45 PM  Result Value Ref Range   Glucose-Capillary 107 (H) 70 - 99 mg/dL  Sodium     Status: Abnormal   Collection Time: 04/29/21  9:27 PM  Result Value Ref Range   Sodium 129 (L) 135 - 145 mmol/L  Glucose, capillary     Status: None   Collection Time: 04/30/21 12:50 AM  Result Value Ref Range   Glucose-Capillary 92 70 - 99 mg/dL  Procalcitonin     Status: None   Collection Time: 04/30/21  1:48 AM  Result Value Ref Range   Procalcitonin <0.10 ng/mL  Basic metabolic panel     Status: Abnormal   Collection Time: 04/30/21  1:48 AM  Result Value Ref Range   Sodium 130 (L) 135 - 145 mmol/L   Potassium 3.7 3.5 - 5.1 mmol/L   Chloride 97 (L) 98 - 111 mmol/L   CO2 28 22 - 32 mmol/L   Glucose, Bld 95 70 - 99 mg/dL   BUN 7 (L) 8 - 23 mg/dL    Creatinine, Ser 5.73 0.61 - 1.24 mg/dL   Calcium 8.5 (L) 8.9 - 10.3 mg/dL   GFR, Estimated >22 >02 mL/min   Anion gap 5 5 - 15  CBC with Differential/Platelet     Status: Abnormal   Collection Time: 04/30/21  1:48 AM  Result Value Ref Range   WBC 8.2 4.0 - 10.5 K/uL   RBC 4.01 (L) 4.22 - 5.81 MIL/uL   Hemoglobin 11.7 (L) 13.0 - 17.0 g/dL   HCT 54.2 (L) 70.6 - 23.7 %   MCV 87.5 80.0 - 100.0 fL   MCH 29.2 26.0 - 34.0 pg   MCHC 33.3 30.0 - 36.0 g/dL   RDW 62.8 31.5 - 17.6 %   Platelets 302 150 - 400 K/uL   nRBC 0.0 0.0 - 0.2 %   Neutrophils Relative % 74 %   Neutro Abs 6.1 1.7 - 7.7 K/uL   Lymphocytes Relative 16 %   Lymphs Abs 1.3 0.7 - 4.0 K/uL   Monocytes Relative 9 %   Monocytes Absolute 0.8 0.1 - 1.0 K/uL   Eosinophils Relative 0 %   Eosinophils Absolute 0.0 0.0 - 0.5 K/uL   Basophils Relative 0 %   Basophils Absolute 0.0 0.0 - 0.1 K/uL   Immature Granulocytes 1 %   Abs Immature Granulocytes 0.05 0.00 - 0.07  K/uL  Magnesium     Status: None   Collection Time: 04/30/21  1:48 AM  Result Value Ref Range   Magnesium 1.7 1.7 - 2.4 mg/dL  Phosphorus     Status: None   Collection Time: 04/30/21  1:48 AM  Result Value Ref Range   Phosphorus 3.1 2.5 - 4.6 mg/dL  Glucose, capillary     Status: None   Collection Time: 04/30/21  4:28 AM  Result Value Ref Range   Glucose-Capillary 93 70 - 99 mg/dL  Sodium     Status: Abnormal   Collection Time: 04/30/21  5:41 AM  Result Value Ref Range   Sodium 130 (L) 135 - 145 mmol/L  Glucose, capillary     Status: None   Collection Time: 04/30/21  8:17 AM  Result Value Ref Range   Glucose-Capillary 99 70 - 99 mg/dL  Glucose, capillary     Status: Abnormal   Collection Time: 04/30/21 11:17 AM  Result Value Ref Range   Glucose-Capillary 106 (H) 70 - 99 mg/dL  Blood gas, arterial     Status: Abnormal   Collection Time: 04/30/21  1:35 PM  Result Value Ref Range   FIO2 0.30    Delivery systems NASAL CANNULA    pH, Arterial 7.35 7.350 -  7.450   pCO2 arterial 56 (H) 32.0 - 48.0 mmHg   pO2, Arterial 78 (L) 83.0 - 108.0 mmHg   Bicarbonate 30.9 (H) 20.0 - 28.0 mmol/L   Acid-Base Excess 4.0 (H) 0.0 - 2.0 mmol/L   O2 Saturation 94.7 %   Patient temperature 37.0    Collection site RIGHT RADIAL    Sample type ARTERIAL DRAW    Allens test (pass/fail) POSITIVE (A) PASS   CT HEAD WO CONTRAST ( )  Result Date: 04/28/2021 CLINICAL DATA:  Mental status change of unknown cause. Unresponsiveness. EXAM: CT HEAD WITHOUT CONTRAST CT CERVICAL SPINE WITHOUT CONTRAST TECHNIQUE: Multidetector CT imaging of the head and cervical spine was performed following the standard protocol without intravenous contrast. Multiplanar CT image reconstructions of the cervical spine were also generated. COMPARISON:  CT head 08/30/2009. CT neck 08/31/2005. MRI brain 04/05/2021 FINDINGS: CT HEAD FINDINGS Brain: No evidence of acute infarction, hemorrhage, hydrocephalus, extra-axial collection or mass lesion/mass effect. Mild cerebral atrophy. Vascular: Moderate intracranial arterial vascular calcifications. Skull: Calvarium appears intact. Sinuses/Orbits: Air-fluid level in the left maxillary antrum may indicate sinusitis. Mastoid air cells are clear. Other: None. CT CERVICAL SPINE FINDINGS Alignment: Reversal of the usual cervical lordosis. This is likely positional but could indicate muscle spasm. Skull base and vertebrae: Skull base appears intact. No vertebral compression deformities. No focal bone lesion or bone destruction. Degenerative changes in the temporomandibular joints. Multiple previous tooth extractions with periodontal lucencies consistent with periodontal disease. Soft tissues and spinal canal: No prevertebral fluid or swelling. No visible canal hematoma. Disc levels: Prominent degenerative changes with narrowed interspaces and endplate osteophyte formation throughout the cervical spine. Degenerative changes in the facet joints. Uncovertebral spurring at  multiple levels. Upper chest: Lung apices are clear. Other: None. IMPRESSION: 1. No acute intracranial abnormalities.  Mild cerebral atrophy. 2. Nonspecific reversal of the usual cervical lordosis. Diffuse degenerative change in the cervical spine. No acute displaced fractures. 3. Air-fluid level in the left maxillary antrum may indicate sinusitis. Electronically Signed   By: Burman Nieves M.D.   On: 04/28/2021 19:34   CT Cervical Spine Wo Contrast  Result Date: 04/28/2021 CLINICAL DATA:  Mental status change of unknown cause. Unresponsiveness. EXAM: CT  HEAD WITHOUT CONTRAST CT CERVICAL SPINE WITHOUT CONTRAST TECHNIQUE: Multidetector CT imaging of the head and cervical spine was performed following the standard protocol without intravenous contrast. Multiplanar CT image reconstructions of the cervical spine were also generated. COMPARISON:  CT head 08/30/2009. CT neck 08/31/2005. MRI brain 04/05/2021 FINDINGS: CT HEAD FINDINGS Brain: No evidence of acute infarction, hemorrhage, hydrocephalus, extra-axial collection or mass lesion/mass effect. Mild cerebral atrophy. Vascular: Moderate intracranial arterial vascular calcifications. Skull: Calvarium appears intact. Sinuses/Orbits: Air-fluid level in the left maxillary antrum may indicate sinusitis. Mastoid air cells are clear. Other: None. CT CERVICAL SPINE FINDINGS Alignment: Reversal of the usual cervical lordosis. This is likely positional but could indicate muscle spasm. Skull base and vertebrae: Skull base appears intact. No vertebral compression deformities. No focal bone lesion or bone destruction. Degenerative changes in the temporomandibular joints. Multiple previous tooth extractions with periodontal lucencies consistent with periodontal disease. Soft tissues and spinal canal: No prevertebral fluid or swelling. No visible canal hematoma. Disc levels: Prominent degenerative changes with narrowed interspaces and endplate osteophyte formation throughout  the cervical spine. Degenerative changes in the facet joints. Uncovertebral spurring at multiple levels. Upper chest: Lung apices are clear. Other: None. IMPRESSION: 1. No acute intracranial abnormalities.  Mild cerebral atrophy. 2. Nonspecific reversal of the usual cervical lordosis. Diffuse degenerative change in the cervical spine. No acute displaced fractures. 3. Air-fluid level in the left maxillary antrum may indicate sinusitis. Electronically Signed   By: Burman Nieves M.D.   On: 04/28/2021 19:34   DG Chest Port 1 View  Result Date: 04/29/2021 CLINICAL DATA:  Shortness of breath EXAM: PORTABLE CHEST 1 VIEW COMPARISON:  04/28/2021 FINDINGS: Endotracheal tube and gastric catheter have been removed in the interval. Cardiac shadow is stable. Aortic calcifications are seen. Mild atelectatic changes are noted in the right base. Left lung is clear. No bony abnormality is noted. IMPRESSION: Mild right basilar atelectasis. Electronically Signed   By: Alcide Clever M.D.   On: 04/29/2021 20:19   DG Chest Portable 1 View  Result Date: 04/28/2021 CLINICAL DATA:  Endotracheal tube placement EXAM: PORTABLE CHEST 1 VIEW COMPARISON:  04/28/2021 9:10 p.m. FINDINGS: Endotracheal tube approximately 5.4 cm above the carina. Enteric tube side port overlies the stomach, with tip off the inferior field of view. The heart size and mediastinal contours are within normal limits. No focal pulmonary opacity. No pleural effusion or pneumothorax. No acute osseous abnormality. IMPRESSION: Repositioning of the endotracheal tube tip, which is in the midthoracic trachea. Electronically Signed   By: Wiliam Ke M.D.   On: 04/28/2021 21:43   DG Chest Portable 1 View  Result Date: 04/28/2021 CLINICAL DATA:  Endotracheal tube placement EXAM: PORTABLE CHEST 1 VIEW COMPARISON:  04/28/2021 FINDINGS: Support Apparatus: --Endotracheal tube: Tip at the level of the clavicular heads. --Enteric tube:Tip and sideport project over the  stomach. --Vascular catheter(s):None --Other: None Right basilar atelectasis. No focal airspace consolidation or pulmonary edema. IMPRESSION: Endotracheal tube tip at the level of the clavicular heads. Electronically Signed   By: Deatra Robinson M.D.   On: 04/28/2021 21:33   DG Chest Portable 1 View  Result Date: 04/28/2021 CLINICAL DATA:  Questionable sepsis. Evaluate for abnormality. Unresponsiveness. EXAM: PORTABLE CHEST 1 VIEW COMPARISON:  04/15/2021 FINDINGS: Shallow inspiration. Heart size and pulmonary vascularity are normal. Linear atelectasis or fibrosis in the left mid lung and right lung base. No focal consolidation. No pleural effusions. No pneumothorax. Calcification of the aorta. Degenerative changes in the spine and shoulders. IMPRESSION: Linear atelectasis  or fibrosis in the left mid lung and right lung base. No focal consolidation. Electronically Signed   By: Burman Nieves M.D.   On: 04/28/2021 19:48   DG Abd Portable 1 View  Result Date: 04/28/2021 CLINICAL DATA:  Orogastric tube placement EXAM: PORTABLE ABDOMEN - 1 VIEW COMPARISON:  None. FINDINGS: Tip and side port of esophageal catheter project within the stomach. IMPRESSION: Esophageal catheter tip in the stomach. Electronically Signed   By: Deatra Robinson M.D.   On: 04/28/2021 21:34     ASSESSMENT AND PLAN: Patient experiencing orthostatic hypotension. Home cardiac medications on hold due to low b/p and orthostatic hypotension. Will continue to monitor and will restart Benicar when blood pressure improved.   Museum/gallery conservator FNP-C

## 2021-04-30 NOTE — Progress Notes (Signed)
Pt unable to urinate since foley removed earlier in the day, bladder scan showed 861 ml in bladder. Provider notified. In and out cath done. 775 ml amber, clear urine drained from bladder. Pt tolerated well.

## 2021-04-30 NOTE — Progress Notes (Signed)
MD in to see patient. ABG reviewed, orders for Bi-pap continue to monitor.

## 2021-04-30 NOTE — Progress Notes (Signed)
[facility]  Pulling at tubes, lines despite fentanyl pushes Will order precedex infusion  Heber Selden, MD [facility] Pager: [phone] Cell: [phone] After 7:00 pm call Elink  [phone]

## 2021-05-01 DIAGNOSIS — F250 Schizoaffective disorder, bipolar type: Secondary | ICD-10-CM | POA: Diagnosis not present

## 2021-05-01 LAB — COMPREHENSIVE METABOLIC PANEL
ALT: 16 U/L (ref 0–44)
AST: 19 U/L (ref 15–41)
Albumin: 2.7 g/dL — ABNORMAL LOW (ref 3.5–5.0)
Alkaline Phosphatase: 81 U/L (ref 38–126)
Anion gap: 7 (ref 5–15)
BUN: 11 mg/dL (ref 8–23)
CO2: 29 mmol/L (ref 22–32)
Calcium: 8.8 mg/dL — ABNORMAL LOW (ref 8.9–10.3)
Chloride: 94 mmol/L — ABNORMAL LOW (ref 98–111)
Creatinine, Ser: 0.53 mg/dL — ABNORMAL LOW (ref 0.61–1.24)
GFR, Estimated: >60 mL/min (ref >60)
Glucose, Bld: 110 mg/dL — ABNORMAL HIGH (ref 70–99)
Potassium: 4 mmol/L (ref 3.5–5.1)
Sodium: 130 mmol/L — ABNORMAL LOW (ref 135–145)
Total Bilirubin: 0.8 mg/dL (ref 0.3–1.2)
Total Protein: 5.2 g/dL — ABNORMAL LOW (ref 6.5–8.1)

## 2021-05-01 LAB — MAGNESIUM: Magnesium: 1.8 mg/dL (ref 1.7–2.4)

## 2021-05-01 LAB — GLUCOSE, CAPILLARY
Glucose-Capillary: 110 mg/dL — ABNORMAL HIGH (ref 70–99)
Glucose-Capillary: 125 mg/dL — ABNORMAL HIGH (ref 70–99)
Glucose-Capillary: 98 mg/dL (ref 70–99)
Glucose-Capillary: 130 mg/dL — ABNORMAL HIGH (ref 70–99)
Glucose-Capillary: 122 mg/dL — ABNORMAL HIGH (ref 70–99)

## 2021-05-01 MED ORDER — EZETIMIBE 10 MG PO TABS
10.0000 mg | ORAL_TABLET | Freq: Every day | ORAL | Status: DC
  Administered 2021-05-01 – 2021-05-16 (×15): 10 mg via ORAL
  Filled 2021-05-01 (×16): qty 1

## 2021-05-01 MED ORDER — ADULT MULTIVITAMIN W/MINERALS CH
1.0000 | ORAL_TABLET | Freq: Every day | ORAL | Status: DC
  Administered 2021-05-02 – 2021-05-17 (×16): 1 via ORAL
  Filled 2021-05-01 (×16): qty 1

## 2021-05-01 MED ORDER — PANTOPRAZOLE SODIUM 40 MG PO TBEC
40.0000 mg | DELAYED_RELEASE_TABLET | Freq: Every day | ORAL | Status: DC
  Administered 2021-05-02 – 2021-05-17 (×16): 40 mg via ORAL
  Filled 2021-05-01 (×16): qty 1

## 2021-05-01 MED ORDER — CLOZAPINE 25 MG PO TABS
25.0000 mg | ORAL_TABLET | Freq: Every day | ORAL | Status: DC
  Administered 2021-05-01 – 2021-05-04 (×3): 25 mg via ORAL
  Filled 2021-05-01 (×4): qty 1

## 2021-05-01 MED ORDER — ISOSORBIDE MONONITRATE ER 30 MG PO TB24
30.0000 mg | ORAL_TABLET | Freq: Two times a day (BID) | ORAL | Status: DC
  Administered 2021-05-01 – 2021-05-09 (×15): 30 mg via ORAL
  Filled 2021-05-01 (×14): qty 1

## 2021-05-01 MED ORDER — ENSURE ENLIVE PO LIQD
237.0000 mL | Freq: Three times a day (TID) | ORAL | Status: DC
  Administered 2021-05-01 – 2021-05-09 (×15): 237 mL via ORAL

## 2021-05-01 MED ORDER — GUAIFENESIN-DM 100-10 MG/5ML PO SYRP
5.0000 mL | ORAL_SOLUTION | ORAL | Status: DC | PRN
  Administered 2021-05-01 – 2021-05-02 (×3): 5 mL via ORAL
  Filled 2021-05-01 (×3): qty 5

## 2021-05-01 MED ORDER — ATORVASTATIN CALCIUM 20 MG PO TABS
20.0000 mg | ORAL_TABLET | Freq: Every day | ORAL | Status: DC
  Administered 2021-05-01 – 2021-05-10 (×9): 20 mg via ORAL
  Filled 2021-05-01 (×9): qty 1

## 2021-05-01 MED ORDER — VITAMIN D 25 MCG (1000 UNIT) PO TABS
1000.0000 [IU] | ORAL_TABLET | Freq: Every day | ORAL | Status: DC
  Administered 2021-05-02 – 2021-05-17 (×16): 1000 [IU] via ORAL
  Filled 2021-05-01 (×16): qty 1

## 2021-05-01 MED ORDER — IRBESARTAN 150 MG PO TABS
150.0000 mg | ORAL_TABLET | Freq: Every day | ORAL | Status: DC
  Administered 2021-05-02 – 2021-05-08 (×7): 150 mg via ORAL
  Filled 2021-05-01 (×7): qty 1

## 2021-05-01 NOTE — Progress Notes (Signed)
Patient alert and oriented on vent following all commands.  Patient pulling good volumes in spontaneous mode on vent.  Dr. Isaiah Serge assessed patient and gave order to extubate.

## 2021-05-01 NOTE — Progress Notes (Signed)
Patient alert on vent.  Patient able to lift head off of pillow, stick his tongue out, and nods appropriately.  Precedex drip decreased by 50% for spontaneous breathing trial.

## 2021-05-01 NOTE — Progress Notes (Signed)
eLink Physician-Brief Progress Note Patient Name: Brandon [name] DOB: 1956-04-13 MRN: [id number]   Date of Service  05/01/2021  HPI/Events of Note  Hypertension - BP = 173/108.  eICU Interventions  Will restart home antihypertensives: Imdur 30 mg PO every 12 hours. Hold dose for SBP < 110. Irbesartan 150 mg PO Q day. Formulary recommended substitute for Benicar.     Intervention Category Major Interventions: Hypertension - evaluation and management  Daijanae Rafalski Eugene 05/01/2021, 9:05 PM

## 2021-05-01 NOTE — Progress Notes (Signed)
NAME:  Brandon [name], MRN:  [id number], DOB:  1955/12/26, LOS: 3 ADMISSION DATE:  04/28/2021, CONSULTATION DATE:  04/28/21 REFERRING MD:  Dr. Roxan Hockey, CHIEF COMPLAINT: Altered mental status  History of Present Illness:  65 year old male presenting to [facility] ED on 04/28/2021 from SN[facility] via EMS.  Staff at [facility] called EMS after patient was found to be unresponsive and reportedly hypotensive in the 50s for an unknown amount of time.  Staff had stated earlier they thought the patient had been napping.  EMS started IV fluids and continued chronic oxygen in route, with reports of blood pressure improvement.  Per ED documentation upon arrival patient responsive to sternal rub, altered and not following commands. ED course: Patient initially appeared to improve with IV fluids and was protecting his airway.  However within 2 hours after arrival patient became hypoxic with SPO2 in the 70s and was too confused to safely tolerate BiPAP requiring emergent intubation and mechanical ventilatory support.  Of note this is the patient's sixth hospital admission in 2022 for a combination of hyponatremia/hypoxia with or without unresponsiveness.  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 Initial vitals: Afebrile at 96.4, tachypneic at 26, NSR at 79, BP marginal at 95/69 with SPO2 99% on 4 L nasal cannula.  Significant labs: Labs/ Imaging personally reviewed CXR 04/28/2021 (before intubation): Linear atelectasis or fibrosis in the left midlung and right lung base no focal consolidation. CT head without contrast & CT cervical spine without contrast 04/28/2021: No acute intracranial abnormalities, mild cerebral atrophy.  Diffuse degenerative change in the cervical spine no acute displaced fractures.  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 04/29/2021-Pt successfully extubated to 4L O2 via nasal canula  04/30/2021 - Patient becoming more lethargic this morning, became unarousable with ABG showing hypercapnia, PCCM re-consulted.  Reintubated.  Precedex started briefly overnight  Interim History / Subjective:   He is wide-awake today, breathing well on PSV.  Alert, oriented.  Wants the tube out Precedex is off  Objective   Blood pressure (!) 159/95, pulse 64, temperature 97.9 F (36.6 C), temperature source Oral, resp. rate 20, weight 86.4 kg, SpO2 100 %.    Vent Mode: PRVC FiO2 (%):  [30 %] 30 % Set Rate:  [20 bmp] 20 bmp Vt Set:  [500 mL] 500 mL PEEP:  [10 cmH20] 10 cmH20 Plateau Pressure:  [22 cmH20-26 cmH20] 22 cmH20   Intake/Output Summary (Last 24 hours) at 05/01/2021 0944 Last data filed at 05/01/2021 0934 Gross per 24 hour  Intake 371.93 ml  Output 1550 ml  Net -1178.07 ml   Filed Weights   04/28/21 1901 04/28/21 2057 04/29/21 0213  Weight: 82.6 kg 85.2 kg 86.4 kg    Examination: Gen:      No acute distress HEENT:  EOMI, sclera anicteric Neck:     No masses; no thyromegaly, ETT Lungs:    Clear to auscultation bilaterally; normal respiratory effort CV:         Regular rate and rhythm; no murmurs Abd:      + bowel sounds; soft, non-tender;  no palpable masses, no distension Ext:    No edema; adequate peripheral perfusion Skin:      Warm and dry; no rash Neuro: alert and oriented x 3  Lab/imaging reviewed Significant for Sodium 130, creatinine 0.53 No new imaging  Resolved Hospital  Problem list     Assessment & Plan:  Acute Encephalopathy multifactorial in the setting of hypoxia and possibly medication induced (psychiatric and/or antihypertensive  Possible syncopal episodes  PMHx: Schizophrenia, Seizures (ETOH withdrawal related), ETOH abuse UDS + for tricyclics (however upon Rx consultation Wellbutrin can cause + tricyclic result). CTH/Cervical Spine negative for abnormality - Psych consulted and clozaril restarted 9/19 at 100mg .  He then had to be reintubated the next day for obtundation.  Concerned that this medication is causing excessive sedation.  We will discontinue this for now - Continue outpatient wellbutrin sr and prn vistaril  - Frequent reorientation  - PT/OT consult   Acute on chronic combined hypoxic & Hypercapnic Respiratory Failure secondary to suspected aspiration in the setting of acute encephalopathy Suspected Aspiration Pneumonia PMHx: COPD Looks good on weaning trial today and mental status is normal Plan for extubation Continue bronchodilators, Solu-Medrol  Orthostatic Hypotension - Cardiology notified about patient's orthostasis and recommendation on his outpatient regimen.  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  He states his brother does have a  wife, however she currently resides at a SNF and he does not have her contact info.  He does state his brother has informed him he would like him to be his HCPOA in the event the pt is unable to make decisions for himself.  Pts RN has consulted [facility] to assist the pt and pts brother Brandon [name] with completing the POA paperwork.   Critical care time:    The patient is critically ill with multiple organ system failure and requires high complexity decision making for assessment and support, frequent evaluation and titration of therapies, advanced monitoring, review of radiographic studies and interpretation of complex data.   Critical Care Time devoted to patient care services, exclusive of separately billable procedures, described in this note is 35 minutes.   Roosevelt Locks MD [facility] See Amion for pager  If no response to pager , please call [phone] until 7pm After 7:00 pm call Elink  [phone] 05/01/2021, 9:45 AM

## 2021-05-01 NOTE — Progress Notes (Signed)
Chaplain Maggie made follow up visit at bedside. Pt was considering his "musings". He expressed thinking of his family and those in his community. He appreciated the visitation. Continued support available per on call chaplain.

## 2021-05-01 NOTE — Progress Notes (Signed)
Contacted Elink about BP 173/108. Has PRN Hydralazine, but after viewing his home bp meds, he has some home meds that may need to be reviewed and restarted. Notified and spoke w/ Vinnie Langton, RN/[facility]. See new orders.

## 2021-05-01 NOTE — Progress Notes (Signed)
[facility] [place], [place] 05/01/21  Subjective:   Hospital day # 3  Patient known to our practice from previous admissions This time he is brought to the emergency room for evaluation of unresponsiveness.  Apparently patient is resident at [facility].  Staff called EMS because patient had decreased responsiveness and blood pressure was in the 50s. According to ER notes, patient was given IV fluid resuscitation Nephrology consult has been requested for evaluation of hyponatremia Usual sodium level of 136 from April 16, 2021 Sodium was 122 when patient presented to the emergency room. Patient required brief intubation during this hospitalization  With IV fluids, his sodium as well as serum creatinine have improved  Sodium of 130 this morning  Renal: 09/20 0701 - 09/21 0700 In: 249.6 [I.V.:99.6; NG/GT:150] Out: 1550 [Urine:1550] Lab Results  Component Value Date   CREATININE 0.53 (L) 05/01/2021   CREATININE 0.63 04/30/2021   CREATININE 0.67 04/29/2021     Objective:  Vital signs in last 24 hours:  Temp:  [97.9 F (36.6 C)-99 F (37.2 C)] 97.9 F (36.6 C) (09/21 0745) Pulse Rate:  [59-117] 64 (09/21 0715) Resp:  [17-27] 20 (09/21 0715) BP: (78-189)/(59-123) 159/95 (09/21 0700) SpO2:  [95 %-100 %] 100 % (09/21 0715) FiO2 (%):  [30 %] 30 % (09/21 0715)  Weight change:  Filed Weights   04/28/21 1901 04/28/21 2057 04/29/21 0213  Weight: 82.6 kg 85.2 kg 86.4 kg    Intake/Output:    Intake/Output Summary (Last 24 hours) at 05/01/2021 1026 Last data filed at 05/01/2021 0934 Gross per 24 hour  Intake 371.93 ml  Output 1550 ml  Net -1178.07 ml      Physical Exam: General: Sitting up in the bed  HEENT Moist oral mucous membranes  Pulm/lungs Normal breathing effort, clear to auscultation  CVS/Heart regular, no rub  Abdomen:  Soft, nontender  Extremities: No edema  Neurologic: able to answer few simple questions,  Skin:  Warm, dry          Basic Metabolic Panel:  Recent Labs  Lab 04/28/21 1905 04/29/21 0113 04/29/21 0536 04/29/21 2127 04/30/21 0148 04/30/21 0541 05/01/21 0504  NA 122*   < > 127* 129* 130* 130* 130*  K 4.5  --  3.9  --  3.7  --  4.0  CL 86*  --  92*  --  97*  --  94*  CO2 28  --  28  --  28  --  29  GLUCOSE 129*  --  115*  --  95  --  110*  BUN 7*  --  7*  --  7*  --  11  CREATININE 1.15  --  0.67  --  0.63  --  0.53*  CALCIUM 8.4*  --  8.9  --  8.5*  --  8.8*  MG  --   --  1.9  --  1.7  --  1.8  PHOS  --   --  3.4  --  3.1  --   --    < > = values in this interval not displayed.      CBC: Recent Labs  Lab 04/28/21 1905 04/29/21 0536 04/30/21 0148  WBC 9.6 8.3 8.2  NEUTROABS 7.6  --  6.1  HGB 10.9* 13.2 11.7*  HCT 32.5* 40.6 35.1*  MCV 87.8 86.8 87.5  PLT 266 303 302      No results found for: HEPBSAG, HEPBSAB, HEPBIGM    Microbiology:  Recent Results (from the past 240 hour(s))  Resp Panel by RT-PCR (Flu A&B, Covid) Nasopharyngeal Swab     Status: None   Collection Time: 04/28/21  7:05 PM   Specimen: Nasopharyngeal Swab; Nasopharyngeal(NP) swabs in vial transport medium  Result Value Ref Range Status   SARS Coronavirus 2 by RT PCR NEGATIVE NEGATIVE Final    Comment: (NOTE) SARS-CoV-2 target nucleic acids are NOT DETECTED.  The SARS-CoV-2 RNA is generally detectable in upper respiratory specimens during the acute phase of infection. The lowest concentration of SARS-CoV-2 viral copies this assay can detect is 138 copies/mL. A negative result does not preclude SARS-Cov-2 infection and should not be used as the sole basis for treatment or other patient management decisions. A negative result may occur with  improper specimen collection/handling, submission of specimen other than nasopharyngeal swab, presence of viral mutation(s) within the areas targeted by this assay, and inadequate number of viral copies(<138 copies/mL). A negative result must be  combined with clinical observations, patient history, and epidemiological information. The expected result is Negative.  Fact Sheet for Patients:  BloggerCourse.com  Fact Sheet for Healthcare Providers:  SeriousBroker.it  This test is no t yet approved or cleared by the [place] FDA and  has been authorized for detection and/or diagnosis of SARS-CoV-2 by FDA under an Emergency Use Authorization (EUA). This EUA will remain  in effect (meaning this test can be used) for the duration of the COVID-19 declaration under Section 564(b)(1) of the Act, 21 U.S.C.section 360bbb-3(b)(1), unless the authorization is terminated  or revoked sooner.       Influenza A by PCR NEGATIVE NEGATIVE Final   Influenza B by PCR NEGATIVE NEGATIVE Final    Comment: (NOTE) The Xpert Xpress SARS-CoV-2/FLU/RSV plus assay is intended as an aid in the diagnosis of influenza from Nasopharyngeal swab specimens and should not be used as a sole basis for treatment. Nasal washings and aspirates are unacceptable for Xpert Xpress SARS-CoV-2/FLU/RSV testing.  Fact Sheet for Patients: BloggerCourse.com  Fact Sheet for Healthcare Providers: SeriousBroker.it  This test is not yet approved or cleared by the [place] FDA and has been authorized for detection and/or diagnosis of SARS-CoV-2 by FDA under an Emergency Use Authorization (EUA). This EUA will remain in effect (meaning this test can be used) for the duration of the COVID-19 declaration under Section 564(b)(1) of the Act, 21 U.S.C. section 360bbb-3(b)(1), unless the authorization is terminated or revoked.  Performed at [facility], [address] [place], [place] [address]   Blood Culture (routine x 2)     Status: None (Preliminary result)   Collection Time: 04/28/21  7:05 PM   Specimen: BLOOD  Result Value Ref Range Status   Specimen  Description BLOOD  RT FOOT  Final   Special Requests BOTTLES DRAWN AEROBIC ONLY  Final   Culture   Final    NO GROWTH 3 DAYS Performed at [facility], [address], [place], [place] [address]    Report Status PENDING  Incomplete  Blood Culture (routine x 2)     Status: None (Preliminary result)   Collection Time: 04/28/21  7:05 PM   Specimen: BLOOD  Result Value Ref Range Status   Specimen Description BLOOD  LFOA  Final   Special Requests   Final    BOTTLES DRAWN AEROBIC AND ANAEROBIC Blood Culture adequate volume   Culture   Final    NO GROWTH 3 DAYS Performed at [facility], [address]  [place], [place] [address]    Report Status PENDING  Incomplete  Urine Culture     Status: None   Collection Time: 04/28/21 10:17 PM   Specimen: Urine, Random  Result Value Ref Range Status   Specimen Description   Final    URINE, RANDOM Performed at [facility], [address] [place], [place] [address]    Special Requests   Final    NONE Performed at [facility], [address] [place], [place] [address]    Culture   Final    NO GROWTH Performed at [facility], [address], [place], [place] [address]    Report Status 04/29/2021 FINAL  Final  MRSA Next Gen by PCR, Nasal     Status: None   Collection Time: 04/29/21 12:55 AM   Specimen: Nasal Mucosa; Nasal Swab  Result Value Ref Range Status   MRSA by PCR Next Gen NOT DETECTED NOT DETECTED Final    Comment: (NOTE) The GeneXpert MRSA Assay (FDA approved for NASAL specimens only), is one component of a comprehensive MRSA colonization surveillance program. It is not intended to diagnose MRSA infection nor to guide or monitor treatment for MRSA infections. Test performance is not FDA approved in patients less than 23 years old. Performed at [facility], [address] [place], [place] [address]     Coagulation Studies: Recent Labs    04/28/21 1905  LABPROT  12.0  INR 0.9     Urinalysis: Recent Labs    04/28/21 2217  COLORURINE YELLOW  LABSPEC 1.010  PHURINE 7.0  GLUCOSEU NEGATIVE  HGBUR NEGATIVE  BILIRUBINUR NEGATIVE  KETONESUR NEGATIVE  PROTEINUR NEGATIVE  NITRITE NEGATIVE  LEUKOCYTESUR NEGATIVE       Imaging: DG Abd 1 View  Result Date: 04/30/2021 CLINICAL DATA:  Check gastric catheter placement EXAM: ABDOMEN - 1 VIEW COMPARISON:  04/28/2021 FINDINGS: Gastric catheter is noted deep in the stomach. Scattered large and small bowel gas is noted. IMPRESSION: Gastric catheter within the stomach. Electronically Signed   By: Alcide Clever M.D.   On: 04/30/2021 20:19   DG Chest Port 1 View  Result Date: 04/30/2021 CLINICAL DATA:  ET tube. EXAM: PORTABLE CHEST 1 VIEW COMPARISON:  Same day chest radiograph. FINDINGS: Leftward patient rotation. Endotracheal tube tip is approximally 3 cm above the carina. Gastric tube courses below the diaphragm with the tip outside the field of view. Similar left basilar opacities. No visible pneumothorax. Cardiomediastinal silhouette is similar when accounting for patient rotation. IMPRESSION: 1. Endotracheal tube tip is approximally 3 cm above the carina. 2. Similar left basilar opacities. Electronically Signed   By: Feliberto Harts M.D.   On: 04/30/2021 18:16   DG Chest Port 1 View  Result Date: 04/30/2021 CLINICAL DATA:  Unresponsive. EXAM: PORTABLE CHEST 1 VIEW COMPARISON:  April 29, 2021. FINDINGS: Stable cardiomediastinal silhouette. Stable right infrahilar atelectasis or infiltrate is noted. Increased left basilar atelectasis or infiltrate is noted with associated pleural effusion. Bony thorax is unremarkable. IMPRESSION: Increased left basilar opacity is noted concerning for atelectasis or infiltrate with associated pleural effusion. Stable right infrahilar atelectasis or infiltrate is noted. Electronically Signed   By: Lupita Raider M.D.   On: 04/30/2021 14:54   DG Chest Port 1 View  Result  Date: 04/29/2021 CLINICAL DATA:  Shortness of breath EXAM: PORTABLE CHEST 1 VIEW COMPARISON:  04/28/2021 FINDINGS: Endotracheal tube and gastric catheter have been removed in the interval. Cardiac shadow is stable. Aortic calcifications are  seen. Mild atelectatic changes are noted in the right base. Left lung is clear. No bony abnormality is noted. IMPRESSION: Mild right basilar atelectasis. Electronically Signed   By: Alcide Clever M.D.   On: 04/29/2021 20:19     Medications:      acetylcysteine  4 mL Nebulization BID   arformoterol  15 mcg Nebulization BID   atorvastatin  20 mg Per Tube QHS   buPROPion ER  100 mg Oral Daily   Chlorhexidine Gluconate Cloth  6 each Topical Daily   cholecalciferol  1,000 Units Per Tube Daily   docusate  100 mg Per Tube BID   dorzolamide-timolol  1 drop Both Eyes BID   enoxaparin (LOVENOX) injection  40 mg Subcutaneous Q24H   ezetimibe  10 mg Per Tube QHS   latanoprost  1 drop Both Eyes QHS   methylPREDNISolone (SOLU-MEDROL) injection  40 mg Intravenous Daily   pantoprazole sodium  40 mg Per Tube BID   revefenacin  175 mcg Nebulization Daily   tamsulosin  0.4 mg Oral Daily   albuterol, docusate sodium, hydrALAZINE, hydrOXYzine, nitroGLYCERIN, polyethylene glycol, senna  Assessment/ Plan:  65 y.o. male with   COPD, CAD, hypertension, hyperlipidemia, schizoaffective disorder bipolar 1, seizure, alcohol abuse, MI 2005, history of suicidal attempt in the past admitted on 04/28/2021 for Acute respiratory failure (HCC) [J96.00] Hyponatremia [E87.1] Acute respiratory failure with hypoxia (HCC) [J96.01] Unresponsiveness [R41.89] Acute metabolic encephalopathy [G93.41]   #Hyponatremia Given improvement with IV saline, and that patient was on furosemide 10 mg daily as outpatient, raises concern about hyponatremia being caused by volume depletion We recommend to discontinue furosemide and not restart as an outpatient Sodium level has now stabilized  Will sign  off Please re-consult as necessary    LOS: 3 Asa Baudoin 9/21/202210:26 AM  [facility] [place], [place] [phone]  Note: This note was prepared with Dragon dictation. Any transcription errors are unintentional

## 2021-05-01 NOTE — Progress Notes (Signed)
Chaplain Maggie assisted pt with completing Advance Directive by gathering notary and witnesses.

## 2021-05-01 NOTE — Consult Note (Signed)
[facility] Face-to-Face Psychiatry Consult   Reason for Consult: Follow-up for 65 year old man with a history of schizoaffective disorder.  Patient seen and chart reviewed.  This morning the patient was awake no longer on BiPAP.  He was interactive.  Could speak a little bit but was also using a pen and paper to interact.  Reports that he is feeling better.  Denies hallucinations.  Denies suicidal thought.  On nasal cannula oxygen radiating 100% when I came to see him. Referring Physician: Mannam Patient Identification: Brandon [name] MRN:  [id number] Principal Diagnosis: Schizoaffective disorder, bipolar type (HCC) Diagnosis:  Principal Problem:   Schizoaffective disorder, bipolar type (HCC) Active Problems:   Coronary artery disease   Essential hypertension   COPD (chronic obstructive pulmonary disease) (HCC)   Acute hyponatremia   Schizophrenia (HCC)   BPH (benign prostatic hyperplasia)   Acute respiratory failure with hypoxia and hypercapnia (HCC)   Acute metabolic encephalopathy   Endotracheally intubated   On mechanically assisted ventilation (HCC)   Total Time spent with patient: 30 minutes  Subjective:   Brandon [name] is a 65 y.o. male patient admitted with "I am better".  HPI: See note above.  Patient yesterday was over sedated required BiPAP treatment desaturating.  Unclear etiology but had just started back on clozapine the night before.  Clozapine held last night.  Today whether they are related or not he is more awake.  I advised the patient that we should probably hold on the Clozapine for now but he feels uncomfortable with that and would like to have at least a small dose  Past Psychiatric History: History of schizoaffective disorder chronic dose of Clozapine 150 mg  Risk to Self:   Risk to Others:   Prior Inpatient Therapy:   Prior Outpatient Therapy:    Past Medical History:  Past Medical History:  Diagnosis Date   Anemia    IDA   Colon polyps    COPD (chronic  obstructive pulmonary disease) (HCC)    Coronary artery disease    Heart attack (HCC) 2005   Hiatal hernia    History of ETOH abuse    Hyperlipidemia    Hypertension    Pleurisy    Schizophrenia (HCC)    Schizophrenia (HCC)    Seizures (HCC)    grand mal in 2000 or 2001 recovery from alcoholism   Suicide attempt [facility])     Past Surgical History:  Procedure Laterality Date   ABDOMINAL SURGERY     internal bleeding   CHOLECYSTECTOMY N/A 05/04/2018   Procedure: LAPAROSCOPIC CHOLECYSTECTOMY, converted to open;  Surgeon: Sung Amabile, DO;  Location: [facility] ORS;  Service: General;  Laterality: N/A;   COLONOSCOPY     COLONOSCOPY WITH PROPOFOL N/A 05/09/2016   Procedure: COLONOSCOPY WITH PROPOFOL;  Surgeon: Scot Jun, MD;  Location: [facility] ENDOSCOPY;  Service: Endoscopy;  Laterality: N/A;   CORONARY ANGIOPLASTY WITH STENT PLACEMENT     1 vessel   ESOPHAGOGASTRODUODENOSCOPY (EGD) WITH PROPOFOL N/A 05/09/2016   Procedure: ESOPHAGOGASTRODUODENOSCOPY (EGD) WITH PROPOFOL;  Surgeon: Scot Jun, MD;  Location: [facility] ENDOSCOPY;  Service: Endoscopy;  Laterality: N/A;   EYE SURGERY Right    GLAUCOMA SURGERY     LAPAROSCOPIC APPENDECTOMY N/A 07/17/2018   Procedure: APPENDECTOMY LAPAROSCOPIC;  Surgeon: Sung Amabile, DO;  Location: [facility] ORS;  Service: General;  Laterality: N/A;   LEFT HEART CATH Right 10/19/2017   Procedure: Left Heart Cath and Coronary Angiography;  Surgeon: Laurier Nancy, MD;  Location: [facility] INVASIVE  CV LAB;  Service: Cardiovascular;  Laterality: Right;   NOSE SURGERY     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   VASECTOMY     Family History:  Family History  Problem Relation Age of Onset   Lung cancer Mother    Hypertension Father    Heart attack Father    CAD Father    Prostate cancer Neg Hx    Bladder Cancer Neg Hx    Kidney cancer Neg Hx    Family Psychiatric  History: See previous Social History:  Social History   Substance and Sexual Activity  Alcohol Use No    Comment: no alcohol since 2010     Social History   Substance and Sexual Activity  Drug Use No    Social History   Socioeconomic History   Marital status: Married    Spouse name: Not on file   Number of children: Not on file   Years of education: Not on file   Highest education level: Not on file  Occupational History   Not on file  Tobacco Use   Smoking status: Former    Packs/day: 1.00    Types: Cigarettes    Quit date: 2012    Years since quitting: 10.7   Smokeless tobacco: Former  Building services engineer Use: Never used  Substance and Sexual Activity   Alcohol use: No    Comment: no alcohol since 2010   Drug use: No   Sexual activity: Not on file  Other Topics Concern   Not on file  Social History Narrative   ** Merged History Encounter **       Social Determinants of Health   Financial Resource Strain: Not on file  Food Insecurity: Not on file  Transportation Needs: Not on file  Physical Activity: Not on file  Stress: Not on file  Social Connections: Not on file   Additional Social History:    Allergies:   Allergies  Allergen Reactions   Compazine [Prochlorperazine] Other (See Comments)    Dystonic rxn - convulsions. Near fatal reaction.    Ivp Dye [Iodinated Diagnostic Agents] Shortness Of Breath    Pt states SOB after last IV contrast injection   Alcohol-Sulfur [Elemental Sulfur] Other (See Comments)    History of alcoholism   Benadryl [Diphenhydramine] Other (See Comments)    "stuffy", nasal congestion   Depakote [Divalproex Sodium] Other (See Comments)    Cause elevated ammonia   Dramamine [Dimenhydrinate] Swelling   Plavix [Clopidogrel] Other (See Comments)    Rectal bleeding. "Perforated my intestines."   Rosuvastatin     Other reaction(s): Other (See Comments)    Labs:  Results for orders placed or performed during the hospital encounter of 04/28/21 (from the past 48 hour(s))  Glucose, capillary     Status: Abnormal   Collection Time:  04/29/21 11:46 AM  Result Value Ref Range   Glucose-Capillary 199 (H) 70 - 99 mg/dL    Comment: Glucose reference range applies only to samples taken after fasting for at least 8 hours.  Glucose, capillary     Status: Abnormal   Collection Time: 04/29/21  3:24 PM  Result Value Ref Range   Glucose-Capillary 100 (H) 70 - 99 mg/dL    Comment: Glucose reference range applies only to samples taken after fasting for at least 8 hours.  Glucose, capillary     Status: Abnormal   Collection Time: 04/29/21  8:45 PM  Result Value Ref Range  Glucose-Capillary 107 (H) 70 - 99 mg/dL    Comment: Glucose reference range applies only to samples taken after fasting for at least 8 hours.  Sodium     Status: Abnormal   Collection Time: 04/29/21  9:27 PM  Result Value Ref Range   Sodium 129 (L) 135 - 145 mmol/L    Comment: Performed at [facility], [address] [place], [place] [address]  Glucose, capillary     Status: None   Collection Time: 04/30/21 12:50 AM  Result Value Ref Range   Glucose-Capillary 92 70 - 99 mg/dL    Comment: Glucose reference range applies only to samples taken after fasting for at least 8 hours.  Procalcitonin     Status: None   Collection Time: 04/30/21  1:48 AM  Result Value Ref Range   Procalcitonin <0.10 ng/mL    Comment:        Interpretation: PCT (Procalcitonin) <= 0.5 ng/mL: Systemic infection (sepsis) is not likely. Local bacterial infection is possible. (NOTE)       Sepsis PCT Algorithm           Lower Respiratory Tract                                      Infection PCT Algorithm    ----------------------------     ----------------------------         PCT < 0.25 ng/mL                PCT < 0.10 ng/mL          Strongly encourage             Strongly discourage   discontinuation of antibiotics    initiation of antibiotics    ----------------------------     -----------------------------       PCT 0.25 - 0.50 ng/mL            PCT 0.10 - 0.25 ng/mL                OR       >80% decrease in PCT            Discourage initiation of                                            antibiotics      Encourage discontinuation           of antibiotics    ----------------------------     -----------------------------         PCT >= 0.50 ng/mL              PCT 0.26 - 0.50 ng/mL               AND        <80% decrease in PCT             Encourage initiation of                                             antibiotics       Encourage continuation           of antibiotics    ----------------------------     -----------------------------  PCT >= 0.50 ng/mL                  PCT > 0.50 ng/mL               AND         increase in PCT                  Strongly encourage                                      initiation of antibiotics    Strongly encourage escalation           of antibiotics                                     -----------------------------                                           PCT <= 0.25 ng/mL                                                 OR                                        > 80% decrease in PCT                                      Discontinue / Do not initiate                                             antibiotics  Performed at [facility], [address], [place], [place] [address]   Basic metabolic panel     Status: Abnormal   Collection Time: 04/30/21  1:48 AM  Result Value Ref Range   Sodium 130 (L) 135 - 145 mmol/L   Potassium 3.7 3.5 - 5.1 mmol/L   Chloride 97 (L) 98 - 111 mmol/L   CO2 28 22 - 32 mmol/L   Glucose, Bld 95 70 - 99 mg/dL    Comment: Glucose reference range applies only to samples taken after fasting for at least 8 hours.   BUN 7 (L) 8 - 23 mg/dL   Creatinine, Ser 6.04 0.61 - 1.24 mg/dL   Calcium 8.5 (L) 8.9 - 10.3 mg/dL   GFR, Estimated >54 >09 mL/min    Comment: (NOTE) Calculated using the CKD-EPI Creatinine Equation (2021)    Anion gap 5 5 - 15    Comment: Performed at [facility], [address] [place], [place] [address]  CBC with Differential/Platelet     Status: Abnormal   Collection Time: 04/30/21  1:48 AM  Result Value Ref Range   WBC 8.2 4.0 - 10.5 K/uL   RBC 4.01 (L) 4.22 -  5.81 MIL/uL   Hemoglobin 11.7 (L) 13.0 - 17.0 g/dL   HCT 67.6 (L) 19.5 - 09.3 %   MCV 87.5 80.0 - 100.0 fL   MCH 29.2 26.0 - 34.0 pg   MCHC 33.3 30.0 - 36.0 g/dL   RDW 26.7 12.4 - 58.0 %   Platelets 302 150 - 400 K/uL   nRBC 0.0 0.0 - 0.2 %   Neutrophils Relative % 74 %   Neutro Abs 6.1 1.7 - 7.7 K/uL   Lymphocytes Relative 16 %   Lymphs Abs 1.3 0.7 - 4.0 K/uL   Monocytes Relative 9 %   Monocytes Absolute 0.8 0.1 - 1.0 K/uL   Eosinophils Relative 0 %   Eosinophils Absolute 0.0 0.0 - 0.5 K/uL   Basophils Relative 0 %   Basophils Absolute 0.0 0.0 - 0.1 K/uL   Immature Granulocytes 1 %   Abs Immature Granulocytes 0.05 0.00 - 0.07 K/uL    Comment: Performed at [facility], [address], [place], [place] [address]  Magnesium     Status: None   Collection Time: 04/30/21  1:48 AM  Result Value Ref Range   Magnesium 1.7 1.7 - 2.4 mg/dL    Comment: Performed at [facility], [address] [place], [place] [address]  Phosphorus     Status: None   Collection Time: 04/30/21  1:48 AM  Result Value Ref Range   Phosphorus 3.1 2.5 - 4.6 mg/dL    Comment: Performed at [facility] [address] [place], [place] [address]  Glucose, capillary     Status: None   Collection Time: 04/30/21  4:28 AM  Result Value Ref Range   Glucose-Capillary 93 70 - 99 mg/dL    Comment: Glucose reference range applies only to samples taken after fasting for at least 8 hours.  Sodium     Status: Abnormal   Collection Time: 04/30/21  5:41 AM  Result Value Ref Range   Sodium 130 (L) 135 - 145 mmol/L    Comment: Performed at [facility], [address] [place], [place] [address]  Glucose, capillary     Status: None   Collection Time: 04/30/21   8:17 AM  Result Value Ref Range   Glucose-Capillary 99 70 - 99 mg/dL    Comment: Glucose reference range applies only to samples taken after fasting for at least 8 hours.  Glucose, capillary     Status: Abnormal   Collection Time: 04/30/21 11:17 AM  Result Value Ref Range   Glucose-Capillary 106 (H) 70 - 99 mg/dL    Comment: Glucose reference range applies only to samples taken after fasting for at least 8 hours.  Blood gas, arterial     Status: Abnormal   Collection Time: 04/30/21  1:35 PM  Result Value Ref Range   FIO2 0.30    Delivery systems NASAL CANNULA    pH, Arterial 7.35 7.350 - 7.450   pCO2 arterial 56 (H) 32.0 - 48.0 mmHg   pO2, Arterial 78 (L) 83.0 - 108.0 mmHg   Bicarbonate 30.9 (H) 20.0 - 28.0 mmol/L   Acid-Base Excess 4.0 (H) 0.0 - 2.0 mmol/L   O2 Saturation 94.7 %   Patient temperature 37.0    Collection site RIGHT RADIAL    Sample type ARTERIAL DRAW    Allens test (pass/fail) POSITIVE (A) PASS    Comment: Performed at [facility], [address] [place], [place] [address]  Glucose, capillary     Status:  Abnormal   Collection Time: 04/30/21  3:52 PM  Result Value Ref Range   Glucose-Capillary 113 (H) 70 - 99 mg/dL    Comment: Glucose reference range applies only to samples taken after fasting for at least 8 hours.  Blood gas, arterial     Status: Abnormal   Collection Time: 04/30/21  7:21 PM  Result Value Ref Range   FIO2 30.00    Delivery systems VENTILATOR    Mode PRESSURE REGULATED VOLUME CONTROL    VT 500 mL   LHR 20 resp/min   pH, Arterial 7.50 (H) 7.350 - 7.450   pCO2 arterial 38 32.0 - 48.0 mmHg   pO2, Arterial 65 (L) 83.0 - 108.0 mmHg   Bicarbonate 29.6 (H) 20.0 - 28.0 mmol/L   Acid-Base Excess 6.1 (H) 0.0 - 2.0 mmol/L   O2 Saturation 94.2 %   Patient temperature 37.0    Collection site RIGHT RADIAL    Sample type ARTERIAL DRAW    Allens test (pass/fail) PASS PASS    Comment: Performed at [facility], [address]  [name], [place] [address]  Glucose, capillary     Status: Abnormal   Collection Time: 04/30/21  7:31 PM  Result Value Ref Range   Glucose-Capillary 117 (H) 70 - 99 mg/dL    Comment: Glucose reference range applies only to samples taken after fasting for at least 8 hours.   Comment 1 Notify RN    Comment 2 Document in Chart   Glucose, capillary     Status: Abnormal   Collection Time: 04/30/21 11:14 PM  Result Value Ref Range   Glucose-Capillary 109 (H) 70 - 99 mg/dL    Comment: Glucose reference range applies only to samples taken after fasting for at least 8 hours.   Comment 1 Notify RN    Comment 2 Document in Chart   Glucose, capillary     Status: Abnormal   Collection Time: 05/01/21  3:29 AM  Result Value Ref Range   Glucose-Capillary 110 (H) 70 - 99 mg/dL    Comment: Glucose reference range applies only to samples taken after fasting for at least 8 hours.   Comment 1 Notify RN    Comment 2 Document in Chart   Comprehensive metabolic panel     Status: Abnormal   Collection Time: 05/01/21  5:04 AM  Result Value Ref Range   Sodium 130 (L) 135 - 145 mmol/L   Potassium 4.0 3.5 - 5.1 mmol/L   Chloride 94 (L) 98 - 111 mmol/L   CO2 29 22 - 32 mmol/L   Glucose, Bld 110 (H) 70 - 99 mg/dL    Comment: Glucose reference range applies only to samples taken after fasting for at least 8 hours.   BUN 11 8 - 23 mg/dL   Creatinine, Ser 6.04 (L) 0.61 - 1.24 mg/dL   Calcium 8.8 (L) 8.9 - 10.3 mg/dL   Total Protein 5.2 (L) 6.5 - 8.1 g/dL   Albumin 2.7 (L) 3.5 - 5.0 g/dL   AST 19 15 - 41 U/L   ALT 16 0 - 44 U/L   Alkaline Phosphatase 81 38 - 126 U/L   Total Bilirubin 0.8 0.3 - 1.2 mg/dL   GFR, Estimated >54 >09 mL/min    Comment: (NOTE) Calculated using the CKD-EPI Creatinine Equation (2021)    Anion gap 7 5 - 15    Comment: Performed at [facility], [address] [place], [place] [address]  Magnesium  Status: None   Collection Time: 05/01/21  5:04 AM  Result Value Ref Range    Magnesium 1.8 1.7 - 2.4 mg/dL    Comment: Performed at [facility], [address] [place], [place] [address]  Glucose, capillary     Status: Abnormal   Collection Time: 05/01/21  7:45 AM  Result Value Ref Range   Glucose-Capillary 125 (H) 70 - 99 mg/dL    Comment: Glucose reference range applies only to samples taken after fasting for at least 8 hours.    Current Facility-Administered Medications  Medication Dose Route Frequency Provider Last Rate Last Admin   acetylcysteine (MUCOMYST) 20 % nebulizer / oral solution 4 mL  4 mL Nebulization BID Pokhrel, Laxman, MD   4 mL at 05/01/21 0712   albuterol (PROVENTIL) (2.5 MG/3ML) 0.083% nebulizer solution 3 mL  3 mL Inhalation Q6H PRN Lianne Cure, NP       arformoterol (BROVANA) nebulizer solution 15 mcg  15 mcg Nebulization BID Martina Sinner, MD   15 mcg at 05/01/21 3354   atorvastatin (LIPITOR) tablet 20 mg  20 mg Per Tube QHS Martina Sinner, MD       buPROPion ER [facility] SR) 12 hr tablet 100 mg  100 mg Oral Daily Lianne Cure, NP   100 mg at 05/01/21 5625   Chlorhexidine Gluconate Cloth 2 % PADS 6 each  6 each Topical Daily Martina Sinner, MD   6 each at 05/01/21 6389   cholecalciferol (VITAMIN D3) tablet 1,000 Units  1,000 Units Per Tube Daily Martina Sinner, MD   1,000 Units at 05/01/21 0907   docusate (COLACE) 50 MG/5ML liquid 100 mg  100 mg Per Tube BID Melody Comas B, MD   100 mg at 05/01/21 3734   docusate sodium (COLACE) capsule 100 mg  100 mg Oral BID PRN Rust-Chester, Cecelia Byars, NP       dorzolamide-timolol (COSOPT) 22.3-6.8 MG/ML ophthalmic solution 1 drop  1 drop Both Eyes BID Rust-Chester, Britton L, NP   1 drop at 05/01/21 0915   enoxaparin (LOVENOX) injection 40 mg  40 mg Subcutaneous Q24H Rust-Chester, Britton L, NP   40 mg at 04/30/21 2107   ezetimibe (ZETIA) tablet 10 mg  10 mg Per Tube QHS Martina Sinner, MD   10 mg at 04/30/21 2140   hydrALAZINE (APRESOLINE) injection 10 mg  10  mg Intravenous Q6H PRN Martina Sinner, MD   10 mg at 04/30/21 1942   hydrOXYzine (ATARAX/VISTARIL) tablet 25 mg  25 mg Oral TID PRN Lianne Cure, NP       latanoprost (XALATAN) 0.005 % ophthalmic solution 1 drop  1 drop Both Eyes QHS Graves, Dana E, NP   1 drop at 04/30/21 2140   methylPREDNISolone sodium succinate (SOLU-MEDROL) 40 mg/mL injection 40 mg  40 mg Intravenous Daily Melody Comas B, MD   40 mg at 05/01/21 0907   nitroGLYCERIN (NITROSTAT) SL tablet 0.4 mg  0.4 mg Sublingual Q5 min PRN Lianne Cure, NP       pantoprazole sodium (PROTONIX) 40 mg/20 mL oral suspension 40 mg  40 mg Per Tube BID Melody Comas B, MD   40 mg at 05/01/21 2876   polyethylene glycol (MIRALAX / GLYCOLAX) packet 17 g  17 g Oral Daily PRN Rust-Chester, Cecelia Byars, NP       revefenacin (YUPELRI) nebulizer solution 175 mcg  175 mcg Nebulization Daily Martina Sinner, MD   175 mcg  at 05/01/21 4098   senna (SENOKOT) tablet 8.6-17.2 mg  1-2 tablet Oral Daily PRN Lianne Cure, NP       tamsulosin (FLOMAX) capsule 0.4 mg  0.4 mg Oral Daily Lianne Cure, NP   0.4 mg at 05/01/21 0907    Musculoskeletal: Strength & Muscle Tone: within normal limits Gait & Station: unable to stand Patient leans: N/A            Psychiatric Specialty Exam:  Presentation  General Appearance:  No data recorded Eye Contact: No data recorded Speech: No data recorded Speech Volume: No data recorded Handedness: No data recorded  Mood and Affect  Mood: No data recorded Affect: No data recorded  Thought Process  Thought Processes: No data recorded Descriptions of Associations:No data recorded Orientation:No data recorded Thought Content:No data recorded History of Schizophrenia/Schizoaffective disorder:No data recorded Duration of Psychotic Symptoms:No data recorded Hallucinations:No data recorded Ideas of Reference:No data recorded Suicidal Thoughts:No data recorded Homicidal Thoughts:No data  recorded  Sensorium  Memory: No data recorded Judgment: No data recorded Insight: No data recorded  Executive Functions  Concentration: No data recorded Attention Span: No data recorded Recall: No data recorded Fund of Knowledge: No data recorded Language: No data recorded  Psychomotor Activity  Psychomotor Activity: No data recorded  Assets  Assets: No data recorded  Sleep  Sleep: No data recorded  Physical Exam: Physical Exam Vitals and nursing note reviewed.  Constitutional:      Appearance: Normal appearance.  HENT:     Head: Normocephalic and atraumatic.     Mouth/Throat:     Pharynx: Oropharynx is clear.  Eyes:     Pupils: Pupils are equal, round, and reactive to light.  Cardiovascular:     Rate and Rhythm: Normal rate and regular rhythm.  Pulmonary:     Effort: Pulmonary effort is normal.     Breath sounds: Normal breath sounds.  Abdominal:     General: Abdomen is flat.     Palpations: Abdomen is soft.  Musculoskeletal:        General: Normal range of motion.  Skin:    General: Skin is warm and dry.  Neurological:     General: No focal deficit present.     Mental Status: He is alert. Mental status is at baseline.  Psychiatric:        Mood and Affect: Mood normal.        Thought Content: Thought content normal.   Review of Systems  Constitutional: Negative.   HENT: Negative.    Eyes: Negative.   Respiratory: Negative.    Cardiovascular: Negative.   Gastrointestinal: Negative.   Musculoskeletal: Negative.   Skin: Negative.   Neurological: Negative.   Psychiatric/Behavioral: Negative.    Blood pressure (!) 159/95, pulse 64, temperature 97.9 F (36.6 C), temperature source Oral, resp. rate 20, weight 86.4 kg, SpO2 100 %. Body mass index is 25.83 kg/m.  Treatment Plan Summary: Plan continue Wellbutrin in the morning and continue Clozapine but only at 25 mg at night.  We will continue to follow up.  Disposition: Patient does not meet  criteria for psychiatric inpatient admission.  Mordecai Rasmussen, MD 05/01/2021 11:43 AM

## 2021-05-01 NOTE — Progress Notes (Signed)
SUBJECTIVE: 65 year old male presented from [facility] via EMS for unresponsiveness, hypotensive in the 50s. Blood pressure improved with IV fluids.  Patient re-intubated 04/30/21 evening.   Patient alert this morning. Writing on a clipboard.   Vitals:   05/01/21 0632 05/01/21 0700 05/01/21 0715 05/01/21 0745  BP: (!) 153/94 (!) 159/95    Pulse: 66 (!) 59 64   Resp: 20 20 20    Temp:    97.9 F (36.6 C)  TempSrc:    Oral  SpO2: 100% 100% 100%   Weight:        Intake/Output Summary (Last 24 hours) at 05/01/2021 0929 Last data filed at 05/01/2021 0757 Gross per 24 hour  Intake 333.45 ml  Output 1550 ml  Net -1216.55 ml    LABS: Basic Metabolic Panel: Recent Labs    04/29/21 0536 04/29/21 2127 04/30/21 0148 04/30/21 0541 05/01/21 0504  NA 127*   < > 130* 130* 130*  K 3.9  --  3.7  --  4.0  CL 92*  --  97*  --  94*  CO2 28  --  28  --  29  GLUCOSE 115*  --  95  --  110*  BUN 7*  --  7*  --  11  CREATININE 0.67  --  0.63  --  0.53*  CALCIUM 8.9  --  8.5*  --  8.8*  MG 1.9  --  1.7  --  1.8  PHOS 3.4  --  3.1  --   --    < > = values in this interval not displayed.   Liver Function Tests: Recent Labs    04/28/21 1905 05/01/21 0504  AST 16 19  ALT 13 16  ALKPHOS 78 81  BILITOT 0.7 0.8  PROT 5.3* 5.2*  ALBUMIN 2.9* 2.7*   No results for input(s): LIPASE, AMYLASE in the last 72 hours. CBC: Recent Labs    04/28/21 1905 04/29/21 0536 04/30/21 0148  WBC 9.6 8.3 8.2  NEUTROABS 7.6  --  6.1  HGB 10.9* 13.2 11.7*  HCT 32.5* 40.6 35.1*  MCV 87.8 86.8 87.5  PLT 266 303 302   Cardiac Enzymes: No results for input(s): CKTOTAL, CKMB, CKMBINDEX, TROPONINI in the last 72 hours. BNP: Invalid input(s): POCBNP D-Dimer: No results for input(s): DDIMER in the last 72 hours. Hemoglobin A1C: No results for input(s): HGBA1C in the last 72 hours. Fasting Lipid Panel: No results for input(s): CHOL, HDL, LDLCALC, TRIG, CHOLHDL, LDLDIRECT in the last 72  hours. Thyroid Function Tests: Recent Labs    04/29/21 0217 04/29/21 0536  TSH 1.200 0.975  T4TOTAL 7.9  --    Anemia Panel: No results for input(s): VITAMINB12, FOLATE, FERRITIN, TIBC, IRON, RETICCTPCT in the last 72 hours.   PHYSICAL EXAM General: Well developed, well nourished, in no acute distress HEENT:  Normocephalic and atramatic Neck:  No JVD.  Lungs: Clear bilaterally to auscultation and percussion. Heart: HRRR . Normal S1 and S2 without gallops or murmurs.  Abdomen: Bowel sounds are positive, abdomen soft and non-tender  Msk:  Back normal, normal gait. Normal strength and tone for age. Extremities: No clubbing, cyanosis or edema.   Neuro: Alert and oriented X 3. Psych:  Good affect, responds appropriately  TELEMETRY: NSR, HR 62  ASSESSMENT AND PLAN: Blood pressure improved on hydralazine. Continue to observe.  Principal Problem:   Schizoaffective disorder, bipolar type [facility]) Active Problems:   Coronary artery disease   Essential hypertension   COPD (  chronic obstructive pulmonary disease) (HCC)   Acute hyponatremia   Schizophrenia (HCC)   BPH (benign prostatic hyperplasia)   Acute respiratory failure with hypoxia and hypercapnia (HCC)   Acute metabolic encephalopathy   Endotracheally intubated   On mechanically assisted ventilation (HCC)    Daelyn Mozer, FNP-C 05/01/2021 9:29 AM

## 2021-05-01 NOTE — Progress Notes (Signed)
OT Cancellation Note  Patient Details Name: Brandon [name] MRN: [id number] DOB: 1956/06/10   Cancelled Treatment:    Reason Eval/Treat Not Completed: Patient not medically ready. Patient noted with decline in respiratory status and reintubation on 04/30/21. Will complete current OT order; please re-consult as medically appropriate  Kathie Dike, M.S. OTR/L  05/01/21, 8:19 AM  ascom [phone]

## 2021-05-01 NOTE — Evaluation (Signed)
Clinical/Bedside Swallow Evaluation Patient Details  Name: Brandon [name] MRN: [id number] Date of Birth: 10/24/1955  Today's Date: 05/01/2021 Time: SLP Start Time (ACUTE ONLY): 1028 SLP Stop Time (ACUTE ONLY): 1115 SLP Time Calculation (min) (ACUTE ONLY): 47 min  Past Medical History:  Past Medical History:  Diagnosis Date   Anemia    IDA   Colon polyps    COPD (chronic obstructive pulmonary disease) (HCC)    Coronary artery disease    Heart attack (HCC) 2005   Hiatal hernia    History of ETOH abuse    Hyperlipidemia    Hypertension    Pleurisy    Schizophrenia (HCC)    Schizophrenia (HCC)    Seizures (HCC)    grand mal in 2000 or 2001 recovery from alcoholism   Suicide attempt [facility])    Past Surgical History:  Past Surgical History:  Procedure Laterality Date   ABDOMINAL SURGERY     internal bleeding   CHOLECYSTECTOMY N/A 05/04/2018   Procedure: LAPAROSCOPIC CHOLECYSTECTOMY, converted to open;  Surgeon: Sung Amabile, DO;  Location: [facility] ORS;  Service: General;  Laterality: N/A;   COLONOSCOPY     COLONOSCOPY WITH PROPOFOL N/A 05/09/2016   Procedure: COLONOSCOPY WITH PROPOFOL;  Surgeon: Scot Jun, MD;  Location[facility] ENDOSCOPY;  Service: Endoscopy;  Laterality: N/A;   CORONARY ANGIOPLASTY WITH STENT PLACEMENT     1 vessel   ESOPHAGOGASTRODUODENOSCOPY (EGD) WITH PROPOFOL N/A 05/09/2016   Procedure: ESOPHAGOGASTRODUODENOSCOPY (EGD) WITH PROPOFOL;  Surgeon: Scot Jun, MD;  Location: [facility] ENDOSCOPY;  Service: Endoscopy;  Laterality: N/A;   EYE SURGERY Right    GLAUCOMA SURGERY     LAPAROSCOPIC APPENDECTOMY N/A 07/17/2018   Procedure: APPENDECTOMY LAPAROSCOPIC;  Surgeon: Sung Amabile, DO;  Location: [facility] ORS;  Service: General;  Laterality: N/A;   LEFT HEART CATH Right 10/19/2017   Procedure: Left Heart Cath and Coronary Angiography;  Surgeon: Laurier Nancy, MD;  Location: [facility] INVASIVE CV LAB;  Service: Cardiovascular;  Laterality: Right;   NOSE SURGERY     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 male with   COPD, CAD, hypertension, hyperlipidemia, schizophrenia, schizoaffective disorder bipolar 1, generalized weakness, debility, query early Dementia, seizure, Alcohol Abuse, MI 2005, history of suicidal attempt in the past admitted on 04/28/2021 for Acute respiratory failure,  Hyponatremia, Acute respiratory failure with hypoxia, Unresponsiveness, Acute metabolic encephalopathy.  Psychiatry is following. Recent admits to the ED/hospital in recent month.  During this admit so far, pt has had 2 brief oral intubations lasting ~1 day each; w/ BiPAP.  He self-extubated this morning at the time of this eval and is on [place] O2 support of 3L.   Head CT: No acute intracranial abnormalities.  Mild cerebral atrophy.   CXR: left basilar opacities.    Assessment / Plan / Recommendation  Clinical Impression  Pt seen late this morning for BSE. Pt self-extubated early this morning after less than 1 day oral intubation; this is the 2nd event of a 1 day oral intubation. He appears to be tolerating 3L O2 [place] currenlty w/out distress noted. Oral care performed; then BSE.     Pt appears to present w/ adequate oropharyngeal phase swallow w/ No oropharyngeal phase dysphagia noted, No neuromuscular deficits noted. Pt consumed po trials w/ No overt, clinical s/s of aspiration during po trials. Pt appears at reduced risk for aspiration following general aspiration precautions.   During  po trials, pt consumed all consistencies w/ no overt coughing, decline in vocal quality, or change in respiratory presentation during/post trials. O2 sats 98%, HR 70s, RR 21.  Oral phase appeared [facility] w/ timely bolus management, mastication, and control of bolus propulsion for A-P transfer for swallowing. Oral clearing achieved w/ all trial consistencies. OM Exam appeared [facility] w/ no unilateral weakness noted. Speech Clear though Low Volume of speech -- suspect related to recent  intubations/extubations. Pt also has ?? early Dementia per MD note. Unsure if any Cognitive decline impact. Min verbal cues given during this eval -- pt followed basic instructions appropriately; oriented x3. Pt fed self w/ min setup support.   Recommend a Mech Soft consistency diet w/ well-Cut meats, moistened foods; Thin liquids. Recommend general aspiration precautions, tray setup at meals and positioning Upright. Reflux precautions d/t ETOH abuse. Pills WHOLE in Puree for safer, easier swallowing as pt described Larger pills causing difficulty to swallow. Education given on Pills in Puree; food consistencies and easy to eat options; general aspiration precautions. NSG to reconsult if any new needs arise. NSG agreed.  OF NOTE: pt may benefit from f/u at Discharge for a formal Cognitive-linguistic assessment to determine if any new decline or new needs to support ADLs SLP Visit Diagnosis: Dysphagia, unspecified (R13.10)    Aspiration Risk   (reduced following general aspiration precautions)    Diet Recommendation   Mech Soft consistency diet w/ well-Cut meats, moistened foods; Thin liquids. Recommend general aspiration precautions, tray setup at meals and positioning Upright. Reflux precautions  Medication Administration: Whole meds with puree (vs need to crush in puree)    Other  Recommendations Recommended Consults:  (Dietician f/u) Oral Care Recommendations: Oral care BID;Patient independent with oral care (staff support) Other Recommendations:  (n/a)    Recommendations for follow up therapy are one component of a multi-disciplinary discharge planning process, led by the attending physician.  Recommendations may be updated based on patient status, additional functional criteria and insurance authorization.  Follow up Recommendations None (for swallowing)      Frequency and Duration  (n/a)   (n/a)       Prognosis Prognosis for Safe Diet Advancement: Good Barriers to Reach Goals:  Time post onset;Severity of deficits (illness; weakness)      Swallow Study   General Date of Onset: 04/28/21 HPI: Pt is a 65 y.o. male with   COPD, CAD, hypertension, hyperlipidemia, schizophrenia, schizoaffective disorder bipolar 1, generalized weakness, debility, query early Dementia, seizure, Alcohol Abuse, MI 2005, history of suicidal attempt in the past admitted on 04/28/2021 for Acute respiratory failure,  Hyponatremia, Acute respiratory failure with hypoxia, Unresponsiveness, Acute metabolic encephalopathy.  Psychiatry is following. Recent admits to the ED/hospital in recent month.  During this admit so far, pt has had 2 brief oral intubations lasting ~1 day each; w/ BiPAP.  He self-extubated this morning at the time of this eval and is on [place] O2 support of 3L.   Head CT: No acute intracranial abnormalities.  Mild cerebral atrophy.   CXR: left basilar opacities. Type of Study: Bedside Swallow Evaluation Previous Swallow Assessment: none Diet Prior to this Study: NPO Temperature Spikes Noted: No (wbc 8.2) Respiratory Status: Nasal cannula (3L) History of Recent Intubation: Yes Length of Intubations (days): 1 days Date extubated: 05/01/21 Behavior/Cognition: Alert;Cooperative;Pleasant mood;Distractible;Requires cueing (?? early Dementia per MD) Oral Cavity Assessment: Dry;Dried secretions Oral Care Completed by SLP: Yes Oral Cavity - Dentition: Adequate natural dentition;Missing dentition (few) Vision: Functional for self-feeding Self-Feeding Abilities:  Able to feed self;Needs assist;Needs set up Patient Positioning: Upright in bed (needed positioning) Baseline Vocal Quality: Low vocal intensity Volitional Cough:  (Fair+) Volitional Swallow: Able to elicit    Oral/Motor/Sensory Function Overall Oral Motor/Sensory Function: Within functional limits   Ice Chips Ice chips: Within functional limits Presentation: Spoon (fed; 3 trials)   Thin Liquid Thin Liquid: Within functional  limits Presentation: Cup;Self Fed;Straw (~3 ozs total) Other Comments: water, coffee, juice    Nectar Thick Nectar Thick Liquid: Not tested   Honey Thick Honey Thick Liquid: Not tested   Puree Puree: Within functional limits Presentation: Spoon;Self Fed (~3-4 ozs)   Solid     Solid: Within functional limits Presentation: Self Fed;Spoon (8 trials)         Brandon Som, MS, CCC-SLP Speech Language Pathologist Rehab Services [phone] Brandon [name] 05/01/2021,3:38 PM

## 2021-05-01 NOTE — Progress Notes (Signed)
Patient extubated and placed on 2L nasal cannula.  Patient with moderate cough and able to state his name.  Patient complains of pain in throat but states "the pain is ok.  Thank you for helping me".

## 2021-05-01 NOTE — Procedures (Signed)
Intubation Procedure Note  Brandon [name]  [id number]  03/06/1956  Date:05/01/21  Time:8:32 AM   Provider Performing:Brandon [name] B Antwyne Pingree    Procedure: Intubation (31500)  Indication(s) Respiratory Failure  Consent Risks of the procedure as well as the alternatives and risks of each were explained to the patient and/or caregiver.  Consent for the procedure was obtained and is signed in the bedside chart   Anesthesia Etomidate, Versed, Fentanyl, and Rocuronium   Time Out Verified patient identification, verified procedure, site/side was marked, verified correct patient position, special equipment/implants available, medications/allergies/relevant history reviewed, required imaging and test results available.   Sterile Technique Usual hand hygeine, masks, and gloves were used   Procedure Description Patient positioned in bed supine.  Sedation given as noted above.  Patient was intubated with endotracheal tube using Glidescope.  View was Grade 1 full glottis .  Number of attempts was 1.  Colorimetric CO2 detector was consistent with tracheal placement.   Complications/Tolerance None; patient tolerated the procedure well. Chest X-ray is ordered to verify placement.   EBL Minimal   Specimen(s) None

## 2021-05-02 ENCOUNTER — Inpatient Hospital Stay: Payer: Medicare Other

## 2021-05-02 DIAGNOSIS — G9341 Metabolic encephalopathy: Secondary | ICD-10-CM | POA: Diagnosis not present

## 2021-05-02 LAB — CBC
HCT: 36.8 % — ABNORMAL LOW (ref 39.0–52.0)
Hemoglobin: 12.3 g/dL — ABNORMAL LOW (ref 13.0–17.0)
MCH: 29.6 pg (ref 26.0–34.0)
MCHC: 33.4 g/dL (ref 30.0–36.0)
MCV: 88.5 fL (ref 80.0–100.0)
Platelets: 345 10*3/uL (ref 150–400)
RBC: 4.16 MIL/uL — ABNORMAL LOW (ref 4.22–5.81)
RDW: 14.4 % (ref 11.5–15.5)
WBC: 9.8 10*3/uL (ref 4.0–10.5)
nRBC: 0 % (ref 0.0–0.2)

## 2021-05-02 LAB — GLUCOSE, CAPILLARY
Glucose-Capillary: 83 mg/dL (ref 70–99)
Glucose-Capillary: 79 mg/dL (ref 70–99)
Glucose-Capillary: 106 mg/dL — ABNORMAL HIGH (ref 70–99)
Glucose-Capillary: 119 mg/dL — ABNORMAL HIGH (ref 70–99)
Glucose-Capillary: 143 mg/dL — ABNORMAL HIGH (ref 70–99)
Glucose-Capillary: 150 mg/dL — ABNORMAL HIGH (ref 70–99)

## 2021-05-02 LAB — BASIC METABOLIC PANEL
Anion gap: 8 (ref 5–15)
BUN: 12 mg/dL (ref 8–23)
CO2: 30 mmol/L (ref 22–32)
Calcium: 9 mg/dL (ref 8.9–10.3)
Chloride: 93 mmol/L — ABNORMAL LOW (ref 98–111)
Creatinine, Ser: 0.58 mg/dL — ABNORMAL LOW (ref 0.61–1.24)
GFR, Estimated: >60 mL/min (ref >60)
Glucose, Bld: 89 mg/dL (ref 70–99)
Potassium: 3.8 mmol/L (ref 3.5–5.1)
Sodium: 131 mmol/L — ABNORMAL LOW (ref 135–145)

## 2021-05-02 LAB — PHOSPHORUS: Phosphorus: 3.5 mg/dL (ref 2.5–4.6)

## 2021-05-02 LAB — MAGNESIUM: Magnesium: 1.8 mg/dL (ref 1.7–2.4)

## 2021-05-02 NOTE — Progress Notes (Signed)
NAME:  Brandon [name], MRN:  [id number], DOB:  [birth year], LOS: 4 ADMISSION DATE:  04/28/2021, CONSULTATION DATE:  04/28/21 REFERRING MD:  Dr. Roxan Hockey, CHIEF COMPLAINT: Altered mental status  History of Present Illness:  65 year old male presenting to [facility] ED on 04/28/2021 from SN[facility] via EMS.  Staff at [facility] called EMS after patient was found to be unresponsive and reportedly hypotensive in the 50s for an unknown amount of time.  Staff had stated earlier they thought the patient had been napping.  EMS started IV fluids and continued chronic oxygen in route, with reports of blood pressure improvement.  Per ED documentation upon arrival patient responsive to sternal rub, altered and not following commands. ED course: Patient initially appeared to improve with IV fluids and was protecting his airway.  However within 2 hours after arrival patient became hypoxic with SPO2 in the 70s and was too confused to safely tolerate BiPAP requiring emergent intubation and mechanical ventilatory support.  Of note this is the patient's sixth hospital admission in 2022 for a combination of hyponatremia/hypoxia with or without unresponsiveness.  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 Initial vitals: Afebrile at 96.4, tachypneic at 26, NSR at 79, BP marginal at 95/69 with SPO2 99% on 4 L nasal cannula.  Significant labs: Labs/ Imaging personally reviewed CXR 04/28/2021 (before intubation): Linear atelectasis or fibrosis in the left midlung and right lung base no focal consolidation. CT head without contrast & CT cervical spine without contrast 04/28/2021: No acute intracranial abnormalities, mild cerebral atrophy.  Diffuse degenerative change in the cervical spine no acute displaced fractures.  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 04/29/2021-Pt successfully extubated to 4L O2 via nasal canula  04/30/2021 - Patient becoming more lethargic this morning, became unarousable with ABG showing hypercapnia, PCCM re-consulted.  Reintubated.  Precedex started briefly overnight 9/21 Extubated  Interim History / Subjective:   Extubated yesterday.  Restarted on low-dose clozapine by psychiatry. Complains of insomnia  Objective   Blood pressure (!) 143/92, pulse 85, temperature 98.2 F (36.8 C), temperature source Oral, resp. rate (!) 26, weight 82.4 kg, SpO2 100 %.        Intake/Output Summary (Last 24 hours) at 05/02/2021 0844 Last data filed at 05/02/2021 0300 Gross per 24 hour  Intake 278.48 ml  Output 1175 ml  Net -896.52 ml   Filed Weights   04/28/21 2057 04/29/21 0213 05/02/21 0352  Weight: 85.2 kg 86.4 kg 82.4 kg    Examination: Gen:      No acute distress HEENT:  EOMI, sclera anicteric Neck:     No masses; no thyromegaly Lungs:    Clear to auscultation bilaterally; normal respiratory effort CV:         Regular rate and rhythm; no murmurs Abd:      + bowel sounds; soft, non-tender; no palpable masses, no distension Ext:    No edema; adequate peripheral perfusion Skin:      Warm and dry; no rash Neuro: Mild confusion  Lab/imaging reviewed Labs are stable Chest x-ray with mild basilar atelectasis  Resolved Hospital Problem list     Assessment & Plan:  Acute Encephalopathy multifactorial in the setting of hypoxia and possibly medication induced (psychiatric and/or antihypertensive   Possible syncopal episodes  PMHx: Schizophrenia, Seizures (ETOH withdrawal related), ETOH abuse UDS + for tricyclics (however upon Rx consultation Wellbutrin can cause + tricyclic result). CTH/Cervical Spine negative for abnormality - Psych consulted and clozaril restarted 9/19 at 100mg .  He then had to be reintubated the next day for obtundation.  Concerned that this medication is causing excessive sedation.  Continue at lower dose per psychiatry - Continue outpatient wellbutrin sr and prn vistaril  - Frequent reorientation  - PT/OT consult   Acute on chronic combined hypoxic & Hypercapnic Respiratory Failure secondary to suspected aspiration in the setting of acute encephalopathy Suspected Aspiration Pneumonia PMHx: COPD Stable post extubation Continue bronchodilators Stop solumedrol as it may be contributing to encephalopathy  Orthostatic Hypotension - Cardiology notified about patient's orthostasis and recommendation on his outpatient regimen.  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  He states his brother does have a wife, however she currently resides at a SNF and he does not have her contact info.  He does state his brother has informed him he would like him to be his HCPOA in the event the pt is unable to make decisions for himself.  Pts RN has consulted [facility] to assist the pt and  pts brother Taris Galindo with completing the POA paperwork.   Critical care time: NA   Roosevelt Locks MD [facility] See Amion for pager  If no response to pager , please call [phone] until 7pm After 7:00 pm call Elink  [phone] 05/02/2021, 8:44 AM

## 2021-05-02 NOTE — Evaluation (Signed)
Physical Therapy Re-Evaluation Patient Details Name: Brandon [name] MRN: [id number] DOB: 09-13-1955 Today's Date: 05/02/2021  History of Present Illness  Pt is a 65 year old male with PMH including schizophrenia, seizures (ETOH withdrawal related), and ETOH abuse presenting to [facility] ED on 04/28/2021 from SN[facility] via EMS.  Staff at [facility] called EMS after patient was found to be unresponsive and reportedly hypotensive in the 50s for an unknown amount of time.  At [facility] patient became hypoxic with SPO2 in the 70s and was too confused to safely tolerate BiPAP requiring emergent intubation and mechanical ventilatory support. Pt was eventully extubated but needed to be reintubated 9/20 and was again extubated 9/21.  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   Clinical Impression  Pt somewhat lethargic upon entering room but responded to verbal stimulation and became more alert as the session progressed.  The pt struggled to produce speech that was loud enough to be heard but was able to follow simple commands with occasional extra time and cuing.  Pt required extensive +2 physical assist for functional tasks but was able to come to standing for around 20 sec although was unable to fully extend his knees or to take a step at the EOB.  Pt's SpO2 and HR were both WNL during the session.  Pt will benefit from PT services in a SNF setting upon discharge to safely address deficits listed in patient problem list for decreased caregiver assistance and eventual return to PLOF.          Recommendations for follow up therapy are one component of a multi-disciplinary discharge planning process, led by the  attending physician.  Recommendations may be updated based on patient status, additional functional criteria and insurance authorization.  Follow Up Recommendations SNF;Supervision/Assistance - 24 hour    Equipment Recommendations  Other (comment) (TBD at next venue of care)    Recommendations for Other Services       Precautions / Restrictions Precautions Precautions: Fall Restrictions Weight Bearing Restrictions: No      Mobility  Bed Mobility Overal bed mobility: Needs Assistance Bed Mobility: Rolling;Supine to Sit;Sit to Supine Rolling: Mod assist   Supine to sit: Max assist;+2 for physical assistance Sit to supine: Max assist;+2 for physical assistance   General bed mobility comments: Max A for BLE and trunk control    Transfers Overall transfer level: Needs assistance Equipment used: Rolling walker (2 wheeled) Transfers: Sit to/from Stand Sit to Stand: Max assist;+2 physical assistance;From elevated surface         General transfer comment: Pt able to stand from an elevated surface with heavy +2 assist but was unable to fully extend bilateral knees or to take a step; max standing tolernace around 20 sec  Ambulation/Gait             General Gait Details: unable  Stairs            Wheelchair Mobility    Modified [name] (Stroke Patients Only)       Balance Overall balance assessment: Needs assistance Sitting-balance support: Bilateral upper extremity supported;Feet supported Sitting balance-[name] Scale: Fair Sitting balance - Comments: Pt initially required min A for stability in sitting but quickly progressed to SBA   Standing balance support: Bilateral upper extremity supported;During functional activity  Standing balance-[name] Scale: Poor Standing balance comment: +2 assist to maintain standing balance                             Pertinent Vitals/Pain Pain Assessment: No/denies pain    Home Living Family/patient expects  to be discharged to:: Skilled nursing facility               Home Equipment: Dan Humphreys - 2 wheels;Cane - single point Additional Comments: History and PLOF taken from chart review secondary to pt unable to provide information    Prior Function Level of Independence: Needs assistance   Gait / Transfers Assistance Needed: Ambulatory with SPC in home, RW in community. Has been using RW at [facility]  ADL's / Homemaking Assistance Needed: Independent with ADLs and cooking. Family assists with other IADLs        Hand Dominance   Dominant Hand: Right    Extremity/Trunk Assessment   Upper Extremity Assessment Upper Extremity Assessment: Generalized weakness    Lower Extremity Assessment Lower Extremity Assessment: Generalized weakness       Communication   Communication: Other (comment) (Pt had difficulty speaking loudly enough to where it could be heard but able to follow commands well)  Cognition Arousal/Alertness: Lethargic Behavior During Therapy: [facility] for tasks assessed/performed Overall Cognitive Status: No family/caregiver present to determine baseline cognitive functioning                                 General Comments: Pt had difficulty producing speech loudly enough to be heard, difficult to assess cognition.  Pt was able to follow most commands with occasional extra time and cuing      General Comments      Exercises Total Joint Exercises Ankle Circles/Pumps: AROM;Strengthening;Both;10 reps Quad Sets: 10 reps;Both;Strengthening Gluteal Sets: Strengthening;Both;10 reps Heel Slides: AAROM;Strengthening;Both;5 reps Hip ABduction/ADduction: 10 reps;Both;Strengthening;AAROM Straight Leg Raises: AAROM;Strengthening;Both;10 reps Long Arc Quad: AROM;Strengthening;Both;5 reps (limited amplitude)   Assessment/Plan    PT Assessment Patient needs continued PT services  PT Problem List Decreased strength;Decreased mobility;Decreased balance;Decreased activity  tolerance;Decreased safety awareness;Decreased knowledge of use of DME       PT Treatment Interventions DME instruction;Therapeutic activities;Gait training;Therapeutic exercise;Patient/family education;Balance training;Functional mobility training    PT Goals (Current goals can be found in the Care Plan section)  Acute Rehab PT Goals PT Goal Formulation: Patient unable to participate in goal setting Time For Goal Achievement: 05/15/21 Potential to Achieve Goals: Fair    Frequency Min 2X/week   Barriers to discharge        Co-evaluation               AM-PAC PT "6 Clicks" Mobility  Outcome Measure Help needed turning from your back to your side while in a flat bed without using bedrails?: A Lot Help needed moving from lying on your back to sitting on the side of a flat bed without using bedrails?: Total Help needed moving to and from a bed to a chair (including a wheelchair)?: Total Help needed standing up from a chair using your arms (e.g., wheelchair or bedside chair)?: Total Help needed to walk in hospital room?: Total Help needed climbing 3-5 steps with a railing? : Total 6 Click Score: 7    End of Session Equipment Utilized During Treatment: Oxygen;Gait belt Activity Tolerance: Patient tolerated treatment well Patient left: in bed;with call bell/phone within reach;with bed  alarm set Nurse Communication: Mobility status PT Visit Diagnosis: Unsteadiness on feet (R26.81);Muscle weakness (generalized) (M62.81);Difficulty in walking, not elsewhere classified (R26.2)    Time: 4270-6237 PT Time Calculation (min) (ACUTE ONLY): 28 min   Charges:   PT Evaluation $PT Re-evaluation: 1 Re-eval PT Treatments $Therapeutic Exercise: 8-22 mins       D. Elly Modena PT, DPT 05/02/21, 3:41 PM

## 2021-05-02 NOTE — Progress Notes (Signed)
   05/02/21 1700  Clinical Encounter Type  Visited With Patient and family together  Visit Type Follow-up;Spiritual support  Referral From Chaplain;Nurse  Consult/Referral To Chaplain  Spiritual Encounters  Spiritual Needs Emotional;Prayer  Chaplain Oleta Mouse responded to a page for spiritual support in ICU room 13. One family member was present when I arrived who stated she was his wife. I entered and introduced myself and Brandon [name]  whispered softly and asked me my name again, and then he smile. The wife began to tell me about Brandon [name] condition and she said I know he is in God hands, will you pray for him. I said, you are exactly right and yes I will pray. After the prayer I used reflective listening and words of encouragement as his wife began to tell about Brandon [name]'s daughter who is a Optician, dispensing and we spoke of his daughter Brandon [name] although laboured with his breathing and speaking let out this big smile. I said you are proud of her arent you, and he knodded his head. His nurse came and asked to speak to me in the hallway and she advised me of a situation and responded by informing her I will return when Brandon [name] is alone to follow up.

## 2021-05-02 NOTE — Progress Notes (Signed)
SUBJECTIVE: 65 year old male presented from [facility] via EMS for unresponsiveness, hypotensive in the 50s. Blood pressure improved with IV fluids.  Patient extubated 05/01/21 morning. Today he is alert. Denies chest pain. On 2L nasal cannula.  Vitals:   05/02/21 0600 05/02/21 0630 05/02/21 0700 05/02/21 0800  BP: (!) 149/90 (!) 142/115 (!) 143/92   Pulse: 85 91 85   Resp: (!) 29 (!) 25 (!) 26   Temp:      TempSrc:      SpO2: 99% 100% 100% 100%  Weight:        Intake/Output Summary (Last 24 hours) at 05/02/2021 0846 Last data filed at 05/02/2021 0300 Gross per 24 hour  Intake 278.48 ml  Output 1175 ml  Net -896.52 ml    LABS: Basic Metabolic Panel: Recent Labs    04/30/21 0148 04/30/21 0541 05/01/21 0504 05/02/21 0519  NA 130*   < > 130* 131*  K 3.7  --  4.0 3.8  CL 97*  --  94* 93*  CO2 28  --  29 30  GLUCOSE 95  --  110* 89  BUN 7*  --  11 12  CREATININE 0.63  --  0.53* 0.58*  CALCIUM 8.5*  --  8.8* 9.0  MG 1.7  --  1.8 1.8  PHOS 3.1  --   --  3.5   < > = values in this interval not displayed.   Liver Function Tests: Recent Labs    05/01/21 0504  AST 19  ALT 16  ALKPHOS 81  BILITOT 0.8  PROT 5.2*  ALBUMIN 2.7*   No results for input(s): LIPASE, AMYLASE in the last 72 hours. CBC: Recent Labs    04/30/21 0148 05/02/21 0519  WBC 8.2 9.8  NEUTROABS 6.1  --   HGB 11.7* 12.3*  HCT 35.1* 36.8*  MCV 87.5 88.5  PLT 302 345   Cardiac Enzymes: No results for input(s): CKTOTAL, CKMB, CKMBINDEX, TROPONINI in the last 72 hours. BNP: Invalid input(s): POCBNP D-Dimer: No results for input(s): DDIMER in the last 72 hours. Hemoglobin A1C: No results for input(s): HGBA1C in the last 72 hours. Fasting Lipid Panel: No results for input(s): CHOL, HDL, LDLCALC, TRIG, CHOLHDL, LDLDIRECT in the last 72 hours. Thyroid Function Tests: No results for input(s): TSH, T4TOTAL, T3FREE, THYROIDAB in the last 72 hours.  Invalid input(s): FREET3 Anemia Panel: No  results for input(s): VITAMINB12, FOLATE, FERRITIN, TIBC, IRON, RETICCTPCT in the last 72 hours.   PHYSICAL EXAM General: Well developed, well nourished, in no acute distress HEENT:  Normocephalic and atramatic Neck:  No JVD.  Lungs: Clear bilaterally to auscultation and percussion. Heart: HRRR . Normal S1 and S2 without gallops or murmurs.  Abdomen: Bowel sounds are positive, abdomen soft and non-tender  Msk:  Back normal, normal gait. Normal strength and tone for age. Extremities: No clubbing, cyanosis or edema.   Neuro: Alert and oriented X 3. Psych:  Good affect, responds appropriately  TELEMETRY: NSR, HR 88  ASSESSMENT AND PLAN: B/p improved after restarting Irbesartan and Isosorbide. Continues to deny chest pain. Continue to observe.   Principal Problem:   Schizoaffective disorder, bipolar type (HCC) Active Problems:   Coronary artery disease   Essential hypertension   COPD (chronic obstructive pulmonary disease) (HCC)   Acute hyponatremia   Schizophrenia (HCC)   BPH (benign prostatic hyperplasia)   Acute respiratory failure with hypoxia and hypercapnia (HCC)   Acute metabolic encephalopathy   Endotracheally intubated   On mechanically  assisted ventilation (HCC)    Arthea Nobel, FNP-C 05/02/2021 8:46 AM

## 2021-05-02 NOTE — Progress Notes (Signed)
Chaplain Maggie offered spiritual support at bedside when delivering a prayer blanket. We talked about how the blanket knit by local church members may be a sign of the community of faith and the prayers of God's people. The blanket may bring comfort during sorrow as it covers pt along with the prayers of yesterday, today and tomorrow.

## 2021-05-03 ENCOUNTER — Inpatient Hospital Stay: Payer: Medicare Other

## 2021-05-03 DIAGNOSIS — F250 Schizoaffective disorder, bipolar type: Secondary | ICD-10-CM | POA: Diagnosis not present

## 2021-05-03 LAB — BLOOD GAS, ARTERIAL
Acid-Base Excess: 4.9 mmol/L — ABNORMAL HIGH (ref 0.0–2.0)
Bicarbonate: 29.9 mmol/L — ABNORMAL HIGH (ref 20.0–28.0)
FIO2: 0.5
O2 Saturation: 94.1 %
Patient temperature: 37
pCO2 arterial: 45 mmHg (ref 32.0–48.0)
pH, Arterial: 7.43 (ref 7.350–7.450)
pO2, Arterial: 69 mmHg — ABNORMAL LOW (ref 83.0–108.0)

## 2021-05-03 LAB — GLUCOSE, CAPILLARY
Glucose-Capillary: 101 mg/dL — ABNORMAL HIGH (ref 70–99)
Glucose-Capillary: 103 mg/dL — ABNORMAL HIGH (ref 70–99)
Glucose-Capillary: 91 mg/dL (ref 70–99)
Glucose-Capillary: 84 mg/dL (ref 70–99)
Glucose-Capillary: 133 mg/dL — ABNORMAL HIGH (ref 70–99)

## 2021-05-03 LAB — CULTURE, BLOOD (ROUTINE X 2)
Culture: NO GROWTH
Culture: NO GROWTH
Special Requests: ADEQUATE

## 2021-05-03 MED ORDER — BUDESONIDE 0.5 MG/2ML IN SUSP
0.5000 mg | Freq: Two times a day (BID) | RESPIRATORY_TRACT | Status: DC
  Administered 2021-05-03 – 2021-05-17 (×28): 0.5 mg via RESPIRATORY_TRACT
  Filled 2021-05-03 (×26): qty 2

## 2021-05-03 MED ORDER — BISACODYL 10 MG RE SUPP
10.0000 mg | Freq: Every day | RECTAL | Status: DC | PRN
  Administered 2021-05-03: 10 mg via RECTAL
  Filled 2021-05-03: qty 1

## 2021-05-03 MED ORDER — LACTATED RINGERS IV BOLUS
500.0000 mL | Freq: Once | INTRAVENOUS | Status: AC
  Administered 2021-05-03: 500 mL via INTRAVENOUS

## 2021-05-03 MED ORDER — LEVALBUTEROL HCL 1.25 MG/0.5ML IN NEBU
1.2500 mg | INHALATION_SOLUTION | Freq: Four times a day (QID) | RESPIRATORY_TRACT | Status: DC | PRN
  Administered 2021-05-03 – 2021-05-07 (×2): 1.25 mg via RESPIRATORY_TRACT
  Filled 2021-05-03 (×4): qty 0.5

## 2021-05-03 MED ORDER — BUDESONIDE 0.25 MG/2ML IN SUSP: 0.2500 mg | Freq: Two times a day (BID) | RESPIRATORY_TRACT | Status: DC

## 2021-05-03 NOTE — Progress Notes (Signed)
NAME:  Brandon [name], MRN:  [id number], DOB:  [birth year], LOS: 5 ADMISSION DATE:  04/28/2021, CONSULTATION DATE:  04/28/21 REFERRING MD:  Dr. Roxan Hockey, CHIEF COMPLAINT: Altered mental status  History of Present Illness:  65 year old male presenting to [facility] ED on 04/28/2021 from SN[facility] via EMS.  Staff at [facility] called EMS after patient was found to be unresponsive and reportedly hypotensive in the 50s for an unknown amount of time.  Staff had stated earlier they thought the patient had been napping.  EMS started IV fluids and continued chronic oxygen in route, with reports of blood pressure improvement.  Per ED documentation upon arrival patient responsive to sternal rub, altered and not following commands. ED course: Patient initially appeared to improve with IV fluids and was protecting his airway.  However within 2 hours after arrival patient became hypoxic with SPO2 in the 70s and was too confused to safely tolerate BiPAP requiring emergent intubation and mechanical ventilatory support.  Of note this is the patient's sixth hospital admission in 2022 for a combination of hyponatremia/hypoxia with or without unresponsiveness.  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 Initial vitals: Afebrile at 96.4, tachypneic at 26, NSR at 79, BP marginal at 95/69 with SPO2 99% on 4 L nasal cannula.  Significant labs: Labs/ Imaging personally reviewed CXR 04/28/2021 (before intubation): Linear atelectasis or fibrosis in the left midlung and right lung base no focal consolidation. CT head without contrast & CT cervical spine without contrast 04/28/2021: No acute intracranial abnormalities, mild cerebral atrophy.  Diffuse degenerative change in the cervical spine no acute displaced fractures.  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 04/29/2021-Pt successfully extubated to 4L O2 via nasal canula  04/30/2021 - Patient becoming more lethargic this morning, became unarousable with ABG showing hypercapnia, PCCM re-consulted.  Reintubated.  Precedex started briefly overnight 9/21 Extubated 9/23 patient has been transferred to [facility] however exhibited increased work of breathing and profound hypoxemia, PCCM assumed care again  Interim History / Subjective:   Respiratory distress this morning after Mucomyst nebulizer.  Exceedingly poor cough mechanics, poor mentation.  Oxygen saturations 85% on nasal cannula, transitioned to heated high flow.  Objective   Blood pressure 119/87, pulse (!) 103, temperature 98 F (36.7 C), temperature source Axillary, resp. rate (!) 22, weight 78.3 kg, SpO2 94 %.    FiO2 (%):  [55 %] 55 %   Intake/Output Summary (Last 24 hours) at 05/03/2021 0919 Last data filed at 05/03/2021 0300 Gross per 24 hour  Intake --  Output 1425 ml  Net -1425 ml    Filed Weights   04/29/21 0213 05/02/21 0352 05/03/21 0322  Weight: 86.4 kg 82.4 kg 78.3 kg    Examination: Gen:  Tachypneic, very poor cough mechanics, poorly responsive O2 sats 85% HEENT: EOMI, sclera anicteric Neck: No masses; no thyromegaly Lungs: Poor air movement bilaterally.  Diminished breath sounds on the left mid lung field/base. CV: Tachycardic with regular rate rhythm; no murmurs Abd: Positive bowel sounds; soft, non-tender; no palpable masses, no distension Ext: No edema; adequate peripheral perfusion Skin:  Warm and dry; no  rash Neuro: Confused, poorly responsive.  Lab/imaging reviewed: Chest x-ray shows worsening atelectasis on the left base  ABG    Component Value Date/Time   PHART 7.43 05/03/2021 0939   PCO2ART 45 05/03/2021 0939   PO2ART 69 (L) 05/03/2021 0939   HCO3 29.9 (H) 05/03/2021 0939   O2SAT 94.1 05/03/2021 0939      Resolved Hospital Problem list     Assessment & Plan:  Acute Encephalopathy multifactorial in the setting of hypoxia and possibly medication induced (psychiatric and/or antihypertensive  Possible syncopal episodes  PMHx: Schizophrenia, Seizures (ETOH withdrawal related), ETOH abuse UDS + for tricyclics (however upon Rx consultation Wellbutrin can cause + tricyclic result). CTH/Cervical Spine negative for abnormality - Psych consulted and clozaril restarted 9/19 at 100mg .  He then had to be reintubated the next day for obtundation.  Concerned that this medication is causing excessive sedation.  Continue at lower dose per psychiatry - Continue outpatient wellbutrin sr and prn vistaril  - Frequent reorientation  - PT/OT consult   Acute on chronic combined hypoxic & Hypercapnic Respiratory Failure secondary to suspected aspiration in the setting of acute encephalopathy Suspected Aspiration Pneumonia Very poor cough mechanics PMHx: Advanced COPD Retained secretions/atelectasis Questionable reaction to Mucomyst Discontinue Mucomyst Add Pulmicort twice a day Chest vest physiotherapy Continue bronchodilators Stoped solumedrol as it may be contributing to encephalopathy  Orthostatic Hypotension - Cardiology notified about patient's orthostasis and recommendation on his outpatient regimen  Acute on chronic hyponatremia secondary to suspected hypovolemia/dehydration in the setting of 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  He states his brother does have a wife, however she currently resides at a SNF and he does not have her contact info.  He does state his brother has informed him he would like him to be his HCPOA in the event the pt is unable to make decisions for himself.  Patient's brother has completed HCPOA's paperwork.  He is now making decisions for his brother.  We discussed the issues with his debility, frailty, advanced COPD and terrible cough mechanics.  If the patient goes back on the ventilator suspect that he would need ongoing ventilator support and would likely not wean.  Patient's brother states that his brother would never want to be on prolonged mechanical ventilation and would like to have palliative care consultation.  He also would like his brother to be DNR/DNI.  Critical care time: 45 minutes   C. 10/23, MD Advanced Bronchoscopy PCCM [facility]  05/03/2021, 9:19 AM    *This note was dictated using voice recognition software/Dragon.  Despite best efforts to proofread, errors can occur which can change the meaning.  Any change was purely unintentional.

## 2021-05-03 NOTE — Progress Notes (Signed)
OT Cancellation Note  Patient Details Name: Brandon [name] MRN: [id number] DOB: 16-Aug-1955   Cancelled Treatment:    Reason Eval/Treat Not Completed: Medical issues which prohibited therapy. Orders received for re-evaluation s/p reintubation 9/20 and subsequent extubation 9/21. Per RN, pt continues to have unstable respiratory status, current MEWS score 5. RN requesting hold therapy services this date. Will follow up as appropriate.   Kathie Dike, M.S. OTR/L  05/03/21, 1:30 PM  ascom [phone]

## 2021-05-03 NOTE — Progress Notes (Signed)
Spoke with Freddy Jaksch K regarding patients blood pressure being on the 70's. He will place orders for bolus. Continue to assess.

## 2021-05-03 NOTE — TOC Initial Note (Signed)
Transition of Care [facility]) - Initial/Assessment Note    Patient Details  Name: Brandon [name] MRN: [id number] Date of Birth: [birth year]  Transition of Care [facility]) CM/SW Contact:    Marina Goodell Phone Number: [phone] 05/03/2021, 4:12 PM  Clinical Narrative:                  Patient presents to [facility] from [facility] after he was found unresponsive.  Patient was at [facility] for SNF rehab.  This is the patient's 6th hospital admission this year.  Patietn's main contact is Yosgart, Pavey (Brother)   [phone] [facility] Phone) HCPOA.  CSW spoke with Mr. Charlestine Night who stated he understands the seriousness if the patient's condition.  Mr. Rodena Piety stated he would like to speak with Palliative Care about goals of care.  CSW requested palliative care consult, ICU RN has made consult order.  Mr. Rodena Piety stated he will be available tomorrow 05/04/2021.  Expected Discharge Plan: Home w Hospice Care Barriers to Discharge: No Barriers Identified   Patient Goals and CMS Choice        Expected Discharge Plan and Services Expected Discharge Plan: Home w Hospice Care In-house Referral: Clinical Social Work     Living arrangements for the past 2 months: Apartment                                      Prior Living Arrangements/Services Living arrangements for the past 2 months: Apartment Lives with:: Self Patient language and need for interpreter reviewed:: Yes Do you feel safe going back to the place where you live?: Yes      Need for Family Participation in Patient Care: Yes (Comment) Care giver support system in place?: Yes (comment)   Criminal Activity/Legal Involvement Pertinent to Current Situation/Hospitalization: No - Comment as needed  Activities of Daily Living      Permission Sought/Granted Permission sought to share information with : Facility Medical sales representative    Share Information with NAME: Chaske, Paskett (Brother)   [phone] (Home Phone) HCPOA            Emotional Assessment Appearance:: Appears older than stated age Attitude/Demeanor/Rapport: Unable to Assess Affect (typically observed): Unable to Assess   Alcohol / Substance Use: Alcohol Use Psych Involvement: No (comment)  Admission diagnosis:  Acute respiratory failure (HCC) [J96.00] Hyponatremia [E87.1] Acute respiratory failure with hypoxia (HCC) [J96.01] Unresponsiveness [R41.89] Acute metabolic encephalopathy [G93.41] Patient Active Problem List   Diagnosis Date Noted   Endotracheally intubated 04/29/2021   On mechanically assisted ventilation (HCC) 04/29/2021   Acute metabolic encephalopathy 04/28/2021   Hypotension 04/28/2021   Acute respiratory failure (HCC) 04/28/2021   Shortness of breath 04/12/2021   Acute respiratory failure with hypoxia and hypercapnia (HCC)    Aspiration pneumonia of both lungs due to gastric secretions (HCC)    Lobar pneumonia, unspecified organism (HCC)    Vitamin D deficiency    Epigastric abdominal pain    Recurrent falls    Hyponatremia 03/22/2021   BPH (benign prostatic hyperplasia) 03/22/2021   Weakness    Schizophrenia (HCC)    Acute hyponatremia 11/27/2020   Appendicitis with perforation 07/17/2018   Acute cholecystitis 04/10/2018   Chronic cholecystitis 04/08/2018   Atherosclerosis of native arteries of extremity with intermittent claudication (HCC) 11/22/2017   Essential hypertension 11/22/2017   Hyperlipidemia 11/22/2017   COPD (chronic obstructive pulmonary disease) (HCC) 11/22/2017   Coronary artery disease  10/15/2017   Other toe(s) amputation status 11/17/2014   Schizoaffective disorder, bipolar type (HCC) 08/23/2008   PCP:  Sherron Monday, MD Pharmacy:   [facility] - [place], [place] - [address] [place] [address] Phone: [phone] Fax: [phone]     Social Determinants of Health (SDOH) Interventions    Readmission Risk Interventions Readmission Risk  Prevention Plan 04/17/2021 04/09/2021 03/24/2021  Transportation Screening Complete Complete Complete  PCP or Specialist Appt within 5-7 Days - - Complete  Home Care Screening - - Complete  Medication Review (RN CM) - - Complete  Medication Review (RN Care Manager) - Complete -  PCP or Specialist appointment within 3-5 days of discharge Complete Complete -  HRI or Home Care Consult Complete Complete -  SW Recovery Care/Counseling Consult Complete Complete -  Palliative Care Screening Not Applicable Not Applicable -  Skilled Nursing Facility Complete Complete -  Some recent data might be hidden

## 2021-05-03 NOTE — Progress Notes (Signed)
Chaplin notified that patient request to speak with him. Chaplin called back and stated currently in ED and will be up asap to speak with patient. Patient updated and verbalized understanding.

## 2021-05-03 NOTE — Progress Notes (Signed)
SUBJECTIVE: 65 year old male presented from [facility] via EMS for unresponsiveness, hypotensive in the 50s. Blood pressure improved with IV fluids.  Patient alert, sitting up. RN feeding patient breakfast. Denies chest pain.    Vitals:   05/03/21 0600 05/03/21 0700 05/03/21 0753 05/03/21 0808  BP: 135/82 (!) 147/93    Pulse: 82 94    Resp: (!) 21 (!) 31    Temp:      TempSrc:      SpO2: 100% 92% 93% 94%  Weight:        Intake/Output Summary (Last 24 hours) at 05/03/2021 0844 Last data filed at 05/03/2021 0300 Gross per 24 hour  Intake --  Output 1425 ml  Net -1425 ml    LABS: Basic Metabolic Panel: Recent Labs    05/01/21 0504 05/02/21 0519  NA 130* 131*  K 4.0 3.8  CL 94* 93*  CO2 29 30  GLUCOSE 110* 89  BUN 11 12  CREATININE 0.53* 0.58*  CALCIUM 8.8* 9.0  MG 1.8 1.8  PHOS  --  3.5   Liver Function Tests: Recent Labs    05/01/21 0504  AST 19  ALT 16  ALKPHOS 81  BILITOT 0.8  PROT 5.2*  ALBUMIN 2.7*   No results for input(s): LIPASE, AMYLASE in the last 72 hours. CBC: Recent Labs    05/02/21 0519  WBC 9.8  HGB 12.3*  HCT 36.8*  MCV 88.5  PLT 345   Cardiac Enzymes: No results for input(s): CKTOTAL, CKMB, CKMBINDEX, TROPONINI in the last 72 hours. BNP: Invalid input(s): POCBNP D-Dimer: No results for input(s): DDIMER in the last 72 hours. Hemoglobin A1C: No results for input(s): HGBA1C in the last 72 hours. Fasting Lipid Panel: No results for input(s): CHOL, HDL, LDLCALC, TRIG, CHOLHDL, LDLDIRECT in the last 72 hours. Thyroid Function Tests: No results for input(s): TSH, T4TOTAL, T3FREE, THYROIDAB in the last 72 hours.  Invalid input(s): FREET3 Anemia Panel: No results for input(s): VITAMINB12, FOLATE, FERRITIN, TIBC, IRON, RETICCTPCT in the last 72 hours.   PHYSICAL EXAM General: Well developed, well nourished, in no acute distress HEENT:  Normocephalic and atramatic Neck:  No JVD.  Lungs: Clear bilaterally to auscultation and  percussion. Heart: HRRR . Normal S1 and S2 without gallops or murmurs.  Abdomen: Bowel sounds are positive, abdomen soft and non-tender  Msk:  Back normal, normal gait. Normal strength and tone for age. Extremities: No clubbing, cyanosis or edema.   Neuro: Alert and oriented X 3. Psych:  Good affect, responds appropriately  TELEMETRY: sinus tachycardia, HR 116  ASSESSMENT AND PLAN: Patient feeling better. HR elevated. Consider adding carvedilol 6.25 mg twice daily. If HR continues to be elevated, can titrate up to 12.5 mg twice daily.  Principal Problem:   Schizoaffective disorder, bipolar type (HCC) Active Problems:   Coronary artery disease   Essential hypertension   COPD (chronic obstructive pulmonary disease) (HCC)   Acute hyponatremia   Schizophrenia (HCC)   BPH (benign prostatic hyperplasia)   Acute respiratory failure with hypoxia and hypercapnia (HCC)   Acute metabolic encephalopathy   Endotracheally intubated   On mechanically assisted ventilation (HCC)    Danial Hlavac, FNP-C 05/03/2021 8:44 AM

## 2021-05-03 NOTE — Progress Notes (Signed)
Chart reviewed with events of the day being noted. Pt visited in attempt to reassess swallowing safety. Pt is lethargic and now on high flow nasal canula. No appropriate for Po's with lethargy risking aspiration and need for reintubation. Will make NPO excetp meds crushed if alert. ST to follow up and reassess as indicated to restart diet when more alert.

## 2021-05-03 NOTE — Progress Notes (Signed)
Patient having labored breathing, oxygen dropping to mid 80's. Respitory called and Dr. Jayme Cloud into see patient. Continue to monitor.

## 2021-05-04 ENCOUNTER — Inpatient Hospital Stay: Payer: Medicare Other

## 2021-05-04 DIAGNOSIS — F250 Schizoaffective disorder, bipolar type: Secondary | ICD-10-CM | POA: Diagnosis not present

## 2021-05-04 LAB — CBC
HCT: 38.6 % — ABNORMAL LOW (ref 39.0–52.0)
Hemoglobin: 12.1 g/dL — ABNORMAL LOW (ref 13.0–17.0)
MCH: 28.3 pg (ref 26.0–34.0)
MCHC: 31.3 g/dL (ref 30.0–36.0)
MCV: 90.4 fL (ref 80.0–100.0)
Platelets: 287 10*3/uL (ref 150–400)
RBC: 4.27 MIL/uL (ref 4.22–5.81)
RDW: 14.1 % (ref 11.5–15.5)
WBC: 10 10*3/uL (ref 4.0–10.5)
nRBC: 0 % (ref 0.0–0.2)

## 2021-05-04 LAB — BASIC METABOLIC PANEL
Anion gap: 9 (ref 5–15)
BUN: 13 mg/dL (ref 8–23)
CO2: 30 mmol/L (ref 22–32)
Calcium: 8.4 mg/dL — ABNORMAL LOW (ref 8.9–10.3)
Chloride: 95 mmol/L — ABNORMAL LOW (ref 98–111)
Creatinine, Ser: 0.55 mg/dL — ABNORMAL LOW (ref 0.61–1.24)
GFR, Estimated: >60 mL/min (ref >60)
Glucose, Bld: 78 mg/dL (ref 70–99)
Potassium: 3.6 mmol/L (ref 3.5–5.1)
Sodium: 134 mmol/L — ABNORMAL LOW (ref 135–145)

## 2021-05-04 LAB — GLUCOSE, CAPILLARY
Glucose-Capillary: 103 mg/dL — ABNORMAL HIGH (ref 70–99)
Glucose-Capillary: 69 mg/dL — ABNORMAL LOW (ref 70–99)
Glucose-Capillary: 83 mg/dL (ref 70–99)
Glucose-Capillary: 95 mg/dL (ref 70–99)
Glucose-Capillary: 97 mg/dL (ref 70–99)
Glucose-Capillary: 97 mg/dL (ref 70–99)

## 2021-05-04 LAB — PHOSPHORUS: Phosphorus: 4.3 mg/dL (ref 2.5–4.6)

## 2021-05-04 LAB — MAGNESIUM: Magnesium: 1.7 mg/dL (ref 1.7–2.4)

## 2021-05-04 MED ORDER — POTASSIUM CHLORIDE CRYS ER 20 MEQ PO TBCR
40.0000 meq | EXTENDED_RELEASE_TABLET | Freq: Once | ORAL | Status: AC
  Administered 2021-05-04: 40 meq via ORAL
  Filled 2021-05-04: qty 2

## 2021-05-04 MED ORDER — DEXTROSE IN LACTATED RINGERS 5 % IV SOLN: INTRAVENOUS | Status: DC

## 2021-05-04 MED ORDER — SODIUM CHLORIDE 0.9 % IV SOLN
INTRAVENOUS | Status: DC | PRN
  Administered 2021-05-04: 250 mL via INTRAVENOUS

## 2021-05-04 MED ORDER — MAGNESIUM SULFATE 2 GM/50ML IV SOLN
2.0000 g | Freq: Once | INTRAVENOUS | Status: AC
  Administered 2021-05-04: 2 g via INTRAVENOUS
  Filled 2021-05-04 (×2): qty 50

## 2021-05-04 NOTE — Evaluation (Signed)
Occupational Therapy re-Evaluation Patient Details Name: LORENA CLEARMAN MRN: [id number] DOB: [birth year] Today's Date: 05/04/2021   History of Present Illness Pt is a 65 year old male with PMH including schizophrenia, seizures (ETOH withdrawal related), and ETOH abuse presenting to [facility] ED on 04/28/2021 from SNF [facility] via EMS.  Staff at [facility] called EMS after patient was found to be unresponsive and reportedly hypotensive in the 50s for an unknown amount of time.  At [facility] patient became hypoxic with SPO2 in the 70s and was too confused to safely tolerate BiPAP requiring emergent intubation and mechanical ventilatory support. Pt was eventully extubated but needed to be reintubated 9/20 and was again extubated 9/21.  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   Clinical Impression   Pt seen for OT re-evaluation this date in setting of prolonged hospitalization now with ICU stay s/p second intubation. Pt unable to provide much information, while he does appear to follow all commands well, he is hypophonic with very difficult to distinguish words, potentially related to second intubation. This date, pt requires MAX A with rolling and coming to sitting. Requires MIN A most of the time to sustain static sitting balance. Pt requires MAX/TOTAL A with LB ADLs d/t LE weakness and requires MIN to MOD A for UB ADLs in sitting d/t decreased sitting balance (better able to contribute in supported high-fowler's position in bed). Pt with general deconditioning d/t prolonged hospital stay with complications. Anticipate he will require extensive rehabilitation efforts, both in acute setting and subacute.  Recommend STR f/u OT.      Recommendations for follow up therapy are one component of a multi-disciplinary discharge planning process, led by the attending physician.  Recommendations may be updated based on patient status, additional functional criteria and insurance authorization.   Follow Up Recommendations  SNF;Supervision/Assistance - 24 hour    Equipment Recommendations  Other (comment) (defer)    Recommendations for Other Services       Precautions / Restrictions Precautions Precautions: Fall Restrictions Weight Bearing Restrictions: No      Mobility Bed Mobility Overal bed mobility: Needs Assistance Bed Mobility: Rolling;Supine to Sit;Sit to Supine Rolling: Max assist   Supine to sit: Max assist;HOB elevated Sit to supine: Max assist;HOB elevated   General bed mobility comments: increased time, MAX A to manage trunk and B LEs    Transfers                 General transfer comment: deferred    Balance Overall balance assessment: Needs assistance Sitting-balance support: Bilateral upper extremity supported;Feet supported Sitting balance-[name] Scale: Poor Sitting balance - Comments: required MIN A for most of time spent in static sitting, short bouts of maintaining static sitting for ~5-10 secs with UE Support after OT assisted to square hips and place hands to allow for seated support. Postural control: Posterior lean;Left lateral lean     Standing balance comment: deferred                           ADL either performed or assessed with clinical judgement   ADL Overall ADL's : Needs assistance/impaired  General ADL Comments: requires MOD A for seated UB ADLs, MAX/TOTAL A for bed level or seated UB ADLs, MAX A for bed mobility.     Vision   Additional Comments: diffiult to formally assess d/t difficulty communicating with pt. Appears to track appropriately.     Perception      Praxis      Pertinent Vitals/Pain Pain Assessment: No/denies pain     Hand Dominance Right   Extremity/Trunk Assessment Upper Extremity Assessment Upper Extremity Assessment: Generalized weakness   Lower Extremity Assessment Lower Extremity Assessment: Generalized weakness   Cervical / Trunk Assessment Cervical / Trunk Assessment: Kyphotic   Communication Communication Communication: Other (comment) (hypophonic, possibly since intubation.)   Cognition Arousal/Alertness: Awake/alert Behavior During Therapy: WFL for tasks assessed/performed Overall Cognitive Status: No family/caregiver present to determine baseline cognitive functioning Area of Impairment: Following commands;Safety/judgement;Awareness                       Following Commands: Follows one step commands with increased time Safety/Judgement: Decreased awareness of safety;Decreased awareness of deficits     General Comments: Pt had difficulty producing speech loudly enough to be heard, difficult to assess cognition.  Pt was able to follow most commands with occasional extra time and cuing   General Comments       Exercises Other Exercises Other Exercises: OT ed re: importance of OOB ACtivity for skin integrity. Pt with moderate reception   Shoulder Instructions      Home Living Family/patient expects to be discharged to:: Skilled nursing facility Living Arrangements: Alone Available Help at Discharge: Family;Friend(s);Available PRN/intermittently Type of Home: Apartment Home Access: Stairs to enter Entrance Stairs-Number of Steps: 5-6 Entrance Stairs-Rails: Left Home Layout: One level               Home Equipment: Walker - 2 wheels;Cane - single point   Additional Comments: History and PLOF taken from chart review secondary to pt unable to provide information      Prior Functioning/Environment Level of Independence: Needs assistance  Gait / Transfers Assistance Needed: Ambulatory  with SPC in home, RW in community. Has been using RW at [facility] ADL's / Homemaking Assistance Needed: Independent with ADLs and cooking. Family assists with other IADLs   Comments: Ambulatory with SPC in home, RW in community. Has been using RW at SNF        OT Problem List: Decreased strength;Decreased activity tolerance;Impaired balance (sitting and/or standing);Cardiopulmonary status limiting activity      OT Treatment/Interventions: Self-care/ADL training;Therapeutic exercise;Energy conservation;DME and/or AE instruction;Therapeutic activities;Patient/family education;Balance training    OT Goals(Current goals can be found in the care plan section) Acute Rehab OT Goals Patient Stated Goal: get better OT Goal Formulation: With patient Time For Goal Achievement: 05/18/21 Potential to Achieve Goals: Good ADL Goals Pt Will Perform Grooming: with min assist;sitting (with supported sitting EOB, for 1-2 g/h tasks to increase tolerance for OOB activity.) Pt Will Perform Lower Body Dressing: with mod assist;with max assist;sitting/lateral leans;with adaptive equipment Pt Will Transfer to Toilet: squat pivot transfer;with max assist;bedside commode (drop arm) Pt Will Perform Toileting - Clothing Manipulation and hygiene: with mod assist;with max assist;sitting/lateral leans Pt/caregiver will Perform Home Exercise Program: Increased strength;Both right and left upper extremity;With minimal assist Additional ADL Goal #1: Pt will demo F static sitting balance with UE support for 3 minutes EOB to improve trunk strength.  OT Frequency: Min 2X/week   Barriers to D/C:  Co-evaluation              AM-PAC OT "6 Clicks" Daily Activity     Outcome Measure Help from another person eating meals?: A Lot Help from another person taking care of personal grooming?: A Lot Help from another person toileting, which includes using toliet, bedpan, or urinal?: Total Help from another person  bathing (including washing, rinsing, drying)?: Total Help from another person to put on and taking off regular upper body clothing?: A Lot Help from another person to put on and taking off regular lower body clothing?: Total 6 Click Score: 9   End of Session Equipment Utilized During Treatment: Oxygen Nurse Communication: Mobility status  Activity Tolerance: Patient limited by fatigue;Patient tolerated treatment well Patient left: in bed;with call bell/phone within reach;with bed alarm set;with family/visitor present  OT Visit Diagnosis: Unsteadiness on feet (R26.81)                Time: 4166-0630 OT Time Calculation (min): 37 min Charges:  OT General Charges $OT Visit: 1 Visit OT Evaluation $OT Eval Moderate Complexity: 1 Mod OT Treatments $Self Care/Home Management : 8-22 mins $Therapeutic Activity: 8-22 mins  Rejeana Brock, MS, OTR/L ascom [phone] 05/04/21, 5:52 PM

## 2021-05-04 NOTE — Progress Notes (Signed)
Chaplin in to see patient per patient request.

## 2021-05-04 NOTE — Plan of Care (Signed)
Discussed with patient plan of care for the evening, pain management and medications for the evening with some teach back displayed.  No restraints on at this time  Problem: Safety: Goal: Non-violent Restraint(s) Outcome: Progressing

## 2021-05-04 NOTE — Progress Notes (Signed)
NAME:  Brandon [name], MRN:  [id number], DOB:  [birth year], LOS: 6 ADMISSION DATE:  04/28/2021, CONSULTATION DATE:  04/28/21 REFERRING MD:  Dr. Roxan Hockey, CHIEF COMPLAINT: Altered mental status  History of Present Illness:  65 year old male presenting to [facility] ED on 04/28/2021 from [facility] via EMS.  Staff at [facility] called EMS after patient was found to be unresponsive and reportedly hypotensive in the 50s for an unknown amount of time.  Staff had stated earlier they thought the patient had been napping.  EMS started IV fluids and continued chronic oxygen in route, with reports of blood pressure improvement.  Per ED documentation upon arrival patient responsive to sternal rub, altered and not following commands. ED course: Patient initially appeared to improve with IV fluids and was protecting his airway.  However within 2 hours after arrival patient became hypoxic with SPO2 in the 70s and was too confused to safely tolerate BiPAP requiring emergent intubation and mechanical ventilatory support.  Of note this is the patient's sixth hospital admission in 2022 for a combination of hyponatremia/hypoxia with or without unresponsiveness.  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 Initial vitals: Afebrile at 96.4, tachypneic at 26, NSR at 79, BP marginal at 95/69 with SPO2 99% on 4 L nasal cannula.  Significant labs: Labs/ Imaging personally reviewed CXR 04/28/2021 (before intubation): Linear atelectasis or fibrosis in the left midlung and right lung base no focal consolidation. CT head without contrast & CT cervical spine without contrast 04/28/2021: No acute intracranial abnormalities, mild cerebral atrophy.  Diffuse degenerative change in the cervical spine no acute displaced fractures.  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 04/29/2021-Pt successfully extubated to 4L O2 via nasal canula  04/30/2021 - Patient becoming more lethargic this morning, became unarousable with ABG showing hypercapnia, PCCM re-consulted.  Reintubated.  Precedex started briefly overnight 9/21 Extubated 9/23 patient has been transferred to [facility] however exhibited increased work of breathing and profound hypoxemia, PCCM assumed care again 9/24 markedly improved, tolerating chest physiotherapy  Interim History / Subjective:   Uneventful night, did ask for the chaplain around 3:00 in the morning.  Sleepy this morning but arousable.  Objective   Blood pressure 110/74, pulse 84, temperature (!) 97.5 F (36.4 C), temperature source Oral, resp. rate (!) 21, weight 79.7 kg, SpO2 100 %.    FiO2 (%):  [25 %-40 %] 25 %   Intake/Output Summary (Last 24 hours) at 05/04/2021 1033 Last data filed at 05/04/2021 1020 Gross per 24 hour  Intake 668 ml  Output 1450 ml  Net -782 ml    Filed Weights   05/02/21 0352 05/03/21 0322 05/04/21 0500  Weight: 82.4 kg 78.3 kg 79.7 kg    Examination: Gen: No respiratory distress, persistent poor cough mechanics, O2 sats with supplemental oxygen 100%, now on nasal cannula O2 HEENT: EOMI, sclera anicteric Neck: No masses; no thyromegaly Lungs: Distant breath sounds, increased air entry on the left, no wheezes, scattered rhonchi CV: Tachycardic with regular rate rhythm; no murmurs Abd: Positive bowel sounds; soft, non-tender; no palpable masses, no distension Ext:  No edema; adequate peripheral  perfusion Skin: Warm and dry; no rash Neuro: Sleepy but arousable, more interactive   Lab/imaging reviewed: Chest x-ray shows marked improvement on the atelectasis on the left base    Results for orders placed or performed during the hospital encounter of 04/28/21 (from the past 24 hour(s))  Glucose, capillary     Status: None   Collection Time: 05/03/21 11:20 AM  Result Value Ref Range   Glucose-Capillary 91 70 - 99 mg/dL  Glucose, capillary     Status: None   Collection Time: 05/03/21  4:07 PM  Result Value Ref Range   Glucose-Capillary 84 70 - 99 mg/dL  Glucose, capillary     Status: Abnormal   Collection Time: 05/03/21  8:41 PM  Result Value Ref Range   Glucose-Capillary 133 (H) 70 - 99 mg/dL  Glucose, capillary     Status: None   Collection Time: 05/04/21 12:30 AM  Result Value Ref Range   Glucose-Capillary 95 70 - 99 mg/dL  Basic metabolic panel     Status: Abnormal   Collection Time: 05/04/21  3:42 AM  Result Value Ref Range   Sodium 134 (L) 135 - 145 mmol/L   Potassium 3.6 3.5 - 5.1 mmol/L   Chloride 95 (L) 98 - 111 mmol/L   CO2 30 22 - 32 mmol/L   Glucose, Bld 78 70 - 99 mg/dL   BUN 13 8 - 23 mg/dL   Creatinine, Ser 3.76 (L) 0.61 - 1.24 mg/dL   Calcium 8.4 (L) 8.9 - 10.3 mg/dL   GFR, Estimated >28 >31 mL/min   Anion gap 9 5 - 15  Magnesium     Status: None   Collection Time: 05/04/21  3:42 AM  Result Value Ref Range   Magnesium 1.7 1.7 - 2.4 mg/dL  Phosphorus     Status: None   Collection Time: 05/04/21  3:42 AM  Result Value Ref Range   Phosphorus 4.3 2.5 - 4.6 mg/dL  CBC     Status: Abnormal   Collection Time: 05/04/21  3:42 AM  Result Value Ref Range   WBC 10.0 4.0 - 10.5 K/uL   RBC 4.27 4.22 - 5.81 MIL/uL   Hemoglobin 12.1 (L) 13.0 - 17.0 g/dL   HCT 51.7 (L) 61.6 - 07.3 %   MCV 90.4 80.0 - 100.0 fL   MCH 28.3 26.0 - 34.0 pg   MCHC 31.3 30.0 - 36.0 g/dL   RDW 71.0 62.6 - 94.8 %   Platelets 287 150 - 400 K/uL   nRBC 0.0 0.0 - 0.2 %  Glucose,  capillary     Status: None   Collection Time: 05/04/21  4:24 AM  Result Value Ref Range   Glucose-Capillary 97 70 - 99 mg/dL  Glucose, capillary     Status: Abnormal   Collection Time: 05/04/21  8:17 AM  Result Value Ref Range   Glucose-Capillary 69 (L) 70 - 99 mg/dL   ABG    Component Value Date/Time   PHART 7.43 05/03/2021 0939   PCO2ART 45 05/03/2021 0939   PO2ART 69 (L) 05/03/2021 0939   HCO3 29.9 (H) 05/03/2021 0939   O2SAT 94.1 05/03/2021 0939   Resolved Hospital Problem list     Assessment & Plan:  Acute on chronic combined hypoxic & Hypercapnic Respiratory Failure secondary to suspected aspiration in the setting of acute encephalopathy Suspected Aspiration Pneumonia Very poor cough mechanics PMHx: Advanced COPD Retained secretions/atelectasis, markedly improved after aggressive chest physiotherapy Discontinued Mucomyst due to  worsening status after dosing Add Pulmicort twice a day Chest vest physiotherapy continue twice daily Continue bronchodilators Stoped solumedrol due to encephalopathy Chest x-ray shows marked improvement today  Acute Encephalopathy multifactorial in the setting of hypoxia and possibly medication induced (psychiatric and/or antihypertensive  Possible syncopal episodes  PMHx: Schizophrenia, Seizures (ETOH withdrawal related), ETOH abuse UDS + for tricyclics (however upon Rx consultation Wellbutrin can cause + tricyclic result). 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  Palliative Care consultation pending.  Patient stable for transfer to [facility] service.  Level 3 follow-up   C. Danice Goltz, MD Advanced Bronchoscopy PCCM [facility]  05/04/2021, 10:33 AM    *This note was dictated using voice recognition software/Dragon.  Despite best efforts to proofread, errors can occur which can change the meaning.  Any change was purely unintentional.

## 2021-05-04 NOTE — Progress Notes (Signed)
Pt awake/alert; Able to follow commands when prompted;  Afebrile today;  Sinus Rhythm;  4 L HFNC;  Dysphagia II diet with thin liquids;  Patient tolerates meds crush in applesauce;  Foley catheter intact;  BM today;  Family in to visit patient today; Updates provided;   K.Mahati Vajda, RN, BSN

## 2021-05-04 NOTE — Evaluation (Signed)
Clinical/Bedside Swallow Evaluation Patient Details  Name: TALYN DESSERT MRN: [id number] Date of Birth: Feb 23, 1956  Today's Date: 05/04/2021 Time: SLP Start Time (ACUTE ONLY): 1300 SLP Stop Time (ACUTE ONLY): 1340 SLP Time Calculation (min) (ACUTE ONLY): 40 min  Past Medical History:  Past Medical History:  Diagnosis Date   Anemia    IDA   Colon polyps    COPD (chronic obstructive pulmonary disease) (HCC)    Coronary artery disease    Heart attack (HCC) 2005   Hiatal hernia    History of ETOH abuse    Hyperlipidemia    Hypertension    Pleurisy    Schizophrenia (HCC)    Schizophrenia (HCC)    Seizures (HCC)    grand mal in 2000 or 2001 recovery from alcoholism   Suicide attempt [facility])    Past Surgical History:  Past Surgical History:  Procedure Laterality Date   ABDOMINAL SURGERY     internal bleeding   CHOLECYSTECTOMY N/A 05/04/2018   Procedure: LAPAROSCOPIC CHOLECYSTECTOMY, converted to open;  Surgeon: Sung Amabile, DO;  Location: [facility] ORS;  Service: General;  Laterality: N/A;   COLONOSCOPY     COLONOSCOPY WITH PROPOFOL N/A 05/09/2016   Procedure: COLONOSCOPY WITH PROPOFOL;  Surgeon: Scot Jun, MD;  Location: [facility] ENDOSCOPY;  Service: Endoscopy;  Laterality: N/A;   CORONARY ANGIOPLASTY WITH STENT PLACEMENT     1 vessel   ESOPHAGOGASTRODUODENOSCOPY (EGD) WITH PROPOFOL N/A 05/09/2016   Procedure: ESOPHAGOGASTRODUODENOSCOPY (EGD) WITH PROPOFOL;  Surgeon: Scot Jun, MD;  [facility] END[facility]COPY;  Service: Endoscopy;  Laterality: N/A;   EYE SURGERY Right    GLAUCOMA SURGERY     LAPAROSCOPIC APPENDECTOMY N/A 07/17/2018   Procedure: APPENDECTOMY LAPAROSCOPIC;  Surgeon: Sung Amabile, DO;  Location: [facility] ORS;  Service: General;  Laterality: N/A;   LEFT HEART CATH Right 10/19/2017   Procedure: Left Heart Cath and Coronary Angiography;  Surgeon: Laurier Nancy, MD;  Location: [facility] INVASIVE CV LAB;  Service: Cardiovascular;  Laterality: Right;   NOSE SURGERY     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 male with   COPD, CAD, hypertension, hyperlipidemia, schizophrenia, schizoaffective disorder bipolar 1, generalized weakness, debility, query early Dementia, seizure, Alcohol Abuse, MI 2005, history of suicidal attempt in the past admitted on 04/28/2021 for Acute respiratory failure,  Hyponatremia, Acute respiratory failure with hypoxia, Unresponsiveness, Acute metabolic encephalopathy.  Psychiatry is following. Recent admits to the ED/hospital in recent month.  During this admit so far, pt has had 2 brief oral intubations lasting ~1 day each; w/ BiPAP.  He self-extubated this morning at the time of this eval and is on [place] O2 support of 3L.   Head CT: No acute intracranial abnormalities.  Mild cerebral atrophy.   CXR: left basilar opacities.    Assessment / Plan / Recommendation  Clinical Impression  Pt visited today for further assessment of swallowing abilities. 9/23 Pt presented with decline in respiratory status and alertness with need for NPO secondary to risk of aspiration. Today, Pt is alert sitting upright in bed following directions. Pt tolerated thin liquids, purees and soft solids with no s/s of aspiration. Oral transit delay with solids. Pt is mostly aphonic with occasional weak voicing. He used paper to write and communicate as well as mouthing words. May benefit from ENT eval if voicing does not improve with overall strength. ST to follow and evaluate as indicated  for communication and voicing. Rec Dys 2 chopped diet for ease of chewing. Rec staff assist with self feeding to avoid fatigue/overexertion. SLP Visit Diagnosis: Dysphagia, unspecified (R13.10)    Aspiration Risk    Mild to moderate   Diet Recommendation     Medication Administration: Whole meds with puree Compensations: Slow rate;Small sips/bites;Other (Comment) (allow for periods of rest)    Other  Recommendations Oral Care Recommendations:  Oral care BID;Patient independent with oral care    Recommendations for follow up therapy are one component of a multi-disciplinary discharge planning process, led by the attending physician.  Recommendations may be updated based on patient status, additional functional criteria and insurance authorization.  Follow up Recommendations    ST to follow    Frequency and Duration   2-3 x week         Prognosis: Fair        Swallow Study   General HPI: Pt is a 65 y.o. male with   COPD, CAD, hypertension, hyperlipidemia, schizophrenia, schizoaffective disorder bipolar 1, generalized weakness, debility, query early Dementia, seizure, Alcohol Abuse, MI 2005, history of suicidal attempt in the past admitted on 04/28/2021 for Acute respiratory failure,  Hyponatremia, Acute respiratory failure with hypoxia, Unresponsiveness, Acute metabolic encephalopathy.  Psychiatry is following. Recent admits to the ED/hospital in recent month.  During this admit so far, pt has had 2 brief oral intubations lasting ~1 day each; w/ BiPAP.  He self-extubated this morning at the time of this eval and is on [place] O2 support of 3L.   Head CT: No acute intracranial abnormalities.  Mild cerebral atrophy.   CXR: left basilar opacities. Temperature Spikes Noted: No Oral Cavity - Dentition: Adequate natural dentition;Missing dentition    Oral/Motor/Sensory Function     Ice Chips     Thin Liquid: WFL      Nectar Thick: DNT     Honey Thick: DNT     Puree: WFL     Solid: Mild to mod oral transit delay            Eather Colas 05/04/2021,1:51 PM

## 2021-05-05 ENCOUNTER — Encounter: Payer: Self-pay | Admitting: *Deleted

## 2021-05-05 DIAGNOSIS — F250 Schizoaffective disorder, bipolar type: Secondary | ICD-10-CM | POA: Diagnosis not present

## 2021-05-05 DIAGNOSIS — I951 Orthostatic hypotension: Secondary | ICD-10-CM | POA: Diagnosis not present

## 2021-05-05 DIAGNOSIS — J9601 Acute respiratory failure with hypoxia: Secondary | ICD-10-CM | POA: Diagnosis not present

## 2021-05-05 DIAGNOSIS — J9602 Acute respiratory failure with hypercapnia: Secondary | ICD-10-CM | POA: Diagnosis not present

## 2021-05-05 LAB — BASIC METABOLIC PANEL
Anion gap: 8 (ref 5–15)
BUN: 10 mg/dL (ref 8–23)
CO2: 31 mmol/L (ref 22–32)
Calcium: 8.5 mg/dL — ABNORMAL LOW (ref 8.9–10.3)
Chloride: 94 mmol/L — ABNORMAL LOW (ref 98–111)
Creatinine, Ser: 0.48 mg/dL — ABNORMAL LOW (ref 0.61–1.24)
GFR, Estimated: >60 mL/min (ref >60)
Glucose, Bld: 96 mg/dL (ref 70–99)
Potassium: 3.7 mmol/L (ref 3.5–5.1)
Sodium: 133 mmol/L — ABNORMAL LOW (ref 135–145)

## 2021-05-05 LAB — GLUCOSE, CAPILLARY
Glucose-Capillary: 101 mg/dL — ABNORMAL HIGH (ref 70–99)
Glucose-Capillary: 102 mg/dL — ABNORMAL HIGH (ref 70–99)
Glucose-Capillary: 115 mg/dL — ABNORMAL HIGH (ref 70–99)
Glucose-Capillary: 137 mg/dL — ABNORMAL HIGH (ref 70–99)
Glucose-Capillary: 97 mg/dL (ref 70–99)

## 2021-05-05 LAB — PHOSPHORUS: Phosphorus: 2.9 mg/dL (ref 2.5–4.6)

## 2021-05-05 LAB — MAGNESIUM: Magnesium: 2.1 mg/dL (ref 1.7–2.4)

## 2021-05-05 MED ORDER — CLOZAPINE 25 MG PO TABS
50.0000 mg | ORAL_TABLET | Freq: Every day | ORAL | Status: DC
  Administered 2021-05-05 – 2021-05-08 (×4): 50 mg via ORAL
  Filled 2021-05-05 (×6): qty 2

## 2021-05-05 MED ORDER — MELATONIN 5 MG PO TABS
5.0000 mg | ORAL_TABLET | Freq: Every day | ORAL | Status: DC
  Administered 2021-05-05 – 2021-05-16 (×12): 5 mg via ORAL
  Filled 2021-05-05 (×12): qty 1

## 2021-05-05 NOTE — NC FL2 (Signed)
[place] MEDICAID FL2 LEVEL OF CARE SCREENING TOOL     IDENTIFICATION  Patient Name: Brandon [name] Birthdate: 06-20-1956 Sex: male Admission Date (Current Location): 04/28/2021  [facility] [place] Number:  [name] [id number] [facility] Facility and Address:  [facility], [address] [place], [place] [address]      Provider Number: [id number]  Attending Physician Name and Address:  [name], MD  Relative Name and Phone Number:  [name], Baze (Brother)   [phone] [facility] Phone)    Current Level of Care: Hospital Recommended Level of Care: Skilled Nursing Facility Prior Approval Number:    Date Approved/Denied:   PASRR Number: [id number]  Discharge Plan:      Current Diagnoses: Patient Active Problem List   Diagnosis Date Noted   Endotracheally intubated 04/29/2021   On mechanically assisted ventilation (HCC) 04/29/2021   Acute metabolic encephalopathy 04/28/2021   Hypotension 04/28/2021   Acute respiratory failure (HCC) 04/28/2021   Shortness of breath 04/12/2021   Acute respiratory failure with hypoxia and hypercapnia (HCC)    Aspiration pneumonia of both lungs due to gastric secretions (HCC)    Lobar pneumonia, unspecified organism (HCC)    Vitamin D deficiency    Epigastric abdominal pain    Recurrent falls    Hyponatremia 03/22/2021   BPH (benign prostatic hyperplasia) 03/22/2021   Weakness    Schizophrenia (HCC)    Acute hyponatremia 11/27/2020   Appendicitis with perforation 07/17/2018   Acute cholecystitis 04/10/2018   Chronic cholecystitis 04/08/2018   Atherosclerosis of native arteries of extremity with intermittent claudication (HCC) 11/22/2017   Essential hypertension 11/22/2017   Hyperlipidemia 11/22/2017   COPD (chronic obstructive pulmonary disease) (HCC) 11/22/2017   Coronary artery disease 10/15/2017   Other toe(s) amputation status 11/17/2014   Schizoaffective disorder, bipolar type (HCC) 08/23/2008     Orientation RESPIRATION BLADDER Height & Weight     Self, Time, Place  Normal Continent Weight: 166 lb 14.2 oz (75.7 kg) Height:  6' (182.9 cm)  BEHAVIORAL SYMPTOMS/MOOD NEUROLOGICAL BOWEL NUTRITION STATUS      Continent Diet  AMBULATORY STATUS COMMUNICATION OF NEEDS Skin   Limited Assist Verbally Normal                       Personal Care Assistance Level of Assistance  Bathing, Feeding, Dressing, Total care Bathing Assistance: Limited assistance Feeding assistance: Limited assistance   Total Care Assistance: Limited assistance   Functional Limitations Info  Sight, Hearing, Speech Sight Info: Adequate   Speech Info: Adequate    SPECIAL CARE FACTORS FREQUENCY        PT Frequency: 5X per week OT Frequency: 5X per week            Contractures Contractures Info: Not present    Additional Factors Info                  Current Medications (05/05/2021):  This is the current hospital active medication list Current Facility-Administered Medications  Medication Dose Route Frequency Provider Last Rate Last Admin   0.9 %  sodium chloride infusion   Intravenous PRN Rust-Chester, Cecelia Byars, NP   Stopped at 05/04/21 1557   arformoterol (BROVANA) nebulizer solution 15 mcg  15 mcg Nebulization BID Martina Sinner, MD   15 mcg at 05/05/21 0744   atorvastatin (LIPITOR) tablet 20 mg  20 mg Oral QHS Mannam, Praveen, MD   20 mg at 05/04/21 2217   bisacodyl (DULCOLAX) suppository 10 mg  10 mg Rectal Daily PRN Rust-Chester, Micheline Rough L, NP   10 mg at 05/03/21 2301   budesonide (PULMICORT) nebulizer solution 0.5 mg  0.5 mg Nebulization BID Salena Saner, MD   0.5 mg at 05/05/21 0744   buPROPion ER [facility] SR) 12 hr tablet 100 mg  100 mg Oral Daily Lianne Cure, NP   100 mg at 05/05/21 6295   Chlorhexidine Gluconate Cloth 2 % PADS 6 each  6 each Topical Daily Martina Sinner, MD   6 each at 05/05/21 0444   cholecalciferol (VITAMIN D3) tablet 1,000 Units  1,000  Units Oral Daily Mannam, Praveen, MD   1,000 Units at 05/05/21 2841   cloZAPine (CLOZARIL) tablet 50 mg  50 mg Oral QHS Rust-Chester, Britton L, NP       dextrose 5 % in lactated ringers infusion   Intravenous Continuous Salena Saner, MD 75 mL/hr at 05/05/21 0300 Infusion Verify at 05/05/21 0300   docusate sodium (COLACE) capsule 100 mg  100 mg Oral BID PRN Rust-Chester, Micheline Rough L, NP   100 mg at 05/01/21 2102   dorzolamide-timolol (COSOPT) 22.3-6.8 MG/ML ophthalmic solution 1 drop  1 drop Both Eyes BID Rust-Chester, Britton L, NP   1 drop at 05/05/21 0923   enoxaparin (LOVENOX) injection 40 mg  40 mg Subcutaneous Q24H Rust-Chester, Britton L, NP   40 mg at 05/04/21 2218   ezetimibe (ZETIA) tablet 10 mg  10 mg Oral QHS Mannam, Praveen, MD   10 mg at 05/04/21 2217   feeding supplement (ENSURE ENLIVE / ENSURE PLUS) liquid 237 mL  237 mL Oral TID BM Mannam, Praveen, MD   237 mL at 05/05/21 0923   hydrALAZINE (APRESOLINE) injection 10 mg  10 mg Intravenous Q6H PRN Martina Sinner, MD   10 mg at 05/01/21 2102   hydrOXYzine (ATARAX/VISTARIL) tablet 25 mg  25 mg Oral TID PRN Lianne Cure, NP   25 mg at 05/05/21 3244   irbesartan (AVAPRO) tablet 150 mg  150 mg Oral Daily Karl Ito, MD   150 mg at 05/05/21 0102   isosorbide mononitrate (IMDUR) 24 hr tablet 30 mg  30 mg Oral Q12H Karl Ito, MD   30 mg at 05/05/21 0921   latanoprost (XALATAN) 0.005 % ophthalmic solution 1 drop  1 drop Both Eyes QHS Graves, Dana E, NP   1 drop at 05/04/21 2217   levalbuterol (XOPENEX) nebulizer solution 1.25 mg  1.25 mg Nebulization Q6H PRN Salena Saner, MD   1.25 mg at 05/03/21 0908   melatonin tablet 5 mg  5 mg Oral QHS Rust-Chester, Cecelia Byars, NP       multivitamin with minerals tablet 1 tablet  1 tablet Oral Daily Mannam, Praveen, MD   1 tablet at 05/05/21 0921   nitroGLYCERIN (NITROSTAT) SL tablet 0.4 mg  0.4 mg Sublingual Q5 min PRN Lianne Cure, NP       pantoprazole (PROTONIX) EC tablet  40 mg  40 mg Oral Daily Mannam, Praveen, MD   40 mg at 05/05/21 0921   polyethylene glycol (MIRALAX / GLYCOLAX) packet 17 g  17 g Oral Daily PRN Rust-Chester, Cecelia Byars, NP       revefenacin (YUPELRI) nebulizer solution 175 mcg  175 mcg Nebulization Daily Martina Sinner, MD   175 mcg at 05/05/21 0744   senna (SENOKOT) tablet 8.6-17.2 mg  1-2 tablet Oral Daily PRN Lianne Cure, NP       tamsulosin [facility])  capsule 0.4 mg  0.4 mg Oral Daily Lianne Cure, NP   0.4 mg at 05/05/21 4097     Discharge Medications: Please see discharge summary for a list of discharge medications.  Relevant Imaging Results:  Relevant Lab Results:   Additional Information [id number]  Joseph Art, LCSWA

## 2021-05-05 NOTE — Progress Notes (Addendum)
PROGRESS NOTE    Brandon [name]  OFB:[id number] DOB: Jan 04, 1956 DOA: 04/28/2021 PCP: Sherron Monday, MD   Assessment & Plan:   Principal Problem:   Schizoaffective disorder, bipolar type (HCC) Active Problems:   Coronary artery disease   Essential hypertension   COPD (chronic obstructive pulmonary disease) (HCC)   Acute hyponatremia   Schizophrenia (HCC)   BPH (benign prostatic hyperplasia)   Acute respiratory failure with hypoxia and hypercapnia (HCC)   Acute metabolic encephalopathy   Endotracheally intubated   On mechanically assisted ventilation (HCC)   Acute on chronic hypoxic & hypercapnic respiratory failure: likely secondary to aspiration pneumonia & hx of advanced COPD. Continue on bronchodilators, supplemental oxygen and encourage incentive spirometry   Acute encephalopathy: w/ hx of schizophrenia vs schizoaffective disorder, ethanol related seizures & ethanol abuse. Likely secondary to above.  Restarted clozaril as per psych. Continue on wellbutrin. Re-orient prn   Schizophrenia vs schizoaffective disorder: continue on home dose of clozapine, wellbutrin   Orthostatic hypotension: continue on IVFs  Acute on chronic hyponatremia: likely secondary to dehydration. Labile. Will continue to monitor   Likely BPH: continue on tamsulosin   DVT prophylaxis: lovenox Code Status: DNR Family Communication:  Disposition Plan:  unclear, depends on PT/OT resc   Level of care: Stepdown  Status is: Inpatient  Remains inpatient appropriate because:IV treatments appropriate due to intensity of illness or inability to take PO and Inpatient level of care appropriate due to severity of illness  Dispo: The patient is from:  home facility               Anticipated d/c is to:  unclear              Patient currently is not medically stable to d/c.   Difficult to place patient : unclear    Consultants:  ICU   Procedures:   Antimicrobials:    Subjective: Pt denies any  pain or shortness of breath. Pt does not verbalize any complaints   Objective: Vitals:   05/05/21 0443 05/05/21 0500 05/05/21 0529 05/05/21 0600  BP:   (!) 152/92 (!) 157/88  Pulse:  87 77 75  Resp:  (!) 24 19 (!) 24  Temp:      TempSrc:      SpO2:  100% 99% 100%  Weight: 75.7 kg     Height:        Intake/Output Summary (Last 24 hours) at 05/05/2021 0801 Last data filed at 05/05/2021 0400 Gross per 24 hour  Intake 1725.75 ml  Output 1145 ml  Net 580.75 ml   Filed Weights   05/04/21 0500 05/05/21 0000 05/05/21 0443  Weight: 79.7 kg 78.3 kg 75.7 kg    Examination:  General exam: Appears calm and comfortable  Respiratory system: diminished breath sounds b/l  Cardiovascular system: S1 & S2 +. No rubs, gallops or clicks.  Gastrointestinal system: Abdomen is nondistended, soft and nontender. Normal bowel sounds heard. Central nervous system: Alert and awake. Moves all extremities  Psychiatry: Judgement and insight appear abnormal. Flat mood and affect     Data Reviewed: I have personally reviewed following labs and imaging studies  CBC: Recent Labs  Lab 04/28/21 1905 04/29/21 0536 04/30/21 0148 05/02/21 0519 05/04/21 0342  WBC 9.6 8.3 8.2 9.8 10.0  NEUTROABS 7.6  --  6.1  --   --   HGB 10.9* 13.2 11.7* 12.3* 12.1*  HCT 32.5* 40.6 35.1* 36.8* 38.6*  MCV 87.8 86.8 87.5 88.5 90.4  PLT 266  303 302 345 287   Basic Metabolic Panel: Recent Labs  Lab 04/29/21 0536 04/29/21 2127 04/30/21 0148 04/30/21 0541 05/01/21 0504 05/02/21 0519 05/04/21 0342 05/05/21 0625  NA 127*   < > 130* 130* 130* 131* 134* 133*  K 3.9  --  3.7  --  4.0 3.8 3.6 3.7  CL 92*  --  97*  --  94* 93* 95* 94*  CO2 28  --  28  --  29 30 30 31   GLUCOSE 115*  --  95  --  110* 89 78 96  BUN 7*  --  7*  --  11 12 13 10   CREATININE 0.67  --  0.63  --  0.53* 0.58* 0.55* 0.48*  CALCIUM 8.9  --  8.5*  --  8.8* 9.0 8.4* 8.5*  MG 1.9  --  1.7  --  1.8 1.8 1.7 2.1  PHOS 3.4  --  3.1  --   --  3.5 4.3  2.9   < > = values in this interval not displayed.   GFR: Estimated Creatinine Clearance: 98.6 mL/min (A) (by C-G formula based on SCr of 0.48 mg/dL (L)). Liver Function Tests: Recent Labs  Lab 04/28/21 1905 05/01/21 0504  AST 16 19  ALT 13 16  ALKPHOS 78 81  BILITOT 0.7 0.8  PROT 5.3* 5.2*  ALBUMIN 2.9* 2.7*   No results for input(s): LIPASE, AMYLASE in the last 168 hours. No results for input(s): AMMONIA in the last 168 hours. Coagulation Profile: Recent Labs  Lab 04/28/21 1905  INR 0.9   Cardiac Enzymes: No results for input(s): CKTOTAL, CKMB, CKMBINDEX, TROPONINI in the last 168 hours. BNP (last 3 results) No results for input(s): PROBNP in the last 8760 hours. HbA1C: No results for input(s): HGBA1C in the last 72 hours. CBG: Recent Labs  Lab 05/04/21 0424 05/04/21 0817 05/04/21 1147 05/04/21 1922 05/04/21 2333  GLUCAP 97 69* 83 97 103*   Lipid Profile: No results for input(s): CHOL, HDL, LDLCALC, TRIG, CHOLHDL, LDLDIRECT in the last 72 hours. Thyroid Function Tests: No results for input(s): TSH, T4TOTAL, FREET4, T3FREE, THYROIDAB in the last 72 hours. Anemia Panel: No results for input(s): VITAMINB12, FOLATE, FERRITIN, TIBC, IRON, RETICCTPCT in the last 72 hours. Sepsis Labs: Recent Labs  Lab 04/28/21 1905 04/29/21 0217 04/29/21 0536 04/30/21 0148  PROCALCITON  --  <0.10 <0.10 <0.10  LATICACIDVEN 1.0  --   --   --     Recent Results (from the past 240 hour(s))  Resp Panel by RT-PCR (Flu A&B, Covid) Nasopharyngeal Swab     Status: None   Collection Time: 04/28/21  7:05 PM   Specimen: Nasopharyngeal Swab; Nasopharyngeal(NP) swabs in vial transport medium  Result Value Ref Range Status   SARS Coronavirus 2 by RT PCR NEGATIVE NEGATIVE Final    Comment: (NOTE) SARS-CoV-2 target nucleic acids are NOT DETECTED.  The SARS-CoV-2 RNA is generally detectable in upper respiratory specimens during the acute phase of infection. The lowest concentration of  SARS-CoV-2 viral copies this assay can detect is 138 copies/mL. A negative result does not preclude SARS-Cov-2 infection and should not be used as the sole basis for treatment or other patient management decisions. A negative result may occur with  improper specimen collection/handling, submission of specimen other than nasopharyngeal swab, presence of viral mutation(s) within the areas targeted by this assay, and inadequate number of viral copies(<138 copies/mL). A negative result must be combined with clinical observations, patient history, and epidemiological information.  The expected result is Negative.  Fact Sheet for Patients:  BloggerCourse.com  Fact Sheet for Healthcare Providers:  SeriousBroker.it  This test is no t yet approved or cleared by the [place] FDA and  has been authorized for detection and/or diagnosis of SARS-CoV-2 by FDA under an Emergency Use Authorization (EUA). This EUA will remain  in effect (meaning this test can be used) for the duration of the COVID-19 declaration under Section 564(b)(1) of the Act, 21 U.S.C.section 360bbb-3(b)(1), unless the authorization is terminated  or revoked sooner.       Influenza A by PCR NEGATIVE NEGATIVE Final   Influenza B by PCR NEGATIVE NEGATIVE Final    Comment: (NOTE) The Xpert Xpress SARS-CoV-2/FLU/RSV plus assay is intended as an aid in the diagnosis of influenza from Nasopharyngeal swab specimens and should not be used as a sole basis for treatment. Nasal washings and aspirates are unacceptable for Xpert Xpress SARS-CoV-2/FLU/RSV testing.  Fact Sheet for Patients: BloggerCourse.com  Fact Sheet for Healthcare Providers: SeriousBroker.it  This test is not yet approved or cleared by the [place] FDA and has been authorized for detection and/or diagnosis of SARS-CoV-2 by FDA under an Emergency Use  Authorization (EUA). This EUA will remain in effect (meaning this test can be used) for the duration of the COVID-19 declaration under Section 564(b)(1) of the Act, 21 U.S.C. section 360bbb-3(b)(1), unless the authorization is terminated or revoked.  Performed at [facility], [address], [place], [place] [address]   Blood Culture (routine x 2)     Status: None   Collection Time: 04/28/21  7:05 PM   Specimen: BLOOD  Result Value Ref Range Status   Specimen Description BLOOD  RT FOOT  Final   Special Requests BOTTLES DRAWN AEROBIC ONLY  Final   Culture   Final    NO GROWTH 5 DAYS Performed at [facility], [address] [place], [place] [address]    Report Status 05/03/2021 FINAL  Final  Blood Culture (routine x 2)     Status: None   Collection Time: 04/28/21  7:05 PM   Specimen: BLOOD  Result Value Ref Range Status   Specimen Description BLOOD  LFOA  Final   Special Requests   Final    BOTTLES DRAWN AEROBIC AND ANAEROBIC Blood Culture adequate volume   Culture   Final    NO GROWTH 5 DAYS Performed at [facility], [address] [place], [place] [address]    Report Status 05/03/2021 FINAL  Final  Urine Culture     Status: None   Collection Time: 04/28/21 10:17 PM   Specimen: Urine, Random  Result Value Ref Range Status   Specimen Description   Final    URINE, RANDOM Performed at [facility], [address], [place], [place] [address]    Special Requests   Final    NONE Performed at [facility], [address] [place], [place] [address]    Culture   Final    NO GROWTH Performed at [facility], [address], [place], [place] [address]    Report Status 04/29/2021 FINAL  Final  MRSA Next Gen by PCR, Nasal     Status: None   Collection Time: 04/29/21 12:55 AM   Specimen: Nasal Mucosa; Nasal Swab  Result Value Ref Range Status   MRSA by PCR Next Gen NOT DETECTED NOT DETECTED Final    Comment:  (NOTE) The GeneXpert MRSA Assay (FDA approved for  NASAL specimens only), is one component of a comprehensive MRSA colonization surveillance program. It is not intended to diagnose MRSA infection nor to guide or monitor treatment for MRSA infections. Test performance is not FDA approved in patients less than 66 years old. Performed at [facility], [address], [place], [place] [address]          Radiology Studies: DG Chest [place] 1 View  Result Date: 05/04/2021 CLINICAL DATA:  Acute respiratory failure, hypoxia EXAM: PORTABLE CHEST 1 VIEW COMPARISON:  05/03/2021 FINDINGS: Left lung collapse and consolidation has improved with partial re-expansion of the left lung and mild residual left basilar atelectasis or infiltrate. Lung volumes are small, but appears symmetric. No pneumothorax or pleural effusion. Cardiac size within normal limits. Pulmonary vascularity is normal. IMPRESSION: Marked interval improvement in left lung collapse and consolidation with mild residual left basilar atelectasis or infiltrate. Pulmonary hypoinflation. Electronically Signed   By: Helyn Numbers M.D.   On: 05/04/2021 04:11   DG Chest Port 1 View  Result Date: 05/03/2021 CLINICAL DATA:  Unresponsive, history schizophrenia, seizures, ethanol abuse, hypertension, COPD, coronary artery disease, MI EXAM: PORTABLE CHEST 1 VIEW COMPARISON:  Portable exam 0856 hours compared to 05/02/2021 FINDINGS: Volume loss in the LEFT hemithorax with mediastinal shift to the LEFT. Normal heart size and mediastinal contours. Atherosclerotic calcification aorta. Increased atelectasis LEFT lung with new infiltrate in LEFT upper lobe. Small associated LEFT pleural effusion. RIGHT lung clear. No acute osseous findings. IMPRESSION: Increased atelectasis and volume loss LEFT lung with new LEFT upper lobe infiltrate and small associated LEFT pleural effusion. Electronically Signed   By: Ulyses Southward M.D.   On: 05/03/2021 11:58         Scheduled Meds:  arformoterol  15 mcg Nebulization BID   atorvastatin  20 mg Oral QHS   budesonide (PULMICORT) nebulizer solution  0.5 mg Nebulization BID   buPROPion ER  100 mg Oral Daily   Chlorhexidine Gluconate Cloth  6 each Topical Daily   cholecalciferol  1,000 Units Oral Daily   cloZAPine  50 mg Oral QHS   dorzolamide-timolol  1 drop Both Eyes BID   enoxaparin (LOVENOX) injection  40 mg Subcutaneous Q24H   ezetimibe  10 mg Oral QHS   feeding supplement  237 mL Oral TID BM   irbesartan  150 mg Oral Daily   isosorbide mononitrate  30 mg Oral Q12H   latanoprost  1 drop Both Eyes QHS   melatonin  5 mg Oral QHS   multivitamin with minerals  1 tablet Oral Daily   pantoprazole  40 mg Oral Daily   revefenacin  175 mcg Nebulization Daily   tamsulosin  0.4 mg Oral Daily   Continuous Infusions:  sodium chloride Stopped (05/04/21 1557)   dextrose 5% lactated ringers 75 mL/hr at 05/05/21 0300     LOS: 7 days    Time spent: 30 mins     [name], MD [facility] Pager [phone]  If 7PM-7AM, please contact night-coverage  05/05/2021, 8:01 AM

## 2021-05-05 NOTE — TOC Progression Note (Addendum)
Transition of Care [facility]) - Progression Note    Patient Details  Name: Brandon [name] MRN: [id number] Date of Birth: 11/11/1955  [facility] CM/SW Contact  Marina Goodell Phone Number: [phone] 05/05/2021, 3:47 PM  Clinical Narrative:     CSW spoke with patient's brother Ebenezer, Mccaskey (Brother)  [phone] [facility] Phone), to update him on PT/OT recommendations and SNF search.  FL2 sent for signature PASRR# [id number].  Preference is for [facility].   Expected Discharge Plan: Home w Hospice Care Barriers to Discharge: No Barriers Identified  Expected Discharge Plan and Services Expected Discharge Plan: Home w Hospice Care In-house Referral: Clinical Social Work     Living arrangements for the past 2 months: Apartment                                       Social Determinants of Health (SDOH) Interventions    Readmission Risk Interventions Readmission Risk Prevention Plan 04/17/2021 04/09/2021 03/24/2021  Transportation Screening Complete Complete Complete  PCP or Specialist Appt within 5-7 Days - - Complete  Home Care Screening - - Complete  Medication Review (RN CM) - - Complete  Medication Review [employer]) - Complete -  PCP or Specialist appointment within 3-5 days of discharge Complete Complete -  HRI or Home Care Consult Complete Complete -  SW Recovery Care/Counseling Consult Complete Complete -  Palliative Care Screening Not Applicable Not Applicable -  Skilled Nursing Facility Complete Complete -  Some recent data might be hidden

## 2021-05-06 DIAGNOSIS — R4182 Altered mental status, unspecified: Secondary | ICD-10-CM

## 2021-05-06 DIAGNOSIS — E871 Hypo-osmolality and hyponatremia: Secondary | ICD-10-CM | POA: Diagnosis not present

## 2021-05-06 DIAGNOSIS — J9601 Acute respiratory failure with hypoxia: Secondary | ICD-10-CM | POA: Diagnosis not present

## 2021-05-06 DIAGNOSIS — I10 Essential (primary) hypertension: Secondary | ICD-10-CM

## 2021-05-06 DIAGNOSIS — J9602 Acute respiratory failure with hypercapnia: Secondary | ICD-10-CM | POA: Diagnosis not present

## 2021-05-06 DIAGNOSIS — J449 Chronic obstructive pulmonary disease, unspecified: Secondary | ICD-10-CM | POA: Diagnosis not present

## 2021-05-06 DIAGNOSIS — F250 Schizoaffective disorder, bipolar type: Secondary | ICD-10-CM | POA: Diagnosis not present

## 2021-05-06 DIAGNOSIS — Z66 Do not resuscitate: Secondary | ICD-10-CM

## 2021-05-06 DIAGNOSIS — Z515 Encounter for palliative care: Secondary | ICD-10-CM

## 2021-05-06 LAB — MAGNESIUM: Magnesium: 1.6 mg/dL — ABNORMAL LOW (ref 1.7–2.4)

## 2021-05-06 LAB — BASIC METABOLIC PANEL
Anion gap: 6 (ref 5–15)
BUN: 7 mg/dL — ABNORMAL LOW (ref 8–23)
CO2: 30 mmol/L (ref 22–32)
Calcium: 8.5 mg/dL — ABNORMAL LOW (ref 8.9–10.3)
Chloride: 96 mmol/L — ABNORMAL LOW (ref 98–111)
Creatinine, Ser: 0.41 mg/dL — ABNORMAL LOW (ref 0.61–1.24)
GFR, Estimated: >60 mL/min (ref >60)
Glucose, Bld: 115 mg/dL — ABNORMAL HIGH (ref 70–99)
Potassium: 3.7 mmol/L (ref 3.5–5.1)
Sodium: 132 mmol/L — ABNORMAL LOW (ref 135–145)

## 2021-05-06 LAB — PHOSPHORUS: Phosphorus: 3.7 mg/dL (ref 2.5–4.6)

## 2021-05-06 LAB — GLUCOSE, CAPILLARY
Glucose-Capillary: 112 mg/dL — ABNORMAL HIGH (ref 70–99)
Glucose-Capillary: 115 mg/dL — ABNORMAL HIGH (ref 70–99)
Glucose-Capillary: 97 mg/dL (ref 70–99)
Glucose-Capillary: 105 mg/dL — ABNORMAL HIGH (ref 70–99)
Glucose-Capillary: 96 mg/dL (ref 70–99)

## 2021-05-06 MED ORDER — MAGNESIUM SULFATE 2 GM/50ML IV SOLN
2.0000 g | Freq: Once | INTRAVENOUS | Status: AC
  Administered 2021-05-06: 2 g via INTRAVENOUS
  Filled 2021-05-06: qty 50

## 2021-05-06 NOTE — Progress Notes (Addendum)
PROGRESS NOTE    Brandon [name]  YWV:[id number] DOB: 14-Jan-1956 DOA: 04/28/2021 PCP: Sherron Monday, MD   Assessment & Plan:   Principal Problem:   Schizoaffective disorder, bipolar type (HCC) Active Problems:   Coronary artery disease   Essential hypertension   COPD (chronic obstructive pulmonary disease) (HCC)   Acute hyponatremia   Schizophrenia (HCC)   BPH (benign prostatic hyperplasia)   Acute respiratory failure with hypoxia and hypercapnia (HCC)   Acute metabolic encephalopathy   Endotracheally intubated   On mechanically assisted ventilation (HCC)   Acute on chronic hypoxic & hypercapnic respiratory failure: likely secondary to aspiration pneumonia & hx of advanced COPD. Continue on bronchodilators, chest PT, and encourage incentive spirometry   Acute encephalopathy: w/ hx of schizophrenia vs schizoaffective disorder, ethanol related seizures & ethanol abuse. Likely secondary to above.  Restarted clozaril as per psych. Continue on wellbutrin. Re-orient prn   Schizophrenia vs schizoaffective disorder: continue on home dose of wellbutrin, clozapine   Orthostatic hypotension: resolved   Acute on chronic hyponatremia: labile. Etiology unclear, possible SIADH as dehydration has resolved. Labile. Will continue to monitor  Likely BPH: continue on tamsulosin   DVT prophylaxis: lovenox Code Status: DNR Family Communication: dicussed pt's care w/ pt's brother, Casimiro Needle and answered his questions  Disposition Plan:  recs SNF  Level of care: Stepdown  Status is: Inpatient  Remains inpatient appropriate because:IV treatments appropriate due to intensity of illness or inability to take PO and Inpatient level of care appropriate due to severity of illness  Dispo: The patient is from:  home facility               Anticipated d/c is to: home facility               Patient currently is not medically stable to d/c.   Difficult to place patient : unclear    Consultants:   ICU   Procedures:   Antimicrobials:    Subjective: Pt is not able to verbalize his complaints  Objective: Vitals:   05/06/21 0400 05/06/21 0500 05/06/21 0600 05/06/21 0700  BP: (!) 132/95 129/87 127/83 131/80  Pulse: 86 83 82 85  Resp: (!) 29 (!) 27 (!) 28 (!) 28  Temp: 98.2 F (36.8 C)     TempSrc: Oral     SpO2: 100% 100% 100% 100%  Weight:      Height:        Intake/Output Summary (Last 24 hours) at 05/06/2021 0808 Last data filed at 05/06/2021 0659 Gross per 24 hour  Intake 2023.35 ml  Output 1950 ml  Net 73.35 ml   Filed Weights   05/04/21 0500 05/05/21 0000 05/05/21 0443  Weight: 79.7 kg 78.3 kg 75.7 kg    Examination:  General exam: Appears comfortable  Respiratory system: decreased breath sounds b/l  Cardiovascular system: S1/S2+. No rubs or clicks   Gastrointestinal system: Abd is soft, NT, ND & hypoactive bowel sounds Central nervous system: Alert and awake.  Psychiatry: Judgement and insight appears poor. Flat mood and affect     Data Reviewed: I have personally reviewed following labs and imaging studies  CBC: Recent Labs  Lab 04/30/21 0148 05/02/21 0519 05/04/21 0342  WBC 8.2 9.8 10.0  NEUTROABS 6.1  --   --   HGB 11.7* 12.3* 12.1*  HCT 35.1* 36.8* 38.6*  MCV 87.5 88.5 90.4  PLT 302 345 287   Basic Metabolic Panel: Recent Labs  Lab 04/30/21 0148 04/30/21 0541 05/01/21 0504 05/02/21  3382 05/04/21 0342 05/05/21 0625 05/06/21 0512  NA 130*   < > 130* 131* 134* 133* 132*  K 3.7  --  4.0 3.8 3.6 3.7 3.7  CL 97*  --  94* 93* 95* 94* 96*  CO2 28  --  29 30 30 31 30   GLUCOSE 95  --  110* 89 78 96 115*  BUN 7*  --  11 12 13 10  7*  CREATININE 0.63  --  0.53* 0.58* 0.55* 0.48* 0.41*  CALCIUM 8.5*  --  8.8* 9.0 8.4* 8.5* 8.5*  MG 1.7  --  1.8 1.8 1.7 2.1 1.6*  PHOS 3.1  --   --  3.5 4.3 2.9 3.7   < > = values in this interval not displayed.   GFR: Estimated Creatinine Clearance: 98.6 mL/min (A) (by C-G formula based on SCr of 0.41  mg/dL (L)). Liver Function Tests: Recent Labs  Lab 05/01/21 0504  AST 19  ALT 16  ALKPHOS 81  BILITOT 0.8  PROT 5.2*  ALBUMIN 2.7*   No results for input(s): LIPASE, AMYLASE in the last 168 hours. No results for input(s): AMMONIA in the last 168 hours. Coagulation Profile: No results for input(s): INR, PROTIME in the last 168 hours.  Cardiac Enzymes: No results for input(s): CKTOTAL, CKMB, CKMBINDEX, TROPONINI in the last 168 hours. BNP (last 3 results) No results for input(s): PROBNP in the last 8760 hours. HbA1C: No results for input(s): HGBA1C in the last 72 hours. CBG: Recent Labs  Lab 05/05/21 1512 05/05/21 2004 05/05/21 2356 05/06/21 0434 05/06/21 0724  GLUCAP 97 102* 115* 112* 115*   Lipid Profile: No results for input(s): CHOL, HDL, LDLCALC, TRIG, CHOLHDL, LDLDIRECT in the last 72 hours. Thyroid Function Tests: No results for input(s): TSH, T4TOTAL, FREET4, T3FREE, THYROIDAB in the last 72 hours. Anemia Panel: No results for input(s): VITAMINB12, FOLATE, FERRITIN, TIBC, IRON, RETICCTPCT in the last 72 hours. Sepsis Labs: Recent Labs  Lab 04/30/21 0148  PROCALCITON <0.10    Recent Results (from the past 240 hour(s))  Resp Panel by RT-PCR (Flu A&B, Covid) Nasopharyngeal Swab     Status: None   Collection Time: 04/28/21  7:05 PM   Specimen: Nasopharyngeal Swab; Nasopharyngeal(NP) swabs in vial transport medium  Result Value Ref Range Status   SARS Coronavirus 2 by RT PCR NEGATIVE NEGATIVE Final    Comment: (NOTE) SARS-CoV-2 target nucleic acids are NOT DETECTED.  The SARS-CoV-2 RNA is generally detectable in upper respiratory specimens during the acute phase of infection. The lowest concentration of SARS-CoV-2 viral copies this assay can detect is 138 copies/mL. A negative result does not preclude SARS-Cov-2 infection and should not be used as the sole basis for treatment or other patient management decisions. A negative result may occur with   improper specimen collection/handling, submission of specimen other than nasopharyngeal swab, presence of viral mutation(s) within the areas targeted by this assay, and inadequate number of viral copies(<138 copies/mL). A negative result must be combined with clinical observations, patient history, and epidemiological information. The expected result is Negative.  Fact Sheet for Patients:  05/02/21  Fact Sheet for Healthcare Providers:  04/30/21  This test is no t yet approved or cleared by the [employer] FDA and  has been authorized for detection and/or diagnosis of SARS-CoV-2 by FDA under an Emergency Use Authorization (EUA). This EUA will remain  in effect (meaning this test can be used) for the duration of the COVID-19 declaration under Section 564(b)(1) of the Act,  21 U.S.C.section 360bbb-3(b)(1), unless the authorization is terminated  or revoked sooner.       Influenza A by PCR NEGATIVE NEGATIVE Final   Influenza B by PCR NEGATIVE NEGATIVE Final    Comment: (NOTE) The Xpert Xpress SARS-CoV-2/FLU/RSV plus assay is intended as an aid in the diagnosis of influenza from Nasopharyngeal swab specimens and should not be used as a sole basis for treatment. Nasal washings and aspirates are unacceptable for Xpert Xpress SARS-CoV-2/FLU/RSV testing.  Fact Sheet for Patients: [employer]  Fact Sheet for Healthcare Providers: [employer]it  This test is not yet approved or cleared by the [place] FDA and has been authorized for detection and/or diagnosis of SARS-CoV-2 by FDA under an Emergency Use Authorization (EUA). This EUA will remain in effect (meaning this test can be used) for the duration of the COVID-19 declaration under Section 564(b)(1) of the Act, 21 U.S.C. section 360bbb-3(b)(1), unless the authorization is terminated  or revoked.  Performed at [facility], [address], [place], [place] [address]   Blood Culture (routine x 2)     Status: None   Collection Time: 04/28/21  7:05 PM   Specimen: BLOOD  Result Value Ref Range Status   Specimen Description BLOOD  RT FOOT  Final   Special Requests BOTTLES DRAWN AEROBIC ONLY  Final   Culture   Final    NO GROWTH 5 DAYS Performed at [facility], [address], [place], [place] [address]    Report Status 05/03/2021 FINAL  Final  Blood Culture (routine x 2)     Status: None   Collection Time: 04/28/21  7:05 PM   Specimen: BLOOD  Result Value Ref Range Status   Specimen Description BLOOD  LFOA  Final   Special Requests   Final    BOTTLES DRAWN AEROBIC AND ANAEROBIC Blood Culture adequate volume   Culture   Final    NO GROWTH 5 DAYS Performed at [facility], [address] [place], [place] [address]    Report Status 05/03/2021 FINAL  Final  Urine Culture     Status: None   Collection Time: 04/28/21 10:17 PM   Specimen: Urine, Random  Result Value Ref Range Status   Specimen Description   Final    URINE, RANDOM Performed at [facility], [address] [place], [place] [address]    Special Requests   Final    NONE Performed at [facility], [address], [place], [place] [address]    Culture   Final    NO GROWTH Performed at [facility], [address], [place], [place] [address]    Report Status 04/29/2021 FINAL  Final  MRSA Next Gen by PCR, Nasal     Status: None   Collection Time: 04/29/21 12:55 AM   Specimen: Nasal Mucosa; Nasal Swab  Result Value Ref Range Status   MRSA by PCR Next Gen NOT DETECTED NOT DETECTED Final    Comment: (NOTE) The GeneXpert MRSA Assay (FDA approved for NASAL specimens only), is one component of a comprehensive MRSA colonization surveillance program. It is not intended to diagnose MRSA infection nor to guide or monitor treatment for MRSA  infections. Test performance is not FDA approved in patients less than 61 years old. Performed at [facility], [address], [place], [place] [address]          Radiology Studies: No results found.      Scheduled Meds:  arformoterol  15  mcg Nebulization BID   atorvastatin  20 mg Oral QHS   budesonide (PULMICORT) nebulizer solution  0.5 mg Nebulization BID   buPROPion ER  100 mg Oral Daily   Chlorhexidine Gluconate Cloth  6 each Topical Daily   cholecalciferol  1,000 Units Oral Daily   cloZAPine  50 mg Oral QHS   dorzolamide-timolol  1 drop Both Eyes BID   enoxaparin (LOVENOX) injection  40 mg Subcutaneous Q24H   ezetimibe  10 mg Oral QHS   feeding supplement  237 mL Oral TID BM   irbesartan  150 mg Oral Daily   isosorbide mononitrate  30 mg Oral Q12H   latanoprost  1 drop Both Eyes QHS   melatonin  5 mg Oral QHS   multivitamin with minerals  1 tablet Oral Daily   pantoprazole  40 mg Oral Daily   revefenacin  175 mcg Nebulization Daily   tamsulosin  0.4 mg Oral Daily   Continuous Infusions:  sodium chloride Stopped (05/04/21 1557)   dextrose 5% lactated ringers 75 mL/hr at 05/06/21 0659     LOS: 8 days    Time spent: 25 mins     [name], MD [facility] Pager [phone]  If 7PM-7AM, please contact night-coverage  05/06/2021, 8:08 AM

## 2021-05-06 NOTE — Consult Note (Signed)
Consultation Note Date: 05/06/2021   Patient Name: Brandon [name]  DOB: [birth year]  MRN: [id number]  Age / Sex: 65 y.o., male  PCP: Brandon Marble, MD Referring Physician: Wyvonnia Dusky, MD  Reason for Consultation: Establishing goals of care  HPI/Patient Profile: 65 y.o. male  with past medical history of schizoaffective disorder, CAD, HTN, COPD, acute hyponatremia, BPH, acute respiratory failure with hypoxia and hypercapnia, and acute metabolic encephalopathy admitted on 04/28/2021 with hypoxia and hypotension from SNF [name].   This is his 6th admission in 2022. Pt has been intubated and self extubated 9/24. PLan is to d/c to SNF for rehab.  PMT consulted to discussion Brandon [name].  Clinical Assessment and Goals of Care: I have reviewed medical records including EPIC notes, labs and imaging, received report from bedside RN, assessed the patient and then met with patient at bedside  to discuss diagnosis prognosis, GOC, EOL wishes, disposition and options.  Pt is hoarse and unable to vocalize. He is able to write som words but after writing a few words it turns into scribble. Pt has to be continually re-oriented to complete communication on paper.   I introduced Palliative Medicine as specialized medical care for people living with serious illness. It focuses on providing relief from the symptoms and stress of a serious illness. The goal is to improve quality of life for both the patient and the family.   I mentioned that his HCPOA is his brother and that I would like to include him in our discussions. He wrote that he 'gave HCPOA to Brandon [name] too soon'. I educated him that Brandon [name] is not making all of his healthcare decisions as this time since the patient can communicate his wishes. I suggested that the patient, Brandon [name], and myself meet in person to discuss [place] and ACP moving forward. I discussed how ACP  planning involves setting parameters on his healthcare decisions before an event happens in the future. I reviewed that we will discuss concepts specific to artificial feeding and hydration and rehospitalization.  With the patient's permission I spoke with Brandon [name], who plans to be bedside on his lunch hour between 12 and 1pm.   I met with Brandon [name] and the patient bedside this afternoon. I noted in the chart that the patient was pointing to his DNR bracelet when he met with the chaplain earlier today. I asked Brandon [name] is he understood what a DNR was and he shook his head no. I described that a DNR implies that should his heart and lungs stop and he physically passes away then the natural disease process will be allowed to occur. I educated both the patient and his brother that a DNR means we will not call a code blue and we will not resuscitate him. I also highlighted that a DNR does not mean we do not provide care. I shared that we will continue to treat the treatable, provide PT/OT, and do all medical measures within reason to improve the patient's health status.  The difference between aggressive medical  intervention and comfort care was introduced. I reviewed that there is a medical orders for scope of treatment (MOST) form that helps patients and families discuss what medical interventions that patient would want for future health events requiring hospitalization. I outlined that rehospitalization has different levels of care, such as comfort, limited scope, and full intervention. I reviewed that artifical nutrition involves a feeding tube and giving fluids when the patient is unable to swallow/eat/drink on his own. The patient shook his head no when I reviewed the feeding tube. He attempted to write something down using his dry erase board but was unable to make clear letters. I printed an alphabet and the patient was able to point to the letters and spell his thoughts. He pointed and spelled out, "I thank  you both for all. I understand." I asked if he had any questions and he pointed to no.   We discussed patient's current illness and what it means in the larger context of patient's on-going co-morbidities. He had already written down on his white board to his brother that he has enjoyed the time that he has had in his life so far.  His brother shared with me that the patient's two nieces were able to visit with him yesterday.  The brother said that this brought the patient a lot of joy and the patient nodded yes in agreement.  The patient began to yawn frequently and was not pointing at letters to spell anything.  I asked if he was tired and he nodded yes.  I asked if he was in pain and he pointed to no.  I asked if it was okay I return tomorrow so we could continue these discussions.  He pointed to yes.  The patient's brother shared that he really appreciated the discussions regarding the patient's DNR status.  He said it made him feel a lot better knowing those details.  Discussed with patient/family the importance of continued conversation with family and the medical providers regarding overall plan of care and treatment options, ensuring decisions are within the context of the patient's values and GOCs.    I will continue to monitor this patient and plan to visit him again tomorrow.  Questions and concerns were addressed. The family was encouraged to call with questions or concerns.   Primary Decision Maker HCPOA  Code Status/Advance Care Planning: DNR  Prognosis:   Unable to determine  Discharge Planning: To Be Determined  Primary Diagnoses: Present on Admission:  Essential hypertension  COPD (chronic obstructive pulmonary disease) (HCC)  Acute hyponatremia  Schizoaffective disorder, bipolar type (HCC)  BPH (benign prostatic hyperplasia)  Schizophrenia (Brandon [name])   Physical Exam Vitals and nursing note reviewed.  HENT:     Head: Normocephalic and atraumatic.     Mouth/Throat:      Mouth: Mucous membranes are moist.  Cardiovascular:     Rate and Rhythm: Tachycardia present.  Pulmonary:     Effort: Pulmonary effort is normal.     Comments: Brandon [name] Skin:    General: Skin is warm and dry.  Neurological:     Mental Status: He is alert. Mental status is at baseline.  Psychiatric:        Mood and Affect: Mood normal.        Behavior: Behavior normal.    Vital Signs: BP 131/80   Pulse 83   Temp 98.2 F (36.8 C) (Oral)   Resp (!) 26   Ht 6' (1.829 m)   Wt 75.7 kg  SpO2 100%   BMI 22.63 kg/m  Pain Scale: 0-10   Pain Score: 0-No pain SpO2: SpO2: 100 % O2 Device:SpO2: 100 % O2 Flow Rate: .O2 Flow Rate (L/min): 2 L/min  Palliative Assessment/Data: 50%     I discussed this patient's plan of care with patient, patient's brother/HCPOA Brandon [name], and bedside RN.  Thank you for this consult. Palliative medicine will continue to follow and assist holistically.   Time Total: 70 minutes Greater than 50%  of this time was spent counseling and coordinating care related to the above assessment and plan.  Signed by: Jordan Hawks, DNP, FNP-BC Palliative Medicine    Please contact Palliative Medicine Team phone at [phone] for questions and concerns.  For individual provider: See Shea Evans

## 2021-05-06 NOTE — Progress Notes (Signed)
Physical Therapy Treatment Patient Details Name: Brandon [name] MRN: [id number] DOB: 02-08-1956 Today's Date: 05/06/2021   History of Present Illness Pt is a 65 year old male with PMH including schizophrenia, seizures (ETOH withdrawal related), and ETOH abuse presenting to [facility] ED on 04/28/2021 from [facility] via EMS.  Staff at [facility] called EMS after patient was found to be unresponsive and reportedly hypotensive in the 50s for an unknown amount of time.  At [facility] patient became hypoxic with SPO2 in the 70s and was too confused to safely tolerate BiPAP requiring emergent intubation and mechanical ventilatory support. Pt was eventully extubated but needed to be reintubated 9/20 and was again extubated 9/21.  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    PT Comments    Pt motivated to participate with therapy this date but was very weak functionally.  Pt required extensive physical assistance with all functional tasks and once in standing was unable to advance either LE even with +2 assist.  Pt presented with posterior and left lateral instability in sitting but improved with balance training during the session. Pt's SpO2 and HR were both WNL on room air. Pt will benefit from PT services in a SNF setting upon discharge to safely address deficits listed in patient problem list for decreased caregiver assistance and eventual return to PLOF.      Recommendations for follow up therapy are one component of a multi-disciplinary discharge planning process, led by the attending physician.  Recommendations may be updated based on patient status, additional functional criteria and insurance  authorization.  Follow Up Recommendations  SNF;Supervision/Assistance - 24 hour     Equipment Recommendations  Other (comment) (TBD at next venue of care)    Recommendations for Other Services       Precautions / Restrictions Precautions Precautions: Fall Restrictions Weight Bearing Restrictions: No     Mobility  Bed Mobility Overal bed mobility: Needs Assistance Bed Mobility: Sit to Supine;Supine to Sit;Rolling Rolling: Max assist   Supine to sit: Max assist;HOB elevated;+2 for physical assistance Sit to supine: +2 for physical assistance;Max assist   General bed mobility comments: +2 Max A to manage trunk and BLEs    Transfers Overall transfer level: Needs assistance Equipment used: Rolling walker (2 wheeled) Transfers: Lateral/Scoot Transfers Sit to Stand: Max assist;+2 physical assistance;From elevated surface        Lateral/Scoot Transfers: Max assist    Ambulation/Gait             General Gait Details: Unable to advance either LE   Stairs             Wheelchair Mobility    Modified [name] (Stroke Patients Only)       Balance Overall balance assessment: Needs assistance Sitting-balance support: Bilateral upper extremity supported;Feet supported Sitting balance-[name] Scale: Poor Sitting balance - Comments: Occasional min A for static sitting balance Postural control: Posterior lean;Left lateral lean   Standing balance-[name] Scale: Poor Standing balance comment: Mod to max A for static standing balance                            Cognition Arousal/Alertness: Awake/alert Behavior During Therapy: WFL for tasks assessed/performed Overall Cognitive Status: Difficult to assess Area of Impairment:  Following commands;Safety/judgement;Awareness                       Following Commands: Follows multi-step commands with increased time Safety/Judgement: Decreased awareness of safety;Decreased awareness of deficits             Exercises Total Joint Exercises Hip ABduction/ADduction: Both;Strengthening;AAROM;5 reps Straight Leg Raises: AAROM;Strengthening;Both;5 reps Other Exercises Other Exercises: Anterior and R lateral weight shifting activities in sitting Other Exercises: Multiple rolling left/right    General Comments General comments (skin integrity, edema, etc.): SpO2 mid 90s t/o, HR WFL, RR mid 30s seated EOB, resoleved to mid 20s c PLB and rest breaks      Pertinent Vitals/Pain Pain Assessment: Faces Pain Score: 4  Pain Location: Bottom Pain Descriptors / Indicators: Grimacing Pain Intervention(s): Repositioned;Monitored during session    Home Living                      Prior Function            PT Goals (current goals can now be found in the care plan section) Acute Rehab PT Goals Patient Stated Goal: to have more therapy Progress towards PT goals: Progressing toward goals    Frequency    Min 2X/week      PT Plan Current plan remains appropriate    Co-evaluation              AM-PAC PT "6 Clicks" Mobility   Outcome Measure  Help needed turning from your back to your side while in a flat bed without using bedrails?: A Lot Help needed moving from lying on your back to sitting on the side of a flat bed without using bedrails?: Total Help needed moving to and from a bed to a chair (including a wheelchair)?: Total Help needed standing up from a chair using your arms (e.g., wheelchair or bedside chair)?: Total Help needed to walk in hospital room?: Total Help needed climbing 3-5 steps with a railing? : Total 6 Click Score: 7    End of Session Equipment Utilized During Treatment: Gait belt Activity Tolerance: Patient tolerated treatment well Patient left: in bed;with call bell/phone within reach;with bed alarm set;Other (comment) (Pt left in partial L sidelying secondary to pain in his bottom) Nurse Communication: Mobility status;Other (comment) (Pt c/o  pain in his bottom with some redness noted; pt left in L sidelying) PT Visit Diagnosis: Unsteadiness on feet (R26.81);Muscle weakness (generalized) (M62.81);Difficulty in walking, not elsewhere classified (R26.2)     Time: 7494-4967 PT Time Calculation (min) (ACUTE ONLY): 31 min  Charges:  $Therapeutic Exercise: 8-22 mins $Therapeutic Activity: 8-22 mins                     D. Scott Kimblery Diop PT, DPT 05/06/21, 3:57 PM

## 2021-05-06 NOTE — Progress Notes (Signed)
Occupational Therapy Treatment Patient Details Name: Brandon [name] MRN: [id number] DOB: [birth year] Today's Date: 05/06/2021   History of present illness Pt is a 65 year old male with PMH including schizophrenia, seizures (ETOH withdrawal related), and ETOH abuse presenting to [facility] ED on 04/28/2021 from [facility] via EMS.  Staff at [facility] called EMS after patient was found to be unresponsive and reportedly hypotensive in the 50s for an unknown amount of time.  At [facility] patient became hypoxic with SPO2 in the 70s and was too confused to safely tolerate BiPAP requiring emergent intubation and mechanical ventilatory support. Pt was eventully extubated but needed to be reintubated 9/20 and was again extubated 9/21.  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   OT comments  Mr Pann was seen for OT treatment on this date. Upon arrival to room pt reclined in bed, eager for therapy. Pt continues to be hypophonic requiring increased time for communication (nod/shake head, hand writing, and one word sentences). SLP notified pt would benefit from AAC device, will plan to bring next session if SLP unable to provide.  Pt requires MAX A for bed mobility. MAX A don/doff B socks seated EOB. CGA + BUE support for static sitting balance decreasing to MIN A with no UE support. Pt tolerated ~10 min sitting. MIN A don/doff gown seated EOB - assist for sitting balance. RR increased to mid 30s seated EOB, resolved with PLB and rest. Pt making good progress toward goals. Pt continues to benefit from skilled OT services to maximize return to PLOF and minimize risk of future falls, injury, caregiver burden, and  readmission. Will continue to follow POC. Discharge recommendation remains appropriate.     Recommendations for follow up therapy are one component of a multi-disciplinary discharge planning process, led by the attending physician.  Recommendations may be updated based on patient status, additional functional criteria and insurance authorization.    Follow Up Recommendations  SNF;Supervision/Assistance - 24 hour    Equipment Recommendations  Other (comment) (defer to next venue of care)    Recommendations for Other Services      Precautions / Restrictions Precautions Precautions: Fall Restrictions Weight Bearing Restrictions: No       Mobility Bed Mobility Overal bed mobility: Needs Assistance Bed Mobility: Rolling;Supine to Sit;Sit to Supine Rolling: Max assist   Supine to sit: Max assist;HOB elevated Sit to supine: +2 for physical assistance;Max assist        Transfers Overall transfer level: Needs assistance   Transfers: Lateral/Scoot Transfers          Lateral/Scoot Transfers: Max assist      Balance Overall balance assessment: Needs assistance Sitting-balance support: Bilateral upper extremity supported;Feet supported Sitting balance-[name] Scale: Fair Sitting balance - Comments: MIN A w/o UE support                                   ADL either performed or assessed with clinical judgement   ADL Overall ADL's : Needs assistance/impaired  General ADL Comments: MAX A don/doff B socks seated EOB. CGA + BUE support for static sitting balance decreasing to MIN A with no UE support. Pt tolerated ~10 min sitting. MIN A don/doff gown seated EOB - assist for sitting balance.      Cognition Arousal/Alertness: Awake/alert Behavior During Therapy: WFL for tasks assessed/performed Overall Cognitive Status: No family/caregiver present to determine baseline cognitive functioning Area of  Impairment: Following commands;Safety/judgement;Awareness                       Following Commands: Follows multi-step commands with increased time Safety/Judgement: Decreased awareness of safety;Decreased awareness of deficits              Exercises Exercises: Other exercises Other Exercises Other Exercises: Pt educated re: OT role, DME recs, d/c recs, falls prevention, POC, HEP Other Exercises: LBD, UBD, rolling, sup<>sit, sitting balance/tolerance, lateral scoot      General Comments SpO2 mid 90s t/o, HR WFL, RR mid 30s seated EOB, resoleved to mid 20s c PLB and rest breaks    Pertinent Vitals/ Pain       Pain Assessment: No/denies pain   Frequency  Min 2X/week        Progress Toward Goals  OT Goals(current goals can now be found in the care plan section)  Progress towards OT goals: Progressing toward goals  Acute Rehab OT Goals Patient Stated Goal: to have more therapy OT Goal Formulation: With patient Time For Goal Achievement: 05/18/21 Potential to Achieve Goals: Good ADL Goals Pt Will Perform Grooming: with min assist;sitting Pt Will Perform Lower Body Dressing: with mod assist;with max assist;sitting/lateral leans;with adaptive equipment Pt Will Transfer to Toilet: squat pivot transfer;with max assist;bedside commode Pt Will Perform Toileting - Clothing Manipulation and hygiene: with mod assist;with max assist;sitting/lateral leans Pt/caregiver will Perform Home Exercise Program: Increased strength;Both right and left upper extremity;With minimal assist Additional ADL Goal #1: Pt will demo F static sitting balance with UE support for 3 minutes EOB to improve trunk strength.  Plan Discharge plan remains appropriate;Frequency remains appropriate    Co-evaluation                 AM-PAC OT "6 Clicks" Daily Activity     Outcome Measure   Help from another person eating meals?: A Little Help from another person taking care of personal  grooming?: A Little Help from another person toileting, which includes using toliet, bedpan, or urinal?: A Lot Help from another person bathing (including washing, rinsing, drying)?: A Lot Help from another person to put on and taking off regular upper body clothing?: A Little Help from another person to put on and taking off regular lower body clothing?: A Lot 6 Click Score: 15    End of Session Equipment Utilized During Treatment: Oxygen  OT Visit Diagnosis: Unsteadiness on feet (R26.81)   Activity Tolerance Patient tolerated treatment well   Patient Left in bed;with call bell/phone within reach   Nurse Communication          Time: 2025-4270 OT Time Calculation (min): 29 min  Charges: OT General Charges $OT Visit: 1 Visit OT Treatments $Self Care/Home Management : 23-37 mins  Kathie Dike, M.S. OTR/L  05/06/21, 1:51 PM  ascom [phone]

## 2021-05-06 NOTE — Progress Notes (Signed)
Chaplain Maggie made follow up visit with pt at bedside. Pt was trying to communicate with paper and pen. He asked for fresh, dry paper which Chaplain provided. He pointed to DNR band on his wrist and appeared to want to communicate about the status. It was difficult to determine what pt was wanting to say. It is difficult for pt to write words as his hand is not steady and it is hard to hold the pen with strength to write with ball point pen. Pt did acknowledge PT and OT are helpful to him and he hopes for more. Chaplain left white board and dry erase marker for pt to try out and will follow up later today.

## 2021-05-07 DIAGNOSIS — F250 Schizoaffective disorder, bipolar type: Secondary | ICD-10-CM | POA: Diagnosis not present

## 2021-05-07 DIAGNOSIS — R4189 Other symptoms and signs involving cognitive functions and awareness: Secondary | ICD-10-CM | POA: Diagnosis not present

## 2021-05-07 DIAGNOSIS — G9341 Metabolic encephalopathy: Secondary | ICD-10-CM | POA: Diagnosis not present

## 2021-05-07 DIAGNOSIS — J9601 Acute respiratory failure with hypoxia: Secondary | ICD-10-CM | POA: Diagnosis not present

## 2021-05-07 DIAGNOSIS — J9602 Acute respiratory failure with hypercapnia: Secondary | ICD-10-CM | POA: Diagnosis not present

## 2021-05-07 DIAGNOSIS — E871 Hypo-osmolality and hyponatremia: Secondary | ICD-10-CM | POA: Diagnosis not present

## 2021-05-07 LAB — CBC WITH DIFFERENTIAL/PLATELET
Abs Immature Granulocytes: 0.08 10*3/uL — ABNORMAL HIGH (ref 0.00–0.07)
Basophils Absolute: 0 10*3/uL (ref 0.0–0.1)
Basophils Relative: 1 %
Eosinophils Absolute: 0.1 10*3/uL (ref 0.0–0.5)
Eosinophils Relative: 2 %
HCT: 34.9 % — ABNORMAL LOW (ref 39.0–52.0)
Hemoglobin: 11.4 g/dL — ABNORMAL LOW (ref 13.0–17.0)
Immature Granulocytes: 1 %
Lymphocytes Relative: 23 %
Lymphs Abs: 1.7 10*3/uL (ref 0.7–4.0)
MCH: 29.2 pg (ref 26.0–34.0)
MCHC: 32.7 g/dL (ref 30.0–36.0)
MCV: 89.3 fL (ref 80.0–100.0)
Monocytes Absolute: 0.8 10*3/uL (ref 0.1–1.0)
Monocytes Relative: 11 %
Neutro Abs: 4.5 10*3/uL (ref 1.7–7.7)
Neutrophils Relative %: 62 %
Platelets: 288 10*3/uL (ref 150–400)
RBC: 3.91 MIL/uL — ABNORMAL LOW (ref 4.22–5.81)
RDW: 13.9 % (ref 11.5–15.5)
WBC: 7.2 10*3/uL (ref 4.0–10.5)
nRBC: 0 % (ref 0.0–0.2)

## 2021-05-07 LAB — BASIC METABOLIC PANEL
Anion gap: 11 (ref 5–15)
BUN: 8 mg/dL (ref 8–23)
CO2: 26 mmol/L (ref 22–32)
Calcium: 8.6 mg/dL — ABNORMAL LOW (ref 8.9–10.3)
Chloride: 94 mmol/L — ABNORMAL LOW (ref 98–111)
Creatinine, Ser: 0.55 mg/dL — ABNORMAL LOW (ref 0.61–1.24)
GFR, Estimated: >60 mL/min (ref >60)
Glucose, Bld: 83 mg/dL (ref 70–99)
Potassium: 3.8 mmol/L (ref 3.5–5.1)
Sodium: 131 mmol/L — ABNORMAL LOW (ref 135–145)

## 2021-05-07 LAB — GLUCOSE, CAPILLARY
Glucose-Capillary: 104 mg/dL — ABNORMAL HIGH (ref 70–99)
Glucose-Capillary: 105 mg/dL — ABNORMAL HIGH (ref 70–99)
Glucose-Capillary: 84 mg/dL (ref 70–99)
Glucose-Capillary: 85 mg/dL (ref 70–99)
Glucose-Capillary: 88 mg/dL (ref 70–99)
Glucose-Capillary: 90 mg/dL (ref 70–99)

## 2021-05-07 LAB — MAGNESIUM: Magnesium: 1.7 mg/dL (ref 1.7–2.4)

## 2021-05-07 LAB — PHOSPHORUS: Phosphorus: 3.9 mg/dL (ref 2.5–4.6)

## 2021-05-07 MED ORDER — DOCUSATE SODIUM 100 MG PO CAPS
200.0000 mg | ORAL_CAPSULE | Freq: Two times a day (BID) | ORAL | Status: DC
  Administered 2021-05-07 – 2021-05-17 (×21): 200 mg via ORAL
  Filled 2021-05-07 (×21): qty 2

## 2021-05-07 NOTE — Progress Notes (Signed)
Admitted pt from ICU 13 to 250, pt oriented to room and call light. VSS.

## 2021-05-07 NOTE — Progress Notes (Signed)
Palliative Care Progress Note, Assessment & Plan   Patient Name: Brandon [name]       Date: 05/07/2021 DOB: 1956-01-15  Age: 65 y.o. MRN#: [id number] Attending Physician: [name], MD Primary Care Physician: Sherron Monday, MD Admit Date: 04/28/2021  Reason for Consultation/Follow-up: Establishing goals of care  Subjective: Pt is resting in bed with [place] in place and NAD. Brother Casimiro Needle is at bedside attempting to have patient spell words on his alphabet board.  HPI: 65 y.o. male  with past medical history of schizoaffective disorder, CAD, HTN, COPD, acute hyponatremia, BPH, acute respiratory failure with hypoxia and hypercapnia, and acute metabolic encephalopathy admitted on 04/28/2021 with hypoxia and hypotension from SNF [facility].    This is his 6th admission in 2022. Pt has been intubated and self extubated 9/24. PLan is to d/c to SNF for rehab.   PMT consulted to discussion GOC.  Plan of Care: I visited with the patient and his brother Casimiro Needle at bedside.  The brother shares that he and the patient had been using the alphabet board.  The patient appears tired and yawns frequently.  I gave him a brief review of what we had discussed yesterday as far as CODE STATUS, rehospitalization, artificial feeding, and end-of-life decisions.  I shared the MOST form.  I reviewed that outlines scope of treatment for future medical decisions.  I again highlighted that it is important to talk about these levels of care prior to another event occurring.  I shared that the MOST form should be completed will not change his plan of care at the moment.  The plan is still for the patient to go to rehabilitation.  Casimiro Needle shared that he did not know that there were additional papers he could use outside of  the healthcare power of attorney to help determine what the patient's wishes would be.  He was very grateful for this documentation.  He said that the patient was too tired to discuss these decision now.  Casimiro Needle himself needed to leave for a work meeting.  He said that he would take it with him and certainly discuss it with the patient in the future.   I again conveyed the importance of advanced care planning in light of the patient's current health status and co-morbidities. The patient nodded his head in agreement.  When asked if he was in pain he pointed to the block on his alphabet board saying no.  Casimiro Needle shared that he had no further questions at this time.    I shared that I will be off service tomorrow but would return on Thursday.  Should the patient still be in the hospital I will round on him again.  Patient mouthed thank you.  Casimiro Needle the patient's brother appreciated the visit.  All questions and concerns were addressed.  Family was encouraged to call with any concerns.  Code Status: DNR  Prognosis:  Unable to determine  Discharge Planning: Skilled Nursing Facility for rehab with Palliative care service follow-up  Recommendations/Plan: Treat the treatable MOST form given and discussed D/c to SNF with Outpatient Palliative Services  Care plan was discussed with patient, patient's brother  Length of Stay: 9  Physical  Exam Vitals and nursing note reviewed.  HENT:     Head: Normocephalic.     Mouth/Throat:     Mouth: Mucous membranes are moist.     Comments: hoarseness Cardiovascular:     Rate and Rhythm: Normal rate.  Pulmonary:     Effort: Pulmonary effort is normal.  Abdominal:     Palpations: Abdomen is soft.  Skin:    General: Skin is warm and dry.  Neurological:     Mental Status: He is alert. Mental status is at baseline.  Psychiatric:        Thought Content: Thought content normal.            Vital Signs: BP 108/73 (BP Location: Right Arm)   Pulse 99    Temp 98 F (36.7 C)   Resp 17   Ht 6' (1.829 m)   Wt 75.7 kg   SpO2 93%   BMI 22.63 kg/m  SpO2: SpO2: 93 % O2 Device: O2 Device: Nasal Cannula O2 Flow Rate: O2 Flow Rate (L/min): 3 L/min      Palliative Assessment/Data: 50%       Total Time 20 minutes Prolonged Time Billed  no   Greater than 50%  of this time was spent counseling and coordinating care related to the above assessment and plan.  Thank you for allowing the Palliative Medicine Team to assist in the care of this patient.  Samara Deist L. Manon Hilding, FNP-BC Palliative Medicine Team Team Phone # [phone]

## 2021-05-07 NOTE — Progress Notes (Addendum)
PROGRESS NOTE   HPI was taken from NP [place]: 65 year old male presenting to [facility] ED on 04/28/2021 from SN[facility] via EMS.  Staff at [facility] called EMS after patient was found to be unresponsive and reportedly hypotensive in the 50s for an unknown amount of time.  Staff had stated earlier they thought the patient had been napping.  EMS started IV fluids and continued chronic oxygen in route, with reports of blood pressure improvement.  Per ED documentation upon arrival patient responsive to sternal rub, altered and not following commands. ED course: Patient initially appeared to improve with IV fluids and was protecting his airway.  However within 2 hours after arrival patient became hypoxic with SPO2 in the 70s and was too confused to safely tolerate BiPAP requiring emergent intubation and mechanical ventilatory support.  Of note this is the patient's sixth hospital admission in 2022 for a combination of hyponatremia/hypoxia with or without unresponsiveness.  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   Brandon [name]  EAV:[id number] DOB: 11/20/1955 DOA: 04/28/2021 PCP: Sherron Monday, MD   Assessment & Plan:   Principal Problem:   Schizoaffective disorder, bipolar type (HCC) Active Problems:   Coronary artery disease   Essential hypertension   COPD (chronic obstructive pulmonary disease) (HCC)   Acute hyponatremia   Schizophrenia (HCC)   BPH (benign prostatic hyperplasia)   Acute respiratory failure with hypoxia and hypercapnia (HCC)   Acute metabolic encephalopathy   Endotracheally intubated   On mechanically assisted ventilation (HCC)   Altered mental status   Palliative care by specialist   DNR (do not resuscitate)   Acute on chronic hypoxic & hypercapnic respiratory failure: likely secondary to aspiration pneumonia & hx of advanced COPD. Continue w/ bronchodilators, chest PT, & encourage incentive  spirometry. Palliative care is following   Acute encephalopathy: w/ hx of schizophrenia vs schizoaffective disorder, ethanol related seizures & ethanol abuse. Likely secondary to above. Continue on clozapine, wellbutrin as per psych. Re-orient prn. Pt does not speak but sometimes mouth the words yes and no. Not pt's baseline as per pt's brother but understands pt has been on a progressive decline. Palliative care is following   Schizophrenia vs schizoaffective disorder: continue on home dose of wellbutrin, clonzapine   Constipation:continue on miralax, senna, colace & dulcolax   Orthostatic hypotension: resolved   Acute on chronic hyponatremia: labile. Etiology unclear, possible SIADH as dehydration has resolved. Labile. Will continue to monitor   Likely BPH: continue on tamsulosin   DVT prophylaxis: lovenox Code Status: DNR Family Communication:  Disposition Plan:  recs SNF  Level of care: Progressive Cardiac  Status is: Inpatient  Remains inpatient appropriate because:IV treatments appropriate due to intensity of illness or inability to take PO and Inpatient level of care appropriate due to severity of illness, has not had a bowel movement, can likely d/c back to SNF in 24-48 hours   Dispo: The patient is from:  home facility               Anticipated d/c is to: home facility               Patient currently is not medically stable to d/c.   Difficult to place patient : unclear    Consultants:  ICU   Procedures:   Antimicrobials:    Subjective: Pt does not verbalize any complaints   Objective: Vitals:   05/06/21 2035 05/06/21 2100 05/07/21 0300 05/07/21 0416  BP:  Marland Kitchen)  113/96 116/82 129/89  Pulse:  95 94 91  Resp:  (!) 30 (!) 30 17  Temp:    98.5 F (36.9 C)  TempSrc:    Oral  SpO2: 100% 97% 97% 93%  Weight:      Height:        Intake/Output Summary (Last 24 hours) at 05/07/2021 0741 Last data filed at 05/06/2021 1900 Gross per 24 hour  Intake --  Output  1100 ml  Net -1100 ml   Filed Weights   05/04/21 0500 05/05/21 0000 05/05/21 0443  Weight: 79.7 kg 78.3 kg 75.7 kg    Examination:  General exam: Appears calm & comfortable  Respiratory system: diminished breath sounds b/l  Cardiovascular system: S1 & S2+. No rubs or clicks    Gastrointestinal system: Abd is soft, NT, ND & hypoactive bowel sounds  Central nervous system: Alert and awake. Moves all extremities  Psychiatry: Judgement and insight appear poor. Flat mood and affect     Data Reviewed: I have personally reviewed following labs and imaging studies  CBC: Recent Labs  Lab 05/02/21 0519 05/04/21 0342 05/07/21 0436  WBC 9.8 10.0 7.2  NEUTROABS  --   --  4.5  HGB 12.3* 12.1* 11.4*  HCT 36.8* 38.6* 34.9*  MCV 88.5 90.4 89.3  PLT 345 287 288   Basic Metabolic Panel: Recent Labs  Lab 05/02/21 0519 05/04/21 0342 05/05/21 0625 05/06/21 0512 05/07/21 0436  NA 131* 134* 133* 132* 131*  K 3.8 3.6 3.7 3.7 3.8  CL 93* 95* 94* 96* 94*  CO2 30 30 31 30 26   GLUCOSE 89 78 96 115* 83  BUN 12 13 10  7* 8  CREATININE 0.58* 0.55* 0.48* 0.41* 0.55*  CALCIUM 9.0 8.4* 8.5* 8.5* 8.6*  MG 1.8 1.7 2.1 1.6* 1.7  PHOS 3.5 4.3 2.9 3.7 3.9   GFR: Estimated Creatinine Clearance: 98.6 mL/min (A) (by C-G formula based on SCr of 0.55 mg/dL (L)). Liver Function Tests: Recent Labs  Lab 05/01/21 0504  AST 19  ALT 16  ALKPHOS 81  BILITOT 0.8  PROT 5.2*  ALBUMIN 2.7*   No results for input(s): LIPASE, AMYLASE in the last 168 hours. No results for input(s): AMMONIA in the last 168 hours. Coagulation Profile: No results for input(s): INR, PROTIME in the last 168 hours.  Cardiac Enzymes: No results for input(s): CKTOTAL, CKMB, CKMBINDEX, TROPONINI in the last 168 hours. BNP (last 3 results) No results for input(s): PROBNP in the last 8760 hours. HbA1C: No results for input(s): HGBA1C in the last 72 hours. CBG: Recent Labs  Lab 05/06/21 1137 05/06/21 1536 05/06/21 1945  05/07/21 0000 05/07/21 0449  GLUCAP 97 105* 96 85 84   Lipid Profile: No results for input(s): CHOL, HDL, LDLCALC, TRIG, CHOLHDL, LDLDIRECT in the last 72 hours. Thyroid Function Tests: No results for input(s): TSH, T4TOTAL, FREET4, T3FREE, THYROIDAB in the last 72 hours. Anemia Panel: No results for input(s): VITAMINB12, FOLATE, FERRITIN, TIBC, IRON, RETICCTPCT in the last 72 hours. Sepsis Labs: No results for input(s): PROCALCITON, LATICACIDVEN in the last 168 hours.   Recent Results (from the past 240 hour(s))  Resp Panel by RT-PCR (Flu A&B, Covid) Nasopharyngeal Swab     Status: None   Collection Time: 04/28/21  7:05 PM   Specimen: Nasopharyngeal Swab; Nasopharyngeal(NP) swabs in vial transport medium  Result Value Ref Range Status   SARS Coronavirus 2 by RT PCR NEGATIVE NEGATIVE Final    Comment: (NOTE) SARS-CoV-2 target nucleic acids are NOT  DETECTED.  The SARS-CoV-2 RNA is generally detectable in upper respiratory specimens during the acute phase of infection. The lowest concentration of SARS-CoV-2 viral copies this assay can detect is 138 copies/mL. A negative result does not preclude SARS-Cov-2 infection and should not be used as the sole basis for treatment or other patient management decisions. A negative result may occur with  improper specimen collection/handling, submission of specimen other than nasopharyngeal swab, presence of viral mutation(s) within the areas targeted by this assay, and inadequate number of viral copies(<138 copies/mL). A negative result must be combined with clinical observations, patient history, and epidemiological information. The expected result is Negative.  Fact Sheet for Patients:  BloggerCourse.com  Fact Sheet for Healthcare Providers:  SeriousBroker.it  This test is no t yet approved or cleared by the [place] FDA and  has been authorized for detection and/or diagnosis of  SARS-CoV-2 by FDA under an Emergency Use Authorization (EUA). This EUA will remain  in effect (meaning this test can be used) for the duration of the COVID-19 declaration under Section 564(b)(1) of the Act, 21 U.S.C.section 360bbb-3(b)(1), unless the authorization is terminated  or revoked sooner.       Influenza A by PCR NEGATIVE NEGATIVE Final   Influenza B by PCR NEGATIVE NEGATIVE Final    Comment: (NOTE) The Xpert Xpress SARS-CoV-2/FLU/RSV plus assay is intended as an aid in the diagnosis of influenza from Nasopharyngeal swab specimens and should not be used as a sole basis for treatment. Nasal washings and aspirates are unacceptable for Xpert Xpress SARS-CoV-2/FLU/RSV testing.  Fact Sheet for Patients: BloggerCourse.com  Fact Sheet for Healthcare Providers: SeriousBroker.it  This test is not yet approved or cleared by the [place] FDA and has been authorized for detection and/or diagnosis of SARS-CoV-2 by FDA under an Emergency Use Authorization (EUA). This EUA will remain in effect (meaning this test can be used) for the duration of the COVID-19 declaration under Section 564(b)(1) of the Act, 21 U.S.C. section 360bbb-3(b)(1), unless the authorization is terminated or revoked.  Performed at [facility] [address] [place], [place] [address]   Blood Culture (routine x 2)     Status: None   Collection Time: 04/28/21  7:05 PM   Specimen: BLOOD  Result Value Ref Range Status   Specimen Description BLOOD  RT FOOT  Final   Special Requests BOTTLES DRAWN AEROBIC ONLY  Final   Culture   Final    NO GROWTH 5 DAYS Performed at [facility], [address] [place], [place] [address]    Report Status 05/03/2021 FINAL  Final  Blood Culture (routine x 2)     Status: None   Collection Time: 04/28/21  7:05 PM   Specimen: BLOOD  Result Value Ref Range Status   Specimen Description BLOOD  LFOA  Final    Special Requests   Final    BOTTLES DRAWN AEROBIC AND ANAEROBIC Blood Culture adequate volume   Culture   Final    NO GROWTH 5 DAYS Performed at [facility], [address], [place], [place] [address]    Report Status 05/03/2021 FINAL  Final  Urine Culture     Status: None   Collection Time: 04/28/21 10:17 PM   Specimen: Urine, Random  Result Value Ref Range Status   Specimen Description   Final    URINE, RANDOM Performed at [facility], [address], [place], [place] [address]    Special Requests   Final  NONE Performed at [facility], [address], [place], [place] [address]    Culture   Final    NO GROWTH Performed at [facility], [address] [place], [place] [address]    Report Status 04/29/2021 FINAL  Final  MRSA Next Gen by PCR, Nasal     Status: None   Collection Time: 04/29/21 12:55 AM   Specimen: Nasal Mucosa; Nasal Swab  Result Value Ref Range Status   MRSA by PCR Next Gen NOT DETECTED NOT DETECTED Final    Comment: (NOTE) The GeneXpert MRSA Assay (FDA approved for NASAL specimens only), is one component of a comprehensive MRSA colonization surveillance program. It is not intended to diagnose MRSA infection nor to guide or monitor treatment for MRSA infections. Test performance is not FDA approved in patients less than 35 years old. Performed at [facility], [address], [place], [place] [address]          Radiology Studies: No results found.      Scheduled Meds:  arformoterol  15 mcg Nebulization BID   atorvastatin  20 mg Oral QHS   budesonide (PULMICORT) nebulizer solution  0.5 mg Nebulization BID   buPROPion ER  100 mg Oral Daily   Chlorhexidine Gluconate Cloth  6 each Topical Daily   cholecalciferol  1,000 Units Oral Daily   cloZAPine  50 mg Oral QHS   dorzolamide-timolol  1 drop Both Eyes BID   enoxaparin (LOVENOX) injection  40 mg Subcutaneous Q24H   ezetimibe  10 mg Oral  QHS   feeding supplement  237 mL Oral TID BM   irbesartan  150 mg Oral Daily   isosorbide mononitrate  30 mg Oral Q12H   latanoprost  1 drop Both Eyes QHS   melatonin  5 mg Oral QHS   multivitamin with minerals  1 tablet Oral Daily   pantoprazole  40 mg Oral Daily   revefenacin  175 mcg Nebulization Daily   tamsulosin  0.4 mg Oral Daily   Continuous Infusions:  sodium chloride Stopped (05/04/21 1557)     LOS: 9 days    Time spent: 15 mins     [name], MD [facility] Pager [phone]  If 7PM-7AM, please contact night-coverage  05/07/2021, 7:41 AM

## 2021-05-08 ENCOUNTER — Inpatient Hospital Stay: Payer: Medicare Other

## 2021-05-08 DIAGNOSIS — G9341 Metabolic encephalopathy: Secondary | ICD-10-CM | POA: Diagnosis not present

## 2021-05-08 DIAGNOSIS — R471 Dysarthria and anarthria: Secondary | ICD-10-CM

## 2021-05-08 DIAGNOSIS — R531 Weakness: Secondary | ICD-10-CM | POA: Diagnosis not present

## 2021-05-08 DIAGNOSIS — K5900 Constipation, unspecified: Secondary | ICD-10-CM

## 2021-05-08 DIAGNOSIS — J9622 Acute and chronic respiratory failure with hypercapnia: Secondary | ICD-10-CM

## 2021-05-08 DIAGNOSIS — J9621 Acute and chronic respiratory failure with hypoxia: Secondary | ICD-10-CM | POA: Diagnosis not present

## 2021-05-08 LAB — CBC WITH DIFFERENTIAL/PLATELET
Abs Immature Granulocytes: 0.07 10*3/uL (ref 0.00–0.07)
Basophils Absolute: 0 10*3/uL (ref 0.0–0.1)
Basophils Relative: 1 %
Eosinophils Absolute: 0.1 10*3/uL (ref 0.0–0.5)
Eosinophils Relative: 2 %
HCT: 35.1 % — ABNORMAL LOW (ref 39.0–52.0)
Hemoglobin: 11.3 g/dL — ABNORMAL LOW (ref 13.0–17.0)
Immature Granulocytes: 1 %
Lymphocytes Relative: 25 %
Lymphs Abs: 1.6 10*3/uL (ref 0.7–4.0)
MCH: 28.7 pg (ref 26.0–34.0)
MCHC: 32.2 g/dL (ref 30.0–36.0)
MCV: 89.1 fL (ref 80.0–100.0)
Monocytes Absolute: 0.8 10*3/uL (ref 0.1–1.0)
Monocytes Relative: 13 %
Neutro Abs: 3.7 10*3/uL (ref 1.7–7.7)
Neutrophils Relative %: 58 %
Platelets: 283 10*3/uL (ref 150–400)
RBC: 3.94 MIL/uL — ABNORMAL LOW (ref 4.22–5.81)
RDW: 13.8 % (ref 11.5–15.5)
WBC: 6.3 10*3/uL (ref 4.0–10.5)
nRBC: 0 % (ref 0.0–0.2)

## 2021-05-08 LAB — BASIC METABOLIC PANEL
Anion gap: 9 (ref 5–15)
BUN: 9 mg/dL (ref 8–23)
CO2: 29 mmol/L (ref 22–32)
Calcium: 8.8 mg/dL — ABNORMAL LOW (ref 8.9–10.3)
Chloride: 94 mmol/L — ABNORMAL LOW (ref 98–111)
Creatinine, Ser: 0.43 mg/dL — ABNORMAL LOW (ref 0.61–1.24)
GFR, Estimated: >60 mL/min (ref >60)
Glucose, Bld: 87 mg/dL (ref 70–99)
Potassium: 3.9 mmol/L (ref 3.5–5.1)
Sodium: 132 mmol/L — ABNORMAL LOW (ref 135–145)

## 2021-05-08 LAB — GLUCOSE, CAPILLARY
Glucose-Capillary: 100 mg/dL — ABNORMAL HIGH (ref 70–99)
Glucose-Capillary: 88 mg/dL (ref 70–99)
Glucose-Capillary: 96 mg/dL (ref 70–99)
Glucose-Capillary: 124 mg/dL — ABNORMAL HIGH (ref 70–99)
Glucose-Capillary: 100 mg/dL — ABNORMAL HIGH (ref 70–99)
Glucose-Capillary: 106 mg/dL — ABNORMAL HIGH (ref 70–99)

## 2021-05-08 LAB — SARS CORONAVIRUS 2 (TAT 6-24 HRS): SARS Coronavirus 2: NEGATIVE

## 2021-05-08 LAB — PHOSPHORUS: Phosphorus: 4.8 mg/dL — ABNORMAL HIGH (ref 2.5–4.6)

## 2021-05-08 LAB — MAGNESIUM: Magnesium: 1.7 mg/dL (ref 1.7–2.4)

## 2021-05-08 MED ORDER — ASPIRIN 81 MG PO CHEW
81.0000 mg | CHEWABLE_TABLET | Freq: Every day | ORAL | Status: DC
  Administered 2021-05-08 – 2021-05-17 (×10): 81 mg via ORAL
  Filled 2021-05-08 (×10): qty 1

## 2021-05-08 MED ORDER — MAGNESIUM SULFATE 2 GM/50ML IV SOLN
2.0000 g | Freq: Once | INTRAVENOUS | Status: AC
  Administered 2021-05-08: 2 g via INTRAVENOUS
  Filled 2021-05-08: qty 50

## 2021-05-08 NOTE — Progress Notes (Signed)
Patient ID: Brandon [name], male   DOB: [birth year], 65 y.o.   MRN: [id number] [facility] PROGRESS NOTE  JODI KAPPES NAT:[id number] DOB: [birth year] DOA: 04/28/2021 PCP: Sherron Monday, MD  HPI/Subjective: Patient unable to talk.  Trying to mouth the words but unable to hear them.  Brother concerned that he is unable to write.  Mother states the patient has been unable to talk the entire hospital stay patient unable to lift his right leg up off the bed.  Admitted 9 days ago with acute hypoxic respiratory failure and hyponatremia.  Objective: Vitals:   05/08/21 0817 05/08/21 1143  BP: 113/74 111/85  Pulse: 74 95  Resp: 18 16  Temp: 98 F (36.7 C) 98.4 F (36.9 C)  SpO2: 94% 94%    Intake/Output Summary (Last 24 hours) at 05/08/2021 1632 Last data filed at 05/08/2021 1148 Gross per 24 hour  Intake 290 ml  Output 2425 ml  Net -2135 ml   Filed Weights   05/05/21 0000 05/05/21 0443 05/08/21 0348  Weight: 78.3 kg 75.7 kg 73.7 kg    ROS: Review of Systems  Unable to perform ROS: Acuity of condition  Exam: Physical Exam HENT:     Head: Normocephalic.     Mouth/Throat:     Pharynx: No oropharyngeal exudate.  Eyes:     General: Lids are normal.     Conjunctiva/sclera: Conjunctivae normal.  Cardiovascular:     Rate and Rhythm: Normal rate and regular rhythm.     Heart sounds: Normal heart sounds, S1 normal and S2 normal.  Pulmonary:     Breath sounds: Examination of the right-lower field reveals decreased breath sounds. Examination of the left-lower field reveals decreased breath sounds. Decreased breath sounds present. No wheezing, rhonchi or rales.  Abdominal:     Palpations: Abdomen is soft.     Tenderness: There is no abdominal tenderness.  Musculoskeletal:     Right lower leg: No swelling.     Left lower leg: No swelling.  Skin:    General: Skin is warm.     Findings: No rash.  Neurological:     Mental Status: He is lethargic.     Comments: Right leg  able to move toes but unable to straight leg raise.  Right arm able to move it but power 4 out of 5 in intensity      Scheduled Meds:  arformoterol  15 mcg Nebulization BID   atorvastatin  20 mg Oral QHS   budesonide (PULMICORT) nebulizer solution  0.5 mg Nebulization BID   buPROPion ER  100 mg Oral Daily   Chlorhexidine Gluconate Cloth  6 each Topical Daily   cholecalciferol  1,000 Units Oral Daily   cloZAPine  50 mg Oral QHS   docusate sodium  200 mg Oral BID   dorzolamide-timolol  1 drop Both Eyes BID   enoxaparin (LOVENOX) injection  40 mg Subcutaneous Q24H   ezetimibe  10 mg Oral QHS   feeding supplement  237 mL Oral TID BM   irbesartan  150 mg Oral Daily   isosorbide mononitrate  30 mg Oral Q12H   latanoprost  1 drop Both Eyes QHS   melatonin  5 mg Oral QHS   multivitamin with minerals  1 tablet Oral Daily   pantoprazole  40 mg Oral Daily   revefenacin  175 mcg Nebulization Daily   tamsulosin  0.4 mg Oral Daily   Continuous Infusions:  sodium chloride Stopped (05/04/21 1557)    Assessment/Plan:  Dysarthria with right-sided weakness.  MRI of the brain to rule out stroke or central pontine myelinolysis.  Chewable aspirin. Acute metabolic encephalopathy with history of schizophrenia patient on clozapine and Wellbutrin Acute on chronic hypoxic hypercapnic respiratory failure, history of COPD.  Continue nebulizer treatments. Constipation.  Continue medications Orthostatic hypotension resolved Acute on chronic hyponatremia.  Initial sodium 122.  Sodium 132. Hypomagnesemia.  Give IV magnesium today. BPH on Flomax Weakness.  Physical therapy recommending rehab        Code Status:     Code Status Orders  (From admission, onward)           Start     Ordered   05/03/21 1533  Do not attempt resuscitation (DNR)  Continuous       Question Answer Comment  In the event of cardiac or respiratory ARREST Do not call a "code blue"   In the event of cardiac or  respiratory ARREST Do not perform Intubation, CPR, defibrillation or ACLS   In the event of cardiac or respiratory ARREST Use medication by any route, position, wound care, and other measures to relive pain and suffering. May use oxygen, suction and manual treatment of airway obstruction as needed for comfort.      05/03/21 1533           Code Status History     Date Active Date Inactive Code Status Order ID Comments User Context   04/28/2021 2115 05/03/2021 1533 Full Code [id number]  Rust-Chester, Cecelia Byars, NP ED   04/12/2021 2216 04/17/2021 1709 Full Code [id number]  Cox, Nadyne Coombes, DO ED   04/03/2021 2041 04/12/2021 0143 Full Code [id number]  Mansy, Vernetta Honey, MD ED   03/22/2021 1851 03/25/2021 2231 Full Code [id number]  Anselm Jungling, DO ED   11/27/2020 2331 12/03/2020 2155 Full Code [id number]  Acheampong, Genice Rouge, MD ED   07/17/2018 1249 07/20/2018 1726 Full Code [id number]  Sung Amabile, DO Inpatient   05/04/2018 2029 05/07/2018 1634 Full Code [id number]  Sung Amabile, DO Inpatient   04/08/2018 2011 04/12/2018 1441 Full Code [id number]  Sung Amabile, DO Inpatient   10/19/2017 1252 10/19/2017 1820 Full Code [id number]  Laurier Nancy, MD Inpatient      Family Communication: Spoke with brother on the phone Disposition Plan: Status is: Inpatient  Dispo: The patient is from: Rehab              Anticipated d/c is to: Rehab              Patient currently has had a large decline since I saw him last.  The inability to speak is new.  Right-sided weakness is new   Difficult to place patient.  No.  Time spent: 28 minutes  Susan [employer]

## 2021-05-08 NOTE — Progress Notes (Addendum)
Speech Language Pathology Treatment: Dysphagia  Patient Details Name: Brandon [name] MRN: [id number] DOB: [birth year] Today's Date: 05/08/2021 Time: 1410-1440 SLP Time Calculation (min) (ACUTE ONLY): 30 min  Assessment / Plan / Recommendation Clinical Impression  Pt seen for ongoing assessment of toleration of dysphagia diet; need for further modification of diet. Pt is currently on a dysphagia level 2 (minced foods) w/ thin liquids w/ aspiration precautions. WBC normal; afebrile. Remains on 3L O2 support.   Pt presents w/ declined verbal communication; poor onset of voicing -- this has declined since initial eval post extubation in which he initially had vocal onset/phonations though min low volume. Unsure if this is related to pt's recent oral intubations or to his Mental/Cognitive status decline during this illness; (?) stroke; (?) encephalopathy. Noted pt's Baseline dxs including Psychiatric illness and query for Dementia per MD note. Pt also has significant h/o ETOH abuse which may have impacted Cognition. Pt is able to head nod for yes/no questions but requires verbal cues to be specific; follows 1-step commands w/ cues.  Pt appeared to adequately tolerate po trials w/ no immediate, overt clinical s/s of aspiration; no decline in respiratory status post trials. Pt helped to feed self.   Recommend continues current dysphagia diet w/ aspiration precautions; Pills in Puree for safer swallowing. ST services will continue to f/u for toleration of diet; education on aspiration precautions. Recommend an ENT consult for r/o of any vocal cord dysfunction impacting his ability to voice/phonate. Recommend f/u w/ Neurology for formal assessment of Cognition, stroke; an informal assessment of Cognition can be done while admitted but could be significantly impacted by pt's lack of verbal communication. Will consult MD re: pt's overall status.      HPI HPI: Pt is a 65 y.o. male with   COPD, CAD,  hypertension, hyperlipidemia, schizophrenia, schizoaffective disorder bipolar 1, generalized weakness, debility, query early Dementia, seizure, Alcohol Abuse, MI 2005, history of suicidal attempt in the past admitted on 04/28/2021 for Acute respiratory failure,  Hyponatremia, Acute respiratory failure with hypoxia, Unresponsiveness, Acute metabolic encephalopathy.  Psychiatry is following. Recent admits to the ED/hospital in recent month.  During this admit so far, pt has had 2 brief oral intubations lasting ~1 day each; w/ BiPAP.  He self-extubated this morning at the time of this eval and is on [place] O2 support of 3L.   Head CT: No acute intracranial abnormalities.  Mild cerebral atrophy.   CXR: left basilar opacities.      SLP Plan  Continue with current plan of care      Recommendations for follow up therapy are one component of a multi-disciplinary discharge planning process, led by the attending physician.  Recommendations may be updated based on patient status, additional functional criteria and insurance authorization.    Recommendations  Diet recommendations: Dysphagia 2 (fine chop);Thin liquid Liquids provided via: Cup;Straw Medication Administration: Whole meds with puree (for safer swallowing) Supervision: Patient able to self feed;Staff to assist with self feeding;Intermittent supervision to cue for compensatory strategies Compensations: Minimize environmental distractions;Slow rate;Small sips/bites;Lingual sweep for clearance of pocketing;Multiple dry swallows after each bite/sip;Follow solids with liquid (rest breaks) Postural Changes and/or Swallow Maneuvers: Out of bed for meals;Seated upright 90 degrees;Upright 30-60 min after meal                General recommendations:  (Dietician f/u) Oral Care Recommendations: Oral care BID;Oral care before and after PO;Staff/trained caregiver to provide oral care Follow up Recommendations:  (TBD) SLP Visit Diagnosis: Dysphagia,  oropharyngeal phase (R13.12) Plan: Continue with current plan of care       GO                 Jerilynn Som, MS, CCC-SLP Speech Language Pathologist Rehab Services [phone] [facility] 05/08/2021, 4:04 PM

## 2021-05-08 NOTE — Progress Notes (Signed)
Physical Therapy Treatment Patient Details Name: Brandon [name] MRN: [id number] DOB: September 10, 1955 Today's Date: 05/08/2021   History of Present Illness Pt is a 65 year old male with PMH including schizophrenia, seizures (ETOH withdrawal related), and ETOH abuse presenting to [facility] ED on 04/28/2021 from [facility] via EMS.  Staff at [facility] called EMS after patient was found to be unresponsive and reportedly hypotensive in the 50s for an unknown amount of time.  At [facility] patient became hypoxic with SPO2 in the 70s and was too confused to safely tolerate BiPAP requiring emergent intubation and mechanical ventilatory support. Pt was eventully extubated but needed to be reintubated 9/20 and was again extubated 9/21.  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    PT Comments    Pt continued to have difficulty with verbal communication but was able to head-nod to yes/no questions and to follow 1-step commands fairly consistently with extra time and cuing.  Pt put forth good effort during the session but continued to present with significant functional weakness.  Pt required near total assist with bed mobility tasks and once in sitting presented with posterior and L lateral instability that improved somewhat with weight shifting activities and general balance training.  Pt stood 3 times from an elevated EOB with Mod A initially but by the third stand required heavy +2 mod A.  Pt stood with BLE knees flexed that improved only minimally with cues.  Pt made attempts to advance a LE during each standing session but was never able to do so. Pt will benefit from PT services in a SNF setting upon discharge to  safely address deficits listed in patient problem list for decreased caregiver assistance and eventual return to PLOF.     Recommendations for follow up therapy are one component of a multi-disciplinary discharge planning process, led by the attending physician.  Recommendations may be updated based on patient status, additional functional criteria and insurance authorization.  Follow Up Recommendations  SNF;Supervision/Assistance - 24 hour     Equipment Recommendations  Other (comment) (TBD)    Recommendations for Other Services       Precautions / Restrictions Precautions Precautions: Fall Restrictions Weight Bearing Restrictions: No     Mobility  Bed Mobility Overal bed mobility: Needs Assistance Bed Mobility: Sit to Supine;Supine to Sit;Rolling Rolling: Max assist   Supine to sit: Max assist;HOB elevated;+2 for physical assistance Sit to supine: +2 for physical assistance;Max assist   General bed mobility comments: +2 Max A to manage trunk and BLEs    Transfers Overall transfer level: Needs assistance Equipment used: Rolling walker (2 wheeled)   Sit to Stand: +2 physical assistance;From elevated surface;Mod assist            Ambulation/Gait             General Gait Details: Unable to advance either LE in standing   Stairs             Wheelchair Mobility    Modified [name] (Stroke Patients Only)       Balance Overall balance assessment: Needs assistance Sitting-balance support: Bilateral upper extremity supported;Feet supported Sitting balance-[name] Scale: Poor Sitting balance - Comments: Occasional min A for static sitting balance Postural control: Posterior lean;Left lateral lean Standing balance support: Bilateral upper  extremity supported;During functional activity Standing balance-[name] Scale: Poor Standing balance comment: Mod A for static standing balance                            Cognition Arousal/Alertness:  Awake/alert Behavior During Therapy: WFL for tasks assessed/performed Overall Cognitive Status: Difficult to assess                                        Exercises Other Exercises Other Exercises: Dynamic sitting balance training with lateral and anterior weight shifting activities Other Exercises: Static standing 3 x 15-30 sec for improved functional strength and activity tolerance    General Comments        Pertinent Vitals/Pain Pain Assessment: Faces Pain Score: 2  Pain Location: Bottom Pain Descriptors / Indicators: Grimacing Pain Intervention(s): Repositioned;Monitored during session    Home Living                      Prior Function            PT Goals (current goals can now be found in the care plan section) Progress towards PT goals: Progressing toward goals    Frequency    Min 2X/week      PT Plan Current plan remains appropriate    Co-evaluation              AM-PAC PT "6 Clicks" Mobility   Outcome Measure  Help needed turning from your back to your side while in a flat bed without using bedrails?: A Lot Help needed moving from lying on your back to sitting on the side of a flat bed without using bedrails?: Total Help needed moving to and from a bed to a chair (including a wheelchair)?: Total Help needed standing up from a chair using your arms (e.g., wheelchair or bedside chair)?: Total Help needed to walk in hospital room?: Total Help needed climbing 3-5 steps with a railing? : Total 6 Click Score: 7    End of Session Equipment Utilized During Treatment: Gait belt Activity Tolerance: Patient tolerated treatment well Patient left: in bed;with call bell/phone within reach;with bed alarm set Nurse Communication: Mobility status;Other (comment) (Pt's suction machine not functioning properly) PT Visit Diagnosis: Unsteadiness on feet (R26.81);Muscle weakness (generalized) (M62.81);Difficulty in walking, not elsewhere  classified (R26.2)     Time: 1610-9604 PT Time Calculation (min) (ACUTE ONLY): 27 min  Charges:  $Therapeutic Exercise: 8-22 mins $Therapeutic Activity: 8-22 mins                     D. Scott Shrita Thien PT, DPT 05/08/21, 3:42 PM

## 2021-05-08 NOTE — Progress Notes (Signed)
Occupational Therapy Treatment Patient Details Name: Brandon [name] MRN: [id number] DOB: 30-Mar-1956 Today's Date: 05/08/2021   History of present illness Pt is a 65 year old male with PMH including schizophrenia, seizures (ETOH withdrawal related), and ETOH abuse presenting to [facility] ED on 04/28/2021 from [facility] via EMS.  Staff at [facility] called EMS after patient was found to be unresponsive and reportedly hypotensive in the 50s for an unknown amount of time.  At [facility] patient became hypoxic with SPO2 in the 70s and was too confused to safely tolerate BiPAP requiring emergent intubation and mechanical ventilatory support. Pt was eventully extubated but needed to be reintubated 9/20 and was again extubated 9/21.  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   OT comments  Brandon [name] was seen for OT treatment on this date. Upon arrival to room pt reclined in bed, recently completed PT but agreeable to session. Pt requires MAX A for LBD at bed level. MIN A for toothbrushing seated EOB - assist for sitting balance. MIN A for don/doff gown seated EOB. Pt tolerated ~25 min dynamic/static sitting at EOB including use of commnication board - internittent CGA for static sitting decreasing to MIN A for dynamic tasks. Pt tolerated X4 standing trials c MAX A x2 + RW, unable to achieve fully upright posture. Pt completed seated therex as described below. Pt making good progress toward goals. Pt continues to benefit from skilled OT services to maximize return to PLOF and minimize risk of future falls, injury, caregiver burden, and readmission. Will continue to follow POC. Discharge recommendation remains  appropriate.     Recommendations for follow up therapy are one component of a multi-disciplinary discharge planning process, led by the attending physician.  Recommendations may be updated based on patient status, additional functional criteria and insurance authorization.    Follow Up Recommendations  SNF;Supervision/Assistance - 24 hour    Equipment Recommendations  Other (comment) (defer)    Recommendations for Other Services      Precautions / Restrictions Precautions Precautions: Fall Restrictions Weight Bearing Restrictions: No       Mobility Bed Mobility Overal bed mobility: Needs Assistance Bed Mobility: Sit to Supine;Supine to Sit;Rolling Rolling: Max assist   Supine to sit: Mod assist;+2 for physical assistance Sit to supine: +2 for physical assistance;Max assist   General bed mobility comments: +2 Max A to manage trunk and BLEs    Transfers Overall transfer level: Needs assistance Equipment used: Rolling walker (2 wheeled) Transfers: Sit to/from Stand Sit to Stand: Max assist;+2 physical assistance;From elevated surface         General transfer comment: tolerated 4 standing trials    Balance Overall balance assessment: Needs assistance Sitting-balance support: No upper extremity supported;Feet supported Sitting balance-[name] Scale: Poor Sitting balance - Comments: Occasional min A for static sitting balance Postural control: Posterior lean;Left lateral lean Standing balance support: Bilateral upper extremity supported Standing balance-[name] Scale: Zero Standing balance comment: Mod A for static standing balance                           ADL either performed or assessed with clinical judgement   ADL Overall ADL's : Needs assistance/impaired  General ADL Comments: MAX A for LBD at bed level. MIN A for toothbrushing seated EOB - assist for sitting balance. MIN A for don/doff gown  seated EOB. Pt tolerated ~25 min dynamic/static sitting at EOB including use of commnication board - internittent CGA for static sitting decreasing to MIN A for dynamic tasks.      Cognition Arousal/Alertness: Awake/alert Behavior During Therapy: WFL for tasks assessed/performed Overall Cognitive Status: Within Functional Limits for tasks assessed                                 General Comments: Pt communicated with AAC and ABC board 2/2 hypophonia        Exercises Exercises: General Upper Extremity;Other exercises General Exercises - Upper Extremity Shoulder Flexion: AROM;Strengthening;Both;10 reps;Seated Shoulder Extension: AROM;Strengthening;Both;10 reps;Seated Shoulder ABduction: AROM;Strengthening;Both;10 reps;Seated Shoulder ADduction: AROM;Strengthening;Both;10 reps;Seated Elbow Flexion: AROM;Strengthening;Both;10 reps;Seated Elbow Extension: AROM;Strengthening;Both;10 reps;Seated Other Exercises Other Exercises: Pt educated re: OT role, POC, HEP, falls prevention. IS, ECS Other Exercises: Grooming, UBD, LBD, sup<>sit, sitting/standing balance/tolerance, rolling, sit<>stand x4,           Pertinent Vitals/ Pain       Pain Assessment: No/denies pain Faces Pain Scale: Hurts a little bit Pain Location: Bottom Pain Descriptors / Indicators: Grimacing Pain Intervention(s): Repositioned;Monitored during session;Relaxation   Frequency  Min 2X/week        Progress Toward Goals  OT Goals(current goals can now be found in the care plan section)  Progress towards OT goals: Progressing toward goals  Acute Rehab OT Goals Patient Stated Goal: to have more therapy OT Goal Formulation: With patient Time For Goal Achievement: 05/18/21 Potential to Achieve Goals: Good ADL Goals Pt Will Perform Grooming: with min assist;sitting Pt Will Perform Lower Body Dressing: with mod assist;with max assist;sitting/lateral leans;with adaptive equipment Pt Will Transfer  to Toilet: squat pivot transfer;with max assist;bedside commode Pt Will Perform Toileting - Clothing Manipulation and hygiene: with mod assist;with max assist;sitting/lateral leans Pt/caregiver will Perform Home Exercise Program: Increased strength;Both right and left upper extremity;With minimal assist Additional ADL Goal #1: Pt will demo F static sitting balance with UE support for 3 minutes EOB to improve trunk strength.  Plan Discharge plan remains appropriate;Frequency remains appropriate    Co-evaluation                 AM-PAC OT "6 Clicks" Daily Activity     Outcome Measure   Help from another person eating meals?: A Little Help from another person taking care of personal grooming?: A Little Help from another person toileting, which includes using toliet, bedpan, or urinal?: A Lot Help from another person bathing (including washing, rinsing, drying)?: A Lot Help from another person to put on and taking off regular upper body clothing?: A Little Help from another person to put on and taking off regular lower body clothing?: A Lot 6 Click Score: 15    End of Session Equipment Utilized During Treatment: Oxygen;Rolling walker  OT Visit Diagnosis: Unsteadiness on feet (R26.81)   Activity Tolerance Patient tolerated treatment well   Patient Left in bed;with call bell/phone within reach;with bed alarm set   Nurse Communication Mobility status        Time: 7858-8502 OT Time Calculation (min): 55 min  Charges: OT General Charges $OT Visit: 1 Visit OT Treatments $Self Care/Home Management : 23-37 mins $Therapeutic Activity: 8-22 mins $Therapeutic Exercise: 8-22 mins  Kathie Dike, M.S. OTR/L  05/08/21,  4:19 PM  ascom [phone]

## 2021-05-09 ENCOUNTER — Inpatient Hospital Stay: Payer: Medicare Other

## 2021-05-09 DIAGNOSIS — Z515 Encounter for palliative care: Secondary | ICD-10-CM | POA: Diagnosis not present

## 2021-05-09 DIAGNOSIS — R29898 Other symptoms and signs involving the musculoskeletal system: Secondary | ICD-10-CM | POA: Diagnosis not present

## 2021-05-09 DIAGNOSIS — Z7189 Other specified counseling: Secondary | ICD-10-CM | POA: Diagnosis not present

## 2021-05-09 DIAGNOSIS — R471 Dysarthria and anarthria: Secondary | ICD-10-CM | POA: Diagnosis not present

## 2021-05-09 DIAGNOSIS — J9621 Acute and chronic respiratory failure with hypoxia: Secondary | ICD-10-CM | POA: Diagnosis not present

## 2021-05-09 DIAGNOSIS — R5381 Other malaise: Secondary | ICD-10-CM

## 2021-05-09 DIAGNOSIS — K5900 Constipation, unspecified: Secondary | ICD-10-CM | POA: Diagnosis not present

## 2021-05-09 LAB — LIPID PANEL
Cholesterol: 103 mg/dL (ref 0–200)
HDL: 40 mg/dL — ABNORMAL LOW (ref >40)
LDL Cholesterol: 56 mg/dL (ref 0–99)
Total CHOL/HDL Ratio: 2.6 RATIO
Triglycerides: 35 mg/dL (ref <150)
VLDL: 7 mg/dL (ref 0–40)

## 2021-05-09 LAB — GLUCOSE, CAPILLARY
Glucose-Capillary: 102 mg/dL — ABNORMAL HIGH (ref 70–99)
Glucose-Capillary: 108 mg/dL — ABNORMAL HIGH (ref 70–99)
Glucose-Capillary: 114 mg/dL — ABNORMAL HIGH (ref 70–99)
Glucose-Capillary: 91 mg/dL (ref 70–99)
Glucose-Capillary: 92 mg/dL (ref 70–99)
Glucose-Capillary: 99 mg/dL (ref 70–99)

## 2021-05-09 LAB — BLOOD GAS, ARTERIAL
Acid-Base Excess: 4.4 mmol/L — ABNORMAL HIGH (ref 0.0–2.0)
Allens test (pass/fail): POSITIVE — AB
Bicarbonate: 30.3 mmol/L — ABNORMAL HIGH (ref 20.0–28.0)
FIO2: 0.28
O2 Saturation: 95.4 %
Patient temperature: 37
pCO2 arterial: 50 mmHg — ABNORMAL HIGH (ref 32.0–48.0)
pH, Arterial: 7.39 (ref 7.350–7.450)
pO2, Arterial: 79 mmHg — ABNORMAL LOW (ref 83.0–108.0)

## 2021-05-09 LAB — BASIC METABOLIC PANEL
Anion gap: 8 (ref 5–15)
BUN: 10 mg/dL (ref 8–23)
CO2: 30 mmol/L (ref 22–32)
Calcium: 8.9 mg/dL (ref 8.9–10.3)
Chloride: 96 mmol/L — ABNORMAL LOW (ref 98–111)
Creatinine, Ser: 0.59 mg/dL — ABNORMAL LOW (ref 0.61–1.24)
GFR, Estimated: >60 mL/min (ref >60)
Glucose, Bld: 96 mg/dL (ref 70–99)
Potassium: 4 mmol/L (ref 3.5–5.1)
Sodium: 134 mmol/L — ABNORMAL LOW (ref 135–145)

## 2021-05-09 MED ORDER — IRBESARTAN 150 MG PO TABS
75.0000 mg | ORAL_TABLET | Freq: Every day | ORAL | Status: DC
  Administered 2021-05-09: 75 mg via ORAL
  Filled 2021-05-09: qty 1

## 2021-05-09 MED ORDER — ISOSORBIDE MONONITRATE ER 30 MG PO TB24
30.0000 mg | ORAL_TABLET | Freq: Every day | ORAL | Status: DC
  Administered 2021-05-10 – 2021-05-17 (×8): 30 mg via ORAL
  Filled 2021-05-09 (×8): qty 1

## 2021-05-09 MED ORDER — CLOZAPINE 25 MG PO TABS
75.0000 mg | ORAL_TABLET | Freq: Every day | ORAL | Status: DC
  Administered 2021-05-09 – 2021-05-16 (×8): 75 mg via ORAL
  Filled 2021-05-09 (×10): qty 3

## 2021-05-09 NOTE — Progress Notes (Signed)
Patient ID: Brandon [name], male   DOB: 06-23-1956, 65 y.o.   MRN: [id number] [facility] PROGRESS NOTE  Brandon [name] ZDG:[id number] DOB: 1955-12-22 DOA: 04/28/2021 PCP: Sherron Monday, MD  HPI/Subjective: Patient still unable to talk.  Able to shake his head yes or no to some questions.  Patient admitted 10 days ago with acute hypoxic respiratory failure hyponatremia and hypotension.  Objective: Vitals:   05/09/21 1150 05/09/21 1525  BP: 116/76 104/77  Pulse: 90 96  Resp: 16 17  Temp: 97.9 F (36.6 C) 98.3 F (36.8 C)  SpO2: 95% 100%    Intake/Output Summary (Last 24 hours) at 05/09/2021 1616 Last data filed at 05/09/2021 1527 Gross per 24 hour  Intake 600 ml  Output 3500 ml  Net -2900 ml   Filed Weights   05/05/21 0443 05/08/21 0348 05/09/21 0416  Weight: 75.7 kg 73.7 kg 73.9 kg    ROS: Review of Systems  Respiratory:  Negative for shortness of breath.   Cardiovascular:  Negative for chest pain.  Gastrointestinal:  Negative for abdominal pain.  Exam: Physical Exam HENT:     Head: Normocephalic.     Mouth/Throat:     Pharynx: No oropharyngeal exudate.  Eyes:     General: Lids are normal.     Conjunctiva/sclera: Conjunctivae normal.  Cardiovascular:     Rate and Rhythm: Normal rate and regular rhythm.     Heart sounds: Normal heart sounds, S1 normal and S2 normal.  Pulmonary:     Breath sounds: Examination of the right-lower field reveals decreased breath sounds. Examination of the left-lower field reveals decreased breath sounds. Decreased breath sounds present. No wheezing, rhonchi or rales.  Abdominal:     Palpations: Abdomen is soft.     Tenderness: There is no abdominal tenderness.  Musculoskeletal:     Right lower leg: No swelling.     Left lower leg: No swelling.  Skin:    General: Skin is warm.     Findings: No rash.  Neurological:     Mental Status: He is alert.     Comments: Unable to lift that right leg up off the bed.  Able to move  his arms.      Scheduled Meds:  arformoterol  15 mcg Nebulization BID   aspirin  81 mg Oral Daily   atorvastatin  20 mg Oral QHS   budesonide (PULMICORT) nebulizer solution  0.5 mg Nebulization BID   buPROPion ER  100 mg Oral Daily   Chlorhexidine Gluconate Cloth  6 each Topical Daily   cholecalciferol  1,000 Units Oral Daily   cloZAPine  75 mg Oral QHS   docusate sodium  200 mg Oral BID   dorzolamide-timolol  1 drop Both Eyes BID   enoxaparin (LOVENOX) injection  40 mg Subcutaneous Q24H   ezetimibe  10 mg Oral QHS   feeding supplement  237 mL Oral TID BM   irbesartan  75 mg Oral Daily   isosorbide mononitrate  30 mg Oral Q12H   latanoprost  1 drop Both Eyes QHS   melatonin  5 mg Oral QHS   multivitamin with minerals  1 tablet Oral Daily   pantoprazole  40 mg Oral Daily   revefenacin  175 mcg Nebulization Daily   tamsulosin  0.4 mg Oral Daily   Continuous Infusions:  sodium chloride Stopped (05/04/21 1557)    Assessment/Plan:  Dysarthria with right leg weakness.  MRI showing flair hyperintensity in the putamen bilaterally.  May correlate  to recent ischemia and hypotensive episode or hypoxic episode.  Started on aspirin.  I consulted ENT to look at the vocal cords but when Dr. Andee Poles arrived the patient refused procedure. Acute metabolic encephalopathy with history of schizophrenia.  Psychiatry to increase his clozapine dose.  Patient on Wellbutrin. Acute on chronic hypoxic hypercapnic respiratory failure.  History of COPD.  Continue nebulizer treatments. Constipation.  Continue anticonstipation medications Orthostatic hypotension has resolved.  I will discontinue Avapro.  Rather have blood pressure being on the higher side. Acute on chronic hyponatremia.  Initial sodium 122.  Most recent sodium 134. BPH on Flomax Weakness.  Physical therapy recommending rehab.  Transitional care team still looking into options.    Code Status:     Code Status Orders  (From admission,  onward)           Start     Ordered   05/03/21 1533  Do not attempt resuscitation (DNR)  Continuous       Question Answer Comment  In the event of cardiac or respiratory ARREST Do not call a "code blue"   In the event of cardiac or respiratory ARREST Do not perform Intubation, CPR, defibrillation or ACLS   In the event of cardiac or respiratory ARREST Use medication by any route, position, wound care, and other measures to relive pain and suffering. May use oxygen, suction and manual treatment of airway obstruction as needed for comfort.      05/03/21 1533           Code Status History     Date Active Date Inactive Code Status Order ID Comments User Context   04/28/2021 2115 05/03/2021 1533 Full Code [id number]  Rust-Chester, Cecelia Byars, NP ED   04/12/2021 2216 04/17/2021 1709 Full Code [id number]  Cox, Nadyne Coombes, DO ED   04/03/2021 2041 04/12/2021 0143 Full Code [id number]  Mansy, Vernetta Honey, MD ED   03/22/2021 1851 03/25/2021 2231 Full Code [id number]  Anselm Jungling, DO ED   11/27/2020 2331 12/03/2020 2155 Full Code [id number]  Acheampong, Genice Rouge, MD ED   07/17/2018 1249 07/20/2018 1726 Full Code [id number]  Sung Amabile, DO Inpatient   05/04/2018 2029 05/07/2018 1634 Full Code [id number]  Sung Amabile, DO Inpatient   04/08/2018 2011 04/12/2018 1441 Full Code [id number]  Sung Amabile, DO Inpatient   10/19/2017 1252 10/19/2017 1820 Full Code [id number]  Laurier Nancy, MD Inpatient      Family Communication: Spoke with brother at the bedside Disposition Plan: Status is: Inpatient  Dispo: The patient is from: Assisted living              Anticipated d/c is to: Rehab              Patient currently still awaiting rehab bed offers and hopefully will be able to get out to a rehab bed once and offer is made.   Difficult to place patient.  Yes.  Time spent: 27 minutes, case discussed with ENT  Alford Highland  [facility]

## 2021-05-09 NOTE — Progress Notes (Signed)
Physical Therapy Treatment Patient Details Name: Brandon [name] MRN: [id number] DOB: 1956-07-29 Today's Date: 05/09/2021   History of Present Illness Pt is a 65 year old male with PMH including schizophrenia, seizures (ETOH withdrawal related), and ETOH abuse presenting to [facility] ED on 04/28/2021 from [facility] via EMS.  Staff at [facility] called EMS after patient was found to be unresponsive and reportedly hypotensive in the 50s for an unknown amount of time.  At [facility] patient became hypoxic with SPO2 in the 70s and was too confused to safely tolerate BiPAP requiring emergent intubation and mechanical ventilatory support. Pt was eventully extubated but needed to be reintubated 9/20 and was again extubated 9/21.  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    PT Comments    Pt was pleasant and put forth fair effort during session. Pt presented with continued inability to communicate verbally but followed 1-step commands with extra cuing.  Due to time constraints and arrival of pt's meal session limited to supine therex only.  Pt with noted asymmetrical weakness in the RLE compared to the LLE during exercises although both LE's were grossly very weak throughout. Pt will benefit from PT services in a SNF setting upon discharge to safely address deficits listed in patient problem list for decreased caregiver assistance and eventual return to PLOF.   Recommendations for follow up therapy are one component of a multi-disciplinary discharge planning process, led by the attending physician.  Recommendations may be updated based on patient status, additional functional criteria and insurance  authorization.  Follow Up Recommendations  SNF;Supervision/Assistance - 24 hour     Equipment Recommendations  Other (comment) (TBD)    Recommendations for Other Services       Precautions / Restrictions Precautions Precautions: Fall Restrictions Weight Bearing Restrictions: No     Mobility  Bed Mobility               General bed mobility comments: NT, supine therex only secondary to time constraints and arrival of meal    Transfers                    Ambulation/Gait                 Stairs             Wheelchair Mobility    Modified [name] (Stroke Patients Only)       Balance                                            Cognition Arousal/Alertness: Awake/alert Behavior During Therapy: WFL for tasks assessed/performed Overall Cognitive Status: Difficult to assess                                        Exercises Total Joint Exercises Ankle Circles/Pumps: AROM;Strengthening;Both;10 reps;5 reps (with manual resistance) Quad Sets: Strengthening;Both;5 reps;10 reps Short Arc Quad: AROM;AAROM;Strengthening;Both;5 reps;10 reps Heel Slides: AAROM;Strengthening;Both;AROM;10 reps Hip ABduction/ADduction: Both;Strengthening;AAROM;10 reps;AROM Straight Leg Raises: AAROM;Strengthening;Both;10 reps Other Exercises Other Exercises: Supine leg press with manual resistance x 10  General Comments        Pertinent Vitals/Pain Pain Assessment: No/denies pain    Home Living                      Prior Function            PT Goals (current goals can now be found in the care plan section) Progress towards PT goals: PT to reassess next treatment    Frequency    Min 2X/week      PT Plan Current plan remains appropriate    Co-evaluation              AM-PAC PT "6 Clicks" Mobility   Outcome Measure  Help needed turning from your back to your side while in a flat bed without using  bedrails?: A Lot Help needed moving from lying on your back to sitting on the side of a flat bed without using bedrails?: Total Help needed moving to and from a bed to a chair (including a wheelchair)?: Total Help needed standing up from a chair using your arms (e.g., wheelchair or bedside chair)?: Total Help needed to walk in hospital room?: Total Help needed climbing 3-5 steps with a railing? : Total 6 Click Score: 7    End of Session   Activity Tolerance: Patient tolerated treatment well Patient left: in bed;with call bell/phone within reach;with bed alarm set Nurse Communication: Mobility status PT Visit Diagnosis: Unsteadiness on feet (R26.81);Muscle weakness (generalized) (M62.81);Difficulty in walking, not elsewhere classified (R26.2)     Time: 4734-0370 PT Time Calculation (min) (ACUTE ONLY): 14 min  Charges:  $Therapeutic Exercise: 8-22 mins                    D. Elly Modena PT, DPT 05/09/21, 5:24 PM

## 2021-05-09 NOTE — Consult Note (Signed)
..Brandon [name], Brandon [name] [id number] 1956/08/02 Alford Highland, MD  Reason for Consult: dysphonia  HPI: 65 year old male with history of schizoaffective disorder recently with episode of hypotension and 2 recent intubations.  Patient with dysphonia and dysphagia.  Per chart review, patient's family reports the dysphonia has worsened over the course of his hospitalization.  Patient with extensive psychiatric history.  Patient also with right arm weakness.  MRI was performed that shows some changes associated with hypotension but no frank CVA.   Allergies:  Allergies  Allergen Reactions   Compazine [Prochlorperazine] Other (See Comments)    Dystonic rxn - convulsions. Near fatal reaction.    Ivp Dye [Iodinated Diagnostic Agents] Shortness Of Breath    Pt states SOB after last IV contrast injection   Alcohol-Sulfur [Elemental Sulfur] Other (See Comments)    History of alcoholism   Benadryl [Diphenhydramine] Other (See Comments)    "stuffy", nasal congestion   Depakote [Divalproex Sodium] Other (See Comments)    Cause elevated ammonia   Dramamine [Dimenhydrinate] Swelling   Plavix [Clopidogrel] Other (See Comments)    Rectal bleeding. "Perforated my intestines."   Rosuvastatin     Other reaction(s): Other (See Comments)    ROS: Review of systems normal other than 12 systems except per HPI.  PMH:  Past Medical History:  Diagnosis Date   Anemia    IDA   Colon polyps    COPD (chronic obstructive pulmonary disease) (HCC)    Coronary artery disease    Heart attack (HCC) 2005   Hiatal hernia    History of ETOH abuse    Hyperlipidemia    Hypertension    Pleurisy    Schizophrenia (HCC)    Schizophrenia (HCC)    Seizures (HCC)    grand mal in 2000 or 2001 recovery from alcoholism   Suicide attempt [facility])     FH:  Family History  Problem Relation Age of Onset   Lung cancer Mother    Hypertension Father    Heart attack Father    CAD Father    Prostate cancer Neg Hx    Bladder  Cancer Neg Hx    Kidney cancer Neg Hx     SH:  Social History   Socioeconomic History   Marital status: Married    Spouse name: Not on file   Number of children: Not on file   Years of education: Not on file   Highest education level: Not on file  Occupational History   Not on file  Tobacco Use   Smoking status: Former    Packs/day: 1.00    Types: Cigarettes    Quit date: 2012    Years since quitting: 10.7   Smokeless tobacco: Former  Building services engineer Use: Never used  Substance and Sexual Activity   Alcohol use: No    Comment: no alcohol since 2010   Drug use: No   Sexual activity: Not Currently    Birth control/protection: None  Other Topics Concern   Not on file  Social History Narrative   ** Merged History Encounter **       Social Determinants of Health   Financial Resource Strain: Not on file  Food Insecurity: Not on file  Transportation Needs: Not on file  Physical Activity: Not on file  Stress: Not on file  Social Connections: Not on file  Intimate Partner Violence: Not on file    PSH:  Past Surgical History:  Procedure Laterality Date   ABDOMINAL SURGERY  internal bleeding   CHOLECYSTECTOMY N/A 05/04/2018   Procedure: LAPAROSCOPIC CHOLECYSTECTOMY, converted to open;  Surgeon: Sung Amabile, DO;  Location: [facility] ORS;  Service: General;  Laterality: N/A;   COLONOSCOPY     COLONOSCOPY WITH PROPOFOL N/A 05/09/2016   Procedure: COLONOSCOPY WITH PROPOFOL;  Surgeon: Scot Jun, MD;  Location: [facility] ENDOSCOPY;  Service: Endoscopy;  Laterality: N/A;   CORONARY ANGIOPLASTY WITH STENT PLACEMENT     1 vessel   ESOPHAGOGASTRODUODENOSCOPY (EGD) WITH PROPOFOL N/A 05/09/2016   Procedure: ESOPHAGOGASTRODUODENOSCOPY (EGD) WITH PROPOFOL;  Surgeon: Scot Jun, MD;  Location: [facility] ENDOSCOPY;  Service: Endoscopy;  Laterality: N/A;   EYE SURGERY Right    GLAUCOMA SURGERY     LAPAROSCOPIC APPENDECTOMY N/A 07/17/2018   Procedure: APPENDECTOMY LAPAROSCOPIC;   Surgeon: Sung Amabile, DO;  Location: [facility] ORS;  Service: General;  Laterality: N/A;   LEFT HEART CATH Right 10/19/2017   Procedure: Left Heart Cath and Coronary Angiography;  Surgeon: Laurier Nancy, MD;  Location: [facility] INVASIVE CV LAB;  Service: Cardiovascular;  Laterality: Right;   NOSE SURGERY     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  Good tongue movement and good palate movement.  Very poor effort on cough attempt with minimal movement of body EARS- external ears clear NOSE- clear anteriorly OC/OP-  WNL and tongue normal, no abnormaly handling of secretions NECK-  no LAD, no masses or lesions RESP- no labored breathing, no use of accessory muscles.  Poor cough effort with no sound  MRI-  chronic left maxillary sinusitis, T2 and FLAIR hyperintensity in the putamen bilaterally. There is mild diffusion hyperintensity which has developed in this area as well. These may be areas of recent ischemia. Correlate with history of hypoxic episode. Otherwise no acute infarct   A/P: Dysphonia with ischemic changes on MRI of putamen.  Plan:  Patient refused flexible laryngoscopy today.  Discussed reasoning to evaluate vocal folds and how it is performed and patient spelled out "no" on ABC board when asked for verbal consent.  Discussed that evaluation is limited if not able to see vocal folds and would recommend he follow up as outpatient then to see if I can do it at that time.  No acute intervention needed at this time.   Roney Mans Sherry Blackard 05/09/2021 1:06 PM

## 2021-05-09 NOTE — Consult Note (Signed)
[facility] Face-to-Face Psychiatry Consult   Reason for Consult: Follow-up consult 65 year old man with a history of schizoaffective disorder now with multiple other medical problems.  Currently unable to talk for an unclear reason Referring Physician:  Renae Gloss Patient Identification: Brandon [name] MRN:  [id number] Principal Diagnosis: Schizoaffective disorder, bipolar type (HCC) Diagnosis:  Principal Problem:   Schizoaffective disorder, bipolar type (HCC) Active Problems:   Coronary artery disease   Essential hypertension   COPD (chronic obstructive pulmonary disease) (HCC)   Acute hyponatremia   Right sided weakness   Schizophrenia (HCC)   BPH (benign prostatic hyperplasia)   Acute respiratory failure with hypoxia and hypercapnia (HCC)   Acute metabolic encephalopathy   Endotracheally intubated   On mechanically assisted ventilation (HCC)   Altered mental status   Palliative care by specialist   DNR (do not resuscitate)   Unresponsiveness   Dysarthria   Acute on chronic respiratory failure with hypoxia and hypercapnia (HCC)   Constipation   Total Time spent with patient: 30 minutes  Subjective:   Brandon [name] is a 65 y.o. male patient admitted with patient's communication is limited.  He was admitted to the hospital originally at the end of August because of symptoms of related to hyponatremia.Marland Kitchen  HPI: Patient has had an extended hospital course over a month now.  Came into the hospital originally with some weakness and confusion possible seizures related to hyponatremia but also some concern about alcohol use.  I first saw the patient on August 25 and at that time he was able to have a pretty normal conversation verbally which was understandable.  Patient suffered a decompensation sometime after that resulting in a need for ventilation and a period of severe illness in the intensive care unit.  He has now been out of the ICU and breathing on his own for many days but continues to be  unable to talk.  Patient will mouth words but not to the extent that you can read his lips.  He has a letter & pad that he can touch to spell words and communicate but even with that his communication seems limited.  Patient was awake when I came into the room.  Reminded him that clozapine dose had been decreased but not canceled altogether.  Patient indicated to me that he wanted the Clozapine level increased.  I asked him if that was for sleep or mood or hallucinations and anxiety or something else and he spelled out "all".  Noted that ENT came by today recommending laryngoscope to examine movement in his throat but the patient refused.  Past Psychiatric History: History of longstanding chronic mental health problems and alcohol problems  Risk to Self:   Risk to Others:   Prior Inpatient Therapy:   Prior Outpatient Therapy:    Past Medical History:  Past Medical History:  Diagnosis Date   Anemia    IDA   Colon polyps    COPD (chronic obstructive pulmonary disease) (HCC)    Coronary artery disease    Heart attack (HCC) 2005   Hiatal hernia    History of ETOH abuse    Hyperlipidemia    Hypertension    Pleurisy    Schizophrenia (HCC)    Schizophrenia (HCC)    Seizures (HCC)    grand mal in 2000 or 2001 recovery from alcoholism   Suicide attempt [facility])     Past Surgical History:  Procedure Laterality Date   ABDOMINAL SURGERY     internal bleeding  CHOLECYSTECTOMY N/A 05/04/2018   Procedure: LAPAROSCOPIC CHOLECYSTECTOMY, converted to open;  Surgeon: Sung Amabile, DO;  Location: [facility] ORS;  Service: General;  Laterality: N/A;   COLONOSCOPY     COLONOSCOPY WITH PROPOFOL N/A 05/09/2016   Procedure: COLONOSCOPY WITH PROPOFOL;  Surgeon: Scot Jun, MD;  Location: [facility] ENDOSCOPY;  Service: Endoscopy;  Laterality: N/A;   CORONARY ANGIOPLASTY WITH STENT PLACEMENT     1 vessel   ESOPHAGOGASTRODUODENOSCOPY (EGD) WITH PROPOFOL N/A 05/09/2016   Procedure: ESOPHAGOGASTRODUODENOSCOPY (EGD)  WITH PROPOFOL;  Surgeon: Scot Jun, MD;  Location: [facility] ENDOSCOPY;  Service: Endoscopy;  Laterality: N/A;   EYE SURGERY Right    GLAUCOMA SURGERY     LAPAROSCOPIC APPENDECTOMY N/A 07/17/2018   Procedure: APPENDECTOMY LAPAROSCOPIC;  Surgeon: Sung Amabile, DO;  Location: [facility] ORS;  Service: General;  Laterality: N/A;   LEFT HEART CATH Right 10/19/2017   Procedure: Left Heart Cath and Coronary Angiography;  Surgeon: Laurier Nancy, MD;  Location: [facility] INVASIVE CV LAB;  Service: Cardiovascular;  Laterality: Right;   NOSE SURGERY     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   VASECTOMY     Family History:  Family History  Problem Relation Age of Onset   Lung cancer Mother    Hypertension Father    Heart attack Father    CAD Father    Prostate cancer Neg Hx    Bladder Cancer Neg Hx    Kidney cancer Neg Hx    Family Psychiatric  History: See previous Social History:  Social History   Substance and Sexual Activity  Alcohol Use No   Comment: no alcohol since 2010     Social History   Substance and Sexual Activity  Drug Use No    Social History   Socioeconomic History   Marital status: Married    Spouse name: Not on file   Number of children: Not on file   Years of education: Not on file   Highest education level: Not on file  Occupational History   Not on file  Tobacco Use   Smoking status: Former    Packs/day: 1.00    Types: Cigarettes    Quit date: 2012    Years since quitting: 10.7   Smokeless tobacco: Former  Building services engineer Use: Never used  Substance and Sexual Activity   Alcohol use: No    Comment: no alcohol since 2010   Drug use: No   Sexual activity: Not Currently    Birth control/protection: None  Other Topics Concern   Not on file  Social History Narrative   ** Merged History Encounter **       Social Determinants of Health   Financial Resource Strain: Not on file  Food Insecurity: Not on file  Transportation Needs: Not on file  Physical  Activity: Not on file  Stress: Not on file  Social Connections: Not on file   Additional Social History:    Allergies:   Allergies  Allergen Reactions   Compazine [Prochlorperazine] Other (See Comments)    Dystonic rxn - convulsions. Near fatal reaction.    Ivp Dye [Iodinated Diagnostic Agents] Shortness Of Breath    Pt states SOB after last IV contrast injection   Alcohol-Sulfur [Elemental Sulfur] Other (See Comments)    History of alcoholism   Benadryl [Diphenhydramine] Other (See Comments)    "stuffy", nasal congestion   Depakote [Divalproex Sodium] Other (See Comments)    Cause elevated ammonia   Dramamine [  Dimenhydrinate] Swelling   Plavix [Clopidogrel] Other (See Comments)    Rectal bleeding. "Perforated my intestines."   Rosuvastatin     Other reaction(s): Other (See Comments)    Labs:  Results for orders placed or performed during the hospital encounter of 04/28/21 (from the past 48 hour(s))  SARS CORONAVIRUS 2 (TAT 6-24 HRS) Nasopharyngeal Nasopharyngeal Swab     Status: None   Collection Time: 05/07/21  2:24 PM   Specimen: Nasopharyngeal Swab  Result Value Ref Range   SARS Coronavirus 2 NEGATIVE NEGATIVE    Comment: (NOTE) SARS-CoV-2 target nucleic acids are NOT DETECTED.  The SARS-CoV-2 RNA is generally detectable in upper and lower respiratory specimens during the acute phase of infection. Negative results do not preclude SARS-CoV-2 infection, do not rule out co-infections with other pathogens, and should not be used as the sole basis for treatment or other patient management decisions. Negative results must be combined with clinical observations, patient history, and epidemiological information. The expected result is Negative.  Fact Sheet for Patients: HairSlick.no  Fact Sheet for Healthcare Providers: quierodirigir.com  This test is not yet approved or cleared by the [place] FDA and  has  been authorized for detection and/or diagnosis of SARS-CoV-2 by FDA under an Emergency Use Authorization (EUA). This EUA will remain  in effect (meaning this test can be used) for the duration of the COVID-19 declaration under Se ction 564(b)(1) of the Act, 21 U.S.C. section 360bbb-3(b)(1), unless the authorization is terminated or revoked sooner.  Performed at [facility], [address] [place], [place] [address]   Glucose, capillary     Status: None   Collection Time: 05/07/21  4:52 PM  Result Value Ref Range   Glucose-Capillary 88 70 - 99 mg/dL    Comment: Glucose reference range applies only to samples taken after fasting for at least 8 hours.  Glucose, capillary     Status: Abnormal   Collection Time: 05/07/21  8:36 PM  Result Value Ref Range   Glucose-Capillary 105 (H) 70 - 99 mg/dL    Comment: Glucose reference range applies only to samples taken after fasting for at least 8 hours.  Glucose, capillary     Status: Abnormal   Collection Time: 05/08/21 12:11 AM  Result Value Ref Range   Glucose-Capillary 100 (H) 70 - 99 mg/dL    Comment: Glucose reference range applies only to samples taken after fasting for at least 8 hours.  Glucose, capillary     Status: None   Collection Time: 05/08/21  3:54 AM  Result Value Ref Range   Glucose-Capillary 88 70 - 99 mg/dL    Comment: Glucose reference range applies only to samples taken after fasting for at least 8 hours.  Basic metabolic panel     Status: Abnormal   Collection Time: 05/08/21  5:05 AM  Result Value Ref Range   Sodium 132 (L) 135 - 145 mmol/L   Potassium 3.9 3.5 - 5.1 mmol/L   Chloride 94 (L) 98 - 111 mmol/L   CO2 29 22 - 32 mmol/L   Glucose, Bld 87 70 - 99 mg/dL    Comment: Glucose reference range applies only to samples taken after fasting for at least 8 hours.   BUN 9 8 - 23 mg/dL   Creatinine, Ser 6.21 (L) 0.61 - 1.24 mg/dL   Calcium 8.8 (L) 8.9 - 10.3 mg/dL   GFR, Estimated >30 >86 mL/min    Comment:  (NOTE) Calculated using the CKD-EPI Creatinine  Equation (2021)    Anion gap 9 5 - 15    Comment: Performed at [facility], [address] [place], [place] [address]  Magnesium     Status: None   Collection Time: 05/08/21  5:05 AM  Result Value Ref Range   Magnesium 1.7 1.7 - 2.4 mg/dL    Comment: Performed at [facility], [address] [place], [place] [address]  Phosphorus     Status: Abnormal   Collection Time: 05/08/21  5:05 AM  Result Value Ref Range   Phosphorus 4.8 (H) 2.5 - 4.6 mg/dL    Comment: Performed at [facility], [address] [place], [place] [address]  CBC with Differential/Platelet     Status: Abnormal   Collection Time: 05/08/21  5:05 AM  Result Value Ref Range   WBC 6.3 4.0 - 10.5 K/uL   RBC 3.94 (L) 4.22 - 5.81 MIL/uL   Hemoglobin 11.3 (L) 13.0 - 17.0 g/dL   HCT 41.6 (L) 60.6 - 30.1 %   MCV 89.1 80.0 - 100.0 fL   MCH 28.7 26.0 - 34.0 pg   MCHC 32.2 30.0 - 36.0 g/dL   RDW 60.1 09.3 - 23.5 %   Platelets 283 150 - 400 K/uL   nRBC 0.0 0.0 - 0.2 %   Neutrophils Relative % 58 %   Neutro Abs 3.7 1.7 - 7.7 K/uL   Lymphocytes Relative 25 %   Lymphs Abs 1.6 0.7 - 4.0 K/uL   Monocytes Relative 13 %   Monocytes Absolute 0.8 0.1 - 1.0 K/uL   Eosinophils Relative 2 %   Eosinophils Absolute 0.1 0.0 - 0.5 K/uL   Basophils Relative 1 %   Basophils Absolute 0.0 0.0 - 0.1 K/uL   Immature Granulocytes 1 %   Abs Immature Granulocytes 0.07 0.00 - 0.07 K/uL    Comment: Performed at [facility], [address] [place], [place] [address]  Glucose, capillary     Status: None   Collection Time: 05/08/21  8:18 AM  Result Value Ref Range   Glucose-Capillary 96 70 - 99 mg/dL    Comment: Glucose reference range applies only to samples taken after fasting for at least 8 hours.  Glucose, capillary     Status: Abnormal   Collection Time: 05/08/21 11:43 AM  Result Value Ref Range   Glucose-Capillary 124 (H) 70 - 99 mg/dL     Comment: Glucose reference range applies only to samples taken after fasting for at least 8 hours.  Glucose, capillary     Status: Abnormal   Collection Time: 05/08/21  4:52 PM  Result Value Ref Range   Glucose-Capillary 100 (H) 70 - 99 mg/dL    Comment: Glucose reference range applies only to samples taken after fasting for at least 8 hours.  Glucose, capillary     Status: Abnormal   Collection Time: 05/08/21  7:35 PM  Result Value Ref Range   Glucose-Capillary 106 (H) 70 - 99 mg/dL    Comment: Glucose reference range applies only to samples taken after fasting for at least 8 hours.  Glucose, capillary     Status: None   Collection Time: 05/09/21 12:34 AM  Result Value Ref Range   Glucose-Capillary 91 70 - 99 mg/dL    Comment: Glucose reference range applies only to samples taken after fasting for at least 8 hours.  Glucose, capillary     Status: None   Collection Time: 05/09/21  4:12 AM  Result Value Ref  Range   Glucose-Capillary 99 70 - 99 mg/dL    Comment: Glucose reference range applies only to samples taken after fasting for at least 8 hours.  Basic metabolic panel     Status: Abnormal   Collection Time: 05/09/21  4:57 AM  Result Value Ref Range   Sodium 134 (L) 135 - 145 mmol/L   Potassium 4.0 3.5 - 5.1 mmol/L   Chloride 96 (L) 98 - 111 mmol/L   CO2 30 22 - 32 mmol/L   Glucose, Bld 96 70 - 99 mg/dL    Comment: Glucose reference range applies only to samples taken after fasting for at least 8 hours.   BUN 10 8 - 23 mg/dL   Creatinine, Ser 7.84 (L) 0.61 - 1.24 mg/dL   Calcium 8.9 8.9 - 69.6 mg/dL   GFR, Estimated >29 >52 mL/min    Comment: (NOTE) Calculated using the CKD-EPI Creatinine Equation (2021)    Anion gap 8 5 - 15    Comment: Performed at [facility], [address] [place], [place] [address]  Lipid panel     Status: Abnormal   Collection Time: 05/09/21  4:57 AM  Result Value Ref Range   Cholesterol 103 0 - 200 mg/dL   Triglycerides 35 <440 mg/dL    HDL 40 (L) >10 mg/dL   Total CHOL/HDL Ratio 2.6 RATIO   VLDL 7 0 - 40 mg/dL   LDL Cholesterol 56 0 - 99 mg/dL    Comment:        Total Cholesterol/HDL:CHD Risk Coronary Heart Disease Risk Table                     Men   Women  1/2 Average Risk   3.4   3.3  Average Risk       5.0   4.4  2 X Average Risk   9.6   7.1  3 X Average Risk  23.4   11.0        Use the calculated Patient Ratio above and the CHD Risk Table to determine the patient's CHD Risk.        ATP III CLASSIFICATION (LDL):  <100     mg/dL   Optimal  272-536  mg/dL   Near or Above                    Optimal  130-159  mg/dL   Borderline  644-034  mg/dL   High  >742     mg/dL   Very High Performed at [facility], [address] [place], [place] [address]   Blood gas, arterial     Status: Abnormal   Collection Time: 05/09/21  7:08 AM  Result Value Ref Range   FIO2 0.28    Delivery systems NASAL CANNULA    pH, Arterial 7.39 7.350 - 7.450   pCO2 arterial 50 (H) 32.0 - 48.0 mmHg   pO2, Arterial 79 (L) 83.0 - 108.0 mmHg   Bicarbonate 30.3 (H) 20.0 - 28.0 mmol/L   Acid-Base Excess 4.4 (H) 0.0 - 2.0 mmol/L   O2 Saturation 95.4 %   Patient temperature 37.0    Collection site RIGHT RADIAL    Sample type ARTERIAL DRAW    Allens test (pass/fail) POSITIVE (A) PASS    Comment: Performed at [facility], [address] [place], [place] [address]  Glucose, capillary     Status: None   Collection Time: 05/09/21  7:56 AM  Result Value Ref Range   Glucose-Capillary 92 70 - 99 mg/dL    Comment: Glucose reference range applies only to samples taken after fasting for at least 8 hours.  Glucose, capillary     Status: Abnormal   Collection Time: 05/09/21 11:50 AM  Result Value Ref Range   Glucose-Capillary 102 (H) 70 - 99 mg/dL    Comment: Glucose reference range applies only to samples taken after fasting for at least 8 hours.    Current Facility-Administered Medications  Medication Dose Route  Frequency Provider Last Rate Last Admin   0.9 %  sodium chloride infusion   Intravenous PRN Rust-Chester, Cecelia Byars, NP   Stopped at 05/04/21 1557   arformoterol (BROVANA) nebulizer solution 15 mcg  15 mcg Nebulization BID Martina Sinner, MD   15 mcg at 05/09/21 3734   aspirin chewable tablet 81 mg  81 mg Oral Daily Alford Highland, MD   81 mg at 05/09/21 1020   atorvastatin (LIPITOR) tablet 20 mg  20 mg Oral QHS Mannam, Praveen, MD   20 mg at 05/08/21 2126   bisacodyl (DULCOLAX) suppository 10 mg  10 mg Rectal Daily PRN Rust-Chester, Micheline Rough L, NP   10 mg at 05/03/21 2301   budesonide (PULMICORT) nebulizer solution 0.5 mg  0.5 mg Nebulization BID Salena Saner, MD   0.5 mg at 05/09/21 2876   buPROPion ER (WELLBUTRIN SR) 12 hr tablet 100 mg  100 mg Oral Daily Lianne Cure, NP   100 mg at 05/09/21 1020   Chlorhexidine Gluconate Cloth 2 % PADS 6 each  6 each Topical Daily Martina Sinner, MD   6 each at 05/08/21 2126   cholecalciferol (VITAMIN D3) tablet 1,000 Units  1,000 Units Oral Daily Mannam, Praveen, MD   1,000 Units at 05/09/21 1020   cloZAPine (CLOZARIL) tablet 75 mg  75 mg Oral QHS Kaysha Parsell T, MD       docusate sodium (COLACE) capsule 200 mg  200 mg Oral BID Fabienne Bruns M, MD   200 mg at 05/09/21 1021   dorzolamide-timolol (COSOPT) 22.3-6.8 MG/ML ophthalmic solution 1 drop  1 drop Both Eyes BID Rust-Chester, Britton L, NP   1 drop at 05/09/21 1021   enoxaparin (LOVENOX) injection 40 mg  40 mg Subcutaneous Q24H Rust-Chester, Britton L, NP   40 mg at 05/08/21 2126   ezetimibe (ZETIA) tablet 10 mg  10 mg Oral QHS Mannam, Praveen, MD   10 mg at 05/08/21 2126   feeding supplement (ENSURE ENLIVE / ENSURE PLUS) liquid 237 mL  237 mL Oral TID BM Mannam, Praveen, MD   237 mL at 05/09/21 1021   hydrALAZINE (APRESOLINE) injection 10 mg  10 mg Intravenous Q6H PRN Martina Sinner, MD   10 mg at 05/01/21 2102   hydrOXYzine (ATARAX/VISTARIL) tablet 25 mg  25 mg Oral TID PRN  Lianne Cure, NP   25 mg at 05/06/21 1746   irbesartan (AVAPRO) tablet 75 mg  75 mg Oral Daily Alford Highland, MD   75 mg at 05/09/21 1020   isosorbide mononitrate (IMDUR) 24 hr tablet 30 mg  30 mg Oral Q12H Karl Ito, MD   30 mg at 05/09/21 1021   latanoprost (XALATAN) 0.005 % ophthalmic solution 1 drop  1 drop Both Eyes QHS Graves, Dana E, NP   1 drop at 05/08/21 2132   levalbuterol (XOPENEX) nebulizer solution 1.25 mg  1.25 mg Nebulization Q6H PRN Salena Saner, MD   1.25  mg at 05/07/21 4920   melatonin tablet 5 mg  5 mg Oral QHS Rust-Chester, Cecelia Byars, NP   5 mg at 05/08/21 2126   multivitamin with minerals tablet 1 tablet  1 tablet Oral Daily Mannam, Praveen, MD   1 tablet at 05/09/21 1021   nitroGLYCERIN (NITROSTAT) SL tablet 0.4 mg  0.4 mg Sublingual Q5 min PRN Lianne Cure, NP       pantoprazole (PROTONIX) EC tablet 40 mg  40 mg Oral Daily Mannam, Praveen, MD   40 mg at 05/09/21 1020   polyethylene glycol (MIRALAX / GLYCOLAX) packet 17 g  17 g Oral Daily PRN Rust-Chester, Cecelia Byars, NP       revefenacin (YUPELRI) nebulizer solution 175 mcg  175 mcg Nebulization Daily Martina Sinner, MD   175 mcg at 05/09/21 1007   senna (SENOKOT) tablet 8.6-17.2 mg  1-2 tablet Oral Daily PRN Lianne Cure, NP       tamsulosin (FLOMAX) capsule 0.4 mg  0.4 mg Oral Daily Lianne Cure, NP   0.4 mg at 05/09/21 1020    Musculoskeletal: Strength & Muscle Tone: decreased Gait & Station: unsteady Patient leans: N/A            Psychiatric Specialty Exam:  Presentation  General Appearance:  No data recorded Eye Contact: No data recorded Speech: No data recorded Speech Volume: No data recorded Handedness: No data recorded  Mood and Affect  Mood: No data recorded Affect: No data recorded  Thought Process  Thought Processes: No data recorded Descriptions of Associations:No data recorded Orientation:No data recorded Thought Content:No data recorded History of  Schizophrenia/Schizoaffective disorder:No data recorded Duration of Psychotic Symptoms:No data recorded Hallucinations:No data recorded Ideas of Reference:No data recorded Suicidal Thoughts:No data recorded Homicidal Thoughts:No data recorded  Sensorium  Memory: No data recorded Judgment: No data recorded Insight: No data recorded  Executive Functions  Concentration: No data recorded Attention Span: No data recorded Recall: No data recorded Fund of Knowledge: No data recorded Language: No data recorded  Psychomotor Activity  Psychomotor Activity: No data recorded  Assets  Assets: No data recorded  Sleep  Sleep: No data recorded  Physical Exam: Physical Exam Vitals and nursing note reviewed.  Constitutional:      Appearance: Normal appearance.  HENT:     Head: Normocephalic and atraumatic.     Mouth/Throat:     Pharynx: Oropharynx is clear.  Eyes:     Pupils: Pupils are equal, round, and reactive to light.  Cardiovascular:     Rate and Rhythm: Normal rate and regular rhythm.  Pulmonary:     Effort: Pulmonary effort is normal.     Breath sounds: Normal breath sounds.  Abdominal:     General: Abdomen is flat.     Palpations: Abdomen is soft.  Musculoskeletal:        General: Normal range of motion.  Skin:    General: Skin is warm and dry.  Neurological:     General: No focal deficit present.     Mental Status: He is alert. Mental status is at baseline.  Psychiatric:        Attention and Perception: He is inattentive.        Mood and Affect: Mood is depressed. Affect is blunt.        Speech: He is noncommunicative.        Behavior: Behavior is withdrawn.        Thought Content: Thought content normal. Thought content does  not include homicidal or suicidal ideation.        Cognition and Memory: Cognition is impaired.   Review of Systems  Unable to perform ROS: Patient nonverbal  Blood pressure 116/76, pulse 90, temperature 97.9 F (36.6 C),  temperature source Oral, resp. rate 16, height 6' (1.829 m), weight 73.9 kg, SpO2 95 %. Body mass index is 22.1 kg/m.  Treatment Plan Summary: Medication management and Plan patient his current condition seems to remain a little mysterious.  Not clear why he is still having so much trouble speaking.  MRI did not show a clear stroke.  He refused examination by ENT today.  Patient did clearly indicate to me he wanted his clozapine dose increased and that he was not sleeping well and not feeling well.  I think that it would be safe to do that since he has been tolerating the 50 mg.  At home he was taking 150 mg at night but when we restarted that in the hospital it coincided with when he decompensated so I am going to only go up very slowly.  I see in 1 note that mother had claimed that the patient was not able to speak throughout his hospital stay.  That is not accurate.  When I saw him on August 25 he was able to speak normally.  Disposition: No evidence of imminent risk to self or others at present.   Supportive therapy provided about ongoing stressors.  Mordecai Rasmussen, MD 05/09/2021 2:23 PM

## 2021-05-09 NOTE — Progress Notes (Signed)
Palliative:  HPI: 65 y.o. male  with past medical history of schizoaffective disorder, CAD, HTN, COPD, acute hyponatremia, BPH, acute respiratory failure with hypoxia and hypercapnia, and acute metabolic encephalopathy admitted on 04/28/2021 with hypoxia and hypotension from SNF [facility]. This is his 6th admission in 2022. Pt has been intubated and self extubated 9/24. Plan is to d/c to SNF for rehab. PMT consulted to discussion [place].  I met today with Brandon [name]. He is sitting up in bed and has been trying to feed himself lunch. He is able to use alphabet board to communicate as he cannot verbalize needs. He can at times nod head yes/no. However, he becomes very distracted and difficult to follow on alphabet board many times. He was able to express that he was uncomfortable on his bottom and this was alleviated by lying back and turning. He was also able to also express to me that he wanted me to get a message to his brother "is Rose ok?" I helped him to turn on television to [place] and he was smiling when I left him without any further complaints.   I called and spoke with brother/HCPOA, Brandon [name]. I shared with Brandon [name] his question about Brandon [name] and Brandon [name] shares that Brandon [name] is their sister-in-law that was in [facility] visiting her family and that she is doing just fine. Brandon [name] and I further discuss concern for possible ischemic/anoxic injury on MRI and likely vocal cord paralysis. Explained that Brandon [name] initially refused laryngoscopy but then changed his mind. We discussed limited options to reverse either of these issues. Brandon [name] feels Brandon [name] likely refused as he may have not completely understood and has been told about having breathing tube and that he likely would not successfully come off breathing tube if needed again. Brandon [name] has good understanding of trajectory and high risk of further complications that can lead to further decline. Brandon [name] does not want his brother to suffer. He wishes to continue with treatments to optimize  health. If he declines further he would consider comfort focused care.   All questions/concerns addressed. Emotional support provided.   Exam: Alert, seems oriented but difficult to maintain focus to discuss. No distress. Breathing regular, unlabored. Abd soft, flat.   Plan: - DNR - Continue interventions to optimize health - Time for outcomes  35 min  Vinie Sill, NP Palliative Medicine Team Pager [phone] (Please see [email or web address] for schedule) Team Phone [phone]    Greater than 50%  of this time was spent counseling and coordinating care related to the above assessment and plan

## 2021-05-10 DIAGNOSIS — J9601 Acute respiratory failure with hypoxia: Secondary | ICD-10-CM | POA: Diagnosis not present

## 2021-05-10 DIAGNOSIS — J441 Chronic obstructive pulmonary disease with (acute) exacerbation: Secondary | ICD-10-CM

## 2021-05-10 DIAGNOSIS — R29898 Other symptoms and signs involving the musculoskeletal system: Secondary | ICD-10-CM | POA: Diagnosis not present

## 2021-05-10 DIAGNOSIS — R471 Dysarthria and anarthria: Secondary | ICD-10-CM | POA: Diagnosis not present

## 2021-05-10 LAB — GLUCOSE, CAPILLARY
Glucose-Capillary: 87 mg/dL (ref 70–99)
Glucose-Capillary: 88 mg/dL (ref 70–99)
Glucose-Capillary: 90 mg/dL (ref 70–99)
Glucose-Capillary: 90 mg/dL (ref 70–99)
Glucose-Capillary: 91 mg/dL (ref 70–99)
Glucose-Capillary: 99 mg/dL (ref 70–99)

## 2021-05-10 NOTE — Progress Notes (Signed)
Pharmacy - Clozapine     This patient's order has been reviewed for prescribing contraindications.   Labs: ANC 3.7 9/28  The medication is being dispensed pursuant to the FDA REMS suspension order of 06/29/20 that allows for dispensing without a patient REMS dispense authorization (RDA).    Doloris Hall, PharmD Pharmacy Resident  05/10/2021 2:26 PM

## 2021-05-10 NOTE — Progress Notes (Addendum)
Physical Therapy Treatment Patient Details Name: Brandon [name] MRN: [id number] DOB: [birth year] Today's Date: 05/10/2021   History of Present Illness Pt is a 65 year old male with PMH including schizophrenia, seizures (ETOH withdrawal related), and ETOH abuse presenting to [facility] ED on 04/28/2021 from [facility] via EMS.  Staff at [facility] called EMS after patient was found to be unresponsive and reportedly hypotensive in the 50s for an unknown amount of time.  At [facility] patient became hypoxic with SPO2 in the 70s and was too confused to safely tolerate BiPAP requiring emergent intubation and mechanical ventilatory support. Pt was eventully extubated but needed to be reintubated 9/20 and was again extubated 9/21.  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    PT Comments    Pt ready for session. Supine AA/PROM 2 x 10.  To/from EOB with mod/max a x 2.  Sitting with min assist for safety.  He is able to stand x 3 with RW and heavy mod a x 2.  Knees flexed but overall quality improved from prior session.  He does attempt several times to straighten knees on his own and stand more upright.  Case worker from [employer] in upon arrival.  Trying to use letter board but pt having difficulty with it.  He does seem to perk up during session and a times laughing and smiling during session and seems brighter and more engaged.    SNF remains appropriate for discharge.  Pt is mechanical lift appropriate for OOB transfers.  Sit to stand lift would be good for pt.  He is able to sit with min assist and does fairstanding and tolerates it well but is unable to move feet or attempt steps.    Recommendations for follow up therapy are one component of a multi-disciplinary discharge planning process, led by the attending physician.  Recommendations may be updated based on patient status, additional functional criteria and insurance authorization.  Follow Up Recommendations  SNF;Supervision/Assistance - 24 hour     Equipment Recommendations       Recommendations for Other Services       Precautions / Restrictions Precautions Precautions: Fall Restrictions Weight Bearing Restrictions: No     Mobility  Bed Mobility Overal bed mobility: Needs Assistance Bed Mobility: Supine to Sit;Sit to Supine Rolling: Mod assist;+2 for physical assistance   Supine to sit: Mod assist;+2 for physical assistance          Transfers Overall transfer level: Needs assistance Equipment used: Rolling walker (2 wheeled) Transfers: Sit to/from Stand Sit to Stand: Mod assist;+2 physical assistance;Max assist;From elevated surface            Ambulation/Gait         Gait velocity: decreased   General Gait Details: Unable to advance either LE in standing   Stairs             Wheelchair Mobility    Modified [name] (Stroke Patients Only)       Balance Overall balance assessment: Needs assistance Sitting-balance support: No upper extremity supported;Feet supported Sitting balance-[name] Scale: Poor     Standing balance support: Bilateral upper extremity supported Standing balance-[name] Scale: Zero  Cognition Arousal/Alertness: Awake/alert Behavior During Therapy: WFL for tasks assessed/performed Overall Cognitive Status: Difficult to assess                                 General Comments: Pt communicated with AAC and ABC board 2/2 hypophonia      Exercises Other Exercises Other Exercises: 2 x 10 supine    General Comments        Pertinent Vitals/Pain Pain Assessment: No/denies pain    Home  Living                      Prior Function            PT Goals (current goals can now be found in the care plan section) Progress towards PT goals: Progressing toward goals    Frequency    Min 2X/week      PT Plan Current plan remains appropriate    Co-evaluation              AM-PAC PT "6 Clicks" Mobility   Outcome Measure  Help needed turning from your back to your side while in a flat bed without using bedrails?: A Lot Help needed moving from lying on your back to sitting on the side of a flat bed without using bedrails?: Total Help needed moving to and from a bed to a chair (including a wheelchair)?: Total Help needed standing up from a chair using your arms (e.g., wheelchair or bedside chair)?: Total Help needed to walk in hospital room?: Total Help needed climbing 3-5 steps with a railing? : Total 6 Click Score: 7    End of Session Equipment Utilized During Treatment: Gait belt Activity Tolerance: Patient tolerated treatment well Patient left: in bed;with call bell/phone within reach;with bed alarm set Nurse Communication: Mobility status PT Visit Diagnosis: Unsteadiness on feet (R26.81);Muscle weakness (generalized) (M62.81);Difficulty in walking, not elsewhere classified (R26.2)     Time: 5462-7035 PT Time Calculation (min) (ACUTE ONLY): 25 min  Charges:  $Therapeutic Exercise: 8-22 mins $Therapeutic Activity: 8-22 mins                    Danielle Dess, PTA 05/10/21, 9:55 AM

## 2021-05-10 NOTE — Progress Notes (Signed)
Occupational Therapy Treatment Patient Details Name: Brandon [name] MRN: [id number] DOB: 1956-02-03 Today's Date: 05/10/2021   History of present illness Pt is a 65 year old male with PMH including schizophrenia, seizures (ETOH withdrawal related), and ETOH abuse presenting to [facility] ED on 04/28/2021 from SN[facility] via EMS.  Staff at [facility] called EMS after patient was found to be unresponsive and reportedly hypotensive in the 50s for an unknown amount of time.  At [facility] patient became hypoxic with SPO2 in the 70s and was too confused to safely tolerate BiPAP requiring emergent intubation and mechanical ventilatory support. Pt was eventully extubated but needed to be reintubated 9/20 and was again extubated 9/21.  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   OT comments  Pt seen for OT tx this date to f/u re: safety with ADLs. OT engages pt in bathing with MOD A +2 for rolling technique and MOD A for UB bathing, MAX A for LB bathing. Pt requires MIN verbal cues to sequence rolling including hand placement/use of bed rails to assist with rolls. Pt requires MIN A to don gown including orienting garment correctly and TOTAL A to manage socks although he does make a good effort to raise and reach L LE. His R side is significantly weaker, but he is able to perform active assist ROM to flex/ext R knee and with attempts to elevate R LE off bed.    Recommendations for follow up therapy are one component of a multi-disciplinary discharge planning process, led by the attending physician.  Recommendations may be updated based on patient status, additional functional criteria and insurance authorization.     Follow Up Recommendations  SNF;Supervision/Assistance - 24 hour    Equipment Recommendations  Other (comment) (defer)    Recommendations for Other Services      Precautions / Restrictions Precautions Precautions: Fall Restrictions Weight Bearing Restrictions: No       Mobility Bed Mobility Overal bed mobility: Needs Assistance Bed Mobility: Rolling Rolling: Mod assist;+2 for physical assistance         General bed mobility comments: rolling for bathing tasks    Transfers                 General transfer comment: deferred    Balance                                           ADL either performed or assessed with clinical judgement   ADL Overall ADL's : Needs assistance/impaired         Upper Body Bathing: Moderate assistance;Bed level Upper Body Bathing Details (indicate cue type and reason): MOD +2 for rolling, Pt able to perform anterior with SETUP, requires MAX A for posterior for total of MOD A for UB bathing Lower Body Bathing: Maximal assistance;Bed level Lower Body Bathing Details (indicate cue type and reason): MOD A +2 for rolling Upper Body Dressing : Minimal assistance;Bed level   Lower Body Dressing: Total assistance;Bed level Lower Body Dressing Details (indicate cue type and reason): socks                     Vision  Perception     Praxis      Cognition Arousal/Alertness: Awake/alert Behavior During Therapy: WFL for tasks assessed/performed Overall Cognitive Status: Difficult to assess Area of Impairment: Following commands;Safety/judgement;Awareness                       Following Commands: Follows multi-step commands with increased time Safety/Judgement: Decreased awareness of safety;Decreased awareness of deficits     General Comments: pt is able to mouth some words to therapist to communicate needs and also uses pointing gestures. He is appropriate with command following although  easily distracted requiring re-direction for attn to task        Exercises Other Exercises Other Exercises: OT engages pt in bathing tasks   Shoulder Instructions       General Comments      Pertinent Vitals/ Pain       Pain Assessment: Faces Faces Pain Scale: Hurts a little bit Pain Location: Bottom Pain Descriptors / Indicators: Grimacing Pain Intervention(s): Limited activity within patient's tolerance;Monitored during session  Home Living                                          Prior Functioning/Environment              Frequency  Min 2X/week        Progress Toward Goals  OT Goals(current goals can now be found in the care plan section)  Progress towards OT goals: Progressing toward goals  Acute Rehab OT Goals Patient Stated Goal: to have more therapy OT Goal Formulation: With patient Time For Goal Achievement: 05/18/21 Potential to Achieve Goals: Good  Plan Discharge plan remains appropriate;Frequency remains appropriate    Co-evaluation                 AM-PAC OT "6 Clicks" Daily Activity     Outcome Measure   Help from another person eating meals?: A Little Help from another person taking care of personal grooming?: A Little Help from another person toileting, which includes using toliet, bedpan, or urinal?: A Lot Help from another person bathing (including washing, rinsing, drying)?: A Lot Help from another person to put on and taking off regular upper body clothing?: A Little Help from another person to put on and taking off regular lower body clothing?: A Lot 6 Click Score: 15    End of Session Equipment Utilized During Treatment: Oxygen  OT Visit Diagnosis: Unsteadiness on feet (R26.81)   Activity Tolerance Patient tolerated treatment well   Patient Left in bed;with call bell/phone within reach;with bed alarm set   Nurse Communication Mobility status        Time: 9604-5409 OT Time Calculation (min): 23  min  Charges: OT General Charges $OT Visit: 1 Visit OT Treatments $Self Care/Home Management : 23-37 mins  Rejeana Brock, MS, OTR/L ascom [phone] 05/10/21, 4:55 PM

## 2021-05-10 NOTE — Progress Notes (Signed)
Patient ID: Brandon [name], male   DOB: 02/29/1956, 65 y.o.   MRN: [id number] [facility] PROGRESS NOTE  ORRY SIGL LPF:[id number] DOB: March 02, 1956 DOA: 04/28/2021 PCP: Sherron Monday, MD  HPI/Subjective: Patient able to vocalize a little bit today.  He was able to say no and yes.  Patient was able to communicate by pointing to letters in spelling out words.  Objective: Vitals:   05/10/21 0828 05/10/21 1221  BP:  109/75  Pulse:  97  Resp:  17  Temp:  97.7 F (36.5 C)  SpO2: 94% 98%    Intake/Output Summary (Last 24 hours) at 05/10/2021 1508 Last data filed at 05/10/2021 1417 Gross per 24 hour  Intake 600 ml  Output 1950 ml  Net -1350 ml   Filed Weights   05/08/21 0348 05/09/21 0416 05/10/21 0338  Weight: 73.7 kg 73.9 kg 72.4 kg    ROS: Review of Systems  Unable to perform ROS: Acuity of condition  Exam: Physical Exam HENT:     Head: Normocephalic.     Mouth/Throat:     Pharynx: No oropharyngeal exudate.  Eyes:     General: Lids are normal.     Conjunctiva/sclera: Conjunctivae normal.     Pupils: Pupils are equal, round, and reactive to light.  Cardiovascular:     Rate and Rhythm: Normal rate and regular rhythm.     Heart sounds: Normal heart sounds, S1 normal and S2 normal.  Pulmonary:     Breath sounds: Examination of the right-lower field reveals decreased breath sounds. Examination of the left-lower field reveals decreased breath sounds. Decreased breath sounds present. No wheezing, rhonchi or rales.  Abdominal:     Palpations: Abdomen is soft.     Tenderness: There is no abdominal tenderness.  Musculoskeletal:     Right lower leg: Swelling present.     Left lower leg: Swelling present.  Skin:    General: Skin is warm.     Findings: No rash.  Neurological:     Mental Status: He is alert.     Comments: Able to point to letters and spell words. Able to vocalize yes or no.       Scheduled Meds:  arformoterol  15 mcg Nebulization BID    aspirin  81 mg Oral Daily   atorvastatin  20 mg Oral QHS   budesonide (PULMICORT) nebulizer solution  0.5 mg Nebulization BID   buPROPion ER  100 mg Oral Daily   Chlorhexidine Gluconate Cloth  6 each Topical Daily   cholecalciferol  1,000 Units Oral Daily   cloZAPine  75 mg Oral QHS   docusate sodium  200 mg Oral BID   dorzolamide-timolol  1 drop Both Eyes BID   enoxaparin (LOVENOX) injection  40 mg Subcutaneous Q24H   ezetimibe  10 mg Oral QHS   isosorbide mononitrate  30 mg Oral Daily   latanoprost  1 drop Both Eyes QHS   melatonin  5 mg Oral QHS   multivitamin with minerals  1 tablet Oral Daily   pantoprazole  40 mg Oral Daily   revefenacin  175 mcg Nebulization Daily   tamsulosin  0.4 mg Oral Daily   Continuous Infusions:  sodium chloride Stopped (05/04/21 1557)    Assessment/Plan:  Dysarthria with right leg weakness.  MRI showing flair hyperintensity in the putamen bilaterally.  Possibly ischemia from hypotension and/or hypoxia from admission.  Started aspirin.  Patient declined ENT looking at the vocal cords. Acute metabolic encephalopathy with history of  schizophrenia.  Patient on clozapine.  Patient also on Wellbutrin.  Mental status seems a little bit better today where he is able to point to letters and spell words. Acute on chronic hypoxic hypercapnic respiratory failure.  History of COPD.  Continue nebulizer treatments. Orthostatic hypotension.  I discontinued Avapro yesterday.  Rather high blood pressure being a little on the higher side Acute on chronic hyponatremia.  Initial sodium 122.  Most recent sodium 134 BPH on Flomax Weakness.  PT recommending rehab.  Transitional care team still looking into options.  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  Check labs with CPK in the morning.    Code Status:     Code Status Orders  (From admission, onward)           Start     Ordered   05/03/21 1533  Do not attempt resuscitation (DNR)  Continuous       Question  Answer Comment  In the event of cardiac or respiratory ARREST Do not call a "code blue"   In the event of cardiac or respiratory ARREST Do not perform Intubation, CPR, defibrillation or ACLS   In the event of cardiac or respiratory ARREST Use medication by any route, position, wound care, and other measures to relive pain and suffering. May use oxygen, suction and manual treatment of airway obstruction as needed for comfort.      05/03/21 1533           Code Status History     Date Active Date Inactive Code Status Order ID Comments User Context   04/28/2021 2115 05/03/2021 1533 Full Code [id number]  Rust-Chester, Cecelia Byars, NP ED   04/12/2021 2216 04/17/2021 1709 Full Code [id number]  Cox, Nadyne Coombes, DO ED   04/03/2021 2041 04/12/2021 0143 Full Code [id number]  Mansy, Vernetta Honey, MD ED   03/22/2021 1851 03/25/2021 2231 Full Code [id number]  Anselm Jungling, DO ED   11/27/2020 2331 12/03/2020 2155 Full Code [id number]  Acheampong, Genice Rouge, MD ED   07/17/2018 1249 07/20/2018 1726 Full Code [id number]  Sung Amabile, DO Inpatient   05/04/2018 2029 05/07/2018 1634 Full Code [id number]  Sung Amabile, DO Inpatient   04/08/2018 2011 04/12/2018 1441 Full Code [id number]  Sung Amabile, DO Inpatient   10/19/2017 1252 10/19/2017 1820 Full Code [id number]  Laurier Nancy, MD Inpatient      Family Communication: Spoke with brother on the phone Disposition Plan: Status is: Inpatient  Dispo: The patient is from: Home              Anticipated d/c is to: Rehab              Patient currently stable to go to rehab once we find a bed   Difficult to place patient.  Yes.  Time spent: 27 minutes  Loriel Diehl [employer]

## 2021-05-11 DIAGNOSIS — R29898 Other symptoms and signs involving the musculoskeletal system: Secondary | ICD-10-CM | POA: Diagnosis not present

## 2021-05-11 DIAGNOSIS — R471 Dysarthria and anarthria: Secondary | ICD-10-CM | POA: Diagnosis not present

## 2021-05-11 DIAGNOSIS — G9341 Metabolic encephalopathy: Secondary | ICD-10-CM | POA: Diagnosis not present

## 2021-05-11 DIAGNOSIS — J9621 Acute and chronic respiratory failure with hypoxia: Secondary | ICD-10-CM | POA: Diagnosis not present

## 2021-05-11 LAB — BASIC METABOLIC PANEL
Anion gap: 9 (ref 5–15)
BUN: 12 mg/dL (ref 8–23)
CO2: 29 mmol/L (ref 22–32)
Calcium: 9.1 mg/dL (ref 8.9–10.3)
Chloride: 94 mmol/L — ABNORMAL LOW (ref 98–111)
Creatinine, Ser: 0.48 mg/dL — ABNORMAL LOW (ref 0.61–1.24)
GFR, Estimated: >60 mL/min (ref >60)
Glucose, Bld: 85 mg/dL (ref 70–99)
Potassium: 3.7 mmol/L (ref 3.5–5.1)
Sodium: 132 mmol/L — ABNORMAL LOW (ref 135–145)

## 2021-05-11 LAB — CBC
HCT: 36.5 % — ABNORMAL LOW (ref 39.0–52.0)
Hemoglobin: 12.2 g/dL — ABNORMAL LOW (ref 13.0–17.0)
MCH: 29.7 pg (ref 26.0–34.0)
MCHC: 33.4 g/dL (ref 30.0–36.0)
MCV: 88.8 fL (ref 80.0–100.0)
Platelets: 290 10*3/uL (ref 150–400)
RBC: 4.11 MIL/uL — ABNORMAL LOW (ref 4.22–5.81)
RDW: 13.8 % (ref 11.5–15.5)
WBC: 7.6 10*3/uL (ref 4.0–10.5)
nRBC: 0 % (ref 0.0–0.2)

## 2021-05-11 LAB — CK: Total CK: 32 U/L — ABNORMAL LOW (ref 49–397)

## 2021-05-11 MED ORDER — LACTULOSE 10 GM/15ML PO SOLN
30.0000 g | Freq: Three times a day (TID) | ORAL | Status: DC
  Administered 2021-05-11 – 2021-05-14 (×10): 30 g via ORAL
  Filled 2021-05-11 (×11): qty 60

## 2021-05-11 NOTE — Progress Notes (Signed)
Chaplain Maggie made follow up visit at bedside to share a traditional Catholic prayer with pt. Pt smiled and was receptive to visit. Continued support available per on call chaplain.

## 2021-05-11 NOTE — Consult Note (Signed)
[facility] Face-to-Face Psychiatry Consult   Reason for Consult: Follow-up consult 65 year old man with a history of schizoaffective disorder now with multiple other medical problems.  Currently unable to talk for an unclear reason Referring Physician:  W[place] Patient Identification: Brandon [name] MRN:  [id number] Principal Diagnosis: Schizoaffective disorder, bipolar type (HCC) Diagnosis:  Principal Problem:   Schizoaffective disorder, bipolar type (HCC) Active Problems:   Coronary artery disease   Essential hypertension   COPD (chronic obstructive pulmonary disease) (HCC)   Acute hyponatremia   Right sided weakness   Schizophrenia (HCC)   BPH (benign prostatic hyperplasia)   Acute respiratory failure with hypoxia and hypercapnia (HCC)   Acute metabolic encephalopathy   Endotracheally intubated   On mechanically assisted ventilation (HCC)   Altered mental status   Palliative care by specialist   DNR (do not resuscitate)   Unresponsiveness   Dysarthria   Acute on chronic respiratory failure with hypoxia and hypercapnia (HCC)   Constipation   Weakness of right leg   Total Time spent with patient: 30 minutes  Subjective:   Brandon [name] is a 65 y.o. male patient admitted with patient's communication is limited.  He was admitted to the hospital originally at the end of August because of symptoms of related to hyponatremia.  Client seen and evaluated today, he remains calm and cooperative.  He mouths but does not vocalize words.  ABC board used to communicate.  Denies suicidal/homicidal ideations, hallucinations, and did not need anything.  No side effects from his medications.  HPI per Dr Toni Amend: Patient has had an extended hospital course over a month now.  Came into the hospital originally with some weakness and confusion possible seizures related to hyponatremia but also some concern about alcohol use.  I first saw the patient on August 25 and at that time he was able to have a  pretty normal conversation verbally which was understandable.  Patient suffered a decompensation sometime after that resulting in a need for ventilation and a period of severe illness in the intensive care unit.  He has now been out of the ICU and breathing on his own for many days but continues to be unable to talk.  Patient will mouth words but not to the extent that you can read his lips.  He has a letter & pad that he can touch to spell words and communicate but even with that his communication seems limited.  Patient was awake when I came into the room.  Reminded him that clozapine dose had been decreased but not canceled altogether.  Patient indicated to me that he wanted the Clozapine level increased.  I asked him if that was for sleep or mood or hallucinations and anxiety or something else and he spelled out "all".  Noted that ENT came by today recommending laryngoscope to examine movement in his throat but the patient refused.  Past Psychiatric History: History of longstanding chronic mental health problems and alcohol problems  Risk to Self:  none Risk to Others:  none Prior Inpatient Therapy:  multiple Prior Outpatient Therapy:  yes  Past Medical History:  Past Medical History:  Diagnosis Date   Anemia    IDA   Colon polyps    COPD (chronic obstructive pulmonary disease) (HCC)    Coronary artery disease    Heart attack (HCC) 2005   Hiatal hernia    History of ETOH abuse    Hyperlipidemia    Hypertension    Pleurisy    Schizophrenia (  HCC)    Schizophrenia (HCC)    Seizures (HCC)    grand mal in 2000 or 2001 recovery from alcoholism   Suicide attempt [facility])     Past Surgical History:  Procedure Laterality Date   ABDOMINAL SURGERY     internal bleeding   CHOLECYSTECTOMY N/A 05/04/2018   Procedure: LAPAROSCOPIC CHOLECYSTECTOMY, converted to open;  Surgeon: Sung Amabile, DO;  Location: [facility] ORS;  Service: General;  Laterality: N/A;   COLONOSCOPY     COLONOSCOPY WITH PROPOFOL  N/A 05/09/2016   Procedure: COLONOSCOPY WITH PROPOFOL;  Surgeon: Scot Jun, MD;  Location: [facility] ENDOSCOPY;  Service: Endoscopy;  Laterality: N/A;   CORONARY ANGIOPLASTY WITH STENT PLACEMENT     1 vessel   ESOPHAGOGASTRODUODENOSCOPY (EGD) WITH PROPOFOL N/A 05/09/2016   Procedure: ESOPHAGOGASTRODUODENOSCOPY (EGD) WITH PROPOFOL;  Surgeon: Scot Jun, MD;  Location: [facility] ENDOSCOPY;  Service: Endoscopy;  Laterality: N/A;   EYE SURGERY Right    GLAUCOMA SURGERY     LAPAROSCOPIC APPENDECTOMY N/A 07/17/2018   Procedure: APPENDECTOMY LAPAROSCOPIC;  Surgeon: Sung Amabile, DO;  Location: [facility] ORS;  Service: General;  Laterality: N/A;   LEFT HEART CATH Right 10/19/2017   Procedure: Left Heart Cath and Coronary Angiography;  Surgeon: Laurier Nancy, MD;  Location: [facility] INVASIVE CV LAB;  Service: Cardiovascular;  Laterality: Right;   NOSE SURGERY     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   VASECTOMY     Family History:  Family History  Problem Relation Age of Onset   Lung cancer Mother    Hypertension Father    Heart attack Father    CAD Father    Prostate cancer Neg Hx    Bladder Cancer Neg Hx    Kidney cancer Neg Hx    Family Psychiatric  History: See previous Social History:  Social History   Substance and Sexual Activity  Alcohol Use No   Comment: no alcohol since 2010     Social History   Substance and Sexual Activity  Drug Use No    Social History   Socioeconomic History   Marital status: Married    Spouse name: Not on file   Number of children: Not on file   Years of education: Not on file   Highest education level: Not on file  Occupational History   Not on file  Tobacco Use   Smoking status: Former    Packs/day: 1.00    Types: Cigarettes    Quit date: 2012    Years since quitting: 10.7   Smokeless tobacco: Former  Building services engineer Use: Never used  Substance and Sexual Activity   Alcohol use: No    Comment: no alcohol since 2010   Drug use: No   Sexual  activity: Not Currently    Birth control/protection: None  Other Topics Concern   Not on file  Social History Narrative   ** Merged History Encounter **       Social Determinants of Health   Financial Resource Strain: Not on file  Food Insecurity: Not on file  Transportation Needs: Not on file  Physical Activity: Not on file  Stress: Not on file  Social Connections: Not on file   Additional Social History:    Allergies:   Allergies  Allergen Reactions   Compazine [Prochlorperazine] Other (See Comments)    Dystonic rxn - convulsions. Near fatal reaction.    Ivp Dye [Iodinated Diagnostic Agents] Shortness Of Breath    Pt  states SOB after last IV contrast injection   Alcohol-Sulfur [Elemental Sulfur] Other (See Comments)    History of alcoholism   Benadryl [Diphenhydramine] Other (See Comments)    "stuffy", nasal congestion   Depakote [Divalproex Sodium] Other (See Comments)    Cause elevated ammonia   Dramamine [Dimenhydrinate] Swelling   Plavix [Clopidogrel] Other (See Comments)    Rectal bleeding. "Perforated my intestines."   Rosuvastatin     Other reaction(s): Other (See Comments)    Labs:  Results for orders placed or performed during the hospital encounter of 04/28/21 (from the past 48 hour(s))  Glucose, capillary     Status: Abnormal   Collection Time: 05/09/21  4:34 PM  Result Value Ref Range   Glucose-Capillary 114 (H) 70 - 99 mg/dL    Comment: Glucose reference range applies only to samples taken after fasting for at least 8 hours.  Glucose, capillary     Status: Abnormal   Collection Time: 05/09/21  8:56 PM  Result Value Ref Range   Glucose-Capillary 108 (H) 70 - 99 mg/dL    Comment: Glucose reference range applies only to samples taken after fasting for at least 8 hours.  Glucose, capillary     Status: None   Collection Time: 05/10/21 12:11 AM  Result Value Ref Range   Glucose-Capillary 87 70 - 99 mg/dL    Comment: Glucose reference range applies  only to samples taken after fasting for at least 8 hours.   Comment 1 Notify RN   Glucose, capillary     Status: None   Collection Time: 05/10/21  3:37 AM  Result Value Ref Range   Glucose-Capillary 90 70 - 99 mg/dL    Comment: Glucose reference range applies only to samples taken after fasting for at least 8 hours.  Glucose, capillary     Status: None   Collection Time: 05/10/21  4:03 AM  Result Value Ref Range   Glucose-Capillary 90 70 - 99 mg/dL    Comment: Glucose reference range applies only to samples taken after fasting for at least 8 hours.  Glucose, capillary     Status: None   Collection Time: 05/10/21  7:29 AM  Result Value Ref Range   Glucose-Capillary 91 70 - 99 mg/dL    Comment: Glucose reference range applies only to samples taken after fasting for at least 8 hours.  Glucose, capillary     Status: None   Collection Time: 05/10/21 12:20 PM  Result Value Ref Range   Glucose-Capillary 99 70 - 99 mg/dL    Comment: Glucose reference range applies only to samples taken after fasting for at least 8 hours.  Glucose, capillary     Status: None   Collection Time: 05/10/21  4:43 PM  Result Value Ref Range   Glucose-Capillary 88 70 - 99 mg/dL    Comment: Glucose reference range applies only to samples taken after fasting for at least 8 hours.  CK     Status: Abnormal   Collection Time: 05/11/21  4:57 AM  Result Value Ref Range   Total CK 32 (L) 49 - 397 U/L    Comment: Performed at [facility], [address] [place], [place] [address]  Basic metabolic panel     Status: Abnormal   Collection Time: 05/11/21  4:57 AM  Result Value Ref Range   Sodium 132 (L) 135 - 145 mmol/L   Potassium 3.7 3.5 - 5.1 mmol/L   Chloride 94 (L) 98 - 111 mmol/L  CO2 29 22 - 32 mmol/L   Glucose, Bld 85 70 - 99 mg/dL    Comment: Glucose reference range applies only to samples taken after fasting for at least 8 hours.   BUN 12 8 - 23 mg/dL   Creatinine, Ser 4.31 (L) 0.61 - 1.24 mg/dL    Calcium 9.1 8.9 - 54.0 mg/dL   GFR, Estimated >08 >67 mL/min    Comment: (NOTE) Calculated using the CKD-EPI Creatinine Equation (2021)    Anion gap 9 5 - 15    Comment: Performed at [facility], [address] [place], [place] [address]  CBC     Status: Abnormal   Collection Time: 05/11/21  4:57 AM  Result Value Ref Range   WBC 7.6 4.0 - 10.5 K/uL   RBC 4.11 (L) 4.22 - 5.81 MIL/uL   Hemoglobin 12.2 (L) 13.0 - 17.0 g/dL   HCT 93.2 (L) 67.1 - 24.5 %   MCV 88.8 80.0 - 100.0 fL   MCH 29.7 26.0 - 34.0 pg   MCHC 33.4 30.0 - 36.0 g/dL   RDW 80.9 98.3 - 38.2 %   Platelets 290 150 - 400 K/uL   nRBC 0.0 0.0 - 0.2 %    Comment: Performed at [facility], [address] [place], [place] [address]    Current Facility-Administered Medications  Medication Dose Route Frequency Provider Last Rate Last Admin   0.9 %  sodium chloride infusion   Intravenous PRN Rust-Chester, Cecelia Byars, NP   Stopped at 05/04/21 1557   arformoterol (BROVANA) nebulizer solution 15 mcg  15 mcg Nebulization BID Martina Sinner, MD   15 mcg at 05/11/21 7673   aspirin chewable tablet 81 mg  81 mg Oral Daily Alford Highland, MD   81 mg at 05/11/21 1000   atorvastatin (LIPITOR) tablet 20 mg  20 mg Oral QHS Mannam, Praveen, MD   20 mg at 05/10/21 2137   bisacodyl (DULCOLAX) suppository 10 mg  10 mg Rectal Daily PRN Rust-Chester, Micheline Rough L, NP   10 mg at 05/03/21 2301   budesonide (PULMICORT) nebulizer solution 0.5 mg  0.5 mg Nebulization BID Salena Saner, MD   0.5 mg at 05/11/21 0729   buPROPion ER (WELLBUTRIN SR) 12 hr tablet 100 mg  100 mg Oral Daily Lianne Cure, NP   100 mg at 05/11/21 1000   Chlorhexidine Gluconate Cloth 2 % PADS 6 each  6 each Topical Daily Martina Sinner, MD   6 each at 05/10/21 2139   cholecalciferol (VITAMIN D3) tablet 1,000 Units  1,000 Units Oral Daily Mannam, Praveen, MD   1,000 Units at 05/11/21 1000   cloZAPine (CLOZARIL) tablet 75 mg  75 mg Oral QHS  Clapacs, John T, MD   75 mg at 05/10/21 2138   docusate sodium (COLACE) capsule 200 mg  200 mg Oral BID [name], MD   200 mg at 05/11/21 1000   dorzolamide-timolol (COSOPT) 22.3-6.8 MG/ML ophthalmic solution 1 drop  1 drop Both Eyes BID Rust-Chester, Britton L, NP   1 drop at 05/11/21 0958   enoxaparin (LOVENOX) injection 40 mg  40 mg Subcutaneous Q24H Rust-Chester, Britton L, NP   40 mg at 05/10/21 2138   ezetimibe (ZETIA) tablet 10 mg  10 mg Oral QHS Mannam, Praveen, MD   10 mg at 05/10/21 2137   hydrALAZINE (APRESOLINE) injection 10 mg  10 mg Intravenous Q6H PRN Martina Sinner, MD   10 mg at 05/01/21 2102  hydrOXYzine (ATARAX/VISTARIL) tablet 25 mg  25 mg Oral TID PRN Lianne Cure, NP   25 mg at 05/10/21 2138   isosorbide mononitrate (IMDUR) 24 hr tablet 30 mg  30 mg Oral Daily Wieting, Richard, MD   30 mg at 05/11/21 1000   latanoprost (XALATAN) 0.005 % ophthalmic solution 1 drop  1 drop Both Eyes QHS Graves, Dana E, NP   1 drop at 05/10/21 2138   levalbuterol (XOPENEX) nebulizer solution 1.25 mg  1.25 mg Nebulization Q6H PRN Salena Saner, MD   1.25 mg at 05/07/21 9381   melatonin tablet 5 mg  5 mg Oral QHS Rust-Chester, Cecelia Byars, NP   5 mg at 05/10/21 2137   multivitamin with minerals tablet 1 tablet  1 tablet Oral Daily Mannam, Praveen, MD   1 tablet at 05/11/21 1000   nitroGLYCERIN (NITROSTAT) SL tablet 0.4 mg  0.4 mg Sublingual Q5 min PRN Lianne Cure, NP       pantoprazole (PROTONIX) EC tablet 40 mg  40 mg Oral Daily Mannam, Praveen, MD   40 mg at 05/11/21 1000   polyethylene glycol (MIRALAX / GLYCOLAX) packet 17 g  17 g Oral Daily PRN Rust-Chester, Cecelia Byars, NP       revefenacin (YUPELRI) nebulizer solution 175 mcg  175 mcg Nebulization Daily Martina Sinner, MD   175 mcg at 05/11/21 8299   senna (SENOKOT) tablet 8.6-17.2 mg  1-2 tablet Oral Daily PRN Lianne Cure, NP       tamsulosin (FLOMAX) capsule 0.4 mg  0.4 mg Oral Daily Lianne Cure, NP   0.4 mg  at 05/11/21 1000    Musculoskeletal: Strength & Muscle Tone: decreased Gait & Station: unsteady Patient leans: N/A  Psychiatric Specialty Exam: Physical Exam Vitals and nursing note reviewed.  Constitutional:      Appearance: Normal appearance.  HENT:     Head: Normocephalic and atraumatic.     Mouth/Throat:     Pharynx: Oropharynx is clear.  Eyes:     Pupils: Pupils are equal, round, and reactive to light.  Cardiovascular:     Rate and Rhythm: Normal rate and regular rhythm.  Pulmonary:     Effort: Pulmonary effort is normal.     Breath sounds: Normal breath sounds.  Abdominal:     General: Abdomen is flat.     Palpations: Abdomen is soft.  Musculoskeletal:        General: Normal range of motion.  Skin:    General: Skin is warm and dry.  Neurological:     General: No focal deficit present.     Mental Status: He is alert. Mental status is at baseline.  Psychiatric:        Attention and Perception: He is inattentive.        Mood and Affect: Mood is depressed. Affect is blunt.        Speech: He is noncommunicative.        Behavior: Behavior is withdrawn.        Thought Content: Thought content normal. Thought content does not include homicidal or suicidal ideation.        Cognition and Memory: Cognition is impaired.        Judgment: Judgment normal.    Review of Systems  Unable to perform ROS: Patient nonverbal  All other systems reviewed and are negative.  Blood pressure 103/76, pulse 93, temperature 97.9 F (36.6 C), resp. rate 20, height 6' (1.829 m), weight 74.3 kg, SpO2  100 %.Body mass index is 22.22 kg/m.  General Appearance: Casual  Eye Contact:  Good  Speech:  Negative  Volume:  Decreased, could not hear  Mood:  Euthymic  Affect:  Blunt  Thought Process:  Coherent and Goal Directed  Orientation: Self  Thought Content:  UTA, unable to assess  Suicidal Thoughts:  No  Homicidal Thoughts:  No  Memory:   UTA  Judgement:  Fair  Insight:  UTA  Psychomotor  Activity:  Decreased  Concentration:  Concentration: Fair and Attention Span: Fair  Recall:  [employer] of Knowledge:   UTA  Language:  Poor  Akathisia:  No  Handed:  Right  AIMS (if indicated):     Assets:  Housing Resilience  ADL's:  Intact  Cognition:  WNL  Sleep:        Physical Exam: Physical Exam Vitals and nursing note reviewed.  Constitutional:      Appearance: Normal appearance.  HENT:     Head: Normocephalic and atraumatic.     Mouth/Throat:     Pharynx: Oropharynx is clear.  Eyes:     Pupils: Pupils are equal, round, and reactive to light.  Cardiovascular:     Rate and Rhythm: Normal rate and regular rhythm.  Pulmonary:     Effort: Pulmonary effort is normal.     Breath sounds: Normal breath sounds.  Abdominal:     General: Abdomen is flat.     Palpations: Abdomen is soft.  Musculoskeletal:        General: Normal range of motion.  Skin:    General: Skin is warm and dry.  Neurological:     General: No focal deficit present.     Mental Status: He is alert. Mental status is at baseline.  Psychiatric:        Attention and Perception: He is inattentive.        Mood and Affect: Mood is depressed. Affect is blunt.        Speech: He is noncommunicative.        Behavior: Behavior is withdrawn.        Thought Content: Thought content normal. Thought content does not include homicidal or suicidal ideation.        Cognition and Memory: Cognition is impaired.        Judgment: Judgment normal.   Review of Systems  Unable to perform ROS: Patient nonverbal  All other systems reviewed and are negative. Blood pressure 103/76, pulse 93, temperature 97.9 F (36.6 C), resp. rate 20, height 6' (1.829 m), weight 74.3 kg, SpO2 100 %. Body mass index is 22.22 kg/m.  Treatment Plan Summary: Schizoaffective disorder, bipolar type: -continue Clozaril 75 mg daily -Continue Wellbutrin 100 mg daily  Disposition: No evidence of imminent risk to self or others at present.    Supportive therapy provided about ongoing stressors.  Nanine Means, NP 05/11/2021 12:39 PM

## 2021-05-11 NOTE — Progress Notes (Signed)
Patient ID: Brandon [name], male   DOB: 1956/01/17, 65 y.o.   MRN: [id number] [facility] PROGRESS NOTE  Brandon [name]:[id number] DOB: November 04, 1955 DOA: 04/28/2021 PCP: Sherron Monday, MD  HPI/Subjective: Patient's able to mouth words and tries to vocalize.  A little more confused with pointing to letters than he was yesterday.  No complaints of pain in his low back mid back or neck.  Still weak both legs right greater than left.  Objective: Vitals:   05/11/21 0915 05/11/21 1237  BP: 121/74 103/76  Pulse: 88 93  Resp: 20 20  Temp: 97.8 F (36.6 C) 97.9 F (36.6 C)  SpO2: 96% 100%    Intake/Output Summary (Last 24 hours) at 05/11/2021 1458 Last data filed at 05/11/2021 1412 Gross per 24 hour  Intake 180 ml  Output 700 ml  Net -520 ml   Filed Weights   05/09/21 0416 05/10/21 0338 05/11/21 0450  Weight: 73.9 kg 72.4 kg 74.3 kg    ROS: Review of Systems  Respiratory:  Negative for shortness of breath.   Cardiovascular:  Negative for chest pain.  Gastrointestinal:  Positive for constipation. Negative for abdominal pain.  Musculoskeletal:  Negative for back pain.  Exam: Physical Exam HENT:     Head: Normocephalic.     Mouth/Throat:     Pharynx: No oropharyngeal exudate.  Eyes:     General: Lids are normal.     Conjunctiva/sclera: Conjunctivae normal.  Cardiovascular:     Rate and Rhythm: Normal rate and regular rhythm.     Heart sounds: Normal heart sounds, S1 normal and S2 normal.  Pulmonary:     Breath sounds: Examination of the right-lower field reveals decreased breath sounds. Examination of the left-lower field reveals decreased breath sounds. Decreased breath sounds present. No wheezing, rhonchi or rales.  Abdominal:     Palpations: Abdomen is soft.     Tenderness: There is no abdominal tenderness.  Musculoskeletal:     Right lower leg: Swelling present.     Left lower leg: Swelling present.  Skin:    General: Skin is warm.     Findings: No rash.   Neurological:     Mental Status: He is alert.      Scheduled Meds:  arformoterol  15 mcg Nebulization BID   aspirin  81 mg Oral Daily   budesonide (PULMICORT) nebulizer solution  0.5 mg Nebulization BID   buPROPion ER  100 mg Oral Daily   Chlorhexidine Gluconate Cloth  6 each Topical Daily   cholecalciferol  1,000 Units Oral Daily   cloZAPine  75 mg Oral QHS   docusate sodium  200 mg Oral BID   dorzolamide-timolol  1 drop Both Eyes BID   enoxaparin (LOVENOX) injection  40 mg Subcutaneous Q24H   ezetimibe  10 mg Oral QHS   isosorbide mononitrate  30 mg Oral Daily   lactulose  30 g Oral TID   latanoprost  1 drop Both Eyes QHS   melatonin  5 mg Oral QHS   multivitamin with minerals  1 tablet Oral Daily   pantoprazole  40 mg Oral Daily   revefenacin  175 mcg Nebulization Daily   tamsulosin  0.4 mg Oral Daily   Continuous Infusions:  sodium chloride Stopped (05/04/21 1557)    Assessment/Plan:  Dysarthria with bilateral lower extremity weakness right greater than left.  MRI showing flair hyperintensity in the [place] bilaterally.  Possibly ischemia from hypotension Imdur hypoxia from admission.  Started aspirin.  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  Hold statin for right now.  Continue physical therapy.  CPK normal.  Spoke with brother and would rather hold off on imaging of the spine at this point. Acute metabolic encephalopathy with history of schizophrenia.  Continue clozapine and Wellbutrin. Acute on chronic hypoxic hypercapnic respiratory failure.  History of COPD.  Patient on nebulizer treatments. Orthostatic hypotension. Constipation start lactulose until bowel movement Acute on chronic hyponatremia.  Initial sodium 122.  Most recent sodium 132 BPH on Flomax Weakness.  Continue working with physical therapy.  Transitional care team still looking for rehab options     Code Status:     Code Status Orders  (From admission, onward)           Start      Ordered   05/03/21 1533  Do not attempt resuscitation (DNR)  Continuous       Question Answer Comment  In the event of cardiac or respiratory ARREST Do not call a "code blue"   In the event of cardiac or respiratory ARREST Do not perform Intubation, CPR, defibrillation or ACLS   In the event of cardiac or respiratory ARREST Use medication by any route, position, wound care, and other measures to relive pain and suffering. May use oxygen, suction and manual treatment of airway obstruction as needed for comfort.      05/03/21 1533           Code Status History     Date Active Date Inactive Code Status Order ID Comments User Context   04/28/2021 2115 05/03/2021 1533 Full Code [id number]  Rust-Chester, Cecelia Byars, NP ED   04/12/2021 2216 04/17/2021 1709 Full Code [id number]  Cox, Nadyne Coombes, DO ED   04/03/2021 2041 04/12/2021 0143 Full Code [id number]  Mansy, Vernetta Honey, MD ED   03/22/2021 1851 03/25/2021 2231 Full Code [id number]  Anselm Jungling, DO ED   11/27/2020 2331 12/03/2020 2155 Full Code [id number]  Acheampong, Genice Rouge, MD ED   07/17/2018 1249 07/20/2018 1726 Full Code [id number]  Sung Amabile, DO Inpatient   05/04/2018 2029 05/07/2018 1634 Full Code [id number]  Sung Amabile, DO Inpatient   04/08/2018 2011 04/12/2018 1441 Full Code [id number]  Sung Amabile, DO Inpatient   10/19/2017 1252 10/19/2017 1820 Full Code [id number]  Laurier Nancy, MD Inpatient      Family Communication: Spoke with patient's brother on the phone Disposition Plan: Status is: Inpatient  Dispo: The patient is from: Home              Anticipated d/c is to: Rehab              Patient currently can go out to rehab once bed available   Difficult to place patient.  Yes.  Time spent: 27 minutes  Jacy Howat [employer]

## 2021-05-11 NOTE — Plan of Care (Signed)

## 2021-05-12 ENCOUNTER — Inpatient Hospital Stay: Payer: Medicare Other

## 2021-05-12 DIAGNOSIS — J189 Pneumonia, unspecified organism: Secondary | ICD-10-CM | POA: Diagnosis not present

## 2021-05-12 DIAGNOSIS — R471 Dysarthria and anarthria: Secondary | ICD-10-CM | POA: Diagnosis not present

## 2021-05-12 DIAGNOSIS — G9341 Metabolic encephalopathy: Secondary | ICD-10-CM | POA: Diagnosis not present

## 2021-05-12 DIAGNOSIS — M722 Plantar fascial fibromatosis: Secondary | ICD-10-CM | POA: Diagnosis not present

## 2021-05-12 DIAGNOSIS — F2089 Other schizophrenia: Secondary | ICD-10-CM

## 2021-05-12 DIAGNOSIS — N400 Enlarged prostate without lower urinary tract symptoms: Secondary | ICD-10-CM

## 2021-05-12 LAB — PROCALCITONIN
Procalcitonin: <0.1 ng/mL
Procalcitonin: <0.1 ng/mL

## 2021-05-12 LAB — BRAIN NATRIURETIC PEPTIDE: B Natriuretic Peptide: 28.5 pg/mL (ref 0.0–100.0)

## 2021-05-12 LAB — URIC ACID: Uric Acid, Serum: 3.4 mg/dL — ABNORMAL LOW (ref 3.7–8.6)

## 2021-05-12 MED ORDER — SODIUM CHLORIDE 0.9 % IV SOLN: INTRAVENOUS | Status: DC

## 2021-05-12 MED ORDER — DILTIAZEM HCL ER COATED BEADS 120 MG PO CP24
120.0000 mg | ORAL_CAPSULE | Freq: Every day | ORAL | Status: DC
  Administered 2021-05-12 – 2021-05-17 (×6): 120 mg via ORAL
  Filled 2021-05-12 (×6): qty 1

## 2021-05-12 MED ORDER — SODIUM CHLORIDE 0.9 % IV BOLUS
250.0000 mL | Freq: Once | INTRAVENOUS | Status: AC
  Administered 2021-05-12: 250 mL via INTRAVENOUS

## 2021-05-12 MED ORDER — OXYCODONE HCL 5 MG PO TABS
5.0000 mg | ORAL_TABLET | Freq: Four times a day (QID) | ORAL | Status: DC | PRN
  Administered 2021-05-12 – 2021-05-15 (×7): 5 mg via ORAL
  Filled 2021-05-12 (×9): qty 1

## 2021-05-12 MED ORDER — PIPERACILLIN-TAZOBACTAM 3.375 G IVPB
3.3750 g | Freq: Three times a day (TID) | INTRAVENOUS | Status: DC
  Administered 2021-05-12 – 2021-05-14 (×6): 3.375 g via INTRAVENOUS
  Filled 2021-05-12 (×6): qty 50

## 2021-05-12 MED ORDER — SODIUM CHLORIDE 3 % IN NEBU
4.0000 mL | INHALATION_SOLUTION | Freq: Two times a day (BID) | RESPIRATORY_TRACT | Status: AC
Start: 2021-05-12 — End: 2021-05-14
  Administered 2021-05-12 – 2021-05-13 (×4): 4 mL via RESPIRATORY_TRACT
  Administered 2021-05-14: 15 mL via RESPIRATORY_TRACT
  Filled 2021-05-12 (×5): qty 4

## 2021-05-12 MED ORDER — FINASTERIDE 5 MG PO TABS
5.0000 mg | ORAL_TABLET | Freq: Every day | ORAL | Status: DC
  Administered 2021-05-13 – 2021-05-17 (×5): 5 mg via ORAL
  Filled 2021-05-12 (×5): qty 1

## 2021-05-12 MED ORDER — SODIUM CHLORIDE 3 % IN NEBU
4.0000 mL | INHALATION_SOLUTION | Freq: Two times a day (BID) | RESPIRATORY_TRACT | Status: DC
  Administered 2021-05-12: 4 mL via RESPIRATORY_TRACT
  Filled 2021-05-12 (×2): qty 4

## 2021-05-12 MED ORDER — METHYLPREDNISOLONE SODIUM SUCC 40 MG IJ SOLR
40.0000 mg | Freq: Every day | INTRAMUSCULAR | Status: DC
  Administered 2021-05-12 – 2021-05-16 (×5): 40 mg via INTRAVENOUS
  Filled 2021-05-12 (×5): qty 1

## 2021-05-12 NOTE — Progress Notes (Signed)
Pharmacy Antibiotic Note  Brandon [name] is a 65 y.o. male admitted on 04/28/2021 with pneumonia.  Pharmacy has been consulted for Zosyn dosing. His renal function appears to be stable and at his apparent baseline.  Plan:  start Zosyn 3.375g IV q8h (4 hour infusion).  Height: 6' (182.9 cm) Weight: 75 kg (165 lb 5.5 oz) IBW/kg (Calculated) : 77.6  Temp (24hrs), Avg:98.1 F (36.7 C), Min:97.7 F (36.5 C), Max:98.7 F (37.1 C)  Recent Labs  Lab 05/06/21 0512 05/07/21 0436 05/08/21 0505 05/09/21 0457 05/11/21 0457  WBC  --  7.2 6.3  --  7.6  CREATININE 0.41* 0.55* 0.43* 0.59* 0.48*    Estimated Creatinine Clearance: 97.7 mL/min (A) (by C-G formula based on SCr of 0.48 mg/dL (L)).    Allergies  Allergen Reactions   Compazine [Prochlorperazine] Other (See Comments)    Dystonic rxn - convulsions. Near fatal reaction.    Ivp Dye [Iodinated Diagnostic Agents] Shortness Of Breath    Pt states SOB after last IV contrast injection   Alcohol-Sulfur [Elemental Sulfur] Other (See Comments)    History of alcoholism   Benadryl [Diphenhydramine] Other (See Comments)    "stuffy", nasal congestion   Depakote [Divalproex Sodium] Other (See Comments)    Cause elevated ammonia   Dramamine [Dimenhydrinate] Swelling   Plavix [Clopidogrel] Other (See Comments)    Rectal bleeding. "Perforated my intestines."   Rosuvastatin     Other reaction(s): Other (See Comments)    Antimicrobials this admission: Zosyn 10/02 >>   Microbiology results: 09/18 BCx: NG final 09/18 UCx: NG final 09/27 SARS CoV-2: negative  09/19 MRSA PCR: negative  Thank you for allowing pharmacy to be a part of this patient's care.  Lowella Bandy 05/12/2021 9:58 AM

## 2021-05-12 NOTE — Progress Notes (Signed)
Patient ID: Brandon [name], male   DOB: [birth year], 65 y.o.   MRN: [id number] [facility] PROGRESS NOTE  BUCKLEY BRADLY WUJ:[id number] DOB: 1956/05/22 DOA: 04/28/2021 PCP: Sherron Monday, MD  HPI/Subjective: Patient told the nurse that he had some right foot pain.  In pressing on his foot, it on the bottom of his foot.  The patient is using his letters to try to choose words but could not understand what he was pointing at today.  Chest x-ray done overnight shows pneumonia.  Patient tachycardic today.  Still with constipation.  Objective: Vitals:   05/12/21 1220 05/12/21 1400  BP: 105/70   Pulse: (!) 119   Resp: 17 18  Temp: 97.8 F (36.6 C)   SpO2: 96% 98%    Intake/Output Summary (Last 24 hours) at 05/12/2021 1450 Last data filed at 05/12/2021 1407 Gross per 24 hour  Intake 480 ml  Output 600 ml  Net -120 ml    Filed Weights   05/10/21 0338 05/11/21 0450 05/12/21 0500  Weight: 72.4 kg 74.3 kg 75 kg    ROS: Review of Systems  Respiratory:  Positive for shortness of breath.   Cardiovascular:  Negative for chest pain.  Gastrointestinal:  Positive for constipation. Negative for abdominal pain.  Exam: Physical Exam HENT:     Head: Normocephalic.     Mouth/Throat:     Pharynx: No oropharyngeal exudate.  Eyes:     General: Lids are normal.     Conjunctiva/sclera: Conjunctivae normal.  Cardiovascular:     Rate and Rhythm: Regular rhythm. Tachycardia present.     Heart sounds: Normal heart sounds, S1 normal and S2 normal.  Pulmonary:     Breath sounds: Examination of the left-middle field reveals wheezing. Examination of the right-lower field reveals decreased breath sounds and rhonchi. Examination of the left-lower field reveals decreased breath sounds and rhonchi. Decreased breath sounds, wheezing and rhonchi present. No rales.  Abdominal:     Palpations: Abdomen is soft.     Tenderness: There is no abdominal tenderness.  Musculoskeletal:     Right lower leg:  Swelling present.     Left lower leg: Swelling present.  Skin:    General: Skin is warm.     Findings: No rash.  Neurological:     Mental Status: He is alert.     Comments: Shakes his head to some yes/no questions.  When he is pointing to letters difficult understand what he is trying to spell because he is pointing to a lot of consonants.      Scheduled Meds:  arformoterol  15 mcg Nebulization BID   aspirin  81 mg Oral Daily   budesonide (PULMICORT) nebulizer solution  0.5 mg Nebulization BID   buPROPion ER  100 mg Oral Daily   Chlorhexidine Gluconate Cloth  6 each Topical Daily   cholecalciferol  1,000 Units Oral Daily   cloZAPine  75 mg Oral QHS   diltiazem  120 mg Oral Daily   docusate sodium  200 mg Oral BID   dorzolamide-timolol  1 drop Both Eyes BID   enoxaparin (LOVENOX) injection  40 mg Subcutaneous Q24H   ezetimibe  10 mg Oral QHS   isosorbide mononitrate  30 mg Oral Daily   lactulose  30 g Oral TID   latanoprost  1 drop Both Eyes QHS   melatonin  5 mg Oral QHS   methylPREDNISolone (SOLU-MEDROL) injection  40 mg Intravenous Daily   multivitamin with minerals  1 tablet Oral  Daily   pantoprazole  40 mg Oral Daily   revefenacin  175 mcg Nebulization Daily   sodium chloride HYPERTONIC  4 mL Nebulization Q12H   tamsulosin  0.4 mg Oral Daily   Continuous Infusions:  sodium chloride Stopped (05/04/21 1557)   piperacillin-tazobactam (ZOSYN)  IV 3.375 g (05/12/21 1031)    Assessment/Plan:  Left lower lobe pneumonia, tachycardia.  We will give IV fluid bolus start Zosyn.  Start Solu-Medrol.   Right foot pain could be Planter fasciitis.  Solu-Medrol should help.  Uric acid low.   Dysarthria with bilateral lower extremity weakness right greater than left.  MRI shows flair intensity in the putamen bilaterally which likely is from prior ischemia from hypotension or hypoxia.   Acute metabolic encephalopathy with history of schizophrenia.  On clozapine and Wellbutrin.  Mental  status does wax and wane. Acute on chronic hypoxic hypercapnic respiratory failure.  Currently on 4-1/2 L of oxygen.  Respiratory status worsened overnight.  History of COPD.  Starting Solu-Medrol and antibiotics.  Continue nebulizer treatments. Sinus tachycardia.  IV fluid bolus and IV fluids.  We will try Cardizem CD. Constipation.  Lactulose until bowel movement.   Acute on chronic hyponatremia.  Initial sodium 122.  Most recent sodium is 132.   BPH.  Switch Flomax over to Proscar.  Patient has Foley catheter in.  With worsening status will hold off on removing Weakness.  Continue working with physical therapy and Occupational Therapy      Code Status:     Code Status Orders  (From admission, onward)           Start     Ordered   05/03/21 1533  Do not attempt resuscitation (DNR)  Continuous       Question Answer Comment  In the event of cardiac or respiratory ARREST Do not call a "code blue"   In the event of cardiac or respiratory ARREST Do not perform Intubation, CPR, defibrillation or ACLS   In the event of cardiac or respiratory ARREST Use medication by any route, position, wound care, and other measures to relive pain and suffering. May use oxygen, suction and manual treatment of airway obstruction as needed for comfort.      05/03/21 1533             Family Communication: Spoke with patient's brother on the phone Disposition Plan: Status is: Inpatient  Dispo: The patient is from: Home              Anticipated d/c is to: Rehab              Patient currently with worsening status today.  Starting antibiotics.   Difficult to place patient.  Yes.  Time spent: 29 minutes  Jeyren Danowski [employer]

## 2021-05-13 DIAGNOSIS — J189 Pneumonia, unspecified organism: Secondary | ICD-10-CM | POA: Diagnosis not present

## 2021-05-13 DIAGNOSIS — R471 Dysarthria and anarthria: Secondary | ICD-10-CM | POA: Diagnosis not present

## 2021-05-13 DIAGNOSIS — R000 Tachycardia, unspecified: Secondary | ICD-10-CM

## 2021-05-13 DIAGNOSIS — R29898 Other symptoms and signs involving the musculoskeletal system: Secondary | ICD-10-CM | POA: Diagnosis not present

## 2021-05-13 DIAGNOSIS — M722 Plantar fascial fibromatosis: Secondary | ICD-10-CM | POA: Diagnosis not present

## 2021-05-13 LAB — CBC WITH DIFFERENTIAL/PLATELET
Abs Immature Granulocytes: 0.08 10*3/uL — ABNORMAL HIGH (ref 0.00–0.07)
Basophils Absolute: 0 10*3/uL (ref 0.0–0.1)
Basophils Relative: 0 %
Eosinophils Absolute: 0 10*3/uL (ref 0.0–0.5)
Eosinophils Relative: 0 %
HCT: 36.8 % — ABNORMAL LOW (ref 39.0–52.0)
Hemoglobin: 12 g/dL — ABNORMAL LOW (ref 13.0–17.0)
Immature Granulocytes: 1 %
Lymphocytes Relative: 9 %
Lymphs Abs: 1 10*3/uL (ref 0.7–4.0)
MCH: 29 pg (ref 26.0–34.0)
MCHC: 32.6 g/dL (ref 30.0–36.0)
MCV: 88.9 fL (ref 80.0–100.0)
Monocytes Absolute: 0.9 10*3/uL (ref 0.1–1.0)
Monocytes Relative: 7 %
Neutro Abs: 9.6 10*3/uL — ABNORMAL HIGH (ref 1.7–7.7)
Neutrophils Relative %: 83 %
Platelets: 296 10*3/uL (ref 150–400)
RBC: 4.14 MIL/uL — ABNORMAL LOW (ref 4.22–5.81)
RDW: 13.7 % (ref 11.5–15.5)
WBC: 11.7 10*3/uL — ABNORMAL HIGH (ref 4.0–10.5)
nRBC: 0 % (ref 0.0–0.2)

## 2021-05-13 LAB — PROCALCITONIN: Procalcitonin: <0.1 ng/mL

## 2021-05-13 LAB — BASIC METABOLIC PANEL
Anion gap: 7 (ref 5–15)
BUN: 11 mg/dL (ref 8–23)
CO2: 29 mmol/L (ref 22–32)
Calcium: 8.8 mg/dL — ABNORMAL LOW (ref 8.9–10.3)
Chloride: 95 mmol/L — ABNORMAL LOW (ref 98–111)
Creatinine, Ser: 0.59 mg/dL — ABNORMAL LOW (ref 0.61–1.24)
GFR, Estimated: >60 mL/min (ref >60)
Glucose, Bld: 105 mg/dL — ABNORMAL HIGH (ref 70–99)
Potassium: 3.8 mmol/L (ref 3.5–5.1)
Sodium: 131 mmol/L — ABNORMAL LOW (ref 135–145)

## 2021-05-13 MED ORDER — ACETAMINOPHEN 325 MG PO TABS
650.0000 mg | ORAL_TABLET | Freq: Four times a day (QID) | ORAL | Status: DC | PRN
  Administered 2021-05-13 – 2021-05-17 (×2): 650 mg via ORAL
  Filled 2021-05-13 (×2): qty 2

## 2021-05-13 NOTE — Progress Notes (Signed)
Patient ID: Brandon [name], male   DOB: [birth year], 65 y.o.   MRN: [id number]  [facility] PROGRESS NOTE  Brandon [name]:[id number] DOB: June 09, 1956 DOA: 04/28/2021 PCP: Sherron Monday, MD  HPI/Subjective: Patient seen earlier today.  Stated he had a small bowel movement.  Still having a little foot pain.  Still with difficulty communicating and able to point to things.  Admitted 14 days ago with acute hypoxic respiratory failure and hypotension was intubated.  Objective: Vitals:   05/13/21 1159 05/13/21 1700  BP: 108/80 108/80  Pulse: 92 94  Resp: 17 17  Temp: 98.7 F (37.1 C) 98.7 F (37.1 C)  SpO2: 96% 96%    Intake/Output Summary (Last 24 hours) at 05/13/2021 1737 Last data filed at 05/13/2021 1629 Gross per 24 hour  Intake 1075.61 ml  Output 500 ml  Net 575.61 ml    Filed Weights   05/11/21 0450 05/12/21 0500 05/13/21 0539  Weight: 74.3 kg 75 kg 75.4 kg    ROS: Review of Systems  Respiratory:  Positive for shortness of breath.   Cardiovascular:  Negative for chest pain.  Gastrointestinal:  Positive for constipation. Negative for abdominal pain.  Musculoskeletal:  Positive for joint pain.  Exam: Physical Exam HENT:     Head: Normocephalic.     Mouth/Throat:     Pharynx: No oropharyngeal exudate.  Eyes:     General: Lids are normal.     Conjunctiva/sclera: Conjunctivae normal.  Cardiovascular:     Rate and Rhythm: Normal rate and regular rhythm.     Heart sounds: Normal heart sounds, S1 normal and S2 normal.  Pulmonary:     Breath sounds: Examination of the right-lower field reveals decreased breath sounds. Examination of the left-lower field reveals decreased breath sounds. Decreased breath sounds present. No wheezing, rhonchi or rales.  Abdominal:     Palpations: Abdomen is soft.     Tenderness: There is no abdominal tenderness.  Musculoskeletal:     Right lower leg: No swelling.     Left lower leg: No swelling.  Skin:    General: Skin is  warm.     Findings: No rash.  Neurological:     Mental Status: He is alert.     Comments: Shakes his head to yes or no questions.      Scheduled Meds:  arformoterol  15 mcg Nebulization BID   aspirin  81 mg Oral Daily   budesonide (PULMICORT) nebulizer solution  0.5 mg Nebulization BID   buPROPion ER  100 mg Oral Daily   Chlorhexidine Gluconate Cloth  6 each Topical Daily   cholecalciferol  1,000 Units Oral Daily   cloZAPine  75 mg Oral QHS   diltiazem  120 mg Oral Daily   docusate sodium  200 mg Oral BID   dorzolamide-timolol  1 drop Both Eyes BID   enoxaparin (LOVENOX) injection  40 mg Subcutaneous Q24H   ezetimibe  10 mg Oral QHS   finasteride  5 mg Oral Daily   isosorbide mononitrate  30 mg Oral Daily   lactulose  30 g Oral TID   latanoprost  1 drop Both Eyes QHS   melatonin  5 mg Oral QHS   methylPREDNISolone (SOLU-MEDROL) injection  40 mg Intravenous Daily   multivitamin with minerals  1 tablet Oral Daily   pantoprazole  40 mg Oral Daily   revefenacin  175 mcg Nebulization Daily   sodium chloride HYPERTONIC  4 mL Nebulization Q12H   Continuous Infusions:  sodium chloride Stopped (05/04/21 1557)   sodium chloride 50 mL/hr at 05/13/21 1635   piperacillin-tazobactam (ZOSYN)  IV 3.375 g (05/13/21 1636)    Assessment/Plan:  Left lower lobe pneumonia.  Continue Zosyn.  Continue Solu-Medrol.  Gentle IV fluids today.  We will get rid of them this afternoon. Right foot pain likely Planter fasciitis.  Solu-Medrol should help.  Added Tylenol for pain. Dysarthria with bilateral lower extremity weakness right greater than left.  After I moved his legs with passive range of motion he was able to move them a little bit on his own after that.  Previous MRI shows flair intensity in the Putamen bilaterally which is likely from prior ischemia from hypotension or hypoxia. Acute metabolic encephalopathy with history of schizophrenia.  Continue clozapine and Wellbutrin.  Mental status  does wax and wane and seems a little bit better today than yesterday. Acute on chronic hypoxic hypercapnic respiratory failure.  This morning was on 3 L of oxygen. Sinus tachycardia.  Gentle IV fluids.  Started Cardizem CD yesterday. Constipation.  Continue lactulose Hyponatremia Brandon [name] coming into the hospital 122.  Most recent sodium is 131. BPH.  Patient hesitant on taking Foley out today.  Continue Proscar.  We will try to take Foley out tomorrow morning. Weakness.  Continue working with PT and OT.  Transitional care team looking into rehabs.       Code Status:     Code Status Orders  (From admission, onward)           Start     Ordered   05/03/21 1533  Do not attempt resuscitation (DNR)  Continuous       Question Answer Comment  In the event of cardiac or respiratory ARREST Do not call a "code blue"   In the event of cardiac or respiratory ARREST Do not perform Intubation, CPR, defibrillation or ACLS   In the event of cardiac or respiratory ARREST Use medication by any route, position, wound care, and other measures to relive pain and suffering. May use oxygen, suction and manual treatment of airway obstruction as needed for comfort.      05/03/21 1533             Family Communication: Spoke with patient's brother at the bedside Disposition Plan: Status is: Inpatient  Dispo: The patient is from: Home              Anticipated d/c is to: Rehab              Patient currently just started antibiotics yesterday.  Day 2 of Zosyn   Difficult to place patient.  Yes.  Time spent: 27 minutes  Brandon [name] [employer]

## 2021-05-13 NOTE — Progress Notes (Signed)
Physical Therapy Treatment Patient Details Name: Brandon [name] MRN: [id number] DOB: 1955/12/24 Today's Date: 05/13/2021   History of Present Illness Pt is a 65 year old male with PMH including schizophrenia, seizures (ETOH withdrawal related), and ETOH abuse presenting to [facility] ED on 04/28/2021 from [facility] via EMS.  Staff at [facility] called EMS after patient was found to be unresponsive and reportedly hypotensive in the 50s for an unknown amount of time.  At [facility] patient became hypoxic with SPO2 in the 70s and was too confused to safely tolerate BiPAP requiring emergent intubation and mechanical ventilatory support. Pt was eventully extubated but needed to be reintubated 9/20 and was again extubated 9/21.  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    PT Comments    Pt was pleasant and motivated to participate during the session and put forth good effort throughout.  Pt continued to require +2 assist with bed mobility tasks but once in sitting was able to maintain static sitting position without physical assist. Pt continued to occasionally lean to the L while sitting but was able to correct with cuing.  Pt required +2 assist to come to standing but was able to take several very small lateral shuffling steps each of the two times he came to standing this session.  Pt required cues for BUE support on the RW to enable him to [facility] his LEs in order for him to shuffle and required some assistance for stability but this was good progress compared to his prior session.  Pt will benefit from PT services in a SNF setting upon discharge to safely address deficits listed in patient problem list  for decreased caregiver assistance and eventual return to PLOF.     Recommendations for follow up therapy are one component of a multi-disciplinary discharge planning process, led by the attending physician.  Recommendations may be updated based on patient status, additional functional criteria and insurance authorization.  Follow Up Recommendations  SNF;Supervision/Assistance - 24 hour     Equipment Recommendations  Other (comment) (TBD)    Recommendations for Other Services       Precautions / Restrictions Precautions Precautions: Fall Restrictions Weight Bearing Restrictions: No     Mobility  Bed Mobility Overal bed mobility: Needs Assistance Bed Mobility: Rolling;Sit to Sidelying;Sidelying to Sit Rolling: Mod assist Sidelying to sit: Mod assist;+2 for physical assistance     Sit to sidelying: Max assist;+2 for physical assistance General bed mobility comments: Heavy +2 assist for BLE and trunk control    Transfers Overall transfer level: Needs assistance Equipment used: Rolling walker (2 wheeled) Transfers: Sit to/from Stand Sit to Stand: Mod assist;+2 physical assistance;From elevated surface         General transfer comment: Mod verbal cues for sequencing  Ambulation/Gait Ambulation/Gait assistance: Min guard Gait Distance (Feet): 1 Feet Assistive device: Rolling walker (2 wheeled) Gait Pattern/deviations: Trunk flexed;Shuffle Gait velocity: decreased   General Gait Details: Pt able to shuffle BLE's a few inches each before fatiguing and needing to return to sitting with cues for general sequencing for increased BUE assistance while attempting to move feet   Stairs             Wheelchair Mobility    Modified [name] (Stroke Patients Only)  Balance Overall balance assessment: Needs assistance Sitting-balance support: Bilateral upper extremity supported;No upper extremity supported Sitting balance-[name] Scale: Fair Sitting balance -  Comments: Occasional verbal and tactile cues to correct L lateral lean but no physical assistance needed     Standing balance-[name] Scale: Poor Standing balance comment: Min to mod A for static standing balance                            Cognition Arousal/Alertness: Awake/alert Behavior During Therapy: WFL for tasks assessed/performed Overall Cognitive Status: Difficult to assess                                        Exercises Total Joint Exercises Ankle Circles/Pumps: AROM;Strengthening;Both;10 reps Short Arc Quad: AROM;AAROM;Strengthening;Both;5 reps;10 reps Heel Slides: AAROM;Strengthening;Both;AROM;5 reps Long Arc Quad: AROM;Strengthening;Both;10 reps;AAROM Other Exercises Other Exercises: Static sitting at EOB x 8-10 min for improved core strength and activity tolerance R lateral leaning in sitting to address mild L lateral instability     General Comments        Pertinent Vitals/Pain Pain Assessment: No/denies pain    Home Living                      Prior Function            PT Goals (current goals can now be found in the care plan section) Progress towards PT goals: Progressing toward goals    Frequency    Min 2X/week      PT Plan Current plan remains appropriate    Co-evaluation              AM-PAC PT "6 Clicks" Mobility   Outcome Measure  Help needed turning from your back to your side while in a flat bed without using bedrails?: A Lot Help needed moving from lying on your back to sitting on the side of a flat bed without using bedrails?: Total Help needed moving to and from a bed to a chair (including a wheelchair)?: Total Help needed standing up from a chair using your arms (e.g., wheelchair or bedside chair)?: A Lot Help needed to walk in hospital room?: Total Help needed climbing 3-5 steps with a railing? : Total 6 Click Score: 8    End of Session Equipment Utilized During Treatment: Gait  belt Activity Tolerance: Patient tolerated treatment well Patient left: in bed;with call bell/phone within reach;with bed alarm set Nurse Communication: Mobility status PT Visit Diagnosis: Unsteadiness on feet (R26.81);Muscle weakness (generalized) (M62.81);Difficulty in walking, not elsewhere classified (R26.2)     Time: 1529-1600 PT Time Calculation (min) (ACUTE ONLY): 31 min  Charges:  $Therapeutic Exercise: 8-22 mins $Therapeutic Activity: 8-22 mins                     D. Scott Shiraz Bastyr PT, DPT 05/13/21, 5:03 PM

## 2021-05-13 NOTE — Plan of Care (Signed)

## 2021-05-14 ENCOUNTER — Inpatient Hospital Stay: Payer: Medicare Other

## 2021-05-14 DIAGNOSIS — R471 Dysarthria and anarthria: Secondary | ICD-10-CM | POA: Diagnosis not present

## 2021-05-14 DIAGNOSIS — J9601 Acute respiratory failure with hypoxia: Secondary | ICD-10-CM | POA: Diagnosis not present

## 2021-05-14 DIAGNOSIS — G9341 Metabolic encephalopathy: Secondary | ICD-10-CM | POA: Diagnosis not present

## 2021-05-14 DIAGNOSIS — J189 Pneumonia, unspecified organism: Secondary | ICD-10-CM | POA: Diagnosis not present

## 2021-05-14 LAB — D-DIMER, QUANTITATIVE: D-Dimer, Quant: 0.54 ug/mL-FEU — ABNORMAL HIGH (ref 0.00–0.50)

## 2021-05-14 MED ORDER — BISACODYL 10 MG RE SUPP
10.0000 mg | Freq: Once | RECTAL | Status: AC
  Administered 2021-05-14: 10 mg via RECTAL
  Filled 2021-05-14: qty 1

## 2021-05-14 MED ORDER — TAMSULOSIN HCL 0.4 MG PO CAPS
0.4000 mg | ORAL_CAPSULE | Freq: Every day | ORAL | Status: DC
  Administered 2021-05-14 – 2021-05-17 (×4): 0.4 mg via ORAL
  Filled 2021-05-14 (×4): qty 1

## 2021-05-14 MED ORDER — FUROSEMIDE 10 MG/ML IJ SOLN
40.0000 mg | Freq: Once | INTRAMUSCULAR | Status: AC
  Administered 2021-05-14: 40 mg via INTRAVENOUS
  Filled 2021-05-14: qty 4

## 2021-05-14 MED ORDER — AMOXICILLIN-POT CLAVULANATE 875-125 MG PO TABS
1.0000 | ORAL_TABLET | Freq: Two times a day (BID) | ORAL | Status: DC
  Administered 2021-05-14 – 2021-05-15 (×4): 1 via ORAL
  Filled 2021-05-14 (×4): qty 1

## 2021-05-14 MED ORDER — HYDROCORTISONE SOD SUC (PF) 250 MG IJ SOLR
200.0000 mg | Freq: Once | INTRAMUSCULAR | Status: DC
  Filled 2021-05-14: qty 200

## 2021-05-14 NOTE — Progress Notes (Signed)
Patient ID: Brandon [name], male   DOB: October 17, 1955, 65 y.o.   MRN: [id number] [facility] PROGRESS NOTE  Brandon [name] OHY:[id number] DOB: Sep 26, 1955 DOA: 04/28/2021 PCP: Sherron Monday, MD  HPI/Subjective: Patient seen this morning his friend was at the bedside and was okay to speak medically in front of her.  Patient does have some cough and some shortness of breath.  Objective: Vitals:   05/14/21 1140 05/14/21 1601  BP: 132/85 124/88  Pulse: 91 (!) 109  Resp: 18 18  Temp: 97.7 F (36.5 C) 98 F (36.7 C)  SpO2: 95% 97%    Intake/Output Summary (Last 24 hours) at 05/14/2021 1726 Last data filed at 05/14/2021 1408 Gross per 24 hour  Intake 580.04 ml  Output 1600 ml  Net -1019.96 ml   Filed Weights   05/11/21 0450 05/12/21 0500 05/13/21 0539  Weight: 74.3 kg 75 kg 75.4 kg    ROS: Review of Systems  Respiratory:  Positive for cough and shortness of breath.   Cardiovascular:  Negative for chest pain.  Gastrointestinal:  Negative for abdominal pain, nausea and vomiting.  Exam: Physical Exam HENT:     Head: Normocephalic.     Mouth/Throat:     Pharynx: No oropharyngeal exudate.  Eyes:     General: Lids are normal.     Conjunctiva/sclera: Conjunctivae normal.  Cardiovascular:     Rate and Rhythm: Normal rate and regular rhythm.     Heart sounds: Normal heart sounds, S1 normal and S2 normal.  Pulmonary:     Effort: Accessory muscle usage present.     Breath sounds: Examination of the right-lower field reveals decreased breath sounds and rales. Examination of the left-lower field reveals decreased breath sounds and rales. Decreased breath sounds and rales present. No wheezing or rhonchi.  Abdominal:     Palpations: Abdomen is soft.     Tenderness: There is no abdominal tenderness.  Musculoskeletal:     Right lower leg: Swelling present.     Left lower leg: Swelling present.  Skin:    General: Skin is warm.     Findings: No rash.  Neurological:     Mental  Status: He is alert.     Comments: Able to answer some simple yes or no questions by shaking his head.  Hard to tell what words he is trying to express when he is pointing to letters      Scheduled Meds:  amoxicillin-clavulanate  1 tablet Oral Q12H   arformoterol  15 mcg Nebulization BID   aspirin  81 mg Oral Daily   budesonide (PULMICORT) nebulizer solution  0.5 mg Nebulization BID   buPROPion ER  100 mg Oral Daily   Chlorhexidine Gluconate Cloth  6 each Topical Daily   cholecalciferol  1,000 Units Oral Daily   cloZAPine  75 mg Oral QHS   diltiazem  120 mg Oral Daily   docusate sodium  200 mg Oral BID   dorzolamide-timolol  1 drop Both Eyes BID   enoxaparin (LOVENOX) injection  40 mg Subcutaneous Q24H   ezetimibe  10 mg Oral QHS   finasteride  5 mg Oral Daily   furosemide  40 mg Intravenous Once   isosorbide mononitrate  30 mg Oral Daily   lactulose  30 g Oral TID   latanoprost  1 drop Both Eyes QHS   melatonin  5 mg Oral QHS   methylPREDNISolone (SOLU-MEDROL) injection  40 mg Intravenous Daily   multivitamin with minerals  1 tablet  Oral Daily   pantoprazole  40 mg Oral Daily   revefenacin  175 mcg Nebulization Daily   tamsulosin  0.4 mg Oral QPC breakfast   Continuous Infusions:  sodium chloride Stopped (05/04/21 1557)   Brief history.  65 year old man history of COPD chronic hypoxic respiratory failure, CAD, hyperlipidemia hypertension schizophrenia and seizure and hyponatremia.  Initially admitted 15 days ago under the critical care service and was intubated for acute hypoxic respiratory failure and also hypotensive.  Patient also had a sodium of 122.  The patient has been unable to speak since being extubated.  I did get ENT to see but he refused laryngoscope.  MRI of the brain did show T2 and flair hyperintensity in the [place] bilaterally which may be areas of recent ischemia which could correlate with hypoxia episode.  The patient has had bilateral leg weakness.  I  restarted antibiotics on 05/12/2021 secondary to pneumonia on chest x-ray.  Patient is using some accessory muscles to breathe.  Will obtain a CT scan of the chest.  Assessment/Plan:  Acute on chronic hypoxic hypercapnic respiratory failure.  The patient was intubated and extubated during the hospital course.  Patient is now a DO NOT RESUSCITATE.  Today using a some accessory muscles to breathe.  IV fluids discontinued a dose of Lasix ordered x-ray ordered.  Will likely get a CAT scan of the chest also to rule out PE. Left lower lobe pneumonia.  Patient was on Zosyn for 2 days we will switch over to Augmentin.  Continue Solu-Medrol.  Discontinue IV fluids Right foot pain likely Planter fasciitis.  Solu-Medrol should help.  Added Tylenol as needed for pain. Dysarthria with bilateral lower extremity weakness left greater than right.  Patient still unable to talk since extubation.  MRI shows flair intensity in the [place] bilaterally.  Patient started on aspirin.  I discontinued statin which may be contributing to the patient's lower extremity weakness.  Patient able to move his legs after you do passive range of motion with him.  Still very weak. Sinus tachycardia.  On low-dose Cardizem CD Constipation on lactulose.  We will give Dulcolax suppository.  Abdominal x-ray shows quite a bit of stool burden in the abdomen. Hyponatremia.  Upon coming into the hospital sodium was 122.  Last sodium 131. BPH.  Patient has Foley.  On Flomax and Proscar.  Try to remove Foley and see if he urinates. Weakness.  Continue working with PT and OT.  Transitional care team looking into rehabs. Brother wants to meet again with palliative care and talk about potential hospice.        Code Status:     Code Status Orders  (From admission, onward)           Start     Ordered   05/03/21 1533  Do not attempt resuscitation (DNR)  Continuous       Question Answer Comment  In the event of cardiac or respiratory  ARREST Do not call a "code blue"   In the event of cardiac or respiratory ARREST Do not perform Intubation, CPR, defibrillation or ACLS   In the event of cardiac or respiratory ARREST Use medication by any route, position, wound care, and other measures to relive pain and suffering. May use oxygen, suction and manual treatment of airway obstruction as needed for comfort.      05/03/21 1533           Code Status History     Date Active Date Inactive  Code Status Order ID Comments User Context   04/28/2021 2115 05/03/2021 1533 Full Code [id number]  Rust-Chester, Cecelia Byars, NP ED   04/12/2021 2216 04/17/2021 1709 Full Code [id number]  CoxNadyne Coombes, DO ED   04/03/2021 2041 04/12/2021 0143 Full Code [id number]  Mansy, Vernetta Honey, MD ED   03/22/2021 1851 03/25/2021 2231 Full Code [id number]  Benita Gutter T, DO ED   11/27/2020 2331 12/03/2020 2155 Full Code [id number]  Acheampong, Genice Rouge, MD ED   07/17/2018 1249 07/20/2018 1726 Full Code [id number]  Sung Amabile, DO Inpatient   05/04/2018 2029 05/07/2018 1634 Full Code [id number]  Sung Amabile, DO Inpatient   04/08/2018 2011 04/12/2018 1441 Full Code [id number]  Sung Amabile, DO Inpatient   10/19/2017 1252 10/19/2017 1820 Full Code [id number]  Laurier Nancy, MD Inpatient      Family Communication: Spoke with Brother Disposition Plan: Status is: Inpatient  Dispo: The patient is from: Home              Anticipated d/c is to: Rehab versus brother now thinking about hospice              Patient currently has not improved much since coming out of the ICU still cannot talk and limited movement with his legs.  Started antibiotics over the weekend.  We will get a CAT scan of the chest rule out PE.   Difficult to place patient.  Yes.  Antibiotics: Augmentin  Time spent: 27 minutes  Brandon [name] [employer]

## 2021-05-14 NOTE — Progress Notes (Signed)
Physical Therapy Treatment Patient Details Name: Brandon [name] MRN: [id number] DOB: [birth year] Today's Date: 05/14/2021   History of Present Illness Pt is a 65 year old male with PMH including schizophrenia, seizures (ETOH withdrawal related), and ETOH abuse presenting to [facility] ED on 04/28/2021 from [facility] via EMS.  Staff at [facility] called EMS after patient was found to be unresponsive and reportedly hypotensive in the 50s for an unknown amount of time.  At [facility] patient became hypoxic with SPO2 in the 70s and was too confused to safely tolerate BiPAP requiring emergent intubation and mechanical ventilatory support. Pt was eventully extubated but needed to be reintubated 9/20 and was again extubated 9/21.  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    PT Comments    Pt was pleasant and motivated to participate during the session and put forth good effort throughout.  Pt continued to require extensive physical assistance with functional tasks but did present with improved static sitting balance at the EOB.  Pt was unable to advance either LE this session despite +2 assist and heavy lean on the RW but was able to stand statically x 5 and demonstrated improved eccentric control while sitting. Pt will benefit from PT services in a SNF setting upon discharge to safely address deficits listed in patient problem list for decreased caregiver assistance and eventual return to PLOF.     Recommendations for follow up therapy are one component of a multi-disciplinary discharge planning process, led by the attending physician.  Recommendations may be updated based on patient status, additional  functional criteria and insurance authorization.  Follow Up Recommendations  SNF;Supervision/Assistance - 24 hour     Equipment Recommendations  Other (comment) (TBD)    Recommendations for Other Services       Precautions / Restrictions Precautions Precautions: Fall Restrictions Weight Bearing Restrictions: No     Mobility  Bed Mobility Overal bed mobility: Needs Assistance Bed Mobility: Rolling;Sit to Sidelying;Sidelying to Sit Rolling: Mod assist Sidelying to sit: Mod assist;+2 for physical assistance     Sit to sidelying: Max assist;+2 for physical assistance General bed mobility comments: Heavy +2 assist for BLE and trunk control    Transfers Overall transfer level: Needs assistance Equipment used: Rolling walker (2 wheeled) Transfers: Sit to/from Stand Sit to Stand: Mod assist;+2 physical assistance;From elevated surface         General transfer comment: Mod verbal and tactile cues for sequencing with some physical assistance for foot placement  Ambulation/Gait             General Gait Details: Unable to advance/shuflle either LE this session   Stairs             Wheelchair Mobility    Modified [name] (Stroke Patients Only)       Balance Overall balance assessment: Needs assistance Sitting-balance support: Bilateral upper extremity supported;Single extremity supported Sitting balance-[name] Scale: Fair Sitting balance - Comments: Improved static sitting balance this session with very little lateral lean     Standing balance-[name] Scale: Poor Standing balance comment: Min to mod A for static standing balance                            Cognition Arousal/Alertness: Awake/alert Behavior  During Therapy: WFL for tasks assessed/performed Overall Cognitive Status: Within Functional Limits for tasks assessed                                 General Comments: Improved communication with letter board       Exercises Total Joint Exercises Ankle Circles/Pumps: AROM;Strengthening;Both;10 reps Quad Sets: Strengthening;Both;10 reps Gluteal Sets: Strengthening;Both;10 reps Short Arc Quad: AAROM;Strengthening;Both;10 reps Heel Slides: AAROM;Strengthening;Both;10 reps Hip ABduction/ADduction: Both;Strengthening;AAROM;10 reps Straight Leg Raises: AAROM;Strengthening;Both;10 reps Long Arc Quad: Strengthening;Both;10 reps;AAROM Other Exercises Other Exercises: Static sitting at EOB x 8-10 min for improved core strength and activity tolerance Other Exercises: HEP education for BLE APs, QS, and GS x 10 each every 1-2 hours daily Sit to/from stand x 5 with cues for controlled eccentric phase    General Comments        Pertinent Vitals/Pain Pain Assessment: 0-10 Pain Score: 9  Pain Location: abdomen Pain Descriptors / Indicators: Aching Pain Intervention(s): Monitored during session;Repositioned;Patient requesting pain meds-RN notified    Home Living                      Prior Function            PT Goals (current goals can now be found in the care plan section) Progress towards PT goals: Progressing toward goals    Frequency    Min 2X/week      PT Plan Current plan remains appropriate    Co-evaluation              AM-PAC PT "6 Clicks" Mobility   Outcome Measure  Help needed turning from your back to your side while in a flat bed without using bedrails?: A Lot Help needed moving from lying on your back to sitting on the side of a flat bed without using bedrails?: Total Help needed moving to and from a bed to a chair (including a wheelchair)?: Total Help needed standing up from a chair using your arms (e.g., wheelchair or bedside chair)?: A Lot Help needed to walk in hospital room?: Total Help needed climbing 3-5 steps with a railing? : Total 6 Click Score: 8    End of Session Equipment Utilized During Treatment: Gait belt Activity Tolerance: Patient  tolerated treatment well Patient left: in bed;with call bell/phone within reach;with bed alarm set Nurse Communication: Mobility status PT Visit Diagnosis: Unsteadiness on feet (R26.81);Muscle weakness (generalized) (M62.81);Difficulty in walking, not elsewhere classified (R26.2)     Time: 1914-7829 PT Time Calculation (min) (ACUTE ONLY): 40 min  Charges:  $Therapeutic Exercise: 23-37 mins $Therapeutic Activity: 8-22 mins                     D. Scott Wallis Vancott PT, DPT 05/14/21, 1:17 PM

## 2021-05-14 NOTE — Plan of Care (Signed)

## 2021-05-14 NOTE — Progress Notes (Signed)
Occupational Therapy Treatment Patient Details Name: Brandon [name] MRN: [id number] DOB: 1955/09/06 Today's Date: 05/14/2021   History of present illness Pt is a 65 year old male with PMH including schizophrenia, seizures (ETOH withdrawal related), and ETOH abuse presenting to [facility] ED on 04/28/2021 from SN[facility] via EMS.  Staff at [facility] called EMS after patient was found to be unresponsive and reportedly hypotensive in the 50s for an unknown amount of time.  At [facility] patient became hypoxic with SPO2 in the 70s and was too confused to safely tolerate BiPAP requiring emergent intubation and mechanical ventilatory support. Pt was eventully extubated but needed to be reintubated 9/20 and was again extubated 9/21.  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   OT comments  Brandon [name] was seen for OT treatment on this date. Upon arrival to room pt was awake/alert and in bed. Pt very agreeable to tx.  Pt required MOD A for UB bathing seated EOB - assist for sitting balance. MIN A for don/doff gown seated EOB. Pt tolerated ~25 min dynamic/static sitting at EOB - intermittent CGA for static sitting decreasing to MOD A for dynamic tasks including use of commnication board. Pt communicated ADL request to "make me pretty"  using communication board. MOD I + setup for urinal use at bedlevel.  Pt making good progress toward goals. Pt continues to benefit from skilled OT services to maximize return to PLOF and minimize risk of future falls, injury, caregiver burden, and readmission. Will continue to follow POC. Discharge recommendation remains appropriate.     Recommendations for follow up therapy are  one component of a multi-disciplinary discharge planning process, led by the attending physician.  Recommendations may be updated based on patient status, additional functional criteria and insurance authorization.    Follow Up Recommendations  SNF;Supervision/Assistance - 24 hour    Equipment Recommendations  Other (comment)    Recommendations for Other Services      Precautions / Restrictions Precautions Precautions: Fall Restrictions Weight Bearing Restrictions: No       Mobility Bed Mobility Overal bed mobility: Needs Assistance Bed Mobility: Supine to Sit;Sit to Supine Supine to sit: Max assist;+2 for physical assistance Sit to supine: Max assist;+2 for physical assistance    Transfers Overall transfer level: Needs assistance Equipment used: Rolling walker (2 wheeled) Transfers: Sit to/from Stand Sit to Stand: Max assist;+2 physical assistance            Balance Overall balance assessment: Needs assistance Sitting-balance support: Feet supported;Bilateral upper extremity supported Sitting balance-[name] Scale: Fair Sitting balance - Comments: Improved static sitting balance this session with very little lateral lean Postural control: Posterior lean;Right lateral lean Standing balance support: Bilateral upper extremity supported Standing balance-[name] Scale: Poor                            ADL either performed or assessed with clinical judgement   ADL Overall ADL's : Needs assistance/impaired                                       General ADL Comments: MOD A for UB bathing seated EOB - assist for sitting  balance. MIN A for don/doff gown seated EOB. Pt tolerated ~25 min dynamic/static sitting at EOB including use of commnication board - internittent CGA for static sitting decreasing to MOD A for dynamic tasks. MOD I + setup for urinal use at bedlevel.     Cognition Arousal/Alertness: Awake/alert Behavior During Therapy: WFL for  tasks assessed/performed Overall Cognitive Status: Within Functional Limits for tasks assessed                                 General Comments: Pt used ABC board to spell "make me pretty" when asked for ADL needs, demonstrating communication skills with humor        Exercises Exercises: Other exercises Other Exercises: Pt educ re: OT role, falls prevention, safe DME use Other Exercises: sup<>sit, sit<>stand x 2, sitting balance ~ 25 min, facewashing, don/doff gown, communication board for ADL selection, urinal use   Shoulder Instructions       General Comments      Pertinent Vitals/ Pain       Pain Assessment: Faces  Faces Pain Scale: Hurts a little bit Pain Location: chest c coughing Pain Descriptors / Indicators: Aching Pain Intervention(s): Monitored during session;Repositioned   Frequency  Min 2X/week        Progress Toward Goals  OT Goals(current goals can now be found in the care plan section)  Progress towards OT goals: Progressing toward goals  Acute Rehab OT Goals Patient Stated Goal: to have more therapy OT Goal Formulation: With patient Time For Goal Achievement: 05/18/21 Potential to Achieve Goals: Good ADL Goals Pt Will Perform Grooming: with min assist;sitting Pt Will Perform Lower Body Dressing: with mod assist;with max assist;sitting/lateral leans;with adaptive equipment Pt Will Transfer to Toilet: squat pivot transfer;with max assist;bedside commode Pt Will Perform Toileting - Clothing Manipulation and hygiene: with mod assist;with max assist;sitting/lateral leans Pt/caregiver will Perform Home Exercise Program: Increased strength;Both right and left upper extremity;With minimal assist Additional ADL Goal #1: Pt will demo F static sitting balance with UE support for 3 minutes EOB to improve trunk strength.  Plan Discharge plan remains appropriate;Frequency remains appropriate    Co-evaluation                 AM-PAC OT "6  Clicks" Daily Activity     Outcome Measure   Help from another person eating meals?: A Little Help from another person taking care of personal grooming?: A Little Help from another person toileting, which includes using toliet, bedpan, or urinal?: A Lot Help from another person bathing (including washing, rinsing, drying)?: A Lot Help from another person to put on and taking off regular upper body clothing?: A Little Help from another person to put on and taking off regular lower body clothing?: A Lot 6 Click Score: 15    End of Session Equipment Utilized During Treatment: Rolling walker;Oxygen (3L [place])  OT Visit Diagnosis: Unsteadiness on feet (R26.81);Muscle weakness (generalized) (M62.81)   Activity Tolerance Patient tolerated treatment well   Patient Left in bed;with call bell/phone within reach;with bed alarm set;with SCD's reapplied   Nurse Communication          Time: 0932-3557 OT Time Calculation (min): 43 min  Charges: OT General Charges $OT Visit: 1 Visit OT Treatments $Self Care/Home Management : 23-37 mins $Therapeutic Activity: 8-22 mins  [facility], OTS   [facility] 05/14/2021, 4:13 PM

## 2021-05-15 ENCOUNTER — Inpatient Hospital Stay: Payer: Medicare Other

## 2021-05-15 DIAGNOSIS — Z66 Do not resuscitate: Secondary | ICD-10-CM | POA: Diagnosis not present

## 2021-05-15 DIAGNOSIS — J9622 Acute and chronic respiratory failure with hypercapnia: Secondary | ICD-10-CM | POA: Diagnosis not present

## 2021-05-15 DIAGNOSIS — J9621 Acute and chronic respiratory failure with hypoxia: Secondary | ICD-10-CM | POA: Diagnosis not present

## 2021-05-15 DIAGNOSIS — F2089 Other schizophrenia: Secondary | ICD-10-CM | POA: Diagnosis not present

## 2021-05-15 DIAGNOSIS — J441 Chronic obstructive pulmonary disease with (acute) exacerbation: Secondary | ICD-10-CM | POA: Diagnosis not present

## 2021-05-15 DIAGNOSIS — F250 Schizoaffective disorder, bipolar type: Secondary | ICD-10-CM | POA: Diagnosis not present

## 2021-05-15 LAB — BASIC METABOLIC PANEL
Anion gap: 8 (ref 5–15)
BUN: 11 mg/dL (ref 8–23)
CO2: 34 mmol/L — ABNORMAL HIGH (ref 22–32)
Calcium: 9.1 mg/dL (ref 8.9–10.3)
Chloride: 92 mmol/L — ABNORMAL LOW (ref 98–111)
Creatinine, Ser: 0.32 mg/dL — ABNORMAL LOW (ref 0.61–1.24)
GFR, Estimated: >60 mL/min (ref >60)
Glucose, Bld: 99 mg/dL (ref 70–99)
Potassium: 3.6 mmol/L (ref 3.5–5.1)
Sodium: 134 mmol/L — ABNORMAL LOW (ref 135–145)

## 2021-05-15 LAB — CBC WITH DIFFERENTIAL/PLATELET
Abs Immature Granulocytes: 0.07 10*3/uL (ref 0.00–0.07)
Basophils Absolute: 0 10*3/uL (ref 0.0–0.1)
Basophils Relative: 0 %
Eosinophils Absolute: 0 10*3/uL (ref 0.0–0.5)
Eosinophils Relative: 0 %
HCT: 37.3 % — ABNORMAL LOW (ref 39.0–52.0)
Hemoglobin: 11.8 g/dL — ABNORMAL LOW (ref 13.0–17.0)
Immature Granulocytes: 1 %
Lymphocytes Relative: 11 %
Lymphs Abs: 1.1 10*3/uL (ref 0.7–4.0)
MCH: 28.2 pg (ref 26.0–34.0)
MCHC: 31.6 g/dL (ref 30.0–36.0)
MCV: 89 fL (ref 80.0–100.0)
Monocytes Absolute: 0.8 10*3/uL (ref 0.1–1.0)
Monocytes Relative: 7 %
Neutro Abs: 8.2 10*3/uL — ABNORMAL HIGH (ref 1.7–7.7)
Neutrophils Relative %: 81 %
Platelets: 322 10*3/uL (ref 150–400)
RBC: 4.19 MIL/uL — ABNORMAL LOW (ref 4.22–5.81)
RDW: 13.8 % (ref 11.5–15.5)
WBC: 10.1 10*3/uL (ref 4.0–10.5)
nRBC: 0 % (ref 0.0–0.2)

## 2021-05-15 LAB — PROCALCITONIN: Procalcitonin: <0.1 ng/mL

## 2021-05-15 MED ORDER — LACTULOSE 10 GM/15ML PO SOLN: 30.0000 g | Freq: Three times a day (TID) | ORAL | Status: DC | PRN

## 2021-05-15 MED ORDER — TECHNETIUM TO 99M ALBUMIN AGGREGATED
4.0000 | Freq: Once | INTRAVENOUS | Status: AC | PRN
  Administered 2021-05-15: 4 via INTRAVENOUS

## 2021-05-15 NOTE — Progress Notes (Signed)
Physical Therapy Treatment Patient Details Name: Brandon [name] MRN: [id number] DOB: [birth year] Today's Date: 05/15/2021   History of Present Illness Pt is a 65 year old male with PMH including schizophrenia, seizures (ETOH withdrawal related), and ETOH abuse presenting to [facility] ED on 04/28/2021 from [facility] via EMS.  Staff at [facility] called EMS after patient was found to be unresponsive and reportedly hypotensive in the 50s for an unknown amount of time.  At [facility] patient became hypoxic with SPO2 in the 70s and was too confused to safely tolerate BiPAP requiring emergent intubation and mechanical ventilatory support. Pt was eventully extubated but needed to be reintubated 9/20 and was again extubated 9/21.  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    PT Comments    Pt was pleasant and motivated to participate during the session but continued to be limited by significant functional weakness.  Pt presented with decreased static sitting balance this session compared to prior session and required occasional min A to prevent posterior and L lateral LOB.  Pt's static sitting balance improved grossly after anterior and R lateral weight shifting activities.  Pt required significant physical assistance to come to standing and was unable to advance or shuffle either LE even with heavy +2 assist. Pt will benefit from PT services in a SNF setting upon discharge to safely address deficits listed in patient problem list for decreased caregiver assistance and eventual return to PLOF.     Recommendations for follow up therapy are one component of a multi-disciplinary discharge planning process, led by  the attending physician.  Recommendations may be updated based on patient status, additional functional criteria and insurance authorization.  Follow Up Recommendations  SNF;Supervision/Assistance - 24 hour     Equipment Recommendations  Other (comment)    Recommendations for Other Services       Precautions / Restrictions Precautions Precautions: Fall Restrictions Weight Bearing Restrictions: No     Mobility  Bed Mobility Overal bed mobility: Needs Assistance   Rolling: Mod assist Sidelying to sit: Mod assist;+2 for physical assistance     Sit to sidelying: Max assist;+2 for physical assistance General bed mobility comments: Heavy +2 assist for BLE and trunk control    Transfers Overall transfer level: Needs assistance Equipment used: Rolling walker (2 wheeled) Transfers: Sit to/from Stand Sit to Stand: Max assist;+2 physical assistance;Mod assist;From elevated surface         General transfer comment: Mod verbal and tactile cues for sequencing with some physical assistance for foot placement  Ambulation/Gait             General Gait Details: Unable to advance/shuflle either LE this session   Stairs             Wheelchair Mobility    Modified [name] (Stroke Patients Only)       Balance Overall balance assessment: Needs assistance Sitting-balance support: Feet supported;Bilateral upper extremity supported Sitting balance-[name] Scale: Poor Sitting balance - Comments: Pt presented with decreased static sitting balance this session with L lateral and posterior instability that improved somewhat with weight shifting activities Postural control: Posterior lean;Left lateral lean Standing balance support: Bilateral upper extremity supported Standing balance-[name] Scale: Poor Standing balance comment: Min to mod A for static standing balance  Cognition Arousal/Alertness: Awake/alert Behavior During Therapy:  WFL for tasks assessed/performed Overall Cognitive Status: Difficult to assess                                        Exercises Total Joint Exercises Ankle Circles/Pumps: AROM;Strengthening;Both;10 reps;5 reps Quad Sets: Strengthening;Both;10 reps Gluteal Sets: Strengthening;Both;10 reps Short Arc Quad: AAROM;Strengthening;Both;10 reps Heel Slides: AAROM;Strengthening;Both;10 reps Hip ABduction/ADduction: Both;Strengthening;AAROM;10 reps Straight Leg Raises: AAROM;Strengthening;Both;10 reps Long Arc Quad: Strengthening;Both;10 reps;AAROM Other Exercises Other Exercises: Anterior and R lateral weight shifting in sitting to address posterior and L lateral instability    General Comments        Pertinent Vitals/Pain Pain Assessment: No/denies pain    Home Living                      Prior Function            PT Goals (current goals can now be found in the care plan section) Progress towards PT goals: Not progressing toward goals - comment (Limited by profound functional weakness)    Frequency    Min 2X/week      PT Plan Current plan remains appropriate    Co-evaluation              AM-PAC PT "6 Clicks" Mobility   Outcome Measure  Help needed turning from your back to your side while in a flat bed without using bedrails?: A Lot Help needed moving from lying on your back to sitting on the side of a flat bed without using bedrails?: Total Help needed moving to and from a bed to a chair (including a wheelchair)?: Total Help needed standing up from a chair using your arms (e.g., wheelchair or bedside chair)?: Total Help needed to walk in hospital room?: Total Help needed climbing 3-5 steps with a railing? : Total 6 Click Score: 7    End of Session Equipment Utilized During Treatment: Gait belt;Oxygen Activity Tolerance: Patient tolerated treatment well Patient left: in bed;with call bell/phone within reach;with bed alarm set Nurse  Communication: Mobility status PT Visit Diagnosis: Unsteadiness on feet (R26.81);Muscle weakness (generalized) (M62.81);Difficulty in walking, not elsewhere classified (R26.2)     Time: 1761-6073 PT Time Calculation (min) (ACUTE ONLY): 39 min  Charges:  $Therapeutic Exercise: 23-37 mins $Therapeutic Activity: 8-22 mins                    D. Scott Makinzie Considine PT, DPT 05/15/21, 3:16 PM

## 2021-05-15 NOTE — Progress Notes (Signed)
Pharmacy - Clozapine     This patient's order has been reviewed for prescribing contraindications.   Labs: ANC 8.2 10/5  The medication is being dispensed pursuant to the FDA REMS suspension order of 06/29/20 that allows for dispensing without a patient REMS dispense authorization (RDA).    Doloris Hall, PharmD Pharmacy Resident  05/15/2021 2:18 PM

## 2021-05-15 NOTE — Progress Notes (Signed)
PROGRESS NOTE    Brandon [name]  BMW:[id number] DOB: 1956-03-20 DOA: 04/28/2021 PCP: Sherron Monday, MD    Brief Narrative:  66 year old man history of COPD chronic hypoxic respiratory failure, CAD, hyperlipidemia hypertension schizophrenia and seizure and hyponatremia.  Initially admitted 15 days ago under the critical care service and was intubated for acute hypoxic respiratory failure and also hypotensive.  Patient also had a sodium of 122.  The patient has been unable to speak since being extubated.  ENT seen him  but he refused laryngoscope.  MRI of the brain did show T2 and flair hyperintensity in the [place] bilaterally which may be areas of recent ischemia which could correlate with hypoxia episode.  The patient has had bilateral leg weakness.  Restarted antibiotics on 05/12/2021 secondary to pneumonia on chest x-ray.    Assessment & Plan:   Principal Problem:   Schizoaffective disorder, bipolar type (HCC) Active Problems:   Coronary artery disease   Essential hypertension   COPD (chronic obstructive pulmonary disease) (HCC)   Acute hyponatremia   Right sided weakness   Schizophrenia (HCC)   BPH (benign prostatic hyperplasia)   Acute respiratory failure with hypoxia and hypercapnia (HCC)   Acute metabolic encephalopathy   Endotracheally intubated   On mechanically assisted ventilation (HCC)   Altered mental status   Palliative care by specialist   DNR (do not resuscitate)   Unresponsiveness   Dysarthria   Acute on chronic respiratory failure with hypoxia and hypercapnia (HCC)   Constipation   Weakness of both lower extremities   Pneumonia of left lower lobe due to infectious organism   Plantar fasciitis of right foot   Sinus tachycardia  #1.  Dysarthria. Patient has not been able to talk since extubation.  MRI shows hyperdensity in putamen bilaterally. Patient could have an ischemic stroke at the time of intubation.  Patient may also have anoxic brain  injury. Obtain EEG. Continue aspirin, statin discontinued for likely weakness.  2.  Left lower lobe pneumonia. Patient was treated with Zosyn, changed to Augmentin yesterday.  Patient also placed on steroids. I will recheck procalcitonin level, may discontinue antibiotics if not elevated significantly.  Patient might have had aspiration.  3.  Right foot pain secondary to plantar fasciitis. Patient is on steroids.  4.  Acute on chronic failure with hypoxemia and hypercapnia. Condition seem to be improved. Patient had a VQ scan yesterday, combined with a negative bilateral leg DVT, it is considered low probability for PE.  #5. constipation. Patient had a multiple bowel movement, change lactulose to as needed.  6.  Schizophrenia.  Patient has been seen by palliative care, due to poor prognosis, family is considering possibility of hospice.   DVT prophylaxis: Lovenox Code Status: DNR Family Communication:  Disposition Plan:    Status is: Inpatient  Remains inpatient appropriate because:IV treatments appropriate due to intensity of illness or inability to take PO and Inpatient level of care appropriate due to severity of illness  Dispo: The patient is from: Home              Anticipated d/c is to: SNF              Patient currently is not medically stable to d/c.   Difficult to place patient No        I/O last 3 completed shifts: In: 310.2 [P.O.:240; IV Piggyback:70.2] Out: 3150 [Urine:3150] Total I/O In: 120 [P.O.:120] Out: -      Consultants:  Palliative  Procedures: None  Antimicrobials: Augmentin. Subjective: Patient still aphasic, but he was able to understand words.  He was able to point to the words on the board. On 2-1/2 L oxygen, not short of breath. Had a multiple bowel movement after stool softener, no abdominal pain or nausea vomiting. No fever or chills.  Objective: Vitals:   05/15/21 0600 05/15/21 0812 05/15/21 1234 05/15/21 1600  BP:   109/85 100/74 103/75  Pulse:  87 99 96  Resp:  16 18 18   Temp:  97.8 F (36.6 C) 97.7 F (36.5 C) (!) 97.4 F (36.3 C)  TempSrc:  Axillary    SpO2:  100% 96% 97%  Weight: 74.4 kg     Height: 6' (1.829 m)       Intake/Output Summary (Last 24 hours) at 05/15/2021 1601 Last data filed at 05/15/2021 1300 Gross per 24 hour  Intake 120 ml  Output 1550 ml  Net -1430 ml   Filed Weights   05/12/21 0500 05/13/21 0539 05/15/21 0600  Weight: 75 kg 75.4 kg 74.4 kg    Examination:  General exam: Appears calm and comfortable  Respiratory system: Clear to auscultation. Respiratory effort normal. Cardiovascular system: S1 & S2 heard, RRR. No JVD, murmurs, rubs, gallops or clicks. No pedal edema. Gastrointestinal system: Abdomen is nondistended, soft and nontender. No organomegaly or masses felt. Normal bowel sounds heard. Central nervous system: Alert and oriented x2.  Aphasic. Extremities: Symmetric 5 x 5 power. Skin: No rashes, lesions or ulcers Psychiatry: Judgement and insight appear normal. Mood & affect appropriate.     Data Reviewed: I have personally reviewed following labs and imaging studies  CBC: Recent Labs  Lab 05/11/21 0457 05/13/21 0617 05/15/21 0707  WBC 7.6 11.7* 10.1  NEUTROABS  --  9.6* 8.2*  HGB 12.2* 12.0* 11.8*  HCT 36.5* 36.8* 37.3*  MCV 88.8 88.9 89.0  PLT 290 296 322   Basic Metabolic Panel: Recent Labs  Lab 05/09/21 0457 05/11/21 0457 05/13/21 0617 05/15/21 0707  NA 134* 132* 131* 134*  K 4.0 3.7 3.8 3.6  CL 96* 94* 95* 92*  CO2 30 29 29  34*  GLUCOSE 96 85 105* 99  BUN 10 12 11 11   CREATININE 0.59* 0.48* 0.59* 0.32*  CALCIUM 8.9 9.1 8.8* 9.1   GFR: Estimated Creatinine Clearance: 96.9 mL/min (A) (by C-G formula based on SCr of 0.32 mg/dL (L)). Liver Function Tests: No results for input(s): AST, ALT, ALKPHOS, BILITOT, PROT, ALBUMIN in the last 168 hours. No results for input(s): LIPASE, AMYLASE in the last 168 hours. No results for  input(s): AMMONIA in the last 168 hours. Coagulation Profile: No results for input(s): INR, PROTIME in the last 168 hours. Cardiac Enzymes: Recent Labs  Lab 05/11/21 0457  CKTOTAL 32*   BNP (last 3 results) No results for input(s): PROBNP in the last 8760 hours. HbA1C: No results for input(s): HGBA1C in the last 72 hours. CBG: Recent Labs  Lab 05/10/21 0337 05/10/21 0403 05/10/21 0729 05/10/21 1220 05/10/21 1643  GLUCAP 90 90 91 99 88   Lipid Profile: No results for input(s): CHOL, HDL, LDLCALC, TRIG, CHOLHDL, LDLDIRECT in the last 72 hours. Thyroid Function Tests: No results for input(s): TSH, T4TOTAL, FREET4, T3FREE, THYROIDAB in the last 72 hours. Anemia Panel: No results for input(s): VITAMINB12, FOLATE, FERRITIN, TIBC, IRON, RETICCTPCT in the last 72 hours. Sepsis Labs: Recent Labs  Lab 05/12/21 0133 05/12/21 0630 05/13/21 0617  PROCALCITON <0.10 <0.10 <0.10    Recent Results (from the past 240 hour(s))  SARS CORONAVIRUS 2 (TAT 6-24 HRS) Nasopharyngeal Nasopharyngeal Swab     Status: None   Collection Time: 05/07/21  2:24 PM   Specimen: Nasopharyngeal Swab  Result Value Ref Range Status   SARS Coronavirus 2 NEGATIVE NEGATIVE Final    Comment: (NOTE) SARS-CoV-2 target nucleic acids are NOT DETECTED.  The SARS-CoV-2 RNA is generally detectable in upper and lower respiratory specimens during the acute phase of infection. Negative results do not preclude SARS-CoV-2 infection, do not rule out co-infections with other pathogens, and should not be used as the sole basis for treatment or other patient management decisions. Negative results must be combined with clinical observations, patient history, and epidemiological information. The expected result is Negative.  Fact Sheet for Patients: HairSlick.no  Fact Sheet for Healthcare Providers: quierodirigir.com  This test is not yet approved or cleared by the  [place] FDA and  has been authorized for detection and/or diagnosis of SARS-CoV-2 by FDA under an Emergency Use Authorization (EUA). This EUA will remain  in effect (meaning this test can be used) for the duration of the COVID-19 declaration under Se ction 564(b)(1) of the Act, 21 U.S.C. section 360bbb-3(b)(1), unless the authorization is terminated or revoked sooner.  Performed at [facility], [address] [place], [place] [address]          Radiology Studies: NM Pulmonary Perfusion  Result Date: 05/15/2021 CLINICAL DATA:  Evaluate for pulmonary embolism. EXAM: NUCLEAR MEDICINE PERFUSION LUNG SCAN TECHNIQUE: Perfusion images were obtained in multiple projections after intravenous injection of radiopharmaceutical. Ventilation scans intentionally deferred if perfusion scan and chest x-ray adequate for interpretation during COVID 19 epidemic. RADIOPHARMACEUTICALS:  4.3 mCi Tc-54m MAA IV COMPARISON:  Chest radiograph-05/14/2021; 05/04/2021 Chest CT-05/18/2021 Bilateral lower extremity venous Doppler ultrasound-earlier same day (negative) FINDINGS: Review of chest radiograph performed 05/14/2021 demonstrates partial obscuration the left heart border secondary to left mid and lower lung heterogeneous/consolidative opacities and suspected trace left-sided pleural effusion. The right hemithorax remains well aerated. There is a matched perfusion defect involving the basilar aspect of the left lower lung with relative homogeneous distribution of injected radiotracer throughout the right hepatic parenchyma as well as the aerated portion of the left upper lung. IMPRESSION: Matched airspace opacity and perfusion defect within the basilar aspect of the left lower lung, technically compatible with an intermediate probability V/Q scan though given negative bilateral lower extremity venous Doppler ultrasound performed earlier same day, the examination is favored to be low probability. Further  evaluation with chest CTA could be performed as indicated. Electronically Signed   By: Simonne Come M.D.   On: 05/15/2021 14:49   US Venous Img Lower Bilateral (DVT)  Result Date: 05/15/2021 CLINICAL DATA:  65 year old male with a history shortness breath EXAM: BILATERAL LOWER EXTREMITY VENOUS DOPPLER ULTRASOUND TECHNIQUE: Gray-scale sonography with graded compression, as well as color Doppler and duplex ultrasound were performed to evaluate the lower extremity deep venous systems from the level of the common femoral vein and including the common femoral, femoral, profunda femoral, popliteal and calf veins including the posterior tibial, peroneal and gastrocnemius veins when visible. The superficial great saphenous vein was also interrogated. Spectral Doppler was utilized to evaluate flow at rest and with distal augmentation maneuvers in the common femoral, femoral and popliteal veins. COMPARISON:  None. FINDINGS: RIGHT LOWER EXTREMITY Common Femoral Vein: No evidence of thrombus. Normal compressibility, respiratory phasicity and response to augmentation. Saphenofemoral Junction: No evidence of thrombus. Normal compressibility and flow on color Doppler imaging. Profunda Femoral  Vein: No evidence of thrombus. Normal compressibility and flow on color Doppler imaging. Femoral Vein: No evidence of thrombus. Normal compressibility, respiratory phasicity and response to augmentation. Popliteal Vein: No evidence of thrombus. Normal compressibility, respiratory phasicity and response to augmentation. Calf Veins: No evidence of thrombus. Normal compressibility and flow on color Doppler imaging. Superficial Great Saphenous Vein: No evidence of thrombus. Normal compressibility and flow on color Doppler imaging. Other Findings:  None. LEFT LOWER EXTREMITY Common Femoral Vein: No evidence of thrombus. Normal compressibility, respiratory phasicity and response to augmentation. Saphenofemoral Junction: No evidence of thrombus.  Normal compressibility and flow on color Doppler imaging. Profunda Femoral Vein: No evidence of thrombus. Normal compressibility and flow on color Doppler imaging. Femoral Vein: No evidence of thrombus. Normal compressibility, respiratory phasicity and response to augmentation. Popliteal Vein: No evidence of thrombus. Normal compressibility, respiratory phasicity and response to augmentation. Calf Veins: No evidence of thrombus. Normal compressibility and flow on color Doppler imaging. Superficial Great Saphenous Vein: No evidence of thrombus. Normal compressibility and flow on color Doppler imaging. Other Findings:  None. IMPRESSION: Sonographic survey of bilateral lower extremities negative DVT Electronically Signed   By: Gilmer Mor D.O.   On: 05/15/2021 10:02   DG Chest Port 1 View  Result Date: 05/14/2021 CLINICAL DATA:  Shortness of breath EXAM: PORTABLE CHEST 1 VIEW COMPARISON:  05/12/2021 FINDINGS: Cardiac shadow is stable. Persistent small left effusion with basilar airspace opacity is noted. The overall appearance is stable. Right lung remains clear. No bony abnormality is noted. IMPRESSION: Stable left effusion and basilar airspace opacity. Electronically Signed   By: Alcide Clever M.D.   On: 05/14/2021 19:00   DG Abd 2 Views  Result Date: 05/14/2021 CLINICAL DATA:  Constipation EXAM: ABDOMEN - 2 VIEW COMPARISON:  04/05/2021 FINDINGS: Previously seen enteric tube has been removed. The bowel gas pattern is nonobstructive. Colonic interposition under the right hemidiaphragm. There is no evidence of free air. Large volume of stool projects throughout the colon. No radio-opaque calculi or other significant radiographic abnormality is seen. Extensive atherosclerotic vascular calcifications. IMPRESSION: 1. Nonobstructive bowel gas pattern. 2. Large volume of stool throughout the colon. Electronically Signed   By: Duanne Guess D.O.   On: 05/14/2021 10:52        Scheduled Meds:   amoxicillin-clavulanate  1 tablet Oral Q12H   arformoterol  15 mcg Nebulization BID   aspirin  81 mg Oral Daily   budesonide (PULMICORT) nebulizer solution  0.5 mg Nebulization BID   buPROPion ER  100 mg Oral Daily   Chlorhexidine Gluconate Cloth  6 each Topical Daily   cholecalciferol  1,000 Units Oral Daily   cloZAPine  75 mg Oral QHS   diltiazem  120 mg Oral Daily   docusate sodium  200 mg Oral BID   dorzolamide-timolol  1 drop Both Eyes BID   enoxaparin (LOVENOX) injection  40 mg Subcutaneous Q24H   ezetimibe  10 mg Oral QHS   finasteride  5 mg Oral Daily   isosorbide mononitrate  30 mg Oral Daily   latanoprost  1 drop Both Eyes QHS   melatonin  5 mg Oral QHS   methylPREDNISolone (SOLU-MEDROL) injection  40 mg Intravenous Daily   multivitamin with minerals  1 tablet Oral Daily   pantoprazole  40 mg Oral Daily   revefenacin  175 mcg Nebulization Daily   tamsulosin  0.4 mg Oral QPC breakfast   Continuous Infusions:  sodium chloride Stopped (05/04/21 1557)     LOS:  17 days    Time spent: 28 minutes    Marrion Coy, MD [facility]   To contact the attending provider between 7A-7P or the covering provider during after hours 7P-7A, please log into the web site [email or web address] and access using universal [place] password for that web site. If you do not have the password, please call the hospital operator.  05/15/2021, 4:01 PM

## 2021-05-15 NOTE — Plan of Care (Signed)

## 2021-05-16 ENCOUNTER — Inpatient Hospital Stay: Payer: Medicare Other

## 2021-05-16 DIAGNOSIS — R479 Unspecified speech disturbances: Secondary | ICD-10-CM | POA: Diagnosis not present

## 2021-05-16 DIAGNOSIS — J9621 Acute and chronic respiratory failure with hypoxia: Secondary | ICD-10-CM | POA: Diagnosis not present

## 2021-05-16 DIAGNOSIS — Z7189 Other specified counseling: Secondary | ICD-10-CM

## 2021-05-16 DIAGNOSIS — F250 Schizoaffective disorder, bipolar type: Secondary | ICD-10-CM | POA: Diagnosis not present

## 2021-05-16 DIAGNOSIS — J9622 Acute and chronic respiratory failure with hypercapnia: Secondary | ICD-10-CM | POA: Diagnosis not present

## 2021-05-16 DIAGNOSIS — Z66 Do not resuscitate: Secondary | ICD-10-CM | POA: Diagnosis not present

## 2021-05-16 DIAGNOSIS — R471 Dysarthria and anarthria: Secondary | ICD-10-CM | POA: Diagnosis not present

## 2021-05-16 DIAGNOSIS — Z515 Encounter for palliative care: Secondary | ICD-10-CM | POA: Diagnosis not present

## 2021-05-16 DIAGNOSIS — J9601 Acute respiratory failure with hypoxia: Secondary | ICD-10-CM | POA: Diagnosis not present

## 2021-05-16 DIAGNOSIS — G9341 Metabolic encephalopathy: Secondary | ICD-10-CM | POA: Diagnosis not present

## 2021-05-16 MED ORDER — PREDNISONE 20 MG PO TABS: 40.0000 mg | ORAL_TABLET | Freq: Every day | ORAL | Status: DC

## 2021-05-16 NOTE — TOC Progression Note (Signed)
Transition of Care [facility]) - Progression Note    Patient Details  Name: Brandon [name] MRN: [id number] Date of Birth: 1955/09/09  [facility]) CM/SW Contact  Gildardo Griffes, [place] Phone Number: 05/16/2021, 12:58 PM  Clinical Narrative:     CSW spoke with patient's brother Brandon [name] regarding plan at time of discharge. He reports after speaking with palliative yesterday 10/5, he would like to move forward with SNF placement and outpatient hospice services at facility.   CSW informed Brandon [name] that patient cannot return to [facility], per [place] with [facility]. Brandon [name] expressed understanding and all questions answered.   CSW informed Brandon [name] of 1 bed offer from [place], Brandon [name] reports he would accept this.   CSW has informed Inetta Fermo with [facility] that patient/family accepts bed offer.   CSW has initiated [facility] insurance auth and faxed all clinicals to [facility]. Pending insurance auth at this time.     Expected Discharge Plan: Home w Hospice Care Barriers to Discharge: No Barriers Identified  Expected Discharge Plan and Services Expected Discharge Plan: Home w Hospice Care In-house Referral: Clinical Social Work     Living arrangements for the past 2 months: Apartment                                       Social Determinants of Health (SDOH) Interventions    Readmission Risk Interventions Readmission Risk Prevention Plan 04/17/2021 04/09/2021 03/24/2021  Transportation Screening Complete Complete Complete  PCP or Specialist Appt within 5-7 Days - - Complete  Home Care Screening - - Complete  Medication Review (RN CM) - - Complete  Medication Review [facility]) - Complete -  PCP or Specialist appointment within 3-5 days of discharge Complete Complete -  HRI or Home Care Consult Complete Complete -  SW Recovery Care/Counseling Consult Complete Complete -  Palliative Care Screening Not Applicable Not Applicable -  Skilled Nursing Facility Complete Complete -   Some recent data might be hidden

## 2021-05-16 NOTE — Procedures (Addendum)
Patient Name: Brandon [name]  MRN: [id number]  Epilepsy Attending: Charlsie Quest  Referring Physician/Provider: Dr Marrion Coy Date: 05/16/2021 Duration: 23.56 mins  Patient history: 65 year old male with transient speech disturbance and AMS.  EEG to evaluate for seizures.  Level of alertness: Awake  AEDs during EEG study: None  Technical aspects: This EEG study was done with scalp electrodes positioned according to the 10-20 International system of electrode placement. Electrical activity was acquired at a sampling rate of 500Hz  and reviewed with a high frequency filter of 70Hz  and a low frequency filter of 1Hz . EEG data were recorded continuously and digitally stored.   Description: The posterior dominant rhythm consists of 8Hz  activity of moderate voltage (25-35 uV) seen predominantly in posterior head regions, symmetric and reactive to eye opening and eye closing. Physiologic photic driving was not seen during photic stimulation.  Hyperventilation was not performed.     IMPRESSION: This study is within normal limits. No seizures or epileptiform discharges were seen throughout the recording.  Kierah Goatley 

## 2021-05-16 NOTE — Progress Notes (Signed)
   05/16/21 1752  Assess: MEWS Score  Temp 98.6 F (37 C)  BP 127/83  Pulse Rate (!) 121  Resp 18  SpO2 (!) 89 %  O2 Device Nasal Cannula  O2 Flow Rate (L/min) 3 L/min  Assess: MEWS Score  MEWS Temp 0  MEWS Systolic 0  MEWS Pulse 2  MEWS RR 0  MEWS LOC 0  MEWS Score 2  MEWS Score Color Yellow  Assess: if the MEWS score is Yellow or Red  Were vital signs taken at a resting state? Yes  Focused Assessment Change from prior assessment (see assessment flowsheet)  Does the patient meet 2 or more of the SIRS criteria? No  Does the patient have a confirmed or suspected source of infection? No  Provider and Rapid Response Notified? No  Treat  MEWS Interventions Other (Comment) (going to cxray to eval SOB )  Pain Scale 0-10  Pain Score 0  Take Vital Signs  Increase Vital Sign Frequency  Yellow: Q 2hr X 2 then Q 4hr X 2, if remains yellow, continue Q 4hrs  Escalate  MEWS: Escalate Yellow: discuss with charge nurse/RN and consider discussing with provider and RRT  Notify: Charge Nurse/RN  Name of Charge Nurse/RN Notified Shanda Bumps Christmas RN   Date Charge Nurse/RN Notified 05/16/21  Time Charge Nurse/RN Notified 1757  Document  Patient Outcome Other (Comment) (MD notified/pt going to cxray for SOB Eval )  Progress note created (see row info) Yes  Assess: SIRS CRITERIA  SIRS Temperature  0  SIRS Pulse 1  SIRS Respirations  0  SIRS WBC 0  SIRS Score Sum  1

## 2021-05-16 NOTE — Progress Notes (Addendum)
Palliative:  HPI: 65 y.o. male  with past medical history of schizoaffective disorder, CAD, HTN, COPD, acute hyponatremia, BPH, acute respiratory failure with hypoxia and hypercapnia, and acute metabolic encephalopathy admitted on 04/28/2021 with hypoxia and hypotension from SNF [facility]. This is his 6th admission in 2022. Pt has been intubated and self extubated 9/24. Plan is to d/c to SNF for rehab. PMT consulted to discussion [place].  I first met with Brandon [name] - he is sitting up in bed - no family at bedside. He is writing on paper when I enter but the words are unintelligible. I show him his alphabet board and request he use this to communicate with me. At first, he will only stare at me and occasionally try to mouth words to me however I could not discern what he was saying. He then agrees to use the alphabet board - we did this for an extended amount of time and much of what he attempted to communicate was indiscernable - he seemed disorganized. I did understand the words "when?" "Where?" And "options". He confirms he is asking me about his discharge plan. I tell him I am unsure and need to speak to his medical team further and his brother. He agrees and whispers "don't forget me".   I called and spoke with brother/HCPOA, Brandon [name]. Brandon [name] shares his visit with patient yesterday compared to today - he was not doing well at all yesterday and Brandon [name] feared he was actively dying. He shares patient had expressed concern as well that he was dying. We discuss that patient's respiratory status improved today. I did share with Brandon [name] that patient's work with PT today showed continued significant weakness - needed assistance to maintain sitting position. We discuss uncertainty and concern of how patient will do in the future. We discuss concern that patient will not return to prior level of function - Brandon [name] agrees with this. We discuss options moving forward:  making no change in care plan and dc to facility once medically  stable but anticipate repeated admissions as he has already been admitted 6 times this year dc to facility with hospice to support and focus more on comfort/quality of life and avoiding aggressive medical interventions  IF patient experiences further decline and medical team feels he is nearing end of life and prognosis is likely days-weeks hospice home could be considered   Brandon [name] expresses understanding and gratitude for explanation of options. We discuss that at this point the best option may be dc to facility with hospice however will continue current plan of care to allow time for outcomes. If declines further, Brandon [name] would be interested in hospice facility for support.  Brandon [name] has good understanding of trajectory and high risk of further complications that can lead to further decline. Brandon [name] does not want his brother to suffer. He wishes to continue with treatments to optimize health.  All questions/concerns addressed. Emotional support provided.   Exam: Alert - difficult to assess orientation. Attempts to mouth words but unable to interpret. No distress. Breathing regular, unlabored. Abd soft, flat.   Plan: - DNR - Continue interventions to optimize health - Time for outcomes - considering hospice support at discharge but would like to see how patient does in coming days to determine what level of support is best  40 minutes  [place], DNP, [facility] Palliative Medicine Team Team Phone # [phone]  Pager # [phone]  Greater than 50%  of this time was spent counseling and coordinating care related to the  above assessment and plan.

## 2021-05-16 NOTE — Progress Notes (Addendum)
PROGRESS NOTE    Brandon [name]  BMW:[id number] DOB: 04-Jan-1956 DOA: 04/28/2021 PCP: Sherron Monday, MD    Brief Narrative:  65 year old man history of COPD chronic hypoxic respiratory failure, CAD, hyperlipidemia hypertension schizophrenia and seizure and hyponatremia.  Initially admitted 15 days ago under the critical care service and was intubated for acute hypoxic respiratory failure and also hypotensive.  Patient also had a sodium of 122.  The patient has been unable to speak since being extubated.  ENT seen him  but he refused laryngoscope.  MRI of the brain did show T2 and flair hyperintensity in the [place] bilaterally which may be areas of recent ischemia which could correlate with hypoxia episode.  The patient has had bilateral leg weakness.  Restarted antibiotics on 05/12/2021 secondary to pneumonia on chest x-ray.    Assessment & Plan:   Principal Problem:   Schizoaffective disorder, bipolar type (HCC) Active Problems:   Coronary artery disease   Essential hypertension   COPD (chronic obstructive pulmonary disease) (HCC)   Acute hyponatremia   Right sided weakness   Schizophrenia (HCC)   BPH (benign prostatic hyperplasia)   Acute respiratory failure with hypoxia and hypercapnia (HCC)   Acute metabolic encephalopathy   Endotracheally intubated   On mechanically assisted ventilation (HCC)   Altered mental status   Palliative care by specialist   DNR (do not resuscitate)   Unresponsiveness   Dysarthria   Acute on chronic respiratory failure with hypoxia and hypercapnia (HCC)   Constipation   Weakness of both lower extremities   Pneumonia of left lower lobe due to infectious organism   Plantar fasciitis of right foot   Sinus tachycardia  #1.  Dysarthria. Acute stroke. MRI ischemic changes appear to be recent while in the hospital. Pending EEG. Patient was able to speak today, he has a very weak voice, almost whispering.  But he was able to speak a few words.   Dysarthria could be from his vocal cord dysfunction.  Patient has been seen by ENT, refused laryngoscopy.  2.  Left lower lobe pneumonia. Procalcitonin level still less than 0.1, will discontinue antibiotics.  3.  Right foot pain. Discontinue steroids, condition improving.  4.  Acute on chronic respiratory failure with hypoxemia and hypercapnia. Condition had initially improved, currently patient does not have significant risk for PE. But he seems having more shortness of breath, most likely due to aspiration. Will obtain CXR.  5.  Schizophrenia.   DVT prophylaxis: Lovenox Code Status: DNR Family Communication:  Disposition Plan:      Status is: Inpatient   Remains inpatient appropriate because: Unsafe discharge plan.   Dispo: The patient is from: Home              Anticipated d/c is to: pending              Patient currently is not medically stable to d/c.              Difficult to place patient No   Patient condition seem to be stable to discharge.  Patient has been followed by palliative care, will decide if patient need to go to nursing home versus going home with hospice.      I/O last 3 completed shifts: In: 120 [P.O.:120] Out: 1900 [Urine:1900] No intake/output data recorded.     Consultants:  Palliative  Procedures: None  Antimicrobials: None Subjective: Patient currently on 2 L oxygen, still has some short of breath with exertion. Patient was able to  speak today with me, he was able to tell me his name, he was in the hospital.  But he was almost whispering.  He does not seem to have aphasia. No abdominal pain or nausea vomiting. No dysuria hematuria.  Objective: Vitals:   05/16/21 0530 05/16/21 0626 05/16/21 0801 05/16/21 0833  BP: 129/89   126/90  Pulse: 86   85  Resp: 15   18  Temp: 99.1 F (37.3 C)   97.6 F (36.4 C)  TempSrc:      SpO2: 97%  96% 95%  Weight:  75.1 kg    Height:        Intake/Output Summary (Last 24 hours) at 05/16/2021  1213 Last data filed at 05/16/2021 0643 Gross per 24 hour  Intake 120 ml  Output 600 ml  Net -480 ml   Filed Weights   05/13/21 0539 05/15/21 0600 05/16/21 0626  Weight: 75.4 kg 74.4 kg 75.1 kg    Examination:  General exam: Appears calm and comfortable  Respiratory system: Clear to auscultation. Respiratory effort normal. Cardiovascular system: S1 & S2 heard, RRR. No JVD, murmurs, rubs, gallops or clicks. No pedal edema. Gastrointestinal system: Abdomen is nondistended, soft and nontender. No organomegaly or masses felt. Normal bowel sounds heard. Central nervous system: Alert and oriented x2. No focal neurological deficits. Extremities: Symmetric 5 x 5 power. Skin: No rashes, lesions or ulcers Psychiatry: Mood & affect appropriate.     Data Reviewed: I have personally reviewed following labs and imaging studies  CBC: Recent Labs  Lab 05/11/21 0457 05/13/21 0617 05/15/21 0707  WBC 7.6 11.7* 10.1  NEUTROABS  --  9.6* 8.2*  HGB 12.2* 12.0* 11.8*  HCT 36.5* 36.8* 37.3*  MCV 88.8 88.9 89.0  PLT 290 296 322   Basic Metabolic Panel: Recent Labs  Lab 05/11/21 0457 05/13/21 0617 05/15/21 0707  NA 132* 131* 134*  K 3.7 3.8 3.6  CL 94* 95* 92*  CO2 29 29 34*  GLUCOSE 85 105* 99  BUN 12 11 11   CREATININE 0.48* 0.59* 0.32*  CALCIUM 9.1 8.8* 9.1   GFR: Estimated Creatinine Clearance: 97.8 mL/min (A) (by C-G formula based on SCr of 0.32 mg/dL (L)). Liver Function Tests: No results for input(s): AST, ALT, ALKPHOS, BILITOT, PROT, ALBUMIN in the last 168 hours. No results for input(s): LIPASE, AMYLASE in the last 168 hours. No results for input(s): AMMONIA in the last 168 hours. Coagulation Profile: No results for input(s): INR, PROTIME in the last 168 hours. Cardiac Enzymes: Recent Labs  Lab 05/11/21 0457  CKTOTAL 32*   BNP (last 3 results) No results for input(s): PROBNP in the last 8760 hours. HbA1C: No results for input(s): HGBA1C in the last 72  hours. CBG: Recent Labs  Lab 05/10/21 0337 05/10/21 0403 05/10/21 0729 05/10/21 1220 05/10/21 1643  GLUCAP 90 90 91 99 88   Lipid Profile: No results for input(s): CHOL, HDL, LDLCALC, TRIG, CHOLHDL, LDLDIRECT in the last 72 hours. Thyroid Function Tests: No results for input(s): TSH, T4TOTAL, FREET4, T3FREE, THYROIDAB in the last 72 hours. Anemia Panel: No results for input(s): VITAMINB12, FOLATE, FERRITIN, TIBC, IRON, RETICCTPCT in the last 72 hours. Sepsis Labs: Recent Labs  Lab 05/12/21 0133 05/12/21 0630 05/13/21 0617 05/15/21 0707  PROCALCITON <0.10 <0.10 <0.10 <0.10    Recent Results (from the past 240 hour(s))  SARS CORONAVIRUS 2 (TAT 6-24 HRS) Nasopharyngeal Nasopharyngeal Swab     Status: None   Collection Time: 05/07/21  2:24 PM   Specimen:  Nasopharyngeal Swab  Result Value Ref Range Status   SARS Coronavirus 2 NEGATIVE NEGATIVE Final    Comment: (NOTE) SARS-CoV-2 target nucleic acids are NOT DETECTED.  The SARS-CoV-2 RNA is generally detectable in upper and lower respiratory specimens during the acute phase of infection. Negative results do not preclude SARS-CoV-2 infection, do not rule out co-infections with other pathogens, and should not be used as the sole basis for treatment or other patient management decisions. Negative results must be combined with clinical observations, patient history, and epidemiological information. The expected result is Negative.  Fact Sheet for Patients: HairSlick.no  Fact Sheet for Healthcare Providers: quierodirigir.com  This test is not yet approved or cleared by the [place] FDA and  has been authorized for detection and/or diagnosis of SARS-CoV-2 by FDA under an Emergency Use Authorization (EUA). This EUA will remain  in effect (meaning this test can be used) for the duration of the COVID-19 declaration under Se ction 564(b)(1) of the Act, 21 U.S.C. section  360bbb-3(b)(1), unless the authorization is terminated or revoked sooner.  Performed at [id number], [address], [place], [place] [address]          Radiology Studies: NM Pulmonary Perfusion  Result Date: 05/15/2021 CLINICAL DATA:  Evaluate for pulmonary embolism. EXAM: NUCLEAR MEDICINE PERFUSION LUNG SCAN TECHNIQUE: Perfusion images were obtained in multiple projections after intravenous injection of radiopharmaceutical. Ventilation scans intentionally deferred if perfusion scan and chest x-ray adequate for interpretation during COVID 19 epidemic. RADIOPHARMACEUTICALS:  4.3 mCi Tc-71m MAA IV COMPARISON:  Chest radiograph-05/14/2021; 05/04/2021 Chest CT-05/18/2021 Bilateral lower extremity venous Doppler ultrasound-earlier same day (negative) FINDINGS: Review of chest radiograph performed 05/14/2021 demonstrates partial obscuration the left heart border secondary to left mid and lower lung heterogeneous/consolidative opacities and suspected trace left-sided pleural effusion. The right hemithorax remains well aerated. There is a matched perfusion defect involving the basilar aspect of the left lower lung with relative homogeneous distribution of injected radiotracer throughout the right hepatic parenchyma as well as the aerated portion of the left upper lung. IMPRESSION: Matched airspace opacity and perfusion defect within the basilar aspect of the left lower lung, technically compatible with an intermediate probability V/Q scan though given negative bilateral lower extremity venous Doppler ultrasound performed earlier same day, the examination is favored to be low probability. Further evaluation with chest CTA could be performed as indicated. Electronically Signed   By: Simonne Come M.D.   On: 05/15/2021 14:49   US Venous Img Lower Bilateral (DVT)  Result Date: 05/15/2021 CLINICAL DATA:  65 year old male with a history shortness breath EXAM: BILATERAL LOWER EXTREMITY VENOUS DOPPLER  ULTRASOUND TECHNIQUE: Gray-scale sonography with graded compression, as well as color Doppler and duplex ultrasound were performed to evaluate the lower extremity deep venous systems from the level of the common femoral vein and including the common femoral, femoral, profunda femoral, popliteal and calf veins including the posterior tibial, peroneal and gastrocnemius veins when visible. The superficial great saphenous vein was also interrogated. Spectral Doppler was utilized to evaluate flow at rest and with distal augmentation maneuvers in the common femoral, femoral and popliteal veins. COMPARISON:  None. FINDINGS: RIGHT LOWER EXTREMITY Common Femoral Vein: No evidence of thrombus. Normal compressibility, respiratory phasicity and response to augmentation. Saphenofemoral Junction: No evidence of thrombus. Normal compressibility and flow on color Doppler imaging. Profunda Femoral Vein: No evidence of thrombus. Normal compressibility and flow on color Doppler imaging. Femoral Vein: No evidence of thrombus. Normal compressibility, respiratory phasicity and response to  augmentation. Popliteal Vein: No evidence of thrombus. Normal compressibility, respiratory phasicity and response to augmentation. Calf Veins: No evidence of thrombus. Normal compressibility and flow on color Doppler imaging. Superficial Great Saphenous Vein: No evidence of thrombus. Normal compressibility and flow on color Doppler imaging. Other Findings:  None. LEFT LOWER EXTREMITY Common Femoral Vein: No evidence of thrombus. Normal compressibility, respiratory phasicity and response to augmentation. Saphenofemoral Junction: No evidence of thrombus. Normal compressibility and flow on color Doppler imaging. Profunda Femoral Vein: No evidence of thrombus. Normal compressibility and flow on color Doppler imaging. Femoral Vein: No evidence of thrombus. Normal compressibility, respiratory phasicity and response to augmentation. Popliteal Vein: No evidence  of thrombus. Normal compressibility, respiratory phasicity and response to augmentation. Calf Veins: No evidence of thrombus. Normal compressibility and flow on color Doppler imaging. Superficial Great Saphenous Vein: No evidence of thrombus. Normal compressibility and flow on color Doppler imaging. Other Findings:  None. IMPRESSION: Sonographic survey of bilateral lower extremities negative DVT Electronically Signed   By: Gilmer Mor D.O.   On: 05/15/2021 10:02   DG Chest Port 1 View  Result Date: 05/14/2021 CLINICAL DATA:  Shortness of breath EXAM: PORTABLE CHEST 1 VIEW COMPARISON:  05/12/2021 FINDINGS: Cardiac shadow is stable. Persistent small left effusion with basilar airspace opacity is noted. The overall appearance is stable. Right lung remains clear. No bony abnormality is noted. IMPRESSION: Stable left effusion and basilar airspace opacity. Electronically Signed   By: Alcide Clever M.D.   On: 05/14/2021 19:00        Scheduled Meds:  arformoterol  15 mcg Nebulization BID   aspirin  81 mg Oral Daily   budesonide (PULMICORT) nebulizer solution  0.5 mg Nebulization BID   buPROPion ER  100 mg Oral Daily   Chlorhexidine Gluconate Cloth  6 each Topical Daily   cholecalciferol  1,000 Units Oral Daily   cloZAPine  75 mg Oral QHS   diltiazem  120 mg Oral Daily   docusate sodium  200 mg Oral BID   dorzolamide-timolol  1 drop Both Eyes BID   enoxaparin (LOVENOX) injection  40 mg Subcutaneous Q24H   ezetimibe  10 mg Oral QHS   finasteride  5 mg Oral Daily   isosorbide mononitrate  30 mg Oral Daily   latanoprost  1 drop Both Eyes QHS   melatonin  5 mg Oral QHS   multivitamin with minerals  1 tablet Oral Daily   pantoprazole  40 mg Oral Daily   [START ON 05/17/2021] predniSONE  40 mg Oral Q breakfast   revefenacin  175 mcg Nebulization Daily   tamsulosin  0.4 mg Oral QPC breakfast   Continuous Infusions:  sodium chloride Stopped (05/04/21 1557)     LOS: 18 days    Time spent: 27  minutes    Marrion Coy, MD [facility]   To contact the attending provider between 7A-7P or the covering provider during after hours 7P-7A, please log into the web site [email or web address] and access using universal [place] password for that web site. If you do not have the password, please call the hospital operator.  05/16/2021, 12:13 PM

## 2021-05-16 NOTE — Progress Notes (Signed)
Occupational Therapy Treatment Patient Details Name: Brandon [name] MRN: [id number] DOB: 02-15-1956 Today's Date: 05/16/2021   History of present illness Pt is a 65 year old male with PMH including schizophrenia, seizures (ETOH withdrawal related), and ETOH abuse presenting to [facility] ED on 04/28/2021 from [facility] via EMS.  Staff at [facility] called EMS after patient was found to be unresponsive and reportedly hypotensive in the 50s for an unknown amount of time.  At [facility] patient became hypoxic with SPO2 in the 70s and was too confused to safely tolerate BiPAP requiring emergent intubation and mechanical ventilatory support. Pt was eventully extubated but needed to be reintubated 9/20 and was again extubated 9/21.  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   OT comments  Mr. Cisnero was seen for OT treatment on this date. Upon arrival to room pt was awake/alert and lying in bed.  Pt agreeable to tx. Pt instructed in HEP. Pt performed LE exercises including quad and glute squeezes, heel slides, and leg raises to maintain strength for functional mobility. Pt was MAX A for repositioning to center of bed  Pt making good progress toward goals. Pt continues to benefit from skilled OT services to maximize return to PLOF and minimize risk of future falls, injury, caregiver burden, and readmission. Will continue to follow POC. Discharge recommendation remains appropriate.     Recommendations for follow up therapy are one component of a multi-disciplinary discharge planning process, led by the attending physician.  Recommendations may be updated based on patient status, additional functional criteria  and insurance authorization.    Follow Up Recommendations     SNF  Equipment Recommendations   (defer to next venue)    Recommendations for Other Services      Precautions / Restrictions Precautions Precautions: Fall Restrictions Weight Bearing Restrictions: No       Mobility Bed Mobility Overal bed mobility: Needs Assistance             General bed mobility comments: MAX A for repositioning to center of bed    Transfers                 General transfer comment: Not tested    Balance                                           ADL either performed or assessed with clinical judgement   ADL Overall ADL's : Needs assistance/impaired                                       General ADL Comments: MAX A for LB access      Cognition Arousal/Alertness: Awake/alert Behavior During Therapy: [facility] for tasks assessed/performed                                            Exercises Exercises: Other exercises;General Lower Extremity General Exercises - Lower Extremity Quad Sets: 10 reps;Strengthening;Both;Supine Gluteal Sets: 10 reps;Strengthening;Both;Supine Short Arc Quad:  10 reps;Strengthening;Both;Supine Heel Slides: 10 reps;Strengthening;Both;Supine Straight Leg Raises: 5 reps;Strengthening;Supine Other Exercises Other Exercises: Pt educ re: importance of exercises for maintaining functional strengthening, HEP, OT role, Other Exercises: LE exercises - quads, gluts, heel slides, leg raises, repositioned in bed           Pertinent Vitals/ Pain       Pain Assessment: No/denies pain   Frequency  Min 2X/week        Progress Toward Goals  OT Goals(current goals can now be found in the care plan section)  Progress towards OT goals: Progressing toward goals  Acute Rehab OT Goals Patient Stated Goal: to have more therapy OT Goal Formulation: With patient Time For Goal Achievement:  05/18/21 Potential to Achieve Goals: Good ADL Goals Pt Will Perform Grooming: with min assist;sitting Pt Will Perform Lower Body Dressing: with mod assist;with max assist;sitting/lateral leans;with adaptive equipment Pt Will Transfer to Toilet: squat pivot transfer;with max assist;bedside commode Pt Will Perform Toileting - Clothing Manipulation and hygiene: with mod assist;with max assist;sitting/lateral leans Pt/caregiver will Perform Home Exercise Program: Increased strength;Both right and left upper extremity;With minimal assist Additional ADL Goal #1: Pt will demo F static sitting balance with UE support for 3 minutes EOB to improve trunk strength.  Plan Discharge plan remains appropriate;Frequency remains appropriate    Co-evaluation                 AM-PAC OT "6 Clicks" Daily Activity     Outcome Measure   Help from another person eating meals?: A Little Help from another person taking care of personal grooming?: A Little Help from another person toileting, which includes using toliet, bedpan, or urinal?: A Lot Help from another person bathing (including washing, rinsing, drying)?: A Lot Help from another person to put on and taking off regular upper body clothing?: A Little Help from another person to put on and taking off regular lower body clothing?: A Lot 6 Click Score: 15    End of Session Equipment Utilized During Treatment: Oxygen  OT Visit Diagnosis: Unsteadiness on feet (R26.81);Muscle weakness (generalized) (M62.81)   Activity Tolerance Patient tolerated treatment well   Patient Left in bed;with call bell/phone within reach;with nursing/sitter in room;with bed alarm set;with SCD's reapplied (AACs within reach)   Nurse Communication          Time: 9604-5409 OT Time Calculation (min): 20 min  Charges: OT General Charges $OT Visit: 1 Visit OT Treatments $Therapeutic Exercise: 8-22 mins  [place] [employer], Adine Madura 05/16/2021, 4:08 PM

## 2021-05-16 NOTE — Progress Notes (Signed)
Eeg done 

## 2021-05-16 NOTE — Care Management Important Message (Signed)
Important Message  Patient Details  Name: [name] MRN: [id number] Date of Birth: 11-28-1955   Medicare Important Message Given:  Yes     [name] 05/16/2021, 4:36 PM

## 2021-05-16 NOTE — Progress Notes (Signed)
Palliative:  HPI: 65 y.o. male  with past medical history of schizoaffective disorder, CAD, HTN, COPD, acute hyponatremia, BPH, acute respiratory failure with hypoxia and hypercapnia, and acute metabolic encephalopathy admitted on 04/28/2021 with hypoxia and hypotension from SNF [facility]. This is his 6th admission in 2022. Pt has been intubated and self extubated 9/24. Plan is to d/c to SNF for rehab. PMT consulted to discussion [place].  Met today with Brandon [name] and brother Brandon [name] at bedside. We discussed plan of care - discussed that EEG has been ordered/pending. Discussed that antibiotics were discontinued.  Brandon [name] is able to write today - he writes "sounds good" after explaining situation.   We discussed likely plan to go to facility. We discussed potential of going to facility with extra support - discussed hospice support yesterday with brother Brandon [name].  Brandon [name] shares concern that patient appears short of breath - worse than yesterday. Patient nods yes. We review that plan to go to facility with support of hospice might make most sense. Brandon [name] is interested in EEG results. Discussed ongoing PMT involvement and Brandon [name] is agreeable.   Brandon [name] has good understanding of trajectory and high risk of further complications that can lead to further decline. Brandon [name] does not want his brother to suffer. He wishes to continue with treatments to optimize health.  All questions/concerns addressed. Emotional support provided.   Exam: Alert - difficult to assess orientation. Attempts to mouth words but unable to interpret. No distress. Slightly labored breathing. Abd soft, flat.   Plan: - DNR - Continue interventions to optimize health - Time for outcomes - likely dc to SNF for LTC with hospice support  30 minutes  Brandon [name] Brandon Bigger, DNP, [facility] Palliative Medicine Team Team Phone # [phone]  Pager # [phone]  Greater than 50%  of this time was spent counseling and coordinating care related to the above  assessment and plan.

## 2021-05-17 DIAGNOSIS — E559 Vitamin D deficiency, unspecified: Secondary | ICD-10-CM | POA: Diagnosis not present

## 2021-05-17 DIAGNOSIS — Z7401 Bed confinement status: Secondary | ICD-10-CM | POA: Diagnosis not present

## 2021-05-17 DIAGNOSIS — E119 Type 2 diabetes mellitus without complications: Secondary | ICD-10-CM | POA: Diagnosis not present

## 2021-05-17 DIAGNOSIS — E875 Hyperkalemia: Secondary | ICD-10-CM | POA: Diagnosis not present

## 2021-05-17 DIAGNOSIS — R404 Transient alteration of awareness: Secondary | ICD-10-CM | POA: Diagnosis not present

## 2021-05-17 DIAGNOSIS — E871 Hypo-osmolality and hyponatremia: Secondary | ICD-10-CM | POA: Diagnosis not present

## 2021-05-17 DIAGNOSIS — F250 Schizoaffective disorder, bipolar type: Secondary | ICD-10-CM | POA: Diagnosis not present

## 2021-05-17 DIAGNOSIS — J189 Pneumonia, unspecified organism: Secondary | ICD-10-CM | POA: Diagnosis not present

## 2021-05-17 DIAGNOSIS — I639 Cerebral infarction, unspecified: Secondary | ICD-10-CM | POA: Diagnosis not present

## 2021-05-17 DIAGNOSIS — Z66 Do not resuscitate: Secondary | ICD-10-CM | POA: Diagnosis not present

## 2021-05-17 DIAGNOSIS — J449 Chronic obstructive pulmonary disease, unspecified: Secondary | ICD-10-CM | POA: Diagnosis not present

## 2021-05-17 DIAGNOSIS — J9622 Acute and chronic respiratory failure with hypercapnia: Secondary | ICD-10-CM | POA: Diagnosis not present

## 2021-05-17 DIAGNOSIS — Z515 Encounter for palliative care: Secondary | ICD-10-CM | POA: Diagnosis not present

## 2021-05-17 DIAGNOSIS — I2510 Atherosclerotic heart disease of native coronary artery without angina pectoris: Secondary | ICD-10-CM | POA: Diagnosis not present

## 2021-05-17 DIAGNOSIS — J9601 Acute respiratory failure with hypoxia: Secondary | ICD-10-CM | POA: Diagnosis not present

## 2021-05-17 DIAGNOSIS — J441 Chronic obstructive pulmonary disease with (acute) exacerbation: Secondary | ICD-10-CM | POA: Diagnosis not present

## 2021-05-17 DIAGNOSIS — D529 Folate deficiency anemia, unspecified: Secondary | ICD-10-CM | POA: Diagnosis not present

## 2021-05-17 DIAGNOSIS — I10 Essential (primary) hypertension: Secondary | ICD-10-CM | POA: Diagnosis not present

## 2021-05-17 DIAGNOSIS — Z743 Need for continuous supervision: Secondary | ICD-10-CM | POA: Diagnosis not present

## 2021-05-17 DIAGNOSIS — D519 Vitamin B12 deficiency anemia, unspecified: Secondary | ICD-10-CM | POA: Diagnosis not present

## 2021-05-17 DIAGNOSIS — J9621 Acute and chronic respiratory failure with hypoxia: Secondary | ICD-10-CM | POA: Diagnosis not present

## 2021-05-17 DIAGNOSIS — Z7189 Other specified counseling: Secondary | ICD-10-CM | POA: Diagnosis not present

## 2021-05-17 DIAGNOSIS — G9340 Encephalopathy, unspecified: Secondary | ICD-10-CM | POA: Diagnosis not present

## 2021-05-17 LAB — RESP PANEL BY RT-PCR (FLU A&B, COVID) ARPGX2
Influenza A by PCR: NEGATIVE
Influenza B by PCR: NEGATIVE
SARS Coronavirus 2 by RT PCR: NEGATIVE

## 2021-05-17 MED ORDER — CLOZAPINE 100 MG PO TABS
100.0000 mg | ORAL_TABLET | Freq: Every day | ORAL | Status: DC
  Filled 2021-05-17: qty 1

## 2021-05-17 MED ORDER — CLOZAPINE 25 MG PO TABS: 75.0000 mg | ORAL_TABLET | Freq: Every day | ORAL | Status: AC

## 2021-05-17 MED ORDER — ISOSORBIDE MONONITRATE ER 30 MG PO TB24: 30.0000 mg | ORAL_TABLET | Freq: Every day | ORAL | Status: AC

## 2021-05-17 MED ORDER — LEVALBUTEROL HCL 1.25 MG/0.5ML IN NEBU: 1.2500 mg | INHALATION_SOLUTION | Freq: Four times a day (QID) | RESPIRATORY_TRACT | 12 refills | Status: AC | PRN

## 2021-05-17 MED ORDER — FINASTERIDE 5 MG PO TABS: 5.0000 mg | ORAL_TABLET | Freq: Every day | ORAL | Status: AC

## 2021-05-17 NOTE — Progress Notes (Signed)
Report called by this RN to [facility] to [employer]. All questions answered about patient with Brandon [name]. Patient updated by this RN that he is 3rd on EMS list for transport. Patient belongings packed up by this RN and ready for transport. Vitals WDL at this time.

## 2021-05-17 NOTE — Progress Notes (Signed)
Palliative:  HPI: 65 y.o. male  with past medical history of schizoaffective disorder, CAD, HTN, COPD, acute hyponatremia, BPH, acute respiratory failure with hypoxia and hypercapnia, and acute metabolic encephalopathy admitted on 04/28/2021 with hypoxia and hypotension from SN[facility]. This is his 6th admission in 2022. Pt has been intubated and self extubated 9/24. Plan is to d/c to SNF for rehab. PMT consulted to discussion Brandon [name].  Met today with Brandon [name] and brother Brandon [name] at bedside. We discussed plan of care - discussed that EEG was negative. Discussed that antibiotics were discontinued.  Brandon [name] is trying to write today - some words are discernable and some are not.   I attempted to go over a MOST form with Brandon [name] and Brandon [name]. Initially as we go over it, Brandon [name] shares that he wants to reverse to code status to full code - he writes "I want to live". We discuss this at length. Encouraged Brandon [name] to consider DNR/DNI status understanding evidenced based poor outcomes in similar hospitalized patients, as the cause of the arrest is likely associated with chronic/terminal disease rather than a reversible acute cardio-pulmonary event. Brandon [name] agrees.   We discussed continued aggressive medical path and I explained that I feared we would require rehospitalization sooner rather than later. We discussed alternative of comfort focused care with support of hospice at facility. Brandon [name] takes time to process this and asks many questions about this. He asks many times if we can skip the MOST form. After extended discussion, I ask Brandon [name] if he agrees to go to facility with hospice and maintain DNR - he circles "yes" on his alphabet board several times. He then indicates he would like to review his discharge medications so we do. He asks me if he will be given pain medication. I ask him if he is in pain and he tells me no. I tell him that if he does have pain his pain will be treated.   During above conversation Brandon [name]  does indicate he would never want a feeding tube. I told him I would include this in his medical record.  Brandon [name] has good understanding of trajectory and high risk of further complications that can lead to further decline. Brandon [name] does not want his brother to suffer. He wishes to continue with treatments to optimize health.  All questions/concerns addressed. Emotional support provided.   Exam: Alert - difficult to assess orientation - writes some but some of it is only scribbles. Attempts to mouth words but unable to interpret. No distress. Breathing unlabored. Abd soft, flat.   Plan: - DNR - dc to SNF with hospice - attempted to complete MOST form however patient declined  6 minutes  Vevay, DNP, [facility] Palliative Medicine Team Team Phone # [phone]  Pager # [phone]  Greater than 50%  of this time was spent counseling and coordinating care related to the above assessment and plan.

## 2021-05-17 NOTE — Evaluation (Signed)
Occupational Therapy Re-Evaluation Patient Details Name: Brandon [name] MRN: [id number] DOB: 1955-12-06 Today's Date: 05/17/2021   History of Present Illness Pt is a 65 year old male with PMH including schizophrenia, seizures (ETOH withdrawal related), and ETOH abuse presenting to [facility] ED on 04/28/2021 from [facility] via EMS.  Staff at [facility] called EMS after patient was found to be unresponsive and reportedly hypotensive in the 50s for an unknown amount of time.  At [facility] patient became hypoxic with SPO2 in the 70s and was too confused to safely tolerate BiPAP requiring emergent intubation and mechanical ventilatory support. Pt was eventully extubated but needed to be reintubated 9/20 and was again extubated 9/21.  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   Clinical Impression   Brandon [name] was seen for OT re-evaluation on this date 2/2 prolonged hospitalization and meeting goals. Upon arrival to room pt reclined in bed, eager to participate. Family in to join session, updated on pt progress with therapy. Pt requires MAX A don B socks seated EOB. Improved to MOD A x2 + RW sit<>stand at EOB, able to advance RLE 1 step but not LLE. Pt making good progress, goals updated. Pt continues to benefit from skilled OT services to maximize return to PLOF and minimize risk of future falls, injury, caregiver burden, and readmission. Will continue to follow POC. Discharge recommendation remains appropriate.        Recommendations for follow up therapy are one component of a multi-disciplinary discharge planning process, led by the attending physician.  Recommendations may be updated based on patient  status, additional functional criteria and insurance authorization.   Follow Up Recommendations  SNF;Supervision/Assistance - 24 hour    Equipment Recommendations  Hospital bed    Recommendations for Other Services       Precautions / Restrictions Precautions Precautions: Fall Restrictions Weight Bearing Restrictions: No      Mobility Bed Mobility Overal bed mobility: Needs Assistance Bed Mobility: Supine to Sit;Sit to Supine Supine to sit: Max assist;+2 for physical assistance Sit to supine: Max assist;+2 for physical assistance General bed mobility comments: Assist required for BLE and trunk control    Transfers Overall transfer level: Needs assistance Equipment used: Rolling walker (2 wheeled) Transfers: Sit to/from Stand Sit to Stand: +2 physical assistance;Mod assist;From elevated surface         General transfer comment: Mod verbal cues for increased trunk flexion with feet blocked in proper position    Balance Overall balance assessment: Needs assistance Sitting-balance support: Feet supported;Bilateral upper extremity supported Sitting balance-[name] Scale: Poor Sitting balance - Comments: MIN A for L lateral lean Postural control: Left lateral lean Standing balance support: Bilateral upper extremity supported Standing balance-[name] Scale: Poor                           ADL either performed or assessed with clinical judgement   ADL Overall ADL's : Needs assistance/impaired                                       General ADL Comments: MAX A don B socks seated EOB. MOD A x2 + RW for ADL t/f  Pertinent Vitals/Pain Pain Assessment: Faces Pain Score: 7  Faces Pain Scale: Hurts a little bit Pain Location: bottom Pain Descriptors / Indicators: Sore Pain Intervention(s): Limited activity within patient's tolerance;Repositioned              Cognition Arousal/Alertness: Awake/alert Behavior During Therapy: WFL for tasks  assessed/performed Overall Cognitive Status: Within Functional Limits for tasks assessed                                     General Comments       Exercises Exercises: Other exercises Other Exercises Other Exercises: Pt and family educated re: OT role, DME recs, d/c recs, falls prevention, HEP Other Exercises: LBD, sup<>sit, sit<>stand, sitting/standing balance/tolerance     OT Goals(Current goals can be found in the care plan section) Acute Rehab OT Goals Patient Stated Goal: to have more therapy OT Goal Formulation: With patient Time For Goal Achievement: 05/31/21 Potential to Achieve Goals: Good ADL Goals Pt Will Perform Grooming: with set-up;with supervision;sitting Pt Will Perform Lower Body Dressing: with mod assist;sitting/lateral leans;with adaptive equipment Pt Will Transfer to Toilet: with mod assist;stand pivot transfer;bedside commode (c LRAD PRN) Pt Will Perform Toileting - Clothing Manipulation and hygiene: with min assist;sitting/lateral leans Pt/caregiver will Perform Home Exercise Program: Increased strength;Both right and left upper extremity;With theraband;With written HEP provided;With Supervision  OT Frequency: Min 2X/week    AM-PAC OT "6 Clicks" Daily Activity     Outcome Measure Help from another person eating meals?: A Little Help from another person taking care of personal grooming?: A Little Help from another person toileting, which includes using toliet, bedpan, or urinal?: A Lot Help from another person bathing (including washing, rinsing, drying)?: A Lot Help from another person to put on and taking off regular upper body clothing?: A Little Help from another person to put on and taking off regular lower body clothing?: A Lot 6 Click Score: 15   End of Session Equipment Utilized During Treatment: Oxygen Nurse Communication: Mobility status  Activity Tolerance: Patient tolerated treatment well Patient left: in bed;with call  bell/phone within reach;with bed alarm set;with family/visitor present  OT Visit Diagnosis: Unsteadiness on feet (R26.81);Muscle weakness (generalized) (M62.81)                Time: 3016-0109 OT Time Calculation (min): 22 min Charges:  OT General Charges $OT Visit: 1 Visit OT Evaluation $OT Re-eval: 1 Re-eval OT Treatments $Self Care/Home Management : 8-22 mins  Kathie Dike, M.S. OTR/L  05/17/21, 12:44 PM  ascom [phone]

## 2021-05-17 NOTE — Progress Notes (Signed)
Physical Therapy Treatment Patient Details Name: Brandon [name] MRN: [id number] DOB: [birth year] Today's Date: 05/17/2021   History of Present Illness Pt is a 65 year old male with PMH including schizophrenia, seizures (ETOH withdrawal related), and ETOH abuse presenting to [facility] ED on 04/28/2021 from [facility] via EMS.  Staff at [facility] called EMS after patient was found to be unresponsive and reportedly hypotensive in the 50s for an unknown amount of time.  At [facility] patient became hypoxic with SPO2 in the 70s and was too confused to safely tolerate BiPAP requiring emergent intubation and mechanical ventilatory support. Pt was eventully extubated but needed to be reintubated 9/20 and was again extubated 9/21.  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    PT Comments    Pt was pleasant and motivated to participate during the session. Pt continued to require heavy assist with bed mobility tasks but presented with grossly improved sitting balance at EOB requiring only occasional assist to prevent posterior LOB but no lateral LOB.  Pt was able to stand x 3 with mod A to stand and for stability once in standing. Pt was able to slightly move his RLE while in standing but could not accept enough weight onto his RLE to move his L foot.  Pt will benefit from PT services in a SNF setting upon discharge to safely address deficits listed in patient problem list for decreased caregiver assistance and eventual return to PLOF.     Recommendations for follow up therapy are one component of a multi-disciplinary discharge planning process, led by the attending physician.  Recommendations may be updated based  on patient status, additional functional criteria and insurance authorization.  Follow Up Recommendations  SNF;Supervision/Assistance - 24 hour     Equipment Recommendations  Other (comment) (TBD)    Recommendations for Other Services       Precautions / Restrictions Precautions Precautions: Fall Restrictions Weight Bearing Restrictions: No     Mobility  Bed Mobility Overal bed mobility: Needs Assistance   Rolling: Min assist Sidelying to sit: Mod assist;+2 for physical assistance     Sit to sidelying: Max assist;+2 for physical assistance General bed mobility comments: Assist required for BLE and trunk control    Transfers Overall transfer level: Needs assistance Equipment used: Rolling walker (2 wheeled) Transfers: Sit to/from Stand Sit to Stand: +2 physical assistance;Mod assist;From elevated surface         General transfer comment: Mod verbal cues for increased trunk flexion with feet blocked in proper position  Ambulation/Gait             General Gait Details: Pt able to slightly shuffle RLE but unable to move LLE   Stairs             Wheelchair Mobility    Modified [name] (Stroke Patients Only)       Balance Overall balance assessment: Needs assistance Sitting-balance support: Feet supported;Bilateral upper extremity supported Sitting balance-[name] Scale: Poor Sitting balance - Comments: Improved lateral stability but required occasional min A to prevent posterior LOB Postural control: Posterior lean Standing balance support: Bilateral upper extremity supported Standing balance-[name] Scale: Poor Standing balance comment: Min to mod A for static standing balance  Cognition Arousal/Alertness: Awake/alert Behavior During Therapy: WFL for tasks assessed/performed Overall Cognitive Status: Within Functional Limits for tasks assessed                                         Exercises Total Joint Exercises Ankle Circles/Pumps: AROM;Strengthening;Both;10 reps Quad Sets: Strengthening;Both;10 reps Gluteal Sets: Strengthening;Both;5 reps Heel Slides: AAROM;Strengthening;Both;10 reps Hip ABduction/ADduction: Both;Strengthening;AAROM;10 reps Other Exercises Other Exercises: L/R and A/P weight shifting in sitting Standing low amplitude mini squats x 5    General Comments        Pertinent Vitals/Pain Pain Assessment: 0-10 Pain Score: 7  Pain Location: back and bottom (spelled out on alphabet board) Pain Descriptors / Indicators: Sore Pain Intervention(s): Repositioned;Monitored during session;Patient requesting pain meds-RN notified    Home Living                      Prior Function            PT Goals (current goals can now be found in the care plan section) Progress towards PT goals: Progressing toward goals    Frequency    Min 2X/week      PT Plan Current plan remains appropriate    Co-evaluation              AM-PAC PT "6 Clicks" Mobility   Outcome Measure  Help needed turning from your back to your side while in a flat bed without using bedrails?: A Lot Help needed moving from lying on your back to sitting on the side of a flat bed without using bedrails?: Total Help needed moving to and from a bed to a chair (including a wheelchair)?: Total Help needed standing up from a chair using your arms (e.g., wheelchair or bedside chair)?: Total Help needed to walk in hospital room?: Total Help needed climbing 3-5 steps with a railing? : Total 6 Click Score: 7    End of Session Equipment Utilized During Treatment: Gait belt;Oxygen Activity Tolerance: Patient tolerated treatment well Patient left: in bed;with call bell/phone within reach;with bed alarm set Nurse Communication: Mobility status;Patient requests pain meds;Other (comment) (Redness and skin tear noted on pt's bottom, pt left in R sidelying) PT Visit Diagnosis:  Unsteadiness on feet (R26.81);Muscle weakness (generalized) (M62.81);Difficulty in walking, not elsewhere classified (R26.2)     Time: 8333-8329 PT Time Calculation (min) (ACUTE ONLY): 28 min  Charges:  $Therapeutic Exercise: 8-22 mins $Therapeutic Activity: 8-22 mins                    D. Scott Bama Hanselman PT, DPT 05/17/21, 9:33 AM

## 2021-05-17 NOTE — Consult Note (Signed)
[facility] Face-to-Face Psychiatry Consult   Reason for Consult: Follow-up consult for this gentleman with a history of schizoaffective disorder who has had an extended hospitalization.  Found patient awake.  He looked like he was trying to use his cell phone although as soon as I walked in he put it away and it was not clear that he was using it to make a call.  Patient moved his lips slightly but it was impossible for me to understand what he was trying to say.  He did not make any effort to use the pointing board that was in front of him.  May have shaken his head and nodded very slightly.  Denied being in any pain.  Reviewing the chart it looks like he is awaiting transport now to go back to skilled nursing facility.  Tolerating medicine well.  Current dose of Clozapine does not appear to be sedating or causing any problems Referring Physician: Chipper Herb Patient Identification: RAYVON DAKIN MRN:  [id number] Principal Diagnosis: Schizoaffective disorder, bipolar type (HCC) Diagnosis:  Principal Problem:   Schizoaffective disorder, bipolar type (HCC) Active Problems:   Coronary artery disease   Essential hypertension   COPD (chronic obstructive pulmonary disease) (HCC)   Acute hyponatremia   Right sided weakness   Schizophrenia (HCC)   BPH (benign prostatic hyperplasia)   Acute respiratory failure with hypoxia and hypercapnia (HCC)   Acute metabolic encephalopathy   Endotracheally intubated   On mechanically assisted ventilation (HCC)   Altered mental status   Palliative care by specialist   DNR (do not resuscitate)   Unresponsiveness   Dysarthria   Acute on chronic respiratory failure with hypoxia and hypercapnia (HCC)   Constipation   Weakness of both lower extremities   Pneumonia of left lower lobe due to infectious organism   Plantar fasciitis of right foot   Sinus tachycardia   Goals of care, counseling/discussion   Total Time spent with patient: 30 minutes  Subjective:   VENSON FERENCZ is a 65 y.o. male patient admitted with not able to communicate.  HPI: See my note that I dictated above.  Patient has been pretty stable in his presentation for at least a week.  The etiology of his loss of communication remains uncertain.  Past Psychiatric History: Past history of schizoaffective disorder  Risk to Self:   Risk to Others:   Prior Inpatient Therapy:   Prior Outpatient Therapy:    Past Medical History:  Past Medical History:  Diagnosis Date   Anemia    IDA   Colon polyps    COPD (chronic obstructive pulmonary disease) (HCC)    Coronary artery disease    Heart attack (HCC) 2005   Hiatal hernia    History of ETOH abuse    Hyperlipidemia    Hypertension    Pleurisy    Schizophrenia (HCC)    Schizophrenia (HCC)    Seizures (HCC)    grand mal in 2000 or 2001 recovery from alcoholism   Suicide attempt [facility])     Past Surgical History:  Procedure Laterality Date   ABDOMINAL SURGERY     internal bleeding   CHOLECYSTECTOMY N/A 05/04/2018   Procedure: LAPAROSCOPIC CHOLECYSTECTOMY, converted to open;  Surgeon: Sung Amabile, DO;  Location: [facility] ORS;  Service: General;  Laterality: N/A;   COLONOSCOPY     COLONOSCOPY WITH PROPOFOL N/A 05/09/2016   Procedure: COLONOSCOPY WITH PROPOFOL;  Surgeon: Scot Jun, MD;  Location: [facility] ENDOSCOPY;  Service: Endoscopy;  Laterality: N/A;  CORONARY ANGIOPLASTY WITH STENT PLACEMENT     1 vessel   ESOPHAGOGASTRODUODENOSCOPY (EGD) WITH PROPOFOL N/A 05/09/2016   Procedure: ESOPHAGOGASTRODUODENOSCOPY (EGD) WITH PROPOFOL;  Surgeon: Scot Jun, MD;  Location: [facility] ENDOSCOPY;  Service: Endoscopy;  Laterality: N/A;   EYE SURGERY Right    GLAUCOMA SURGERY     LAPAROSCOPIC APPENDECTOMY N/A 07/17/2018   Procedure: APPENDECTOMY LAPAROSCOPIC;  Surgeon: Sung Amabile, DO;  Location: [facility] ORS;  Service: General;  Laterality: N/A;   LEFT HEART CATH Right 10/19/2017   Procedure: Left Heart Cath and Coronary Angiography;  Surgeon:  Laurier Nancy, MD;  Location: [facility] INVASIVE CV LAB;  Service: Cardiovascular;  Laterality: Right;   NOSE SURGERY     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   VASECTOMY     Family History:  Family History  Problem Relation Age of Onset   Lung cancer Mother    Hypertension Father    Heart attack Father    CAD Father    Prostate cancer Neg Hx    Bladder Cancer Neg Hx    Kidney cancer Neg Hx    Family Psychiatric  History: See previous Social History:  Social History   Substance and Sexual Activity  Alcohol Use No   Comment: no alcohol since 2010     Social History   Substance and Sexual Activity  Drug Use No    Social History   Socioeconomic History   Marital status: Married    Spouse name: Not on file   Number of children: Not on file   Years of education: Not on file   Highest education level: Not on file  Occupational History   Not on file  Tobacco Use   Smoking status: Former    Packs/day: 1.00    Types: Cigarettes    Quit date: 2012    Years since quitting: 10.7   Smokeless tobacco: Former  Building services engineer Use: Never used  Substance and Sexual Activity   Alcohol use: No    Comment: no alcohol since 2010   Drug use: No   Sexual activity: Not Currently    Birth control/protection: None  Other Topics Concern   Not on file  Social History Narrative   ** Merged History Encounter **       Social Determinants of Health   Financial Resource Strain: Not on file  Food Insecurity: Not on file  Transportation Needs: Not on file  Physical Activity: Not on file  Stress: Not on file  Social Connections: Not on file   Additional Social History:    Allergies:   Allergies  Allergen Reactions   Compazine [Prochlorperazine] Other (See Comments)    Dystonic rxn - convulsions. Near fatal reaction.    Ivp Dye [Iodinated Diagnostic Agents] Shortness Of Breath    Pt states SOB after last IV contrast injection   Alcohol-Sulfur [Elemental Sulfur] Other (See  Comments)    History of alcoholism   Benadryl [Diphenhydramine] Other (See Comments)    "stuffy", nasal congestion   Depakote [Divalproex Sodium] Other (See Comments)    Cause elevated ammonia   Dramamine [Dimenhydrinate] Swelling   Plavix [Clopidogrel] Other (See Comments)    Rectal bleeding. "Perforated my intestines."   Rosuvastatin     Other reaction(s): Other (See Comments)    Labs:  Results for orders placed or performed during the hospital encounter of 04/28/21 (from the past 48 hour(s))  Resp Panel by RT-PCR (Flu A&B, Covid) Nasopharyngeal Swab  Status: None   Collection Time: 05/17/21 11:26 AM   Specimen: Nasopharyngeal Swab; Nasopharyngeal(NP) swabs in vial transport medium  Result Value Ref Range   SARS Coronavirus 2 by RT PCR NEGATIVE NEGATIVE    Comment: (NOTE) SARS-CoV-2 target nucleic acids are NOT DETECTED.  The SARS-CoV-2 RNA is generally detectable in upper respiratory specimens during the acute phase of infection. The lowest concentration of SARS-CoV-2 viral copies this assay can detect is 138 copies/mL. A negative result does not preclude SARS-Cov-2 infection and should not be used as the sole basis for treatment or other patient management decisions. A negative result may occur with  improper specimen collection/handling, submission of specimen other than nasopharyngeal swab, presence of viral mutation(s) within the areas targeted by this assay, and inadequate number of viral copies(<138 copies/mL). A negative result must be combined with clinical observations, patient history, and epidemiological information. The expected result is Negative.  Fact Sheet for Patients:  BloggerCourse.com  Fact Sheet for Healthcare Providers:  SeriousBroker.it  This test is no t yet approved or cleared by the [place] FDA and  has been authorized for detection and/or diagnosis of SARS-CoV-2 by FDA under an  Emergency Use Authorization (EUA). This EUA will remain  in effect (meaning this test can be used) for the duration of the COVID-19 declaration under Section 564(b)(1) of the Act, 21 U.S.C.section 360bbb-3(b)(1), unless the authorization is terminated  or revoked sooner.       Influenza A by PCR NEGATIVE NEGATIVE   Influenza B by PCR NEGATIVE NEGATIVE    Comment: (NOTE) The Xpert Xpress SARS-CoV-2/FLU/RSV plus assay is intended as an aid in the diagnosis of influenza from Nasopharyngeal swab specimens and should not be used as a sole basis for treatment. Nasal washings and aspirates are unacceptable for Xpert Xpress SARS-CoV-2/FLU/RSV testing.  Fact Sheet for Patients: BloggerCourse.com  Fact Sheet for Healthcare Providers: SeriousBroker.it  This test is not yet approved or cleared by the [place] FDA and has been authorized for detection and/or diagnosis of SARS-CoV-2 by FDA under an Emergency Use Authorization (EUA). This EUA will remain in effect (meaning this test can be used) for the duration of the COVID-19 declaration under Section 564(b)(1) of the Act, 21 U.S.C. section 360bbb-3(b)(1), unless the authorization is terminated or revoked.  Performed at [facility], [address] [place], [place] [address]     Current Facility-Administered Medications  Medication Dose Route Frequency Provider Last Rate Last Admin   0.9 %  sodium chloride infusion   Intravenous PRN Rust-Chester, Cecelia Byars, NP   Stopped at 05/04/21 1557   acetaminophen (TYLENOL) tablet 650 mg  650 mg Oral Q6H PRN Alford Highland, MD   650 mg at 05/17/21 0858   arformoterol (BROVANA) nebulizer solution 15 mcg  15 mcg Nebulization BID Martina Sinner, MD   15 mcg at 05/17/21 0715   aspirin chewable tablet 81 mg  81 mg Oral Daily Alford Highland, MD   81 mg at 05/17/21 0981   bisacodyl (DULCOLAX) suppository 10 mg  10 mg Rectal Daily PRN  Rust-Chester, Micheline Rough L, NP   10 mg at 05/03/21 2301   budesonide (PULMICORT) nebulizer solution 0.5 mg  0.5 mg Nebulization BID Salena Saner, MD   0.5 mg at 05/17/21 0715   buPROPion ER [facility] SR) 12 hr tablet 100 mg  100 mg Oral Daily Lianne Cure, NP   100 mg at 05/17/21 1914   cholecalciferol (VITAMIN D3) tablet 1,000 Units  1,000 Units  Oral Daily Mannam, Praveen, MD   1,000 Units at 05/17/21 0852   cloZAPine (CLOZARIL) tablet 100 mg  100 mg Oral QHS Antionne Enrique T, MD       diltiazem (CARDIZEM CD) 24 hr capsule 120 mg  120 mg Oral Daily Alford Highland, MD   120 mg at 05/17/21 8099   docusate sodium (COLACE) capsule 200 mg  200 mg Oral BID [name], MD   200 mg at 05/17/21 0852   dorzolamide-timolol (COSOPT) 22.3-6.8 MG/ML ophthalmic solution 1 drop  1 drop Both Eyes BID Rust-Chester, Britton L, NP   1 drop at 05/16/21 2317   enoxaparin (LOVENOX) injection 40 mg  40 mg Subcutaneous Q24H Rust-Chester, Britton L, NP   40 mg at 05/16/21 2313   ezetimibe (ZETIA) tablet 10 mg  10 mg Oral QHS Mannam, Praveen, MD   10 mg at 05/16/21 2312   finasteride (PROSCAR) tablet 5 mg  5 mg Oral Daily Alford Highland, MD   5 mg at 05/17/21 8338   hydrALAZINE (APRESOLINE) injection 10 mg  10 mg Intravenous Q6H PRN Martina Sinner, MD   10 mg at 05/01/21 2102   hydrOXYzine (ATARAX/VISTARIL) tablet 25 mg  25 mg Oral TID PRN Lianne Cure, NP   25 mg at 05/12/21 0926   isosorbide mononitrate (IMDUR) 24 hr tablet 30 mg  30 mg Oral Daily Wieting, Richard, MD   30 mg at 05/17/21 0852   lactulose (CHRONULAC) 10 GM/15ML solution 30 g  30 g Oral TID PRN Marrion Coy, MD       latanoprost (XALATAN) 0.005 % ophthalmic solution 1 drop  1 drop Both Eyes QHS Graves, Dana E, NP   1 drop at 05/16/21 2313   levalbuterol (XOPENEX) nebulizer solution 1.25 mg  1.25 mg Nebulization Q6H PRN Salena Saner, MD   1.25 mg at 05/07/21 2505   melatonin tablet 5 mg  5 mg Oral QHS Rust-Chester, Britton L, NP    5 mg at 05/16/21 2312   multivitamin with minerals tablet 1 tablet  1 tablet Oral Daily Mannam, Praveen, MD   1 tablet at 05/17/21 0852   nitroGLYCERIN (NITROSTAT) SL tablet 0.4 mg  0.4 mg Sublingual Q5 min PRN Lianne Cure, NP       oxyCODONE (Oxy IR/ROXICODONE) immediate release tablet 5 mg  5 mg Oral Q6H PRN Alford Highland, MD   5 mg at 05/15/21 2356   pantoprazole (PROTONIX) EC tablet 40 mg  40 mg Oral Daily Mannam, Praveen, MD   40 mg at 05/17/21 0852   polyethylene glycol (MIRALAX / GLYCOLAX) packet 17 g  17 g Oral Daily PRN Rust-Chester, Cecelia Byars, NP       revefenacin (YUPELRI) nebulizer solution 175 mcg  175 mcg Nebulization Daily Martina Sinner, MD   175 mcg at 05/17/21 0715   senna (SENOKOT) tablet 8.6-17.2 mg  1-2 tablet Oral Daily PRN Lianne Cure, NP   17.2 mg at 05/13/21 1140   tamsulosin (FLOMAX) capsule 0.4 mg  0.4 mg Oral QPC breakfast Alford Highland, MD   0.4 mg at 05/17/21 3976    Musculoskeletal: Strength & Muscle Tone: within normal limits Gait & Station: unsteady Patient leans: N/A            Psychiatric Specialty Exam:  Presentation  General Appearance:  No data recorded Eye Contact: No data recorded Speech: No data recorded Speech Volume: No data recorded Handedness: No data recorded  Mood and Affect  Mood:  No data recorded Affect: No data recorded  Thought Process  Thought Processes: No data recorded Descriptions of Associations:No data recorded Orientation:No data recorded Thought Content:No data recorded History of Schizophrenia/Schizoaffective disorder:No data recorded Duration of Psychotic Symptoms:No data recorded Hallucinations:No data recorded Ideas of Reference:No data recorded Suicidal Thoughts:No data recorded Homicidal Thoughts:No data recorded  Sensorium  Memory: No data recorded Judgment: No data recorded Insight: No data recorded  Executive Functions  Concentration: No data recorded Attention  Span: No data recorded Recall: No data recorded Fund of Knowledge: No data recorded Language: No data recorded  Psychomotor Activity  Psychomotor Activity: No data recorded  Assets  Assets: No data recorded  Sleep  Sleep: No data recorded  Physical Exam: Physical Exam Constitutional:      Appearance: Normal appearance.  HENT:     Head: Normocephalic and atraumatic.     Mouth/Throat:     Pharynx: Oropharynx is clear.  Eyes:     Pupils: Pupils are equal, round, and reactive to light.  Cardiovascular:     Rate and Rhythm: Normal rate and regular rhythm.  Pulmonary:     Effort: Pulmonary effort is normal.     Breath sounds: Normal breath sounds.  Abdominal:     General: Abdomen is flat.     Palpations: Abdomen is soft.  Musculoskeletal:        General: Normal range of motion.  Skin:    General: Skin is warm and dry.  Neurological:     General: No focal deficit present.     Mental Status: He is alert. Mental status is at baseline.  Psychiatric:        Attention and Perception: Attention normal.        Mood and Affect: Affect is blunt.        Speech: He is noncommunicative.        Behavior: Behavior is slowed and withdrawn.        Cognition and Memory: Cognition is impaired.   Review of Systems  Unable to perform ROS: Psychiatric disorder  Blood pressure 103/72, pulse 97, temperature 98.1 F (36.7 C), resp. rate 18, height 6' (1.829 m), weight 75.3 kg, SpO2 100 %. Body mass index is 22.5 kg/m.  Treatment Plan Summary: Medication management and Plan patient had still been on 75 mg of clozapine as of today.  I think that since he is clearly tolerating that okay it is a good idea to keep trying to increase it back towards the previously successful dose which was 150 mg.  I have put in an order to increase the dose to 100 mg.  Not sure if this will make it onto his record in time for discharge.  Advised the patient of overall my recommendation to keep going up on the  Clozapine.  Again unfortunately he was not really able to make any reply.  Disposition: No evidence of imminent risk to self or others at present.   Patient does not meet criteria for psychiatric inpatient admission.  Mordecai Rasmussen, MD 05/17/2021 4:41 PM

## 2021-05-17 NOTE — Discharge Summary (Addendum)
Physician Discharge Summary  Patient ID: Brandon [name] MRN: [id number] DOB/AGE: [birth year] 65 y.o.  Admit date: 04/28/2021 Discharge date: 05/17/2021  Admission Diagnoses:  Discharge Diagnoses:  Principal Problem:   Schizoaffective disorder, bipolar type [facility]) Active Problems:   Coronary artery disease   Essential hypertension   COPD (chronic obstructive pulmonary disease) (HCC)   Acute hyponatremia   Right sided weakness   Schizophrenia (HCC)   BPH (benign prostatic hyperplasia)   Acute respiratory failure with hypoxia and hypercapnia (HCC)   Acute metabolic encephalopathy   Endotracheally intubated   On mechanically assisted ventilation (HCC)   Altered mental status   Palliative care by specialist   DNR (do not resuscitate)   Unresponsiveness   Dysarthria   Acute on chronic respiratory failure with hypoxia and hypercapnia (HCC)   Constipation   Weakness of both lower extremities   Pneumonia of left lower lobe due to infectious organism   Plantar fasciitis of right foot   Sinus tachycardia   Goals of care, counseling/discussion   Discharged Condition: poor  Hospital Course:  65 year old man history of COPD chronic hypoxic respiratory failure, CAD, hyperlipidemia hypertension schizophrenia and seizure and hyponatremia.  Initially admitted 15 days ago under the critical care service and was intubated for acute hypoxic respiratory failure and also hypotensive.  Patient also had a sodium of 122.  The patient has been unable to speak since being extubated.  ENT seen him  but he refused laryngoscope.  MRI of the brain did show T2 and flair hyperintensity in the [place] bilaterally which may be areas of recent ischemia which could correlate with hypoxia episode.  The patient has had bilateral leg weakness.  Restarted antibiotics on 05/12/2021 secondary to pneumonia on chest x-ray.   #1.  Dysarthria. Acute stroke. MRI ischemic changes appear to be recent while in the  hospital. EEG did not show acute changes. Patient was able to speak today, he has a very weak voice, almost whispering.  But he was able to speak a few words.  Dysarthria could be from his vocal cord dysfunction.  Patient has been seen by ENT, refused laryngoscopy.   2.  Left lower lobe pneumonia. Procalcitonin level still less than 0.1, will discontinue antibiotics. However, patient appears to have recurrent worsening short of breath while in the hospital, consistent with frequent aspiration.  Patient probably has significant vocal cord dysfunction, but patient has refused laryngoscopy.  Patient also has been seen by palliative care due to poor prognosis.  At this point, patient will be discharged to SNF under hospice care.  3.  Right foot pain. Discontinue steroids, condition improving.  4.  Acute on chronic respiratory failure with hypoxemia and hypercapnia. Condition had initially improved, currently patient does not have significant risk for PE. But he seems having more shortness of breath, most likely due to aspiration.   5.  Schizophrenia. Clozapine restarted at 75mg    Consults: cardiology  Significant Diagnostic Studies:   Treatments: Intubation, antibiotics, steroids  Discharge Exam: Blood pressure 96/74, pulse 98, temperature 98.1 F (36.7 C), resp. rate 17, height 6' (1.829 m), weight 75.3 kg, SpO2 98 %. General appearance: alert, cooperative, and dysarthria Resp: Decreased breathing sounds without crackles. Cardio: regular rate and rhythm, S1, S2 normal, no murmur, click, rub or gallop GI: soft, non-tender; bowel sounds normal; no masses,  no organomegaly Extremities: extremities normal, atraumatic, no cyanosis or edema  Disposition: Discharge disposition: 03-Skilled Nursing Facility       Discharge Instructions  Diet - low sodium heart healthy   Complete by: As directed    Increase activity slowly   Complete by: As directed       Allergies as of  05/17/2021       Reactions   Compazine [prochlorperazine] Other (See Comments)   Dystonic rxn - convulsions. Near fatal reaction.   Ivp Dye [iodinated Diagnostic Agents] Shortness Of Breath   Pt states SOB after last IV contrast injection   Alcohol-sulfur [elemental Sulfur] Other (See Comments)   History of alcoholism   Benadryl [diphenhydramine] Other (See Comments)   "stuffy", nasal congestion   Depakote [divalproex Sodium] Other (See Comments)   Cause elevated ammonia   Dramamine [dimenhydrinate] Swelling   Plavix [clopidogrel] Other (See Comments)   Rectal bleeding. "Perforated my intestines."   Rosuvastatin    Other reaction(s): Other (See Comments)        Medication List     STOP taking these medications    albuterol 108 (90 Base) MCG/ACT inhaler Commonly known as: VENTOLIN HFA   atorvastatin 20 MG tablet Commonly known as: LIPITOR   ezetimibe 10 MG tablet Commonly known as: ZETIA   olmesartan 20 MG tablet Commonly known as: BENICAR   sodium chloride 1 g tablet   Vitamin D3 25 MCG tablet Commonly known as: Vitamin D       TAKE these medications    acetaminophen 325 MG tablet Commonly known as: TYLENOL Take 650 mg by mouth every 4 (four) hours as needed for mild pain or moderate pain.   bisacodyl 5 MG EC tablet Commonly known as: DULCOLAX Take 5 mg by mouth once.   budesonide-formoterol 80-4.5 MCG/ACT inhaler Commonly known as: SYMBICORT Inhale 2 puffs into the lungs 2 (two) times daily.   buPROPion ER 100 MG 12 hr tablet Commonly known as: WELLBUTRIN SR Take 100 mg by mouth daily.   carvedilol 25 MG tablet Commonly known as: COREG Take 25 mg by mouth 2 (two) times daily with a meal.   cloZAPine 100 MG tablet Commonly known as: CLOZARIL Take 150 mg by mouth at bedtime.   docusate sodium 100 MG capsule Commonly known as: COLACE Take 1 capsule (100 mg total) by mouth 2 (two) times daily. Hold if has diarrhea   dorzolamide-timolol 22.3-6.8  MG/ML ophthalmic solution Commonly known as: COSOPT Place 1 drop into both eyes 2 (two) times daily.   finasteride 5 MG tablet Commonly known as: PROSCAR Take 1 tablet (5 mg total) by mouth daily. Start taking on: May 18, 2021   furosemide 20 MG tablet Commonly known as: LASIX Take 0.5 tablets (10 mg total) by mouth daily.   hydrOXYzine 25 MG capsule Commonly known as: VISTARIL Take 1 capsule (25 mg total) by mouth 3 (three) times daily as needed for anxiety.   isosorbide mononitrate 30 MG 24 hr tablet Commonly known as: IMDUR Take 1 tablet (30 mg total) by mouth daily. Start taking on: May 18, 2021 What changed: when to take this   latanoprost 0.005 % ophthalmic solution Commonly known as: XALATAN Place 1 drop into both eyes at bedtime.   levalbuterol 1.25 MG/0.5ML nebulizer solution Commonly known as: XOPENEX Take 1.25 mg by nebulization every 6 (six) hours as needed for wheezing or shortness of breath.   nitroGLYCERIN 0.4 MG SL tablet Commonly known as: NITROSTAT Place 0.4 mg under the tongue every 5 (five) minutes as needed for chest pain.   pantoprazole 40 MG tablet Commonly known as: PROTONIX Take 40 mg by mouth  2 (two) times daily.   polyethylene glycol 17 g packet Commonly known as: MIRALAX / GLYCOLAX Take 17 g by mouth daily.   senna 8.6 MG tablet Commonly known as: SENOKOT Take 1-2 tablets by mouth daily as needed for constipation.   tamsulosin 0.4 MG Caps capsule Commonly known as: FLOMAX Take 0.4 mg by mouth daily.        34 minutes Signed: Marrion Coy 05/17/2021, 12:19 PM

## 2021-05-17 NOTE — TOC Transition Note (Addendum)
Transition of Care [facility]) - CM/SW Discharge Note   Patient Details  Name: Brandon [name] MRN: [id number] Date of Birth: 1956-05-19  Transition of Care [name]) CM/SW Contact:  Gildardo Griffes, LCSW Phone Number: 05/17/2021, 1:23 PM   Clinical Narrative:     Patient will DC to: [place] Anticipated DC date: 05/17/21 Family notified: brother Probation officer byWendie Simmer  Per MD patient ready for DC to [facility] . RN, patient, patient's family, and facility notified of DC. Discharge Summary sent to facility, spoke with Delorise Shiner at facility who confirms patient can come, insurance auth number and covid vaccination status provided to [place]. RN given number for report  [phone] Room 104. DC packet on chart. Ambulance transport requested for patient.  CSW signing off.  Angeline Slim, LCSW    Final next level of care: Skilled Nursing Facility Barriers to Discharge: No Barriers Identified   Patient Goals and CMS Choice Patient states their goals for this hospitalization and ongoing recovery are:: to go home CMS Medicare.gov Compare Post Acute Care list provided to:: Patient Represenative (must comment) (brother Casimiro Needle) Choice offered to / list presented to : [facility] POA / Guardian  Discharge Placement              Patient chooses bed at:  [facility]) Patient to be transferred to facility by: [facility] Name of family member notified: Casimiro Needle brother Patient and family notified of of transfer: 05/17/21  Discharge Plan and Services In-house Referral: Clinical Social Work                                   Social Determinants of Health (SDOH) Interventions     Readmission Risk Interventions Readmission Risk Prevention Plan 04/17/2021 04/09/2021 03/24/2021  Transportation Screening Complete Complete Complete  PCP or Specialist Appt within 5-7 Days - - Complete  Home Care Screening - - Complete  Medication Review (RN CM) - - Complete  Medication Review Ocean[facility])  - Complete -  PCP or Specialist appointment within 3-5 days of discharge Complete Complete -  HRI or Home Care Consult Complete Complete -  SW Recovery Care/Counseling Consult Complete Complete -  Palliative Care Screening Not Applicable Not Applicable -  Skilled Nursing Facility Complete Complete -  Some recent data might be hidden

## 2021-05-18 DIAGNOSIS — D529 Folate deficiency anemia, unspecified: Secondary | ICD-10-CM | POA: Diagnosis not present

## 2021-05-18 DIAGNOSIS — E559 Vitamin D deficiency, unspecified: Secondary | ICD-10-CM | POA: Diagnosis not present

## 2021-05-18 DIAGNOSIS — D519 Vitamin B12 deficiency anemia, unspecified: Secondary | ICD-10-CM | POA: Diagnosis not present

## 2021-05-18 DIAGNOSIS — E119 Type 2 diabetes mellitus without complications: Secondary | ICD-10-CM | POA: Diagnosis not present

## 2021-06-11 DEATH — deceased

## 2022-06-09 ENCOUNTER — Encounter (INDEPENDENT_AMBULATORY_CARE_PROVIDER_SITE_OTHER): Payer: Self-pay

## 2022-11-10 IMAGING — CT CT ABD-PELV W/O CM
2 of 3 series · 15 of 46 positions shown, 17 images · non-contrast
Comparison: Noncontrast CT 09/02/2018

CLINICAL DATA: Acute nonlocalized abdominal pain.

EXAM:
CT ABDOMEN AND PELVIS WITHOUT CONTRAST
TECHNIQUE: Multidetector CT imaging of the abdomen and pelvis was performed
following the standard protocol without IV contrast.

[Series 2: routine abd/pel wo · axial · 0.86mm/px · z∈[-1033,-548]mm · 12 of 113 slices shown, 14 images]
[im 8/113  soft-tissue]
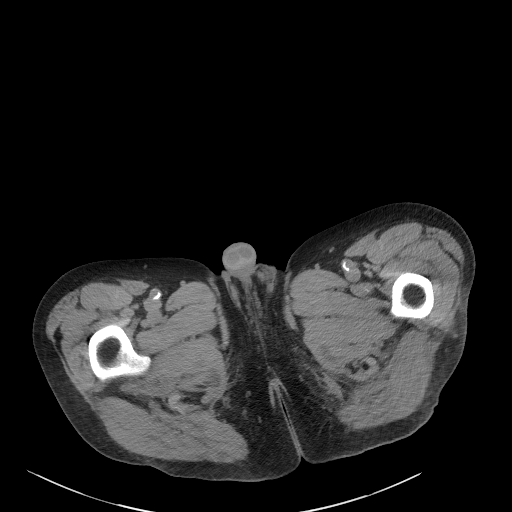
[im 8/113  bone]
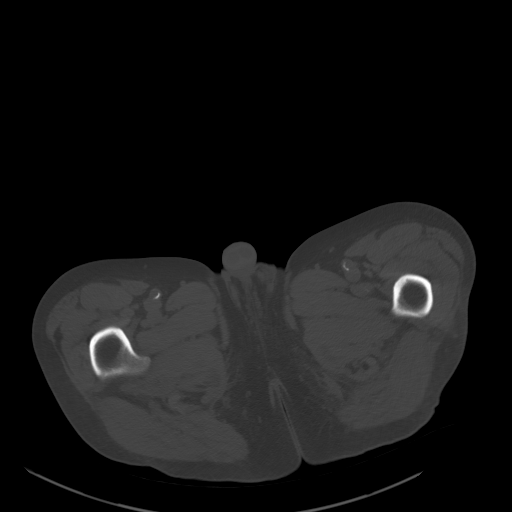
[im 15/113  soft-tissue]
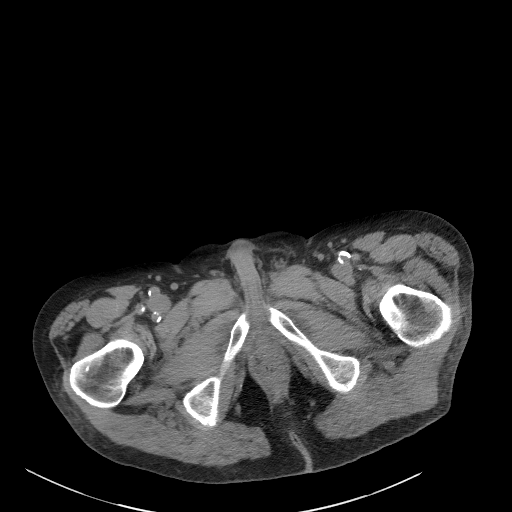
[im 26/113  soft-tissue]
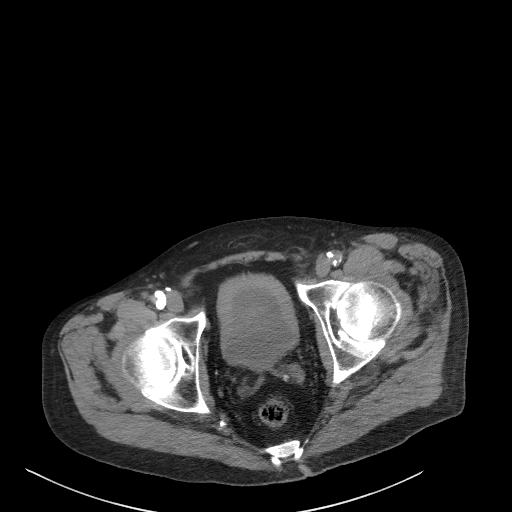
[im 33/113  soft-tissue]
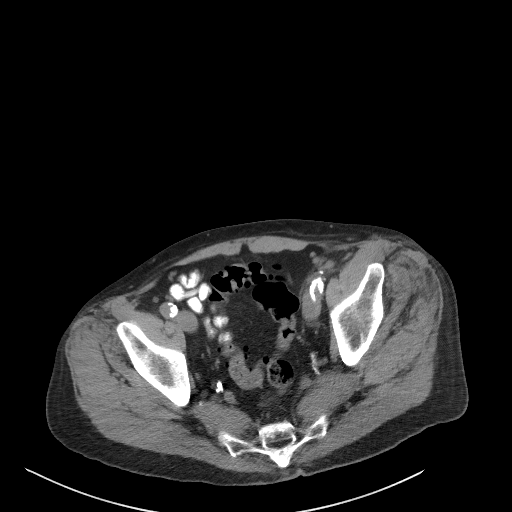
[im 44/113  soft-tissue]
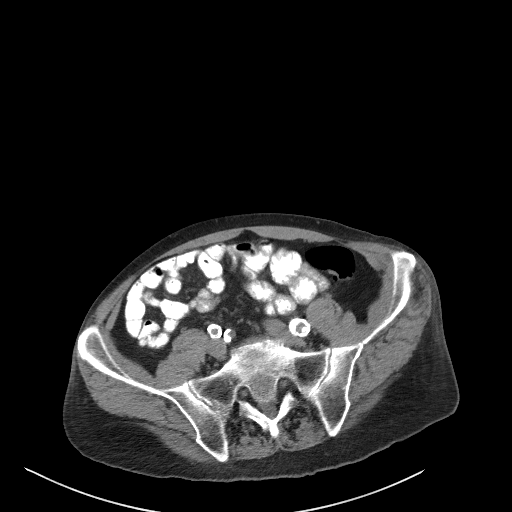
[im 51/113  soft-tissue]
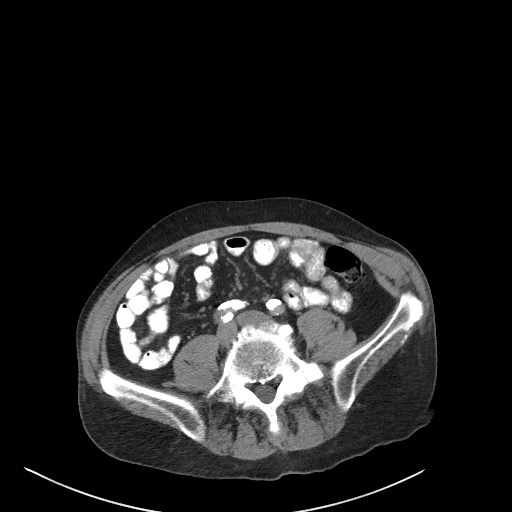
[im 62/113  soft-tissue]
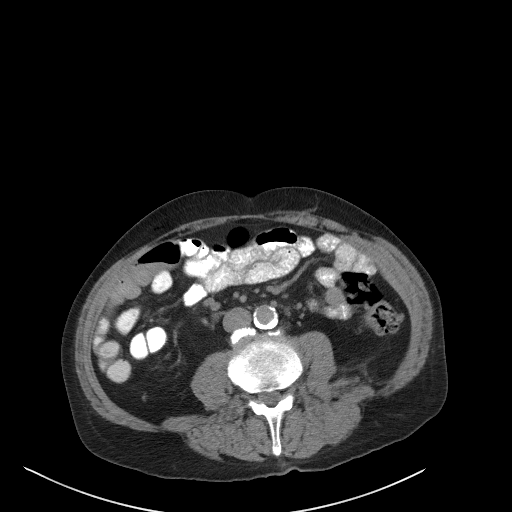
[im 69/113  soft-tissue]
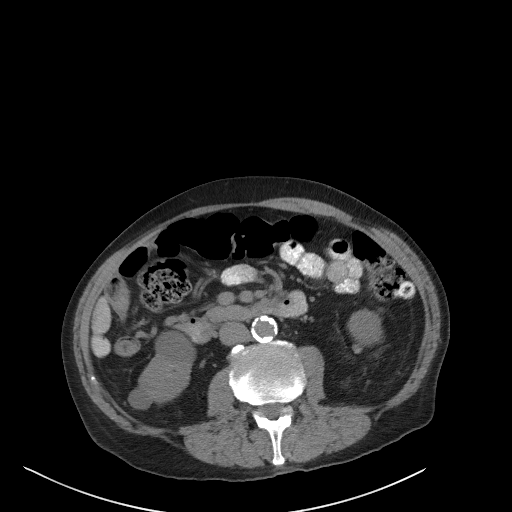
[im 80/113  soft-tissue]
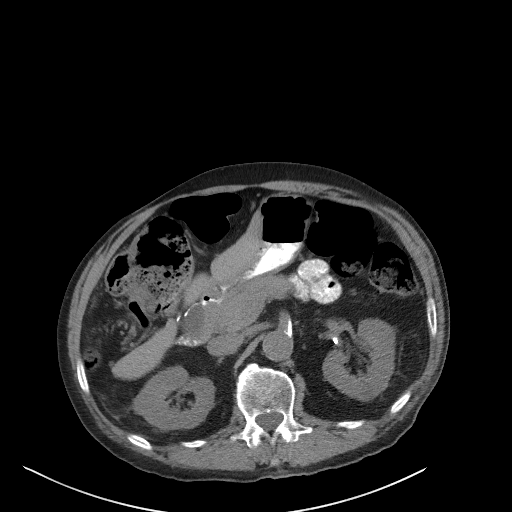
[im 80/113  bone]
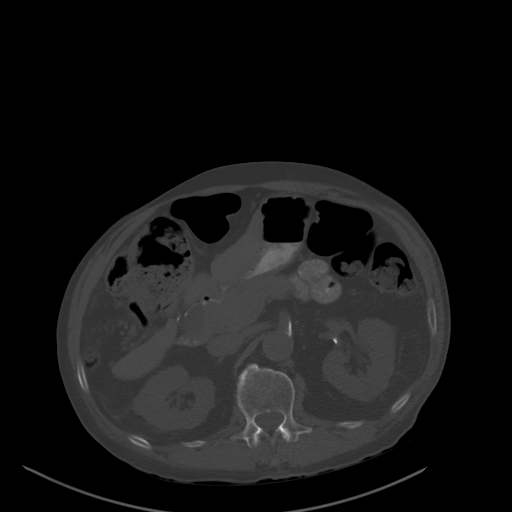
[im 87/113  soft-tissue]
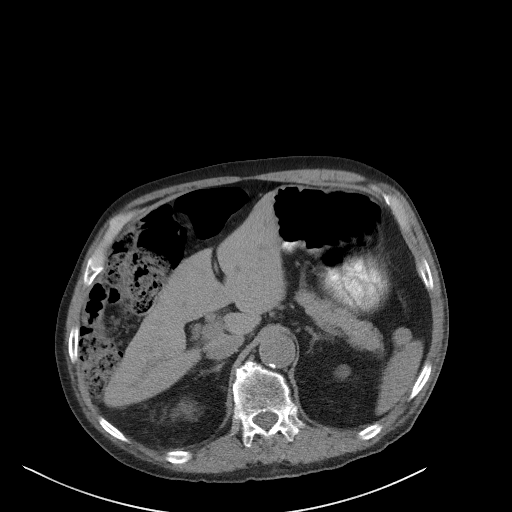
[im 98/113  soft-tissue]
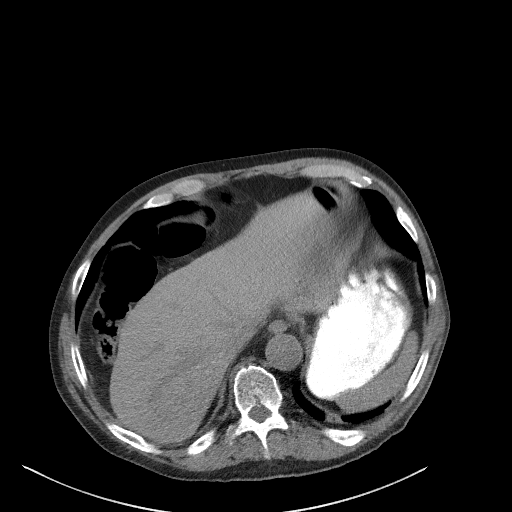
[im 105/113  soft-tissue]
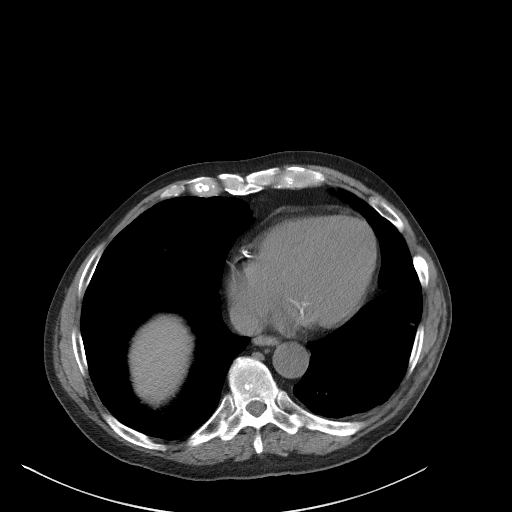

[Series 5: coronal st · coronal · 0.68mm/px · 3 of 95 slices shown]
[im 32/95  soft-tissue]
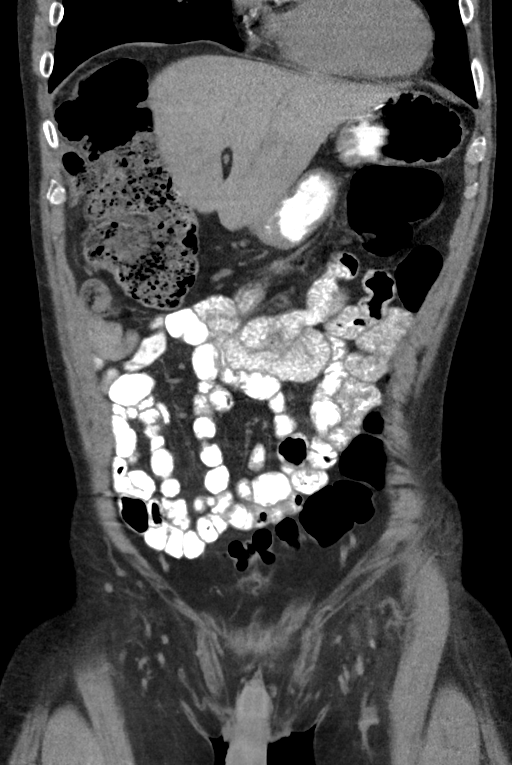
[im 42/95  soft-tissue]
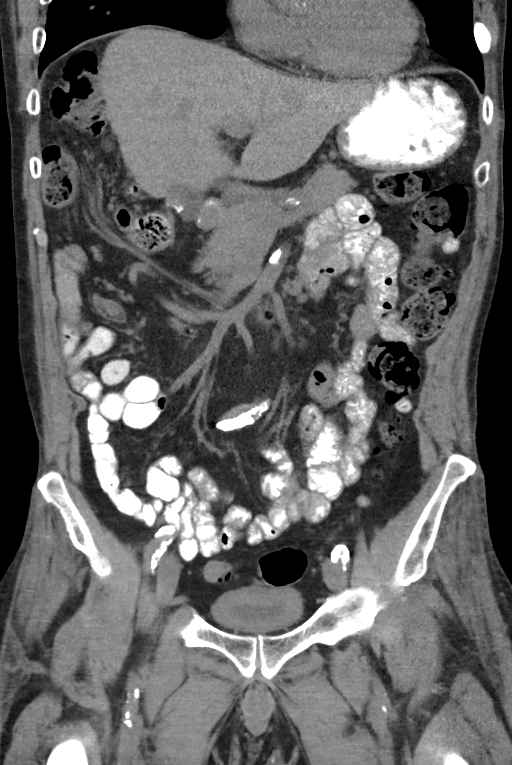
[im 53/95  soft-tissue]
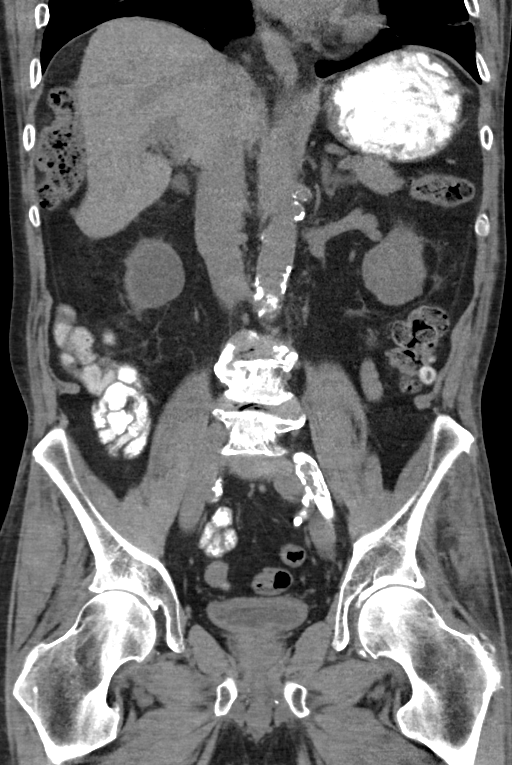

[15 of 46 positions shown; findings below may reference images not displayed]

FINDINGS: Lower chest: Linear atelectasis in the left greater than right lower
lobe. Normal heart size with coronary artery calcifications. Trace
pericardial fluid anteriorly.

Hepatobiliary: Slight capsular nodularity of lower contours again
seen. No discrete focal hepatic lesion on this unenhanced exam.
Patient is post cholecystectomy. Fluid density structure in the
gallbladder fossa similar to prior and may represent a chronic
seroma or gallbladder remnant. No internal gallstones or
inflammation. There is no biliary dilatation.

Pancreas: No ductal dilatation or inflammation.

Spleen: Normal in size without focal abnormality. Splenule
anteriorly.

Adrenals/Urinary Tract: No adrenal nodule. There is symmetric
bilateral perinephric edema, chronic but slightly progressed from
prior exam. No hydronephrosis. No renal calculi. Multiple bilateral
renal cysts. 7 mm hyperdensity arising from the lower left kidney is
at site of prior simple cyst and most consistent with intervening
hemorrhage or complexity. Ureters are decompressed. No evidence of
solid lesion.urinary bladder is minimally distended but thick
walled. Wall thickening is most prominent about the dome. Similar
findings were seen on prior exam.

Stomach/Bowel: Stomach physiologically distended by enteric
contrast. Normal small bowel without obstruction or inflammation.
High-riding cecum in the right upper quadrant. The appendix is
surgically absent with suture line at the base of the cecum. Colonic
interposition under the right hemidiaphragm in coursing lateral to
the liver, stable from prior exam. Moderate volume of colonic stool.
Diverticulosis from the distal transverse through the sigmoid. No
diverticulitis. No colonic inflammation.

Vascular/Lymphatic: Advanced aortic and branch atherosclerosis. No
aortic aneurysm. No portal venous or mesenteric gas. No
abdominopelvic adenopathy.

Reproductive: Mildly prominent prostate gland.

Other: No ascites or free air.

Musculoskeletal: Bones are under mineralized. Degenerative change in
the spine. There are no acute or suspicious osseous abnormalities.
IMPRESSION: 1. No acute findings in the abdomen pelvis.
2. Similar anterior bladder wall thickening to prior exam from 7575,
presumably chronic.
3. Colonic diverticulosis without diverticulitis.
4. Slight capsular nodularity of lower liver contours, can be seen
with cirrhosis.
5. Bilateral renal cysts, including a small hyperdense cyst in the
lower left kidney.

Aortic Atherosclerosis ([id number]).
